# Patient Record
Sex: Male | Born: 1935 | Race: Black or African American | Hispanic: No | Marital: Married | Smoking: Never smoker
Health system: Southern US, Community
[De-identification: ages and names within clinical notes are randomized; demographics above are authoritative.]

## PROBLEM LIST (undated history)

## (undated) DIAGNOSIS — J189 Pneumonia, unspecified organism: Secondary | ICD-10-CM

## (undated) DIAGNOSIS — H9190 Unspecified hearing loss, unspecified ear: Secondary | ICD-10-CM

## (undated) DIAGNOSIS — I214 Non-ST elevation (NSTEMI) myocardial infarction: Secondary | ICD-10-CM

## (undated) DIAGNOSIS — I2510 Atherosclerotic heart disease of native coronary artery without angina pectoris: Secondary | ICD-10-CM

## (undated) DIAGNOSIS — K5792 Diverticulitis of intestine, part unspecified, without perforation or abscess without bleeding: Secondary | ICD-10-CM

## (undated) DIAGNOSIS — I10 Essential (primary) hypertension: Secondary | ICD-10-CM

## (undated) DIAGNOSIS — E876 Hypokalemia: Secondary | ICD-10-CM

## (undated) DIAGNOSIS — F0280 Dementia in other diseases classified elsewhere without behavioral disturbance: Secondary | ICD-10-CM

## (undated) DIAGNOSIS — G9340 Encephalopathy, unspecified: Secondary | ICD-10-CM

## (undated) HISTORY — PX: ANGIOPLASTY: SHX39

## (undated) HISTORY — DX: Encephalopathy, unspecified: G93.40

## (undated) HISTORY — PX: TONSILLECTOMY: SUR1361

## (undated) HISTORY — PX: OTHER SURGICAL HISTORY: SHX169

## (undated) HISTORY — DX: Pneumonia, unspecified organism: J18.9

## (undated) HISTORY — DX: Hypokalemia: E87.6

---

## 1998-02-11 ENCOUNTER — Ambulatory Visit (HOSPITAL_COMMUNITY): Admission: RE | Admit: 1998-02-11 | Discharge: 1998-02-11 | Payer: Self-pay | Admitting: Cardiology

## 1998-04-12 ENCOUNTER — Emergency Department (HOSPITAL_COMMUNITY): Admission: EM | Admit: 1998-04-12 | Discharge: 1998-04-12 | Payer: Self-pay

## 1998-05-06 ENCOUNTER — Ambulatory Visit (HOSPITAL_COMMUNITY): Admission: RE | Admit: 1998-05-06 | Discharge: 1998-05-06 | Payer: Self-pay | Admitting: Cardiology

## 1999-10-18 ENCOUNTER — Ambulatory Visit (HOSPITAL_COMMUNITY): Admission: RE | Admit: 1999-10-18 | Discharge: 1999-10-18 | Payer: Self-pay | Admitting: Cardiology

## 1999-10-18 ENCOUNTER — Encounter: Payer: Self-pay | Admitting: Cardiology

## 2002-01-17 ENCOUNTER — Encounter: Admission: RE | Admit: 2002-01-17 | Discharge: 2002-01-17 | Payer: Self-pay | Admitting: Cardiology

## 2002-01-17 ENCOUNTER — Encounter: Payer: Self-pay | Admitting: Cardiology

## 2002-02-25 ENCOUNTER — Encounter: Payer: Self-pay | Admitting: Cardiology

## 2002-02-25 ENCOUNTER — Ambulatory Visit (HOSPITAL_COMMUNITY): Admission: RE | Admit: 2002-02-25 | Discharge: 2002-02-25 | Payer: Self-pay | Admitting: Cardiology

## 2002-02-28 ENCOUNTER — Inpatient Hospital Stay (HOSPITAL_COMMUNITY): Admission: AD | Admit: 2002-02-28 | Discharge: 2002-03-02 | Payer: Self-pay | Admitting: Cardiology

## 2002-03-25 ENCOUNTER — Encounter (HOSPITAL_COMMUNITY): Admission: RE | Admit: 2002-03-25 | Discharge: 2002-05-25 | Payer: Self-pay | Admitting: Cardiology

## 2004-10-12 ENCOUNTER — Ambulatory Visit (HOSPITAL_COMMUNITY): Admission: RE | Admit: 2004-10-12 | Discharge: 2004-10-12 | Payer: Self-pay | Admitting: Cardiology

## 2005-05-10 ENCOUNTER — Emergency Department (HOSPITAL_COMMUNITY): Admission: EM | Admit: 2005-05-10 | Discharge: 2005-05-10 | Payer: Self-pay | Admitting: Emergency Medicine

## 2005-07-31 ENCOUNTER — Emergency Department (HOSPITAL_COMMUNITY): Admission: EM | Admit: 2005-07-31 | Discharge: 2005-07-31 | Payer: Self-pay | Admitting: Family Medicine

## 2008-01-28 ENCOUNTER — Ambulatory Visit (HOSPITAL_COMMUNITY): Admission: RE | Admit: 2008-01-28 | Discharge: 2008-01-28 | Payer: Self-pay | Admitting: Cardiology

## 2008-06-26 ENCOUNTER — Emergency Department (HOSPITAL_COMMUNITY): Admission: EM | Admit: 2008-06-26 | Discharge: 2008-06-26 | Payer: Self-pay | Admitting: Unknown Physician Specialty

## 2010-06-22 ENCOUNTER — Ambulatory Visit: Admit: 2010-06-22 | Payer: Self-pay | Admitting: Cardiology

## 2010-08-01 ENCOUNTER — Encounter: Payer: Self-pay | Admitting: Otolaryngology

## 2010-11-26 NOTE — Cardiovascular Report (Signed)
NAME:  Alexander Donaldson, Alexander Donaldson                         ACCOUNT NO.:  [id number]   MEDICAL RECORD NO.:  [id number]                   PATIENT TYPE:  OIB   LOCATION:  2034                                 FACILITY:  [facility]   PHYSICIAN:  Robynn Pane, M.D.               DATE OF BIRTH:  February 07, 1934   DATE OF PROCEDURE:  02/28/2002  DATE OF DISCHARGE:  03/02/2002                              CARDIAC CATHETERIZATION   PROCEDURE:  1. Left cardiac catheterization with selective left and right coronary     angiography, left ventriculography angiography via right groin using     Judkins technique.  2. Successful percutaneous transluminal coronary angioplasty to obtuse     marginal #3 using 2.5 x 10-mm long CrossSail balloon.  3. Successful deployment of 3.0 x 13-mm long CYPHER drug-eluting stent in     proximal obtuse marginal #3.  4. Successful percutaneous transluminal coronary angioplasty to mid and     distal junction and distal right coronary artery using 1.5 x 15-mm long     Maverick balloon and then 2.5 x 20-mm long CrossSail balloon for     predilatation.  5. Successful deployment of 2.5 x 23-mm long CYPHER drug-eluting stent in     distal right coronary artery.  6. Successful deployment of 3.0 x 23-mm long CYPHER drug-eluting stent in     mid and distal junction of right coronary artery.   INDICATIONS FOR PROCEDURE:  The patient is a 77 year old black male with a  past medical history significant for hypertension, diabetes mellitus,  hypercholesterolemia, complains of occasional palpitations and feeling weak  and tired.  The patient denies any chest pain but complains of exertional  dyspnea and states had chest pain approximately two years ago and  subsequently had stress test which was normal.  The patient underwent stress  Cardiolite on February 25, 2002.  He exercised for 6.45 minutes on Bruce  protocol, complained of fatigue and shortness of breath.  EKG showed up to 2  mm ST depression  in inferolateral leads which reverted to a baseline after  sublingual nitroglycerin and IV Lopressor.  Cardiolite scan showed focal  ischemia in the inferoapical wall with scar in the inferior wall with EF of  40%.  Due to exertional dyspnea, weakness which was probably anginal  equivalent, and strongly positive stress Cardiolite, the patient was advised  for left catheterization, possible PTCA stenting.   DESCRIPTION OF PROCEDURE:  After obtaining the informed consent, the patient  was brought to the catheterization lab and was placed on the fluoroscopy  table.  The right groin was prepped and draped in the usual fashion.  Xylocaine, 2%, was used for local anesthesia in the right groin.  With the  help of a thin-walled needle, a #6 French arterial sheath was placed.  The  sheath was aspirated and flushed.  Next a #6 French left Judkins catheter  was advanced over  the wire under fluoroscopic guidance up to the ascending  aorta.  The wire was pulled out.  The catheter was aspirated and connected  to the manifold.  The catheter was further advanced and engaged into the  left coronary ostium.  Multiple views of the left system were taken.  Next  the catheter was disengaged and was pulled out over the wire and was  replaced with a #6 [place] right Judkins catheter which was advanced over the  wire under fluoroscopic guidance up to the ascending aorta.  The wire was  pulled out.  The catheter was aspirated and connected to the manifold.  The  catheter was further advanced and engaged into the right coronary ostium.  Multiple views of the right system were taken.  Next the catheter was  disengaged and was pulled out over the wire and was replaced with a #6  French pigtail catheter which was advanced over the wire under fluoroscopic  guidance up to the ascending aorta.  The wire was pulled out.  The catheter  was aspirated and connected to the manifold.  The catheter was further  advanced across  the aortic valve into the LV.  LV pressures were recorded.  Next, left ventriculography was done in the 30-degree RAO position.  Post  angiographic pressures were recorded from LV and then pullback pressures  were recorded from the aorta.  There was no gradient across the aortic  valve.  Next the pigtail catheter was pulled out over the wire.  Sheaths  were aspirated and flushed.    FINDINGS:  1. Left ventricle showed inferobasal wall hypokinesia, EF of 40-45%.  2. The left main has 15-20% distal stenosis.  3. The LAD has 50-60% proximal stenosis.  Diagonal #1 has 50-60% proximal     stenosis and 50-60% mid stenosis in the inferior branch of diagonal #1.  4. The left circumflex is patent which makes more than a 90-degree turn     arising from the left main.  OM-1 and OM-2 are very small which are     patent.  OM-3 has 85-90% proximal focal stenosis.  The RCA was 100%     occluded which was filling by collaterals from the left system.   INTERVENTIONAL PROCEDURE:  Successful PTCA to OM-3 was done using 2.5 x 10-  mm long CrossSail balloon for predilatation and then 3.0 x 13-mm long CYPHER  drug-eluting stent was deployed at 11 atmospheres of pressure.  The lesion  was dilated from 85-90% to 0% residual with excellent TIMI grade 3 distal  flow without evidence of dissection or distal embolization.  Successful PTCA  to mid and distal junction and distal RCA was done using initially, 1.5 x 15-  mm long Maverick balloon, and then 2.0 x 20-mm long CrossSail balloon for  predilatation, and then 2.5 x 23-mm long CYPHER drug-eluting stent was  deployed at 11 atmospheres of pressure in distal RCA which was fully  expanded going up to 15 atmospheres of pressure and then 3.0 x 23-mm long  CYPHER drug-eluting stent was deployed at 11 atmospheres of pressure in mid  and distal junction of RCA successfully expanded, going up to 13 atmospheres of pressure.  The lesion was dilated from 100% to 0%  residual with excellent  TIMI grade 3 distal flow without evidence of dissection or distal  embolization.  The patient received weight-based heparin, ReoPro,  __________, 300 mg of Plavix during the procedure.  The patient tolerated  the procedure well.  There were no complications.  The patient was  transferred to recovery room in stable condition.                                               Robynn Pane, M.D.    MNH/MEDQ  D:  02/28/2002  T:  03/03/2002  Job:  [id number]   cc:   Cardiac Catheterization Lab   Osvaldo Shipper. Spruill, M.D.

## 2010-11-26 NOTE — Discharge Summary (Signed)
NAMEMELDON, HANZLIK                         ACCOUNT NO.:  [id number]   MEDICAL RECORD NO.:  [id number]                   PATIENT TYPE:  INP   LOCATION:  2034                                 FACILITY:  [facility]   PHYSICIAN:  Robynn Pane, M.D.               DATE OF BIRTH:  [birth year]   DATE OF ADMISSION:  02/28/2002  DATE OF DISCHARGE:  03/02/2002                                 DISCHARGE SUMMARY   ADMISSION DIAGNOSES:  1. Exertional dyspnea, weakness, probably angina equivalent, positive stress     Cardiolite.  2. Uncontrolled hypertension.  3. Non-insulin-dependent diabetes mellitus.   DISCHARGE DIAGNOSES:  1. New onset angina, positive stress Cardiolite.  2. Three vessel cardiovascular disease, status post percutaneous     transluminal coronary angioplasty and stenting to obtuse marginal III and     percutaneous transluminal coronary angioplasty and stenting to 100%     occluded right coronary artery.  3. Hypertension.  4. Non-insulin-dependent diabetes mellitus.  5. Remote history of tobacco abuse.  6. Status post asymptomatic pauses secondary to beta-blockers.   DISCHARGE MEDICATIONS:  1. Enteric coated aspirin 325 mg one tablet daily.  2. Plavix 75 mg one tablet daily with food.  3. Altace 5 mg one capsule daily.  4. Glucotrol XL 10 mg one tablet daily in the morning as before.  5. Actos 45 mg one tablet daily as before.  6. Imdur 30 mg one tablet daily in the morning.  7. Nitrostat 0.4 mg sublingual use as directed.  8. Protonix 40 mg one tablet daily.   ACTIVITY:  Avoid heavy lifting, pushing or pulling for 48 hours.   DIET:  Low salt, low cholesterol, 1800 calorie ADA diet.   DISCHARGE INSTRUCTIONS:  The patient has been advised to monitor blood sugar  as before.  Post angioplasty and stent instructions have been given.   FOLLOW UP:  The patient will follow up with me in one week and Dr. Shana Chute  in three weeks.   CONDITION ON DISCHARGE:  Stable.   HISTORY OF PRESENT ILLNESS:  Briefly, the patient is a 77 year old black  male with past medical history significant for hypertension , non-insulin-  dependent diabetes mellitus, hypercholesterolemia, complained of occasional  palpitation and feeling weak and tired.  He denies any chest pain but  complains of exertional dyspnea.  The patient had chest pain approximately  two years ago and subsequently had stress test which was normal.  The  patient underwent stress Cardiolite on February 25, 2002, exercised for 6.45  minutes on Bruce protocol, complained of fatigue and shortness of breath.  EKG showed up to 2 mm ST depression in inferolateral leads which reverted  towards baseline after sublingual nitroglycerin and IV Lopressor.  Cardiolite scan showed focal ischemia in the infra-apical wall with scar in  the inferior wall with EF of 40%.  Due to exertional dyspnea, weakness,  probable angina equivalent and  positive stress Cardiolite, the patient was  advised for left catheterization, possible PTCA and stenting.   PAST MEDICAL HISTORY:  As above.   PAST SURGICAL HISTORY:  None.   ALLERGIES:  No known drug allergies.   MEDICATIONS AT HOME:  1. Lotrel 5/20 p.o. q.d.  2. Enteric coated aspirin 325 mg p.o. q.d.  3. Toprol XL 50 mg p.o. q.d.  4. Actos 45 mg p.o. q.d.  5. Glucotrol XL 5 mg p.o. q.d.  6. Plavix 75 mg p.o. q.d. which was recently stopped.   SOCIAL HISTORY:  He is married.  He has six children.  He smoked one pack  per day for 16 years, quit 30+ years ago.  No history of alcohol abuse.  He  is retired Education administrator.   FAMILY HISTORY:  Grandfather died at the age of 69.  Father died of old age  at the age of 30.  He was diabetic.  Mother died of breast cancer.   PHYSICAL EXAMINATION:  GENERAL APPEARANCE:  He was awake, alert and oriented  x3 in no acute distress.  VITAL SIGNS:  Blood pressure 162/100, pulse 95 and regular.  HEENT:  Conjunctiva pink.  NECK:  Supple, no JVD, no  bruit.  LUNGS:  Clear to auscultation without rhonchi or rales.  CARDIOVASCULAR:  S1 and S2 normal. There was an S4 gallop.  ABDOMEN:  Soft, bowel sounds present, nontender.  EXTREMITIES:  No clubbing, cyanosis, or edema.   LABORATORY DATA:  EKG showed normal sinus rhythm, possible inferior infarct  age undetermined.  No change since the prior EKG.   Sodium 136, potassium 3.6, chloride 105, bicarb 26, glucose 117, BUN 12,  creatinine 0.9.  Hemoglobin 12.2, hematocrit 35.6, white count 5.5,  platelets 145.  CPK post procedure 206, MB 2.6 with relative index of 1.3.   HOSPITAL COURSE:  The patient was a.m. admit and underwent left cardiac  catheterization with selective left and right coronary angiography, left  ventriculography, and PTCA and stenting to OM III and PTCA and stenting to  RCA.  As per procedure report, the patient tolerated the procedure well.  There were no complications.  The patient was transferred to elective  angioplasty unit.  Post procedure his groin sheaths were pulled out without  any evidence of hematoma.  The patient had vasovagal episode during removal  of the sheaths and he dropped his blood pressure and heart rate which  responded to IV fluids and 0.5 amp of atropine.  The patient had two or  three episodes of pauses and nonconducted APCs and occasional Wenckebach  Mobitz type I phenomenon.  Beta-blockers were slowly weaned off. The patient  did not have further episodes of heart blocks during the hospital stay.  The  patient has been ambulating in the hallway without any problems There was no  evidence of hematoma or bruit.  Phase I cardiac rehab was called.  The  patient was discharged home in stable condition.  The patient will be  scheduled for stress Cardiolite in four to six weeks to evaluate the  significance of proximal LAD lesion.  The patient will be followed up in the office in one week and Dr. Shana Chute in three weeks.  If his stress Cardiolite   study is negative for ischemia, then the patient will be referred to phase  II cardiac rehab as an outpatient.  Robynn Pane, M.D.    MNH/MEDQ  D:  03/20/2002  T:  03/22/2002  Job:  [id number]   cc:   Osvaldo Shipper. Spruill, M.D.  [address] [address]  [place]  [place] [address]  Fax: [phone]

## 2011-06-16 ENCOUNTER — Emergency Department (HOSPITAL_COMMUNITY)
Admission: EM | Admit: 2011-06-16 | Discharge: 2011-06-16 | Disposition: A | Discharge status: Home / Self Care | Payer: Self-pay | Source: Home / Self Care | Attending: Family Medicine | Admitting: Family Medicine

## 2011-06-16 DIAGNOSIS — J40 Bronchitis, not specified as acute or chronic: Secondary | ICD-10-CM

## 2011-06-16 HISTORY — DX: Essential (primary) hypertension: I10

## 2011-06-16 MED ORDER — AZITHROMYCIN 250 MG PO TABS: 250.0000 mg | ORAL_TABLET | Freq: Every day | ORAL | Status: AC

## 2011-06-16 MED ORDER — GUAIFENESIN-CODEINE 100-10 MG/5ML PO SYRP: 5.0000 mL | ORAL_SOLUTION | Freq: Four times a day (QID) | ORAL | Status: AC | PRN

## 2011-06-16 NOTE — ED Provider Notes (Signed)
History     CSN: [id number] Arrival date & time: 06/16/2011  8:38 AM   First MD Initiated Contact with Patient 06/16/11 0902      Chief Complaint  Patient presents with  . Cough    (Consider location/radiation/quality/duration/timing/severity/associated sxs/prior treatment) Patient is a 77 y.o. male presenting with cough. The history is provided by the patient.  Cough This is a new problem. Episode onset: 2 wk agp. The cough is non-productive. There has been no fever. Associated symptoms comments: Started with runny nose and sore throat that resolved. .    Past Medical History  Diagnosis Date  . Diabetes mellitus   . Hypertension     Past Surgical History  Procedure Date  . Angioplasty     History reviewed. No pertinent family history.  History  Substance Use Topics  . Smoking status: Not on file  . Smokeless tobacco: Not on file  . Alcohol Use:       Review of Systems  Constitutional: Negative.   HENT: Negative.   Respiratory: Positive for cough.   Cardiovascular: Negative.   Gastrointestinal: Negative.   Genitourinary: Negative.   Musculoskeletal: Negative.     Allergies  Review of patient's allergies indicates no known allergies.  Home Medications   Current Outpatient Rx  Name Route Sig Dispense Refill  . AMLODIPINE BESYLATE 10 MG PO TABS Oral Take 10 mg by mouth daily.      . ASPIRIN 81 MG PO TABS Oral Take 81 mg by mouth daily.      Marland Kitchen GLIPIZIDE 10 MG PO TABS Oral Take 10 mg by mouth 2 (two) times daily before a meal.      . AZITHROMYCIN 250 MG PO TABS Oral Take 1 tablet (250 mg total) by mouth daily. Take first 2 tablets together, then 1 every day until finished. 6 tablet 0  . GUAIFENESIN-CODEINE 100-10 MG/5ML PO SYRP Oral Take 5 mLs by mouth every 6 (six) hours as needed for cough. 120 mL 0    BP 164/76  Pulse 72  Temp(Src) 97.1 F (36.2 C) (Oral)  Resp 18  SpO2 99%  Physical Exam  Nursing note and vitals reviewed. Constitutional: He  appears well-developed and well-nourished. He appears distressed.  HENT:  Head: Normocephalic.  Right Ear: External ear normal.  Left Ear: External ear normal.  Nose: Nose normal.  Neck: Normal range of motion. Neck supple.  Cardiovascular: Normal rate.   Pulmonary/Chest: Effort normal and breath sounds normal. No respiratory distress. He has no wheezes.       Congested cough  Lymphadenopathy:    He has no cervical adenopathy.  Skin: Skin is warm and dry.    ED Course  Procedures (including critical care time)  Labs Reviewed - No data to display No results found.   1. Bronchitis       MDM          Randa Spike, MD 06/16/11 [phone]

## 2011-06-16 NOTE — ED Notes (Signed)
C/o dry cough and states he thinks he hears himself wheezing at night.  States sx started with sorethroat 2 weeks ago but that has resolved, states he thinks his cough is better.  Denies fever or bodyaches

## 2011-06-16 NOTE — ED Notes (Signed)
Per patient, can't hear from right ear and left ear only hears 80%.

## 2012-03-16 ENCOUNTER — Encounter (HOSPITAL_COMMUNITY)
Admission: EM | Disposition: A | Discharge status: Home / Self Care | Payer: Self-pay | Source: Home / Self Care | Attending: Cardiology

## 2012-03-16 ENCOUNTER — Encounter (HOSPITAL_COMMUNITY): Payer: Self-pay | Admitting: *Deleted

## 2012-03-16 ENCOUNTER — Inpatient Hospital Stay (HOSPITAL_COMMUNITY)
Admission: EM | Admit: 2012-03-16 | Discharge: 2012-03-18 | DRG: 247 | Disposition: A | Discharge status: Home / Self Care | Payer: 59 | Attending: Cardiology | Admitting: Cardiology

## 2012-03-16 ENCOUNTER — Emergency Department (HOSPITAL_COMMUNITY): Payer: 59

## 2012-03-16 DIAGNOSIS — I4729 Other ventricular tachycardia: Secondary | ICD-10-CM | POA: Diagnosis not present

## 2012-03-16 DIAGNOSIS — I214 Non-ST elevation (NSTEMI) myocardial infarction: Principal | ICD-10-CM | POA: Diagnosis present

## 2012-03-16 DIAGNOSIS — Z7902 Long term (current) use of antithrombotics/antiplatelets: Secondary | ICD-10-CM

## 2012-03-16 DIAGNOSIS — Z7982 Long term (current) use of aspirin: Secondary | ICD-10-CM

## 2012-03-16 DIAGNOSIS — Z79899 Other long term (current) drug therapy: Secondary | ICD-10-CM

## 2012-03-16 DIAGNOSIS — N529 Male erectile dysfunction, unspecified: Secondary | ICD-10-CM | POA: Diagnosis present

## 2012-03-16 DIAGNOSIS — Z87891 Personal history of nicotine dependence: Secondary | ICD-10-CM

## 2012-03-16 DIAGNOSIS — E119 Type 2 diabetes mellitus without complications: Secondary | ICD-10-CM | POA: Diagnosis present

## 2012-03-16 DIAGNOSIS — Z9861 Coronary angioplasty status: Secondary | ICD-10-CM

## 2012-03-16 DIAGNOSIS — E7800 Pure hypercholesterolemia, unspecified: Secondary | ICD-10-CM | POA: Diagnosis present

## 2012-03-16 DIAGNOSIS — I252 Old myocardial infarction: Secondary | ICD-10-CM

## 2012-03-16 DIAGNOSIS — E876 Hypokalemia: Secondary | ICD-10-CM | POA: Diagnosis not present

## 2012-03-16 DIAGNOSIS — Z955 Presence of coronary angioplasty implant and graft: Secondary | ICD-10-CM

## 2012-03-16 DIAGNOSIS — I472 Ventricular tachycardia, unspecified: Secondary | ICD-10-CM | POA: Diagnosis not present

## 2012-03-16 DIAGNOSIS — I10 Essential (primary) hypertension: Secondary | ICD-10-CM | POA: Diagnosis present

## 2012-03-16 DIAGNOSIS — R079 Chest pain, unspecified: Secondary | ICD-10-CM

## 2012-03-16 DIAGNOSIS — I2510 Atherosclerotic heart disease of native coronary artery without angina pectoris: Secondary | ICD-10-CM | POA: Diagnosis present

## 2012-03-16 HISTORY — PX: LEFT HEART CATHETERIZATION WITH CORONARY ANGIOGRAM: SHX5451

## 2012-03-16 HISTORY — DX: Atherosclerotic heart disease of native coronary artery without angina pectoris: I25.10

## 2012-03-16 HISTORY — PX: PERCUTANEOUS CORONARY STENT INTERVENTION (PCI-S): SHX5485

## 2012-03-16 LAB — TROPONIN I: Troponin I: 4.01 ng/mL (ref <0.30)

## 2012-03-16 LAB — CBC WITH DIFFERENTIAL/PLATELET
Basophils Absolute: 0 10*3/uL (ref 0.0–0.1)
Basophils Relative: 0 % (ref 0–1)
Eosinophils Absolute: 0.1 10*3/uL (ref 0.0–0.7)
Eosinophils Relative: 3 % (ref 0–5)
Eosinophils Relative: 2 % (ref 0–5)
HCT: 41.3 % (ref 39.0–52.0)
HCT: 43.2 % (ref 39.0–52.0)
Hemoglobin: 15.2 g/dL (ref 13.0–17.0)
Hemoglobin: 15.7 g/dL (ref 13.0–17.0)
Lymphocytes Relative: 32 % (ref 12–46)
Lymphocytes Relative: 31 % (ref 12–46)
Lymphs Abs: 1.4 10*3/uL (ref 0.7–4.0)
Lymphs Abs: 2.2 10*3/uL (ref 0.7–4.0)
MCV: 82.8 fL (ref 78.0–100.0)
MCV: 82.8 fL (ref 78.0–100.0)
Monocytes Absolute: 0.5 10*3/uL (ref 0.1–1.0)
Monocytes Relative: 9 % (ref 3–12)
Neutro Abs: 2.6 10*3/uL (ref 1.7–7.7)
Neutro Abs: 4.4 10*3/uL (ref 1.7–7.7)
Neutrophils Relative %: 56 % (ref 43–77)
Platelets: 173 10*3/uL (ref 150–400)
RBC: 4.99 MIL/uL (ref 4.22–5.81)
RBC: 5.22 MIL/uL (ref 4.22–5.81)
WBC: 4.5 10*3/uL (ref 4.0–10.5)
WBC: 7.2 10*3/uL (ref 4.0–10.5)

## 2012-03-16 LAB — COMPREHENSIVE METABOLIC PANEL
ALT: 19 U/L (ref 0–53)
AST: 25 U/L (ref 0–37)
AST: 33 U/L (ref 0–37)
Albumin: 4 g/dL (ref 3.5–5.2)
Alkaline Phosphatase: 71 U/L (ref 39–117)
Alkaline Phosphatase: 72 U/L (ref 39–117)
BUN: 12 mg/dL (ref 6–23)
BUN: 8 mg/dL (ref 6–23)
CO2: 28 mEq/L (ref 19–32)
CO2: 26 mEq/L (ref 19–32)
Calcium: 8.9 mg/dL (ref 8.4–10.5)
Chloride: 97 mEq/L (ref 96–112)
Chloride: 100 mEq/L (ref 96–112)
Creatinine, Ser: 0.72 mg/dL (ref 0.50–1.35)
Creatinine, Ser: 0.66 mg/dL (ref 0.50–1.35)
GFR calc Af Amer: >90 mL/min (ref >90)
GFR calc non Af Amer: 88 mL/min — ABNORMAL LOW (ref >90)
GFR calc non Af Amer: >90 mL/min (ref >90)
Glucose, Bld: 260 mg/dL — ABNORMAL HIGH (ref 70–99)
Potassium: 3.7 mEq/L (ref 3.5–5.1)
Potassium: 3 mEq/L — ABNORMAL LOW (ref 3.5–5.1)
Sodium: 136 mEq/L (ref 135–145)
Total Bilirubin: 0.9 mg/dL (ref 0.3–1.2)
Total Bilirubin: 1.1 mg/dL (ref 0.3–1.2)

## 2012-03-16 LAB — GLUCOSE, CAPILLARY
Glucose-Capillary: 251 mg/dL — ABNORMAL HIGH (ref 70–99)
Glucose-Capillary: 190 mg/dL — ABNORMAL HIGH (ref 70–99)

## 2012-03-16 LAB — POCT I-STAT TROPONIN I: Troponin i, poc: 2.25 ng/mL (ref 0.00–0.08)

## 2012-03-16 LAB — APTT: aPTT: 38 seconds — ABNORMAL HIGH (ref 24–37)

## 2012-03-16 LAB — PROTIME-INR: INR: 0.96 (ref 0.00–1.49)

## 2012-03-16 SURGERY — LEFT HEART CATHETERIZATION WITH CORONARY ANGIOGRAM
Anesthesia: LOCAL

## 2012-03-16 MED ORDER — SODIUM CHLORIDE 0.9 % IV SOLN: INTRAVENOUS | Status: DC

## 2012-03-16 MED ORDER — EPTIFIBATIDE 75 MG/100ML IV SOLN
2.0000 ug/kg/min | INTRAVENOUS | Status: AC
  Administered 2012-03-16 – 2012-03-17 (×2): 2 ug/kg/min via INTRAVENOUS
  Filled 2012-03-16 (×3): qty 100

## 2012-03-16 MED ORDER — ACETAMINOPHEN 325 MG PO TABS: 650.0000 mg | ORAL_TABLET | ORAL | Status: DC | PRN

## 2012-03-16 MED ORDER — ONDANSETRON HCL 4 MG/2ML IJ SOLN
4.0000 mg | Freq: Once | INTRAMUSCULAR | Status: DC
  Filled 2012-03-16: qty 2

## 2012-03-16 MED ORDER — ASPIRIN 81 MG PO CHEW: 324.0000 mg | CHEWABLE_TABLET | ORAL | Status: DC

## 2012-03-16 MED ORDER — PANTOPRAZOLE SODIUM 40 MG PO TBEC
40.0000 mg | DELAYED_RELEASE_TABLET | Freq: Every day | ORAL | Status: DC
  Administered 2012-03-16 – 2012-03-18 (×3): 40 mg via ORAL
  Filled 2012-03-16 (×3): qty 1

## 2012-03-16 MED ORDER — NITROGLYCERIN 0.2 MG/ML ON CALL CATH LAB
INTRAVENOUS | Status: AC
  Filled 2012-03-16: qty 1

## 2012-03-16 MED ORDER — TICAGRELOR 90 MG PO TABS
180.0000 mg | ORAL_TABLET | Freq: Once | ORAL | Status: AC
  Administered 2012-03-16: 180 mg via ORAL
  Filled 2012-03-16: qty 2

## 2012-03-16 MED ORDER — ASPIRIN EC 81 MG PO TBEC: 81.0000 mg | DELAYED_RELEASE_TABLET | Freq: Every day | ORAL | Status: DC

## 2012-03-16 MED ORDER — BIVALIRUDIN 250 MG IV SOLR
INTRAVENOUS | Status: AC
  Filled 2012-03-16: qty 250

## 2012-03-16 MED ORDER — HEPARIN (PORCINE) IN NACL 100-0.45 UNIT/ML-% IJ SOLN
1000.0000 [IU]/h | INTRAMUSCULAR | Status: DC
  Administered 2012-03-16: 1000 [IU]/h via INTRAVENOUS
  Filled 2012-03-16: qty 250

## 2012-03-16 MED ORDER — SODIUM CHLORIDE 0.9 % IJ SOLN
3.0000 mL | Freq: Two times a day (BID) | INTRAMUSCULAR | Status: DC
  Administered 2012-03-16: 3 mL via INTRAVENOUS

## 2012-03-16 MED ORDER — HEPARIN BOLUS VIA INFUSION
4000.0000 [IU] | Freq: Once | INTRAVENOUS | Status: AC
  Administered 2012-03-16: 4000 [IU] via INTRAVENOUS

## 2012-03-16 MED ORDER — ATROPINE SULFATE 1 MG/ML IJ SOLN
INTRAMUSCULAR | Status: AC
  Filled 2012-03-16: qty 1

## 2012-03-16 MED ORDER — MIDAZOLAM HCL 2 MG/2ML IJ SOLN
INTRAMUSCULAR | Status: AC
  Filled 2012-03-16: qty 2

## 2012-03-16 MED ORDER — SODIUM CHLORIDE 0.9 % IV SOLN: 250.0000 mL | INTRAVENOUS | Status: DC | PRN

## 2012-03-16 MED ORDER — ASPIRIN 81 MG PO CHEW
324.0000 mg | CHEWABLE_TABLET | Freq: Once | ORAL | Status: AC
  Administered 2012-03-16: 243 mg via ORAL
  Filled 2012-03-16: qty 4

## 2012-03-16 MED ORDER — EPTIFIBATIDE 75 MG/100ML IV SOLN
INTRAVENOUS | Status: AC
  Filled 2012-03-16: qty 100

## 2012-03-16 MED ORDER — METOPROLOL TARTRATE 12.5 MG HALF TABLET
12.5000 mg | ORAL_TABLET | Freq: Two times a day (BID) | ORAL | Status: DC
  Administered 2012-03-16 – 2012-03-17 (×3): 12.5 mg via ORAL
  Filled 2012-03-16 (×4): qty 1

## 2012-03-16 MED ORDER — FENTANYL CITRATE 0.05 MG/ML IJ SOLN
INTRAMUSCULAR | Status: AC
  Filled 2012-03-16: qty 2

## 2012-03-16 MED ORDER — CEFAZOLIN SODIUM 1-5 GM-% IV SOLN
1.0000 g | Freq: Once | INTRAVENOUS | Status: AC
  Administered 2012-03-16: 1 g via INTRAVENOUS
  Filled 2012-03-16: qty 50

## 2012-03-16 MED ORDER — MORPHINE SULFATE 4 MG/ML IJ SOLN
4.0000 mg | Freq: Once | INTRAMUSCULAR | Status: DC
  Filled 2012-03-16: qty 1

## 2012-03-16 MED ORDER — NITROGLYCERIN 0.4 MG SL SUBL: 0.4000 mg | SUBLINGUAL_TABLET | SUBLINGUAL | Status: DC | PRN

## 2012-03-16 MED ORDER — LIDOCAINE HCL (PF) 1 % IJ SOLN
INTRAMUSCULAR | Status: AC
  Filled 2012-03-16: qty 30

## 2012-03-16 MED ORDER — SODIUM CHLORIDE 0.9 % IJ SOLN: 3.0000 mL | INTRAMUSCULAR | Status: DC | PRN

## 2012-03-16 MED ORDER — SODIUM CHLORIDE 0.9 % IV SOLN
INTRAVENOUS | Status: DC
  Administered 2012-03-16 (×2): via INTRAVENOUS
  Administered 2012-03-17 (×2): 50 mL/h via INTRAVENOUS

## 2012-03-16 MED ORDER — POTASSIUM CHLORIDE IN NACL 20-0.9 MEQ/L-% IV SOLN
INTRAVENOUS | Status: AC
  Administered 2012-03-16: 125 mL/h via INTRAVENOUS
  Filled 2012-03-16 (×2): qty 1000

## 2012-03-16 MED ORDER — TICAGRELOR 90 MG PO TABS
90.0000 mg | ORAL_TABLET | Freq: Two times a day (BID) | ORAL | Status: DC
  Administered 2012-03-16 – 2012-03-18 (×4): 90 mg via ORAL
  Filled 2012-03-16 (×5): qty 1

## 2012-03-16 MED ORDER — ONDANSETRON HCL 4 MG/2ML IJ SOLN: 4.0000 mg | Freq: Four times a day (QID) | INTRAMUSCULAR | Status: DC | PRN

## 2012-03-16 MED ORDER — ASPIRIN 81 MG PO CHEW
81.0000 mg | CHEWABLE_TABLET | Freq: Every day | ORAL | Status: DC
  Administered 2012-03-17 – 2012-03-18 (×2): 81 mg via ORAL
  Filled 2012-03-16 (×2): qty 1

## 2012-03-16 MED ORDER — ATORVASTATIN CALCIUM 80 MG PO TABS
80.0000 mg | ORAL_TABLET | Freq: Every day | ORAL | Status: DC
  Administered 2012-03-17: 80 mg via ORAL
  Filled 2012-03-16 (×3): qty 1

## 2012-03-16 MED ORDER — LIDOCAINE-EPINEPHRINE 1 %-1:100000 IJ SOLN
INTRAMUSCULAR | Status: AC
  Filled 2012-03-16: qty 1

## 2012-03-16 MED ORDER — POTASSIUM CHLORIDE CRYS ER 20 MEQ PO TBCR
40.0000 meq | EXTENDED_RELEASE_TABLET | Freq: Once | ORAL | Status: AC
  Administered 2012-03-16: 40 meq via ORAL
  Filled 2012-03-16: qty 2

## 2012-03-16 MED ORDER — SODIUM CHLORIDE 0.9 % IV SOLN
Freq: Once | INTRAVENOUS | Status: AC
  Administered 2012-03-16: 09:00:00 via INTRAVENOUS

## 2012-03-16 MED ORDER — INSULIN ASPART 100 UNIT/ML ~~LOC~~ SOLN
0.0000 [IU] | Freq: Three times a day (TID) | SUBCUTANEOUS | Status: DC
  Administered 2012-03-17: 2 [IU] via SUBCUTANEOUS
  Administered 2012-03-17: 3 [IU] via SUBCUTANEOUS
  Administered 2012-03-17 – 2012-03-18 (×2): 2 [IU] via SUBCUTANEOUS

## 2012-03-16 MED ORDER — HEPARIN (PORCINE) IN NACL 2-0.9 UNIT/ML-% IJ SOLN
INTRAMUSCULAR | Status: AC
  Filled 2012-03-16: qty 2000

## 2012-03-16 MED ORDER — ASPIRIN 300 MG RE SUPP: 300.0000 mg | RECTAL | Status: DC

## 2012-03-16 MED ORDER — SODIUM CHLORIDE 0.9 % IV SOLN
1.7500 mg/kg/h | INTRAVENOUS | Status: AC
  Administered 2012-03-16: 1.75 mg/kg/h via INTRAVENOUS
  Filled 2012-03-16: qty 250

## 2012-03-16 MED ORDER — HEPARIN (PORCINE) IN NACL 2-0.9 UNIT/ML-% IJ SOLN
INTRAMUSCULAR | Status: AC
  Filled 2012-03-16: qty 1000

## 2012-03-16 NOTE — ED Notes (Signed)
After hanging H

## 2012-03-16 NOTE — Care Management Note (Signed)
    Page 1 of 1   03/16/2012     2:57:48 PM   CARE MANAGEMENT NOTE 03/16/2012  Patient:  PAULO, KEIMIG   Account Number:  [id number]  Date Initiated:  03/16/2012  Documentation initiated by:  Junius Creamer  Subjective/Objective Assessment:   adm w mi     Action/Plan:   lives w wife, pcp dr j spruill   Anticipated DC Date:     Anticipated DC Plan:        DC Planning Services  CM consult      Choice offered to / List presented to:             Status of service:   Medicare Important Message given?   (If response is "NO", the following Medicare IM given date fields will be blank) Date Medicare IM given:   Date Additional Medicare IM given:    Discharge Disposition:    Per UR Regulation:  Reviewed for med. necessity/level of care/duration of stay  If discussed at Long Length of Stay Meetings, dates discussed:    Comments:  9/6 14:00 debbie Jentry Mcqueary rn,bsn [phone] pt has 25.00 pe rmonth copay for brlinta. gave pt 30days free brilinta card and copay card that may assist w copay.

## 2012-03-16 NOTE — ED Notes (Signed)
CBG 251 

## 2012-03-16 NOTE — ED Notes (Signed)
Heparin gtt and bolus verified by Rubye Oaks. RN

## 2012-03-16 NOTE — ED Notes (Signed)
Pt took viagra at 5am; developed chest pain/pressure/tightness at 630; felt "different"; felt like having skipped beats; felt hot

## 2012-03-16 NOTE — ED Notes (Signed)
After hanging heparin gtt as ordered, pt's wife was concerned d/t "too many thinners."  Pt made aware that the pharmacy based the dose with his ht and wt and that this is the standard order for cp.

## 2012-03-16 NOTE — CV Procedure (Signed)
Left cardiac cath/PTCA stenting report dictated on 03/16/2012 dictation number is [id number]

## 2012-03-16 NOTE — Progress Notes (Signed)
ANTICOAGULATION CONSULT NOTE - Initial Consult  Pharmacy Consult for Heparin Indication: Chest Pain / ACS / STEMI  No Known Allergies  Patient Measurements: Height: 5\' 5"  (165.1 cm) Weight: 178 lb (80.74 kg) IBW/kg (Calculated) : 61.5  Heparin Dosing Weight:78kg  Vital Signs: Temp: 98.6 F (37 C) (09/06 0719) Temp src: Oral (09/06 0719) BP: 148/65 mmHg (09/06 0719) Pulse Rate: 85  (09/06 0719)  Labs:  Basename 03/16/12 0740  HGB 15.2  HCT 41.3  PLT 173  APTT 38*  LABPROT 13.0  INR 0.96  HEPARINUNFRC --  CREATININE --  CKTOTAL --  CKMB --  TROPONINI --    CrCl is unknown because no creatinine reading has been taken.   Medical History: Past Medical History  Diagnosis Date  . Diabetes mellitus   . Hypertension   . Coronary artery disease     Medications:  Scheduled:    . aspirin  324 mg Oral Once  .  morphine injection  4 mg Intravenous Once  . ondansetron (ZOFRAN) IV  4 mg Intravenous Once   Infusions:   PRN:   Assessment: 78 yo M in ER with complaints of chest pain/pressure/tightness. To start IV heparin for R/O ACS/STEMI. Per patient report, he does not take any warfarin, Lovenox, Plavix, or any other blood thinner at home (except Aspirin 81mg  PO daily). Of note, patient took Viagra at 5am today. Patient reports he has not taken and nitroglycerin at home, nor has any been ordered for him here.  Goal of Therapy:  Heparin level 0.3-0.7 units/ml Monitor platelets by anticoagulation protocol: Yes   Plan:  1) STAT baseline labs: PTT, PT/INR, CBC 2) Once labs have been drawn, heparin bolus 4000 units IV x1 3) Heparin infusion to start at 1000 units/hr (~13 units/kg/hr) 4) Heparin level 8 hours after infusion started. 5) Daily heparin level and CBC  Darrol Angel, PharmD Pager: [phone] 03/16/2012,8:07 AM

## 2012-03-16 NOTE — ED Provider Notes (Cosign Needed)
History     CSN: [id number]  Arrival date & time 03/16/12  0708   First MD Initiated Contact with Patient 03/16/12 [id number]      Chief Complaint  Patient presents with  . Chest Pain    (Consider location/radiation/quality/duration/timing/severity/associated sxs/prior treatment) HPI  Patient reports he used Viagra about 5 this morning. About 6:30 this morning he started having a central chest discomfort that he states is "uncomfortable". He states it was not sharp and was a type of pressure pain. He denies nausea, vomiting, diaphoresis, shortness of breath, or radiation of the pain. He states the pain did not get worse when he walked out to the car to come to the ED. He states he took one baby aspirin at home. He states this is the first time he has had chest pain since he had 3 stents placed in 2003 by Dr. Sharyn Lull. He states his pain at its worst today was a 5 or 6 and currently is a 3-4.  PCP [facility] Cardiologist Dr Sharyn Lull  Past Medical History  Diagnosis Date  . Diabetes mellitus   . Hypertension   . Coronary artery disease     Past Surgical History  Procedure Date  . Angioplasty with stent x 3 in 2003     No family history on file.  History  Substance Use Topics  . Smoking status: Never Smoker   . Smokeless tobacco: Not on file  . Alcohol Use: Yes     social   Lives at home Lives with spouse  Review of Systems  All other systems reviewed and are negative.    Allergies  Review of patient's allergies indicates no known allergies.  Home Medications   Current Outpatient Rx  Name Route Sig Dispense Refill  . AMLODIPINE BESYLATE 10 MG PO TABS Oral Take 10 mg by mouth daily.      . ASPIRIN 81 MG PO TABS Oral Take 81 mg by mouth daily.      Marland Kitchen GLIPIZIDE ER 10 MG PO TB24 Oral Take 10 mg by mouth 2 (two) times daily.    . ADULT MULTIVITAMIN W/MINERALS CH Oral Take 1 tablet by mouth daily.    Marland Kitchen NAPHAZOLINE-GLYCERIN 0.012-0.2 % OP SOLN Both Eyes Place 1-2 drops into both  eyes daily.    Marland Kitchen SILDENAFIL CITRATE 100 MG PO TABS Oral Take 50 mg by mouth daily as needed. For ED      BP 148/65  Pulse 85  Temp 98.6 F (37 C) (Oral)  Resp 18  Ht 5\' 5"  (1.651 m)  Wt 178 lb (80.74 kg)  BMI 29.62 kg/m2  SpO2 99%  Vital signs normal    Physical Exam  Nursing note and vitals reviewed. Constitutional: He is oriented to person, place, and time. He appears well-developed and well-nourished.  Non-toxic appearance. He does not appear ill. No distress.  HENT:  Head: Normocephalic and atraumatic.  Right Ear: External ear normal.  Left Ear: External ear normal.  Nose: Nose normal. No mucosal edema or rhinorrhea.  Mouth/Throat: Oropharynx is clear and moist and mucous membranes are normal. No dental abscesses or uvula swelling.  Eyes: Conjunctivae and EOM are normal. Pupils are equal, round, and reactive to light.  Neck: Normal range of motion and full passive range of motion without pain. Neck supple.  Cardiovascular: Normal rate, regular rhythm and normal heart sounds.  Exam reveals no gallop and no friction rub.   No murmur heard. Pulmonary/Chest: Effort normal and breath sounds normal. No respiratory  distress. He has no wheezes. He has no rhonchi. He has no rales. He exhibits no tenderness and no crepitus.  Abdominal: Soft. Normal appearance and bowel sounds are normal. He exhibits no distension. There is no tenderness. There is no rebound and no guarding.  Musculoskeletal: Normal range of motion. He exhibits no edema and no tenderness.       Moves all extremities well.   Neurological: He is alert and oriented to person, place, and time. He has normal strength. No cranial nerve deficit.  Skin: Skin is warm, dry and intact. No rash noted. No erythema. No pallor.  Psychiatric: He has a normal mood and affect. His speech is normal and behavior is normal. His mood appears not anxious.    ED Course  Procedures (including critical care time)   Medications  morphine 4  MG/ML injection 4 mg (0 mg Intravenous Hold 03/16/12 0813)  ondansetron (ZOFRAN) injection 4 mg (0 mg Intravenous Hold 03/16/12 0814)  heparin ADULT infusion 100 units/mL (25000 units/250 mL) (1000 Units/hr Intravenous New Bag/Given 03/16/12 0849)  aspirin chewable tablet 324 mg (243 mg Oral Given 03/16/12 0803)  heparin bolus via infusion 4,000 Units (4000 Units Intravenous Given 03/16/12 0850)  0.9 %  sodium chloride infusion (  Intravenous New Bag/Given 03/16/12 0851)    Recheck 08:58  States his pain is gone  09:04 Dr Sharyn Lull transfer to [facility] to stepdown and he will see there  11:23 Second troponin is + at 2.25  11:29 Dr Sharyn Lull here in ED, given results of second troponin    Results for orders placed during the hospital encounter of 03/16/12  CBC WITH DIFFERENTIAL      Component Value Range   WBC 4.5  4.0 - 10.5 K/uL   RBC 4.99  4.22 - 5.81 MIL/uL   Hemoglobin 15.2  13.0 - 17.0 g/dL   HCT 16.1  09.6 - 04.5 %   MCV 82.8  78.0 - 100.0 fL   MCH 30.5  26.0 - 34.0 pg   MCHC 36.8 (*) 30.0 - 36.0 g/dL   RDW 40.9  81.1 - 91.4 %   Platelets 173  150 - 400 K/uL   Neutrophils Relative 56  43 - 77 %   Lymphocytes Relative 32  12 - 46 %   Monocytes Relative 9  3 - 12 %   Eosinophils Relative 3  0 - 5 %   Basophils Relative 0  0 - 1 %   Neutro Abs 2.6  1.7 - 7.7 K/uL   Lymphs Abs 1.4  0.7 - 4.0 K/uL   Monocytes Absolute 0.4  0.1 - 1.0 K/uL   Eosinophils Absolute 0.1  0.0 - 0.7 K/uL   Basophils Absolute 0.0  0.0 - 0.1 K/uL   RBC Morphology SPHEROCYTES    COMPREHENSIVE METABOLIC PANEL      Component Value Range   Sodium 136  135 - 145 mEq/L   Potassium 3.7  3.5 - 5.1 mEq/L   Chloride 97  96 - 112 mEq/L   CO2 28  19 - 32 mEq/L   Glucose, Bld 260 (*) 70 - 99 mg/dL   BUN 12  6 - 23 mg/dL   Creatinine, Ser 7.82  0.50 - 1.35 mg/dL   Calcium 8.9  8.4 - 95.6 mg/dL   Total Protein 7.1  6.0 - 8.3 g/dL   Albumin 4.0  3.5 - 5.2 g/dL   AST 25  0 - 37 U/L   ALT 19  0 -  53 U/L   Alkaline Phosphatase  71  39 - 117 U/L   Total Bilirubin 0.9  0.3 - 1.2 mg/dL   GFR calc non Af Amer 88 (*) >90 mL/min   GFR calc Af Amer >90  >90 mL/min  APTT      Component Value Range   aPTT 38 (*) 24 - 37 seconds  PROTIME-INR      Component Value Range   Prothrombin Time 13.0  11.6 - 15.2 seconds   INR 0.96  0.00 - 1.49  POCT I-STAT TROPONIN I      Component Value Range   Troponin i, poc 0.03  0.00 - 0.08 ng/mL   Comment 3            Laboratory interpretation all normal except hyperglycemia   Dg Chest Portable 1 View  03/16/2012  *RADIOLOGY REPORT*  Clinical Data: Chest pain  PORTABLE CHEST - 1 VIEW  Comparison: None.  Findings: 0740 hours. The lungs are clear without focal infiltrate, edema, pneumothorax or pleural effusion. Cardiopericardial silhouette is at upper limits of normal for size.  Degenerative changes are noted in the right shoulder. Telemetry leads overlie the chest.  IMPRESSION: No acute cardiopulmonary findings.   Original Report Authenticated By: ERIC A. MANSELL, M.D.     Date: 03/16/2012  Rate: 85  Rhythm: normal sinus rhythm  QRS Axis: left  Intervals: normal  ST/T Wave abnormalities: minimal elevation in lead III, ST flat and min depressed lateral leads  Conduction Disutrbances:none  Narrative Interpretation:   Old EKG Reviewed: changes noted from 8/22/ 2013 (see above)     1. Chest pain    Plan transfer to [facility] for admission  Devoria Albe, MD, FACEP   MDM patient has history of coronary artery disease, his last stent was 10 years ago. Patient reports chest pain today after using Viagra and having sexual intercourse with his wife. Patient was started on heparin. He was not given nitroglycerin because of his use of Viagra. He was given aspirin and he states his pain resolved. Patient being transferred to [facility] for further evaluation for suspected recurrent coronary artery disease.    CRITICAL CARE Performed by: Devoria Albe L   Total critical care time:31 min  Critical  care time was exclusive of separately billable procedures and treating other patients.  Critical care was necessary to treat or prevent imminent or life-threatening deterioration.  Critical care was time spent personally by me on the following activities: development of treatment plan with patient and/or surrogate as well as nursing, discussions with consultants, evaluation of patient's response to treatment, examination of patient, obtaining history from patient or surrogate, ordering and performing treatments and interventions, ordering and review of laboratory studies, ordering and review of radiographic studies, pulse oximetry and re-evaluation of patient's condition.         Ward Givens, MD 03/16/12 0930  Ward Givens, MD 03/16/12 [phone]

## 2012-03-16 NOTE — ED Notes (Signed)
Pt reports taking viagra at 0500 this am and started to have chest tightness at 0630.  Pt has cardiac hx.  Pt reports feeling much better at present.  Pt in  No distress.  Pt on the monitor, NSR noted.

## 2012-03-16 NOTE — Progress Notes (Signed)
ANTICOAGULATION CONSULT NOTE - Initial Consult  Pharmacy Consult for Integrilin Indication: ACS s/p PCI  No Known Allergies  Patient Measurements: Height: 5\' 5"  (165.1 cm) Weight: 176 lb 2.4 oz (79.9 kg) IBW/kg (Calculated) : 61.5   Vital Signs: Temp: 98.6 F (37 C) (09/06 0719) Temp src: Oral (09/06 0719) BP: 137/67 mmHg (09/06 1415) Pulse Rate: 83  (09/06 1606)  Labs:  Basename 03/16/12 1305 03/16/12 0740  HGB 15.7 15.2  HCT 43.2 41.3  PLT 211 173  APTT 156* 38*  LABPROT 13.2 13.0  INR 0.98 0.96  HEPARINUNFRC -- --  CREATININE -- 0.72  CKTOTAL -- --  CKMB -- --  TROPONINI 4.01* --    Estimated Creatinine Clearance: 76.6 ml/min (by C-G formula based on Cr of 0.72).   Medical History: Past Medical History  Diagnosis Date  . Diabetes mellitus   . Hypertension   . Coronary artery disease     Assessment: 89 YOM with hx of CAD presented with ACS, was initially started heparin infusion, s/p cath and stent placement. Heparin d/c'd will, Integrilin started in cath lab at 1800 and will continue for 18 hrs. Scr 0.72, est. crcl 75 ml/min, Weight = 80 kg. Hgb 15.7, plt 211    Plan:  - Continue Integrilin 52mcg/kg//min until 1200 tomorrow - f/u cbc in Am  Bayard Hugger, PharmD, BCPS  Clinical Pharmacist  Pager: [phone]  03/16/2012,7:10 PM

## 2012-03-16 NOTE — H&P (Signed)
Alexander Donaldson is an 78 y.o. male.   Chief Complaint: Chest pain HPI: Patient is 78 year old male with past medical history significant for coronary artery disease history of silent inferior wall myocardial infarction in the past status post PTCA stenting 200% occluded RCA and PTCA stenting to obtuse marginal 3 in 2003 approximately 10 years ago, hypertension, non-insulin-dependent diabetes mellitus, erectile dysfunction, remote history of tobacco abuse, came to the ER complaining of retrosternal chest pain described as pressure grade 5/10 after taking Viagra and sexual activity without any associated nausea vomiting diaphoresis. Patient denies any shortness of breath denies any palpitation denies any lightheadedness or syncope . Patient denies any prior episodes of chest pain in the past. Patient was noted to have elevated troponin I. in the ED. EKG done in the ER showed normal sinus rhythm with no significant acute ischemic changes.  Past Medical History  Diagnosis Date  . Diabetes mellitus   . Hypertension   . Coronary artery disease     Past Surgical History  Procedure Date  . Angioplasty     History reviewed. No pertinent family history. Social History:  reports that he has never smoked. He has never used smokeless tobacco. He reports that he drinks alcohol. His drug history not on file.  Allergies: No Known Allergies   (Not in a hospital admission)  Results for orders placed during the hospital encounter of 03/16/12 (from the past 48 hour(s))  CBC WITH DIFFERENTIAL     Status: Abnormal   Collection Time   03/16/12  7:40 AM      Component Value Range Comment   WBC 4.5  4.0 - 10.5 K/uL    RBC 4.99  4.22 - 5.81 MIL/uL    Hemoglobin 15.2  13.0 - 17.0 g/dL    HCT 16.1  09.6 - 04.5 %    MCV 82.8  78.0 - 100.0 fL    MCH 30.5  26.0 - 34.0 pg    MCHC 36.8 (*) 30.0 - 36.0 g/dL    RDW 40.9  81.1 - 91.4 %    Platelets 173  150 - 400 K/uL    Neutrophils Relative 56  43 - 77 %    Lymphocytes Relative 32  12 - 46 %    Monocytes Relative 9  3 - 12 %    Eosinophils Relative 3  0 - 5 %    Basophils Relative 0  0 - 1 %    Neutro Abs 2.6  1.7 - 7.7 K/uL    Lymphs Abs 1.4  0.7 - 4.0 K/uL    Monocytes Absolute 0.4  0.1 - 1.0 K/uL    Eosinophils Absolute 0.1  0.0 - 0.7 K/uL    Basophils Absolute 0.0  0.0 - 0.1 K/uL    RBC Morphology SPHEROCYTES     COMPREHENSIVE METABOLIC PANEL     Status: Abnormal   Collection Time   03/16/12  7:40 AM      Component Value Range Comment   Sodium 136  135 - 145 mEq/L    Potassium 3.7  3.5 - 5.1 mEq/L    Chloride 97  96 - 112 mEq/L    CO2 28  19 - 32 mEq/L    Glucose, Bld 260 (*) 70 - 99 mg/dL    BUN 12  6 - 23 mg/dL    Creatinine, Ser 7.82  0.50 - 1.35 mg/dL    Calcium 8.9  8.4 - 95.6 mg/dL    Total Protein 7.1  6.0 - 8.3 g/dL    Albumin 4.0  3.5 - 5.2 g/dL    AST 25  0 - 37 U/L    ALT 19  0 - 53 U/L    Alkaline Phosphatase 71  39 - 117 U/L    Total Bilirubin 0.9  0.3 - 1.2 mg/dL    GFR calc non Af Amer 88 (*) >90 mL/min    GFR calc Af Amer >90  >90 mL/min   APTT     Status: Abnormal   Collection Time   03/16/12  7:40 AM      Component Value Range Comment   aPTT 38 (*) 24 - 37 seconds   PROTIME-INR     Status: Normal   Collection Time   03/16/12  7:40 AM      Component Value Range Comment   Prothrombin Time 13.0  11.6 - 15.2 seconds    INR 0.96  0.00 - 1.49   POCT I-STAT TROPONIN I     Status: Normal   Collection Time   03/16/12  7:49 AM      Component Value Range Comment   Troponin i, poc 0.03  0.00 - 0.08 ng/mL    Comment 3            GLUCOSE, CAPILLARY     Status: Abnormal   Collection Time   03/16/12 10:03 AM      Component Value Range Comment   Glucose-Capillary 251 (*) 70 - 99 mg/dL    Comment 1 Notify RN     POCT I-STAT TROPONIN I     Status: Abnormal   Collection Time   03/16/12 11:07 AM      Component Value Range Comment   Troponin i, poc 2.25 (*) 0.00 - 0.08 ng/mL    Comment NOTIFIED PHYSICIAN      Comment 3              Dg Chest Portable 1 View  03/16/2012  *RADIOLOGY REPORT*  Clinical Data: Chest pain  PORTABLE CHEST - 1 VIEW  Comparison: None.  Findings: 0740 hours. The lungs are clear without focal infiltrate, edema, pneumothorax or pleural effusion. Cardiopericardial silhouette is at upper limits of normal for size.  Degenerative changes are noted in the right shoulder. Telemetry leads overlie the chest.  IMPRESSION: No acute cardiopulmonary findings.   Original Report Authenticated By: ERIC A. MANSELL, M.D.     Review of Systems  Constitutional: Negative for fever, chills and weight loss.  HENT: Negative for hearing loss.   Eyes: Negative for blurred vision and double vision.  Respiratory: Negative for cough, sputum production and shortness of breath.   Cardiovascular: Positive for chest pain. Negative for palpitations, orthopnea, claudication and leg swelling.  Gastrointestinal: Negative for heartburn, nausea and vomiting.  Genitourinary: Negative for dysuria.  Musculoskeletal: Negative for myalgias.  Skin: Negative for rash.  Neurological: Negative for dizziness and headaches.    Blood pressure 159/76, pulse 77, temperature 98.6 F (37 C), temperature source Oral, resp. rate 19, height 5\' 5"  (1.651 m), weight 80.74 kg (178 lb), SpO2 100.00%. Physical Exam  Constitutional: He is oriented to person, place, and time.  HENT:  Head: Normocephalic and atraumatic.  Nose: Nose normal.  Eyes: Conjunctivae and EOM are normal. Pupils are equal, round, and reactive to light. Left eye exhibits no discharge.  Neck: Normal range of motion. Neck supple. No tracheal deviation present.  Cardiovascular: Normal rate and regular rhythm.   Murmur (soft systolic murmur and  S4 gallop noted ) heard. Respiratory: Effort normal and breath sounds normal. No respiratory distress. He has no wheezes. He has no rales.  GI: Soft. Bowel sounds are normal. He exhibits no distension. There is no tenderness. There is no  rebound and no guarding.  Musculoskeletal: He exhibits no edema and no tenderness.  Neurological: He is alert and oriented to person, place, and time.     Assessment/Plan Acute non-Q-wave myocardial infarction Coronary artery disease history of silent intraoral myocardial infarction in the past status post PCI to RCA and obtuse marginal 3 in 2003 Hypertension Non-insulin-dependent diabetes mellitus Remote tobacco abuse Erectile dysfunction  Hypercholesteremia Plan Discussed with patient and his wife regarding left cardiac cath possible PTCA stenting its risk and benefits i.e. death MI stroke need for emergency CABG local vascular complications etc. and consents for PCI.  Messiah Rovira N 03/16/2012, 11:39 AM

## 2012-03-17 LAB — LIPID PANEL
Cholesterol: 212 mg/dL — ABNORMAL HIGH (ref 0–200)
HDL: 39 mg/dL — ABNORMAL LOW (ref >39)
Total CHOL/HDL Ratio: 5.4 RATIO
Triglycerides: 196 mg/dL — ABNORMAL HIGH (ref <150)

## 2012-03-17 LAB — CBC
Hemoglobin: 14.9 g/dL (ref 13.0–17.0)
MCH: 30.7 pg (ref 26.0–34.0)
MCHC: 36.6 g/dL — ABNORMAL HIGH (ref 30.0–36.0)
MCV: 83.9 fL (ref 78.0–100.0)
RBC: 4.85 MIL/uL (ref 4.22–5.81)

## 2012-03-17 LAB — MAGNESIUM: Magnesium: 1.8 mg/dL (ref 1.5–2.5)

## 2012-03-17 LAB — BASIC METABOLIC PANEL
CO2: 27 mEq/L (ref 19–32)
Calcium: 8.4 mg/dL (ref 8.4–10.5)
Creatinine, Ser: 0.67 mg/dL (ref 0.50–1.35)
GFR calc non Af Amer: >90 mL/min (ref >90)
Glucose, Bld: 176 mg/dL — ABNORMAL HIGH (ref 70–99)
Sodium: 138 mEq/L (ref 135–145)

## 2012-03-17 LAB — HEMOGLOBIN A1C
Hgb A1c MFr Bld: 8.6 % — ABNORMAL HIGH (ref <5.7)
Mean Plasma Glucose: 200 mg/dL — ABNORMAL HIGH (ref <117)

## 2012-03-17 LAB — GLUCOSE, CAPILLARY
Glucose-Capillary: 160 mg/dL — ABNORMAL HIGH (ref 70–99)
Glucose-Capillary: 148 mg/dL — ABNORMAL HIGH (ref 70–99)

## 2012-03-17 LAB — TSH: TSH: 0.954 u[IU]/mL (ref 0.350–4.500)

## 2012-03-17 MED ORDER — METOPROLOL TARTRATE 25 MG PO TABS
25.0000 mg | ORAL_TABLET | Freq: Two times a day (BID) | ORAL | Status: DC
  Administered 2012-03-17: 12.5 mg via ORAL
  Administered 2012-03-18: 25 mg via ORAL
  Filled 2012-03-17 (×2): qty 1

## 2012-03-17 MED ORDER — RAMIPRIL 2.5 MG PO CAPS
2.5000 mg | ORAL_CAPSULE | Freq: Every day | ORAL | Status: DC
  Administered 2012-03-17 – 2012-03-18 (×2): 2.5 mg via ORAL
  Filled 2012-03-17 (×2): qty 1

## 2012-03-17 NOTE — Plan of Care (Signed)
Problem: Phase III Progression Outcomes Goal: Discharge plan remains appropriate-arrangements made Outcome: Progressing Discussed limited activity; no tub baths once pt goes home; pt and pt's wife verbalize understanding

## 2012-03-17 NOTE — Progress Notes (Signed)
CARDIAC REHAB PHASE I   PRE:  Rate/Rhythm: 73 SR    BP: sitting 09811    SaO2: 98 RA  MODE:  Ambulation: 350 ft   POST:  Rate/Rhythm: 92SR    BP: sitting 91478     SaO2: 97 RA  Tolerated well, feels good. Ed completed with wife present. Requests his name be sent to [place] [facility].  [id number]  Harriet Masson CES, ACSM

## 2012-03-17 NOTE — Progress Notes (Signed)
Monitor alarms for VT(6 beat), pt conversing with son and asymptomatic. Dr. Sharyn Lull notified of this  And k=3. New orders received. Will cont to monitor closely.

## 2012-03-17 NOTE — Cardiovascular Report (Signed)
NAMEPATRIC, Alexander Donaldson NO.:  [id number]  MEDICAL RECORD NO.:  [id number]  LOCATION:  2925                         FACILITY:  [facility]  PHYSICIAN:  Eduardo Osier. Sharyn Lull, M.D. DATE OF BIRTH:  02-Dec-1933  DATE OF PROCEDURE:  03/16/2012 DATE OF DISCHARGE:                           CARDIAC CATHETERIZATION   PROCEDURE: 1. Left cardiac catheterization with selective left and right coronary     angiography, left ventriculography via right groin using Judkins     technique. 2. Successful PTCA to proximal and mid-junction of LAD using 2.5 x 12-     mm long Trek balloon. 3. Successful deployment of 3.0 x 33-mm long Xience Xpedition drug-     eluting stent in proximal and mid-junction of LAD. 4. Successful postdilatation of this stent using 3.25 x 15-mm long [place]     Trek balloon. 5. Successful PTCA to proximal left circumflex using 2.5 x 12-mm long     same Trek balloon. 6. Successful deployment of 3.5 x 23-mm long Xience Xpedition drug-     eluting stent in proximal and mid-junction of left circumflex. 7. Successful postdilatation of this stent using 3.5 x 15-mm long [place]     Trek balloon.  INDICATION FOR THE PROCEDURE:  Alexander Donaldson is a 78 year old male with past medical history significant for coronary artery disease, history of silent inferior wall myocardial infarction in the past, status post PTCA stenting to 100% occluded RCA and PTCA stenting to obtuse marginal 3 in August of 2003, approximately 10 years ago, hypertension, non-insulin- dependent diabetes mellitus, erectile dysfunction, hypercholesteremia, remote history of tobacco abuse.  He came to the ER complaining of retrosternal chest pain described as pressure, grade 5/10 after taking Viagra and after sexual activity without any associated nausea, vomiting, diaphoresis.  The patient denies any shortness of breath. Denies palpitation.  Denies any lightheadedness or syncope.  The patient denies any prior episodes of  chest pain in the past.  The patient was noted to have elevated troponin-I and EKG done in the ER showed normal sinus rhythm with nonspecific T-wave changes.  No significant acute ischemic changes were noted.  The patient ruled in for non-Q-wave myocardial infarction due to typical anginal chest pain, elevated cardiac enzymes.  Discussed with the patient and his wife at length regarding left cath, possible PTCA stenting, its risks and benefits, i.e., death, MI, stroke, need for emergency CABG, local vascular complications, etc., and consented for PCI.  PROCEDURE:  After obtaining the informed consent, the patient was brought to the Cath Lab and was placed on fluoroscopy table.  Right groin was prepped and draped in usual fashion.  Xylocaine 1% was used for local anesthesia in the right groin.  With the help of thin wall needle, 5-French arterial sheath was placed, the sheath was aspirated and flushed.  Next, 5-French left Judkins catheter was advanced over the wire under fluoroscopic guidance up to the ascending aorta.  Wire was pulled out, the catheter was aspirated and connected to the Manifold. Catheter was further advanced and engaged into left coronary ostium. Multiple views of the left system were taken.  Next, catheter was disengaged and was pulled out over the wire and was  replaced with 5- French right Judkins catheter, which was advanced over the wire under fluoroscopic guidance up to the ascending aorta.  Wire was pulled out, the catheter was aspirated and connected to the Manifold.  Catheter was further advanced and engaged into right coronary ostium.  Multiple views of the right system were taken.  Next, catheter was disengaged and was pulled out over the wire and was replaced with 5-French pigtail catheter, which was advanced over the wire under fluoroscopic guidance up to the ascending aorta.  Wire was pulled out, the catheter was aspirated and connected to the Manifold.   Catheter was further advanced across the aortic valve into the LV.  LV pressures were recorded.  Next, left ventriculography was done in 30-degree RAO position.  Post- angiographic pressures were recorded from LV and then pullback pressures were recorded from aorta.  There was no gradient across the aortic valve.  Next, the pigtail catheter was pulled out over the wire, sheaths were aspirated and flushed.  FINDINGS:  LV showed basal and mid-hypokinesia, EF of approximately 45- 50%.  Left main had 20-25% residual stenosis.  LAD has proximal and mid- junction 85-90% stenosis and 40-50% mid-stenosis.  Diagonal 1 has 75-80% stenosis, which is small vessel.  Left circumflex has 80-85% proximal stenosis.  OM 1 is small, which is patent.  OM 2 is very very small.  OM 3 is patent in proximal stented segment.  RCA has 20-25% proximal and mid-stenosis and 60-65% residual stenosis.  PDA and PLV branches were small, which were patent.  INTERVENTIONAL PROCEDURE:  Successful PTCA to proximal and mid-junction of LAD was done using 2.5 x 12-mm long Trek balloon for predilatation and then 3.0 x 33-mm long Xience drug-eluting stent was deployed in proximal and mid-junction of LAD at 11 atmospheric pressure.  The stent was postdilated using 3.25 x 15-mm long [place] Trek balloon going up to 18 atmospheric pressure.  Lesion dilated from 85-90% to 0% residual with excellent TIMI grade 3 distal flow without evidence of dissection or distal embolization.  PTCA to left circumflex was done using same 2.5 x 12-mm long Trek balloon for predilatation and then 3.5 x 23-mm long Xience Xpedition drug-eluting stent was deployed at 8 atmospheric pressure.  The stent was postdilated using 3.5 x 15-mm long [place] Trek balloon going up to 15 atmospheric pressure.  Lesion dilated from 80-85% to 0% residual with excellent TIMI grade 3 distal flow without evidence of dissection or distal embolization.  The patient received weight  based Angiomax and Integrilin during the procedure.  The patient received 180 mg of Brilinta prior to the procedure.  The patient tolerated the procedure well.  There were no complications.  The patient was transferred to recovery room in stable condition.     Eduardo Osier. Sharyn Lull, M.D.     [facility]/MEDQ  D:  03/16/2012  T:  03/17/2012  Job:  [id number]

## 2012-03-17 NOTE — Progress Notes (Signed)
Subjective:  Patient denies any chest pain or shortness of breath. States feels occasional skipping of the heartbeat. And nonsustained 6 beats of V. tach yesterday asymptomatic noted to have K. of 3. No further episodes of V. tach States overall feels better Objective:  Vital Signs in the last 24 hours: Temp:  [98.1 F (36.7 C)-98.5 F (36.9 C)] 98.1 F (36.7 C) (09/07 0800) Pulse Rate:  [64-83] 78  (09/07 0800) Resp:  [14-22] 18  (09/07 0800) BP: (128-161)/(61-92) 129/61 mmHg (09/07 0800) SpO2:  [95 %-100 %] 95 % (09/07 0800) Weight:  [79.9 kg (176 lb 2.4 oz)] 79.9 kg (176 lb 2.4 oz) (09/06 1215)  Intake/Output from previous day: 09/06 0701 - 09/07 0700 In: 2302.5 [I.V.:2302.5] Out: 1875 [Urine:1875] Intake/Output from this shift: Total I/O In: 188.4 [I.V.:188.4] Out: -   Physical Exam: Neck: no adenopathy, no carotid bruit, no JVD and supple, symmetrical, trachea midline Lungs: clear to auscultation bilaterally Heart: regular rate and rhythm, S1, S2 normal and Soft systolic murmur noted Abdomen: soft, non-tender; bowel sounds normal; no masses,  no organomegaly Extremities: extremities normal, atraumatic, no cyanosis or edema and Right groin stable with no evidence of hematoma dressing dry  Lab Results:  Basename 03/17/12 0526 03/16/12 1305  WBC 6.2 7.2  HGB 14.9 15.7  PLT 200 211    Basename 03/17/12 0526 03/16/12 1925  NA 138 138  K 3.6 3.0*  CL 103 100  CO2 27 26  GLUCOSE 176* 181*  BUN 7 8  CREATININE 0.67 0.66    Basename 03/16/12 1305  TROPONINI 4.01*   Hepatic Function Panel  Basename 03/16/12 1925  PROT 6.7  ALBUMIN 3.7  AST 33  ALT 18  ALKPHOS 72  BILITOT 1.1  BILIDIR --  IBILI --    Basename 03/17/12 0526  CHOL 212*   No results found for this basename: PROTIME in the last 72 hours  Imaging: Imaging results have been reviewed and Dg Chest Portable 1 View  03/16/2012  *RADIOLOGY REPORT*  Clinical Data: Chest pain  PORTABLE CHEST - 1  VIEW  Comparison: None.  Findings: 0740 hours. The lungs are clear without focal infiltrate, edema, pneumothorax or pleural effusion. Cardiopericardial silhouette is at upper limits of normal for size.  Degenerative changes are noted in the right shoulder. Telemetry leads overlie the chest.  IMPRESSION: No acute cardiopulmonary findings.   Original Report Authenticated By: ERIC A. MANSELL, M.D.     Cardiac Studies:  Assessment/Plan:  Acute non-Q-wave myocardial infarction status post PTCA stenting to LAD and left circumflex Status post nonsustained VT asymptomatic Status post hypokalemia Coronary artery disease history of silent intraoral myocardial infarction in the past status post PCI to RCA and obtuse marginal 3 in 2003  Hypertension  Non-insulin-dependent diabetes mellitus  Remote tobacco abuse  Erectile dysfunction  Hypercholesteremia Plan Increase beta blockers as per orders Add low-dose ACE inhibitors Phase I cardiac rehabilitation Check labs in a.m. Hopefully discharge in a.m. if stable  LOS: 1 day    Ashayla Subia N 03/17/2012, 11:03 AM

## 2012-03-18 LAB — BASIC METABOLIC PANEL
CO2: 25 mEq/L (ref 19–32)
Calcium: 8.7 mg/dL (ref 8.4–10.5)
Creatinine, Ser: 0.73 mg/dL (ref 0.50–1.35)
GFR calc non Af Amer: 88 mL/min — ABNORMAL LOW (ref >90)
Sodium: 135 mEq/L (ref 135–145)

## 2012-03-18 LAB — CBC
Hemoglobin: 15.2 g/dL (ref 13.0–17.0)
Platelets: 174 10*3/uL (ref 150–400)
RBC: 4.9 MIL/uL (ref 4.22–5.81)
WBC: 6.5 10*3/uL (ref 4.0–10.5)

## 2012-03-18 LAB — TROPONIN I: Troponin I: 2.46 ng/mL (ref <0.30)

## 2012-03-18 LAB — CK TOTAL AND CKMB (NOT AT ARMC)
CK, MB: 11.5 ng/mL (ref 0.3–4.0)
Relative Index: 2.9 — ABNORMAL HIGH (ref 0.0–2.5)
Total CK: 397 U/L — ABNORMAL HIGH (ref 7–232)

## 2012-03-18 MED ORDER — METOPROLOL TARTRATE 25 MG PO TABS: 25.0000 mg | ORAL_TABLET | Freq: Two times a day (BID) | ORAL | Status: DC

## 2012-03-18 MED ORDER — RAMIPRIL 2.5 MG PO CAPS: 2.5000 mg | ORAL_CAPSULE | Freq: Every day | ORAL | Status: DC

## 2012-03-18 MED ORDER — POTASSIUM CHLORIDE CRYS ER 20 MEQ PO TBCR
40.0000 meq | EXTENDED_RELEASE_TABLET | Freq: Once | ORAL | Status: AC
  Administered 2012-03-18: 40 meq via ORAL
  Filled 2012-03-18: qty 2

## 2012-03-18 MED ORDER — NITROGLYCERIN 0.4 MG SL SUBL: 0.4000 mg | SUBLINGUAL_TABLET | SUBLINGUAL | Status: AC | PRN

## 2012-03-18 MED ORDER — TICAGRELOR 90 MG PO TABS: 90.0000 mg | ORAL_TABLET | Freq: Two times a day (BID) | ORAL | Status: DC

## 2012-03-18 MED ORDER — PANTOPRAZOLE SODIUM 40 MG PO TBEC: 40.0000 mg | DELAYED_RELEASE_TABLET | Freq: Every day | ORAL | Status: DC

## 2012-03-18 MED ORDER — ATORVASTATIN CALCIUM 80 MG PO TABS: 80.0000 mg | ORAL_TABLET | Freq: Every day | ORAL | Status: DC

## 2012-03-18 NOTE — Discharge Summary (Signed)
  Discharge summary dictated on 03/18/2012 dictation number is [phone]

## 2012-03-19 LAB — GLUCOSE, CAPILLARY
Glucose-Capillary: 161 mg/dL — ABNORMAL HIGH (ref 70–99)
Glucose-Capillary: 186 mg/dL — ABNORMAL HIGH (ref 70–99)
Glucose-Capillary: 159 mg/dL — ABNORMAL HIGH (ref 70–99)

## 2012-03-19 NOTE — Discharge Summary (Signed)
Alexander Donaldson, BUNDA NO.:  [id number]  MEDICAL RECORD NO.:  [id number]  LOCATION:  2925                         FACILITY:  [facility]  PHYSICIAN:  Eduardo Osier. Sharyn Lull, M.D. DATE OF BIRTH:  02-23-1934  DATE OF ADMISSION:  03/16/2012 DATE OF DISCHARGE:  03/18/2012                              DISCHARGE SUMMARY   ADMITTING DIAGNOSES: 1. Acute non-Q-wave myocardial infarction. 2. Coronary artery disease, history of silent inferior wall myocardial     infarction in the past status post percutaneous transluminal     coronary angioplasty stenting to right coronary artery and obtuse     marginal 3 in 2003. 3. Hypertension. 4. Non-insulin-dependent diabetes mellitus. 5. Remote tobacco abuse. 6. Hypercholesteremia. 7. Erectile dysfunction.  DISCHARGE DIAGNOSES: 1. Status post acute non-Q-wave myocardial infarction status post     percutaneous transluminal coronary angioplasty to left anterior     descending and left circumflex as per procedure report. 2. Multivessel coronary artery disease. 3. History of inferior wall myocardial infarction in the past status     post percutaneous transluminal coronary angioplasty stenting to     right coronary artery and obtuse marginal 3. 4. Hypertension. 5. Non-insulin-dependent diabetes mellitus. 6. Hypercholesteremia. 7. Remote tobacco abuse. 8. Erectile dysfunction.  DISCHARGE HOME MEDICATIONS: 1. Enteric-coated aspirin 81 mg 1 tablet daily. 2. Atorvastatin 80 mg 1 tablet daily. 3. Metoprolol 25 mg 1 tablet twice daily. 4. Nitrostat 0.4 mg sublingual use as directed. 5. Protonix 40 mg 1 tablet daily. 6. Ramipril 2.5 mg 1 capsule daily. 7. Brilinta 90 mg 1 tablet twice daily. 8. Glipizide 10 mg twice daily as before. 9. Multivitamin with minerals 1 tablet daily. 10.Clear eye drops as before.  The patient has been advised to stop Viagra and amlodipine.  DIET:  Low salt, low cholesterol, 1800 calories ADA diet.  Post-PTCA  stent instructions have been given.  The patient will be scheduled for phase 2 cardiac rehab as an outpatient.  Follow up with me in 1 week.  CONDITION AT DISCHARGE:  Stable.  BRIEF HISTORY AND HOSPITAL COURSE:  Alexander Donaldson is a 78 year old male with past medical history significant for coronary artery disease, history of silent inferior wall myocardial infarction in the past status post PTCA stenting to 100% occluded RCA and PTCA stenting to obtuse marginal 3 in 2003 approximately 10 years ago, hypertension, non-insulin- dependent diabetes mellitus, erectile dysfunction, remote tobacco abuse, hypercholesteremia who came to the ER complaining of retrosternal chest pain described as pressure, grade 5/10 after taking Viagra and sexual activity without any associated nausea, vomiting, diaphoresis.  The patient denies any shortness of breath.  Denies any palpitation, lightheadedness, or syncope.  Denies prior episodes of chest pain in the past.  The patient was noted to have elevated troponin-I in the ED.  EKG done in the ER showed normal sinus rhythm with nonspecific ST-T wave changes.  PAST MEDICAL HISTORY:  As above.  PHYSICAL EXAMINATION:  VITAL SIGNS:  His blood pressure was 159/76, pulse was 76.  He was afebrile. HEENT:  Conjunctivae was pink. NECK:  Supple.  No JVD.  No bruit. LUNGS:  Clear to auscultation without rhonchi or rales. CARDIOVASCULAR:  S1 and S2  was normal.  There was soft systolic murmur and S4 gallop. ABDOMEN:  Soft.  Bowel sounds were present.  Nontender. EXTREMITIES:  No clubbing, cyanosis, or edema. NEUROLOGIC:  Grossly intact.  LABORATORY DATA:  Sodium was 136, potassium 3.7, BUN 12, creatinine 0.72.  His glucose was 260, hemoglobin was 15.2, hematocrit 41.3, white count of 4.5.  His cardiac enzymes, 1st set troponin-I was 0.03.  Second set of troponin-I in the ED was 2.25, repeat troponin-I was 4.01. Troponin-I is today is 2.46, which is trending down.  CK  is 397, MB 11.5.  Today, his potassium is 3.2, which has been replaced.  BUN is 10, creatinine is 0.73, glucose is 201.  His cholesterol was 212, triglycerides 196, HDL 39, LDL was 134.  His hemoglobin A1c was not drawn or still pending.  EKG showed normal sinus rhythm with old inferior wall MI with nonspecific ST-T wave changes.  Repeat EKG today shows normal sinus rhythm with old inferior wall MI, poor R-wave progression, probably due to lead placement, ST-T wave changes in lateral leads.  BRIEF HOSPITAL COURSE:  The patient was admitted to step-down unit.  The patient ruled in for non-Q-wave myocardial infarction.  The patient subsequently underwent left cardiac cath with selective left and right coronary angiography and PTCA stenting to LAD and left circumflex as per procedure report.  The patient tolerated procedure well.  There were no complications.  Postprocedure, the patient did not have any anginal chest pain. Phase 1 cardiac rehab was called.  The patient has been ambulating in the hallway without any problems.  His cardiac enzymes are trending down.  The patient will be discharged home on above medications and will be followed up in my office in 1 week.  The patient will be scheduled for phase 2 cardiac rehab as an outpatient.     Eduardo Osier. Sharyn Lull, M.D.     [facility]  D:  03/18/2012  T:  03/19/2012  Job:  [id number]

## 2012-03-20 MED FILL — Dextrose Inj 5%: INTRAVENOUS | Qty: 1000 | Status: AC

## 2012-04-19 ENCOUNTER — Encounter (HOSPITAL_COMMUNITY)
Admission: RE | Admit: 2012-04-19 | Discharge: 2012-04-19 | Disposition: A | Discharge status: Home / Self Care | Payer: 59 | Source: Ambulatory Visit | Attending: Cardiology | Admitting: Cardiology

## 2012-04-19 DIAGNOSIS — I214 Non-ST elevation (NSTEMI) myocardial infarction: Secondary | ICD-10-CM | POA: Insufficient documentation

## 2012-04-19 DIAGNOSIS — Z9861 Coronary angioplasty status: Secondary | ICD-10-CM | POA: Insufficient documentation

## 2012-04-19 DIAGNOSIS — I252 Old myocardial infarction: Secondary | ICD-10-CM | POA: Insufficient documentation

## 2012-04-19 DIAGNOSIS — Z7902 Long term (current) use of antithrombotics/antiplatelets: Secondary | ICD-10-CM | POA: Insufficient documentation

## 2012-04-19 DIAGNOSIS — Z87891 Personal history of nicotine dependence: Secondary | ICD-10-CM | POA: Insufficient documentation

## 2012-04-19 DIAGNOSIS — I10 Essential (primary) hypertension: Secondary | ICD-10-CM | POA: Insufficient documentation

## 2012-04-19 DIAGNOSIS — E7800 Pure hypercholesterolemia, unspecified: Secondary | ICD-10-CM | POA: Insufficient documentation

## 2012-04-19 DIAGNOSIS — Z7982 Long term (current) use of aspirin: Secondary | ICD-10-CM | POA: Insufficient documentation

## 2012-04-19 DIAGNOSIS — I2510 Atherosclerotic heart disease of native coronary artery without angina pectoris: Secondary | ICD-10-CM | POA: Insufficient documentation

## 2012-04-19 DIAGNOSIS — Z5189 Encounter for other specified aftercare: Secondary | ICD-10-CM | POA: Insufficient documentation

## 2012-04-19 DIAGNOSIS — Z79899 Other long term (current) drug therapy: Secondary | ICD-10-CM | POA: Insufficient documentation

## 2012-04-19 DIAGNOSIS — E119 Type 2 diabetes mellitus without complications: Secondary | ICD-10-CM | POA: Insufficient documentation

## 2012-04-19 NOTE — Progress Notes (Signed)
Cardiac Rehab Medication Review by a Pharmacist  Does the patient  feel that his/her medications are working for him/her?  yes  Has the patient been experiencing any side effects to the medications prescribed?  no  Does the patient measure his/her own blood pressure or blood glucose at home?  yes , 2 times daily, has 2 cuffs  Does the patient have any problems obtaining medications due to transportation or finances?   no  Understanding of regimen: good Understanding of indications: good Potential of compliance: excellent  Pharmacist comments: Patients medications list was reviewed for accuracy, indications, adverse effects, and compliance. New medications updated. Patient, along with wife's assistance is very compliant with medications. Any questions from the patient or his wife were addressed at this time.   Abran Duke, PharmD Clinical Pharmacist Phone: [phone] Pager: [phone] 04/19/2012 8:46 AM

## 2012-04-23 ENCOUNTER — Encounter (HOSPITAL_COMMUNITY)
Admission: RE | Admit: 2012-04-23 | Discharge: 2012-04-23 | Disposition: A | Discharge status: Home / Self Care | Payer: 59 | Source: Ambulatory Visit | Attending: Cardiology | Admitting: Cardiology

## 2012-04-23 NOTE — Progress Notes (Signed)
Pt in today for first day of exercise at 6:45 exercise class time.  Pt tolerated exercise with no physical complaints however pt and wife are concerned about his elevation in his BP since his cardiac event.  Pt reports that he ws on a different BP medication provided by the [place].  Pt's wife plans to contact Dr. Katrinka Blazing office today for an appointment to discuss his medications.  Dr. Sharyn Lull advised pt to call if the new medications did not work for him.   Monitor showed Sr with Slight ST depression no noted ectopy.  Rehab report sent to Dr.harwani's office for review and in anticipation of appointment.

## 2012-04-25 ENCOUNTER — Encounter (HOSPITAL_COMMUNITY)
Admission: RE | Admit: 2012-04-25 | Discharge: 2012-04-25 | Disposition: A | Discharge status: Home / Self Care | Payer: 59 | Source: Ambulatory Visit | Attending: Cardiology | Admitting: Cardiology

## 2012-04-27 ENCOUNTER — Encounter (HOSPITAL_COMMUNITY): Payer: 59

## 2012-04-30 ENCOUNTER — Encounter (HOSPITAL_COMMUNITY): Payer: 59

## 2012-04-30 ENCOUNTER — Telehealth (HOSPITAL_COMMUNITY): Payer: Self-pay | Admitting: *Deleted

## 2012-04-30 NOTE — Telephone Encounter (Signed)
Pt returned call from message left  Pt absent x 2 exercise sessions. Pt with continued elevation in bp.  Pt wanting to see if the medication change will work,

## 2012-05-02 ENCOUNTER — Encounter (HOSPITAL_COMMUNITY): Payer: 59

## 2012-05-04 ENCOUNTER — Encounter (HOSPITAL_COMMUNITY): Payer: 59

## 2012-05-07 ENCOUNTER — Encounter (HOSPITAL_COMMUNITY): Payer: 59

## 2012-05-09 ENCOUNTER — Encounter (HOSPITAL_COMMUNITY): Payer: 59

## 2012-05-11 ENCOUNTER — Encounter (HOSPITAL_COMMUNITY): Payer: Medicare Other

## 2012-05-14 ENCOUNTER — Encounter (HOSPITAL_COMMUNITY): Payer: Medicare Other

## 2012-05-16 ENCOUNTER — Encounter (HOSPITAL_COMMUNITY): Payer: Medicare Other

## 2012-05-18 ENCOUNTER — Telehealth (HOSPITAL_COMMUNITY): Payer: Self-pay | Admitting: *Deleted

## 2012-05-18 ENCOUNTER — Encounter (HOSPITAL_COMMUNITY): Payer: Medicare Other

## 2012-05-18 NOTE — Telephone Encounter (Signed)
Message left- Pt absent for several sessions.  Inquired regarding return date to exercise and follow up appt with Dr. Sharyn Lull regarding elevated bp.

## 2012-05-21 ENCOUNTER — Encounter (HOSPITAL_COMMUNITY): Payer: Medicare Other

## 2012-05-23 ENCOUNTER — Encounter (HOSPITAL_COMMUNITY): Payer: Medicare Other

## 2012-05-25 ENCOUNTER — Encounter (HOSPITAL_COMMUNITY): Payer: Medicare Other

## 2012-05-28 ENCOUNTER — Encounter (HOSPITAL_COMMUNITY): Admission: RE | Admit: 2012-05-28 | Payer: Medicare Other | Source: Ambulatory Visit

## 2012-05-30 ENCOUNTER — Encounter (HOSPITAL_COMMUNITY): Payer: Medicare Other

## 2012-06-01 ENCOUNTER — Encounter (HOSPITAL_COMMUNITY): Payer: Medicare Other

## 2012-06-04 ENCOUNTER — Encounter (HOSPITAL_COMMUNITY): Payer: Medicare Other

## 2012-06-06 ENCOUNTER — Encounter (HOSPITAL_COMMUNITY): Payer: Medicare Other

## 2012-06-08 ENCOUNTER — Encounter (HOSPITAL_COMMUNITY): Payer: Medicare Other

## 2012-06-11 ENCOUNTER — Encounter (HOSPITAL_COMMUNITY): Payer: Medicare Other

## 2012-06-13 ENCOUNTER — Encounter (HOSPITAL_COMMUNITY): Payer: Medicare Other

## 2012-06-15 ENCOUNTER — Encounter (HOSPITAL_COMMUNITY): Payer: Medicare Other

## 2012-06-18 ENCOUNTER — Encounter (HOSPITAL_COMMUNITY): Payer: Medicare Other

## 2012-06-20 ENCOUNTER — Encounter (HOSPITAL_COMMUNITY): Payer: Medicare Other

## 2012-06-22 ENCOUNTER — Encounter (HOSPITAL_COMMUNITY): Payer: Medicare Other

## 2012-06-25 ENCOUNTER — Encounter (HOSPITAL_COMMUNITY): Payer: Medicare Other

## 2012-06-27 ENCOUNTER — Encounter (HOSPITAL_COMMUNITY): Payer: Medicare Other

## 2012-06-29 ENCOUNTER — Encounter (HOSPITAL_COMMUNITY): Payer: Medicare Other

## 2012-07-02 ENCOUNTER — Encounter (HOSPITAL_COMMUNITY): Payer: Medicare Other

## 2012-07-06 ENCOUNTER — Encounter (HOSPITAL_COMMUNITY): Payer: Medicare Other

## 2012-07-09 ENCOUNTER — Encounter (HOSPITAL_COMMUNITY): Payer: Medicare Other

## 2012-07-11 ENCOUNTER — Encounter (HOSPITAL_COMMUNITY): Payer: Medicare Other

## 2012-07-13 ENCOUNTER — Encounter (HOSPITAL_COMMUNITY): Payer: Medicare Other

## 2012-07-16 ENCOUNTER — Encounter (HOSPITAL_COMMUNITY): Payer: Medicare Other

## 2012-07-18 ENCOUNTER — Encounter (HOSPITAL_COMMUNITY): Payer: Medicare Other

## 2012-07-20 ENCOUNTER — Encounter (HOSPITAL_COMMUNITY): Payer: Medicare Other

## 2012-07-23 ENCOUNTER — Encounter (HOSPITAL_COMMUNITY): Payer: Medicare Other

## 2012-07-25 ENCOUNTER — Encounter (HOSPITAL_COMMUNITY): Payer: Medicare Other

## 2012-07-27 ENCOUNTER — Encounter (HOSPITAL_COMMUNITY): Payer: Medicare Other

## 2012-10-11 ENCOUNTER — Emergency Department (INDEPENDENT_AMBULATORY_CARE_PROVIDER_SITE_OTHER): Payer: 59

## 2012-10-11 ENCOUNTER — Encounter (HOSPITAL_COMMUNITY): Payer: Self-pay | Admitting: Emergency Medicine

## 2012-10-11 ENCOUNTER — Emergency Department (HOSPITAL_COMMUNITY)
Admission: EM | Admit: 2012-10-11 | Discharge: 2012-10-11 | Disposition: A | Discharge status: Home / Self Care | Payer: Medicare Other | Source: Home / Self Care | Attending: Emergency Medicine | Admitting: Emergency Medicine

## 2012-10-11 DIAGNOSIS — R05 Cough: Secondary | ICD-10-CM

## 2012-10-11 MED ORDER — GUAIFENESIN-CODEINE 100-10 MG/5ML PO SYRP: 5.0000 mL | ORAL_SOLUTION | Freq: Three times a day (TID) | ORAL | Status: DC | PRN

## 2012-10-11 NOTE — ED Notes (Signed)
Reports: dry nonproductive cough/chest congestion, symptoms worse at night. Symptoms present x 4 days.  Sore throat started this a.m  Pt denies sob and chest pain n/v/d  Pt has taken robitussin DM with no relief.

## 2012-10-11 NOTE — ED Provider Notes (Signed)
History     CSN: [id number]  Arrival date & time 10/11/12  1448   First MD Initiated Contact with Patient 10/11/12 1531      Chief Complaint  Patient presents with  . URI    dry nonproductive cough. chest congestion. symptoms worse at night. sore throat that started today    (Consider location/radiation/quality/duration/timing/severity/associated sxs/prior treatment) HPI Comments: Patient presents urgent care this evening describing, but last night he couldn't sleep well as he was having this constant cough that kept waking him up. He's been sick for about 5 days felt his chest congestion and having a dry nonproductive cough denies any fevers was having a cold about 3-4 days ago. Is somewhat tired and short of his from his recent Dr. as they were out of town. Patient denies any other symptoms such as chest pains shortness of breath. Does have a  Patient is a 79 y.o. male presenting with URI. The history is provided by the patient.  URI Presenting symptoms: congestion, cough, rhinorrhea and sore throat   Presenting symptoms: no ear pain, no fatigue and no fever   Severity:  Moderate Onset quality:  Gradual Duration:  4 days Timing:  Constant Relieved by:  Nothing Worsened by:  Nothing tried Associated symptoms: no arthralgias, no headaches, no myalgias, no neck pain, no sinus pain, no sneezing, no swollen glands and no wheezing   Risk factors: not elderly, no chronic cardiac disease, no chronic kidney disease, no chronic respiratory disease, no diabetes mellitus, no immunosuppression, no recent illness and no recent travel     Past Medical History  Diagnosis Date  . Diabetes mellitus   . Hypertension   . Coronary artery disease     Past Surgical History  Procedure Laterality Date  . Angioplasty    . Tonsillectomy      History reviewed. No pertinent family history.  History  Substance Use Topics  . Smoking status: Never Smoker   . Smokeless tobacco: Never Used  .  Alcohol Use: No     Comment: social      Review of Systems  Constitutional: Negative for fever, chills, diaphoresis, activity change, appetite change, fatigue and unexpected weight change.  HENT: Positive for congestion, sore throat and rhinorrhea. Negative for ear pain, nosebleeds, sneezing, neck pain, neck stiffness, postnasal drip, tinnitus and ear discharge.   Eyes: Negative for discharge.  Respiratory: Positive for cough. Negative for apnea, choking, chest tightness, shortness of breath, wheezing and stridor.   Gastrointestinal: Negative for abdominal pain.  Musculoskeletal: Negative for myalgias, back pain and arthralgias.  Skin: Negative for color change, rash and wound.  Neurological: Negative for dizziness and headaches.    Allergies  Review of patient's allergies indicates no known allergies.  Home Medications   Current Outpatient Rx  Name  Route  Sig  Dispense  Refill  . aspirin 81 MG tablet   Oral   Take 81 mg by mouth daily.           Marland Kitchen atorvastatin (LIPITOR) 80 MG tablet   Oral   Take 1 tablet (80 mg total) by mouth daily at 6 PM.   30 tablet   3   . glipiZIDE (GLUCOTROL XL) 10 MG 24 hr tablet   Oral   Take 10 mg by mouth 2 (two) times daily.         . metFORMIN (GLUCOPHAGE) 500 MG tablet   Oral   Take 500 mg by mouth daily after breakfast.         .  metoprolol tartrate (LOPRESSOR) 25 MG tablet   Oral   Take 1 tablet (25 mg total) by mouth 2 (two) times daily.   60 tablet   3   . pantoprazole (PROTONIX) 40 MG tablet   Oral   Take 1 tablet (40 mg total) by mouth daily at 6 (six) AM.   30 tablet   3   . ramipril (ALTACE) 2.5 MG capsule   Oral   Take 1 capsule (2.5 mg total) by mouth daily.   30 capsule   3   . Ticagrelor (BRILINTA) 90 MG TABS tablet   Oral   Take 1 tablet (90 mg total) by mouth 2 (two) times daily.   60 tablet   11   . guaiFENesin-codeine (ROBITUSSIN AC) 100-10 MG/5ML syrup   Oral   Take 5 mLs by mouth 3 (three)  times daily as needed for cough.   120 mL   0   . methylcellulose (ARTIFICIAL TEARS) 1 % ophthalmic solution   Both Eyes   Place 1 drop into both eyes as needed.         . Multiple Vitamin (MULTIVITAMIN WITH MINERALS) TABS   Oral   Take 1 tablet by mouth daily.         . nitroGLYCERIN (NITROSTAT) 0.4 MG SL tablet   Sublingual   Place 1 tablet (0.4 mg total) under the tongue every 5 (five) minutes x 3 doses as needed for chest pain.   25 tablet   3     BP 166/65  Pulse 67  Temp(Src) 98 F (36.7 C) (Oral)  Resp 16  SpO2 98%  Physical Exam  Nursing note and vitals reviewed. Constitutional: Vital signs are normal. He appears well-developed and well-nourished.  Non-toxic appearance. He does not have a sickly appearance. He does not appear ill. No distress.  HENT:  Head: Normocephalic.  Mouth/Throat: No oropharyngeal exudate.  Eyes: Conjunctivae are normal. Pupils are equal, round, and reactive to light.  Neck: Neck supple. No JVD present.  Pulmonary/Chest: Effort normal. He has decreased breath sounds. He has no wheezes. He has no rhonchi. He has no rales. He exhibits no tenderness.  Neurological: He is alert.  Skin: No rash noted. No erythema.    ED Course  Procedures (including critical care time)  Labs Reviewed - No data to display Dg Chest 2 View  10/11/2012  *RADIOLOGY REPORT*  Clinical Data: Upper respiratory tract infection the  CHEST - 2 VIEW  Comparison: 03/16/2012  Findings: Normal heart size.  Clear lungs.  No pneumothorax or pleural effusion.  Intact thoracic spine.  IMPRESSION: No active cardiopulmonary disease.   Original Report Authenticated By: Jolaine Click, M.D.      1. Cough       MDM  Unremarkable x-ray. Patient looks comfortable afebrile normal pulse ox. Will treat with an antitussive most likely reactive cough or allergenic or related to environmental changes. Patient otherwise to followup with primary care Dr. if problems persist beyond 10  days.        Jimmie Molly, MD 10/11/12 [phone]

## 2013-01-24 ENCOUNTER — Emergency Department (HOSPITAL_COMMUNITY)
Admission: EM | Admit: 2013-01-24 | Discharge: 2013-01-24 | Disposition: A | Discharge status: Home / Self Care | Payer: 59 | Attending: Emergency Medicine | Admitting: Emergency Medicine

## 2013-01-24 ENCOUNTER — Encounter (HOSPITAL_COMMUNITY): Payer: Self-pay | Admitting: Emergency Medicine

## 2013-01-24 ENCOUNTER — Emergency Department (HOSPITAL_COMMUNITY): Payer: 59

## 2013-01-24 DIAGNOSIS — E119 Type 2 diabetes mellitus without complications: Secondary | ICD-10-CM | POA: Insufficient documentation

## 2013-01-24 DIAGNOSIS — Z79899 Other long term (current) drug therapy: Secondary | ICD-10-CM | POA: Insufficient documentation

## 2013-01-24 DIAGNOSIS — Z9861 Coronary angioplasty status: Secondary | ICD-10-CM | POA: Insufficient documentation

## 2013-01-24 DIAGNOSIS — H532 Diplopia: Secondary | ICD-10-CM | POA: Insufficient documentation

## 2013-01-24 DIAGNOSIS — I10 Essential (primary) hypertension: Secondary | ICD-10-CM | POA: Insufficient documentation

## 2013-01-24 DIAGNOSIS — H539 Unspecified visual disturbance: Secondary | ICD-10-CM | POA: Insufficient documentation

## 2013-01-24 DIAGNOSIS — I2510 Atherosclerotic heart disease of native coronary artery without angina pectoris: Secondary | ICD-10-CM | POA: Insufficient documentation

## 2013-01-24 DIAGNOSIS — Z7982 Long term (current) use of aspirin: Secondary | ICD-10-CM | POA: Insufficient documentation

## 2013-01-24 DIAGNOSIS — E785 Hyperlipidemia, unspecified: Secondary | ICD-10-CM | POA: Insufficient documentation

## 2013-01-24 MED ORDER — CLOPIDOGREL BISULFATE 75 MG PO TABS
75.0000 mg | ORAL_TABLET | Freq: Every day | ORAL | Status: DC
  Filled 2013-01-24: qty 1

## 2013-01-24 MED ORDER — AMLODIPINE BESYLATE 5 MG PO TABS: 5.0000 mg | ORAL_TABLET | Freq: Every day | ORAL | Status: DC

## 2013-01-24 MED ORDER — ASPIRIN 81 MG PO TABS: 81.0000 mg | ORAL_TABLET | Freq: Every day | ORAL | Status: DC

## 2013-01-24 MED ORDER — METOPROLOL TARTRATE 25 MG PO TABS
25.0000 mg | ORAL_TABLET | Freq: Two times a day (BID) | ORAL | Status: DC
  Administered 2013-01-24: 25 mg via ORAL
  Filled 2013-01-24: qty 1

## 2013-01-24 MED ORDER — METFORMIN HCL 500 MG PO TABS
500.0000 mg | ORAL_TABLET | Freq: Two times a day (BID) | ORAL | Status: DC
  Administered 2013-01-24: 500 mg via ORAL
  Filled 2013-01-24: qty 1

## 2013-01-24 MED ORDER — ASPIRIN 81 MG PO CHEW
81.0000 mg | CHEWABLE_TABLET | Freq: Every day | ORAL | Status: DC
  Administered 2013-01-24: 81 mg via ORAL
  Filled 2013-01-24: qty 1

## 2013-01-24 NOTE — Consult Note (Addendum)
NEURO HOSPITALIST CONSULT NOTE    Reason for Consult: acute onset diplopia  HPI:                                                                                                                                          Alexander Donaldson is an 79 y.o. male, right handed, with a past medical history significant for hypertension, DM, hyperlipidemia, CAD s/p stenting x 2, brought to [place] ED with acute onset diplopia. Never had this problem before. Stated that he woke up around 3 am today and while getting [place] go to the airport developed sudden onset of double vision mainly when looking farther away to the right, up and down. Said that the double vision will go away by closing or covering one eye. No associated headache, nausea, vomiting, vertigo, focal weakness or numbness, eye droopiness or pain, difficulty swallowing or speaking, slurred speech, or imbalance. Denies recent head or eye trauma. No shortness of breath, fatigue, or muscle weakness.     Past Medical History  Diagnosis Date  . Diabetes mellitus   . Hypertension   . Coronary artery disease     Past Surgical History  Procedure Laterality Date  . Angioplasty    . Tonsillectomy    . Coronary stents      No family history on file.  Family History:   Social History:  reports that he has never smoked. He has never used smokeless tobacco. He reports that he does not drink alcohol or use illicit drugs.  No Known Allergies  MEDICATIONS:                                                                                                                     I have reviewed the patient's current medications.   ROS:  History obtained from the patient and family.  General ROS: negative for - chills, fatigue, fever, night sweats, weight gain or weight loss Psychological ROS:  negative for - behavioral disorder, hallucinations, memory difficulties, mood swings or suicidal ideation Ophthalmic ROS: negative for - blurry vision, eye pain or loss of vision ENT ROS: negative for - epistaxis, nasal discharge, oral lesions, sore throat, tinnitus or vertigo Allergy and Immunology ROS: negative for - hives or itchy/watery eyes Hematological and Lymphatic ROS: negative for - bleeding problems, bruising or swollen lymph nodes Endocrine ROS: negative for - galactorrhea, hair pattern changes, polydipsia/polyuria or temperature intolerance Respiratory ROS: negative for - cough, hemoptysis, shortness of breath or wheezing Cardiovascular ROS: negative for - chest pain, dyspnea on exertion, edema or irregular heartbeat Gastrointestinal ROS: negative for - abdominal pain, diarrhea, hematemesis, nausea/vomiting or stool incontinence Genito-Urinary ROS: negative for - dysuria, hematuria, incontinence or urinary frequency/urgency Musculoskeletal ROS: negative for - joint swelling or muscular weakness Neurological ROS: as noted in HPI Dermatological ROS: negative for rash and skin lesion changes     Physical exam: pleasant male in no apparent distress. Blood pressure 169/75, pulse 69, temperature 98 F (36.7 C), temperature source Oral, resp. rate 16, SpO2 96.00%. Head: normocephalic. Neck: supple, no bruits, no JVD. Cardiac: no murmurs. Lungs: clear. Abdomen: soft, no tender, no mass. Extremities: no edema.     Neurologic Examination:                                                                                                      Mental Status: Alert, oriented, thought content appropriate.  Speech fluent without evidence of aphasia.  Able to follow 3 step commands without difficulty. Cranial Nerves: II: Discs flat bilaterally; Visual fields grossly normal, pupils equal, round, reactive to light and accommodation III,IV, VI: ptosis not present, extra-ocular motions intact  bilaterally V,VII: smile symmetric, facial light touch sensation normal bilaterally VIII: hearing normal bilaterally IX,X: gag reflex present XI: bilateral shoulder shrug XII: midline tongue extension Motor: Right : Upper extremity   5/5    Left:     Upper extremity   5/5  Lower extremity   5/5     Lower extremity   5/5 Tone and bulk:normal tone throughout; no atrophy noted Sensory: Pinprick and light touch intact throughout, bilaterally Deep Tendon Reflexes:  1+ all over  Plantars: Right: downgoing   Left: downgoing Cerebellar: normal finger-to-nose,  normal heel-to-shin test Gait:  No ataxia. CV: pulses palpable throughout    Lab Results  Component Value Date/Time   CHOL 212* 03/17/2012  5:26 AM    No results found for this or any previous visit (from the past 48 hour(s)).  No results found.   Assessment/Plan: 79 years old with multiple risk factors for stroke and acute onset isolated diplopia. Neuro-exam shows no ptosis or frank cranial nerve involvement but horizontal and vertical diplopia. Will get MRI-DWI to exclude the possibility of acute cerebrovascular insult or other lesion involving the posterior fossa. Other etiologies like M Gravis also in the differential, thus will suggest acetylcholine receptor antibodies as  MRI brain is normal    Follow up with either [facility] [address] [place], [place] [address]--[phone] or [facility] neurology [address] [place], [place] [address] [phone]  For follow up on myasthenia panel----in two weeks or sooner if having worsening symptoms.   Wyatt Portela, MD [facility] [phone]  01/24/2013, 8:19 AM

## 2013-01-24 NOTE — ED Notes (Signed)
Glucophage due time moved to 1000. Pt states he normally doesn't eat breakfast till around 1100.

## 2013-01-24 NOTE — ED Notes (Signed)
Dr. Nanavati back at the bedside.  

## 2013-01-24 NOTE — ED Notes (Signed)
Neurology at the bedside

## 2013-01-24 NOTE — ED Notes (Signed)
PT. WOKE UP THIS WITH BRIEF EPISODE OF DOUBLE VISION , ALERT AND ORIENTED , SPEECH CLEAR , NO FACIAL ASYMMETRY , EQUAL STRONG GRIPS / NO ARM DRIFT.

## 2013-01-24 NOTE — ED Notes (Signed)
Plavix moved to 2200. Pt states he takes this at night and just took it at 2200 last night.

## 2013-01-24 NOTE — ED Notes (Signed)
CBG of 197 checked before giving Meformin

## 2013-01-24 NOTE — ED Notes (Signed)
Patient transported to MRI 

## 2013-01-29 LAB — MYASTHENIA GRAVIS PANEL 2
Acetylchol Block Ab: <15 % of inhibition (ref <15)
Acetylcholine Modulat Ab: 17 %

## 2013-01-31 NOTE — ED Provider Notes (Signed)
History    CSN: [id number] Arrival date & time 01/24/13  0403  First MD Initiated Contact with Patient 01/24/13 [phone]     Chief Complaint  Patient presents with  . Eye Problem   (Consider location/radiation/quality/duration/timing/severity/associated sxs/prior Treatment) HPI Comments:  79 y.o. male, with a past medical history significant for hypertension, DM, hyperlipidemia, CAD s/p stenting x 2, brought to [facility] ED with acute onset diplopia. Stated that he woke up around 3 am today and while [place] the airport developed sudden onset of double vision mainly when looking farther away to the right, up and down. Said that the double vision will go away by closing or covering one eye. No associated headache, nausea, vomiting, vertigo, focal weakness or numbness, eye droopiness or pain, difficulty swallowing or speaking, slurred speech, or imbalance. Denies recent head or eye trauma.   Patient is a 79 y.o. male presenting with eye problem. The history is provided by the patient.  Eye Problem Associated symptoms: no numbness, no photophobia, no redness and no weakness    Past Medical History  Diagnosis Date  . Diabetes mellitus   . Hypertension   . Coronary artery disease    Past Surgical History  Procedure Laterality Date  . Angioplasty    . Tonsillectomy    . Coronary stents     No family history on file. History  Substance Use Topics  . Smoking status: Never Smoker   . Smokeless tobacco: Never Used  . Alcohol Use: No     Comment: social    Review of Systems  Constitutional: Negative for activity change and appetite change.  Eyes: Positive for visual disturbance. Negative for photophobia, pain and redness.  Respiratory: Negative for cough and shortness of breath.   Cardiovascular: Negative for chest pain.  Gastrointestinal: Negative for abdominal pain.  Genitourinary: Negative for dysuria.  Neurological: Negative for dizziness, tremors, seizures, syncope,  facial asymmetry, speech difficulty, weakness, light-headedness and numbness.    Allergies  Review of patient's allergies indicates no known allergies.  Home Medications   Current Outpatient Rx  Name  Route  Sig  Dispense  Refill  . amLODipine (NORVASC) 5 MG tablet   Oral   Take 5 mg by mouth at bedtime.         Marland Kitchen aspirin 81 MG tablet   Oral   Take 81 mg by mouth daily.           Marland Kitchen atorvastatin (LIPITOR) 80 MG tablet   Oral   Take 1 tablet (80 mg total) by mouth daily at 6 PM.   30 tablet   3   . clopidogrel (PLAVIX) 75 MG tablet   Oral   Take 75 mg by mouth daily.         . metFORMIN (GLUCOPHAGE) 500 MG tablet   Oral   Take 500 mg by mouth 2 (two) times daily with a meal.          . methylcellulose (ARTIFICIAL TEARS) 1 % ophthalmic solution   Both Eyes   Place 1 drop into both eyes daily.          . metoprolol tartrate (LOPRESSOR) 25 MG tablet   Oral   Take 1 tablet (25 mg total) by mouth 2 (two) times daily.   60 tablet   3   . Multiple Vitamin (MULTIVITAMIN WITH MINERALS) TABS   Oral   Take 1 tablet by mouth daily.         Marland Kitchen  nitroGLYCERIN (NITROSTAT) 0.4 MG SL tablet   Sublingual   Place 1 tablet (0.4 mg total) under the tongue every 5 (five) minutes x 3 doses as needed for chest pain.   25 tablet   3    BP 167/82  Pulse 67  Temp(Src) 97.8 F (36.6 C) (Oral)  Resp 16  SpO2 96% Physical Exam  Nursing note and vitals reviewed. Constitutional: He is oriented to person, place, and time. He appears well-developed.  HENT:  Head: Normocephalic and atraumatic.  Eyes: Conjunctivae and EOM are normal. Pupils are equal, round, and reactive to light.  Neck: Normal range of motion. Neck supple.  Cardiovascular: Normal rate and regular rhythm.   Pulmonary/Chest: Effort normal and breath sounds normal.  Abdominal: Soft. Bowel sounds are normal. He exhibits no distension. There is no tenderness. There is no rebound and no guarding.  Neurological: He  is alert and oriented to person, place, and time. No cranial nerve deficit. Coordination normal.  Skin: Skin is warm.    ED Course  Procedures (including critical care time) Labs Reviewed  GLUCOSE, CAPILLARY - Abnormal; Notable for the following:    Glucose-Capillary 197 (*)    All other components within normal limits  MYASTHENIA GRAVIS PANEL 2   No results found. 1. Diplopia     MDM  Pt comes in with cc of diplopia. Based on exam, and the hx - the vertical diplopia of sudden onset worse with right gaze and when people are distant indicative of possible nerve palsy. Will get neuro recommendations. No acute eye pathology.  Derwood Kaplan, MD 01/31/13 [phone]

## 2013-04-08 ENCOUNTER — Other Ambulatory Visit (HOSPITAL_COMMUNITY): Payer: Self-pay | Admitting: Cardiology

## 2013-04-09 ENCOUNTER — Other Ambulatory Visit (HOSPITAL_COMMUNITY): Payer: Self-pay | Admitting: Cardiology

## 2013-04-09 DIAGNOSIS — M25569 Pain in unspecified knee: Secondary | ICD-10-CM

## 2013-04-09 DIAGNOSIS — R42 Dizziness and giddiness: Secondary | ICD-10-CM

## 2013-04-09 DIAGNOSIS — R55 Syncope and collapse: Secondary | ICD-10-CM

## 2013-04-12 ENCOUNTER — Ambulatory Visit (HOSPITAL_COMMUNITY): Payer: 59

## 2013-05-13 ENCOUNTER — Ambulatory Visit (HOSPITAL_COMMUNITY): Admission: RE | Admit: 2013-05-13 | Payer: 59 | Source: Ambulatory Visit

## 2013-05-14 ENCOUNTER — Ambulatory Visit (HOSPITAL_COMMUNITY)
Admission: RE | Admit: 2013-05-14 | Discharge: 2013-05-14 | Disposition: A | Discharge status: Home / Self Care | Payer: 59 | Source: Ambulatory Visit | Attending: Cardiology | Admitting: Cardiology

## 2013-05-14 DIAGNOSIS — M79609 Pain in unspecified limb: Secondary | ICD-10-CM | POA: Insufficient documentation

## 2013-05-14 DIAGNOSIS — R42 Dizziness and giddiness: Secondary | ICD-10-CM

## 2013-05-14 DIAGNOSIS — R55 Syncope and collapse: Secondary | ICD-10-CM

## 2013-05-14 DIAGNOSIS — I6529 Occlusion and stenosis of unspecified carotid artery: Secondary | ICD-10-CM | POA: Insufficient documentation

## 2013-05-14 DIAGNOSIS — M25569 Pain in unspecified knee: Secondary | ICD-10-CM

## 2013-05-14 DIAGNOSIS — I658 Occlusion and stenosis of other precerebral arteries: Secondary | ICD-10-CM | POA: Insufficient documentation

## 2013-05-14 NOTE — Progress Notes (Signed)
VASCULAR LAB PRELIMINARY  ARTERIAL  ABI completed:    RIGHT    LEFT    PRESSURE WAVEFORM  PRESSURE WAVEFORM  BRACHIAL 159 Triphasic BRACHIAL 172 Triphasic  DP 191 Triphasic DP 184 Triphasic  PT 166 Triphasic PT 195 Triphasic    RIGHT LEFT  ABI 1.11 1.13   ABIs and Doppler waveforms are within normal limits bilaterally.  Alexander Donaldson, RVS 05/14/2013, 2:10 PM

## 2013-05-14 NOTE — Progress Notes (Signed)
VASCULAR LAB PRELIMINARY  PRELIMINARY  PRELIMINARY  PRELIMINARY  Carotid duplex completed.    Preliminary report:  Bilateral:  1-39% ICA stenosis. Moderate stenosis of the right external carotid artery and a mild stenosis of the left external carotid artery. Vertebral artery flow is antegrade.     Yeilin Zweber, RVS 05/14/2013, 1:39 PM

## 2013-06-01 ENCOUNTER — Emergency Department (HOSPITAL_COMMUNITY)
Admission: EM | Admit: 2013-06-01 | Discharge: 2013-06-01 | Disposition: A | Discharge status: Home / Self Care | Payer: 59 | Source: Home / Self Care | Attending: Family Medicine | Admitting: Family Medicine

## 2013-06-01 ENCOUNTER — Encounter (HOSPITAL_COMMUNITY): Payer: Self-pay | Admitting: Emergency Medicine

## 2013-06-01 DIAGNOSIS — R1032 Left lower quadrant pain: Secondary | ICD-10-CM

## 2013-06-01 HISTORY — DX: Diverticulitis of intestine, part unspecified, without perforation or abscess without bleeding: K57.92

## 2013-06-01 MED ORDER — POLYETHYLENE GLYCOL 3350 17 GM/SCOOP PO POWD: 1.0000 | Freq: Once | ORAL | Status: DC

## 2013-06-01 NOTE — ED Notes (Signed)
Pt reports hx of diverticulitis with recent left sided pain x 3 weeks. Pt has been using Metamucil but sometimes forgets to take it. Denies n/v/d. Reports he has been taking Tylenol and relieves pain but feels more pain when he moves around and stands. Pt is alert and oriented and in no acute distress.

## 2013-06-01 NOTE — ED Provider Notes (Signed)
Alexander Donaldson is a 79 y.o. male who presents to Urgent Care today for intermittent left lower corner and abdominal pain present off and on for the last 4 weeks. Patient denies any diarrhea nausea or vomiting. The pain is active and worse with activity. He denies any radiating pain. He denies any blood in the stool. He has not changed medications. His remote history of constipation with possible diverticulitis. He has never had a colonoscopy.   Past Medical History  Diagnosis Date  . Diabetes mellitus   . Hypertension   . Coronary artery disease   . Diverticulitis    History  Substance Use Topics  . Smoking status: Never Smoker   . Smokeless tobacco: Never Used  . Alcohol Use: No     Comment: social   ROS as above Medications reviewed. No current facility-administered medications for this encounter.   Current Outpatient Prescriptions  Medication Sig Dispense Refill  . amLODipine (NORVASC) 5 MG tablet Take 5 mg by mouth at bedtime.      Marland Kitchen aspirin 81 MG tablet Take 81 mg by mouth daily.        Marland Kitchen atorvastatin (LIPITOR) 80 MG tablet Take 1 tablet (80 mg total) by mouth daily at 6 PM.  30 tablet  3  . clopidogrel (PLAVIX) 75 MG tablet Take 75 mg by mouth daily.      . metFORMIN (GLUCOPHAGE) 500 MG tablet Take 500 mg by mouth 2 (two) times daily with a meal.       . methylcellulose (ARTIFICIAL TEARS) 1 % ophthalmic solution Place 1 drop into both eyes daily.       . metoprolol tartrate (LOPRESSOR) 25 MG tablet Take 1 tablet (25 mg total) by mouth 2 (two) times daily.  60 tablet  3  . Multiple Vitamin (MULTIVITAMIN WITH MINERALS) TABS Take 1 tablet by mouth daily.      . nitroGLYCERIN (NITROSTAT) 0.4 MG SL tablet Place 1 tablet (0.4 mg total) under the tongue every 5 (five) minutes x 3 doses as needed for chest pain.  25 tablet  3  . polyethylene glycol powder (GLYCOLAX/MIRALAX) powder Take 255 g (1 Container total) by mouth once.  850 g  1    Exam:  BP 175/67  Pulse 68  Temp(Src) 98  F (36.7 C) (Oral)  Resp 18  SpO2 100% Gen: Well NAD nontoxic appearing HEENT: EOMI,  MMM Lungs: Normal work of breathing. CTABL Heart: RRR no MRG Abd: NABS, Soft. NT, ND, no CV angle tenderness to percussion Exts: Non edematous BL  LE, warm and well perfused.   No results found for this or any previous visit (from the past 24 hour(s)). No results found.  Assessment and Plan: 79 y.o. male with unclear etiology of intermittent left lower quadrant abdominal pain. Patient has a history of constipation. Plan to use MiraLAX. Additionally I feel the patient will be best served by follow up with primary care provider and possible colonoscopy. Followup with pcp.  Discussed warning signs or symptoms. Please see discharge instructions. Patient expresses understanding.      Rodolph Bong, MD 06/01/13 1630

## 2013-06-21 ENCOUNTER — Other Ambulatory Visit (HOSPITAL_COMMUNITY): Payer: Self-pay | Admitting: Cardiology

## 2013-06-21 ENCOUNTER — Ambulatory Visit (HOSPITAL_COMMUNITY)
Admission: RE | Admit: 2013-06-21 | Discharge: 2013-06-21 | Disposition: A | Discharge status: Home / Self Care | Payer: 59 | Source: Ambulatory Visit | Attending: Cardiology | Admitting: Cardiology

## 2013-06-21 DIAGNOSIS — R109 Unspecified abdominal pain: Secondary | ICD-10-CM | POA: Insufficient documentation

## 2013-06-24 ENCOUNTER — Other Ambulatory Visit (HOSPITAL_COMMUNITY): Payer: Self-pay | Admitting: Cardiology

## 2013-06-24 DIAGNOSIS — R109 Unspecified abdominal pain: Secondary | ICD-10-CM

## 2013-06-27 ENCOUNTER — Ambulatory Visit (HOSPITAL_COMMUNITY)
Admission: RE | Admit: 2013-06-27 | Discharge: 2013-06-27 | Disposition: A | Discharge status: Home / Self Care | Payer: 59 | Source: Ambulatory Visit | Attending: Cardiology | Admitting: Cardiology

## 2013-06-27 DIAGNOSIS — R109 Unspecified abdominal pain: Secondary | ICD-10-CM | POA: Insufficient documentation

## 2013-06-27 DIAGNOSIS — N200 Calculus of kidney: Secondary | ICD-10-CM | POA: Insufficient documentation

## 2013-06-27 DIAGNOSIS — K8020 Calculus of gallbladder without cholecystitis without obstruction: Secondary | ICD-10-CM | POA: Insufficient documentation

## 2013-06-27 DIAGNOSIS — N281 Cyst of kidney, acquired: Secondary | ICD-10-CM | POA: Insufficient documentation

## 2014-03-28 ENCOUNTER — Encounter (HOSPITAL_COMMUNITY): Payer: Self-pay | Admitting: Emergency Medicine

## 2014-03-28 ENCOUNTER — Emergency Department (HOSPITAL_COMMUNITY)
Admission: EM | Admit: 2014-03-28 | Discharge: 2014-03-28 | Disposition: A | Discharge status: Home / Self Care | Payer: 59 | Attending: Emergency Medicine | Admitting: Emergency Medicine

## 2014-03-28 DIAGNOSIS — I10 Essential (primary) hypertension: Secondary | ICD-10-CM | POA: Diagnosis not present

## 2014-03-28 DIAGNOSIS — I499 Cardiac arrhythmia, unspecified: Secondary | ICD-10-CM | POA: Diagnosis present

## 2014-03-28 DIAGNOSIS — Z8719 Personal history of other diseases of the digestive system: Secondary | ICD-10-CM | POA: Diagnosis not present

## 2014-03-28 DIAGNOSIS — Z9889 Other specified postprocedural states: Secondary | ICD-10-CM | POA: Diagnosis not present

## 2014-03-28 DIAGNOSIS — E119 Type 2 diabetes mellitus without complications: Secondary | ICD-10-CM | POA: Diagnosis not present

## 2014-03-28 DIAGNOSIS — R002 Palpitations: Secondary | ICD-10-CM | POA: Insufficient documentation

## 2014-03-28 DIAGNOSIS — R739 Hyperglycemia, unspecified: Secondary | ICD-10-CM

## 2014-03-28 DIAGNOSIS — I2510 Atherosclerotic heart disease of native coronary artery without angina pectoris: Secondary | ICD-10-CM | POA: Diagnosis not present

## 2014-03-28 DIAGNOSIS — Z79899 Other long term (current) drug therapy: Secondary | ICD-10-CM | POA: Diagnosis not present

## 2014-03-28 LAB — CBC
HCT: 43.1 % (ref 39.0–52.0)
Hemoglobin: 15.8 g/dL (ref 13.0–17.0)
MCH: 30.3 pg (ref 26.0–34.0)
MCHC: 36.7 g/dL — ABNORMAL HIGH (ref 30.0–36.0)
MCV: 82.7 fL (ref 78.0–100.0)
PLATELETS: 205 10*3/uL (ref 150–400)
RBC: 5.21 MIL/uL (ref 4.22–5.81)
RDW: 11.9 % (ref 11.5–15.5)
WBC: 6.2 10*3/uL (ref 4.0–10.5)

## 2014-03-28 LAB — COMPREHENSIVE METABOLIC PANEL
ALBUMIN: 3.9 g/dL (ref 3.5–5.2)
ALK PHOS: 86 U/L (ref 39–117)
ALT: 18 U/L (ref 0–53)
AST: 23 U/L (ref 0–37)
Anion gap: 11 (ref 5–15)
BILIRUBIN TOTAL: 1 mg/dL (ref 0.3–1.2)
BUN: 11 mg/dL (ref 6–23)
CHLORIDE: 98 meq/L (ref 96–112)
CO2: 28 mEq/L (ref 19–32)
Calcium: 8.7 mg/dL (ref 8.4–10.5)
Creatinine, Ser: 0.82 mg/dL (ref 0.50–1.35)
GFR calc Af Amer: >90 mL/min (ref >90)
GFR calc non Af Amer: 83 mL/min — ABNORMAL LOW (ref >90)
Glucose, Bld: 226 mg/dL — ABNORMAL HIGH (ref 70–99)
POTASSIUM: 3.5 meq/L — AB (ref 3.7–5.3)
Sodium: 137 mEq/L (ref 137–147)
Total Protein: 7.5 g/dL (ref 6.0–8.3)

## 2014-03-28 LAB — I-STAT TROPONIN, ED: TROPONIN I, POC: 0.01 ng/mL (ref 0.00–0.08)

## 2014-03-28 NOTE — ED Provider Notes (Signed)
History/physical exam/procedure(s) were performed by non-physician practitioner and as supervising physician I was immediately available for consultation/collaboration. I have reviewed all notes and am in agreement with care and plan.   Hilario Quarry, MD 03/28/14 [phone]

## 2014-03-28 NOTE — ED Provider Notes (Signed)
CSN: [id number]     Arrival date & time 03/28/14  0940 History   First MD Initiated Contact with Patient 03/28/14 1010     Chief Complaint  Patient presents with  . Irregular Heart Beat     (Consider location/radiation/quality/duration/timing/severity/associated sxs/prior Treatment) HPI  Alexander Donaldson Is a 80 year old male with a past medical history of diabetes, hypertension, coronary artery disease, status post 5 stent placement, followed by Dr. Sharyn Lull. The patient presents the emergency department with complaint of irregular heartbeat. It has been ongoing since Thursday of last week. He states that it is worse at rest and better with exertion, he states that he has not noticed any skipping or beating when he is up and active.Marland Kitchen He denies any chest pain, shortness of breath, dyspnea on exertion, feelings of presyncope or dizziness. He denies any changes in medications, excessive caffeine intake, use of over-the-counter cold medications or other stimulants. Patient states that when he had his stents placed he was having palpitations but he also had associated severe chest pain. He denies any nausea, diaphoresis, chest pressure. Denies fevers, chills, myalgias, arthralgias.Denies dysuria, flank pain, suprapubic pain, frequency, urgency, or hematuria. Denies headaches, light headedness, weakness, visual disturbances. Denies abdominal pain, nausea, vomiting, diarrhea or constipation.    Past Medical History  Diagnosis Date  . Diabetes mellitus   . Hypertension   . Coronary artery disease   . Diverticulitis    Past Surgical History  Procedure Laterality Date  . Angioplasty    . Tonsillectomy    . Coronary stents     No family history on file. History  Substance Use Topics  . Smoking status: Never Smoker   . Smokeless tobacco: Never Used  . Alcohol Use: No     Comment: social    Review of Systems  Ten systems reviewed and are negative for acute change, except as noted in the  HPI.    Allergies  Review of patient's allergies indicates no known allergies.  Home Medications   Prior to Admission medications   Medication Sig Start Date End Date Taking? Authorizing Provider  amLODipine (NORVASC) 5 MG tablet Take 5 mg by mouth 2 (two) times daily.    Yes Historical Provider, MD  aspirin 81 MG tablet Take 81 mg by mouth daily.     Yes Historical Provider, MD  atorvastatin (LIPITOR) 80 MG tablet Take 80 mg by mouth at bedtime.   Yes Historical Provider, MD  glipiZIDE (GLUCOTROL XL) 10 MG 24 hr tablet Take 10 mg by mouth 2 (two) times daily.   Yes Historical Provider, MD  metFORMIN (GLUCOPHAGE-XR) 500 MG 24 hr tablet Take 500 mg by mouth 2 (two) times daily.   Yes Historical Provider, MD  metoprolol tartrate (LOPRESSOR) 25 MG tablet Take 25 mg by mouth 2 (two) times daily.   Yes Historical Provider, MD  Multiple Vitamin (MULTIVITAMIN WITH MINERALS) TABS Take 1 tablet by mouth daily.   Yes Historical Provider, MD  psyllium (HYDROCIL/METAMUCIL) 95 % PACK Take 1 packet by mouth daily.   Yes Historical Provider, MD  nitroGLYCERIN (NITROSTAT) 0.4 MG SL tablet Place 1 tablet (0.4 mg total) under the tongue every 5 (five) minutes x 3 doses as needed for chest pain. 03/18/12 03/18/13  Robynn Pane, MD   BP 144/70  Pulse 55  Temp(Src) 98.4 F (36.9 C) (Oral)  Resp 19  Ht  (1.651 m)  Wt 180 lb (81.647 kg)  BMI 29.95 kg/m2  SpO2 96% Physical Exam  Nursing note and vitals reviewed. Constitutional: He appears well-developed and well-nourished. No distress.  HENT:  Head: Normocephalic and atraumatic.  Eyes: Conjunctivae are normal. No scleral icterus.  Neck: Normal range of motion. Neck supple.  Cardiovascular: Normal rate, regular rhythm and normal heart sounds.   Pulmonary/Chest: Effort normal and breath sounds normal. No respiratory distress.  Abdominal: Soft. There is no tenderness.  Musculoskeletal: He exhibits no edema.  Neurological: He is alert.  Skin: Skin  is warm and dry. He is not diaphoretic.  Psychiatric: His behavior is normal.    ED Course  Procedures (including critical care time) Labs Review Labs Reviewed  CBC - Abnormal; Notable for the following:    MCHC 36.7 (*)    All other components within normal limits  COMPREHENSIVE METABOLIC PANEL - Abnormal; Notable for the following:    Potassium 3.5 (*)    Glucose, Bld 226 (*)    GFR calc non Af Amer 83 (*)    All other components within normal limits  I-STAT TROPOININ, ED    Imaging Review No results found.   EKG Interpretation None       Date: 03/28/2014  Rate: 62  Rhythm: normal sinus rhythm  QRS Axis: normal  Intervals: normal  ST/T Wave abnormalities: normal  Conduction Disutrbances:none  Narrative Interpretation:   Old EKG Reviewed: unchanged   MDM   Final diagnoses:  Intermittent palpitations  Hyperglycemia   Patient seen in shared visit with attending physician.  Patient with intermittent ventricular bigeminy. NSR currently. Negative  istat troponin (0.01) i have spoken with Dr. Sharyn Lull who states the patient may follow up with him next week. No chest pain, no sob. Ambulatory   Chest pain is not likely of cardiac or pulmonary etiology d/t presentation, perc negative, VSS, no tracheal deviation, no JVD or new murmur, RRR, breath sounds equal bilaterally, EKG without acute abnormalities, negative troponin, and negative CXR. Pt has been advised start a PPI and return to the ED is CP becomes exertional, associated with diaphoresis or nausea, radiates to left jaw/arm, worsens or becomes concerning in any way. Pt appears reliable for follow up and is agreeable to discharge.   Case has been discussed with and seen by Dr. Rosalia Hammers who agrees with the above plan to discharge.      Arthor Captain, PA-C 03/28/14 [phone]

## 2014-03-28 NOTE — ED Notes (Signed)
MD at bedside. 

## 2014-03-28 NOTE — Discharge Instructions (Signed)
Follow up next week with Dr. Sharyn Lull Discontinue using caffeine or any alcohol  Palpitations A palpitation is the feeling that your heartbeat is irregular or is faster than normal. It may feel like your heart is fluttering or skipping a beat. Palpitations are usually not a serious problem. However, in some cases, you may need further medical evaluation. CAUSES  Palpitations can be caused by:  Smoking.  Caffeine or other stimulants, such as diet pills or energy drinks.  Alcohol.  Stress and anxiety.  Strenuous physical activity.  Fatigue.  Certain medicines.  Heart disease, especially if you have a history of irregular heart rhythms (arrhythmias), such as atrial fibrillation, atrial flutter, or supraventricular tachycardia.  An improperly working pacemaker or defibrillator. DIAGNOSIS  To find the cause of your palpitations, your health care provider will take your medical history and perform a physical exam. Your health care provider may also have you take a test called an ambulatory electrocardiogram (ECG). An ECG records your heartbeat patterns over a 24-hour period. You may also have other tests, such as:  Transthoracic echocardiogram (TTE). During echocardiography, sound waves are used to evaluate how blood flows through your heart.  Transesophageal echocardiogram (TEE).  Cardiac monitoring. This allows your health care provider to monitor your heart rate and rhythm in real time.  Holter monitor. This is a portable device that records your heartbeat and can help diagnose heart arrhythmias. It allows your health care provider to track your heart activity for several days, if needed.  Stress tests by exercise or by giving medicine that makes the heart beat faster. TREATMENT  Treatment of palpitations depends on the cause of your symptoms and can vary greatly. Most cases of palpitations do not require any treatment other than time, relaxation, and monitoring your symptoms. Other  causes, such as atrial fibrillation, atrial flutter, or supraventricular tachycardia, usually require further treatment. HOME CARE INSTRUCTIONS   Avoid:  Caffeinated coffee, tea, soft drinks, diet pills, and energy drinks.  Chocolate.  Alcohol.  Stop smoking if you smoke.  Reduce your stress and anxiety. Things that can help you relax include:  A method of controlling things in your body, such as your heartbeats, with your mind (biofeedback).  Yoga.  Meditation.  Physical activity such as swimming, jogging, or walking.  Get plenty of rest and sleep. SEEK MEDICAL CARE IF:   You continue to have a fast or irregular heartbeat beyond 24 hours.  Your palpitations occur more often. SEEK IMMEDIATE MEDICAL CARE IF:  You have chest pain or shortness of breath.  You have a severe headache.  You feel dizzy or you faint. MAKE SURE YOU:  Understand these instructions.  Will watch your condition.  Will get help right away if you are not doing well or get worse. Document Released: 06/24/2000 Document Revised: 07/02/2013 Document Reviewed: 08/26/2011 [facility] Patient Information 2015 [place], [place]. This information is not intended to replace advice given to you by your health care provider. Make sure you discuss any questions you have with your health care provider.  Hyperglycemia Hyperglycemia occurs when the glucose (sugar) in your blood is too high. Hyperglycemia can happen for many reasons, but it most often happens to people who do not know they have diabetes or are not managing their diabetes properly.  CAUSES  Whether you have diabetes or not, there are other causes of hyperglycemia. Hyperglycemia can occur when you have diabetes, but it can also occur in other situations that you might not be as aware of, such  as: Diabetes  If you have diabetes and are having problems controlling your blood glucose, hyperglycemia could occur because of some of the following  reasons:  Not following your meal plan.  Not taking your diabetes medications or not taking it properly.  Exercising less or doing less activity than you normally do.  Being sick. Pre-diabetes  This cannot be ignored. Before people develop Type 2 diabetes, they almost always have "pre-diabetes." This is when your blood glucose levels are higher than normal, but not yet high enough to be diagnosed as diabetes. Research has shown that some long-term damage to the body, especially the heart and circulatory system, may already be occurring during pre-diabetes. If you take action to manage your blood glucose when you have pre-diabetes, you may delay or prevent Type 2 diabetes from developing. Stress  If you have diabetes, you may be "diet" controlled or on oral medications or insulin to control your diabetes. However, you may find that your blood glucose is higher than usual in the hospital whether you have diabetes or not. This is often referred to as "stress hyperglycemia." Stress can elevate your blood glucose. This happens because of hormones put out by the body during times of stress. If stress has been the cause of your high blood glucose, it can be followed regularly by your caregiver. That way he/she can make sure your hyperglycemia does not continue to get worse or progress to diabetes. Steroids  Steroids are medications that act on the infection fighting system (immune system) to block inflammation or infection. One side effect can be a rise in blood glucose. Most people can produce enough extra insulin to allow for this rise, but for those who cannot, steroids make blood glucose levels go even higher. It is not unusual for steroid treatments to "uncover" diabetes that is developing. It is not always possible to determine if the hyperglycemia will go away after the steroids are stopped. A special blood test called an A1c is sometimes done to determine if your blood glucose was elevated before  the steroids were started. SYMPTOMS  Thirsty.  Frequent urination.  Dry mouth.  Blurred vision.  Tired or fatigue.  Weakness.  Sleepy.  Tingling in feet or leg. DIAGNOSIS  Diagnosis is made by monitoring blood glucose in one or all of the following ways:  A1c test. This is a chemical found in your blood.  Fingerstick blood glucose monitoring.  Laboratory results. TREATMENT  First, knowing the cause of the hyperglycemia is important before the hyperglycemia can be treated. Treatment may include, but is not be limited to:  Education.  Change or adjustment in medications.  Change or adjustment in meal plan.  Treatment for an illness, infection, etc.  More frequent blood glucose monitoring.  Change in exercise plan.  Decreasing or stopping steroids.  Lifestyle changes. HOME CARE INSTRUCTIONS   Test your blood glucose as directed.  Exercise regularly. Your caregiver will give you instructions about exercise. Pre-diabetes or diabetes which comes on with stress is helped by exercising.  Eat wholesome, balanced meals. Eat often and at regular, fixed times. Your caregiver or nutritionist will give you a meal plan to guide your sugar intake.  Being at an ideal weight is important. If needed, losing as little as 10 to 15 pounds may help improve blood glucose levels. SEEK MEDICAL CARE IF:   You have questions about medicine, activity, or diet.  You continue to have symptoms (problems such as increased thirst, urination, or weight gain). SEEK  IMMEDIATE MEDICAL CARE IF:   You are vomiting or have diarrhea.  Your breath smells fruity.  You are breathing faster or slower.  You are very sleepy or incoherent.  You have numbness, tingling, or pain in your feet or hands.  You have chest pain.  Your symptoms get worse even though you have been following your caregiver's orders.  If you have any other questions or concerns. Document Released: 12/21/2000 Document  Revised: 09/19/2011 Document Reviewed: 10/24/2011 [facility] Patient Information 2015 [place], [place]. This information is not intended to replace advice given to you by your health care provider. Make sure you discuss any questions you have with your health care provider.

## 2014-03-28 NOTE — ED Notes (Signed)
Pt reports feeling like his heart is skipping beats, onset yesterday. Pt was doing yard work at the time. Pt has history of 5 stents and symptoms similar to when he needed the stents placed. Pt denies pain.

## 2014-04-02 ENCOUNTER — Other Ambulatory Visit (HOSPITAL_COMMUNITY): Payer: Self-pay | Admitting: Cardiology

## 2014-04-02 DIAGNOSIS — R079 Chest pain, unspecified: Secondary | ICD-10-CM

## 2014-04-07 ENCOUNTER — Encounter (HOSPITAL_COMMUNITY)
Admission: RE | Admit: 2014-04-07 | Discharge: 2014-04-07 | Disposition: A | Discharge status: Home / Self Care | Payer: 59 | Source: Ambulatory Visit | Attending: Diagnostic Radiology | Admitting: Diagnostic Radiology

## 2014-04-07 ENCOUNTER — Encounter (HOSPITAL_COMMUNITY): Admission: RE | Admit: 2014-04-07 | Payer: 59 | Source: Ambulatory Visit

## 2014-04-07 ENCOUNTER — Encounter (HOSPITAL_COMMUNITY)
Admission: RE | Admit: 2014-04-07 | Discharge: 2014-04-07 | Disposition: A | Discharge status: Home / Self Care | Payer: 59 | Source: Ambulatory Visit | Attending: Cardiology | Admitting: Cardiology

## 2014-04-07 DIAGNOSIS — R079 Chest pain, unspecified: Secondary | ICD-10-CM | POA: Diagnosis present

## 2014-04-07 DIAGNOSIS — I5189 Other ill-defined heart diseases: Secondary | ICD-10-CM | POA: Diagnosis not present

## 2014-04-07 MED ORDER — TECHNETIUM TC 99M SESTAMIBI GENERIC - CARDIOLITE
10.0000 | Freq: Once | INTRAVENOUS | Status: AC | PRN
  Administered 2014-04-07: 10 via INTRAVENOUS

## 2014-04-07 MED ORDER — REGADENOSON 0.4 MG/5ML IV SOLN
0.4000 mg | Freq: Once | INTRAVENOUS | Status: AC
  Administered 2014-04-07: 0.4 mg via INTRAVENOUS

## 2014-04-07 MED ORDER — REGADENOSON 0.4 MG/5ML IV SOLN
INTRAVENOUS | Status: AC
  Filled 2014-04-07: qty 5

## 2014-04-07 MED ORDER — TECHNETIUM TC 99M SESTAMIBI GENERIC - CARDIOLITE
30.0000 | Freq: Once | INTRAVENOUS | Status: AC | PRN
  Administered 2014-04-07: 30 via INTRAVENOUS

## 2014-06-19 ENCOUNTER — Encounter (HOSPITAL_COMMUNITY): Payer: Self-pay | Admitting: Cardiology

## 2015-07-13 MED FILL — glipiZIDE XL 10 MG TB24: 10 | 90 days supply | Qty: 180 | Fill #3

## 2015-07-13 MED FILL — TRUE METRIX GLUCOSE TEST ST: 30 days supply | Qty: 50 | Fill #3

## 2015-07-13 MED FILL — METOPROLOL SUCC ER 25 MG TA: 25 | 30 days supply | Qty: 30 | Fill #1

## 2015-07-13 MED FILL — LOSARTAN POTASSIUM 100 MG T: 100 | 30 days supply | Qty: 30 | Fill #2

## 2015-07-13 MED FILL — CLOPIDOGREL 75 MG TABLET: 75 | 30 days supply | Qty: 30 | Fill #2

## 2015-08-05 DIAGNOSIS — E785 Hyperlipidemia, unspecified: Secondary | ICD-10-CM | POA: Diagnosis not present

## 2015-08-05 DIAGNOSIS — E1169 Type 2 diabetes mellitus with other specified complication: Secondary | ICD-10-CM | POA: Diagnosis not present

## 2015-08-05 DIAGNOSIS — I10 Essential (primary) hypertension: Secondary | ICD-10-CM | POA: Diagnosis not present

## 2015-08-05 DIAGNOSIS — M1711 Unilateral primary osteoarthritis, right knee: Secondary | ICD-10-CM | POA: Diagnosis not present

## 2015-08-05 DIAGNOSIS — R809 Proteinuria, unspecified: Secondary | ICD-10-CM | POA: Diagnosis not present

## 2015-08-06 MED FILL — ATORVASTATIN 80 MG TABLET: 80 | 90 days supply | Qty: 90 | Fill #2

## 2015-08-11 MED FILL — METOPROLOL SUCC ER 25 MG TA: 25 | 30 days supply | Qty: 30 | Fill #2

## 2015-08-11 MED FILL — LOSARTAN POTASSIUM 100 MG T: 100 | 30 days supply | Qty: 30 | Fill #3

## 2015-08-11 MED FILL — CLOPIDOGREL 75 MG TABLET: 75 | 30 days supply | Qty: 30 | Fill #3

## 2015-08-13 DIAGNOSIS — I255 Ischemic cardiomyopathy: Secondary | ICD-10-CM | POA: Diagnosis not present

## 2015-08-13 DIAGNOSIS — I252 Old myocardial infarction: Secondary | ICD-10-CM | POA: Diagnosis not present

## 2015-08-13 DIAGNOSIS — Z8673 Personal history of transient ischemic attack (TIA), and cerebral infarction without residual deficits: Secondary | ICD-10-CM | POA: Diagnosis not present

## 2015-08-13 DIAGNOSIS — I2510 Atherosclerotic heart disease of native coronary artery without angina pectoris: Secondary | ICD-10-CM | POA: Diagnosis not present

## 2015-08-13 MED FILL — SPIRONOLACTONE 25 MG TABLET: 25 | 60 days supply | Qty: 30 | Fill #0

## 2015-08-26 MED FILL — TRUE METRIX GLUCOSE TEST ST: 25 days supply | Qty: 50 | Fill #0

## 2015-09-01 MED FILL — AMLODIPINE BESYLATE 5 MG TA: 5 | 90 days supply | Qty: 180 | Fill #1

## 2015-09-14 MED FILL — METOPROLOL SUCC ER 25 MG TA: 25 | 30 days supply | Qty: 30 | Fill #3

## 2015-09-17 MED FILL — CLOPIDOGREL 75 MG TABLET: 75 | 30 days supply | Qty: 30 | Fill #0

## 2015-09-17 MED FILL — LOSARTAN POTASSIUM 100 MG T: 100 | 30 days supply | Qty: 30 | Fill #0

## 2015-10-19 MED FILL — LOSARTAN POTASSIUM 100 MG T: 100 | 30 days supply | Qty: 30 | Fill #1

## 2015-10-19 MED FILL — glipiZIDE XL 10 MG TB24: 10 | 90 days supply | Qty: 180 | Fill #0 | Status: TO

## 2015-10-19 MED FILL — METOPROLOL SUCC ER 25 MG TA: 25 | 30 days supply | Qty: 30 | Fill #0 | Status: TO

## 2015-10-29 MED FILL — CLOPIDOGREL 75 MG TABLET: 75 | 30 days supply | Qty: 30 | Fill #1

## 2015-11-09 DIAGNOSIS — I252 Old myocardial infarction: Secondary | ICD-10-CM | POA: Diagnosis not present

## 2015-11-09 DIAGNOSIS — I255 Ischemic cardiomyopathy: Secondary | ICD-10-CM | POA: Diagnosis not present

## 2015-11-09 DIAGNOSIS — I10 Essential (primary) hypertension: Secondary | ICD-10-CM | POA: Diagnosis not present

## 2015-11-09 DIAGNOSIS — I2510 Atherosclerotic heart disease of native coronary artery without angina pectoris: Secondary | ICD-10-CM | POA: Diagnosis not present

## 2015-11-09 MED FILL — TRUE METRIX GLUCOSE TEST ST: 25 days supply | Qty: 50 | Fill #1

## 2015-11-20 MED FILL — METOPROLOL SUCC ER 25 MG TA: 25 | 30 days supply | Qty: 30 | Fill #1 | Status: TO

## 2015-11-20 MED FILL — LOSARTAN POTASSIUM 100 MG T: 100 | 30 days supply | Qty: 30 | Fill #2

## 2015-11-24 MED FILL — CLOPIDOGREL 75 MG TABLET: 75 | 30 days supply | Qty: 30 | Fill #2

## 2015-12-02 MED FILL — AMLODIPINE BESYLATE 5 MG TA: 5 | 90 days supply | Qty: 180 | Fill #2

## 2015-12-14 MED FILL — TRUE METRIX GLUCOSE TEST ST: 25 days supply | Qty: 50 | Fill #2

## 2015-12-21 MED FILL — CLOPIDOGREL 75 MG TABLET: 75 | 30 days supply | Qty: 30 | Fill #3

## 2015-12-21 MED FILL — LOSARTAN POTASSIUM 100 MG T: 100 | 30 days supply | Qty: 30 | Fill #3

## 2015-12-21 MED FILL — METOPROLOL SUCC ER 25 MG TA: 25 | 30 days supply | Qty: 30 | Fill #2 | Status: TO

## 2016-01-15 MED FILL — TRUE METRIX GLUCOSE TEST ST: 25 days supply | Qty: 50 | Fill #3

## 2016-01-22 MED FILL — METOPROLOL SUCC ER 25 MG TA: 25 | 27 days supply | Qty: 27 | Fill #0

## 2016-01-22 MED FILL — CLOPIDOGREL 75 MG TABLET: 75 | 30 days supply | Qty: 30 | Fill #0

## 2016-01-22 MED FILL — glipiZIDE XL 10 MG TB24: 10 | 90 days supply | Qty: 180 | Fill #0

## 2016-01-25 MED FILL — LOSARTAN POTASSIUM 100 MG T: 100 | 30 days supply | Qty: 30 | Fill #0

## 2016-02-25 DIAGNOSIS — I2510 Atherosclerotic heart disease of native coronary artery without angina pectoris: Secondary | ICD-10-CM | POA: Diagnosis not present

## 2016-02-25 DIAGNOSIS — I10 Essential (primary) hypertension: Secondary | ICD-10-CM | POA: Diagnosis not present

## 2016-02-25 DIAGNOSIS — I252 Old myocardial infarction: Secondary | ICD-10-CM | POA: Diagnosis not present

## 2016-02-25 DIAGNOSIS — I255 Ischemic cardiomyopathy: Secondary | ICD-10-CM | POA: Diagnosis not present

## 2016-02-25 MED FILL — LOSARTAN POTASSIUM 100 MG T: 100 | 90 days supply | Qty: 90 | Fill #1

## 2016-02-25 MED FILL — METOPROLOL SUCC ER 25 MG TA: 25 | 30 days supply | Qty: 30 | Fill #0

## 2016-02-25 MED FILL — CLOPIDOGREL 75 MG TABLET: 75 | 90 days supply | Qty: 90 | Fill #1

## 2016-02-25 MED FILL — TRUE METRIX GLUCOSE TEST ST: 25 days supply | Qty: 50 | Fill #0

## 2016-03-09 DIAGNOSIS — E113293 Type 2 diabetes mellitus with mild nonproliferative diabetic retinopathy without macular edema, bilateral: Secondary | ICD-10-CM | POA: Diagnosis not present

## 2016-03-09 DIAGNOSIS — H524 Presbyopia: Secondary | ICD-10-CM | POA: Diagnosis not present

## 2016-03-09 DIAGNOSIS — Z7984 Long term (current) use of oral hypoglycemic drugs: Secondary | ICD-10-CM | POA: Diagnosis not present

## 2016-03-11 MED FILL — AMLODIPINE BESYLATE 5 MG TA: 5 | 90 days supply | Qty: 180 | Fill #3

## 2016-03-22 MED FILL — METOPROLOL SUCC ER 25 MG TA: 25 | 30 days supply | Qty: 30 | Fill #1

## 2016-03-30 DIAGNOSIS — I2510 Atherosclerotic heart disease of native coronary artery without angina pectoris: Secondary | ICD-10-CM | POA: Diagnosis not present

## 2016-03-30 DIAGNOSIS — I10 Essential (primary) hypertension: Secondary | ICD-10-CM | POA: Diagnosis not present

## 2016-03-30 DIAGNOSIS — E785 Hyperlipidemia, unspecified: Secondary | ICD-10-CM | POA: Diagnosis not present

## 2016-03-30 DIAGNOSIS — Z719 Counseling, unspecified: Secondary | ICD-10-CM | POA: Diagnosis not present

## 2016-03-30 DIAGNOSIS — E11319 Type 2 diabetes mellitus with unspecified diabetic retinopathy without macular edema: Secondary | ICD-10-CM | POA: Diagnosis not present

## 2016-04-01 MED FILL — TRUE METRIX GLUCOSE TEST ST: 90 days supply | Qty: 100 | Fill #0

## 2016-04-01 MED FILL — ATORVASTATIN 80 MG TABLET: 80 | 90 days supply | Qty: 90 | Fill #0

## 2016-04-21 MED FILL — glipiZIDE XL 10 MG TB24: 10 | 90 days supply | Qty: 180 | Fill #1

## 2016-04-26 MED FILL — METOPROLOL SUCC ER 25 MG TA: 25 | 30 days supply | Qty: 30 | Fill #2

## 2016-05-27 MED FILL — CLOPIDOGREL 75 MG TABLET: 75 | 90 days supply | Qty: 90 | Fill #0

## 2016-05-27 MED FILL — LOSARTAN POTASSIUM 100 MG T: 100 | 90 days supply | Qty: 90 | Fill #0

## 2016-05-31 MED FILL — METOPROLOL SUCC ER 25 MG TA: 25 | 18 days supply | Qty: 18 | Fill #3

## 2016-06-06 DIAGNOSIS — I2510 Atherosclerotic heart disease of native coronary artery without angina pectoris: Secondary | ICD-10-CM | POA: Diagnosis not present

## 2016-06-06 DIAGNOSIS — I255 Ischemic cardiomyopathy: Secondary | ICD-10-CM | POA: Diagnosis not present

## 2016-06-06 DIAGNOSIS — I10 Essential (primary) hypertension: Secondary | ICD-10-CM | POA: Diagnosis not present

## 2016-06-06 DIAGNOSIS — I252 Old myocardial infarction: Secondary | ICD-10-CM | POA: Diagnosis not present

## 2016-06-13 MED FILL — AMLODIPINE BESYLATE 5 MG TA: 5 | 90 days supply | Qty: 180 | Fill #0

## 2016-06-15 MED FILL — METOPROLOL SUCC ER 25 MG TA: 25 | 30 days supply | Qty: 30 | Fill #0

## 2016-07-15 MED FILL — METOPROLOL SUCC ER 25 MG TA: 25 | 30 days supply | Qty: 30 | Fill #1

## 2016-07-20 MED FILL — TRUE METRIX GLUCOSE TEST ST: 90 days supply | Qty: 100 | Fill #1

## 2016-07-22 MED FILL — glipiZIDE XL 10 MG TB24: 10 | 87 days supply | Qty: 174 | Fill #2

## 2016-08-15 MED FILL — METOPROLOL SUCC ER 25 MG TA: 25 | 30 days supply | Qty: 30 | Fill #2

## 2016-08-25 DIAGNOSIS — E119 Type 2 diabetes mellitus without complications: Secondary | ICD-10-CM | POA: Diagnosis not present

## 2016-08-25 DIAGNOSIS — I2510 Atherosclerotic heart disease of native coronary artery without angina pectoris: Secondary | ICD-10-CM | POA: Diagnosis not present

## 2016-08-25 DIAGNOSIS — I10 Essential (primary) hypertension: Secondary | ICD-10-CM | POA: Diagnosis not present

## 2016-08-25 DIAGNOSIS — E785 Hyperlipidemia, unspecified: Secondary | ICD-10-CM | POA: Diagnosis not present

## 2016-08-29 DIAGNOSIS — I255 Ischemic cardiomyopathy: Secondary | ICD-10-CM | POA: Diagnosis not present

## 2016-08-29 DIAGNOSIS — I2510 Atherosclerotic heart disease of native coronary artery without angina pectoris: Secondary | ICD-10-CM | POA: Diagnosis not present

## 2016-08-29 DIAGNOSIS — I252 Old myocardial infarction: Secondary | ICD-10-CM | POA: Diagnosis not present

## 2016-08-29 DIAGNOSIS — I10 Essential (primary) hypertension: Secondary | ICD-10-CM | POA: Diagnosis not present

## 2016-09-02 MED FILL — CLOPIDOGREL 75 MG TABLET: 75 | 90 days supply | Qty: 90 | Fill #1

## 2016-09-02 MED FILL — ATORVASTATIN 80 MG TABLET: 80 | 90 days supply | Qty: 90 | Fill #1

## 2016-09-08 MED FILL — AMLODIPINE BESYLATE 5 MG TA: 5 | 90 days supply | Qty: 180 | Fill #1

## 2016-09-08 MED FILL — METOPROLOL SUCC ER 25 MG TA: 25 | 30 days supply | Qty: 30 | Fill #3

## 2016-09-21 MED FILL — LOSARTAN POTASSIUM 100 MG T: 100 | 90 days supply | Qty: 90 | Fill #1

## 2016-10-17 MED FILL — METOPROLOL SUCC ER 25 MG TA: 25 | 90 days supply | Qty: 90 | Fill #0

## 2016-10-31 MED FILL — glipiZIDE XL 5 MG TB24: 5 | 90 days supply | Qty: 360 | Fill #0

## 2016-11-07 DIAGNOSIS — E119 Type 2 diabetes mellitus without complications: Secondary | ICD-10-CM | POA: Diagnosis not present

## 2016-11-07 DIAGNOSIS — Z1389 Encounter for screening for other disorder: Secondary | ICD-10-CM | POA: Diagnosis not present

## 2016-11-07 DIAGNOSIS — I10 Essential (primary) hypertension: Secondary | ICD-10-CM | POA: Diagnosis not present

## 2016-11-07 DIAGNOSIS — E11311 Type 2 diabetes mellitus with unspecified diabetic retinopathy with macular edema: Secondary | ICD-10-CM | POA: Diagnosis not present

## 2016-11-07 DIAGNOSIS — E1129 Type 2 diabetes mellitus with other diabetic kidney complication: Secondary | ICD-10-CM | POA: Diagnosis not present

## 2016-11-07 DIAGNOSIS — E785 Hyperlipidemia, unspecified: Secondary | ICD-10-CM | POA: Diagnosis not present

## 2016-11-10 MED FILL — FREESTYLE LANCETS: 90 days supply | Qty: 100 | Fill #0

## 2016-11-10 MED FILL — FREESTYLE LITE METER: 30 days supply | Qty: 1 | Fill #0

## 2016-11-10 MED FILL — FREESTYLE LITE TEST STRIP: 90 days supply | Qty: 100 | Fill #0

## 2016-11-16 DIAGNOSIS — I255 Ischemic cardiomyopathy: Secondary | ICD-10-CM | POA: Diagnosis not present

## 2016-11-16 DIAGNOSIS — I10 Essential (primary) hypertension: Secondary | ICD-10-CM | POA: Diagnosis not present

## 2016-11-16 DIAGNOSIS — I2510 Atherosclerotic heart disease of native coronary artery without angina pectoris: Secondary | ICD-10-CM | POA: Diagnosis not present

## 2016-11-16 DIAGNOSIS — I252 Old myocardial infarction: Secondary | ICD-10-CM | POA: Diagnosis not present

## 2016-11-25 DIAGNOSIS — M25561 Pain in right knee: Secondary | ICD-10-CM | POA: Diagnosis not present

## 2016-12-06 MED FILL — CLOPIDOGREL 75 MG TABLET: 75 | 30 days supply | Qty: 30 | Fill #0

## 2016-12-07 MED FILL — NITROGLYCERIN 0.4 MG TAB SL: 0.4 | 20 days supply | Qty: 25 | Fill #0

## 2016-12-14 MED FILL — AMLODIPINE BESYLATE 5 MG TA: 5 | 90 days supply | Qty: 180 | Fill #2

## 2017-01-02 MED FILL — CLOPIDOGREL 75 MG TABLET: 75 | 30 days supply | Qty: 30 | Fill #1

## 2017-01-09 MED FILL — METOPROLOL SUCC ER 25 MG TA: 25 | 30 days supply | Qty: 30 | Fill #1

## 2017-01-26 MED FILL — FREESTYLE LITE TEST STRIP: 90 days supply | Qty: 100 | Fill #1

## 2017-01-26 MED FILL — FREESTYLE LANCETS: 90 days supply | Qty: 100 | Fill #1

## 2017-02-01 ENCOUNTER — Encounter (HOSPITAL_COMMUNITY): Payer: Self-pay | Admitting: Emergency Medicine

## 2017-02-01 ENCOUNTER — Inpatient Hospital Stay (HOSPITAL_COMMUNITY)
Admission: EM | Admit: 2017-02-01 | Discharge: 2017-02-03 | DRG: 247 | Disposition: A | Discharge status: Home / Self Care | Payer: 59 | Attending: Cardiology | Admitting: Cardiology

## 2017-02-01 ENCOUNTER — Emergency Department (HOSPITAL_COMMUNITY): Payer: 59

## 2017-02-01 DIAGNOSIS — T82858A Stenosis of vascular prosthetic devices, implants and grafts, initial encounter: Secondary | ICD-10-CM | POA: Diagnosis present

## 2017-02-01 DIAGNOSIS — Z7982 Long term (current) use of aspirin: Secondary | ICD-10-CM

## 2017-02-01 DIAGNOSIS — Y832 Surgical operation with anastomosis, bypass or graft as the cause of abnormal reaction of the patient, or of later complication, without mention of misadventure at the time of the procedure: Secondary | ICD-10-CM | POA: Diagnosis present

## 2017-02-01 DIAGNOSIS — Z7984 Long term (current) use of oral hypoglycemic drugs: Secondary | ICD-10-CM

## 2017-02-01 DIAGNOSIS — Z955 Presence of coronary angioplasty implant and graft: Secondary | ICD-10-CM | POA: Diagnosis not present

## 2017-02-01 DIAGNOSIS — E119 Type 2 diabetes mellitus without complications: Secondary | ICD-10-CM | POA: Diagnosis present

## 2017-02-01 DIAGNOSIS — Z87891 Personal history of nicotine dependence: Secondary | ICD-10-CM

## 2017-02-01 DIAGNOSIS — I959 Hypotension, unspecified: Secondary | ICD-10-CM | POA: Diagnosis not present

## 2017-02-01 DIAGNOSIS — I255 Ischemic cardiomyopathy: Secondary | ICD-10-CM | POA: Diagnosis present

## 2017-02-01 DIAGNOSIS — R55 Syncope and collapse: Secondary | ICD-10-CM | POA: Diagnosis not present

## 2017-02-01 DIAGNOSIS — N529 Male erectile dysfunction, unspecified: Secondary | ICD-10-CM | POA: Diagnosis present

## 2017-02-01 DIAGNOSIS — I252 Old myocardial infarction: Secondary | ICD-10-CM | POA: Diagnosis not present

## 2017-02-01 DIAGNOSIS — I10 Essential (primary) hypertension: Secondary | ICD-10-CM | POA: Diagnosis not present

## 2017-02-01 DIAGNOSIS — E785 Hyperlipidemia, unspecified: Secondary | ICD-10-CM | POA: Diagnosis present

## 2017-02-01 DIAGNOSIS — R000 Tachycardia, unspecified: Secondary | ICD-10-CM | POA: Diagnosis present

## 2017-02-01 DIAGNOSIS — I25119 Atherosclerotic heart disease of native coronary artery with unspecified angina pectoris: Principal | ICD-10-CM | POA: Diagnosis present

## 2017-02-01 LAB — URINALYSIS, ROUTINE W REFLEX MICROSCOPIC
BACTERIA UA: NONE SEEN
Bilirubin Urine: NEGATIVE
Glucose, UA: 50 mg/dL — AB
Hgb urine dipstick: NEGATIVE
KETONES UR: NEGATIVE mg/dL
LEUKOCYTES UA: NEGATIVE
NITRITE: NEGATIVE
PH: 7 (ref 5.0–8.0)
Protein, ur: 30 mg/dL — AB
Specific Gravity, Urine: 1.014 (ref 1.005–1.030)
Squamous Epithelial / LPF: NONE SEEN

## 2017-02-01 LAB — BASIC METABOLIC PANEL
Anion gap: 8 (ref 5–15)
BUN: 12 mg/dL (ref 6–20)
CALCIUM: 8.8 mg/dL — AB (ref 8.9–10.3)
CO2: 28 mmol/L (ref 22–32)
Chloride: 102 mmol/L (ref 101–111)
Creatinine, Ser: 0.95 mg/dL (ref 0.61–1.24)
GFR calc Af Amer: >60 mL/min (ref >60)
Glucose, Bld: 175 mg/dL — ABNORMAL HIGH (ref 65–99)
Potassium: 3.4 mmol/L — ABNORMAL LOW (ref 3.5–5.1)
SODIUM: 138 mmol/L (ref 135–145)

## 2017-02-01 LAB — GLUCOSE, CAPILLARY
GLUCOSE-CAPILLARY: 163 mg/dL — AB (ref 65–99)
Glucose-Capillary: 211 mg/dL — ABNORMAL HIGH (ref 65–99)

## 2017-02-01 LAB — MAGNESIUM: Magnesium: 2.1 mg/dL (ref 1.7–2.4)

## 2017-02-01 LAB — PROTIME-INR
INR: 1.03
Prothrombin Time: 13.5 seconds (ref 11.4–15.2)

## 2017-02-01 LAB — COMPREHENSIVE METABOLIC PANEL
ALBUMIN: 3.4 g/dL — AB (ref 3.5–5.0)
ALK PHOS: 86 U/L (ref 38–126)
ALT: 36 U/L (ref 17–63)
AST: 25 U/L (ref 15–41)
Anion gap: 8 (ref 5–15)
BILIRUBIN TOTAL: 1 mg/dL (ref 0.3–1.2)
BUN: 13 mg/dL (ref 6–20)
CALCIUM: 8.9 mg/dL (ref 8.9–10.3)
CO2: 30 mmol/L (ref 22–32)
CREATININE: 1.03 mg/dL (ref 0.61–1.24)
Chloride: 100 mmol/L — ABNORMAL LOW (ref 101–111)
GFR calc Af Amer: >60 mL/min (ref >60)
GFR calc non Af Amer: >60 mL/min (ref >60)
GLUCOSE: 227 mg/dL — AB (ref 65–99)
Potassium: 3.8 mmol/L (ref 3.5–5.1)
SODIUM: 138 mmol/L (ref 135–145)
Total Protein: 6.6 g/dL (ref 6.5–8.1)

## 2017-02-01 LAB — CBC
HEMATOCRIT: 40.2 % (ref 39.0–52.0)
Hemoglobin: 14.2 g/dL (ref 13.0–17.0)
MCH: 30.1 pg (ref 26.0–34.0)
MCHC: 35.3 g/dL (ref 30.0–36.0)
MCV: 85.2 fL (ref 78.0–100.0)
Platelets: 216 10*3/uL (ref 150–400)
RBC: 4.72 MIL/uL (ref 4.22–5.81)
RDW: 12.2 % (ref 11.5–15.5)
WBC: 6.6 10*3/uL (ref 4.0–10.5)

## 2017-02-01 LAB — TROPONIN I
TROPONIN I: 0.03 ng/mL — AB (ref <0.03)
Troponin I: <0.03 ng/mL (ref <0.03)

## 2017-02-01 LAB — CBG MONITORING, ED: Glucose-Capillary: 158 mg/dL — ABNORMAL HIGH (ref 65–99)

## 2017-02-01 LAB — I-STAT TROPONIN, ED: TROPONIN I, POC: 0.02 ng/mL (ref 0.00–0.08)

## 2017-02-01 LAB — TSH: TSH: 0.599 u[IU]/mL (ref 0.350–4.500)

## 2017-02-01 LAB — MRSA PCR SCREENING: MRSA BY PCR: NEGATIVE

## 2017-02-01 MED ORDER — SODIUM CHLORIDE 0.9% FLUSH
3.0000 mL | Freq: Two times a day (BID) | INTRAVENOUS | Status: DC
  Administered 2017-02-01: 3 mL via INTRAVENOUS

## 2017-02-01 MED ORDER — GUAIFENESIN 100 MG/5ML PO SOLN
5.0000 mL | ORAL | Status: DC | PRN
  Administered 2017-02-01 – 2017-02-02 (×2): 100 mg via ORAL
  Filled 2017-02-01: qty 5

## 2017-02-01 MED ORDER — CLOPIDOGREL BISULFATE 75 MG PO TABS
75.0000 mg | ORAL_TABLET | Freq: Every day | ORAL | Status: DC
  Administered 2017-02-02 – 2017-02-03 (×2): 75 mg via ORAL
  Filled 2017-02-01 (×2): qty 1

## 2017-02-01 MED ORDER — SODIUM CHLORIDE 0.9 % IV SOLN: 250.0000 mL | INTRAVENOUS | Status: DC | PRN

## 2017-02-01 MED ORDER — HEPARIN BOLUS VIA INFUSION
4000.0000 [IU] | Freq: Once | INTRAVENOUS | Status: AC
Start: 2017-02-01 — End: 2017-02-01
  Administered 2017-02-01: 4000 [IU] via INTRAVENOUS
  Filled 2017-02-01: qty 4000

## 2017-02-01 MED ORDER — NITROGLYCERIN 2 % TD OINT
0.5000 [in_us] | TOPICAL_OINTMENT | Freq: Four times a day (QID) | TRANSDERMAL | Status: DC
  Administered 2017-02-01 – 2017-02-02 (×2): 0.5 [in_us] via TOPICAL
  Filled 2017-02-01: qty 30

## 2017-02-01 MED ORDER — SODIUM CHLORIDE 0.9 % WEIGHT BASED INFUSION
1.0000 mL/kg/h | INTRAVENOUS | Status: DC
  Administered 2017-02-01: 1 mL/kg/h via INTRAVENOUS
  Administered 2017-02-02: 250 mL via INTRAVENOUS
  Administered 2017-02-02: 1 mL/kg/h via INTRAVENOUS

## 2017-02-01 MED ORDER — ATORVASTATIN CALCIUM 80 MG PO TABS: 80.0000 mg | ORAL_TABLET | Freq: Every day | ORAL | Status: DC

## 2017-02-01 MED ORDER — SODIUM CHLORIDE 0.9% FLUSH: 3.0000 mL | INTRAVENOUS | Status: DC | PRN

## 2017-02-01 MED ORDER — NITROGLYCERIN 0.4 MG SL SUBL: 0.4000 mg | SUBLINGUAL_TABLET | SUBLINGUAL | Status: DC | PRN

## 2017-02-01 MED ORDER — ONDANSETRON HCL 4 MG/2ML IJ SOLN: 4.0000 mg | Freq: Four times a day (QID) | INTRAMUSCULAR | Status: DC | PRN

## 2017-02-01 MED ORDER — SODIUM CHLORIDE 0.9 % IV SOLN: INTRAVENOUS | Status: DC

## 2017-02-01 MED ORDER — INSULIN ASPART 100 UNIT/ML ~~LOC~~ SOLN
0.0000 [IU] | Freq: Three times a day (TID) | SUBCUTANEOUS | Status: DC
  Administered 2017-02-03: 2 [IU] via SUBCUTANEOUS

## 2017-02-01 MED ORDER — LOSARTAN POTASSIUM 25 MG PO TABS
25.0000 mg | ORAL_TABLET | Freq: Every day | ORAL | Status: DC
  Administered 2017-02-02 – 2017-02-03 (×2): 25 mg via ORAL
  Filled 2017-02-01 (×2): qty 1

## 2017-02-01 MED ORDER — POTASSIUM CHLORIDE CRYS ER 20 MEQ PO TBCR
40.0000 meq | EXTENDED_RELEASE_TABLET | Freq: Once | ORAL | Status: AC
  Administered 2017-02-01: 40 meq via ORAL
  Filled 2017-02-01: qty 2

## 2017-02-01 MED ORDER — ASPIRIN 81 MG PO CHEW: 324.0000 mg | CHEWABLE_TABLET | ORAL | Status: AC

## 2017-02-01 MED ORDER — PANTOPRAZOLE SODIUM 40 MG PO TBEC
40.0000 mg | DELAYED_RELEASE_TABLET | Freq: Every day | ORAL | Status: DC
Start: 2017-02-02 — End: 2017-02-03
  Administered 2017-02-02 – 2017-02-03 (×2): 40 mg via ORAL
  Filled 2017-02-01 (×2): qty 1

## 2017-02-01 MED ORDER — METOPROLOL TARTRATE 25 MG PO TABS
25.0000 mg | ORAL_TABLET | Freq: Two times a day (BID) | ORAL | Status: DC
  Administered 2017-02-01 – 2017-02-03 (×4): 25 mg via ORAL
  Filled 2017-02-01 (×4): qty 1

## 2017-02-01 MED ORDER — ASPIRIN EC 81 MG PO TBEC
81.0000 mg | DELAYED_RELEASE_TABLET | Freq: Every day | ORAL | Status: DC
  Administered 2017-02-02 – 2017-02-03 (×2): 81 mg via ORAL
  Filled 2017-02-01: qty 1

## 2017-02-01 MED ORDER — ASPIRIN 300 MG RE SUPP: 300.0000 mg | RECTAL | Status: AC

## 2017-02-01 MED ORDER — ATORVASTATIN CALCIUM 80 MG PO TABS
80.0000 mg | ORAL_TABLET | Freq: Every day | ORAL | Status: DC
  Administered 2017-02-01 – 2017-02-02 (×2): 80 mg via ORAL
  Filled 2017-02-01 (×2): qty 1

## 2017-02-01 MED ORDER — HEPARIN (PORCINE) IN NACL 100-0.45 UNIT/ML-% IJ SOLN
900.0000 [IU]/h | INTRAMUSCULAR | Status: DC
  Administered 2017-02-01: 900 [IU]/h via INTRAVENOUS
  Filled 2017-02-01: qty 250

## 2017-02-01 MED ORDER — ACETAMINOPHEN 325 MG PO TABS: 650.0000 mg | ORAL_TABLET | ORAL | Status: DC | PRN

## 2017-02-01 NOTE — H&P (Signed)
Alexander Donaldson is an 83 y.o. male.   Chief Complaint: Dizziness lightheadedness/near syncope HPI: Patient is 83 year old male with past medical history significant for coronary artery disease history of silent inferior wall myocardial infarction in the past status post PTCA stenting to 100% occluded RCA and PTCA stenting to obtuse marginal 3 in 2003, followed by PTCA stenting to LAD and left circumflex in 2013, hypertension, non-insulin-dependent diabetes mellitus, hyperlipidemia, remote tobacco abuse, erectile dysfunction, came to the ER following near syncopal episode. Patient was noted to be ashen and hypotensive with blood pressure of 76/61 and heart rate of 155 by his wife on the monitor. Patient states he felt dizzy lightheaded and felt he was going to pass out the symptoms resolved after a few minutes after laying in the bed. Patient states he gets tired while working in the yard lawnmowing and occasional has episodes of diaphoresis. Patient denies any chest pain denies any palpitations denies any syncopal episode. Denies such episodes in the past. Patient had nuclear stress tests approximately 3 years ago her which showed no evidence of ischemia but progressive worsening her EF last EF was 38%.  Past Medical History:  Diagnosis Date  . Coronary artery disease   . Diabetes mellitus   . Diverticulitis   . Hypertension     Past Surgical History:  Procedure Laterality Date  . ANGIOPLASTY    . CORONARY STENTS    . LEFT HEART CATHETERIZATION WITH CORONARY ANGIOGRAM N/A 03/16/2012   Procedure: LEFT HEART CATHETERIZATION WITH CORONARY ANGIOGRAM;  Surgeon: Clent Demark, MD;  Location: [facility] CATH LAB;  Service: Cardiovascular;  Laterality: N/A;  . PERCUTANEOUS CORONARY STENT INTERVENTION (PCI-S)  03/16/2012   Procedure: PERCUTANEOUS CORONARY STENT INTERVENTION (PCI-S);  Surgeon: Clent Demark, MD;  Location: [facility] CATH LAB;  Service: Cardiovascular;;  . TONSILLECTOMY      No family history on  file. Social History:  reports that he has never smoked. He has never used smokeless tobacco. He reports that he does not drink alcohol or use drugs.  Allergies: No Known Allergies   (Not in a hospital admission)  Results for orders placed or performed during the hospital encounter of 02/01/17 (from the past 48 hour(s))  Basic metabolic panel     Status: Abnormal   Collection Time: 02/01/17  6:14 AM  Result Value Ref Range   Sodium 138 135 - 145 mmol/L   Potassium 3.4 (L) 3.5 - 5.1 mmol/L   Chloride 102 101 - 111 mmol/L   CO2 28 22 - 32 mmol/L   Glucose, Bld 175 (H) 65 - 99 mg/dL   BUN 12 6 - 20 mg/dL   Creatinine, Ser 0.95 0.61 - 1.24 mg/dL   Calcium 8.8 (L) 8.9 - 10.3 mg/dL   GFR calc non Af Amer >60 >60 mL/min   GFR calc Af Amer >60 >60 mL/min    Comment: (NOTE) The eGFR has been calculated using the CKD EPI equation. This calculation has not been validated in all clinical situations. eGFR's persistently <60 mL/min signify possible Chronic Kidney Disease.    Anion gap 8 5 - 15  CBC     Status: None   Collection Time: 02/01/17  6:14 AM  Result Value Ref Range   WBC 6.6 4.0 - 10.5 K/uL   RBC 4.72 4.22 - 5.81 MIL/uL   Hemoglobin 14.2 13.0 - 17.0 g/dL   HCT 40.2 39.0 - 52.0 %   MCV 85.2 78.0 - 100.0 fL   MCH 30.1 26.0 -  34.0 pg   MCHC 35.3 30.0 - 36.0 g/dL   RDW 12.2 11.5 - 15.5 %   Platelets 216 150 - 400 K/uL  Urinalysis, Routine w reflex microscopic     Status: Abnormal   Collection Time: 02/01/17  6:14 AM  Result Value Ref Range   Color, Urine YELLOW YELLOW   APPearance HAZY (A) CLEAR   Specific Gravity, Urine 1.014 1.005 - 1.030   pH 7.0 5.0 - 8.0   Glucose, UA 50 (A) NEGATIVE mg/dL   Hgb urine dipstick NEGATIVE NEGATIVE   Bilirubin Urine NEGATIVE NEGATIVE   Ketones, ur NEGATIVE NEGATIVE mg/dL   Protein, ur 30 (A) NEGATIVE mg/dL   Nitrite NEGATIVE NEGATIVE   Leukocytes, UA NEGATIVE NEGATIVE   RBC / HPF 0-5 0 - 5 RBC/hpf   WBC, UA 0-5 0 - 5 WBC/hpf    Bacteria, UA NONE SEEN NONE SEEN   Squamous Epithelial / LPF NONE SEEN NONE SEEN   Mucous PRESENT    Hyaline Casts, UA PRESENT    Amorphous Crystal PRESENT   CBG monitoring, ED     Status: Abnormal   Collection Time: 02/01/17  6:14 AM  Result Value Ref Range   Glucose-Capillary 158 (H) 65 - 99 mg/dL  I-Stat Troponin, ED (not at [facility])     Status: None   Collection Time: 02/01/17  6:18 AM  Result Value Ref Range   Troponin i, poc 0.02 0.00 - 0.08 ng/mL   Comment 3            Comment: Due to the release kinetics of cTnI, a negative result within the first hours of the onset of symptoms does not rule out myocardial infarction with certainty. If myocardial infarction is still suspected, repeat the test at appropriate intervals.    Dg Chest 2 View  Result Date: 02/01/2017 CLINICAL DATA:  Dizziness.  Near syncope. EXAM: CHEST  2 VIEW COMPARISON:  Chest x-ray 10/11/2012 . FINDINGS: Mediastinum hilar structures normal. Heart size normal. No focal infiltrate. Mild basilar subsegmental atelectasis. No pleural effusion or pneumothorax. Interposition of the colon under the hemidiaphragms again noted. Degenerative changes thoracic spine. IMPRESSION: Mild basilar subsegmental atelectasis, exam otherwise unremarkable. Electronically Signed   By: Marcello Moores  Register   On: 02/01/2017 07:53    Review of Systems  Constitutional: Positive for diaphoresis. Negative for chills and fever.  Eyes: Negative for blurred vision.  Cardiovascular: Negative for chest pain and palpitations.  Gastrointestinal: Negative for abdominal pain, nausea and vomiting.  Genitourinary: Negative for dysuria.  Neurological: Positive for dizziness.    Blood pressure (!) 144/77, pulse 89, temperature 98.9 F (37.2 C), temperature source Oral, resp. rate 18, height '5\' 7"'$  (1.702 m), weight 77.1 kg (170 lb), SpO2 97 %. Physical Exam  Constitutional: He is oriented to person, place, and time.  HENT:  Head: Normocephalic and  atraumatic.  Eyes: Pupils are equal, round, and reactive to light. Conjunctivae are normal. Left eye exhibits no discharge. No scleral icterus.  Neck: Normal range of motion. Neck supple. No JVD present. No tracheal deviation present. No thyromegaly present.  Cardiovascular: Normal rate and regular rhythm.   Murmur (Soft systolic murmur and S4 gallop noted) heard. Respiratory: Effort normal and breath sounds normal. No respiratory distress. He has no wheezes. He has no rales.  GI: Soft. Bowel sounds are normal. He exhibits no distension. There is no tenderness.  Musculoskeletal: He exhibits no edema, tenderness or deformity.  Neurological: He is alert and oriented to person, place, and  time.     Assessment/Plan Status post near-syncope with hypotension/tachycardia rule out VT rule out ischemia Multivessel coronary artery disease history of silent inferior wall MI in the past status post PCI to LAD left circumflex and RCA in the past Ischemic cardiomyopathy Hypertension Diabetes mellitus Hyperlipidemia Remote tobacco abuse Erectile dysfunction Plan As per orders Discussed with patient and his wife at length regarding left cardiac catheterization possible PTCA stenting its risk and benefits i.e. death MI stroke need for emergency CABG local vascular complications, risk of restenosis radial versus femoral approach its risk and benefits and consents for PCI. We'll get EP consult if ischemic workup is negative   Charolette Forward, MD 02/01/2017, 8:14 AM

## 2017-02-01 NOTE — ED Notes (Signed)
Family and pt updated on delay.

## 2017-02-01 NOTE — ED Notes (Signed)
Meal tray ordered 

## 2017-02-01 NOTE — ED Notes (Signed)
Seizure? -a11

## 2017-02-01 NOTE — ED Notes (Signed)
Pt eating lunch.  Family at the bedside.

## 2017-02-01 NOTE — ED Notes (Signed)
RN called bed placement. Room is being cleaned.

## 2017-02-01 NOTE — ED Provider Notes (Signed)
[facility]-EMERGENCY DEPT Provider Note   CSN: [id number] Arrival date & time: 02/01/17  0602     History   Chief Complaint Chief Complaint  Patient presents with  . Near Syncope    HPI Alexander Donaldson is a 83 y.o. male.  The history is provided by the patient.  Near Syncope  This is a new problem. The current episode started 1 to 2 hours ago. The problem occurs rarely. The problem has been resolved. Pertinent negatives include no chest pain, no abdominal pain and no shortness of breath. Nothing aggravates the symptoms. The symptoms are relieved by position. He has tried nothing for the symptoms.   83 year old male who presents with near syncope. He has a history of coronary artery disease status post multiple stent placements, hypertension, and diabetes. He reports that he has been in his usual state of health. This morning with arranging breakfast for him and his wife when he suddenly became lightheaded and dizzy. States that everything looked foggy, as if he could pass out. States that he sat down and leans his head against the table, and eventually the feeling passed. He told his wife, and she obtained his vital signs. He states that his blood pressure was low at 78/61 and his heart rate was 155.  Denies feeling any palpitations, chest pain, difficulty breathing. States that he regularly exercises on, without significant dyspnea on exertion or chest pain. Reports eating and drinking normally without dehydration. No nausea, vomiting, diarrhea, melena, hematochezia, back pain or abdominal pain.   Past Medical History:  Diagnosis Date  . Coronary artery disease   . Diabetes mellitus   . Diverticulitis   . Hypertension     There are no active problems to display for this patient.   Past Surgical History:  Procedure Laterality Date  . ANGIOPLASTY    . CORONARY STENTS    . LEFT HEART CATHETERIZATION WITH CORONARY ANGIOGRAM N/A 03/16/2012   Procedure: LEFT HEART CATHETERIZATION WITH  CORONARY ANGIOGRAM;  Surgeon: Robynn PaneMohan N Harwani, MD;  Location: [facility] CATH LAB;  Service: Cardiovascular;  Laterality: N/A;  . PERCUTANEOUS CORONARY STENT INTERVENTION (PCI-S)  03/16/2012   Procedure: PERCUTANEOUS CORONARY STENT INTERVENTION (PCI-S);  Surgeon: Robynn PaneMohan N Harwani, MD;  [facility];  Service: Cardiovascular;;  . TONSILLECTOMY         Home Medications    Prior to Admission medications   Medication Sig Start Date End Date Taking? Authorizing Provider  amLODipine (NORVASC) 5 MG tablet Take 5 mg by mouth 2 (two) times daily.     [provider]  aspirin 81 MG tablet Take 81 mg by mouth daily.      [provider]  atorvastatin (LIPITOR) 80 MG tablet Take 80 mg by mouth at bedtime.    [provider]  glipiZIDE (GLUCOTROL XL) 10 MG 24 hr tablet Take 10 mg by mouth 2 (two) times daily with a meal.     [provider]  metFORMIN (GLUCOPHAGE-XR) 500 MG 24 hr tablet Take 500 mg by mouth 2 (two) times daily with a meal.     [provider]  metoprolol tartrate (LOPRESSOR) 25 MG tablet Take 25 mg by mouth 2 (two) times daily.    [provider]  Multiple Vitamin (MULTIVITAMIN WITH MINERALS) TABS Take 1 tablet by mouth daily.    [provider]  nitroGLYCERIN (NITROSTAT) 0.4 MG SL tablet Place 1 tablet (0.4 mg total) under the tongue every 5 (five) minutes x 3 doses as  needed for chest pain. 03/18/12 03/18/13  Rinaldo Cloud, MD  psyllium (HYDROCIL/METAMUCIL) 95 % PACK Take 1 packet by mouth daily.    [provider]    Family History No family history on file.  Social History Social History  Substance Use Topics  . Smoking status: Never Smoker  . Smokeless tobacco: Never Used  . Alcohol use No     Comment: social     Allergies   Patient has no known allergies.   Review of Systems Review of Systems  Constitutional: Negative for fever.  Respiratory: Positive for cough. Negative for shortness of breath.     Cardiovascular: Positive for near-syncope. Negative for chest pain.  Gastrointestinal: Negative for abdominal pain and blood in stool.  Musculoskeletal: Negative for back pain.  Neurological: Positive for light-headedness. Negative for syncope.  Hematological: Does not bruise/bleed easily.  All other systems reviewed and are negative.    Physical Exam Updated Vital Signs BP (!) 144/77 (BP Location: Right Arm)   Pulse 89   Temp 98.9 F (37.2 C) (Oral)   Resp 18   Ht 5\' 7"  (1.702 m)   Wt 77.1 kg (170 lb)   SpO2 97%   BMI 26.63 kg/m   Physical Exam Physical Exam  Nursing note and vitals reviewed. Constitutional: Well developed, well nourished, non-toxic, and in no acute distress Head: Normocephalic and atraumatic.  Mouth/Throat: Oropharynx is clear and moist.  Neck: Normal range of motion. Neck supple.  Cardiovascular: Normal rate and regular rhythm.   Pulmonary/Chest: Effort normal and breath sounds normal.  Abdominal: Soft. There is no tenderness. There is no rebound and no guarding.  Musculoskeletal: Normal range of motion.  no lower extremity edema.  Neurological: Alert, no facial droop, fluent speech, moves all extremities symmetrically Skin: Skin is warm and dry.  Psychiatric: Cooperative   ED Treatments / Results  Labs (all labs ordered are listed, but only abnormal results are displayed) Labs Reviewed  BASIC METABOLIC PANEL - Abnormal; Notable for the following:       Result Value   Potassium 3.4 (*)    Glucose, Bld 175 (*)    Calcium 8.8 (*)    All other components within normal limits  URINALYSIS, ROUTINE W REFLEX MICROSCOPIC - Abnormal; Notable for the following:    APPearance HAZY (*)    Glucose, UA 50 (*)    Protein, ur 30 (*)    All other components within normal limits  CBG MONITORING, ED - Abnormal; Notable for the following:    Glucose-Capillary 158 (*)    All other components within normal limits  CBC  I-STAT TROPONIN, ED    EKG  EKG  Interpretation  Date/Time:  Wednesday February 01 2017 06:04:47 EDT Ventricular Rate:  89 PR Interval:  138 QRS Duration: 92 QT Interval:  358 QTC Calculation: 435 R Axis:   11 Text Interpretation:  Sinus rhythm with occasional Premature ventricular complexes Otherwise normal ECG When compared with ECG of 03/28/2014, No significant change was found Confirmed by Dione Booze ([id number]) on 02/01/2017 6:17:41 AM       Radiology No results found.  Procedures Procedures (including critical care time)  Medications Ordered in ED Medications - No data to display   Initial Impression / Assessment and Plan / ED Course  I have reviewed the triage vital signs and the nursing notes.  Pertinent labs & imaging results that were available during my care of the patient were reviewed by me and considered in my medical decision  making (see chart for details).     Records are reviewed. Patient did have nuclear medicine stress test that showed EF of 38% in 2015, in the setting of this significant cardiac disease. Dr. Sharyn LullHarwani is his cardiologist.  His presentation today is concerning for underlying arrhythmia as during episode of near-syncope heart rate in the 150s with hypotension. He is well-appearing and asymptomatic here in the emergency department. His EKG shows occasional PVCs, but no evidence of acute ischemia. Initial troponin is negative.   Discussed with Dr. Sharyn LullHarwani regarding patient presentation and concern for arrhythmia. He will admit patient for monitoring.   Final Clinical Impressions(s) / ED Diagnoses   Final diagnoses:  Near syncope    New Prescriptions New Prescriptions   No medications on file     Lavera GuiseLiu, Yarixa Lightcap Duo, MD 02/01/17 [phone]

## 2017-02-01 NOTE — Progress Notes (Signed)
ANTICOAGULATION CONSULT NOTE - Initial Consult  Pharmacy Consult for heparin  Indication: chest pain/ACS  No Known Allergies  Patient Measurements: Height: 5\' 7"  (170.2 cm) Weight: 170 lb (77.1 kg) IBW/kg (Calculated) : 66.1   Vital Signs: Temp: 98.2 F (36.8 C) (07/25 1315) Temp Source: Oral (07/25 1315) BP: 155/77 (07/25 1315) Pulse Rate: 97 (07/25 1315)  Labs:  Recent Labs  02/01/17 0614  HGB 14.2  HCT 40.2  PLT 216  CREATININE 0.95    Estimated Creatinine Clearance: 57 mL/min (by C-G formula based on SCr of 0.95 mg/dL).   Medical History: Past Medical History:  Diagnosis Date  . Coronary artery disease   . Diabetes mellitus   . Diverticulitis   . Hypertension     Medications:  Prescriptions Prior to Admission  Medication Sig Dispense Refill Last Dose  . amLODipine (NORVASC) 5 MG tablet Take 5 mg by mouth 2 (two) times daily.    01/31/2017 at Unknown time  . aspirin 81 MG tablet Take 81 mg by mouth daily.     01/31/2017 at Unknown time  . atorvastatin (LIPITOR) 80 MG tablet Take 80 mg by mouth at bedtime.   01/31/2017 at Unknown time  . clopidogrel (PLAVIX) 75 MG tablet Take 75 mg by mouth daily.  3 01/31/2017 at 0930  . dextromethorphan (DELSYM) 30 MG/5ML liquid Take 60 mg by mouth 2 (two) times daily as needed for cough.   01/31/2017 at Unknown time  . glipiZIDE (GLUCOTROL) 10 MG tablet Take 10 mg by mouth 2 (two) times daily before a meal. Take 30 minutes before meals   01/31/2017 at Unknown time  . losartan (COZAAR) 100 MG tablet Take 100 mg by mouth daily.   01/31/2017 at Unknown time  . metoprolol succinate (TOPROL-XL) 25 MG 24 hr tablet Take 25 mg by mouth daily.  3 01/31/2017 at 0930  . Omega-3 Fatty Acids (FISH OIL) 1000 MG CAPS Take 1,000 mg by mouth 2 (two) times daily.   01/31/2017 at Unknown time  . psyllium (HYDROCIL/METAMUCIL) 95 % PACK Take 1 packet by mouth 2 (two) times daily.    01/31/2017 at Unknown time  . nitroGLYCERIN (NITROSTAT) 0.4 MG SL  tablet Place 1 tablet (0.4 mg total) under the tongue every 5 (five) minutes x 3 doses as needed for chest pain. 25 tablet 3 prn   Scheduled:  . aspirin  324 mg Oral NOW   Or  . aspirin  300 mg Rectal NOW  . [START ON 02/02/2017] aspirin EC  81 mg Oral Daily  . atorvastatin  80 mg Oral QHS  . [START ON 02/02/2017] clopidogrel  75 mg Oral Q breakfast  . insulin aspart  0-9 Units Subcutaneous TID WC  . losartan  25 mg Oral Daily  . metoprolol tartrate  25 mg Oral BID  . nitroGLYCERIN  0.5 inch Topical Q6H  . [START ON 02/02/2017] pantoprazole  40 mg Oral Q0600  . potassium chloride  40 mEq Oral Once  . sodium chloride flush  3 mL Intravenous Q12H    Assessment: 83 yo male s/p near syncope and hx of CAD with MI. Pharmacy consulted to dose heparin for r/o ACS. No anticoagulants noted PTA.    Goal of Therapy:  Heparin level 0.3-0.7 units/ml Monitor platelets by anticoagulation protocol: Yes   Plan:  -Heparin bolus 4000 units IV followed by 900 units/hr (~ 12 units/kg/hr) -Heparin level in 8 hours and daily wth CBC daily  Harland Germanndrew Carlson Belland, Pharm D 02/01/2017 3:06 PM

## 2017-02-01 NOTE — ED Triage Notes (Signed)
Pt reports waking up for work this AM and having near syncopal episode. Pt became dizzy, states he checked BP at home and it was noted to be low. Pt BP WNL in triage. Pt A/OX4, no neuro deficits.

## 2017-02-02 ENCOUNTER — Encounter (HOSPITAL_COMMUNITY)
Admission: EM | Disposition: A | Discharge status: Home / Self Care | Payer: Self-pay | Source: Home / Self Care | Attending: Cardiology

## 2017-02-02 ENCOUNTER — Encounter (HOSPITAL_COMMUNITY): Payer: Self-pay | Admitting: Cardiology

## 2017-02-02 HISTORY — PX: CORONARY STENT INTERVENTION: CATH118234

## 2017-02-02 HISTORY — PX: LEFT HEART CATH AND CORONARY ANGIOGRAPHY: CATH118249

## 2017-02-02 LAB — GLUCOSE, CAPILLARY
GLUCOSE-CAPILLARY: 164 mg/dL — AB (ref 65–99)
GLUCOSE-CAPILLARY: 184 mg/dL — AB (ref 65–99)
Glucose-Capillary: 149 mg/dL — ABNORMAL HIGH (ref 65–99)
Glucose-Capillary: 137 mg/dL — ABNORMAL HIGH (ref 65–99)

## 2017-02-02 LAB — CBC
HEMATOCRIT: 39.3 % (ref 39.0–52.0)
HEMOGLOBIN: 13.5 g/dL (ref 13.0–17.0)
MCH: 29.3 pg (ref 26.0–34.0)
MCHC: 34.4 g/dL (ref 30.0–36.0)
MCV: 85.4 fL (ref 78.0–100.0)
Platelets: 197 10*3/uL (ref 150–400)
RBC: 4.6 MIL/uL (ref 4.22–5.81)
RDW: 12.2 % (ref 11.5–15.5)
WBC: 7.4 10*3/uL (ref 4.0–10.5)

## 2017-02-02 LAB — BASIC METABOLIC PANEL
ANION GAP: 7 (ref 5–15)
BUN: 14 mg/dL (ref 6–20)
CALCIUM: 8.5 mg/dL — AB (ref 8.9–10.3)
CO2: 26 mmol/L (ref 22–32)
Chloride: 103 mmol/L (ref 101–111)
Creatinine, Ser: 0.87 mg/dL (ref 0.61–1.24)
GFR calc non Af Amer: >60 mL/min (ref >60)
GLUCOSE: 152 mg/dL — AB (ref 65–99)
POTASSIUM: 3.7 mmol/L (ref 3.5–5.1)
Sodium: 136 mmol/L (ref 135–145)

## 2017-02-02 LAB — LIPID PANEL
CHOL/HDL RATIO: 3.3 ratio
CHOLESTEROL: 135 mg/dL (ref 0–200)
HDL: 41 mg/dL (ref >40)
LDL CALC: 81 mg/dL (ref 0–99)
TRIGLYCERIDES: 66 mg/dL (ref <150)
VLDL: 13 mg/dL (ref 0–40)

## 2017-02-02 LAB — HEMOGLOBIN A1C
Hgb A1c MFr Bld: 8.4 % — ABNORMAL HIGH (ref 4.8–5.6)
MEAN PLASMA GLUCOSE: 194 mg/dL

## 2017-02-02 LAB — HEPARIN LEVEL (UNFRACTIONATED)
HEPARIN UNFRACTIONATED: 0.32 [IU]/mL (ref 0.30–0.70)
Heparin Unfractionated: 0.31 IU/mL (ref 0.30–0.70)

## 2017-02-02 LAB — POCT ACTIVATED CLOTTING TIME: Activated Clotting Time: 389 seconds

## 2017-02-02 LAB — TROPONIN I: Troponin I: <0.03 ng/mL (ref <0.03)

## 2017-02-02 SURGERY — LEFT HEART CATH AND CORONARY ANGIOGRAPHY
Anesthesia: LOCAL

## 2017-02-02 MED ORDER — HEPARIN (PORCINE) IN NACL 2-0.9 UNIT/ML-% IJ SOLN
INTRAMUSCULAR | Status: AC | PRN
  Administered 2017-02-02: 1000 mL

## 2017-02-02 MED ORDER — SODIUM CHLORIDE 0.9 % IV SOLN: 250.0000 mL | INTRAVENOUS | Status: DC | PRN

## 2017-02-02 MED ORDER — FENTANYL CITRATE (PF) 100 MCG/2ML IJ SOLN
INTRAMUSCULAR | Status: AC
  Filled 2017-02-02: qty 2

## 2017-02-02 MED ORDER — BIVALIRUDIN BOLUS VIA INFUSION - CUPID
INTRAVENOUS | Status: DC | PRN
  Administered 2017-02-02: 57.825 mg via INTRAVENOUS

## 2017-02-02 MED ORDER — MIDAZOLAM HCL 2 MG/2ML IJ SOLN
INTRAMUSCULAR | Status: DC | PRN
  Administered 2017-02-02: 1 mg via INTRAVENOUS

## 2017-02-02 MED ORDER — NITROGLYCERIN IN D5W 200-5 MCG/ML-% IV SOLN
INTRAVENOUS | Status: AC
  Filled 2017-02-02: qty 250

## 2017-02-02 MED ORDER — NITROGLYCERIN IN D5W 200-5 MCG/ML-% IV SOLN
INTRAVENOUS | Status: AC | PRN
  Administered 2017-02-02: 5 ug/min via INTRAVENOUS

## 2017-02-02 MED ORDER — MAGNESIUM HYDROXIDE 400 MG/5ML PO SUSP: 30.0000 mL | Freq: Every day | ORAL | Status: DC | PRN

## 2017-02-02 MED ORDER — IOPAMIDOL (ISOVUE-370) INJECTION 76%
INTRAVENOUS | Status: AC
  Filled 2017-02-02: qty 100

## 2017-02-02 MED ORDER — NITROGLYCERIN 1 MG/10 ML FOR IR/CATH LAB
INTRA_ARTERIAL | Status: AC
  Filled 2017-02-02: qty 10

## 2017-02-02 MED ORDER — HEPARIN (PORCINE) IN NACL 2-0.9 UNIT/ML-% IJ SOLN
INTRAMUSCULAR | Status: AC
  Filled 2017-02-02: qty 1000

## 2017-02-02 MED ORDER — IOPAMIDOL (ISOVUE-370) INJECTION 76%
INTRAVENOUS | Status: AC
  Filled 2017-02-02: qty 50

## 2017-02-02 MED ORDER — SODIUM CHLORIDE 0.9 % IV SOLN
INTRAVENOUS | Status: AC
  Administered 2017-02-02: 12:00:00 via INTRAVENOUS

## 2017-02-02 MED ORDER — BIVALIRUDIN TRIFLUOROACETATE 250 MG IV SOLR
INTRAVENOUS | Status: AC
  Filled 2017-02-02: qty 250

## 2017-02-02 MED ORDER — FAMOTIDINE IN NACL 20-0.9 MG/50ML-% IV SOLN
INTRAVENOUS | Status: AC
  Filled 2017-02-02: qty 50

## 2017-02-02 MED ORDER — CLOPIDOGREL BISULFATE 300 MG PO TABS
ORAL_TABLET | ORAL | Status: AC
  Filled 2017-02-02: qty 1

## 2017-02-02 MED ORDER — SODIUM CHLORIDE 0.9% FLUSH: 3.0000 mL | INTRAVENOUS | Status: DC | PRN

## 2017-02-02 MED ORDER — SODIUM CHLORIDE 0.9% FLUSH
3.0000 mL | Freq: Two times a day (BID) | INTRAVENOUS | Status: DC
  Administered 2017-02-02 – 2017-02-03 (×2): 3 mL via INTRAVENOUS

## 2017-02-02 MED ORDER — IOPAMIDOL (ISOVUE-370) INJECTION 76%
INTRAVENOUS | Status: DC | PRN
  Administered 2017-02-02: 190 mL via INTRAVENOUS

## 2017-02-02 MED ORDER — MIDAZOLAM HCL 2 MG/2ML IJ SOLN
INTRAMUSCULAR | Status: AC
Start: 2017-02-02 — End: 2017-02-02
  Filled 2017-02-02: qty 2

## 2017-02-02 MED ORDER — LIDOCAINE HCL (PF) 1 % IJ SOLN
INTRAMUSCULAR | Status: DC | PRN
  Administered 2017-02-02: 15 mL

## 2017-02-02 MED ORDER — LIDOCAINE HCL (PF) 1 % IJ SOLN
INTRAMUSCULAR | Status: AC
  Filled 2017-02-02: qty 30

## 2017-02-02 MED ORDER — FENTANYL CITRATE (PF) 100 MCG/2ML IJ SOLN
INTRAMUSCULAR | Status: DC | PRN
  Administered 2017-02-02: 25 ug via INTRAVENOUS

## 2017-02-02 MED ORDER — NITROGLYCERIN 1 MG/10 ML FOR IR/CATH LAB
INTRA_ARTERIAL | Status: DC | PRN
  Administered 2017-02-02 (×2): 150 ug via INTRACORONARY

## 2017-02-02 MED ORDER — CLOPIDOGREL BISULFATE 300 MG PO TABS
ORAL_TABLET | ORAL | Status: DC | PRN
  Administered 2017-02-02: 600 mg via ORAL

## 2017-02-02 MED ORDER — SODIUM CHLORIDE 0.9 % IV SOLN
INTRAVENOUS | Status: DC | PRN
  Administered 2017-02-02: 1.75 mg/kg/h via INTRAVENOUS

## 2017-02-02 MED ORDER — FAMOTIDINE IN NACL 20-0.9 MG/50ML-% IV SOLN
INTRAVENOUS | Status: DC | PRN
  Administered 2017-02-02: 20 mg via INTRAVENOUS

## 2017-02-02 MED ORDER — AMIODARONE HCL 200 MG PO TABS
200.0000 mg | ORAL_TABLET | Freq: Every day | ORAL | Status: DC
  Administered 2017-02-03: 200 mg via ORAL
  Filled 2017-02-02: qty 1

## 2017-02-02 SURGICAL SUPPLY — 22 items
BALLN EMERGE MR 2.0X15 (BALLOONS) ×2
BALLN ~~LOC~~ EMERGE MR 3.25X15 (BALLOONS) ×2
BALLN ~~LOC~~ EMERGE MR 3.25X20 (BALLOONS) ×2
BALLN ~~LOC~~ EMERGE MR 3.75X20 (BALLOONS) ×2
BALLOON EMERGE MR 2.0X15 (BALLOONS) ×1 IMPLANT
BALLOON ~~LOC~~ EMERGE MR 3.25X15 (BALLOONS) ×1 IMPLANT
BALLOON ~~LOC~~ EMERGE MR 3.25X20 (BALLOONS) ×1 IMPLANT
BALLOON ~~LOC~~ EMERGE MR 3.75X20 (BALLOONS) ×1 IMPLANT
CATH INFINITI 5FR MULTPACK ANG (CATHETERS) ×2 IMPLANT
CATH LAUNCHER 6FR IMA (CATHETERS) ×2 IMPLANT
CATH VISTA GUIDE 6FR XBRCA (CATHETERS) ×2 IMPLANT
KIT ENCORE 26 ADVANTAGE (KITS) ×2 IMPLANT
KIT HEART LEFT (KITS) ×2 IMPLANT
PACK CARDIAC CATHETERIZATION (CUSTOM PROCEDURE TRAY) ×2 IMPLANT
SHEATH PINNACLE 5F 10CM (SHEATH) ×2 IMPLANT
SHEATH PINNACLE 6F 10CM (SHEATH) ×2 IMPLANT
STENT XIENCE ALPINE RX 3.5X28 (Permanent Stent) ×2 IMPLANT
SYR MEDRAD MARK V 150ML (SYRINGE) ×2 IMPLANT
TRANSDUCER W/STOPCOCK (MISCELLANEOUS) ×2 IMPLANT
TUBING CIL FLEX 10 FLL-RA (TUBING) ×2 IMPLANT
WIRE EMERALD 3MM-J .035X150CM (WIRE) ×2 IMPLANT
WIRE HI TORQ BMW 190CM (WIRE) ×2 IMPLANT

## 2017-02-02 NOTE — Progress Notes (Signed)
Subjective:  Denies any chest pain or shortness of breath.  Denies palpitations.  States overall feels better after PCI.  Objective:  Vital Signs in the last 24 hours: Temp:  [97.9 F (36.6 C)-98.6 F (37 C)] 98.1 F (36.7 C) (07/26 1538) Pulse Rate:  [0-86] 74 (07/26 1550) Resp:  [0-21] 19 (07/26 1550) BP: (116-175)/(52-89) 149/72 (07/26 1540) SpO2:  [0 %-100 %] 95 % (07/26 1550) Arterial Line BP: (142-188)/(59-75) 188/75 (07/26 1550)  Intake/Output from previous day: 07/25 0701 - 07/26 0700 In: 766.7 [P.O.:120; I.V.:646.7] Out: 1300 [Urine:1300] Intake/Output from this shift: Total I/O In: 562.4 [I.V.:562.4] Out: 300 [Urine:300]  Physical Exam: Neck: no adenopathy, no carotid bruit, no JVD and supple, symmetrical, trachea midline Lungs: clear to auscultation bilaterally Heart: regular rate and rhythm, S1, S2 normal and soft systolic murmur noted Abdomen: regular rate and rhythm, S1, S2 normal and soft, nontender Extremities: extremities normal, atraumatic, no cyanosis or edema Right groin stable Lab Results:  Recent Labs  02/01/17 0614 02/02/17 0115  WBC 6.6 7.4  HGB 14.2 13.5  PLT 216 197    Recent Labs  02/01/17 1415 02/02/17 0115  NA 138 136  K 3.8 3.7  CL 100* 103  CO2 30 26  GLUCOSE 227* 152*  BUN 13 14  CREATININE 1.03 0.87    Recent Labs  02/01/17 1906 02/02/17 0115  TROPONINI <0.03 <0.03   Hepatic Function Panel  Recent Labs  02/01/17 1415  PROT 6.6  ALBUMIN 3.4*  AST 25  ALT 36  ALKPHOS 86  BILITOT 1.0    Recent Labs  02/02/17 0115  CHOL 135   No results for input(s): PROTIME in the last 72 hours.  Imaging: Imaging results have been reviewed and Dg Chest 2 View  Result Date: 02/01/2017 CLINICAL DATA:  Dizziness.  Near syncope. EXAM: CHEST  2 VIEW COMPARISON:  Chest x-ray 10/11/2012 . FINDINGS: Mediastinum hilar structures normal. Heart size normal. No focal infiltrate. Mild basilar subsegmental atelectasis. No pleural  effusion or pneumothorax. Interposition of the colon under the hemidiaphragms again noted. Degenerative changes thoracic spine. IMPRESSION: Mild basilar subsegmental atelectasis, exam otherwise unremarkable. Electronically Signed   By: Maisie Fushomas  Register   On: 02/01/2017 07:53    Cardiac Studies:  Assessment/Plan:  New-onset angina with atypical presentation.status post left cardiac catheterization/PTCA stenting to the RCA Status post near syncope, rule out cardiac arrhythmias Multivessel coronary artery disease history of silent inferior wall MI in the past status post PCI to LAD left circumflex and RCA in the past Ischemic cardiomyopathy Hypertension Diabetes mellitus Hyperlipidemia Remote tobacco abuse Erectile dysfunction Plan Continue present management. Check labs in a.m. Start low-dose amiodarone as per orders  LOS: 1 day    Rinaldo CloudHarwani, Brettney Ficken 02/02/2017, 5:32 PM

## 2017-02-02 NOTE — Progress Notes (Signed)
ANTICOAGULATION CONSULT NOTE - Follow Up Consult  Pharmacy Consult for heparin Indication: chest pain/ACS  Labs:  Recent Labs  02/01/17 0614 02/01/17 1415 02/01/17 1906 02/02/17 0115  HGB 14.2  --   --  13.5  HCT 40.2  --   --  39.3  PLT 216  --   --  197  LABPROT  --  13.5  --   --   INR  --  1.03  --   --   HEPARINUNFRC  --   --   --  0.31  CREATININE 0.95 1.03  --   --   TROPONINI  --  0.03* <0.03  --     Assessment/Plan: 83yo male therapeutic on heparin with initial dosing for CP. Will continue gtt at current rate and confirm stable with additional level.   Vernard GamblesVeronda Morganna Styles, PharmD, BCPS  02/02/2017,1:52 AM

## 2017-02-02 NOTE — Interval H&P Note (Signed)
Cath Lab Visit (complete for each Cath Lab visit)  Clinical Evaluation Leading to the Procedure:   ACS: No.  Non-ACS:    Anginal Classification: CCS III  Anti-ischemic medical therapy: Maximal Therapy (2 or more classes of medications)  Non-Invasive Test Results: Intermediate-risk stress test findings: cardiac mortality 1-3%/year  Prior CABG: No previous CABG      History and Physical Interval Note:  02/02/2017 9:25 AM  Alexander Donaldson  has presented today for surgery, with the diagnosis of cm - syncope  The various methods of treatment have been discussed with the patient and family. After consideration of risks, benefits and other options for treatment, the patient has consented to  Procedure(s): Left Heart Cath and Coronary Angiography (N/A) as a surgical intervention .  The patient's history has been reviewed, patient examined, no change in status, stable for surgery.  I have reviewed the patient's chart and labs.  Questions were answered to the patient's satisfaction.     Rinaldo CloudHarwani, Diamond Martucci

## 2017-02-03 LAB — BASIC METABOLIC PANEL
ANION GAP: 9 (ref 5–15)
BUN: 10 mg/dL (ref 6–20)
CALCIUM: 8.6 mg/dL — AB (ref 8.9–10.3)
CO2: 25 mmol/L (ref 22–32)
Chloride: 102 mmol/L (ref 101–111)
Creatinine, Ser: 0.91 mg/dL (ref 0.61–1.24)
GLUCOSE: 168 mg/dL — AB (ref 65–99)
POTASSIUM: 3.9 mmol/L (ref 3.5–5.1)
Sodium: 136 mmol/L (ref 135–145)

## 2017-02-03 LAB — CBC
HCT: 39.4 % (ref 39.0–52.0)
Hemoglobin: 14.2 g/dL (ref 13.0–17.0)
MCH: 30.5 pg (ref 26.0–34.0)
MCHC: 36 g/dL (ref 30.0–36.0)
MCV: 84.7 fL (ref 78.0–100.0)
PLATELETS: 215 10*3/uL (ref 150–400)
RBC: 4.65 MIL/uL (ref 4.22–5.81)
RDW: 12.1 % (ref 11.5–15.5)
WBC: 6.9 10*3/uL (ref 4.0–10.5)

## 2017-02-03 LAB — POCT ACTIVATED CLOTTING TIME: Activated Clotting Time: 186 seconds

## 2017-02-03 LAB — GLUCOSE, CAPILLARY: GLUCOSE-CAPILLARY: 151 mg/dL — AB (ref 65–99)

## 2017-02-03 MED ORDER — LOSARTAN POTASSIUM 50 MG PO TABS: 50.0000 mg | ORAL_TABLET | Freq: Every day | ORAL | 3 refills | Status: DC

## 2017-02-03 MED ORDER — AMLODIPINE BESYLATE 5 MG PO TABS: 5.0000 mg | ORAL_TABLET | Freq: Every day | ORAL | 3 refills | Status: DC

## 2017-02-03 MED ORDER — AMIODARONE HCL 200 MG PO TABS: 200.0000 mg | ORAL_TABLET | Freq: Every day | ORAL | 3 refills | Status: DC

## 2017-02-03 MED FILL — NITROGLYCERIN 0.4 MG TAB SL: 0.4 | 20 days supply | Qty: 25 | Fill #1

## 2017-02-03 MED FILL — LOSARTAN POTASSIUM 50 MG TA: 50 | 30 days supply | Qty: 30 | Fill #0

## 2017-02-03 MED FILL — glipiZIDE XL 5 MG TB24: 5 | 90 days supply | Qty: 360 | Fill #1

## 2017-02-03 MED FILL — AMIODARONE HCL 200 MG TAB: 200 | 30 days supply | Qty: 30 | Fill #0

## 2017-02-03 MED FILL — CLOPIDOGREL 75 MG TABLET: 75 | 30 days supply | Qty: 30 | Fill #2

## 2017-02-03 NOTE — Progress Notes (Signed)
CARDIAC REHAB PHASE I   PRE:  Rate/Rhythm: 87 SR  BP:  Supine: 158/74  Sitting:   Standing:    SaO2: 96%RA  MODE:  Ambulation: 340 ft   POST:  Rate/Rhythm: 96SR.  BP:  Supine:   Sitting: 152/72  Standing:    SaO2: 98%RA 1010-1105 Pt walked 340 ft with hand held asst with steady gait. No CP. Tolerated well. No dizziness. Education completed with pt and wife who voiced understanding. Reviewed importance of plavix, how to take NTG if needed, counting carbs and watching sodium and heart healthy food choices, and ex ed. Pt has attended CRP 2 before. Will refer again to [place].   Alexander Nuttingharlene Leonel Mccollum, RN BSN  02/03/2017 11:02 AM

## 2017-02-03 NOTE — Discharge Instructions (Signed)
Coronary Angiogram With Stent, Care After °This sheet gives you information about how to care for yourself after your procedure. Your health care provider may also give you more specific instructions. If you have problems or questions, contact your health care provider. °What can I expect after the procedure? °After your procedure, it is common to have: °· Bruising in the area where a small, thin tube (catheter) was inserted. This usually fades within 1-2 weeks. °· Blood collecting in the tissue (hematoma) that may be painful to the touch. It should usually decrease in size and tenderness within 1-2 weeks. ° °Follow these instructions at home: °Insertion area care °· Do not take baths, swim, or use a hot tub until your health care provider approves. °· You may shower 24-48 hours after the procedure or as directed by your health care provider. °· Follow instructions from your health care provider about how to take care of your incision. Make sure you: °? Wash your hands with soap and water before you change your bandage (dressing). If soap and water are not available, use hand sanitizer. °? Change your dressing as told by your health care provider. °? Leave stitches (sutures), skin glue, or adhesive strips in place. These skin closures may need to stay in place for 2 weeks or longer. If adhesive strip edges start to loosen and curl up, you may trim the loose edges. Do not remove adhesive strips completely unless your health care provider tells you to do that. °· Remove the bandage (dressing) and gently wash the catheter insertion site with plain soap and water. °· Pat the area dry with a clean towel. Do not rub the area, because that may cause bleeding. °· Do not apply powder or lotion to the incision area. °· Check your incision area every day for signs of infection. Check for: °? More redness, swelling, or pain. °? More fluid or blood. °? Warmth. °? Pus or a bad smell. °Activity °· Do not drive for 24 hours if you  were given a medicine to help you relax (sedative). °· Do not lift anything that is heavier than 10 lb (4.5 kg) for 5 days after your procedure or as directed by your health care provider. °· Ask your health care provider when it is okay for you: °? To return to work or school. °? To resume usual physical activities or sports. °? To resume sexual activity. °Eating and drinking °· Eat a heart-healthy diet. This should include plenty of fresh fruits and vegetables. °· Avoid the following types of food: °? Food that is high in salt. °? Canned or highly processed food. °? Food that is high in saturated fat or sugar. °? Fried food. °· Limit alcohol intake to no more than 1 drink a day for non-pregnant women and 2 drinks a day for men. One drink equals 12 oz of beer, 5 oz of wine, or 1½ oz of hard liquor. °Lifestyle °· Do not use any products that contain nicotine or tobacco, such as cigarettes and e-cigarettes. If you need help quitting, ask your health care provider. °· Take steps to manage and control your weight. °· Get regular exercise. °· Manage your blood pressure. °· Manage other health problems, such as diabetes. °General instructions °· Take over-the-counter and prescription medicines only as told by your health care provider. Blood thinners may be prescribed after your procedure to improve blood flow through the stent. °· If you need an MRI after your heart stent has been placed, be   sure to tell the health care provider who orders the MRI that you have a heart stent.  Keep all follow-up visits as directed by your health care provider. This is important. Contact a health care provider if:  You have a fever.  You have chills.  You have increased bleeding from the catheter insertion area. Hold pressure on the area. Get help right away if:  You develop chest pain or shortness of breath.  You feel faint or you pass out.  You have unusual pain at the catheter insertion area.  You have redness,  warmth, or swelling at the catheter insertion area.  You have drainage (other than a small amount of blood on the dressing) from the catheter insertion area.  The catheter insertion area is bleeding, and the bleeding does not stop after 30 minutes of holding steady pressure on the area.  You develop bleeding from any other place, such as from your rectum. There may be bright red blood in your urine or stool, or it may appear as black, tarry stool. This information is not intended to replace advice given to you by your health care provider. Make sure you discuss any questions you have with your health care provider. Document Released: 01/14/2005 Document Revised: 03/24/2016 Document Reviewed: 03/24/2016 Elsevier Interactive Patient Education  2018 Elsevier Inc.  Angina Pectoris Angina pectoris, often called angina, is extreme discomfort in the chest, neck, or arm. This is caused by a lack of blood in the middle and thickest layer of the heart wall (myocardium). There are four types of angina:  Stable angina. Stable angina usually occurs in episodes of predictable frequency and duration. It is usually brought on by physical activity, stress, or excitement. Stable angina usually lasts a few minutes and can often be relieved by a medicine that you place under your tongue. This medicine is called sublingual nitroglycerin.  Unstable angina. Unstable angina can occur even when you are doing little or no physical activity. It can even occur while you are sleeping or when you are at rest. It can suddenly increase in severity or frequency. It may not be relieved by sublingual nitroglycerin, and it can last up to 30 minutes.  Microvascular angina. This type of angina is caused by a disorder of tiny blood vessels called arterioles. Microvascular angina is more common in women. The pain may be more severe and last longer than other types of angina pectoris.  Prinzmetal or variant angina. This type of angina  pectoris is rare and usually occurs when you are doing little or no physical activity. It especially occurs in the early morning hours.  What are the causes? Atherosclerosis is the cause of angina. This is the buildup of fat and cholesterol (plaque) on the inside of the arteries. Over time, the plaque may narrow or block the artery, and this will lessen blood flow to the heart. Plaque can also become weak and break off within a coronary artery to form a clot and cause a sudden blockage. What increases the risk? Risk factors common to both men and women include:  High cholesterol levels.  High blood pressure (hypertension).  Tobacco use.  Diabetes.  Family history of angina.  Obesity.  Lack of exercise.  A diet high in saturated fats.  Women are at greater risk for angina if they are:  Over age 81.  Postmenopausal.  What are the signs or symptoms? Many people do not experience any symptoms during the early stages of angina. As the condition  progresses, symptoms common to both men and women may include:  Chest pain. ? The pain can be described as a crushing or squeezing in the chest, or a tightness, pressure, fullness, or heaviness in the chest. ? The pain can last more than a few minutes, or it can stop and recur.  Pain in the arms, neck, jaw, or back.  Unexplained heartburn or indigestion.  Shortness of breath.  Nausea.  Sudden cold sweats.  Sudden light-headedness.  Many women have chest discomfort and some of the other symptoms. However, women often have different (atypical) symptoms, such as:  Fatigue.  Unexplained feelings of nervousness or anxiety.  Unexplained weakness.  Dizziness or fainting.  Sometimes, women may have angina without any symptoms. How is this diagnosed? Tests to diagnose angina may include:  ECG (electrocardiogram).  Exercise stress test. This looks for signs of blockage when the heart is being exercised.  Pharmacologic stress  test. This test looks for signs of blockage when the heart is being stressed with a medicine.  Blood tests.  Coronary angiogram. This is a procedure to look at the coronary arteries to see if there is any blockage.  How is this treated? The treatment of angina may include the following:  Healthy behavioral changes to reduce or control risk factors.  Medicine.  Coronary stenting.A stent helps to keep an artery open.  Coronary angioplasty. This procedure widens a narrowed or blocked artery.  Coronary arterybypass surgery. This will allow your blood to pass the blockage (bypass) to reach your heart.  Follow these instructions at home:  Take medicines only as directed by your health care provider.  Do not take the following medicines unless your health care provider approves: ? Nonsteroidal anti-inflammatory drugs (NSAIDs), such as ibuprofen, naproxen, or celecoxib. ? Vitamin supplements that contain vitamin A, vitamin E, or both. ? Hormone replacement therapy that contains estrogen with or without progestin.  Manage other health conditions such as hypertension and diabetes as directed by your health care provider.  Follow a heart-healthy diet. A dietitian can help to educate you about healthy food options and changes.  Use healthy cooking methods such as roasting, grilling, broiling, baking, poaching, steaming, or stir-frying. Talk to a dietitian to learn more about healthy cooking methods.  Follow an exercise program approved by your health care provider.  Maintain a healthy weight. Lose weight as approved by your health care provider.  Plan rest periods when fatigued.  Learn to manage stress.  Do not use any tobacco products, including cigarettes, chewing tobacco, or electronic cigarettes. If you need help quitting, ask your health care provider.  If you drink alcohol, and your health care provider approves, limit your alcohol intake to no more than 1 drink per day. One  drink equals 12 ounces of beer, 5 ounces of wine, or 1 ounces of hard liquor.  Stop illegal drug use.  Keep all follow-up visits as directed by your health care provider. This is important. Get help right away if:  You have pain in your chest, neck, arm, jaw, stomach, or back that lasts more than a few minutes, is recurring, or is unrelieved by taking sublingualnitroglycerin.  You have profuse sweating without cause.  You have unexplained: ? Heartburn or indigestion. ? Shortness of breath or difficulty breathing. ? Nausea or vomiting. ? Fatigue. ? Feelings of nervousness or anxiety. ? Weakness. ? Diarrhea.  You have sudden light-headedness or dizziness.  You faint. These symptoms may represent a serious problem that is an emergency.  Do not wait to see if the symptoms will go away. Get medical help right away. Call your local emergency services (911 in the [place] Do not drive yourself to the hospital. This information is not intended to replace advice given to you by your health care provider. Make sure you discuss any questions you have with your health care provider. Document Released: 06/27/2005 Document Revised: 12/09/2015 Document Reviewed: 10/29/2013 Elsevier Interactive Patient Education  2017 [employer]

## 2017-02-03 NOTE — Consult Note (Signed)
   [facility]   02/03/2017  Adela GlimpseHoward L Royle 06/07/1934 [id number]     Attempted bedside visit on behalf of [facility]/Link to Wellness program for [place] employees/dependents with [facility].  [name] was discharged prior to being able to speak with him.  Raiford NobleAtika Hall, MSN-Ed, RN,BSN [facility] Care Management Hospital Liaison [phone]

## 2017-02-03 NOTE — Discharge Summary (Signed)
Discharge summary dictated on 02/03/2017, dictation number is [id number]

## 2017-02-03 NOTE — Progress Notes (Signed)
Patient jumped out of bed per wife and got very confused and started removing all the telemetry leads. Patient stated wants to leave and not sure where he was, but was easily redirected by staff. Wife at  Bedside. Bed in low position. Will continue to monitor.

## 2017-02-03 NOTE — Discharge Summary (Signed)
NAMCarlton Adam:  Egnew, Riel               ACCOUNT NO.:  [id number]  MEDICAL RECORD NO.:  [id number]  LOCATION:                                 FACILITY:  PHYSICIAN:  Kai Railsback N. Sharyn LullHarwani, M.D.      DATE OF BIRTH:  DATE OF ADMISSION:  02/01/2017 DATE OF DISCHARGE:  02/03/2017                              DISCHARGE SUMMARY   ADMITTING DIAGNOSES: 1. Status post near syncope with hypotension/tachycardia, rule out     ventricular tachycardia, and rule out ischemia. 2. Multivessel coronary artery disease, history of silent inferior     wall myocardial infarction in the past, status post PCI to the LAD,     left circumflex, and RCA in the past. 3. Ischemic cardiomyopathy. 4. Hypertension. 5. Diabetes mellitus. 6. Hyperlipidemia. 7. Remote tobacco abuse. 8. Erectile dysfunction.  FINAL DIAGNOSES: 1. New onset angina with atypical presentation, status post left     cardiac catheterization/PTCA stenting to the mid and distal     junction of RCA. 2. Status post near syncope rule out cardiac arrhythmias. 3. Multivessel coronary artery disease, history of silent inferior     wall myocardial infarction in the past, status post PCI to the LAD,     left circumflex, and RCA in the past. 4. Ischemic cardiomyopathy. 5. Hypertension. 6. Diabetes mellitus. 7. Hyperlipidemia. 8. Remote tobacco abuse. 9. Erectile dysfunction.  DISCHARGE HOME MEDICATIONS: 1. Amiodarone 200 mg 1 tablet daily. 2. Aspirin 81 mg 1 tablet daily. 3. Atorvastatin 80 mg daily. 4. Clopidogrel 75 mg daily. 5. Dextromethorphan liquid 60 mg twice daily as needed for cough. 6. Fish oil 1000 mg twice daily. 7. Glipizide 10 mg twice daily. 8. Metoprolol succinate 25 mg daily. 9. Nitrostat sublingual p.r.n. 10.Metamucil p.r.n. 11.Amlodipine 5 mg daily. 12.Losartan 50 mg daily.  DIET:  Low-salt, low-cholesterol, 1800 calories ADA diet.  The patient has been advised to monitor blood pressure and blood sugar regularly and chart  post PTCA stent instructions have been given.  The patient will be scheduled for phase 2 cardiac rehab as outpatient.  CONDITION AT DISCHARGE:  Stable.  BRIEF HISTORY AND HOSPITAL COURSE:  [name] is an 83 year old male, with past medical history significant for coronary artery disease, history of silent inferior wall myocardial infarction in the past, status post PTCA stenting to 100% occluded RCA and PTCA stenting to obtuse marginal 3 in 2003, followed by PTCA stenting to LAD and left circumflex in 2013, hypertension, non-insulin-dependent diabetes mellitus, hyperlipidemia, remote tobacco abuse, and erectile dysfunction.  He came to the ER following a near syncopal episode.  The patient was noted to be ashen and hypotensive with blood pressure of 76/61 and heart rate of 155 by his wife on the monitor.  The patient states he felt dizzy, lightheaded, and felt he was going to pass out. His symptoms resolved after a few minutes after lying in the bed spontaneously.  The patient states he gets tired while working in the yard, lawn mowing and occasionally had episode of diaphoresis.  The patient denies any chest pain.  Denies palpitation.  Denies any syncopal episodes.  Denies such episodes in the past.  The patient had nuclear stress test  approximately a few months ago, which showed no evidence of ischemia, but progressive worsening of EF.  Last EF was 38%.  PHYSICAL EXAMINATION:  GENERAL:  He was alert, awake, and oriented x3, in no acute distress. VITAL SIGNS:  Blood pressure is 144/77 and pulse is 89. HEENT:  Conjunctivae are pink. NECK:  Supple.  No JVD.  No bruit. LUNGS:  Clear to auscultation without rhonchi or rales. CARDIOVASCULAR:  S1 and S2 were normal.  There were soft systolic murmur and S4 gallop. ABDOMEN:  Soft.  Bowel sounds are present.  Nontender. EXTREMITIES:  There was no clubbing, cyanosis, or edema.  LABORATORY DATA:  Two sets of troponin-I were negative.  His  sodium was 138, potassium was 3.4, glucose was 175, BUN was 12, and creatinine was 0.95.  Hemoglobin was 14.2, hematocrit was 40.2, and white count of 6.6. EKG showed normal sinus rhythm with no acute ischemic changes.  BRIEF HOSPITAL COURSE:  The patient was admitted to step-down unit.  The patient did not had any further episodes of tachycardia or hypotension during the hospital stay.  Due to multiple risk factors and multiple stents in the past and atypical presentation with history of exertional dyspnea, feeling weak and diaphoresis and also noninvasive nuclear stress test suggestive of intermediate risk stratification with worsening EF and possible VT.  The patient subsequently underwent left cardiac catheterization and was noted to have critical distal mid and distal junction RCA in-stent restenoses requiring PTCA stenting with excellent angiographic results.  The patient did not had any chest pain during the hospital stay.  His groin is stable with no evidence of hematoma or bruit.  The patient is ambulating in room without any problems.  The patient will be discharged home on above medications and will be followed up in my office in 1 week.  The patient will be scheduled for phase 2 cardiac rehab as outpatient.     Eduardo OsierMohan N. Sharyn LullHarwani, M.D.     MNH/MEDQ  D:  02/03/2017  T:  02/03/2017  Job:  [id number]  cc:   Eduardo OsierMohan N. Sharyn LullHarwani, M.D.

## 2017-02-06 ENCOUNTER — Other Ambulatory Visit: Payer: Self-pay | Admitting: *Deleted

## 2017-02-06 NOTE — Patient Outreach (Signed)
[facility]) Care Management  02/06/2017  Alexander GlimpseHoward L Donaldson 10/25/1933 [id number]  Subjective: Telephone call to patient's home number, no answer, left HIPAA compliant voicemail message, and requested call back.   Objective: Per chart review, patient hospitalized  02/01/17 - 02/03/17 for angina.  Status post 02/02/17 left heart cath and coronary angiography.   Patient also has a history of: diabetes, hypertension, and hyperlipidemia.   Assessment: Received [facility]MR Transition of care referral on 02/02/17.  Transition of care follow up pending patient contact.    Plan: RNCM will call patient for 2nd telephone outreach attempt, transition of care follow up, within 10 business days if no return call.   Dalary Hollar H. Gardiner Barefootooper RN, BSN, CCM [facility] Care Management [facility] Telephonic CM Phone: [phone] Fax: [phone]

## 2017-02-07 ENCOUNTER — Other Ambulatory Visit: Payer: Non-veteran care | Admitting: *Deleted

## 2017-02-07 NOTE — Patient Outreach (Signed)
[facility]) Care Management  02/07/2017  Alexander GlimpseHoward L Donaldson 08/25/1933 [id number]   Subjective: Telephone call to patient's home number, no answer, left HIPAA compliant voicemail message, and requested call back.   Objective: Per chart review, patient hospitalized  02/01/17 - 02/03/17 for angina.  Status post 02/02/17 left heart cath and coronary angiography.   Patient also has a history of: diabetes, hypertension, and hyperlipidemia.   Assessment: Received UMR Transition of care referral on 02/02/17.  Transition of care follow up pending patient contact.    Plan: RNCM will call patient for 3rd telephone outreach attempt, transition of care follow up, within 10 business days if no return call.   Mindie Rawdon H. Gardiner Barefootooper RN, BSN, CCM [facility] Care Management [facility] Telephonic CM Phone: [phone] Fax: [phone]

## 2017-02-09 ENCOUNTER — Encounter: Payer: Self-pay | Admitting: *Deleted

## 2017-02-09 ENCOUNTER — Other Ambulatory Visit: Payer: Self-pay | Admitting: *Deleted

## 2017-02-09 DIAGNOSIS — I255 Ischemic cardiomyopathy: Secondary | ICD-10-CM | POA: Diagnosis not present

## 2017-02-09 DIAGNOSIS — M1990 Unspecified osteoarthritis, unspecified site: Secondary | ICD-10-CM | POA: Diagnosis not present

## 2017-02-09 DIAGNOSIS — I252 Old myocardial infarction: Secondary | ICD-10-CM | POA: Diagnosis not present

## 2017-02-09 DIAGNOSIS — I2510 Atherosclerotic heart disease of native coronary artery without angina pectoris: Secondary | ICD-10-CM | POA: Diagnosis not present

## 2017-02-09 DIAGNOSIS — E119 Type 2 diabetes mellitus without complications: Secondary | ICD-10-CM | POA: Diagnosis not present

## 2017-02-09 DIAGNOSIS — I10 Essential (primary) hypertension: Secondary | ICD-10-CM | POA: Diagnosis not present

## 2017-02-09 DIAGNOSIS — E785 Hyperlipidemia, unspecified: Secondary | ICD-10-CM | POA: Diagnosis not present

## 2017-02-09 NOTE — Patient Outreach (Addendum)
[facility]) Care Management  02/09/2017  Alexander Donaldson 04/29/1934 [id number]   Subjective: Received voicemail from patient's wife states she is returning call for her husband who is very hard of hearing, request call back at mobile number, and she will facilitate call with patient.  Telephone call to patient's mobile number, spoke with patient, and HIPAA verified.  [facility] verbal authorization to speak with wife Alexander Stagers(Karen Cudworth) regarding his healthcare as needed.  Spoke with Mrs. Alexander Donaldson, discussed [facility] Care Management [facility] Transition of care follow up, wive voiced understanding, and is in agreement to follow up on patient's behalf.   Wife states patient is doing well, has follow up appointment with cardiologist earlier today, primary MD is [facility] and is continuing to increase walking distance each day.   States patient will be starting cardiac rehab at a [facility] facility in the near future.   Wife states patient was very attentive prior to hospitalization, still drives, does yard work, and works on cars.  Wife voices understanding of patient's medical diagnosis and treatment plan.  States patient has a blood pressure cuff and able to monitor blood pressure at home as needed.  States patient  is accessing the following [facility] benefits: outpatient pharmacy, hospital indemnity (did not choose this benefit), and will call [employer] to start family medical leave act (FMLA) process in the future if needed. Wife states patient  does not have any education material, transition of care, care coordination, disease management, disease monitoring, transportation, community resource, or pharmacy needs at this time. States she is very appreciative of the follow up and is in agreement to receive [facility] information on patient's behalf.  Objective: Per chart review, patient hospitalized  02/01/17 - 02/03/17 for angina. Status post  02/02/17 left heart cath and coronary angiography. Patient also has a history of: diabetes, hypertension, and hyperlipidemia.   Assessment: Received UMR Transition of care referral on 02/02/17. Transition of care follow up completed, no care management needs, and will proceed with case closure.   Plan: RNCM will send patient successful outreach letter, [facility] pamphlet, and magnet. RNCM will send case closure due to follow up completed / no care management needs request to Iverson AlaminLaura Greeson at [facility]. RNCM will send primary MD assignment update to Iverson AlaminLaura Greeson at [facility].     Jourdan Durbin H. Gardiner Barefootooper RN, BSN, CCM [facility] Care Management [facility] Telephonic CM Phone: [phone] Fax: [phone]

## 2017-02-10 ENCOUNTER — Telehealth (HOSPITAL_COMMUNITY): Payer: Self-pay

## 2017-02-10 NOTE — Telephone Encounter (Signed)
I called and left message on patient wife voicemail per CRP phase 1 card, to return my call. [place] [employer] [place] with [place].

## 2017-02-13 ENCOUNTER — Telehealth (HOSPITAL_COMMUNITY): Payer: Self-pay

## 2017-02-13 NOTE — Telephone Encounter (Signed)
Patient wife returned my call. I informed patient wife that [place] here due to us being a none [facility] facility. Patient wife said that she would contact VA to get the authorization for patient to be seen. Patient wife informed that we would not be able to see patient until [place]. Patient wife verbalized understanding.

## 2017-02-21 ENCOUNTER — Other Ambulatory Visit: Payer: Self-pay | Admitting: *Deleted

## 2017-02-21 NOTE — Patient Outreach (Signed)
[facility]) Care Management  02/21/2017  Alexander Donaldson 03/22/1934 [id number]   Subjective: Received voicemail message from patient's wife Alexander Stagers(Karen Majchrzak), states she is calling back in reference to cardiac rehab referral and requested call back at mobile number.  Per previous conversation, patient has given RNCM verbal authorization to speak with wife regarding healthcare needs as needed.  Telephone call to patient's  wife's mobile number, no answer, left HIPAA compliant voicemail message, and requested call back.   Objective: Per chart review, patient hospitalized  02/01/17 - 02/03/17 for angina. Status post 02/02/17 left heart cath and coronary angiography. Patient also has a history of: diabetes, hypertension, and hyperlipidemia.   Assessment: Received [facility]MR Transition of care referral on 02/02/17. [facility] on 02/21/17 from patient's wife.  Transition of care follow up completed, no care management needs, and [facility] follow [facility] pending patient contact.  Plan: RNCM will call patient's wife for 2nd telephone outreach attempt,[facility] Consult follow up, within 10 business days if no return call.     Phyllis Whitefield H. Gardiner Barefootooper RN, BSN, CCM [facility] Telephonic CM Phone: [phone] Fax: [phone]

## 2017-02-22 ENCOUNTER — Other Ambulatory Visit: Payer: Self-pay | Admitting: *Deleted

## 2017-02-22 MED FILL — METOPROLOL SUCC ER 25 MG TA: 25 | 90 days supply | Qty: 90 | Fill #0

## 2017-02-22 NOTE — Patient Outreach (Addendum)
[facility]) Care Management  02/22/2017  Alexander Donaldson 09/02/1933 [id number]   Subjective: Telephone call to patient's wife mobile number, have received verbal authorization previously to from patient to speak with wifeCloyd Donaldson( Alexander Donaldson) regarding his healthcare needs as needed.  Spoke with patient's wife, she stated patient's name, date of birth, and address.  She remembers speaking with this RNCM in the past.  [facility] follow [facility], wife voiced understanding, and is in agreement to follow up.   Wife states she was returning call regarding cardiac rehab authorization.  RNCM advised wife per patient's chart Alexander Donaldson had called her regarding cardiac rehab authorization and not this RNCM.   Wife apologized for the confusion, states she will contact Alexander Donaldson [phone]( [phone]) to follow up regarding cardiac rehab authorization, and contact this RNCM if any further assistance needed. Telephone to call Cardiac Rehab, spoke with Alexander Donaldson, left HIPAA compliant message for [facility], and requested call back.  Objective: Per chart review, patient hospitalized  02/01/17 - 02/03/17 for angina. Status post 02/02/17 left heart cath and coronary angiography. Patient also has a history of: diabetes, hypertension, and hyperlipidemia.   Assessment: Received [facility] Transition of care referral on 02/02/17. Transition of care follow up completed, [facility] follow [facility] completed, no care management needs at this time from this RNCM, and case will remain closed.  Plan: Case will remain closed and wife will contact RNCM if any further needs. RNCM will send case status update to [facility].         Alexander Caudill H. Gardiner Barefootooper RN, BSN, CCM [facility] Care [facility] Telephonic CM Phone: [phone] Fax: [phone]

## 2017-02-24 ENCOUNTER — Telehealth (HOSPITAL_COMMUNITY): Payer: Self-pay

## 2017-02-24 MED FILL — ATORVASTATIN 80 MG TABLET: 80 | 90 days supply | Qty: 90 | Fill #2

## 2017-02-24 NOTE — Telephone Encounter (Signed)
Late entry - 02/23/17 - Patient wife Alexander Donaldson) contacted me inquiring did we receive [place] authorization. Patient wife stated that she had spoken to someone at the [place] and they were going to get [place] authorization for patient to be seen here at [facility] cardiac Rehab. I informed patient wife that we have not received authorization and that it usually takes sometime to receive from [place]. Patient wife inquired about filing [facility] UMR and Medicare. I informed wife that patient only has Medicare Part A and this does not cover cardiac rehab. Patient wife wanted to know if we can file [facility] UMR only, patient wife informed they would be responsible for what ever insurance does not cover, since choosing not to file VA insurance at this time. Patient wife asked that I proceed with the referral using [facility] without the VA authorization at this time.

## 2017-03-01 DIAGNOSIS — I2510 Atherosclerotic heart disease of native coronary artery without angina pectoris: Secondary | ICD-10-CM | POA: Diagnosis not present

## 2017-03-01 DIAGNOSIS — I252 Old myocardial infarction: Secondary | ICD-10-CM | POA: Diagnosis not present

## 2017-03-01 DIAGNOSIS — E785 Hyperlipidemia, unspecified: Secondary | ICD-10-CM | POA: Diagnosis not present

## 2017-03-01 DIAGNOSIS — E119 Type 2 diabetes mellitus without complications: Secondary | ICD-10-CM | POA: Diagnosis not present

## 2017-03-01 DIAGNOSIS — R55 Syncope and collapse: Secondary | ICD-10-CM | POA: Diagnosis not present

## 2017-03-01 DIAGNOSIS — I255 Ischemic cardiomyopathy: Secondary | ICD-10-CM | POA: Diagnosis not present

## 2017-03-01 DIAGNOSIS — M1990 Unspecified osteoarthritis, unspecified site: Secondary | ICD-10-CM | POA: Diagnosis not present

## 2017-03-01 DIAGNOSIS — I10 Essential (primary) hypertension: Secondary | ICD-10-CM | POA: Diagnosis not present

## 2017-03-02 ENCOUNTER — Telehealth (HOSPITAL_COMMUNITY): Payer: Self-pay

## 2017-03-03 ENCOUNTER — Telehealth (HOSPITAL_COMMUNITY): Payer: Self-pay

## 2017-03-06 ENCOUNTER — Telehealth (HOSPITAL_COMMUNITY): Payer: Self-pay

## 2017-03-06 MED FILL — LOSARTAN POTASSIUM 50 MG TA: 50 | 90 days supply | Qty: 90 | Fill #1

## 2017-03-06 MED FILL — CLOPIDOGREL 75 MG TABLET: 75 | 90 days supply | Qty: 90 | Fill #0

## 2017-03-06 NOTE — Telephone Encounter (Signed)
*  Updated insurance benefits* UMR - no co-payment, deductible $250/$250 has been met, out of pocket $7350/$7350 has been met, 20% co-insurance and no pre-certification. Due to deductible and out of pocket being met 20% co-insurance does not apply.  Benefits verified by [facility] - fax on 03/06/17 @ 11:31am.

## 2017-03-07 ENCOUNTER — Encounter (HOSPITAL_COMMUNITY)
Admission: RE | Admit: 2017-03-07 | Discharge: 2017-03-07 | Disposition: A | Discharge status: Home / Self Care | Payer: 59 | Source: Ambulatory Visit | Attending: Cardiology | Admitting: Cardiology

## 2017-03-07 ENCOUNTER — Encounter (HOSPITAL_COMMUNITY): Payer: Self-pay

## 2017-03-07 VITALS — BP 130/60 | HR 66 | Ht 66.5 in | Wt 174.8 lb

## 2017-03-07 DIAGNOSIS — R55 Syncope and collapse: Secondary | ICD-10-CM | POA: Diagnosis not present

## 2017-03-07 DIAGNOSIS — Z955 Presence of coronary angioplasty implant and graft: Secondary | ICD-10-CM | POA: Diagnosis not present

## 2017-03-07 NOTE — Progress Notes (Signed)
MONDARIUS LANSON 83 y.o. male DOB: 01-25-1934 MRN: [id number]      Nutrition Note  1. 02/02/17 Stented coronary artery mid RCA    Past Medical History:  Diagnosis Date  . Coronary artery disease   . Diabetes mellitus   . Diverticulitis   . Hypertension    Meds reviewed. Glipizide noted  HT: Ht Readings from Last 1 Encounters:  03/07/17 5' 6.5" (1.689 m)    WT: Wt Readings from Last 3 Encounters:  03/07/17 174 lb 13.2 oz (79.3 kg)  02/01/17 170 lb (77.1 kg)  03/28/14 180 lb (81.6 kg)     BMI 27.8   Current tobacco use? No  Labs:  Lipid Panel     Component Value Date/Time   CHOL 135 02/02/2017 0115   TRIG 66 02/02/2017 0115   HDL 41 02/02/2017 0115   CHOLHDL 3.3 02/02/2017 0115   VLDL 13 02/02/2017 0115   LDLCALC 81 02/02/2017 0115    Lab Results  Component Value Date   HGBA1C 8.4 (H) 02/01/2017   CBG (last 3)  No results for input(s): GLUCAP in the last 72 hours.  Nutrition Note Spoke with pt. Nutrition plan and goals reviewed with pt. Pt is following Step 2 of the Therapeutic Lifestyle Changes diet. Pt is diabetic. Last A1c indicates blood glucose not optimally controlled. Pt checks fasting CBG's 1 time a day. Fasting CBG's reportedly 110 mg/dL this morning. Age-appropriate nutrition recommendations re: diet and DM discussed. Pt expressed understanding of the information reviewed. Pt aware of nutrition education classes offered and plans on attending nutrition classes.  Nutrition Diagnosis ? Food-and nutrition-related knowledge deficit related to lack of exposure to information as related to diagnosis of: ? CVD ? DM  Nutrition Intervention ? Pt's individual nutrition plan and goals reviewed with pt.  Nutrition Goal(s):  ? CBG concentrations in the normal range or as close to normal as is safely possible.  Plan:  Pt to attend nutrition classes ? Nutrition I ? Nutrition II ? Portion Distortion ? Diabetes Blitz ? Diabetes Q & A Will provide client-centered  nutrition education as part of interdisciplinary care.   Monitor and evaluate progress toward nutrition goal with team.  Mickle Plumb, M.Ed, RD, LDN, CDE 03/07/2017 3:14 PM

## 2017-03-07 NOTE — Progress Notes (Addendum)
Cardiac Rehab Medication Review by a Pharmacist  Does the patient  feel that his/her medications are working for him/her?  yes  Has the patient been experiencing any side effects to the medications prescribed?  yes  Does the patient measure his/her own blood pressure or blood glucose at home?  yes   Does the patient have any problems obtaining medications due to transportation or finances?   no  Understanding of regimen: good Understanding of indications: good Potential of compliance: good    Pharmacist comments: pt wife manages pt medication regimen.  She is retired Education officer, community. Pt HOH, deaf in right ear.  Pt and wife deny problems with medication access.  Pt PCP is Dr[facility] Rodolph Bong at [facility] (Team [facility])  Pt does get some medications filled at [facility] and some medications at [facility] .  Pt does c/o occasional cough.  Unclear if medication side effect. Instructed to discuss with Dr. Sharyn Lull or Dr. Hardie Lora if persists or worsens.  Understanding verbalized.   Deveron Furlong, RN, BSN   Cardiac Pulmonary Rehab     Wynonia Musty Rion 03/07/2017 8:34 AM

## 2017-03-07 NOTE — Progress Notes (Addendum)
Cardiac Individual Treatment Plan  Patient Details  Name: Alexander Donaldson MRN: [id number] Date of Birth: 02/01/1934 Referring Provider:     CARDIAC REHAB PHASE II ORIENTATION from 03/07/2017 in [facility] CARDIAC REHAB  Referring Provider  Rinaldo Cloud MD      Initial Encounter Date:    CARDIAC REHAB PHASE II ORIENTATION from 03/07/2017 in [facility] CARDIAC REHAB  Date  03/07/17  Referring Provider  Rinaldo Cloud MD      Visit Diagnosis: 02/02/17 Stented coronary artery mid RCA  Patient's Home Medications on Admission:  Current Outpatient Prescriptions:  .  amiodarone (PACERONE) 200 MG tablet, Take 1 tablet (200 mg total) by mouth daily., Disp: 30 tablet, Rfl: 3 .  amLODipine (NORVASC) 5 MG tablet, Take 1 tablet (5 mg total) by mouth daily. (Patient taking differently: Take 5 mg by mouth 2 (two) times daily. ), Disp: 30 tablet, Rfl: 3 .  aspirin 81 MG tablet, Take 81 mg by mouth daily.  , Disp: , Rfl:  .  atorvastatin (LIPITOR) 80 MG tablet, Take 80 mg by mouth at bedtime., Disp: , Rfl:  .  clopidogrel (PLAVIX) 75 MG tablet, Take 75 mg by mouth daily., Disp: , Rfl: 3 .  dextromethorphan (DELSYM) 30 MG/5ML liquid, Take 60 mg by mouth 2 (two) times daily as needed for cough., Disp: , Rfl:  .  glipiZIDE (GLUCOTROL) 10 MG tablet, Take 10 mg by mouth 2 (two) times daily before a meal. Take 30 minutes before meals, Disp: , Rfl:  .  losartan (COZAAR) 50 MG tablet, Take 1 tablet (50 mg total) by mouth daily., Disp: 30 tablet, Rfl: 3 .  metoprolol succinate (TOPROL-XL) 25 MG 24 hr tablet, Take 25 mg by mouth daily., Disp: , Rfl: 3 .  Omega-3 Fatty Acids (FISH OIL) 1000 MG CAPS, Take 1,000 mg by mouth 2 (two) times daily., Disp: , Rfl:  .  psyllium (HYDROCIL/METAMUCIL) 95 % PACK, Take 1 packet by mouth 2 (two) times daily. , Disp: , Rfl:  .  nitroGLYCERIN (NITROSTAT) 0.4 MG SL tablet, Place 1 tablet (0.4 mg total) under the tongue every 5 (five) minutes x 3  doses as needed for chest pain., Disp: 25 tablet, Rfl: 3  Past Medical History: Past Medical History:  Diagnosis Date  . Coronary artery disease   . Diabetes mellitus   . Diverticulitis   . Hypertension     Tobacco Use: History  Smoking Status  . Never Smoker  Smokeless Tobacco  . Never Used    Labs: Recent Review Flowsheet Data    Labs for ITP Cardiac and Pulmonary Rehab Latest Ref Rng & Units 03/16/2012 03/16/2012 03/17/2012 02/01/2017 02/02/2017   Cholestrol 0 - 200 mg/dL - - 409(W) - 119   LDLCALC 0 - 99 mg/dL - - 147(W) - 81   HDL >29 mg/dL - - 56(O) - 41   Trlycerides <150 mg/dL - - 130(Q) - 66   Hemoglobin A1c 4.8 - 5.6 % 8.5(H) 8.6(H) - 8.4(H) -      Capillary Blood Glucose: Lab Results  Component Value Date   GLUCAP 151 (H) 02/03/2017   GLUCAP 184 (H) 02/02/2017   GLUCAP 164 (H) 02/02/2017   GLUCAP 137 (H) 02/02/2017   GLUCAP 149 (H) 02/02/2017     Exercise Target Goals: Date: 03/07/17  Exercise Program Goal: Individual exercise prescription set with THRR, safety & activity barriers. Participant demonstrates ability to understand and report RPE using BORG scale, to self-measure pulse  accurately, and to acknowledge the importance of the exercise prescription.  Exercise Prescription Goal: Starting with aerobic activity 30 plus minutes a day, 3 days per week for initial exercise prescription. Provide home exercise prescription and guidelines that participant acknowledges understanding prior to discharge.  Activity Barriers & Risk Stratification:     Activity Barriers & Cardiac Risk Stratification - 03/07/17 0851      Activity Barriers & Cardiac Risk Stratification   Activity Barriers History of Falls;Deconditioning;Muscular Weakness;Balance Concerns;Other (comment)   Comments dislodged or disclocated R shoulder, knee issues (B)   Cardiac Risk Stratification High      6 Minute Walk:     6 Minute Walk    Row Name 03/07/17 1131         6 Minute Walk    Phase Initial     Distance 1635 feet     Walk Time 6 minutes     # of Rest Breaks 0     MPH 3.1     METS 3.2     RPE 9     VO2 Peak 11.3     Symptoms No     Resting HR 66 bpm     Resting BP 130/60     Resting Oxygen Saturation  98 %     Exercise Oxygen Saturation  during 6 min walk 98 %     Max Ex. HR 111 bpm     Max Ex. BP 164/60     2 Minute Post BP 120/60        Oxygen Initial Assessment:   Oxygen Re-Evaluation:   Oxygen Discharge (Final Oxygen Re-Evaluation):   Initial Exercise Prescription:     Initial Exercise Prescription - 03/07/17 1100      Date of Initial Exercise RX and Referring Provider   Date 03/07/17   Referring Provider Rinaldo Cloud MD     Bike   Level 0.07   Minutes 10   METs 2.65     NuStep   Level 3   SPM 80   Minutes 10   METs 2.4     Track   Laps 10   Minutes 10   METs 2.74     Prescription Details   Frequency (times per week) 3   Duration Progress to 30 minutes of continuous aerobic without signs/symptoms of physical distress     Intensity   THRR 40-80% of Max Heartrate 56-111   Ratings of Perceived Exertion 11-13   Perceived Dyspnea 0-4     Progression   Progression Continue to progress workloads to maintain intensity without signs/symptoms of physical distress.     Resistance Training   Training Prescription Yes   Weight 3lbs   Reps 10-15      Perform Capillary Blood Glucose checks as needed.  Exercise Prescription Changes:   Exercise Comments:   Exercise Goals and Review:     Exercise Goals    Row Name 03/07/17 [phone]             Exercise Goals   Increase Physical Activity Yes       Intervention Provide advice, education, support and counseling about physical activity/exercise needs.;Develop an individualized exercise prescription for aerobic and resistive training based on initial evaluation findings, risk stratification, comorbidities and participant's personal goals.       Expected Outcomes  Achievement of increased cardiorespiratory fitness and enhanced flexibility, muscular endurance and strength shown through measurements of functional capacity and personal statement of participant.  Increase Strength and Stamina Yes  get back to working on cars        Intervention Provide advice, education, support and counseling about physical activity/exercise needs.;Develop an individualized exercise prescription for aerobic and resistive training based on initial evaluation findings, risk stratification, comorbidities and participant's personal goals.       Expected Outcomes Achievement of increased cardiorespiratory fitness and enhanced flexibility, muscular endurance and strength shown through measurements of functional capacity and personal statement of participant.       Able to understand and use rate of perceived exertion (RPE) scale Yes       Intervention Provide education and explanation on how to use RPE scale       Expected Outcomes Short Term: Able to use RPE daily in rehab to express subjective intensity level;Long Term:  Able to use RPE to guide intensity level when exercising independently       Understanding of Exercise Prescription Yes       Intervention Provide education, explanation, and written materials on patient's individual exercise prescription       Expected Outcomes Short Term: Able to explain program exercise prescription;Long Term: Able to explain home exercise prescription to exercise independently          Exercise Goals Re-Evaluation :    Discharge Exercise Prescription (Final Exercise Prescription Changes):   Nutrition:  Target Goals: Understanding of nutrition guidelines, daily intake of sodium 1500mg , cholesterol 200mg , calories 30% from fat and 7% or less from saturated fats, daily to have 5 or more servings of fruits and vegetables.  Biometrics:     Pre Biometrics - 03/07/17 1135      Pre Biometrics   % Body Fat 29.2 %       Nutrition  Therapy Plan and Nutrition Goals:   Nutrition Discharge: Nutrition Scores:   Nutrition Goals Re-Evaluation:   Nutrition Goals Re-Evaluation:   Nutrition Goals Discharge (Final Nutrition Goals Re-Evaluation):   Psychosocial: Target Goals: Acknowledge presence or absence of significant depression and/or stress, maximize coping skills, provide positive support system. Participant is able to verbalize types and ability to use techniques and skills needed for reducing stress and depression.  Initial Review & Psychosocial Screening:     Initial Psych Review & Screening - 03/07/17 0953      Initial Review   Current issues with None Identified     Family Dynamics   Good Support System? Yes  spouse, family and friends   Comments upon brief assessment, no psychosocial needs identified, no interventions necessary      Barriers   Psychosocial barriers to participate in program There are no identifiable barriers or psychosocial needs.     Screening Interventions   Interventions Encouraged to exercise      Quality of Life Scores:     Quality of Life - 03/07/17 0821      Quality of Life Scores   Health/Function Pre 28.4 %   Socioeconomic Pre 30 %   Psych/Spiritual Pre 30 %   Family Pre 30 %   GLOBAL Pre 29.3 %      PHQ-9: Recent Review Flowsheet Data    Depression screen [facility] 2/9 04/23/2012   Decreased Interest 0   Down, Depressed, Hopeless 0   PHQ - 2 Score 0     Interpretation of Total Score  Total Score Depression Severity:  1-4 = Minimal depression, 5-9 = Mild depression, 10-14 = Moderate depression, 15-19 = Moderately severe depression, 20-27 = Severe depression   Psychosocial  Evaluation and Intervention:   Psychosocial Re-Evaluation:   Psychosocial Discharge (Final Psychosocial Re-Evaluation):   Vocational Rehabilitation: Provide vocational rehab assistance to qualifying candidates.   Vocational Rehab Evaluation & Intervention:     Vocational  Rehab - 03/07/17 0352      Initial Vocational Rehab Evaluation & Intervention   Assessment shows need for Vocational Rehabilitation No      Education: Education Goals: Education classes will be provided on a weekly basis, covering required topics. Participant will state understanding/return demonstration of topics presented.  Learning Barriers/Preferences:     Learning Barriers/Preferences - 03/07/17 0851      Learning Barriers/Preferences   Learning Barriers Hearing;Sight;Reading   Learning Preferences Video;Verbal Instruction;Written Material;Skilled Demonstration;Pictoral      Education Topics: Count Your Pulse:  -Group instruction provided by verbal instruction, demonstration, patient participation and written materials to support subject.  Instructors address importance of being able to find your pulse and how to count your pulse when at home without a heart monitor.  Patients get hands on experience counting their pulse with staff help and individually.   Heart Attack, Angina, and Risk Factor Modification:  -Group instruction provided by verbal instruction, video, and written materials to support subject.  Instructors address signs and symptoms of angina and heart attacks.    Also discuss risk factors for heart disease and how to make changes to improve heart health risk factors.   Functional Fitness:  -Group instruction provided by verbal instruction, demonstration, patient participation, and written materials to support subject.  Instructors address safety measures for doing things around the house.  Discuss how to get up and down off the floor, how to pick things up properly, how to safely get out of a chair without assistance, and balance training.   Meditation and Mindfulness:  -Group instruction provided by verbal instruction, patient participation, and written materials to support subject.  Instructor addresses importance of mindfulness and meditation practice to help  reduce stress and improve awareness.  Instructor also leads participants through a meditation exercise.    Stretching for Flexibility and Mobility:  -Group instruction provided by verbal instruction, patient participation, and written materials to support subject.  Instructors lead participants through series of stretches that are designed to increase flexibility thus improving mobility.  These stretches are additional exercise for major muscle groups that are typically performed during regular warm up and cool down.   Hands Only CPR:  -Group verbal, video, and participation provides a basic overview of AHA guidelines for community CPR. Role-play of emergencies allow participants the opportunity to practice calling for help and chest compression technique with discussion of AED use.   Hypertension: -Group verbal and written instruction that provides a basic overview of hypertension including the most recent diagnostic guidelines, risk factor reduction with self-care instructions and medication management.    Nutrition I class: Heart Healthy Eating:  -Group instruction provided by PowerPoint slides, verbal discussion, and written materials to support subject matter. The instructor gives an explanation and review of the Therapeutic Lifestyle Changes diet recommendations, which includes a discussion on lipid goals, dietary fat, sodium, fiber, plant stanol/sterol esters, sugar, and the components of a well-balanced, healthy diet.   Nutrition II class: Lifestyle Skills:  -Group instruction provided by PowerPoint slides, verbal discussion, and written materials to support subject matter. The instructor gives an explanation and review of label reading, grocery shopping for heart health, heart healthy recipe modifications, and ways to make healthier choices when eating out.   Diabetes Question & Answer:  -  Group instruction provided by PowerPoint slides, verbal discussion, and written materials to  support subject matter. The instructor gives an explanation and review of diabetes co-morbidities, pre- and post-prandial blood glucose goals, pre-exercise blood glucose goals, signs, symptoms, and treatment of hypoglycemia and hyperglycemia, and foot care basics.   Diabetes Blitz:  -Group instruction provided by PowerPoint slides, verbal discussion, and written materials to support subject matter. The instructor gives an explanation and review of the physiology behind type 1 and type 2 diabetes, diabetes medications and rational behind using different medications, pre- and post-prandial blood glucose recommendations and Hemoglobin A1c goals, diabetes diet, and exercise including blood glucose guidelines for exercising safely.    Portion Distortion:  -Group instruction provided by PowerPoint slides, verbal discussion, written materials, and food models to support subject matter. The instructor gives an explanation of serving size versus portion size, changes in portions sizes over the last 20 years, and what consists of a serving from each food group.   Stress Management:  -Group instruction provided by verbal instruction, video, and written materials to support subject matter.  Instructors review role of stress in heart disease and how to cope with stress positively.     Exercising on Your Own:  -Group instruction provided by verbal instruction, power point, and written materials to support subject.  Instructors discuss benefits of exercise, components of exercise, frequency and intensity of exercise, and end points for exercise.  Also discuss use of nitroglycerin and activating EMS.  Review options of places to exercise outside of rehab.  Review guidelines for sex with heart disease.   Cardiac Drugs I:  -Group instruction provided by verbal instruction and written materials to support subject.  Instructor reviews cardiac drug classes: antiplatelets, anticoagulants, beta blockers, and statins.   Instructor discusses reasons, side effects, and lifestyle considerations for each drug class.   Cardiac Drugs II:  -Group instruction provided by verbal instruction and written materials to support subject.  Instructor reviews cardiac drug classes: angiotensin converting enzyme inhibitors (ACE-I), angiotensin II receptor blockers (ARBs), nitrates, and calcium channel blockers.  Instructor discusses reasons, side effects, and lifestyle considerations for each drug class.   Anatomy and Physiology of the Circulatory System:  Group verbal and written instruction and models provide basic cardiac anatomy and physiology, with the coronary electrical and arterial systems. Review of: AMI, Angina, Valve disease, Heart Failure, Peripheral Artery Disease, Cardiac Arrhythmia, Pacemakers, and the ICD.   Other Education:  -Group or individual verbal, written, or video instructions that support the educational goals of the cardiac rehab program.   Knowledge Questionnaire Score:     Knowledge Questionnaire Score - 03/07/17 0955      Knowledge Questionnaire Score   Pre Score 21/24      Core Components/Risk Factors/Patient Goals at Admission:     Personal Goals and Risk Factors at Admission - 03/07/17 1135      Core Components/Risk Factors/Patient Goals on Admission   Diabetes Yes   Intervention Provide education about proper nutrition, including hydration, and aerobic/resistive exercise prescription along with prescribed medications to achieve blood glucose in normal ranges: Fasting glucose 65-99 mg/dL;Provide education about signs/symptoms and action to take for hypo/hyperglycemia.   Expected Outcomes Short Term: Participant verbalizes understanding of the signs/symptoms and immediate care of hyper/hypoglycemia, proper foot care and importance of medication, aerobic/resistive exercise and nutrition plan for blood glucose control.;Long Term: Attainment of HbA1C < 7%.   Hypertension Yes    Intervention Provide education on lifestyle modifcations including regular physical activity/exercise, weight  management, moderate sodium restriction and increased consumption of fresh fruit, vegetables, and low fat dairy, alcohol moderation, and smoking cessation.;Monitor prescription use compliance.   Expected Outcomes Short Term: Continued assessment and intervention until BP is < 140/66mm HG in hypertensive participants. < 130/65mm HG in hypertensive participants with diabetes, heart failure or chronic kidney disease.;Long Term: Maintenance of blood pressure at goal levels.   Lipids Yes   Intervention Provide education and support for participant on nutrition & aerobic/resistive exercise along with prescribed medications to achieve LDL 70mg , HDL >40mg .   Expected Outcomes Short Term: Participant states understanding of desired cholesterol values and is compliant with medications prescribed. Participant is following exercise prescription and nutrition guidelines.;Long Term: Cholesterol controlled with medications as prescribed, with individualized exercise RX and with personalized nutrition plan. Value goals: LDL < 70mg , HDL > 40 mg.      Core Components/Risk Factors/Patient Goals Review:    Core Components/Risk Factors/Patient Goals at Discharge (Final Review):    ITP Comments:     ITP Comments    Row Name 03/07/17 0814           ITP Comments Dr. Armanda Magic, Medical Director           Comments: .Patient attended orientation from 0801 to [phone]  to review rules and guidelines for program. Completed 6 minute walk test, Intitial ITP, and exercise prescription.  VSS. Telemetry-sinus rhythm,  Asymptomatic.

## 2017-03-15 ENCOUNTER — Encounter (HOSPITAL_COMMUNITY)
Admission: RE | Admit: 2017-03-15 | Discharge: 2017-03-15 | Disposition: A | Discharge status: Home / Self Care | Payer: Non-veteran care | Source: Ambulatory Visit | Attending: Cardiology | Admitting: Cardiology

## 2017-03-15 DIAGNOSIS — Z955 Presence of coronary angioplasty implant and graft: Secondary | ICD-10-CM | POA: Insufficient documentation

## 2017-03-15 DIAGNOSIS — R55 Syncope and collapse: Secondary | ICD-10-CM | POA: Insufficient documentation

## 2017-03-15 LAB — GLUCOSE, CAPILLARY
GLUCOSE-CAPILLARY: 111 mg/dL — AB (ref 65–99)
Glucose-Capillary: 167 mg/dL — ABNORMAL HIGH (ref 65–99)

## 2017-03-15 NOTE — Progress Notes (Signed)
Daily Session Note  Patient Details  Name: Alexander Donaldson MRN: [id number] Date of Birth: Feb 26, 1934 Referring Provider:     CARDIAC REHAB PHASE II ORIENTATION from 03/07/2017 in [facility]  Referring Provider  Charolette Forward MD      Encounter Date: 03/15/2017  Check In:     Session Check In - 03/15/17 0946      Check-In   Location [facility]Cardiac [facility] Pul[facility]   Staff Present Barnet Pall, RN, Deland Pretty, MS, ACSM CEP, Exercise Physiologist;Amber Fair, [place], ACSM RCEP, Exercise Physiologist;Carlette Wilber Oliphant, RN, BSN   Supervising physician immediately available to respond to emergencies [facility] immediately available   Physician(s) Dr. Bonner Puna   Medication changes reported     No   Fall or balance concerns reported    No   Tobacco Cessation No Change   Warm-up and Cool-down Performed as group-led instruction   Resistance Training Performed No   VAD Patient? No     Pain Assessment   Currently in Pain? No/denies   Multiple Pain Sites No      Capillary Blood Glucose: Results for orders placed or performed during the hospital encounter of 03/15/17 (from the past 24 hour(s))  Glucose, capillary     Status: Abnormal   Collection Time: 03/15/17  8:49 AM  Result Value Ref Range   Glucose-Capillary 167 (H) 65 - 99 mg/dL  Glucose, capillary     Status: Abnormal   Collection Time: 03/15/17  9:40 AM  Result Value Ref Range   Glucose-Capillary 111 (H) 65 - 99 mg/dL      History  Smoking Status  . Never Smoker  Smokeless Tobacco  . Never Used    Goals Met:  Exercise tolerated well Personal goals reviewed No report of cardiac concerns or symptoms  Goals Unmet:  RPE  Comments:  Pt started cardiac rehab today. Pt has some dementia but has a very delightful disposition. Pt tolerated light exercise with verbal assistance from volunteers to help pt.  Pt tolerated exercise without difficulty. VSS, telemetry- Sr with no ectopy,  asymptomatic.  Medication list reconciled. Pt denies barriers to medication compliance. Pt depends on his wife to help him with his medications. PSYCHOSOCIAL ASSESSMENT:  PHQ-0. Pt exhibits positive coping skills, hopeful outlook with supportive family. Pt wife is very involved in his care.  Pt states, "he would be dead by now if it wasn't for her".  Pt completed pre assessment quality of life survey.  Pt scored the following :     Quality of Life - 03/07/17 0821      Quality of Life Scores   Health/Function Pre 28.4 %   Socioeconomic Pre 30 %   Psych/Spiritual Pre 30 %   Family Pre 30 %   GLOBAL Pre 29.3 %     No psychosocial needs identified at this time, no psychosocial interventions necessary.    Pt enjoys work on cars, work around [employer].  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 Will monitor pt progress toward achieving this goals with periodic check in.  Pt oriented to exercise equipment and routine.  Understanding verbalized. [name] RN, BSN Cardiac and Pulmonary Rehab Nurse Navigator      Dr. Fransico Him is Medical Director for Cardiac Rehab at [facility].

## 2017-03-17 ENCOUNTER — Encounter (HOSPITAL_COMMUNITY)
Admission: RE | Admit: 2017-03-17 | Discharge: 2017-03-17 | Disposition: A | Discharge status: Home / Self Care | Payer: Non-veteran care | Source: Ambulatory Visit | Attending: Cardiology | Admitting: Cardiology

## 2017-03-17 DIAGNOSIS — Z955 Presence of coronary angioplasty implant and graft: Secondary | ICD-10-CM

## 2017-03-17 DIAGNOSIS — R55 Syncope and collapse: Secondary | ICD-10-CM | POA: Diagnosis not present

## 2017-03-17 LAB — GLUCOSE, CAPILLARY
GLUCOSE-CAPILLARY: 221 mg/dL — AB (ref 65–99)
GLUCOSE-CAPILLARY: 141 mg/dL — AB (ref 65–99)

## 2017-03-20 ENCOUNTER — Encounter (HOSPITAL_COMMUNITY)
Admission: RE | Admit: 2017-03-20 | Discharge: 2017-03-20 | Disposition: A | Discharge status: Home / Self Care | Payer: Non-veteran care | Source: Ambulatory Visit | Attending: Cardiology | Admitting: Cardiology

## 2017-03-20 DIAGNOSIS — Z955 Presence of coronary angioplasty implant and graft: Secondary | ICD-10-CM

## 2017-03-20 DIAGNOSIS — R55 Syncope and collapse: Secondary | ICD-10-CM | POA: Diagnosis not present

## 2017-03-22 ENCOUNTER — Encounter (HOSPITAL_COMMUNITY)
Admission: RE | Admit: 2017-03-22 | Discharge: 2017-03-22 | Disposition: A | Discharge status: Home / Self Care | Payer: Non-veteran care | Source: Ambulatory Visit | Attending: Cardiology | Admitting: Cardiology

## 2017-03-22 DIAGNOSIS — Z955 Presence of coronary angioplasty implant and graft: Secondary | ICD-10-CM

## 2017-03-22 DIAGNOSIS — R55 Syncope and collapse: Secondary | ICD-10-CM | POA: Diagnosis not present

## 2017-03-22 NOTE — Progress Notes (Signed)
Cardiac Individual Treatment Plan  Patient Details  Name: SHANNA STRENGTH MRN: [id number] Date of Birth: [birth year] Referring Provider:     CARDIAC REHAB PHASE II ORIENTATION from 03/07/2017 in [facility] CARDIAC REHAB  Referring Provider  Rinaldo Cloud MD      Initial Encounter Date:    CARDIAC REHAB PHASE II ORIENTATION from 03/07/2017 in [facility] CARDIAC REHAB  Date  03/07/17  Referring Provider  Rinaldo Cloud MD      Visit Diagnosis: 02/02/17 Stented coronary artery mid RCA  Patient's Home Medications on Admission:  Current Outpatient Prescriptions:  .  amiodarone (PACERONE) 200 MG tablet, Take 1 tablet (200 mg total) by mouth daily., Disp: 30 tablet, Rfl: 3 .  amLODipine (NORVASC) 5 MG tablet, Take 1 tablet (5 mg total) by mouth daily. (Patient taking differently: Take 5 mg by mouth 2 (two) times daily. ), Disp: 30 tablet, Rfl: 3 .  aspirin 81 MG tablet, Take 81 mg by mouth daily.  , Disp: , Rfl:  .  atorvastatin (LIPITOR) 80 MG tablet, Take 80 mg by mouth at bedtime., Disp: , Rfl:  .  clopidogrel (PLAVIX) 75 MG tablet, Take 75 mg by mouth daily., Disp: , Rfl: 3 .  dextromethorphan (DELSYM) 30 MG/5ML liquid, Take 60 mg by mouth 2 (two) times daily as needed for cough., Disp: , Rfl:  .  glipiZIDE (GLUCOTROL) 10 MG tablet, Take 10 mg by mouth 2 (two) times daily before a meal. Take 30 minutes before meals, Disp: , Rfl:  .  losartan (COZAAR) 50 MG tablet, Take 1 tablet (50 mg total) by mouth daily., Disp: 30 tablet, Rfl: 3 .  metoprolol succinate (TOPROL-XL) 25 MG 24 hr tablet, Take 25 mg by mouth daily., Disp: , Rfl: 3 .  nitroGLYCERIN (NITROSTAT) 0.4 MG SL tablet, Place 1 tablet (0.4 mg total) under the tongue every 5 (five) minutes x 3 doses as needed for chest pain., Disp: 25 tablet, Rfl: 3 .  Omega-3 Fatty Acids (FISH OIL) 1000 MG CAPS, Take 1,000 mg by mouth 2 (two) times daily., Disp: , Rfl:  .  psyllium (HYDROCIL/METAMUCIL) 95 % PACK,  Take 1 packet by mouth 2 (two) times daily. , Disp: , Rfl:   Past Medical History: Past Medical History:  Diagnosis Date  . Coronary artery disease   . Diabetes mellitus   . Diverticulitis   . Hypertension     Tobacco Use: History  Smoking Status  . Never Smoker  Smokeless Tobacco  . Never Used    Labs: Recent Review Flowsheet Data    Labs for ITP Cardiac and Pulmonary Rehab Latest Ref Rng & Units 03/16/2012 03/16/2012 03/17/2012 02/01/2017 02/02/2017   Cholestrol 0 - 200 mg/dL - - 409(W) - 119   LDLCALC 0 - 99 mg/dL - - 147(W) - 81   HDL >29 mg/dL - - 56(O) - 41   Trlycerides <150 mg/dL - - 130(Q) - 66   Hemoglobin A1c 4.8 - 5.6 % 8.5(H) 8.6(H) - 8.4(H) -      Capillary Blood Glucose: Lab Results  Component Value Date   GLUCAP 141 (H) 03/17/2017   GLUCAP 221 (H) 03/17/2017   GLUCAP 111 (H) 03/15/2017   GLUCAP 167 (H) 03/15/2017   GLUCAP 151 (H) 02/03/2017     Exercise Target Goals:    Exercise Program Goal: Individual exercise prescription set with THRR, safety & activity barriers. Participant demonstrates ability to understand and report RPE using BORG scale, to self-measure pulse  accurately, and to acknowledge the importance of the exercise prescription.  Exercise Prescription Goal: Starting with aerobic activity 30 plus minutes a day, 3 days per week for initial exercise prescription. Provide home exercise prescription and guidelines that participant acknowledges understanding prior to discharge.  Activity Barriers & Risk Stratification:     Activity Barriers & Cardiac Risk Stratification - 03/07/17 0851      Activity Barriers & Cardiac Risk Stratification   Activity Barriers History of Falls;Deconditioning;Muscular Weakness;Balance Concerns;Other (comment)   Comments dislodged or disclocated R shoulder, knee issues (B)   Cardiac Risk Stratification High      6 Minute Walk:     6 Minute Walk    Row Name 03/07/17 1131         6 Minute Walk   Phase  Initial     Distance 1635 feet     Walk Time 6 minutes     # of Rest Breaks 0     MPH 3.1     METS 3.2     RPE 9     VO2 Peak 11.3     Symptoms No     Resting HR 66 bpm     Resting BP 130/60     Resting Oxygen Saturation  98 %     Exercise Oxygen Saturation  during 6 min walk 98 %     Max Ex. HR 111 bpm     Max Ex. BP 164/60     2 Minute Post BP 120/60        Oxygen Initial Assessment:   Oxygen Re-Evaluation:   Oxygen Discharge (Final Oxygen Re-Evaluation):   Initial Exercise Prescription:     Initial Exercise Prescription - 03/07/17 1100      Date of Initial Exercise RX and Referring Provider   Date 03/07/17   Referring Provider Rinaldo Cloud MD     Bike   Level 0.07   Minutes 10   METs 2.65     NuStep   Level 3   SPM 80   Minutes 10   METs 2.4     Track   Laps 10   Minutes 10   METs 2.74     Prescription Details   Frequency (times per week) 3   Duration Progress to 30 minutes of continuous aerobic without signs/symptoms of physical distress     Intensity   THRR 40-80% of Max Heartrate 56-111   Ratings of Perceived Exertion 11-13   Perceived Dyspnea 0-4     Progression   Progression Continue to progress workloads to maintain intensity without signs/symptoms of physical distress.     Resistance Training   Training Prescription Yes   Weight 3lbs   Reps 10-15      Perform Capillary Blood Glucose checks as needed.  Exercise Prescription Changes:     Exercise Prescription Changes    Row Name 03/15/17 1635 03/20/17 1600           Response to Exercise   Blood Pressure (Admit) 154/62 156/74      Blood Pressure (Exercise) 144/60 128/70      Blood Pressure (Exit) 114/64 120/70      Heart Rate (Admit) 63 bpm 60 bpm      Heart Rate (Exercise) 92 bpm 91 bpm      Heart Rate (Exit) 64 bpm 68 bpm      Rating of Perceived Exertion (Exercise) 13 11      Symptoms none nonr      Comments  pt was oriented to exercise equipment. Pt did well with  first exercise session  -      Duration Continue with 45 min of aerobic exercise without signs/symptoms of physical distress. Continue with 30 min of aerobic exercise without signs/symptoms of physical distress.      Intensity THRR unchanged THRR unchanged        Progression   Progression Continue to progress workloads to maintain intensity without signs/symptoms of physical distress. Continue to progress workloads to maintain intensity without signs/symptoms of physical distress.      Average METs 2.6 3        Resistance Training   Training Prescription Yes Yes      Weight 3lbs 4lbs      Reps 10-15 10-15      Time 10 Minutes 10 Minutes        Bike   Level 0.7 0.7      Minutes 10 10      METs 2.67 2.66        NuStep   Level 3 3      SPM 85 85      Minutes 10 10      METs 2.7 2.6        Track   Laps 9 14      Minutes 10 10      METs 2.57 3.43         Exercise Comments:     Exercise Comments    Row Name 03/15/17 1637 03/17/17 1638 03/21/17 0829       Exercise Comments Pt oriented to exercise equipment today. Pt responded well to first exercise session; will continue to monitor pt's progress pt c/o low back from lifting small toddler yesterday. pt was able to exercise without difficulty. Pt is doing well and making great progess in cardiac rehab. Pt is able to exercise for 30 minutes without difficulty and denied LBP! at this time.        Exercise Goals and Review:     Exercise Goals    Row Name 03/07/17 [phone]             Exercise Goals   Increase Physical Activity Yes       Intervention Provide advice, education, support and counseling about physical activity/exercise needs.;Develop an individualized exercise prescription for aerobic and resistive training based on initial evaluation findings, risk stratification, comorbidities and participant's personal goals.       Expected Outcomes Achievement of increased cardiorespiratory fitness and enhanced flexibility,  muscular endurance and strength shown through measurements of functional capacity and personal statement of participant.       Increase Strength and Stamina Yes  get back to working on cars        Intervention Provide advice, education, support and counseling about physical activity/exercise needs.;Develop an individualized exercise prescription for aerobic and resistive training based on initial evaluation findings, risk stratification, comorbidities and participant's personal goals.       Expected Outcomes Achievement of increased cardiorespiratory fitness and enhanced flexibility, muscular endurance and strength shown through measurements of functional capacity and personal statement of participant.       Able to understand and use rate of perceived exertion (RPE) scale Yes       Intervention Provide education and explanation on how to use RPE scale       Expected Outcomes Short Term: Able to use RPE daily in rehab to express subjective intensity level;Long Term:  Able to use RPE to guide  intensity level when exercising independently       Understanding of Exercise Prescription Yes       Intervention Provide education, explanation, and written materials on patient's individual exercise prescription       Expected Outcomes Short Term: Able to explain program exercise prescription;Long Term: Able to explain home exercise prescription to exercise independently          Exercise Goals Re-Evaluation :     Exercise Goals Re-Evaluation    Row Name 03/21/17 0830             Exercise Goal Re-Evaluation   Exercise Goals Review Understanding of Exercise Prescription;Increase Physical Activity;Able to understand and use rate of perceived exertion (RPE) scale;Increase Strength and Stamina       Comments Pt is exercising for 30 minutes without difficulty and has a great understanding of exercise flow and prescription       Expected Outcomes Pt will continue to improve in aerobic fitness and  functional capacity.           Discharge Exercise Prescription (Final Exercise Prescription Changes):     Exercise Prescription Changes - 03/20/17 1600      Response to Exercise   Blood Pressure (Admit) 156/74   Blood Pressure (Exercise) 128/70   Blood Pressure (Exit) 120/70   Heart Rate (Admit) 60 bpm   Heart Rate (Exercise) 91 bpm   Heart Rate (Exit) 68 bpm   Rating of Perceived Exertion (Exercise) 11   Symptoms nonr   Duration Continue with 30 min of aerobic exercise without signs/symptoms of physical distress.   Intensity THRR unchanged     Progression   Progression Continue to progress workloads to maintain intensity without signs/symptoms of physical distress.   Average METs 3     Resistance Training   Training Prescription Yes   Weight 4lbs   Reps 10-15   Time 10 Minutes     Bike   Level 0.7   Minutes 10   METs 2.66     NuStep   Level 3   SPM 85   Minutes 10   METs 2.6     Track   Laps 14   Minutes 10   METs 3.43      Nutrition:  Target Goals: Understanding of nutrition guidelines, daily intake of sodium 1500mg , cholesterol 200mg , calories 30% from fat and 7% or less from saturated fats, daily to have 5 or more servings of fruits and vegetables.  Biometrics:     Pre Biometrics - 03/07/17 1135      Pre Biometrics   % Body Fat 29.2 %       Nutrition Therapy Plan and Nutrition Goals:     Nutrition Therapy & Goals - 03/07/17 1520      Nutrition Therapy   Diet Carb Modified, TLC     Personal Nutrition Goals   Nutrition Goal CBG concentrations in the normal range or as close to normal as is safely possible.     Intervention Plan   Intervention Prescribe, educate and counsel regarding individualized specific dietary modifications aiming towards targeted core components such as weight, hypertension, lipid management, diabetes, heart failure and other comorbidities.   Expected Outcomes Short Term Goal: Understand basic principles of dietary  content, such as calories, fat, sodium, cholesterol and nutrients.;Long Term Goal: Adherence to prescribed nutrition plan.      Nutrition Discharge: Nutrition Scores:     Nutrition Assessments - 03/07/17 1520      MEDFICTS Scores  Pre Score 12      Nutrition Goals Re-Evaluation:   Nutrition Goals Re-Evaluation:   Nutrition Goals Discharge (Final Nutrition Goals Re-Evaluation):   Psychosocial: Target Goals: Acknowledge presence or absence of significant depression and/or stress, maximize coping skills, provide positive support system. Participant is able to verbalize types and ability to use techniques and skills needed for reducing stress and depression.  Initial Review & Psychosocial Screening:     Initial Psych Review & Screening - 03/07/17 0953      Initial Review   Current issues with None Identified     Family Dynamics   Good Support System? Yes  spouse, family and friends   Comments upon brief assessment, no psychosocial needs identified, no interventions necessary      Barriers   Psychosocial barriers to participate in program There are no identifiable barriers or psychosocial needs.     Screening Interventions   Interventions Encouraged to exercise      Quality of Life Scores:     Quality of Life - 03/07/17 0821      Quality of Life Scores   Health/Function Pre 28.4 %   Socioeconomic Pre 30 %   Psych/Spiritual Pre 30 %   Family Pre 30 %   GLOBAL Pre 29.3 %      PHQ-9: Recent Review Flowsheet Data    Depression screen [facility] 2/9 03/15/2017 04/23/2012   Decreased Interest 0 0   Down, Depressed, Hopeless 0 0   PHQ - 2 Score 0 0     Interpretation of Total Score  Total Score Depression Severity:  1-4 = Minimal depression, 5-9 = Mild depression, 10-14 = Moderate depression, 15-19 = Moderately severe depression, 20-27 = Severe depression   Psychosocial Evaluation and Intervention:     Psychosocial Evaluation - 03/15/17 1301      Psychosocial  Evaluation & Interventions   Interventions Encouraged to exercise with the program and follow exercise prescription   Comments Pt is excited to be in cardiac rehab. Pt denies any stress or depression.  Pt feels supported by his wife.   Continue Psychosocial Services  Follow up required by staff      Psychosocial Re-Evaluation:     Psychosocial Re-Evaluation    Row Name 03/22/17 1118             Psychosocial Re-Evaluation   Current issues with None Identified       Comments no psychosocial needs identified,no interventions necessary        Expected Outcomes pt will exhibit positive outlook with good coping skills.        Interventions Encouraged to attend Cardiac Rehabilitation for the exercise       Continue Psychosocial Services  No Follow up required          Psychosocial Discharge (Final Psychosocial Re-Evaluation):     Psychosocial Re-Evaluation - 03/22/17 1118      Psychosocial Re-Evaluation   Current issues with None Identified   Comments no psychosocial needs identified,no interventions necessary    Expected Outcomes pt will exhibit positive outlook with good coping skills.    Interventions Encouraged to attend Cardiac Rehabilitation for the exercise   Continue Psychosocial Services  No Follow up required      Vocational Rehabilitation: Provide vocational rehab assistance to qualifying candidates.   Vocational Rehab Evaluation & Intervention:     Vocational Rehab - 03/07/17 [id number]      Initial Vocational Rehab Evaluation & Intervention   Assessment shows need for Vocational Rehabilitation  No      Education: Education Goals: Education classes will be provided on a weekly basis, covering required topics. Participant will state understanding/return demonstration of topics presented.  Learning Barriers/Preferences:     Learning Barriers/Preferences - 03/07/17 0851      Learning Barriers/Preferences   Learning Barriers Hearing;Sight;Reading   Learning  Preferences Video;Verbal Instruction;Written Material;Skilled Demonstration;Pictoral      Education Topics: Count Your Pulse:  -Group instruction provided by verbal instruction, demonstration, patient participation and written materials to support subject.  Instructors address importance of being able to find your pulse and how to count your pulse when at home without a heart monitor.  Patients get hands on experience counting their pulse with staff help and individually.   CARDIAC REHAB PHASE II EXERCISE from 03/22/2017 in [facility] CARDIAC REHAB  Date  03/17/17  Instruction Review Code  2- meets goals/outcomes      Heart Attack, Angina, and Risk Factor Modification:  -Group instruction provided by verbal instruction, video, and written materials to support subject.  Instructors address signs and symptoms of angina and heart attacks.    Also discuss risk factors for heart disease and how to make changes to improve heart health risk factors.   Functional Fitness:  -Group instruction provided by verbal instruction, demonstration, patient participation, and written materials to support subject.  Instructors address safety measures for doing things around the house.  Discuss how to get up and down off the floor, how to pick things up properly, how to safely get out of a chair without assistance, and balance training.   Meditation and Mindfulness:  -Group instruction provided by verbal instruction, patient participation, and written materials to support subject.  Instructor addresses importance of mindfulness and meditation practice to help reduce stress and improve awareness.  Instructor also leads participants through a meditation exercise.    Stretching for Flexibility and Mobility:  -Group instruction provided by verbal instruction, patient participation, and written materials to support subject.  Instructors lead participants through series of stretches that are designed  to increase flexibility thus improving mobility.  These stretches are additional exercise for major muscle groups that are typically performed during regular warm up and cool down.   Hands Only CPR:  -Group verbal, video, and participation provides a basic overview of AHA guidelines for community CPR. Role-play of emergencies allow participants the opportunity to practice calling for help and chest compression technique with discussion of AED use.   Hypertension: -Group verbal and written instruction that provides a basic overview of hypertension including the most recent diagnostic guidelines, risk factor reduction with self-care instructions and medication management.    Nutrition I class: Heart Healthy Eating:  -Group instruction provided by PowerPoint slides, verbal discussion, and written materials to support subject matter. The instructor gives an explanation and review of the Therapeutic Lifestyle Changes diet recommendations, which includes a discussion on lipid goals, dietary fat, sodium, fiber, plant stanol/sterol esters, sugar, and the components of a well-balanced, healthy diet.   Nutrition II class: Lifestyle Skills:  -Group instruction provided by PowerPoint slides, verbal discussion, and written materials to support subject matter. The instructor gives an explanation and review of label reading, grocery shopping for heart health, heart healthy recipe modifications, and ways to make healthier choices when eating out.   Diabetes Question & Answer:  -Group instruction provided by PowerPoint slides, verbal discussion, and written materials to support subject matter. The instructor gives an explanation and review of diabetes co-morbidities, pre- and post-prandial  blood glucose goals, pre-exercise blood glucose goals, signs, symptoms, and treatment of hypoglycemia and hyperglycemia, and foot care basics.   Diabetes Blitz:  -Group instruction provided by PowerPoint slides, verbal  discussion, and written materials to support subject matter. The instructor gives an explanation and review of the physiology behind type 1 and type 2 diabetes, diabetes medications and rational behind using different medications, pre- and post-prandial blood glucose recommendations and Hemoglobin A1c goals, diabetes diet, and exercise including blood glucose guidelines for exercising safely.    Portion Distortion:  -Group instruction provided by PowerPoint slides, verbal discussion, written materials, and food models to support subject matter. The instructor gives an explanation of serving size versus portion size, changes in portions sizes over the last 20 years, and what consists of a serving from each food group.   Stress Management:  -Group instruction provided by verbal instruction, video, and written materials to support subject matter.  Instructors review role of stress in heart disease and how to cope with stress positively.     Exercising on Your Own:  -Group instruction provided by verbal instruction, power point, and written materials to support subject.  Instructors discuss benefits of exercise, components of exercise, frequency and intensity of exercise, and end points for exercise.  Also discuss use of nitroglycerin and activating EMS.  Review options of places to exercise outside of rehab.  Review guidelines for sex with heart disease.   CARDIAC REHAB PHASE II EXERCISE from 03/22/2017 in [facility] CARDIAC REHAB  Date  03/15/17  Instruction Review Code  2- meets goals/outcomes      Cardiac Drugs I:  -Group instruction provided by verbal instruction and written materials to support subject.  Instructor reviews cardiac drug classes: antiplatelets, anticoagulants, beta blockers, and statins.  Instructor discusses reasons, side effects, and lifestyle considerations for each drug class.   CARDIAC REHAB PHASE II EXERCISE from 03/22/2017 in [facility]  CARDIAC REHAB  Date  03/22/17  Instruction Review Code  2- meets goals/outcomes      Cardiac Drugs II:  -Group instruction provided by verbal instruction and written materials to support subject.  Instructor reviews cardiac drug classes: angiotensin converting enzyme inhibitors (ACE-I), angiotensin II receptor blockers (ARBs), nitrates, and calcium channel blockers.  Instructor discusses reasons, side effects, and lifestyle considerations for each drug class.   Anatomy and Physiology of the Circulatory System:  Group verbal and written instruction and models provide basic cardiac anatomy and physiology, with the coronary electrical and arterial systems. Review of: AMI, Angina, Valve disease, Heart Failure, Peripheral Artery Disease, Cardiac Arrhythmia, Pacemakers, and the ICD.   Other Education:  -Group or individual verbal, written, or video instructions that support the educational goals of the cardiac rehab program.   Knowledge Questionnaire Score:     Knowledge Questionnaire Score - 03/07/17 0955      Knowledge Questionnaire Score   Pre Score 21/24      Core Components/Risk Factors/Patient Goals at Admission:     Personal Goals and Risk Factors at Admission - 03/07/17 1135      Core Components/Risk Factors/Patient Goals on Admission   Diabetes Yes   Intervention Provide education about proper nutrition, including hydration, and aerobic/resistive exercise prescription along with prescribed medications to achieve blood glucose in normal ranges: Fasting glucose 65-99 mg/dL;Provide education about signs/symptoms and action to take for hypo/hyperglycemia.   Expected Outcomes Short Term: Participant verbalizes understanding of the signs/symptoms and immediate care of hyper/hypoglycemia, proper foot care and importance of  medication, aerobic/resistive exercise and nutrition plan for blood glucose control.;Long Term: Attainment of HbA1C < 7%.   Hypertension Yes   Intervention  Provide education on lifestyle modifcations including regular physical activity/exercise, weight management, moderate sodium restriction and increased consumption of fresh fruit, vegetables, and low fat dairy, alcohol moderation, and smoking cessation.;Monitor prescription use compliance.   Expected Outcomes Short Term: Continued assessment and intervention until BP is < 140/64mm HG in hypertensive participants. < 130/79mm HG in hypertensive participants with diabetes, heart failure or chronic kidney disease.;Long Term: Maintenance of blood pressure at goal levels.   Lipids Yes   Intervention Provide education and support for participant on nutrition & aerobic/resistive exercise along with prescribed medications to achieve LDL 70mg , HDL >40mg .   Expected Outcomes Short Term: Participant states understanding of desired cholesterol values and is compliant with medications prescribed. Participant is following exercise prescription and nutrition guidelines.;Long Term: Cholesterol controlled with medications as prescribed, with individualized exercise RX and with personalized nutrition plan. Value goals: LDL < , HDL > 40 mg.      Core Components/Risk Factors/Patient Goals Review:      Goals and Risk Factor Review    Row Name 03/22/17 1116             Core Components/Risk Factors/Patient Goals Review   Personal Goals Review Diabetes;Hypertension;Lipids       Review pt with multiple CAD RF demonstrates eagerness to participate in CR exercise, nutrition and lifestyle modification education opportunities.        Expected Outcomes pt will participate in CR exercise, nutrition and education opportunities to decrease overall RF.           Core Components/Risk Factors/Patient Goals at Discharge (Final Review):      Goals and Risk Factor Review - 03/22/17 1116      Core Components/Risk Factors/Patient Goals Review   Personal Goals Review Diabetes;Hypertension;Lipids   Review pt with multiple  CAD RF demonstrates eagerness to participate in CR exercise, nutrition and lifestyle modification education opportunities.    Expected Outcomes pt will participate in CR exercise, nutrition and education opportunities to decrease overall RF.       ITP Comments:     ITP Comments    Row Name 03/07/17 [phone] 03/22/17 1115         ITP Comments Dr. Armanda Magic, Medical Director  Dr. Armanda Magic, Medical Director          Comments: Pt is making expected progress toward personal goals after completing 4 sessions. Recommend continued exercise and life style modification education including  stress management and relaxation techniques to decrease cardiac risk profile.

## 2017-03-23 MED FILL — NITROGLYCERIN 0.4 MG TAB SL: 0.4 | 20 days supply | Qty: 25 | Fill #2

## 2017-03-24 ENCOUNTER — Encounter (HOSPITAL_COMMUNITY): Payer: Non-veteran care

## 2017-03-27 ENCOUNTER — Encounter (HOSPITAL_COMMUNITY): Payer: Non-veteran care

## 2017-03-29 ENCOUNTER — Encounter (HOSPITAL_COMMUNITY)
Admission: RE | Admit: 2017-03-29 | Discharge: 2017-03-29 | Disposition: A | Discharge status: Home / Self Care | Payer: Non-veteran care | Source: Ambulatory Visit | Attending: Cardiology | Admitting: Cardiology

## 2017-03-29 DIAGNOSIS — R55 Syncope and collapse: Secondary | ICD-10-CM | POA: Diagnosis not present

## 2017-03-29 DIAGNOSIS — Z955 Presence of coronary angioplasty implant and graft: Secondary | ICD-10-CM

## 2017-03-31 ENCOUNTER — Encounter (HOSPITAL_COMMUNITY)
Admission: RE | Admit: 2017-03-31 | Discharge: 2017-03-31 | Disposition: A | Discharge status: Home / Self Care | Payer: Non-veteran care | Source: Ambulatory Visit | Attending: Cardiology | Admitting: Cardiology

## 2017-03-31 DIAGNOSIS — Z955 Presence of coronary angioplasty implant and graft: Secondary | ICD-10-CM

## 2017-03-31 DIAGNOSIS — R55 Syncope and collapse: Secondary | ICD-10-CM | POA: Diagnosis not present

## 2017-03-31 LAB — GLUCOSE, CAPILLARY: GLUCOSE-CAPILLARY: 151 mg/dL — AB (ref 65–99)

## 2017-03-31 NOTE — Progress Notes (Signed)
Daily Session Note  Patient Details  Name: Alexander Donaldson MRN: [id number] Date of Birth: 01/16/1934 Referring Provider:     [place] from 03/07/2017 in [place]  Referring Provider  Charolette Forward MD      Encounter Date: 03/31/2017  Check In:     Session Check In - 03/31/17 0803      Check-In   Location [facility]   Staff Present Dorna Bloom, MS, ACSM RCEP, Exercise Physiologist;Shaaron Golliday, RN, Deland Pretty, MS, ACSM CEP, Exercise Physiologist   Supervising physician immediately available to respond to emergencies [facility] immediately available   Physician(s) Dr. Wynelle Cleveland   Medication changes reported     No   Fall or balance concerns reported    No   Tobacco Cessation No Change   Warm-up and Cool-down Performed as group-led instruction   Resistance Training Performed No   VAD Patient? No     Pain Assessment   Currently in Pain? No/denies      Capillary Blood Glucose: No results found for this or any previous visit (from the past 24 hour(s)).    History  Smoking Status  . Never Smoker  Smokeless Tobacco  . Never Used    Goals Met:  Independence with exercise equipment  Goals Unmet:  Dizziness   Comments: pt c/o dizziness while walking track. BP:  120/70 sitting, 140/70 standing. CBG:  161  Pt reports he ate small breakfast.  Pt given banana, peanut butter crackers, and lemonade with relief of symptoms.   Pt instructed to add protein to his preexercise meal.     Andi Hence, RN, BSN, Cardiac Pulmonary Rehab    Dr. Fransico Him is Medical Director for Cardiac Rehab at [facility].

## 2017-04-03 ENCOUNTER — Encounter (HOSPITAL_COMMUNITY)
Admission: RE | Admit: 2017-04-03 | Discharge: 2017-04-03 | Disposition: A | Discharge status: Home / Self Care | Payer: Non-veteran care | Source: Ambulatory Visit | Attending: Cardiology | Admitting: Cardiology

## 2017-04-03 DIAGNOSIS — R55 Syncope and collapse: Secondary | ICD-10-CM | POA: Diagnosis not present

## 2017-04-03 DIAGNOSIS — Z955 Presence of coronary angioplasty implant and graft: Secondary | ICD-10-CM

## 2017-04-03 LAB — GLUCOSE, CAPILLARY: GLUCOSE-CAPILLARY: 237 mg/dL — AB (ref 65–99)

## 2017-04-03 NOTE — Progress Notes (Signed)
Alexander Donaldson 83 y.o. male DOB: [birth year] MRN: [id number]      Nutrition Note  Note Spoke with pt. Nutrition plan and survey reviewed with pt. Pt is following Step 2 of the Therapeutic Lifestyle Changes diet. Pt is diabetic. Pt checks fasting CBG's 1 time a day. Fasting CBG's reportedly 120 mg/dL this morning. Pt states "sometimes they are lower." Pre-exercise CBG's in EMR reviewed. Age-appropriate nutrition recommendations re: diet and DM discussed. Pt expressed understanding of the information reviewed. Pt aware of nutrition education classes offered and plans on attending nutrition classes.  Nutrition Diagnosis ? Food-and nutrition-related knowledge deficit related to lack of exposure to information as related to diagnosis of: ? CVD ? DM  Nutrition Intervention ?  Pt's individual nutrition plan reviewed with pt. ? Benefits of adopting Therapeutic Lifestyle Changes discussed when [employer] reviewed.    Nutrition Goal(s):  ? CBG concentrations in the normal range or as close to normal as is safely possible.  Plan:  Pt to attend nutrition classes ? Nutrition I ? Nutrition II ? Portion Distortion ? Diabetes Blitz ? Diabetes Q & A Will provide client-centered nutrition education as part of interdisciplinary care.   Monitor and evaluate progress toward nutrition goal with team.  Mickle Plumb, M.Ed, RD, LDN, CDE 04/03/2017 8:52 AM

## 2017-04-05 ENCOUNTER — Encounter (HOSPITAL_COMMUNITY)
Admission: RE | Admit: 2017-04-05 | Discharge: 2017-04-05 | Disposition: A | Discharge status: Home / Self Care | Payer: Non-veteran care | Source: Ambulatory Visit | Attending: Cardiology | Admitting: Cardiology

## 2017-04-05 DIAGNOSIS — R55 Syncope and collapse: Secondary | ICD-10-CM | POA: Diagnosis not present

## 2017-04-05 DIAGNOSIS — Z955 Presence of coronary angioplasty implant and graft: Secondary | ICD-10-CM

## 2017-04-07 ENCOUNTER — Encounter (HOSPITAL_COMMUNITY)
Admission: RE | Admit: 2017-04-07 | Discharge: 2017-04-07 | Disposition: A | Discharge status: Home / Self Care | Payer: Non-veteran care | Source: Ambulatory Visit | Attending: Cardiology | Admitting: Cardiology

## 2017-04-07 DIAGNOSIS — R55 Syncope and collapse: Secondary | ICD-10-CM | POA: Diagnosis not present

## 2017-04-07 DIAGNOSIS — Z955 Presence of coronary angioplasty implant and graft: Secondary | ICD-10-CM

## 2017-04-10 ENCOUNTER — Encounter (HOSPITAL_COMMUNITY)
Admission: RE | Admit: 2017-04-10 | Discharge: 2017-04-10 | Disposition: A | Discharge status: Home / Self Care | Payer: No Typology Code available for payment source | Source: Ambulatory Visit | Attending: Cardiology | Admitting: Cardiology

## 2017-04-10 DIAGNOSIS — R55 Syncope and collapse: Secondary | ICD-10-CM | POA: Insufficient documentation

## 2017-04-10 DIAGNOSIS — Z955 Presence of coronary angioplasty implant and graft: Secondary | ICD-10-CM | POA: Insufficient documentation

## 2017-04-10 LAB — GLUCOSE, CAPILLARY: Glucose-Capillary: 113 mg/dL — ABNORMAL HIGH (ref 65–99)

## 2017-04-10 NOTE — Progress Notes (Signed)
Reviewed home exercise with pt today.  [place] shopping [place] for exercise.  Discussed taking short breaks, or doing 2(15' bouts) and having an exercise partner (wife) for safety and balance concern. Reviewed THR, pulse, RPE, sign and symptoms, NTG use, and when to call 911 or MD.  Also discussed weather considerations and indoor options.  Pt voiced understanding.    Kaitlan [employer]

## 2017-04-12 ENCOUNTER — Encounter (HOSPITAL_COMMUNITY): Payer: No Typology Code available for payment source

## 2017-04-14 ENCOUNTER — Encounter (HOSPITAL_COMMUNITY)
Admission: RE | Admit: 2017-04-14 | Discharge: 2017-04-14 | Disposition: A | Discharge status: Home / Self Care | Payer: No Typology Code available for payment source | Source: Ambulatory Visit | Attending: Cardiology | Admitting: Cardiology

## 2017-04-14 DIAGNOSIS — R55 Syncope and collapse: Secondary | ICD-10-CM | POA: Diagnosis not present

## 2017-04-14 DIAGNOSIS — Z955 Presence of coronary angioplasty implant and graft: Secondary | ICD-10-CM

## 2017-04-17 ENCOUNTER — Encounter (HOSPITAL_COMMUNITY)
Admission: RE | Admit: 2017-04-17 | Discharge: 2017-04-17 | Disposition: A | Discharge status: Home / Self Care | Payer: No Typology Code available for payment source | Source: Ambulatory Visit | Attending: Cardiology | Admitting: Cardiology

## 2017-04-17 DIAGNOSIS — R55 Syncope and collapse: Secondary | ICD-10-CM | POA: Diagnosis not present

## 2017-04-17 DIAGNOSIS — Z955 Presence of coronary angioplasty implant and graft: Secondary | ICD-10-CM

## 2017-04-18 NOTE — Progress Notes (Signed)
Cardiac Individual Treatment Plan  Patient Details  Name: Alexander Donaldson MRN: [id number] Date of Birth: [birth year] Referring Provider:     CARDIAC REHAB PHASE II ORIENTATION from 03/07/2017 in [facility] CARDIAC REHAB  Referring Provider  Rinaldo Cloud MD      Initial Encounter Date:    CARDIAC REHAB PHASE II ORIENTATION from 03/07/2017 in [facility] CARDIAC REHAB  Date  03/07/17  Referring Provider  Rinaldo Cloud MD      Visit Diagnosis: 02/02/17 Stented coronary artery mid RCA  Patient's Home Medications on Admission:  Current Outpatient Prescriptions:  .  amiodarone (PACERONE) 200 MG tablet, Take 1 tablet (200 mg total) by mouth daily., Disp: 30 tablet, Rfl: 3 .  amLODipine (NORVASC) 5 MG tablet, Take 1 tablet (5 mg total) by mouth daily. (Patient taking differently: Take 5 mg by mouth 2 (two) times daily. ), Disp: 30 tablet, Rfl: 3 .  aspirin 81 MG tablet, Take 81 mg by mouth daily.  , Disp: , Rfl:  .  atorvastatin (LIPITOR) 80 MG tablet, Take 80 mg by mouth at bedtime., Disp: , Rfl:  .  clopidogrel (PLAVIX) 75 MG tablet, Take 75 mg by mouth daily., Disp: , Rfl: 3 .  dextromethorphan (DELSYM) 30 MG/5ML liquid, Take 60 mg by mouth 2 (two) times daily as needed for cough., Disp: , Rfl:  .  glipiZIDE (GLUCOTROL) 10 MG tablet, Take 10 mg by mouth 2 (two) times daily before a meal. Take 30 minutes before meals, Disp: , Rfl:  .  losartan (COZAAR) 50 MG tablet, Take 1 tablet (50 mg total) by mouth daily., Disp: 30 tablet, Rfl: 3 .  metoprolol succinate (TOPROL-XL) 25 MG 24 hr tablet, Take 25 mg by mouth daily., Disp: , Rfl: 3 .  nitroGLYCERIN (NITROSTAT) 0.4 MG SL tablet, Place 1 tablet (0.4 mg total) under the tongue every 5 (five) minutes x 3 doses as needed for chest pain., Disp: 25 tablet, Rfl: 3 .  Omega-3 Fatty Acids (FISH OIL) 1000 MG CAPS, Take 1,000 mg by mouth 2 (two) times daily., Disp: , Rfl:  .  psyllium (HYDROCIL/METAMUCIL) 95 % PACK,  Take 1 packet by mouth 2 (two) times daily. , Disp: , Rfl:   Past Medical History: Past Medical History:  Diagnosis Date  . Coronary artery disease   . Diabetes mellitus   . Diverticulitis   . Hypertension     Tobacco Use: History  Smoking Status  . Never Smoker  Smokeless Tobacco  . Never Used    Labs: Recent Review Flowsheet Data    Labs for ITP Cardiac and Pulmonary Rehab Latest Ref Rng & Units 03/16/2012 03/16/2012 03/17/2012 02/01/2017 02/02/2017   Cholestrol 0 - 200 mg/dL - - 409(W) - 119   LDLCALC 0 - 99 mg/dL - - 147(W) - 81   HDL >29 mg/dL - - 56(O) - 41   Trlycerides <150 mg/dL - - 130(Q) - 66   Hemoglobin A1c 4.8 - 5.6 % 8.5(H) 8.6(H) - 8.4(H) -      Capillary Blood Glucose: Lab Results  Component Value Date   GLUCAP 113 (H) 04/10/2017   GLUCAP 237 (H) 04/03/2017   GLUCAP 151 (H) 03/31/2017   GLUCAP 141 (H) 03/17/2017   GLUCAP 221 (H) 03/17/2017     Exercise Target Goals:    Exercise Program Goal: Individual exercise prescription set with THRR, safety & activity barriers. Participant demonstrates ability to understand and report RPE using BORG scale, to self-measure pulse  accurately, and to acknowledge the importance of the exercise prescription.  Exercise Prescription Goal: Starting with aerobic activity 30 plus minutes a day, 3 days per week for initial exercise prescription. Provide home exercise prescription and guidelines that participant acknowledges understanding prior to discharge.  Activity Barriers & Risk Stratification:     Activity Barriers & Cardiac Risk Stratification - 03/07/17 0851      Activity Barriers & Cardiac Risk Stratification   Activity Barriers History of Falls;Deconditioning;Muscular Weakness;Balance Concerns;Other (comment)   Comments dislodged or disclocated R shoulder, knee issues (B)   Cardiac Risk Stratification High      6 Minute Walk:     6 Minute Walk    Row Name 03/07/17 1131         6 Minute Walk   Phase  Initial     Distance 1635 feet     Walk Time 6 minutes     # of Rest Breaks 0     MPH 3.1     METS 3.2     RPE 9     VO2 Peak 11.3     Symptoms No     Resting HR 66 bpm     Resting BP 130/60     Resting Oxygen Saturation  98 %     Exercise Oxygen Saturation  during 6 min walk 98 %     Max Ex. HR 111 bpm     Max Ex. BP 164/60     2 Minute Post BP 120/60        Oxygen Initial Assessment:   Oxygen Re-Evaluation:   Oxygen Discharge (Final Oxygen Re-Evaluation):   Initial Exercise Prescription:     Initial Exercise Prescription - 03/07/17 1100      Date of Initial Exercise RX and Referring Provider   Date 03/07/17   Referring Provider Rinaldo Cloud MD     Bike   Level 0.07   Minutes 10   METs 2.65     NuStep   Level 3   SPM 80   Minutes 10   METs 2.4     Track   Laps 10   Minutes 10   METs 2.74     Prescription Details   Frequency (times per week) 3   Duration Progress to 30 minutes of continuous aerobic without signs/symptoms of physical distress     Intensity   THRR 40-80% of Max Heartrate 56-111   Ratings of Perceived Exertion 11-13   Perceived Dyspnea 0-4     Progression   Progression Continue to progress workloads to maintain intensity without signs/symptoms of physical distress.     Resistance Training   Training Prescription Yes   Weight 3lbs   Reps 10-15      Perform Capillary Blood Glucose checks as needed.  Exercise Prescription Changes:     Exercise Prescription Changes    Row Name 03/15/17 1635 03/20/17 1600 04/18/17 1300         Response to Exercise   Blood Pressure (Admit) 154/62 156/74 124/70     Blood Pressure (Exercise) 144/60 128/70 142/78     Blood Pressure (Exit) 114/64 120/70 118/60     Heart Rate (Admit) 63 bpm 60 bpm 57 bpm     Heart Rate (Exercise) 92 bpm 91 bpm 116 bpm     Heart Rate (Exit) 64 bpm 68 bpm 76 bpm     Rating of Perceived Exertion (Exercise) 13 11 11      Symptoms none nonr nonr  Comments pt  was oriented to exercise equipment. Pt did well with first exercise session  -  -     Duration Continue with 45 min of aerobic exercise without signs/symptoms of physical distress. Continue with 30 min of aerobic exercise without signs/symptoms of physical distress. Continue with 30 min of aerobic exercise without signs/symptoms of physical distress.     Intensity THRR unchanged THRR unchanged THRR unchanged       Progression   Progression Continue to progress workloads to maintain intensity without signs/symptoms of physical distress. Continue to progress workloads to maintain intensity without signs/symptoms of physical distress. Continue to progress workloads to maintain intensity without signs/symptoms of physical distress.     Average METs 2.6 3 4.3       Resistance Training   Training Prescription Yes Yes Yes     Weight 3lbs 4lbs 4lbs     Reps 10-15 10-15 10-15     Time 10 Minutes 10 Minutes 10 Minutes       Bike   Level 0.7 0.7 1.5     Minutes 10 10 10      METs 2.67 2.66 4.57       NuStep   Level 3 3 3      SPM 85 85 100     Minutes 10 10 10      METs 2.7 2.6 4.6       Track   Laps 9 14 16      Minutes 10 10 10      METs 2.57 3.43 3.79       Home Exercise Plan   Plans to continue exercise at  -  - Home (comment)  walking in neighborhood or shopping centers with wife     Frequency  -  - Add 2 additional days to program exercise sessions.     Initial Home Exercises Provided  -  - 04/10/17        Exercise Comments:     Exercise Comments    Row Name 03/15/17 1637 03/17/17 1638 03/21/17 0829 04/18/17 1348     Exercise Comments Pt oriented to exercise equipment today. Pt responded well to first exercise session; will continue to monitor pt's progress pt c/o low back from lifting small toddler yesterday. pt was able to exercise without difficulty. Pt is doing well and making great progess in cardiac rehab. Pt is able to exercise for 30 minutes without difficulty and denied  LBP! at this time. Reviewed METs and goals. Pt is tolerating exercise very well; pt has some balance concerns but is able to navigate through exercise stations very well.       Exercise Goals and Review:     Exercise Goals    Row Name 03/07/17 [phone]             Exercise Goals   Increase Physical Activity Yes       Intervention Provide advice, education, support and counseling about physical activity/exercise needs.;Develop an individualized exercise prescription for aerobic and resistive training based on initial evaluation findings, risk stratification, comorbidities and participant's personal goals.       Expected Outcomes Achievement of increased cardiorespiratory fitness and enhanced flexibility, muscular endurance and strength shown through measurements of functional capacity and personal statement of participant.       Increase Strength and Stamina Yes  get back to working on cars        Intervention Provide advice, education, support and counseling about physical activity/exercise needs.;Develop an individualized exercise prescription for aerobic  and resistive training based on initial evaluation findings, risk stratification, comorbidities and participant's personal goals.       Expected Outcomes Achievement of increased cardiorespiratory fitness and enhanced flexibility, muscular endurance and strength shown through measurements of functional capacity and personal statement of participant.       Able to understand and use rate of perceived exertion (RPE) scale Yes       Intervention Provide education and explanation on how to use RPE scale       Expected Outcomes Short Term: Able to use RPE daily in rehab to express subjective intensity level;Long Term:  Able to use RPE to guide intensity level when exercising independently       Understanding of Exercise Prescription Yes       Intervention Provide education, explanation, and written materials on patient's individual exercise  prescription       Expected Outcomes Short Term: Able to explain program exercise prescription;Long Term: Able to explain home exercise prescription to exercise independently          Exercise Goals Re-Evaluation :     Exercise Goals Re-Evaluation    Row Name 03/21/17 0830 04/10/17 1048 04/18/17 1348         Exercise Goal Re-Evaluation   Exercise Goals Review Understanding of Exercise Prescription;Increase Physical Activity;Able to understand and use rate of perceived exertion (RPE) scale;Increase Strength and Stamina Increase Physical Activity;Able to understand and use rate of perceived exertion (RPE) scale;Knowledge and understanding of Target Heart Rate Range (THRR);Understanding of Exercise Prescription;Increase Strength and Stamina;Able to check pulse independently Increase Physical Activity;Able to understand and use rate of perceived exertion (RPE) scale;Knowledge and understanding of Target Heart Rate Range (THRR);Understanding of Exercise Prescription;Increase Strength and Stamina;Able to check pulse independently     Comments Pt is exercising for 30 minutes without difficulty and has a great understanding of exercise flow and prescription Reviewed home exercise with pt today.  Pt plans to walk at shopping center/mall for exercise.  Discussed taking short breaks, or doing 2(15' bouts) and having an exercise partner (wife) for safety and balance concern. Reviewed THR, pulse, RPE, sign and symptoms, NTG use, and when to call 911 or MD.  Also discussed weather considerations and indoor options.  Pt voiced understanding. Reviewed home exercise with pt today.  Pt plans to walk at shopping center/mall for exercise.  Discussed taking short breaks, or doing 2(15' bouts) and having an exercise partner (wife) for safety and balance concern. Reviewed THR, pulse, RPE, sign and symptoms, NTG use, and when to call 911 or MD.  Also discussed weather considerations and indoor options.  Pt voiced  understanding.     Expected Outcomes Pt will continue to improve in aerobic fitness and functional capacity. Pt will continue to improve in aerobic fitness and functional capacity, and be compliant with HEP Pt will continue to improve in aerobic fitness and functional capacity, and be compliant with HEP         Discharge Exercise Prescription (Final Exercise Prescription Changes):     Exercise Prescription Changes - 04/18/17 1300      Response to Exercise   Blood Pressure (Admit) 124/70   Blood Pressure (Exercise) 142/78   Blood Pressure (Exit) 118/60   Heart Rate (Admit) 57 bpm   Heart Rate (Exercise) 116 bpm   Heart Rate (Exit) 76 bpm   Rating of Perceived Exertion (Exercise) 11   Symptoms nonr   Duration Continue with 30 min of aerobic exercise without signs/symptoms of physical distress.  Intensity THRR unchanged     Progression   Progression Continue to progress workloads to maintain intensity without signs/symptoms of physical distress.   Average METs 4.3     Resistance Training   Training Prescription Yes   Weight 4lbs   Reps 10-15   Time 10 Minutes     Bike   Level 1.5   Minutes 10   METs 4.57     NuStep   Level 3   SPM 100   Minutes 10   METs 4.6     Track   Laps 16   Minutes 10   METs 3.79     Home Exercise Plan   Plans to continue exercise at Home (comment)  walking in neighborhood or shopping centers with wife   Frequency Add 2 additional days to program exercise sessions.   Initial Home Exercises Provided 04/10/17      Nutrition:  Target Goals: Understanding of nutrition guidelines, daily intake of sodium 1500mg , cholesterol 200mg , calories 30% from fat and 7% or less from saturated fats, daily to have 5 or more servings of fruits and vegetables.  Biometrics:     Pre Biometrics - 03/07/17 1135      Pre Biometrics   % Body Fat 29.2 %       Nutrition Therapy Plan and Nutrition Goals:     Nutrition Therapy & Goals - 03/07/17 1520       Nutrition Therapy   Diet Carb Modified, TLC     Personal Nutrition Goals   Nutrition Goal CBG concentrations in the normal range or as close to normal as is safely possible.     Intervention Plan   Intervention Prescribe, educate and counsel regarding individualized specific dietary modifications aiming towards targeted core components such as weight, hypertension, lipid management, diabetes, heart failure and other comorbidities.   Expected Outcomes Short Term Goal: Understand basic principles of dietary content, such as calories, fat, sodium, cholesterol and nutrients.;Long Term Goal: Adherence to prescribed nutrition plan.      Nutrition Discharge: Nutrition Scores:     Nutrition Assessments - 03/07/17 1520      MEDFICTS Scores   Pre Score 12      Nutrition Goals Re-Evaluation:   Nutrition Goals Re-Evaluation:   Nutrition Goals Discharge (Final Nutrition Goals Re-Evaluation):   Psychosocial: Target Goals: Acknowledge presence or absence of significant depression and/or stress, maximize coping skills, provide positive support system. Participant is able to verbalize types and ability to use techniques and skills needed for reducing stress and depression.  Initial Review & Psychosocial Screening:     Initial Psych Review & Screening - 03/07/17 0953      Initial Review   Current issues with None Identified     Family Dynamics   Good Support System? Yes  spouse, family and friends   Comments upon brief assessment, no psychosocial needs identified, no interventions necessary      Barriers   Psychosocial barriers to participate in program There are no identifiable barriers or psychosocial needs.     Screening Interventions   Interventions Encouraged to exercise      Quality of Life Scores:     Quality of Life - 03/07/17 0821      Quality of Life Scores   Health/Function Pre 28.4 %   Socioeconomic Pre 30 %   Psych/Spiritual Pre 30 %   Family Pre 30 %    GLOBAL Pre 29.3 %      PHQ-9: Recent Review Flowsheet Data  Depression screen [facility] 2/9 03/15/2017 04/23/2012   Decreased Interest 0 0   Down, Depressed, Hopeless 0 0   PHQ - 2 Score 0 0     Interpretation of Total Score  Total Score Depression Severity:  1-4 = Minimal depression, 5-9 = Mild depression, 10-14 = Moderate depression, 15-19 = Moderately severe depression, 20-27 = Severe depression   Psychosocial Evaluation and Intervention:     Psychosocial Evaluation - 03/15/17 1301      Psychosocial Evaluation & Interventions   Interventions Encouraged to exercise with the program and follow exercise prescription   Comments Pt is excited to be in cardiac rehab. Pt denies any stress or depression.  Pt feels supported by his wife.   Continue Psychosocial Services  Follow up required by staff      Psychosocial Re-Evaluation:     Psychosocial Re-Evaluation    Row Name 03/22/17 1118 04/12/17 1119           Psychosocial Re-Evaluation   Current issues with None Identified None Identified      Comments no psychosocial needs identified,no interventions necessary  no psychosocial needs identified,no interventions necessary       Expected Outcomes pt will exhibit positive outlook with good coping skills.  pt will exhibit positive outlook with good coping skills.       Interventions Encouraged to attend Cardiac Rehabilitation for the exercise Encouraged to attend Cardiac Rehabilitation for the exercise      Continue Psychosocial Services  No Follow up required No Follow up required         Psychosocial Discharge (Final Psychosocial Re-Evaluation):     Psychosocial Re-Evaluation - 04/12/17 1119      Psychosocial Re-Evaluation   Current issues with None Identified   Comments no psychosocial needs identified,no interventions necessary    Expected Outcomes pt will exhibit positive outlook with good coping skills.    Interventions Encouraged to attend Cardiac Rehabilitation for  the exercise   Continue Psychosocial Services  No Follow up required      Vocational Rehabilitation: Provide vocational rehab assistance to qualifying candidates.   Vocational Rehab Evaluation & Intervention:     Vocational Rehab - 03/07/17 1610      Initial Vocational Rehab Evaluation & Intervention   Assessment shows need for Vocational Rehabilitation No      Education: Education Goals: Education classes will be provided on a weekly basis, covering required topics. Participant will state understanding/return demonstration of topics presented.  Learning Barriers/Preferences:     Learning Barriers/Preferences - 03/07/17 0851      Learning Barriers/Preferences   Learning Barriers Hearing;Sight;Reading   Learning Preferences Video;Verbal Instruction;Written Material;Skilled Demonstration;Pictoral      Education Topics: Count Your Pulse:  -Group instruction provided by verbal instruction, demonstration, patient participation and written materials to support subject.  Instructors address importance of being able to find your pulse and how to count your pulse when at home without a heart monitor.  Patients get hands on experience counting their pulse with staff help and individually.   CARDIAC REHAB PHASE II EXERCISE from 04/14/2017 in [facility] CARDIAC REHAB  Date  03/17/17  Instruction Review Code  2- meets goals/outcomes      Heart Attack, Angina, and Risk Factor Modification:  -Group instruction provided by verbal instruction, video, and written materials to support subject.  Instructors address signs and symptoms of angina and heart attacks.    Also discuss risk factors for heart disease and how to make changes to  improve heart health risk factors.   CARDIAC REHAB PHASE II EXERCISE from 04/14/2017 in [facility] CARDIAC REHAB  Date  03/29/17  Instruction Review Code  2- meets goals/outcomes      Functional Fitness:  -Group  instruction provided by verbal instruction, demonstration, patient participation, and written materials to support subject.  Instructors address safety measures for doing things around the house.  Discuss how to get up and down off the floor, how to pick things up properly, how to safely get out of a chair without assistance, and balance training.   CARDIAC REHAB PHASE II EXERCISE from 04/14/2017 in [facility] CARDIAC REHAB  Date  03/31/17  Instruction Review Code  2- meets goals/outcomes      Meditation and Mindfulness:  -Group instruction provided by verbal instruction, patient participation, and written materials to support subject.  Instructor addresses importance of mindfulness and meditation practice to help reduce stress and improve awareness.  Instructor also leads participants through a meditation exercise.    Stretching for Flexibility and Mobility:  -Group instruction provided by verbal instruction, patient participation, and written materials to support subject.  Instructors lead participants through series of stretches that are designed to increase flexibility thus improving mobility.  These stretches are additional exercise for major muscle groups that are typically performed during regular warm up and cool down.   CARDIAC REHAB PHASE II EXERCISE from 04/14/2017 in [facility] CARDIAC REHAB  Date  04/14/17  Instruction Review Code  2- meets goals/outcomes      Hands Only CPR:  -Group verbal, video, and participation provides a basic overview of AHA guidelines for community CPR. Role-play of emergencies allow participants the opportunity to practice calling for help and chest compression technique with discussion of AED use.   Hypertension: -Group verbal and written instruction that provides a basic overview of hypertension including the most recent diagnostic guidelines, risk factor reduction with self-care instructions and medication  management.   CARDIAC REHAB PHASE II EXERCISE from 04/14/2017 in [facility] CARDIAC REHAB  Date  04/07/17  Instruction Review Code  2- meets goals/outcomes       Nutrition I class: Heart Healthy Eating:  -Group instruction provided by PowerPoint slides, verbal discussion, and written materials to support subject matter. The instructor gives an explanation and review of the Therapeutic Lifestyle Changes diet recommendations, which includes a discussion on lipid goals, dietary fat, sodium, fiber, plant stanol/sterol esters, sugar, and the components of a well-balanced, healthy diet.   Nutrition II class: Lifestyle Skills:  -Group instruction provided by PowerPoint slides, verbal discussion, and written materials to support subject matter. The instructor gives an explanation and review of label reading, grocery shopping for heart health, heart healthy recipe modifications, and ways to make healthier choices when eating out.   Diabetes Question & Answer:  -Group instruction provided by PowerPoint slides, verbal discussion, and written materials to support subject matter. The instructor gives an explanation and review of diabetes co-morbidities, pre- and post-prandial blood glucose goals, pre-exercise blood glucose goals, signs, symptoms, and treatment of hypoglycemia and hyperglycemia, and foot care basics.   Diabetes Blitz:  -Group instruction provided by PowerPoint slides, verbal discussion, and written materials to support subject matter. The instructor gives an explanation and review of the physiology behind type 1 and type 2 diabetes, diabetes medications and rational behind using different medications, pre- and post-prandial blood glucose recommendations and Hemoglobin A1c goals, diabetes diet, and exercise including blood glucose guidelines  for exercising safely.    Portion Distortion:  -Group instruction provided by PowerPoint slides, verbal discussion, written  materials, and food models to support subject matter. The instructor gives an explanation of serving size versus portion size, changes in portions sizes over the last 20 years, and what consists of a serving from each food group.   Stress Management:  -Group instruction provided by verbal instruction, video, and written materials to support subject matter.  Instructors review role of stress in heart disease and how to cope with stress positively.     Exercising on Your Own:  -Group instruction provided by verbal instruction, power point, and written materials to support subject.  Instructors discuss benefits of exercise, components of exercise, frequency and intensity of exercise, and end points for exercise.  Also discuss use of nitroglycerin and activating EMS.  Review options of places to exercise outside of rehab.  Review guidelines for sex with heart disease.   CARDIAC REHAB PHASE II EXERCISE from 04/14/2017 in [facility] CARDIAC REHAB  Date  03/15/17  Instruction Review Code  2- meets goals/outcomes      Cardiac Drugs I:  -Group instruction provided by verbal instruction and written materials to support subject.  Instructor reviews cardiac drug classes: antiplatelets, anticoagulants, beta blockers, and statins.  Instructor discusses reasons, side effects, and lifestyle considerations for each drug class.   CARDIAC REHAB PHASE II EXERCISE from 04/14/2017 in [facility] CARDIAC REHAB  Date  03/22/17  Instruction Review Code  2- meets goals/outcomes      Cardiac Drugs II:  -Group instruction provided by verbal instruction and written materials to support subject.  Instructor reviews cardiac drug classes: angiotensin converting enzyme inhibitors (ACE-I), angiotensin II receptor blockers (ARBs), nitrates, and calcium channel blockers.  Instructor discusses reasons, side effects, and lifestyle considerations for each drug class.   Anatomy and Physiology of  the Circulatory System:  Group verbal and written instruction and models provide basic cardiac anatomy and physiology, with the coronary electrical and arterial systems. Review of: AMI, Angina, Valve disease, Heart Failure, Peripheral Artery Disease, Cardiac Arrhythmia, Pacemakers, and the ICD.   CARDIAC REHAB PHASE II EXERCISE from 04/14/2017 in [facility] CARDIAC REHAB  Date  04/05/17  Educator  RN  Instruction Review Code  2- meets goals/outcomes      Other Education:  -Group or individual verbal, written, or video instructions that support the educational goals of the cardiac rehab program.   Knowledge Questionnaire Score:     Knowledge Questionnaire Score - 03/07/17 0955      Knowledge Questionnaire Score   Pre Score 21/24      Core Components/Risk Factors/Patient Goals at Admission:     Personal Goals and Risk Factors at Admission - 03/07/17 1135      Core Components/Risk Factors/Patient Goals on Admission   Diabetes Yes   Intervention Provide education about proper nutrition, including hydration, and aerobic/resistive exercise prescription along with prescribed medications to achieve blood glucose in normal ranges: Fasting glucose 65-99 mg/dL;Provide education about signs/symptoms and action to take for hypo/hyperglycemia.   Expected Outcomes Short Term: Participant verbalizes understanding of the signs/symptoms and immediate care of hyper/hypoglycemia, proper foot care and importance of medication, aerobic/resistive exercise and nutrition plan for blood glucose control.;Long Term: Attainment of HbA1C < 7%.   Hypertension Yes   Intervention Provide education on lifestyle modifcations including regular physical activity/exercise, weight management, moderate sodium restriction and increased consumption of fresh fruit, vegetables, and low fat  dairy, alcohol moderation, and smoking cessation.;Monitor prescription use compliance.   Expected Outcomes Short Term:  Continued assessment and intervention until BP is < 140/29mm HG in hypertensive participants. < 130/85mm HG in hypertensive participants with diabetes, heart failure or chronic kidney disease.;Long Term: Maintenance of blood pressure at goal levels.   Lipids Yes   Intervention Provide education and support for participant on nutrition & aerobic/resistive exercise along with prescribed medications to achieve LDL 70mg , HDL >40mg .   Expected Outcomes Short Term: Participant states understanding of desired cholesterol values and is compliant with medications prescribed. Participant is following exercise prescription and nutrition guidelines.;Long Term: Cholesterol controlled with medications as prescribed, with individualized exercise RX and with personalized nutrition plan. Value goals: LDL < , HDL > 40 mg.      Core Components/Risk Factors/Patient Goals Review:      Goals and Risk Factor Review    Row Name 03/22/17 1116 04/12/17 1118           Core Components/Risk Factors/Patient Goals Review   Personal Goals Review Diabetes;Hypertension;Lipids Diabetes;Hypertension;Lipids      Review pt with multiple CAD RF demonstrates eagerness to participate in CR exercise, nutrition and lifestyle modification education opportunities.  pt with multiple CAD RF demonstrates eagerness to participate in CR exercise, nutrition and lifestyle modification education opportunities. BP and BS well managed at CR.        Expected Outcomes pt will participate in CR exercise, nutrition and education opportunities to decrease overall RF.  pt will participate in CR exercise, nutrition and education opportunities to decrease overall RF.          Core Components/Risk Factors/Patient Goals at Discharge (Final Review):      Goals and Risk Factor Review - 04/12/17 1118      Core Components/Risk Factors/Patient Goals Review   Personal Goals Review Diabetes;Hypertension;Lipids   Review pt with multiple CAD RF  demonstrates eagerness to participate in CR exercise, nutrition and lifestyle modification education opportunities. BP and BS well managed at CR.     Expected Outcomes pt will participate in CR exercise, nutrition and education opportunities to decrease overall RF.       ITP Comments:     ITP Comments    Row Name 03/07/17 [phone] 03/22/17 1115 04/18/17 1628       ITP Comments Dr. Armanda Magic, Medical Director  Dr. Armanda Magic, Medical Director  30day ITP review.  pt with good attendance and participation.         Comments:

## 2017-04-19 ENCOUNTER — Encounter (HOSPITAL_COMMUNITY): Payer: No Typology Code available for payment source

## 2017-04-19 ENCOUNTER — Telehealth (HOSPITAL_COMMUNITY): Payer: Self-pay | Admitting: Internal Medicine

## 2017-04-21 ENCOUNTER — Telehealth (HOSPITAL_COMMUNITY): Payer: Self-pay | Admitting: Internal Medicine

## 2017-04-21 ENCOUNTER — Encounter (HOSPITAL_COMMUNITY): Payer: No Typology Code available for payment source

## 2017-04-24 ENCOUNTER — Encounter (HOSPITAL_COMMUNITY)
Admission: RE | Admit: 2017-04-24 | Discharge: 2017-04-24 | Disposition: A | Discharge status: Home / Self Care | Payer: No Typology Code available for payment source | Source: Ambulatory Visit | Attending: Cardiology | Admitting: Cardiology

## 2017-04-24 DIAGNOSIS — Z955 Presence of coronary angioplasty implant and graft: Secondary | ICD-10-CM

## 2017-04-24 DIAGNOSIS — R55 Syncope and collapse: Secondary | ICD-10-CM | POA: Diagnosis not present
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
## 2017-04-26 ENCOUNTER — Encounter (HOSPITAL_COMMUNITY)
Admission: RE | Admit: 2017-04-26 | Discharge: 2017-04-26 | Disposition: A | Discharge status: Home / Self Care | Payer: No Typology Code available for payment source | Source: Ambulatory Visit | Attending: Cardiology | Admitting: Cardiology

## 2017-04-26 DIAGNOSIS — Z955 Presence of coronary angioplasty implant and graft: Secondary | ICD-10-CM

## 2017-04-26 DIAGNOSIS — R55 Syncope and collapse: Secondary | ICD-10-CM | POA: Diagnosis not present

## 2017-04-28 ENCOUNTER — Encounter (HOSPITAL_COMMUNITY)
Admission: RE | Admit: 2017-04-28 | Discharge: 2017-04-28 | Disposition: A | Discharge status: Home / Self Care | Payer: No Typology Code available for payment source | Source: Ambulatory Visit | Attending: Cardiology | Admitting: Cardiology

## 2017-04-28 DIAGNOSIS — R55 Syncope and collapse: Secondary | ICD-10-CM | POA: Diagnosis not present

## 2017-04-28 DIAGNOSIS — Z955 Presence of coronary angioplasty implant and graft: Secondary | ICD-10-CM

## 2017-04-28 LAB — GLUCOSE, CAPILLARY: Glucose-Capillary: 135 mg/dL — ABNORMAL HIGH (ref 65–99)

## 2017-05-01 ENCOUNTER — Encounter (HOSPITAL_COMMUNITY)
Admission: RE | Admit: 2017-05-01 | Discharge: 2017-05-01 | Disposition: A | Discharge status: Home / Self Care | Payer: No Typology Code available for payment source | Source: Ambulatory Visit | Attending: Cardiology | Admitting: Cardiology

## 2017-05-01 DIAGNOSIS — Z955 Presence of coronary angioplasty implant and graft: Secondary | ICD-10-CM

## 2017-05-01 DIAGNOSIS — R55 Syncope and collapse: Secondary | ICD-10-CM | POA: Diagnosis not present

## 2017-05-03 ENCOUNTER — Encounter (HOSPITAL_COMMUNITY)
Admission: RE | Admit: 2017-05-03 | Discharge: 2017-05-03 | Disposition: A | Discharge status: Home / Self Care | Payer: No Typology Code available for payment source | Source: Ambulatory Visit | Attending: Cardiology | Admitting: Cardiology

## 2017-05-03 DIAGNOSIS — Z955 Presence of coronary angioplasty implant and graft: Secondary | ICD-10-CM

## 2017-05-03 DIAGNOSIS — R55 Syncope and collapse: Secondary | ICD-10-CM | POA: Diagnosis not present

## 2017-05-05 ENCOUNTER — Encounter (HOSPITAL_COMMUNITY): Payer: No Typology Code available for payment source

## 2017-05-08 ENCOUNTER — Encounter (HOSPITAL_COMMUNITY): Payer: No Typology Code available for payment source

## 2017-05-10 ENCOUNTER — Encounter (HOSPITAL_COMMUNITY)
Admission: RE | Admit: 2017-05-10 | Discharge: 2017-05-10 | Disposition: A | Discharge status: Home / Self Care | Payer: No Typology Code available for payment source | Source: Ambulatory Visit | Attending: Cardiology | Admitting: Cardiology

## 2017-05-10 DIAGNOSIS — Z955 Presence of coronary angioplasty implant and graft: Secondary | ICD-10-CM

## 2017-05-10 DIAGNOSIS — R55 Syncope and collapse: Secondary | ICD-10-CM | POA: Diagnosis not present

## 2017-05-11 DIAGNOSIS — E119 Type 2 diabetes mellitus without complications: Secondary | ICD-10-CM | POA: Diagnosis not present

## 2017-05-11 DIAGNOSIS — E785 Hyperlipidemia, unspecified: Secondary | ICD-10-CM | POA: Diagnosis not present

## 2017-05-11 DIAGNOSIS — I10 Essential (primary) hypertension: Secondary | ICD-10-CM | POA: Diagnosis not present

## 2017-05-11 DIAGNOSIS — I252 Old myocardial infarction: Secondary | ICD-10-CM | POA: Diagnosis not present

## 2017-05-11 DIAGNOSIS — M1990 Unspecified osteoarthritis, unspecified site: Secondary | ICD-10-CM | POA: Diagnosis not present

## 2017-05-11 DIAGNOSIS — I255 Ischemic cardiomyopathy: Secondary | ICD-10-CM | POA: Diagnosis not present

## 2017-05-11 DIAGNOSIS — I2510 Atherosclerotic heart disease of native coronary artery without angina pectoris: Secondary | ICD-10-CM | POA: Diagnosis not present

## 2017-05-12 ENCOUNTER — Encounter (HOSPITAL_COMMUNITY): Payer: Non-veteran care

## 2017-05-12 NOTE — Progress Notes (Signed)
Cardiac Individual Treatment Plan  Patient Details  Name: Alexander Donaldson MRN: [id number] Date of Birth: 1934-02-14 Referring Provider:     CARDIAC REHAB PHASE II ORIENTATION from 03/07/2017 in [facility] CARDIAC REHAB  Referring Provider  Rinaldo Cloud MD      Initial Encounter Date:    CARDIAC REHAB PHASE II ORIENTATION from 03/07/2017 in [facility] CARDIAC REHAB  Date  03/07/17  Referring Provider  Rinaldo Cloud MD      Visit Diagnosis: 02/02/17 Stented coronary artery mid RCA  Patient's Home Medications on Admission:  Current Outpatient Prescriptions:  .  amiodarone (PACERONE) 200 MG tablet, Take 1 tablet (200 mg total) by mouth daily., Disp: 30 tablet, Rfl: 3 .  amLODipine (NORVASC) 5 MG tablet, Take 1 tablet (5 mg total) by mouth daily. (Patient taking differently: Take 5 mg by mouth 2 (two) times daily. ), Disp: 30 tablet, Rfl: 3 .  aspirin 81 MG tablet, Take 81 mg by mouth daily.  , Disp: , Rfl:  .  atorvastatin (LIPITOR) 80 MG tablet, Take 80 mg by mouth at bedtime., Disp: , Rfl:  .  clopidogrel (PLAVIX) 75 MG tablet, Take 75 mg by mouth daily., Disp: , Rfl: 3 .  dextromethorphan (DELSYM) 30 MG/5ML liquid, Take 60 mg by mouth 2 (two) times daily as needed for cough., Disp: , Rfl:  .  glipiZIDE (GLUCOTROL) 10 MG tablet, Take 10 mg by mouth 2 (two) times daily before a meal. Take 30 minutes before meals, Disp: , Rfl:  .  losartan (COZAAR) 50 MG tablet, Take 1 tablet (50 mg total) by mouth daily., Disp: 30 tablet, Rfl: 3 .  metoprolol succinate (TOPROL-XL) 25 MG 24 hr tablet, Take 25 mg by mouth daily., Disp: , Rfl: 3 .  nitroGLYCERIN (NITROSTAT) 0.4 MG SL tablet, Place 1 tablet (0.4 mg total) under the tongue every 5 (five) minutes x 3 doses as needed for chest pain., Disp: 25 tablet, Rfl: 3 .  Omega-3 Fatty Acids (FISH OIL) 1000 MG CAPS, Take 1,000 mg by mouth 2 (two) times daily., Disp: , Rfl:  .  psyllium (HYDROCIL/METAMUCIL) 95 % PACK,  Take 1 packet by mouth 2 (two) times daily. , Disp: , Rfl:   Past Medical History: Past Medical History:  Diagnosis Date  . Coronary artery disease   . Diabetes mellitus   . Diverticulitis   . Hypertension     Tobacco Use: History  Smoking Status  . Never Smoker  Smokeless Tobacco  . Never Used    Labs: Recent Review Flowsheet Data    Labs for ITP Cardiac and Pulmonary Rehab Latest Ref Rng & Units 03/16/2012 03/16/2012 03/17/2012 02/01/2017 02/02/2017   Cholestrol 0 - 200 mg/dL - - 409(W) - 119   LDLCALC 0 - 99 mg/dL - - 147(W) - 81   HDL >29 mg/dL - - 56(O) - 41   Trlycerides <150 mg/dL - - 130(Q) - 66   Hemoglobin A1c 4.8 - 5.6 % 8.5(H) 8.6(H) - 8.4(H) -      Capillary Blood Glucose: Lab Results  Component Value Date   GLUCAP 135 (H) 04/28/2017   GLUCAP 113 (H) 04/10/2017   GLUCAP 237 (H) 04/03/2017   GLUCAP 151 (H) 03/31/2017   GLUCAP 141 (H) 03/17/2017     Exercise Target Goals:    Exercise Program Goal: Individual exercise prescription set with THRR, safety & activity barriers. Participant demonstrates ability to understand and report RPE using BORG scale, to self-measure pulse  accurately, and to acknowledge the importance of the exercise prescription.  Exercise Prescription Goal: Starting with aerobic activity 30 plus minutes a day, 3 days per week for initial exercise prescription. Provide home exercise prescription and guidelines that participant acknowledges understanding prior to discharge.  Activity Barriers & Risk Stratification:     Activity Barriers & Cardiac Risk Stratification - 03/07/17 0851      Activity Barriers & Cardiac Risk Stratification   Activity Barriers History of Falls;Deconditioning;Muscular Weakness;Balance Concerns;Other (comment)   Comments dislodged or disclocated R shoulder, knee issues (B)   Cardiac Risk Stratification High      6 Minute Walk:     6 Minute Walk    Row Name 03/07/17 1131         6 Minute Walk   Phase  Initial     Distance 1635 feet     Walk Time 6 minutes     # of Rest Breaks 0     MPH 3.1     METS 3.2     RPE 9     VO2 Peak 11.3     Symptoms No     Resting HR 66 bpm     Resting BP 130/60     Resting Oxygen Saturation  98 %     Exercise Oxygen Saturation  during 6 min walk 98 %     Max Ex. HR 111 bpm     Max Ex. BP 164/60     2 Minute Post BP 120/60        Oxygen Initial Assessment:   Oxygen Re-Evaluation:   Oxygen Discharge (Final Oxygen Re-Evaluation):   Initial Exercise Prescription:     Initial Exercise Prescription - 03/07/17 1100      Date of Initial Exercise RX and Referring Provider   Date 03/07/17   Referring Provider Rinaldo Cloud MD     Bike   Level 0.07   Minutes 10   METs 2.65     NuStep   Level 3   SPM 80   Minutes 10   METs 2.4     Track   Laps 10   Minutes 10   METs 2.74     Prescription Details   Frequency (times per week) 3   Duration Progress to 30 minutes of continuous aerobic without signs/symptoms of physical distress     Intensity   THRR 40-80% of Max Heartrate 56-111   Ratings of Perceived Exertion 11-13   Perceived Dyspnea 0-4     Progression   Progression Continue to progress workloads to maintain intensity without signs/symptoms of physical distress.     Resistance Training   Training Prescription Yes   Weight 3lbs   Reps 10-15      Perform Capillary Blood Glucose checks as needed.  Exercise Prescription Changes:     Exercise Prescription Changes    Row Name 03/15/17 1635 03/20/17 1600 04/18/17 1300 05/01/17 1500       Response to Exercise   Blood Pressure (Admit) 154/62 156/74 124/70 144/70    Blood Pressure (Exercise) 144/60 128/70 142/78 152/82    Blood Pressure (Exit) 114/64 120/70 118/60 118/70    Heart Rate (Admit) 63 bpm 60 bpm 57 bpm 72 bpm    Heart Rate (Exercise) 92 bpm 91 bpm 116 bpm 105 bpm    Heart Rate (Exit) 64 bpm 68 bpm 76 bpm 82 bpm    Rating of Perceived Exertion (Exercise) 13 11  11 11     Symptoms none  nonr nonr none    Comments pt was oriented to exercise equipment. Pt did well with first exercise session  -  -  -    Duration Continue with 45 min of aerobic exercise without signs/symptoms of physical distress. Continue with 30 min of aerobic exercise without signs/symptoms of physical distress. Continue with 30 min of aerobic exercise without signs/symptoms of physical distress. Continue with 30 min of aerobic exercise without signs/symptoms of physical distress.    Intensity THRR unchanged THRR unchanged THRR unchanged THRR unchanged      Progression   Progression Continue to progress workloads to maintain intensity without signs/symptoms of physical distress. Continue to progress workloads to maintain intensity without signs/symptoms of physical distress. Continue to progress workloads to maintain intensity without signs/symptoms of physical distress. Continue to progress workloads to maintain intensity without signs/symptoms of physical distress.    Average METs 2.6 3 4.3 4      Resistance Training   Training Prescription Yes Yes Yes Yes    Weight 3lbs 4lbs 4lbs 4lbs    Reps 10-15 10-15 10-15 10-15    Time 10 Minutes 10 Minutes 10 Minutes 10 Minutes      Bike   Level 0.7 0.7 1.5 1.5    Minutes 10 10 10 10     METs 2.67 2.66 4.57 4.57      NuStep   Level 3 3 3 3     SPM 85 85 100 100    Minutes 10 10 10 10     METs 2.7 2.6 4.6 3.7      Track   Laps 9 14 16 16     Minutes 10 10 10 10     METs 2.57 3.43 3.79 3.79      Home Exercise Plan   Plans to continue exercise at  -  - Home (comment)  walking in neighborhood or shopping centers with wife Home (comment)  walking in neighborhood or shopping centers with wife    Frequency  -  - Add 2 additional days to program exercise sessions. Add 2 additional days to program exercise sessions.    Initial Home Exercises Provided  -  - 04/10/17 04/10/17       Exercise Comments:     Exercise Comments    Row Name  03/15/17 1637 03/17/17 1638 03/21/17 0829 04/18/17 1348 05/09/17 1554   Exercise Comments Pt oriented to exercise equipment today. Pt responded well to first exercise session; will continue to monitor pt's progress pt c/o low back from lifting small toddler yesterday. pt was able to exercise without difficulty. Pt is doing well and making great progess in cardiac rehab. Pt is able to exercise for 30 minutes without difficulty and denied LBP! at this time. Reviewed METs and goals. Pt is tolerating exercise very well; pt has some balance concerns but is able to navigate through exercise stations very well. Reviewed METs and goals. Pt is tolerating exercise very well; pt has some balance concerns but is able to navigate through exercise stations very well.      Exercise Goals and Review:     Exercise Goals    Row Name 03/07/17 [phone]             Exercise Goals   Increase Physical Activity Yes       Intervention Provide advice, education, support and counseling about physical activity/exercise needs.;Develop an individualized exercise prescription for aerobic and resistive training based on initial evaluation findings, risk stratification, comorbidities and participant's personal goals.  Expected Outcomes Achievement of increased cardiorespiratory fitness and enhanced flexibility, muscular endurance and strength shown through measurements of functional capacity and personal statement of participant.       Increase Strength and Stamina Yes  get back to working on cars        Intervention Provide advice, education, support and counseling about physical activity/exercise needs.;Develop an individualized exercise prescription for aerobic and resistive training based on initial evaluation findings, risk stratification, comorbidities and participant's personal goals.       Expected Outcomes Achievement of increased cardiorespiratory fitness and enhanced flexibility, muscular endurance and strength  shown through measurements of functional capacity and personal statement of participant.       Able to understand and use rate of perceived exertion (RPE) scale Yes       Intervention Provide education and explanation on how to use RPE scale       Expected Outcomes Short Term: Able to use RPE daily in rehab to express subjective intensity level;Long Term:  Able to use RPE to guide intensity level when exercising independently       Understanding of Exercise Prescription Yes       Intervention Provide education, explanation, and written materials on patient's individual exercise prescription       Expected Outcomes Short Term: Able to explain program exercise prescription;Long Term: Able to explain home exercise prescription to exercise independently          Exercise Goals Re-Evaluation :     Exercise Goals Re-Evaluation    Row Name 03/21/17 0830 04/10/17 1048 04/18/17 1348 05/09/17 1555       Exercise Goal Re-Evaluation   Exercise Goals Review Understanding of Exercise Prescription;Increase Physical Activity;Able to understand and use rate of perceived exertion (RPE) scale;Increase Strength and Stamina Increase Physical Activity;Able to understand and use rate of perceived exertion (RPE) scale;Knowledge and understanding of Target Heart Rate Range (THRR);Understanding of Exercise Prescription;Increase Strength and Stamina;Able to check pulse independently Increase Physical Activity;Able to understand and use rate of perceived exertion (RPE) scale;Knowledge and understanding of Target Heart Rate Range (THRR);Understanding of Exercise Prescription;Increase Strength and Stamina;Able to check pulse independently Increase Physical Activity;Able to understand and use rate of perceived exertion (RPE) scale;Knowledge and understanding of Target Heart Rate Range (THRR);Understanding of Exercise Prescription;Increase Strength and Stamina;Able to check pulse independently    Comments Pt is exercising for 30  minutes without difficulty and has a great understanding of exercise flow and prescription Reviewed home exercise with pt today.  Pt plans to walk at shopping center/mall for exercise.  Discussed taking short breaks, or doing 2(15' bouts) and having an exercise partner (wife) for safety and balance concern. Reviewed THR, pulse, RPE, sign and symptoms, NTG use, and when to call 911 or MD.  Also discussed weather considerations and indoor options.  Pt voiced understanding. Reviewed home exercise with pt today.  Pt plans to walk at shopping center/mall for exercise.  Discussed taking short breaks, or doing 2(15' bouts) and having an exercise partner (wife) for safety and balance concern. Reviewed THR, pulse, RPE, sign and symptoms, NTG use, and when to call 911 or MD.  Also discussed weather considerations and indoor options.  Pt voiced understanding. Pt is doing well with exercise program. Pt BP has been elevated at check in, with exercise and post exercise. Pt is compliant with HEP and is diligent about exercise program.     Expected Outcomes Pt will continue to improve in aerobic fitness and functional capacity. Pt will continue  to improve in aerobic fitness and functional capacity, and be compliant with HEP Pt will continue to improve in aerobic fitness and functional capacity, and be compliant with HEP Pt will continue to improve in aerobic fitness and functional capacity, and be compliant with HEP        Discharge Exercise Prescription (Final Exercise Prescription Changes):     Exercise Prescription Changes - 05/01/17 1500      Response to Exercise   Blood Pressure (Admit) 144/70   Blood Pressure (Exercise) 152/82   Blood Pressure (Exit) 118/70   Heart Rate (Admit) 72 bpm   Heart Rate (Exercise) 105 bpm   Heart Rate (Exit) 82 bpm   Rating of Perceived Exertion (Exercise) 11   Symptoms none   Duration Continue with 30 min of aerobic exercise without signs/symptoms of physical distress.    Intensity THRR unchanged     Progression   Progression Continue to progress workloads to maintain intensity without signs/symptoms of physical distress.   Average METs 4     Resistance Training   Training Prescription Yes   Weight 4lbs   Reps 10-15   Time 10 Minutes     Bike   Level 1.5   Minutes 10   METs 4.57     NuStep   Level 3   SPM 100   Minutes 10   METs 3.7     Track   Laps 16   Minutes 10   METs 3.79     Home Exercise Plan   Plans to continue exercise at Home (comment)  walking in neighborhood or shopping centers with wife   Frequency Add 2 additional days to program exercise sessions.   Initial Home Exercises Provided 04/10/17      Nutrition:  Target Goals: Understanding of nutrition guidelines, daily intake of sodium 1500mg , cholesterol 200mg , calories 30% from fat and 7% or less from saturated fats, daily to have 5 or more servings of fruits and vegetables.  Biometrics:     Pre Biometrics - 03/07/17 1135      Pre Biometrics   % Body Fat 29.2 %       Nutrition Therapy Plan and Nutrition Goals:     Nutrition Therapy & Goals - 03/07/17 1520      Nutrition Therapy   Diet Carb Modified, TLC     Personal Nutrition Goals   Nutrition Goal CBG concentrations in the normal range or as close to normal as is safely possible.     Intervention Plan   Intervention Prescribe, educate and counsel regarding individualized specific dietary modifications aiming towards targeted core components such as weight, hypertension, lipid management, diabetes, heart failure and other comorbidities.   Expected Outcomes Short Term Goal: Understand basic principles of dietary content, such as calories, fat, sodium, cholesterol and nutrients.;Long Term Goal: Adherence to prescribed nutrition plan.      Nutrition Discharge: Nutrition Scores:     Nutrition Assessments - 03/07/17 1520      MEDFICTS Scores   Pre Score 12      Nutrition Goals  Re-Evaluation:   Nutrition Goals Re-Evaluation:   Nutrition Goals Discharge (Final Nutrition Goals Re-Evaluation):   Psychosocial: Target Goals: Acknowledge presence or absence of significant depression and/or stress, maximize coping skills, provide positive support system. Participant is able to verbalize types and ability to use techniques and skills needed for reducing stress and depression.  Initial Review & Psychosocial Screening:     Initial Psych Review & Screening - 03/07/17 1017  Initial Review   Current issues with None Identified     Family Dynamics   Good Support System? Yes  spouse, family and friends   Comments upon brief assessment, no psychosocial needs identified, no interventions necessary      Barriers   Psychosocial barriers to participate in program There are no identifiable barriers or psychosocial needs.     Screening Interventions   Interventions Encouraged to exercise      Quality of Life Scores:     Quality of Life - 03/07/17 0821      Quality of Life Scores   Health/Function Pre 28.4 %   Socioeconomic Pre 30 %   Psych/Spiritual Pre 30 %   Family Pre 30 %   GLOBAL Pre 29.3 %      PHQ-9: Recent Review Flowsheet Data    Depression screen [facility] 2/9 03/15/2017 04/23/2012   Decreased Interest 0 0   Down, Depressed, Hopeless 0 0   PHQ - 2 Score 0 0     Interpretation of Total Score  Total Score Depression Severity:  1-4 = Minimal depression, 5-9 = Mild depression, 10-14 = Moderate depression, 15-19 = Moderately severe depression, 20-27 = Severe depression   Psychosocial Evaluation and Intervention:     Psychosocial Evaluation - 03/15/17 1301      Psychosocial Evaluation & Interventions   Interventions Encouraged to exercise with the program and follow exercise prescription   Comments Pt is excited to be in cardiac rehab. Pt denies any stress or depression.  Pt feels supported by his wife.   Continue Psychosocial Services  Follow  up required by staff      Psychosocial Re-Evaluation:     Psychosocial Re-Evaluation    Row Name 03/22/17 1118 04/12/17 1119 05/09/17 1434         Psychosocial Re-Evaluation   Current issues with None Identified None Identified None Identified     Comments no psychosocial needs identified,no interventions necessary  no psychosocial needs identified,no interventions necessary  no psychosocial needs identified,no interventions necessary      Expected Outcomes pt will exhibit positive outlook with good coping skills.  pt will exhibit positive outlook with good coping skills.  pt will exhibit positive outlook with good coping skills.      Interventions Encouraged to attend Cardiac Rehabilitation for the exercise Encouraged to attend Cardiac Rehabilitation for the exercise Encouraged to attend Cardiac Rehabilitation for the exercise     Continue Psychosocial Services  No Follow up required No Follow up required No Follow up required        Psychosocial Discharge (Final Psychosocial Re-Evaluation):     Psychosocial Re-Evaluation - 05/09/17 1434      Psychosocial Re-Evaluation   Current issues with None Identified   Comments no psychosocial needs identified,no interventions necessary    Expected Outcomes pt will exhibit positive outlook with good coping skills.    Interventions Encouraged to attend Cardiac Rehabilitation for the exercise   Continue Psychosocial Services  No Follow up required      Vocational Rehabilitation: Provide vocational rehab assistance to qualifying candidates.   Vocational Rehab Evaluation & Intervention:     Vocational Rehab - 03/07/17 1610      Initial Vocational Rehab Evaluation & Intervention   Assessment shows need for Vocational Rehabilitation No      Education: Education Goals: Education classes will be provided on a weekly basis, covering required topics. Participant will state understanding/return demonstration of topics  presented.  Learning Barriers/Preferences:  Learning Barriers/Preferences - 03/07/17 1610      Learning Barriers/Preferences   Learning Barriers Hearing;Sight;Reading   Learning Preferences Video;Verbal Instruction;Written Material;Skilled Demonstration;Pictoral      Education Topics: Count Your Pulse:  -Group instruction provided by verbal instruction, demonstration, patient participation and written materials to support subject.  Instructors address importance of being able to find your pulse and how to count your pulse when at home without a heart monitor.  Patients get hands on experience counting their pulse with staff help and individually.   CARDIAC REHAB PHASE II EXERCISE from 05/03/2017 in [facility] CARDIAC REHAB  Date  03/17/17  Instruction Review Code  2- meets goals/outcomes      Heart Attack, Angina, and Risk Factor Modification:  -Group instruction provided by verbal instruction, video, and written materials to support subject.  Instructors address signs and symptoms of angina and heart attacks.    Also discuss risk factors for heart disease and how to make changes to improve heart health risk factors.   CARDIAC REHAB PHASE II EXERCISE from 05/03/2017 in [facility] CARDIAC REHAB  Date  03/29/17  Instruction Review Code  2- meets goals/outcomes      Functional Fitness:  -Group instruction provided by verbal instruction, demonstration, patient participation, and written materials to support subject.  Instructors address safety measures for doing things around the house.  Discuss how to get up and down off the floor, how to pick things up properly, how to safely get out of a chair without assistance, and balance training.   CARDIAC REHAB PHASE II EXERCISE from 05/03/2017 in [facility] CARDIAC REHAB  Date  04/28/17  Instruction Review Code  2- meets goals/outcomes      Meditation and Mindfulness:  -Group  instruction provided by verbal instruction, patient participation, and written materials to support subject.  Instructor addresses importance of mindfulness and meditation practice to help reduce stress and improve awareness.  Instructor also leads participants through a meditation exercise.    Stretching for Flexibility and Mobility:  -Group instruction provided by verbal instruction, patient participation, and written materials to support subject.  Instructors lead participants through series of stretches that are designed to increase flexibility thus improving mobility.  These stretches are additional exercise for major muscle groups that are typically performed during regular warm up and cool down.   CARDIAC REHAB PHASE II EXERCISE from 05/03/2017 in [facility] CARDIAC REHAB  Date  05/03/17  Instruction Review Code  2- meets goals/outcomes      Hands Only CPR:  -Group verbal, video, and participation provides a basic overview of AHA guidelines for community CPR. Role-play of emergencies allow participants the opportunity to practice calling for help and chest compression technique with discussion of AED use.   Hypertension: -Group verbal and written instruction that provides a basic overview of hypertension including the most recent diagnostic guidelines, risk factor reduction with self-care instructions and medication management.   CARDIAC REHAB PHASE II EXERCISE from 05/03/2017 in [facility] CARDIAC REHAB  Date  04/07/17  Instruction Review Code  2- meets goals/outcomes       Nutrition I class: Heart Healthy Eating:  -Group instruction provided by PowerPoint slides, verbal discussion, and written materials to support subject matter. The instructor gives an explanation and review of the Therapeutic Lifestyle Changes diet recommendations, which includes a discussion on lipid goals, dietary fat, sodium, fiber, plant stanol/sterol esters, sugar, and the  components of a well-balanced,  healthy diet.   Nutrition II class: Lifestyle Skills:  -Group instruction provided by PowerPoint slides, verbal discussion, and written materials to support subject matter. The instructor gives an explanation and review of label reading, grocery shopping for heart health, heart healthy recipe modifications, and ways to make healthier choices when eating out.   Diabetes Question & Answer:  -Group instruction provided by PowerPoint slides, verbal discussion, and written materials to support subject matter. The instructor gives an explanation and review of diabetes co-morbidities, pre- and post-prandial blood glucose goals, pre-exercise blood glucose goals, signs, symptoms, and treatment of hypoglycemia and hyperglycemia, and foot care basics.   Diabetes Blitz:  -Group instruction provided by PowerPoint slides, verbal discussion, and written materials to support subject matter. The instructor gives an explanation and review of the physiology behind type 1 and type 2 diabetes, diabetes medications and rational behind using different medications, pre- and post-prandial blood glucose recommendations and Hemoglobin A1c goals, diabetes diet, and exercise including blood glucose guidelines for exercising safely.    Portion Distortion:  -Group instruction provided by PowerPoint slides, verbal discussion, written materials, and food models to support subject matter. The instructor gives an explanation of serving size versus portion size, changes in portions sizes over the last 20 years, and what consists of a serving from each food group.   CARDIAC REHAB PHASE II EXERCISE from 05/03/2017 in [facility] CARDIAC REHAB  Date  04/26/17  Educator  RD  Instruction Review Code  2- meets goals/outcomes      Stress Management:  -Group instruction provided by verbal instruction, video, and written materials to support subject matter.  Instructors review role of  stress in heart disease and how to cope with stress positively.     Exercising on Your Own:  -Group instruction provided by verbal instruction, power point, and written materials to support subject.  Instructors discuss benefits of exercise, components of exercise, frequency and intensity of exercise, and end points for exercise.  Also discuss use of nitroglycerin and activating EMS.  Review options of places to exercise outside of rehab.  Review guidelines for sex with heart disease.   CARDIAC REHAB PHASE II EXERCISE from 05/03/2017 in [facility] CARDIAC REHAB  Date  03/15/17  Instruction Review Code  2- meets goals/outcomes      Cardiac Drugs I:  -Group instruction provided by verbal instruction and written materials to support subject.  Instructor reviews cardiac drug classes: antiplatelets, anticoagulants, beta blockers, and statins.  Instructor discusses reasons, side effects, and lifestyle considerations for each drug class.   CARDIAC REHAB PHASE II EXERCISE from 05/03/2017 in [facility] CARDIAC REHAB  Date  03/22/17  Instruction Review Code  2- meets goals/outcomes      Cardiac Drugs II:  -Group instruction provided by verbal instruction and written materials to support subject.  Instructor reviews cardiac drug classes: angiotensin converting enzyme inhibitors (ACE-I), angiotensin II receptor blockers (ARBs), nitrates, and calcium channel blockers.  Instructor discusses reasons, side effects, and lifestyle considerations for each drug class.   Anatomy and Physiology of the Circulatory System:  Group verbal and written instruction and models provide basic cardiac anatomy and physiology, with the coronary electrical and arterial systems. Review of: AMI, Angina, Valve disease, Heart Failure, Peripheral Artery Disease, Cardiac Arrhythmia, Pacemakers, and the ICD.   CARDIAC REHAB PHASE II EXERCISE from 05/03/2017 in [facility] CARDIAC  REHAB  Date  04/05/17  Educator  RN  Instruction Review Code  2- meets goals/outcomes      Other Education:  -Group or individual verbal, written, or video instructions that support the educational goals of the cardiac rehab program.   Knowledge Questionnaire Score:     Knowledge Questionnaire Score - 03/07/17 0955      Knowledge Questionnaire Score   Pre Score 21/24      Core Components/Risk Factors/Patient Goals at Admission:     Personal Goals and Risk Factors at Admission - 03/07/17 1135      Core Components/Risk Factors/Patient Goals on Admission   Diabetes Yes   Intervention Provide education about proper nutrition, including hydration, and aerobic/resistive exercise prescription along with prescribed medications to achieve blood glucose in normal ranges: Fasting glucose 65-99 mg/dL;Provide education about signs/symptoms and action to take for hypo/hyperglycemia.   Expected Outcomes Short Term: Participant verbalizes understanding of the signs/symptoms and immediate care of hyper/hypoglycemia, proper foot care and importance of medication, aerobic/resistive exercise and nutrition plan for blood glucose control.;Long Term: Attainment of HbA1C < 7%.   Hypertension Yes   Intervention Provide education on lifestyle modifcations including regular physical activity/exercise, weight management, moderate sodium restriction and increased consumption of fresh fruit, vegetables, and low fat dairy, alcohol moderation, and smoking cessation.;Monitor prescription use compliance.   Expected Outcomes Short Term: Continued assessment and intervention until BP is < 140/66mm HG in hypertensive participants. < 130/60mm HG in hypertensive participants with diabetes, heart failure or chronic kidney disease.;Long Term: Maintenance of blood pressure at goal levels.   Lipids Yes   Intervention Provide education and support for participant on nutrition & aerobic/resistive exercise along with prescribed  medications to achieve LDL 70mg , HDL >40mg .   Expected Outcomes Short Term: Participant states understanding of desired cholesterol values and is compliant with medications prescribed. Participant is following exercise prescription and nutrition guidelines.;Long Term: Cholesterol controlled with medications as prescribed, with individualized exercise RX and with personalized nutrition plan. Value goals: LDL < 70mg , HDL > 40 mg.      Core Components/Risk Factors/Patient Goals Review:      Goals and Risk Factor Review    Row Name 03/22/17 1116 04/12/17 1118 05/09/17 1433         Core Components/Risk Factors/Patient Goals Review   Personal Goals Review Diabetes;Hypertension;Lipids Diabetes;Hypertension;Lipids Diabetes;Hypertension;Lipids     Review pt with multiple CAD RF demonstrates eagerness to participate in CR exercise, nutrition and lifestyle modification education opportunities.  pt with multiple CAD RF demonstrates eagerness to participate in CR exercise, nutrition and lifestyle modification education opportunities. BP and BS well managed at CR.   pt with multiple CAD RF demonstrates eagerness to participate in CR exercise, nutrition and lifestyle modification education opportunities. Blood pressure increasing.  Dr. Sharyn Lull made aware of VS trends.  blood sugar  well managed at CR.       Expected Outcomes pt will participate in CR exercise, nutrition and education opportunities to decrease overall RF.  pt will participate in CR exercise, nutrition and education opportunities to decrease overall RF.  pt will participate in CR exercise, nutrition and education opportunities to decrease overall RF.         Core Components/Risk Factors/Patient Goals at Discharge (Final Review):      Goals and Risk Factor Review - 05/09/17 1433      Core Components/Risk Factors/Patient Goals Review   Personal Goals Review Diabetes;Hypertension;Lipids   Review pt with multiple CAD RF demonstrates eagerness  to participate in CR exercise, nutrition and lifestyle modification education opportunities. Blood pressure increasing.  Dr.  Harwani made aware of VS trends.  blood sugar  well managed at CR.     Expected Outcomes pt will participate in CR exercise, nutrition and education opportunities to decrease overall RF.       ITP Comments:     ITP Comments    Row Name 03/07/17 9604 03/22/17 1115 04/18/17 1628 05/09/17 1433     ITP Comments Dr. Armanda Magic, Medical Director  Dr. Armanda Magic, Medical Director  30day ITP review.  pt with good attendance and participation.  30day ITP review.  pt with good attendance and participation. will continue current regimen unless otherwise directed by Medical Director       Comments:

## 2017-05-15 ENCOUNTER — Encounter (HOSPITAL_COMMUNITY)
Admission: RE | Admit: 2017-05-15 | Discharge: 2017-05-15 | Disposition: A | Discharge status: Home / Self Care | Payer: Non-veteran care | Source: Ambulatory Visit | Attending: Cardiology | Admitting: Cardiology

## 2017-05-15 DIAGNOSIS — Z955 Presence of coronary angioplasty implant and graft: Secondary | ICD-10-CM | POA: Insufficient documentation

## 2017-05-15 DIAGNOSIS — R55 Syncope and collapse: Secondary | ICD-10-CM | POA: Diagnosis not present

## 2017-05-17 ENCOUNTER — Encounter (HOSPITAL_COMMUNITY)
Admission: RE | Admit: 2017-05-17 | Discharge: 2017-05-17 | Disposition: A | Discharge status: Home / Self Care | Payer: Non-veteran care | Source: Ambulatory Visit | Attending: Cardiology | Admitting: Cardiology

## 2017-05-17 DIAGNOSIS — Z955 Presence of coronary angioplasty implant and graft: Secondary | ICD-10-CM

## 2017-05-17 DIAGNOSIS — R55 Syncope and collapse: Secondary | ICD-10-CM | POA: Diagnosis not present

## 2017-05-19 ENCOUNTER — Encounter (HOSPITAL_COMMUNITY)
Admission: RE | Admit: 2017-05-19 | Discharge: 2017-05-19 | Disposition: A | Discharge status: Home / Self Care | Payer: Non-veteran care | Source: Ambulatory Visit | Attending: Cardiology | Admitting: Cardiology

## 2017-05-19 DIAGNOSIS — Z955 Presence of coronary angioplasty implant and graft: Secondary | ICD-10-CM

## 2017-05-19 DIAGNOSIS — R55 Syncope and collapse: Secondary | ICD-10-CM | POA: Diagnosis not present

## 2017-05-22 ENCOUNTER — Encounter (HOSPITAL_COMMUNITY)
Admission: RE | Admit: 2017-05-22 | Discharge: 2017-05-22 | Disposition: A | Discharge status: Home / Self Care | Payer: Non-veteran care | Source: Ambulatory Visit | Attending: Cardiology | Admitting: Cardiology

## 2017-05-22 DIAGNOSIS — Z955 Presence of coronary angioplasty implant and graft: Secondary | ICD-10-CM

## 2017-05-22 DIAGNOSIS — R55 Syncope and collapse: Secondary | ICD-10-CM | POA: Diagnosis not present

## 2017-05-24 ENCOUNTER — Encounter (HOSPITAL_COMMUNITY)
Admission: RE | Admit: 2017-05-24 | Discharge: 2017-05-24 | Disposition: A | Discharge status: Home / Self Care | Payer: Non-veteran care | Source: Ambulatory Visit | Attending: Cardiology | Admitting: Cardiology

## 2017-05-24 DIAGNOSIS — Z955 Presence of coronary angioplasty implant and graft: Secondary | ICD-10-CM

## 2017-05-24 DIAGNOSIS — R55 Syncope and collapse: Secondary | ICD-10-CM | POA: Diagnosis not present

## 2017-05-26 ENCOUNTER — Encounter (HOSPITAL_COMMUNITY): Payer: Non-veteran care

## 2017-05-29 ENCOUNTER — Encounter (HOSPITAL_COMMUNITY)
Admission: RE | Admit: 2017-05-29 | Discharge: 2017-05-29 | Disposition: A | Discharge status: Home / Self Care | Payer: Non-veteran care | Source: Ambulatory Visit | Attending: Cardiology | Admitting: Cardiology

## 2017-05-29 DIAGNOSIS — Z955 Presence of coronary angioplasty implant and graft: Secondary | ICD-10-CM

## 2017-05-29 DIAGNOSIS — R55 Syncope and collapse: Secondary | ICD-10-CM | POA: Diagnosis not present

## 2017-05-31 ENCOUNTER — Encounter (HOSPITAL_COMMUNITY)
Admission: RE | Admit: 2017-05-31 | Discharge: 2017-05-31 | Disposition: A | Discharge status: Home / Self Care | Payer: Non-veteran care | Source: Ambulatory Visit | Attending: Cardiology | Admitting: Cardiology

## 2017-05-31 DIAGNOSIS — Z955 Presence of coronary angioplasty implant and graft: Secondary | ICD-10-CM

## 2017-05-31 DIAGNOSIS — R55 Syncope and collapse: Secondary | ICD-10-CM | POA: Diagnosis not present

## 2017-06-02 ENCOUNTER — Encounter (HOSPITAL_COMMUNITY): Payer: Non-veteran care

## 2017-06-05 ENCOUNTER — Encounter (HOSPITAL_COMMUNITY): Payer: Non-veteran care

## 2017-06-05 MED FILL — AMIODARONE HCL 200 MG TAB: 200 | 90 days supply | Qty: 90 | Fill #0

## 2017-06-05 MED FILL — CLOPIDOGREL 75 MG TABLET: 75 | 90 days supply | Qty: 90 | Fill #1

## 2017-06-07 ENCOUNTER — Encounter (HOSPITAL_COMMUNITY)
Admission: RE | Admit: 2017-06-07 | Discharge: 2017-06-07 | Disposition: A | Discharge status: Home / Self Care | Payer: Non-veteran care | Source: Ambulatory Visit | Attending: Cardiology | Admitting: Cardiology

## 2017-06-07 DIAGNOSIS — Z955 Presence of coronary angioplasty implant and graft: Secondary | ICD-10-CM

## 2017-06-07 DIAGNOSIS — R55 Syncope and collapse: Secondary | ICD-10-CM | POA: Diagnosis not present

## 2017-06-09 ENCOUNTER — Encounter (HOSPITAL_COMMUNITY)
Admission: RE | Admit: 2017-06-09 | Discharge: 2017-06-09 | Disposition: A | Discharge status: Home / Self Care | Payer: Non-veteran care | Source: Ambulatory Visit | Attending: Cardiology | Admitting: Cardiology

## 2017-06-09 DIAGNOSIS — R55 Syncope and collapse: Secondary | ICD-10-CM | POA: Diagnosis not present

## 2017-06-09 DIAGNOSIS — Z955 Presence of coronary angioplasty implant and graft: Secondary | ICD-10-CM

## 2017-06-12 ENCOUNTER — Encounter (HOSPITAL_COMMUNITY): Payer: Non-veteran care

## 2017-06-14 ENCOUNTER — Encounter (HOSPITAL_COMMUNITY): Payer: Non-veteran care

## 2017-06-16 ENCOUNTER — Encounter (HOSPITAL_COMMUNITY)
Admission: RE | Admit: 2017-06-16 | Discharge: 2017-06-16 | Disposition: A | Discharge status: Home / Self Care | Payer: Non-veteran care | Source: Ambulatory Visit | Attending: Cardiology | Admitting: Cardiology

## 2017-06-16 DIAGNOSIS — Z955 Presence of coronary angioplasty implant and graft: Secondary | ICD-10-CM | POA: Diagnosis present

## 2017-06-16 DIAGNOSIS — R55 Syncope and collapse: Secondary | ICD-10-CM | POA: Insufficient documentation

## 2017-06-26 ENCOUNTER — Encounter (HOSPITAL_COMMUNITY)
Admission: RE | Admit: 2017-06-26 | Discharge: 2017-06-26 | Disposition: A | Discharge status: Home / Self Care | Payer: Non-veteran care | Source: Ambulatory Visit | Attending: Cardiology | Admitting: Cardiology

## 2017-06-26 VITALS — Ht 66.5 in | Wt 174.8 lb

## 2017-06-26 DIAGNOSIS — Z955 Presence of coronary angioplasty implant and graft: Secondary | ICD-10-CM

## 2017-06-26 DIAGNOSIS — R55 Syncope and collapse: Secondary | ICD-10-CM | POA: Diagnosis not present

## 2017-06-27 ENCOUNTER — Encounter (HOSPITAL_COMMUNITY): Payer: Self-pay

## 2017-06-28 ENCOUNTER — Encounter (HOSPITAL_COMMUNITY)
Admission: RE | Admit: 2017-06-28 | Discharge: 2017-06-28 | Disposition: A | Discharge status: Home / Self Care | Payer: Non-veteran care | Source: Ambulatory Visit | Attending: Cardiology | Admitting: Cardiology

## 2017-06-28 DIAGNOSIS — Z955 Presence of coronary angioplasty implant and graft: Secondary | ICD-10-CM

## 2017-06-28 DIAGNOSIS — R55 Syncope and collapse: Secondary | ICD-10-CM | POA: Diagnosis not present

## 2017-06-30 ENCOUNTER — Encounter (HOSPITAL_COMMUNITY)
Admission: RE | Admit: 2017-06-30 | Discharge: 2017-06-30 | Disposition: A | Discharge status: Home / Self Care | Payer: Non-veteran care | Source: Ambulatory Visit | Attending: Cardiology | Admitting: Cardiology

## 2017-06-30 DIAGNOSIS — R55 Syncope and collapse: Secondary | ICD-10-CM | POA: Diagnosis not present

## 2017-06-30 DIAGNOSIS — Z955 Presence of coronary angioplasty implant and graft: Secondary | ICD-10-CM

## 2017-07-21 NOTE — Addendum Note (Signed)
Encounter addended by: Jacques EarthlyFranko, Blanton Kardell Brewbaker, RD on: 07/21/2017 2:12 PM  Actions taken: Flowsheet data copied forward, Visit Navigator Flowsheet section accepted

## 2017-08-01 NOTE — Addendum Note (Signed)
Encounter addended by: Robyne Peersion, Tarron Krolak H, RN on: 08/01/2017 2:49 PM  Actions taken: Episode resolved

## 2017-08-01 NOTE — Addendum Note (Signed)
Encounter addended by: Robyne Peersion, Makenzie Vittorio H, RN on: 08/01/2017 2:46 PM  Actions taken: Sign clinical note

## 2017-08-01 NOTE — Progress Notes (Signed)
Discharge Progress Report  Patient Details  Name: Alexander Donaldson MRN: [id number] Date of Birth: 02-01-1934 Referring Provider:     CARDIAC REHAB PHASE II ORIENTATION from 03/07/2017 in [facility] CARDIAC [facility]  Referring Provider  Rinaldo Cloud MD       Number of Visits: 32   Reason for Discharge:  Pt reached stable level of exercise.   Smoking History:  Social History   Tobacco Use  Smoking Status Never Smoker  Smokeless Tobacco Never Used    Diagnosis:  02/02/17 Stented coronary artery mid RCA  ADL UCSD:   Initial Exercise Prescription: Initial Exercise Prescription - 03/07/17 1100      Date of Initial Exercise RX and Referring Provider   Date  03/07/17    Referring Provider  Rinaldo Cloud MD      Bike   Level  0.07    Minutes  10    METs  2.65      NuStep   Level  3    SPM  80    Minutes  10    METs  2.4      Track   Laps  10    Minutes  10    METs  2.74      Prescription Details   Frequency (times per week)  3    Duration  Progress to 30 minutes of continuous aerobic without signs/symptoms of physical distress      Intensity   THRR 40-80% of Max Heartrate  56-111    Ratings of Perceived Exertion  11-13    Perceived Dyspnea  0-4      Progression   Progression  Continue to progress workloads to maintain intensity without signs/symptoms of physical distress.      Resistance Training   Training Prescription  Yes    Weight  3lbs    Reps  10-15       Discharge Exercise Prescription (Final Exercise Prescription Changes): Exercise Prescription Changes - 07/19/17 1500      Response to Exercise   Blood Pressure (Admit)  130/78    Blood Pressure (Exercise)  142/80    Blood Pressure (Exit)  120/70    Heart Rate (Admit)  73 bpm    Heart Rate (Exercise)  105 bpm    Heart Rate (Exit)  70 bpm    Rating of Perceived Exertion (Exercise)  12    Symptoms  none    Duration  Continue with 30 min of aerobic exercise without  signs/symptoms of physical distress.    Intensity  THRR unchanged      Progression   Progression  Continue to progress workloads to maintain intensity without signs/symptoms of physical distress.    Average METs  4.2      Resistance Training   Training Prescription  Yes    Weight  3lbs    Reps  10-15    Time  10 Minutes      Bike   Level  1.5    Minutes  10    METs  4.57      NuStep   Level  5    SPM  100    Minutes  10    METs  4.1      Track   Laps  16    Minutes  10    METs  3.79      Home Exercise Plan   Plans to continue exercise at  Home (comment) walking in neighborhood or  shopping centers with wife    Frequency  Add 2 additional days to program exercise sessions.    Initial Home Exercises Provided  04/10/17       Functional Capacity: 6 Minute Walk    Row Name 03/07/17 1131 06/26/17 1029       6 Minute Walk   Phase  Initial  Discharge    Distance  1635 feet  1828 feet    Distance % Change  -  11.8 %    Distance Feet Change  -  193 ft    Walk Time  6 minutes  6 minutes    # of Rest Breaks  0  0    MPH  3.1  3.5    METS  3.2  3.61    RPE  9  11    VO2 Peak  11.3  12.65    Symptoms  No  No    Resting HR  66 bpm  72 bpm    Resting BP  130/60  154/60    Resting Oxygen Saturation   98 %  -    Exercise Oxygen Saturation  during 6 min walk  98 %  -    Max Ex. HR  111 bpm  113 bpm    Max Ex. BP  164/60  166/60    2 Minute Post BP  120/60  124/58       Psychological, QOL, Others - Outcomes: PHQ 2/9: Depression screen [facility] 2/9 03/15/2017 04/23/2012  Decreased Interest 0 0  Down, Depressed, Hopeless 0 0  PHQ - 2 Score 0 0    Quality of Life: Quality of Life - 06/28/17 1125      Quality of Life Scores   Health/Function Pre  28.4 %    Health/Function Post  28.07 %    Health/Function % Change  -1.16 %    Socioeconomic Pre  30 %    Socioeconomic Post  28.21 %    Socioeconomic % Change   -5.97 %    Psych/Spiritual Pre  30 %    Psych/Spiritual Post   30 %    Psych/Spiritual % Change  0 %    Family Pre  30 %    Family Post  30 %    Family % Change  0 %    GLOBAL Pre  29.3 %    GLOBAL Post  28.78 %    GLOBAL % Change  -1.77 %       Personal Goals: Goals established at orientation with interventions provided to work toward goal. Personal Goals and Risk Factors at Admission - 03/07/17 1135      Core Components/Risk Factors/Patient Goals on Admission   Diabetes  Yes    Intervention  Provide education about proper nutrition, including hydration, and aerobic/resistive exercise prescription along with prescribed medications to achieve blood glucose in normal ranges: Fasting glucose 65-99 mg/dL;Provide education about signs/symptoms and action to take for hypo/hyperglycemia.    Expected Outcomes  Short Term: Participant verbalizes understanding of the signs/symptoms and immediate care of hyper/hypoglycemia, proper foot care and importance of medication, aerobic/resistive exercise and nutrition plan for blood glucose control.;Long Term: Attainment of HbA1C < 7%.    Hypertension  Yes    Intervention  Provide education on lifestyle modifcations including regular physical activity/exercise, weight management, moderate sodium restriction and increased consumption of fresh fruit, vegetables, and low fat dairy, alcohol moderation, and smoking cessation.;Monitor prescription use compliance.    Expected Outcomes  Short Term:  Continued assessment and intervention until BP is < 140/65mm HG in hypertensive participants. < 130/67mm HG in hypertensive participants with diabetes, heart failure or chronic kidney disease.;Long Term: Maintenance of blood pressure at goal levels.    Lipids  Yes    Intervention  Provide education and support for participant on nutrition & aerobic/resistive exercise along with prescribed medications to achieve LDL 70mg , HDL >40mg .    Expected Outcomes  Short Term: Participant states understanding of desired cholesterol values and is  compliant with medications prescribed. Participant is following exercise prescription and nutrition guidelines.;Long Term: Cholesterol controlled with medications as prescribed, with individualized exercise RX and with personalized nutrition plan. Value goals: LDL < 70mg , HDL > 40 mg.        Personal Goals Discharge: Goals and Risk Factor Review    Row Name 03/22/17 1116 04/12/17 1118 05/09/17 1433 06/09/17 1628 06/27/17 1523     Core Components/Risk Factors/Patient Goals Review   Personal Goals Review  Diabetes;Hypertension;Lipids  Diabetes;Hypertension;Lipids  Diabetes;Hypertension;Lipids  Diabetes;Hypertension;Lipids  Diabetes;Hypertension;Lipids   Review  pt with multiple CAD RF demonstrates eagerness to participate in CR exercise, nutrition and lifestyle modification education opportunities.   pt with multiple CAD RF demonstrates eagerness to participate in CR exercise, nutrition and lifestyle modification education opportunities. BP and BS well managed at CR.    pt with multiple CAD RF demonstrates eagerness to participate in CR exercise, nutrition and lifestyle modification education opportunities. Blood pressure increasing.  Dr. Sharyn Lull made aware of VS trends.  blood sugar  well managed at CR.    pt with stable BP and blood sugar at CR. pt maintains active participation in CR opportunities. pt recent absences due to holiday travel.   pt with stable BP and blood sugar at CR. pt maintains active participation in CR opportunities. pt recent absences due to holiday travel.    Expected Outcomes  pt will participate in CR exercise, nutrition and education opportunities to decrease overall RF.   pt will participate in CR exercise, nutrition and education opportunities to decrease overall RF.   pt will participate in CR exercise, nutrition and education opportunities to decrease overall RF.   pt will participate in CR exercise, nutrition and education opportunities to decrease overall RF.   pt will  participate in CR exercise, nutrition and education opportunities to decrease overall RF.    Row Name 07/19/17 0726             Core Components/Risk Factors/Patient Goals Review   Review  pt completed program with 32 sessions. pt BP and blood sugar stablized in rehab setting. pt plans to continue exercising on his own at [facility].       Expected Outcomes  pt will continue exercise, nutrition and lifestyle modifications to decrease overall RF.          Exercise Goals and Review: Exercise Goals    Row Name 03/07/17 [phone]             Exercise Goals   Increase Physical Activity  Yes       Intervention  Provide advice, education, support and counseling about physical activity/exercise needs.;Develop an individualized exercise prescription for aerobic and resistive training based on initial evaluation findings, risk stratification, comorbidities and participant's personal goals.       Expected Outcomes  Achievement of increased cardiorespiratory fitness and enhanced flexibility, muscular endurance and strength shown through measurements of functional capacity and personal statement of participant.       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       Expected Outcomes  Achievement of increased cardiorespiratory fitness and enhanced flexibility, muscular endurance and strength shown through measurements of functional capacity and personal statement of participant.       Able to understand and use rate of perceived exertion (RPE) scale  Yes       Intervention  Provide education and explanation on how to use RPE scale       Expected Outcomes  Short Term: Able to use RPE daily in rehab  to express subjective intensity level;Long Term:  Able to use RPE to guide intensity level when exercising independently       Understanding of Exercise Prescription  Yes       Intervention  Provide education, explanation, and written materials on patient's individual exercise prescription       Expected Outcomes  Short Term: Able to explain program exercise prescription;Long Term: Able to explain home exercise prescription to exercise independently          Nutrition & Weight - Outcomes: Pre Biometrics - 03/07/17 1135      Pre Biometrics   % Body Fat  29.2 %      Post Biometrics - 06/26/17 1037       Post  Biometrics   Height  5' 6.5" (1.689 m)    Weight  174 lb 13.2 oz (79.3 kg)    Waist Circumference  38 inches    Hip Circumference  39 inches    Waist to Hip Ratio  0.97 %    BMI (Calculated)  27.8    Triceps Skinfold  22 mm    % Body Fat  28.8 %    Grip Strength  32 kg    Flexibility  8 in    Single Leg Stand  2.21 seconds       Nutrition: Nutrition Therapy & Goals - 03/07/17 1520      Nutrition Therapy   Diet  Carb Modified, TLC      Personal Nutrition Goals   Nutrition Goal  CBG concentrations in the normal range or as close to normal as is safely possible.      Intervention Plan   Intervention  Prescribe, educate and counsel regarding individualized specific dietary modifications aiming towards targeted core components such as weight, hypertension, lipid management, diabetes, heart failure and other comorbidities.    Expected Outcomes  Short Term Goal: Understand basic principles of dietary content, such as calories, fat, sodium, cholesterol and nutrients.;Long Term Goal: Adherence to prescribed nutrition plan.       Nutrition Discharge: Nutrition Assessments - 07/21/17 1405      MEDFICTS Scores   Pre Score  12    Post Score  12    Score Difference  0       Education Questionnaire Score: Knowledge Questionnaire Score - 06/28/17 1125      Knowledge  Questionnaire Score   Post Score  23/24       Goals reviewed with patient; copy given to patient.

## 2017-08-23 ENCOUNTER — Ambulatory Visit: Payer: Non-veteran care | Admitting: Primary Care

## 2017-08-29 MED FILL — AMIODARONE HCL 200 MG TAB: 200 | 90 days supply | Qty: 90 | Fill #1

## 2017-08-29 MED FILL — CLOPIDOGREL 75 MG TABLET: 75 | 90 days supply | Qty: 90 | Fill #2

## 2017-09-07 ENCOUNTER — Other Ambulatory Visit: Payer: Self-pay | Admitting: Orthopedic Surgery

## 2017-09-07 DIAGNOSIS — M48061 Spinal stenosis, lumbar region without neurogenic claudication: Secondary | ICD-10-CM

## 2017-09-14 ENCOUNTER — Ambulatory Visit: Payer: Non-veteran care | Admitting: Primary Care

## 2017-12-12 MED FILL — AMIODARONE HCL 200 MG TAB: 200 | 90 days supply | Qty: 90 | Fill #2

## 2018-01-11 ENCOUNTER — Emergency Department (HOSPITAL_COMMUNITY): Payer: Non-veteran care

## 2018-01-11 ENCOUNTER — Encounter (HOSPITAL_COMMUNITY): Payer: Self-pay | Admitting: Emergency Medicine

## 2018-01-11 ENCOUNTER — Emergency Department (HOSPITAL_COMMUNITY)
Admission: EM | Admit: 2018-01-11 | Discharge: 2018-01-11 | Disposition: A | Discharge status: Home / Self Care | Payer: Non-veteran care | Attending: Emergency Medicine | Admitting: Emergency Medicine

## 2018-01-11 ENCOUNTER — Other Ambulatory Visit: Payer: Self-pay

## 2018-01-11 DIAGNOSIS — Z7982 Long term (current) use of aspirin: Secondary | ICD-10-CM | POA: Diagnosis not present

## 2018-01-11 DIAGNOSIS — S20212A Contusion of left front wall of thorax, initial encounter: Secondary | ICD-10-CM | POA: Insufficient documentation

## 2018-01-11 DIAGNOSIS — E119 Type 2 diabetes mellitus without complications: Secondary | ICD-10-CM | POA: Diagnosis not present

## 2018-01-11 DIAGNOSIS — S0990XA Unspecified injury of head, initial encounter: Secondary | ICD-10-CM | POA: Insufficient documentation

## 2018-01-11 DIAGNOSIS — Z79899 Other long term (current) drug therapy: Secondary | ICD-10-CM | POA: Insufficient documentation

## 2018-01-11 DIAGNOSIS — I2510 Atherosclerotic heart disease of native coronary artery without angina pectoris: Secondary | ICD-10-CM | POA: Diagnosis not present

## 2018-01-11 DIAGNOSIS — Z955 Presence of coronary angioplasty implant and graft: Secondary | ICD-10-CM | POA: Insufficient documentation

## 2018-01-11 DIAGNOSIS — I10 Essential (primary) hypertension: Secondary | ICD-10-CM | POA: Insufficient documentation

## 2018-01-11 DIAGNOSIS — W108XXA Fall (on) (from) other stairs and steps, initial encounter: Secondary | ICD-10-CM | POA: Diagnosis not present

## 2018-01-11 DIAGNOSIS — Y9301 Activity, walking, marching and hiking: Secondary | ICD-10-CM | POA: Insufficient documentation

## 2018-01-11 DIAGNOSIS — Z7984 Long term (current) use of oral hypoglycemic drugs: Secondary | ICD-10-CM | POA: Diagnosis not present

## 2018-01-11 DIAGNOSIS — W19XXXA Unspecified fall, initial encounter: Secondary | ICD-10-CM

## 2018-01-11 DIAGNOSIS — Y999 Unspecified external cause status: Secondary | ICD-10-CM | POA: Diagnosis not present

## 2018-01-11 DIAGNOSIS — Y929 Unspecified place or not applicable: Secondary | ICD-10-CM | POA: Diagnosis not present

## 2018-01-11 LAB — URINALYSIS, ROUTINE W REFLEX MICROSCOPIC
BILIRUBIN URINE: NEGATIVE
Glucose, UA: NEGATIVE mg/dL
HGB URINE DIPSTICK: NEGATIVE
KETONES UR: NEGATIVE mg/dL
Leukocytes, UA: NEGATIVE
Nitrite: NEGATIVE
PROTEIN: NEGATIVE mg/dL
SPECIFIC GRAVITY, URINE: 1.014 (ref 1.005–1.030)
pH: 6 (ref 5.0–8.0)

## 2018-01-11 NOTE — ED Provider Notes (Signed)
[facility] EMERGENCY DEPARTMENT Provider Note   CSN: [id number] Arrival date & time: 01/11/18  1247     History   Chief Complaint Chief Complaint  Patient presents with  . Fall    HPI Alexander Donaldson is a 84 y.o. male.  84 year old male complaining of an accidental trip and fall going down the cement steps taking out the trash.  States he lost his footing and came down on his left lateral lower chest wall into the steps and then struck his head on the left temple area.  There is no LOC.  This occurred just this morning.  He denies any headache blurry vision chest pain shortness of breath abdominal pain nausea vomiting numbness or weakness.  He is tried nothing for it.  The history is provided by the patient and a relative.  Fall  This is a new problem. The current episode started 1 to 2 hours ago. The problem occurs constantly. The problem has not changed since onset.Associated symptoms include chest pain (lateral chest wall pain). Pertinent negatives include no abdominal pain, no headaches and no shortness of breath. The symptoms are aggravated by bending and twisting. Nothing relieves the symptoms. He has tried nothing for the symptoms. The treatment provided no relief.    Past Medical History:  Diagnosis Date  . Coronary artery disease   . Diabetes mellitus   . Diverticulitis   . Hypertension     Patient Active Problem List   Diagnosis Date Noted  . Near syncope 02/01/2017    Past Surgical History:  Procedure Laterality Date  . ANGIOPLASTY    . CORONARY STENT INTERVENTION N/A 02/02/2017   Procedure: Coronary Stent Intervention;  Surgeon: Rinaldo Cloud, MD;  Location: [facility] INVASIVE CV LAB;  Service: Cardiovascular;  Laterality: N/A;  . CORONARY STENTS    . LEFT HEART CATH AND CORONARY ANGIOGRAPHY N/A 02/02/2017   Procedure: Left Heart Cath and Coronary Angiography;  Surgeon: Rinaldo Cloud, MD;  Location: [facility] INVASIVE CV LAB;  Service: Cardiovascular;   Laterality: N/A;  . LEFT HEART CATHETERIZATION WITH CORONARY ANGIOGRAM N/A 03/16/2012   Procedure: LEFT HEART CATHETERIZATION WITH CORONARY ANGIOGRAM;  Surgeon: Robynn Pane, MD;  Location: [facility] CATH LAB;  Service: Cardiovascular;  Laterality: N/A;  . PERCUTANEOUS CORONARY STENT INTERVENTION (PCI-S)  03/16/2012   Procedure: PERCUTANEOUS CORONARY STENT INTERVENTION (PCI-S);  Surgeon: Robynn Pane, MD;  Location: [facility] CATH LAB;  Service: Cardiovascular;;  . TONSILLECTOMY          Home Medications    Prior to Admission medications   Medication Sig Start Date End Date Taking? Authorizing Provider  amiodarone (PACERONE) 200 MG tablet Take 1 tablet (200 mg total) by mouth daily. 02/04/17   Rinaldo Cloud, MD  amLODipine (NORVASC) 5 MG tablet Take 1 tablet (5 mg total) by mouth daily. Patient taking differently: Take 5 mg by mouth 2 (two) times daily.  02/03/17   Rinaldo Cloud, MD  aspirin 81 MG tablet Take 81 mg by mouth daily.      [provider]  atorvastatin (LIPITOR) 80 MG tablet Take 80 mg by mouth at bedtime.    [provider]  clopidogrel (PLAVIX) 75 MG tablet Take 75 mg by mouth daily. 01/02/17   [provider]  dextromethorphan (DELSYM) 30 MG/5ML liquid Take 60 mg by mouth 2 (two) times daily as needed for cough.    [provider]  glipiZIDE (GLUCOTROL) 10 MG tablet Take 10 mg by mouth 2 (two) times  daily before a meal. Take 30 minutes before meals    [provider]  losartan (COZAAR) 50 MG tablet Take 1 tablet (50 mg total) by mouth daily. 02/03/17   Rinaldo CloudHarwani, Mohan, MD  metoprolol succinate (TOPROL-XL) 25 MG 24 hr tablet Take 25 mg by mouth daily. 01/09/17   [provider]  nitroGLYCERIN (NITROSTAT) 0.4 MG SL tablet Place 1 tablet (0.4 mg total) under the tongue every 5 (five) minutes x 3 doses as needed for chest pain. 03/18/12 03/18/13  Rinaldo CloudHarwani, Mohan, MD  Omega-3 Fatty Acids (FISH OIL) 1000 MG CAPS Take 1,000 mg by mouth 2 (two) times  daily.    [provider]  psyllium (HYDROCIL/METAMUCIL) 95 % PACK Take 1 packet by mouth 2 (two) times daily.     [provider]    Family History History reviewed. No pertinent family history.  Social History Social History   Tobacco Use  . Smoking status: Never Smoker  . Smokeless tobacco: Never Used  Substance Use Topics  . Alcohol use: No    Comment: social  . Drug use: No     Allergies   Patient has no known allergies.   Review of Systems Review of Systems  Constitutional: Negative for chills and fever.  HENT: Negative for nosebleeds and sore throat.   Eyes: Negative for pain and visual disturbance.  Respiratory: Negative for shortness of breath.   Cardiovascular: Positive for chest pain (lateral chest wall pain). Negative for palpitations.  Gastrointestinal: Negative for abdominal pain, nausea and vomiting.  Genitourinary: Negative for dysuria and hematuria.  Musculoskeletal: Negative for back pain and neck pain.  Skin: Negative for rash.  Neurological: Negative for speech difficulty, weakness, numbness and headaches.     Physical Exam Updated Vital Signs BP (!) 156/71   Pulse 61   Temp 98.2 F (36.8 C) (Oral)   Resp 18   SpO2 100%   Physical Exam  Constitutional: He appears well-developed and well-nourished.  HENT:  Head: Normocephalic and atraumatic.  Right Ear: External ear normal.  Left Ear: External ear normal.  Nose: Nose normal.  Mouth/Throat: Oropharynx is clear and moist.  Eyes: Pupils are equal, round, and reactive to light. Conjunctivae and EOM are normal.  Neck: Normal range of motion. Neck supple.  Cardiovascular: Normal rate, regular rhythm, normal heart sounds and intact distal pulses.  Pulmonary/Chest: Effort normal and breath sounds normal. No stridor. No respiratory distress. He has no wheezes. He has no rales.  He has some reproducible tenderness on his left lower lateral ribs, no crepitus in that area no  ecchymosis.  Abdominal: Soft. He exhibits no mass. There is no tenderness. There is no rebound and no guarding.  Musculoskeletal: Normal range of motion. He exhibits no tenderness or deformity.  Neurological: He is alert. GCS eye subscore is 4. GCS verbal subscore is 5. GCS motor subscore is 6.  Skin: Skin is warm and dry.  Psychiatric: He has a normal mood and affect.  Nursing note and vitals reviewed.    ED Treatments / Results  Labs (all labs ordered are listed, but only abnormal results are displayed) Labs Reviewed  URINALYSIS, ROUTINE W REFLEX MICROSCOPIC    EKG None  Radiology Dg Ribs Unilateral W/chest Left  Result Date: 01/11/2018 CLINICAL DATA:  Tripped and fell today with left-sided chest pain. EXAM: LEFT RIBS AND CHEST - 3+ VIEW COMPARISON:  7.52 1,018 FINDINGS: Borderline cardiomegaly. Coronary artery stents and aortic atherosclerosis noted. The lungs are clear. No pneumothorax or  hemothorax. Left rib films do not show a definite visible rib fracture. One could question a minimal fracture at the tip of the eleventh rib. Left renal calculus incidentally demonstrated. IMPRESSION: No active cardiopulmonary disease. No definite rib fracture. Question minimal fracture at the tip of the eleventh rib. Aortic atherosclerosis. Coronary artery stent. Left renal calculus. Electronically Signed   By: Paulina Fusi M.D.   On: 01/11/2018 13:33   Ct Head Wo Contrast  Result Date: 01/11/2018 CLINICAL DATA:  Post fall, suffering head trauma EXAM: CT HEAD WITHOUT CONTRAST TECHNIQUE: Contiguous axial images were obtained from the base of the skull through the vertex without intravenous contrast. COMPARISON:  05/10/2005; brain MRI-01/24/2013 FINDINGS: Brain: Similar findings of atrophy with sulcal prominence and centralized volume loss with commensurate ex vacuo dilatation of the ventricular system. Scattered periventricular hypodensities compatible with microvascular ischemic disease. No CT evidence  of acute large territory infarct. No intraparenchymal or extra-axial mass or hemorrhage. Unchanged size and configuration the ventricles and basilar cisterns. No midline shift. Vascular: Intracranial atherosclerosis. Skull: No displaced calvarial fracture. Sinuses/Orbits: Polypoid mucosal thickening involving the medial wall of the right maxillary sinus. There is minimal mucosal thickening involving the right posterior ethmoidal air cells. The remaining paranasal sinuses and mastoid air cells are normally aerated. No air-fluid levels. Other: Regional soft tissues appear normal. No radiopaque foreign body. IMPRESSION: Similar findings of atrophy and microvascular disease without superimposed acute intracranial process. Electronically Signed   By: Simonne Come M.D.   On: 01/11/2018 13:46    Procedures Procedures (including critical care time)  Medications Ordered in ED Medications - No data to display   Initial Impression / Assessment and Plan / ED Course  I have reviewed the triage vital signs and the nursing notes.  Pertinent labs & imaging results that were available during my care of the patient were reviewed by me and considered in my medical decision making (see chart for details).  Clinical Course as of Jan 12 845  Thu Jan 11, 2018  1431 mechanical fall striking the left side of his head in the left chest wall lateral.  His chest x-ray stated possible rib fracture on #11 head CT negative.  Urinalysis also negative.  Reviewed all this with the patient and his family and they are comfortable going home Tylenol for pain and will return if any worsening symptoms.   [MB]    Clinical Course User Index [MB] Terrilee Files, MD     Final Clinical Impressions(s) / ED Diagnoses   Final diagnoses:  Fall, initial encounter  Injury of head, initial encounter  Chest wall contusion, left, initial encounter    ED Discharge Orders    None       Terrilee Files, MD 01/12/18 [phone]

## 2018-01-11 NOTE — ED Notes (Signed)
Pt given crackers and apple juice.  

## 2018-01-11 NOTE — ED Notes (Signed)
Patient transported to X-ray 

## 2018-01-11 NOTE — Discharge Instructions (Addendum)
You were evaluated in the emergency room for a fall in which she struck your head and the left side of your chest.  You had a CAT scan of your head and a chest x-ray.  There is a possible left-sided rib fracture.  You should use Tylenol for pain and apply ice to the affected areas.  Please return if any worsening pain, shortness of breath, fever or any other concerns.

## 2018-01-11 NOTE — ED Triage Notes (Signed)
Pt to ER for evaluation of left lateral rib pain after mechanical fall while taking the trash out this morning. Pt states also hit his head, is not on anticoagulation. Pt in NAD. Denies dizziness or syncope prior to falling. A/o x4.

## 2018-01-20 ENCOUNTER — Emergency Department (HOSPITAL_BASED_OUTPATIENT_CLINIC_OR_DEPARTMENT_OTHER): Payer: Medicare Other

## 2018-01-20 ENCOUNTER — Encounter (HOSPITAL_COMMUNITY): Payer: Self-pay

## 2018-01-20 ENCOUNTER — Emergency Department (HOSPITAL_COMMUNITY)
Admission: EM | Admit: 2018-01-20 | Discharge: 2018-01-20 | Disposition: A | Discharge status: Home / Self Care | Payer: Medicare Other | Attending: Emergency Medicine | Admitting: Emergency Medicine

## 2018-01-20 ENCOUNTER — Other Ambulatory Visit: Payer: Self-pay

## 2018-01-20 DIAGNOSIS — Z7902 Long term (current) use of antithrombotics/antiplatelets: Secondary | ICD-10-CM | POA: Insufficient documentation

## 2018-01-20 DIAGNOSIS — E119 Type 2 diabetes mellitus without complications: Secondary | ICD-10-CM | POA: Diagnosis not present

## 2018-01-20 DIAGNOSIS — Z7982 Long term (current) use of aspirin: Secondary | ICD-10-CM | POA: Insufficient documentation

## 2018-01-20 DIAGNOSIS — M7989 Other specified soft tissue disorders: Secondary | ICD-10-CM

## 2018-01-20 DIAGNOSIS — Z79899 Other long term (current) drug therapy: Secondary | ICD-10-CM | POA: Insufficient documentation

## 2018-01-20 DIAGNOSIS — Z955 Presence of coronary angioplasty implant and graft: Secondary | ICD-10-CM | POA: Diagnosis not present

## 2018-01-20 DIAGNOSIS — Z7984 Long term (current) use of oral hypoglycemic drugs: Secondary | ICD-10-CM | POA: Diagnosis not present

## 2018-01-20 DIAGNOSIS — R262 Difficulty in walking, not elsewhere classified: Secondary | ICD-10-CM | POA: Diagnosis not present

## 2018-01-20 DIAGNOSIS — R2242 Localized swelling, mass and lump, left lower limb: Secondary | ICD-10-CM | POA: Diagnosis not present

## 2018-01-20 DIAGNOSIS — M21372 Foot drop, left foot: Secondary | ICD-10-CM | POA: Diagnosis not present

## 2018-01-20 DIAGNOSIS — R531 Weakness: Secondary | ICD-10-CM

## 2018-01-20 DIAGNOSIS — I2510 Atherosclerotic heart disease of native coronary artery without angina pectoris: Secondary | ICD-10-CM | POA: Insufficient documentation

## 2018-01-20 DIAGNOSIS — I10 Essential (primary) hypertension: Secondary | ICD-10-CM | POA: Insufficient documentation

## 2018-01-20 LAB — COMPREHENSIVE METABOLIC PANEL
ALBUMIN: 4.1 g/dL (ref 3.5–5.0)
ALK PHOS: 86 U/L (ref 38–126)
ALT: 23 U/L (ref 0–44)
AST: 34 U/L (ref 15–41)
Anion gap: 11 (ref 5–15)
BUN: 10 mg/dL (ref 8–23)
CO2: 28 mmol/L (ref 22–32)
CREATININE: 0.99 mg/dL (ref 0.61–1.24)
Calcium: 9.1 mg/dL (ref 8.9–10.3)
Chloride: 100 mmol/L (ref 98–111)
GFR calc Af Amer: >60 mL/min (ref >60)
GFR calc non Af Amer: >60 mL/min (ref >60)
GLUCOSE: 144 mg/dL — AB (ref 70–99)
POTASSIUM: 3.6 mmol/L (ref 3.5–5.1)
Sodium: 139 mmol/L (ref 135–145)
Total Bilirubin: 1.4 mg/dL — ABNORMAL HIGH (ref 0.3–1.2)
Total Protein: 7.2 g/dL (ref 6.5–8.1)

## 2018-01-20 LAB — BRAIN NATRIURETIC PEPTIDE: B Natriuretic Peptide: 63.6 pg/mL (ref 0.0–100.0)

## 2018-01-20 LAB — CBC WITH DIFFERENTIAL/PLATELET
Abs Immature Granulocytes: 0 10*3/uL (ref 0.0–0.1)
Basophils Absolute: 0 10*3/uL (ref 0.0–0.1)
Basophils Relative: 1 %
EOS PCT: 5 %
Eosinophils Absolute: 0.3 10*3/uL (ref 0.0–0.7)
HCT: 44.4 % (ref 39.0–52.0)
HEMOGLOBIN: 15.2 g/dL (ref 13.0–17.0)
Immature Granulocytes: 0 %
LYMPHS ABS: 2 10*3/uL (ref 0.7–4.0)
Lymphocytes Relative: 40 %
MCH: 30.6 pg (ref 26.0–34.0)
MCHC: 34.2 g/dL (ref 30.0–36.0)
MCV: 89.5 fL (ref 78.0–100.0)
Monocytes Absolute: 0.4 10*3/uL (ref 0.1–1.0)
Monocytes Relative: 8 %
Neutro Abs: 2.4 10*3/uL (ref 1.7–7.7)
Neutrophils Relative %: 46 %
Platelets: 204 10*3/uL (ref 150–400)
RBC: 4.96 MIL/uL (ref 4.22–5.81)
RDW: 13.1 % (ref 11.5–15.5)
WBC: 5.1 10*3/uL (ref 4.0–10.5)

## 2018-01-20 LAB — PROTIME-INR
INR: 0.88
Prothrombin Time: 11.9 seconds (ref 11.4–15.2)

## 2018-01-20 MED ORDER — POTASSIUM CHLORIDE ER 10 MEQ PO TBCR: 20.0000 meq | EXTENDED_RELEASE_TABLET | Freq: Every day | ORAL | 0 refills | Status: DC

## 2018-01-20 MED ORDER — PREDNISONE 10 MG PO TABS: 40.0000 mg | ORAL_TABLET | Freq: Every day | ORAL | 0 refills | Status: AC

## 2018-01-20 MED ORDER — FUROSEMIDE 20 MG PO TABS: 20.0000 mg | ORAL_TABLET | Freq: Two times a day (BID) | ORAL | 0 refills | Status: DC

## 2018-01-20 NOTE — ED Provider Notes (Signed)
[facility] EMERGENCY DEPARTMENT Provider Note   CSN: [id number] Arrival date & time: 01/20/18  1215     History   Chief Complaint Chief Complaint  Patient presents with  . Leg Pain    HPI THURSTON BRENDLINGER is a 84 y.o. male presenting for left lower leg weakness and swelling.  Patient states that her left leg swelling began 2 days ago along with a left-sided foot drop.  Patient states that he began noticing that when he walks his steps with his left foot are louder and he has weakness with dorsiflexion of the left foot. Patient was seen on 01/11/2018 for fall onto his left side, during that visit they found that he had a possible left 11th rib fracture.   Patient denies any new pain since his last visit, denies shortness of breath, chest pain, cough, hemoptysis, history of cancer.  Patient takes Plavix.  With multiple cardiac stent history.  HPI  Past Medical History:  Diagnosis Date  . Coronary artery disease   . Diabetes mellitus   . Diverticulitis   . Hypertension     Patient Active Problem List   Diagnosis Date Noted  . Near syncope 02/01/2017    Past Surgical History:  Procedure Laterality Date  . ANGIOPLASTY    . CORONARY STENT INTERVENTION N/A 02/02/2017   Procedure: Coronary Stent Intervention;  Surgeon: Rinaldo Cloud, MD;  Location: [facility] INVASIVE CV LAB;  Service: Cardiovascular;  Laterality: N/A;  . CORONARY STENTS    . LEFT HEART CATH AND CORONARY ANGIOGRAPHY N/A 02/02/2017   Procedure: Left Heart Cath and Coronary Angiography;  Surgeon: Rinaldo Cloud, MD;  Location: [facility] INVASIVE CV LAB;  Service: Cardiovascular;  Laterality: N/A;  . LEFT HEART CATHETERIZATION WITH CORONARY ANGIOGRAM N/A 03/16/2012   Procedure: LEFT HEART CATHETERIZATION WITH CORONARY ANGIOGRAM;  Surgeon: Robynn Pane, MD;  Location: [facility] CATH LAB;  Service: Cardiovascular;  Laterality: N/A;  . PERCUTANEOUS CORONARY STENT INTERVENTION (PCI-S)  03/16/2012   Procedure: PERCUTANEOUS  CORONARY STENT INTERVENTION (PCI-S);  Surgeon: Robynn Pane, MD;  Location: [facility] CATH LAB;  Service: Cardiovascular;;  . TONSILLECTOMY          Home Medications    Prior to Admission medications   Medication Sig Start Date End Date Taking? Authorizing Provider  amiodarone (PACERONE) 200 MG tablet Take 1 tablet (200 mg total) by mouth daily. 02/04/17   Rinaldo Cloud, MD  amLODipine (NORVASC) 5 MG tablet Take 1 tablet (5 mg total) by mouth daily. Patient taking differently: Take 5 mg by mouth 2 (two) times daily.  02/03/17   Rinaldo Cloud, MD  aspirin 81 MG tablet Take 81 mg by mouth daily.      [provider]  atorvastatin (LIPITOR) 80 MG tablet Take 80 mg by mouth at bedtime.    [provider]  clopidogrel (PLAVIX) 75 MG tablet Take 75 mg by mouth daily. 01/02/17   [provider]  dextromethorphan (DELSYM) 30 MG/5ML liquid Take 60 mg by mouth 2 (two) times daily as needed for cough.    [provider]  furosemide (LASIX) 20 MG tablet Take 1 tablet (20 mg total) by mouth 2 (two) times daily for 5 days. 01/20/18 01/25/18  Harlene Salts A, PA-C  glipiZIDE (GLUCOTROL) 10 MG tablet Take 10 mg by mouth 2 (two) times daily before a meal. Take 30 minutes before meals    [provider]  losartan (COZAAR) 50 MG tablet Take 1 tablet (50 mg total) by  mouth daily. 02/03/17   Rinaldo Cloud, MD  metoprolol succinate (TOPROL-XL) 25 MG 24 hr tablet Take 25 mg by mouth daily. 01/09/17   [provider]  nitroGLYCERIN (NITROSTAT) 0.4 MG SL tablet Place 1 tablet (0.4 mg total) under the tongue every 5 (five) minutes x 3 doses as needed for chest pain. 03/18/12 03/18/13  Rinaldo Cloud, MD  Omega-3 Fatty Acids (FISH OIL) 1000 MG CAPS Take 1,000 mg by mouth 2 (two) times daily.    [provider]  potassium chloride (K-DUR) 10 MEQ tablet Take 2 tablets (20 mEq total) by mouth daily for 5 days. 01/20/18 01/25/18  Harlene Salts A, PA-C  predniSONE  (DELTASONE) 10 MG tablet Take 4 tablets (40 mg total) by mouth daily for 5 days. 01/20/18 01/25/18  Harlene Salts A, PA-C  psyllium (HYDROCIL/METAMUCIL) 95 % PACK Take 1 packet by mouth 2 (two) times daily.     [provider]    Family History No family history on file.  Social History Social History   Tobacco Use  . Smoking status: Never Smoker  . Smokeless tobacco: Never Used  Substance Use Topics  . Alcohol use: No    Comment: social  . Drug use: No     Allergies   Patient has no known allergies.   Review of Systems Review of Systems  Constitutional: Negative.  Negative for chills, fatigue and fever.  HENT: Negative.  Negative for congestion, sore throat and trouble swallowing.   Eyes: Negative.  Negative for visual disturbance.  Respiratory: Negative.  Negative for cough, chest tightness and shortness of breath.   Cardiovascular: Positive for leg swelling. Negative for chest pain.  Gastrointestinal: Negative.  Negative for abdominal pain, blood in stool, diarrhea, nausea and vomiting.  Genitourinary: Negative.  Negative for dysuria, flank pain and hematuria.  Musculoskeletal: Positive for gait problem. Negative for arthralgias, myalgias and neck pain.  Skin: Negative.  Negative for rash.  Neurological: Positive for weakness. Negative for dizziness, syncope, light-headedness and headaches.     Physical Exam Updated Vital Signs BP (!) 167/75   Pulse 71   Temp 97.9 F (36.6 C) (Oral)   Resp 16   SpO2 94%   Physical Exam  Constitutional: He is oriented to person, place, and time. He appears well-developed and well-nourished. No distress.  HENT:  Head: Normocephalic and atraumatic.  Eyes: Pupils are equal, round, and reactive to light. EOM are normal.  Neck: Normal range of motion. Neck supple. No tracheal deviation present.  Cardiovascular: Normal rate and regular rhythm.  Pulses:      Dorsalis pedis pulses are 3+ on the right side, and 3+ on the  left side.       Posterior tibial pulses are 3+ on the right side, and 3+ on the left side.  Pulmonary/Chest: Effort normal and breath sounds normal. No respiratory distress.  Abdominal: Soft. Bowel sounds are normal. There is no tenderness. There is no rigidity, no rebound and no guarding.  Musculoskeletal: Normal range of motion.  Bilateral pitting edema present to the lower extremities.  Left leg swelling and pitting edema moderately greater than right.  Feet:  Right Foot:  Protective Sensation: 5 sites tested. 5 sites sensed.  Left Foot:  Protective Sensation: 5 sites tested. 5 sites sensed.  Neurological: He is alert and oriented to person, place, and time. No cranial nerve deficit or sensory deficit.  Mental Status:  Alert, thought content appropriate, able to give a coherent history.  Speech slightly slower  however patient's family member states that this is his normal.. Able to follow 2 step commands without difficulty.  Cranial Nerves:  II:  Peripheral visual fields grossly normal, pupils equal, round, reactive to light III,IV, VI: ptosis not present, extra-ocular motions intact bilaterally  V,VII: smile symmetric, facial light touch sensation equal VIII: hearing grossly normal to voice  X: uvula elevates symmetrically  XI: bilateral shoulder shrug symmetric and strong XII: midline tongue extension without fassiculations Motor:  Normal tone. 5/5 strength of BUE major muscle groups including strong and equal grip strength.  Left foot dorsiflexion significantly weaker compared to right foot. Sensory: light touch normal in all extremities. Cerebellar: normal finger-to-nose with bilateral upper extremities Gait: Left foot drop present during ambulation.  Otherwise patient ambulating well in room. CV: 2+ radial and DP/PT pulses   Skin: Skin is warm and dry. Capillary refill takes less than 2 seconds.  Psychiatric: He has a normal mood and affect. His behavior is normal.     ED  Treatments / Results  Labs (all labs ordered are listed, but only abnormal results are displayed) Labs Reviewed  COMPREHENSIVE METABOLIC PANEL - Abnormal; Notable for the following components:      Result Value   Glucose, Bld 144 (*)    Total Bilirubin 1.4 (*)    All other components within normal limits  CBC WITH DIFFERENTIAL/PLATELET  BRAIN NATRIURETIC PEPTIDE  PROTIME-INR    EKG None  Radiology No results found.  Procedures Procedures (including critical care time)  Medications Ordered in ED Medications - No data to display   Initial Impression / Assessment and Plan / ED Course  I have reviewed the triage vital signs and the nursing notes.  Pertinent labs & imaging results that were available during my care of the patient were reviewed by me and considered in my medical decision making (see chart for details).  Clinical Course as of Jan 20 2221  Sat Jan 20, 2018  1827 VASCULAR LAB PRELIMINARY  PRELIMINARY  PRELIMINARY  PRELIMINARY  Left lower extremity venous duplex completed.    Preliminary report:  There is no DVT or SVT noted in the left lower extremity.  Called results to [facility] Kahlan Engebretson, PA-C  BaysideKANADY, [facility] CampusCANDACE, RVT 01/20/2018, 5:36 PM      [BM]    Clinical Course User Index [BM] Bill SalinasMorelli, Lichelle Viets A, PA-C   I have discussed this patient with Dr. Juleen ChinaKohut, Dr. Juleen ChinaKohut at has seen the patient. Patient with left foot drop, weakness in dorsiflexion of the left foot.  This likely represents common peroneal nerve entrapment.  Patient with full strength and range of motion of his hips and knees bilaterally.  Neuro exam normal except for left foot drop.  I have given referral [facility] peripheral nerve weakness.   Preliminary DVT study was negative. Patient with bilateral pitting edema left slightly worse than right, we will treat with Lasix to hope to decrease swelling.   Patient being sent home with  prednisone to help decrease peripheral nerve inflammation, Lasix to help reduce swelling and possible peroneal nerve entrapment, potassium to prevent hypokalemia.  I have advised patient and his family to buy a foot drop brace from their local store to prevent falling.  Patient's family member states understanding and says that she will get one today.  Patient and family state that [facility].  I have also advised that they follow-up with  her primary care provider regarding her visit today.  At this time there does not appear to be any evidence of an acute emergency medical condition and the patient appears stable for discharge with appropriate outpatient follow up. Diagnosis was discussed with patient who verbalizes understanding and is agreeable to discharge. I have discussed return precautions with patient and family who verbalize understanding of return precautions. Patient strongly encouraged to follow-up with their PCP.  Patient's case discussed with and seen by Dr. Juleen China who agrees with medications and plan to discharge with follow-up.     This note was dictated using DragonOne dictation software; please contact for any inconsistencies within the note.   Final Clinical Impressions(s) / ED Diagnoses   Final diagnoses:  Left foot drop  Swelling of lower leg    ED Discharge Orders        Ordered    potassium chloride (K-DUR) 10 MEQ tablet  Daily     01/20/18 1928    furosemide (LASIX) 20 MG tablet  2 times daily     01/20/18 1928    predniSONE (DELTASONE) 10 MG tablet  Daily     01/20/18 1928       Elizabeth Palau 01/20/18 2223    Raeford Razor, MD 01/22/18 1534

## 2018-01-20 NOTE — ED Provider Notes (Signed)
Medical screening examination/treatment/procedure(s) were conducted as a shared visit with non-physician practitioner(s) and myself.  I personally evaluated the patient during the encounter.  None  84yM with L foot drop. Likely peripheral nerve issue. Isolated finding. No back pain. No deficits at hip or knee. Possibly compressive with the swelling he is having in his leg. US negative for DVT.   Plan a few days of lasix. Keep elevated. Prednisone. Orthotic. Neurology FU if persists.    Raeford RazorKohut, Toron Bowring, MD 01/22/18 [phone]

## 2018-01-20 NOTE — Discharge Instructions (Signed)
Please pick up an AFO/foot drop brace from your drugstore or supermarket.  Using these types of braces will help you walk and make it less likely that you will trip. Follow-up with [facility] in regards to your visit today. Please take your Lasix as prescribed, this will help with your leg swelling. Please take your potassium as prescribed. Please take your prednisone as prescribed.  Prednisone can raise your blood sugar levels, be sure to check your blood sugar levels often while taking this medication. Please follow-up with your primary care provider tomorrow morning regarding your visit today. Please return to the emergency department for any new or worsening symptoms. Contact a doctor if: Treatment is not working. You have heart, liver, or kidney disease and have symptoms of edema. You have sudden and unexplained weight gain. Get help right away if: You have shortness of breath or chest pain. You cannot breathe when you lie down. You have pain, redness, or warmth in the swollen areas. You have heart, liver, or kidney disease and get edema all of a sudden. You have a fever and your symptoms get worse all of a sudden. Contact a health care provider if: Your symptoms do not get better in 2-3 months. The weakness or numbness in your leg or foot gets worse.

## 2018-01-20 NOTE — ED Notes (Signed)
Vascular tech is aware of patient 

## 2018-01-20 NOTE — ED Triage Notes (Addendum)
Pt presents for evaluation of lower leg weakness since wednesday. States was here on 7/4 for fall while taking the trash out. Pt is on plavix, had fractured L rib. Pt wife reports he has been having issues with lower leg swelling to left and is dragging foot sometimes.

## 2018-01-20 NOTE — ED Notes (Signed)
Patient verbalizes understanding of discharge instructions. Opportunity for questioning and answers were provided. Pt wheeled out to lobby in wheelchair in NAD with family.

## 2018-01-20 NOTE — ED Notes (Signed)
Pt transported to US

## 2018-01-20 NOTE — Progress Notes (Signed)
VASCULAR LAB PRELIMINARY  PRELIMINARY  PRELIMINARY  PRELIMINARY  Left lower extremity venous duplex completed.    Preliminary report:  There is no DVT or SVT noted in the left lower extremity.  Called results to [facility] Morelli, PA-C  [facility], [facility], RVT 01/20/2018, 5:36 PM

## 2018-02-11 ENCOUNTER — Encounter (HOSPITAL_COMMUNITY): Payer: Self-pay | Admitting: Emergency Medicine

## 2018-02-11 ENCOUNTER — Inpatient Hospital Stay (HOSPITAL_COMMUNITY)
Admission: EM | Admit: 2018-02-11 | Discharge: 2018-02-15 | DRG: 193 | Disposition: A | Discharge status: Home Health | Payer: Medicare Other | Attending: Internal Medicine | Admitting: Internal Medicine

## 2018-02-11 ENCOUNTER — Emergency Department (HOSPITAL_COMMUNITY): Payer: Medicare Other

## 2018-02-11 DIAGNOSIS — E119 Type 2 diabetes mellitus without complications: Secondary | ICD-10-CM | POA: Diagnosis present

## 2018-02-11 DIAGNOSIS — J189 Pneumonia, unspecified organism: Secondary | ICD-10-CM

## 2018-02-11 DIAGNOSIS — J9601 Acute respiratory failure with hypoxia: Secondary | ICD-10-CM | POA: Diagnosis not present

## 2018-02-11 DIAGNOSIS — Z7902 Long term (current) use of antithrombotics/antiplatelets: Secondary | ICD-10-CM | POA: Diagnosis not present

## 2018-02-11 DIAGNOSIS — I2510 Atherosclerotic heart disease of native coronary artery without angina pectoris: Secondary | ICD-10-CM | POA: Diagnosis present

## 2018-02-11 DIAGNOSIS — N281 Cyst of kidney, acquired: Secondary | ICD-10-CM | POA: Diagnosis present

## 2018-02-11 DIAGNOSIS — E785 Hyperlipidemia, unspecified: Secondary | ICD-10-CM | POA: Diagnosis present

## 2018-02-11 DIAGNOSIS — E876 Hypokalemia: Secondary | ICD-10-CM | POA: Diagnosis present

## 2018-02-11 DIAGNOSIS — M21372 Foot drop, left foot: Secondary | ICD-10-CM | POA: Diagnosis present

## 2018-02-11 DIAGNOSIS — G9341 Metabolic encephalopathy: Secondary | ICD-10-CM | POA: Diagnosis present

## 2018-02-11 DIAGNOSIS — D649 Anemia, unspecified: Secondary | ICD-10-CM | POA: Diagnosis present

## 2018-02-11 DIAGNOSIS — Z79899 Other long term (current) drug therapy: Secondary | ICD-10-CM

## 2018-02-11 DIAGNOSIS — E871 Hypo-osmolality and hyponatremia: Secondary | ICD-10-CM | POA: Diagnosis present

## 2018-02-11 DIAGNOSIS — I10 Essential (primary) hypertension: Secondary | ICD-10-CM | POA: Diagnosis present

## 2018-02-11 DIAGNOSIS — R627 Adult failure to thrive: Secondary | ICD-10-CM | POA: Diagnosis present

## 2018-02-11 DIAGNOSIS — R7989 Other specified abnormal findings of blood chemistry: Secondary | ICD-10-CM | POA: Diagnosis present

## 2018-02-11 DIAGNOSIS — G9340 Encephalopathy, unspecified: Secondary | ICD-10-CM | POA: Diagnosis not present

## 2018-02-11 DIAGNOSIS — R945 Abnormal results of liver function studies: Secondary | ICD-10-CM

## 2018-02-11 DIAGNOSIS — I110 Hypertensive heart disease with heart failure: Secondary | ICD-10-CM | POA: Diagnosis present

## 2018-02-11 DIAGNOSIS — Z7984 Long term (current) use of oral hypoglycemic drugs: Secondary | ICD-10-CM

## 2018-02-11 DIAGNOSIS — Z955 Presence of coronary angioplasty implant and graft: Secondary | ICD-10-CM

## 2018-02-11 DIAGNOSIS — R112 Nausea with vomiting, unspecified: Secondary | ICD-10-CM | POA: Diagnosis not present

## 2018-02-11 DIAGNOSIS — J181 Lobar pneumonia, unspecified organism: Principal | ICD-10-CM | POA: Diagnosis present

## 2018-02-11 DIAGNOSIS — I5022 Chronic systolic (congestive) heart failure: Secondary | ICD-10-CM | POA: Diagnosis present

## 2018-02-11 DIAGNOSIS — F0390 Unspecified dementia without behavioral disturbance: Secondary | ICD-10-CM | POA: Diagnosis present

## 2018-02-11 DIAGNOSIS — E1159 Type 2 diabetes mellitus with other circulatory complications: Secondary | ICD-10-CM | POA: Diagnosis present

## 2018-02-11 DIAGNOSIS — Z7982 Long term (current) use of aspirin: Secondary | ICD-10-CM

## 2018-02-11 DIAGNOSIS — I248 Other forms of acute ischemic heart disease: Secondary | ICD-10-CM | POA: Diagnosis present

## 2018-02-11 HISTORY — DX: Pneumonia, unspecified organism: J18.9

## 2018-02-11 LAB — URINALYSIS, ROUTINE W REFLEX MICROSCOPIC
Bacteria, UA: NONE SEEN
Bilirubin Urine: NEGATIVE
Glucose, UA: 150 mg/dL — AB
Ketones, ur: 5 mg/dL — AB
LEUKOCYTES UA: NEGATIVE
Nitrite: NEGATIVE
PROTEIN: 100 mg/dL — AB
SPECIFIC GRAVITY, URINE: 1.015 (ref 1.005–1.030)
pH: 6 (ref 5.0–8.0)

## 2018-02-11 LAB — CBC WITH DIFFERENTIAL/PLATELET
ABS IMMATURE GRANULOCYTES: 0 10*3/uL (ref 0.0–0.1)
BASOS PCT: 0 %
Basophils Absolute: 0 10*3/uL (ref 0.0–0.1)
Eosinophils Absolute: 0.1 10*3/uL (ref 0.0–0.7)
Eosinophils Relative: 1 %
HCT: 38 % — ABNORMAL LOW (ref 39.0–52.0)
HEMOGLOBIN: 12.8 g/dL — AB (ref 13.0–17.0)
Immature Granulocytes: 0 %
LYMPHS ABS: 1.4 10*3/uL (ref 0.7–4.0)
Lymphocytes Relative: 16 %
MCH: 30.3 pg (ref 26.0–34.0)
MCHC: 33.7 g/dL (ref 30.0–36.0)
MCV: 90 fL (ref 78.0–100.0)
MONO ABS: 1.2 10*3/uL — AB (ref 0.1–1.0)
MONOS PCT: 13 %
NEUTROS ABS: 6.3 10*3/uL (ref 1.7–7.7)
Neutrophils Relative %: 70 %
Platelets: 199 10*3/uL (ref 150–400)
RBC: 4.22 MIL/uL (ref 4.22–5.81)
RDW: 12.5 % (ref 11.5–15.5)
WBC: 9 10*3/uL (ref 4.0–10.5)

## 2018-02-11 LAB — COMPREHENSIVE METABOLIC PANEL
ALK PHOS: 137 U/L — AB (ref 38–126)
ALT: 108 U/L — AB (ref 0–44)
ANION GAP: 11 (ref 5–15)
AST: 83 U/L — ABNORMAL HIGH (ref 15–41)
Albumin: 3.4 g/dL — ABNORMAL LOW (ref 3.5–5.0)
BUN: 11 mg/dL (ref 8–23)
CALCIUM: 8.2 mg/dL — AB (ref 8.9–10.3)
CHLORIDE: 91 mmol/L — AB (ref 98–111)
CO2: 26 mmol/L (ref 22–32)
Creatinine, Ser: 1.07 mg/dL (ref 0.61–1.24)
GFR calc Af Amer: >60 mL/min (ref >60)
GFR calc non Af Amer: >60 mL/min (ref >60)
GLUCOSE: 196 mg/dL — AB (ref 70–99)
Potassium: 2.9 mmol/L — ABNORMAL LOW (ref 3.5–5.1)
SODIUM: 128 mmol/L — AB (ref 135–145)
Total Bilirubin: 1.7 mg/dL — ABNORMAL HIGH (ref 0.3–1.2)
Total Protein: 7 g/dL (ref 6.5–8.1)

## 2018-02-11 LAB — I-STAT CG4 LACTIC ACID, ED: Lactic Acid, Venous: 1.44 mmol/L (ref 0.5–1.9)

## 2018-02-11 LAB — CBG MONITORING, ED: GLUCOSE-CAPILLARY: 188 mg/dL — AB (ref 70–99)

## 2018-02-11 MED ORDER — SODIUM CHLORIDE 0.9 % IV SOLN
500.0000 mg | Freq: Once | INTRAVENOUS | Status: AC
Start: 2018-02-11 — End: 2018-02-12
  Administered 2018-02-11: 500 mg via INTRAVENOUS
  Filled 2018-02-11: qty 500

## 2018-02-11 MED ORDER — SODIUM CHLORIDE 0.9 % IV BOLUS
1000.0000 mL | Freq: Once | INTRAVENOUS | Status: AC
  Administered 2018-02-11: 1000 mL via INTRAVENOUS

## 2018-02-11 MED ORDER — SODIUM CHLORIDE 0.9 % IV SOLN
1.0000 g | Freq: Once | INTRAVENOUS | Status: AC
  Administered 2018-02-11: 1 g via INTRAVENOUS
  Filled 2018-02-11: qty 10

## 2018-02-11 MED ORDER — ACETAMINOPHEN 500 MG PO TABS
1000.0000 mg | ORAL_TABLET | Freq: Once | ORAL | Status: AC
  Administered 2018-02-11: 1000 mg via ORAL
  Filled 2018-02-11: qty 2

## 2018-02-11 NOTE — ED Provider Notes (Signed)
[facility] EMERGENCY DEPARTMENT Provider Note   CSN: [id number] Arrival date & time: 02/11/18  1959     History   Chief Complaint Chief Complaint  Patient presents with  . Dizziness  . Altered Mental Status  . Fever    HPI Alexander Donaldson is a 84 y.o. male.  84 yo M with a chief complaint of cough and fever and confusion.  Going on for the past day or so.  Has been coughing for the past couple months but had resolution after a course of Augmentin.  Was fine for the past month or so.  Denies sick contacts.  He did fall about 3 weeks ago and hurt his left chest wall.  Was told he had a broken rib.  Since then has been his normal state of health.  Complaining of a mild headache on the top of his head.  Worse with coughing.  Is also a bit lightheaded.  Not eating and drinking as much at home.  The history is provided by the patient, a relative and the spouse.  Dizziness  Associated symptoms: headaches   Associated symptoms: no chest pain, no diarrhea, no palpitations, no shortness of breath and no vomiting   Altered Mental Status   Pertinent negatives include no confusion.  Fever   Associated symptoms include headaches and cough. Pertinent negatives include no chest pain, no diarrhea, no vomiting and no congestion.  Illness  This is a new problem. The current episode started 2 days ago. The problem occurs constantly. The problem has not changed since onset.Associated symptoms include headaches. Pertinent negatives include no chest pain, no abdominal pain and no shortness of breath. Nothing aggravates the symptoms. Nothing relieves the symptoms. He has tried nothing for the symptoms. The treatment provided no relief.    Past Medical History:  Diagnosis Date  . Coronary artery disease   . Diabetes mellitus   . Diverticulitis   . Hypertension     Patient Active Problem List   Diagnosis Date Noted  . CAP (community acquired pneumonia) 02/11/2018  . Near syncope  02/01/2017    Past Surgical History:  Procedure Laterality Date  . ANGIOPLASTY    . CORONARY STENT INTERVENTION N/A 02/02/2017   Procedure: Coronary Stent Intervention;  Surgeon: Rinaldo Cloud, MD;  Location: [facility] INVASIVE CV LAB;  Service: Cardiovascular;  Laterality: N/A;  . CORONARY STENTS    . LEFT HEART CATH AND CORONARY ANGIOGRAPHY N/A 02/02/2017   Procedure: Left Heart Cath and Coronary Angiography;  Surgeon: Rinaldo Cloud, MD;  Location: [facility] INVASIVE CV LAB;  Service: Cardiovascular;  Laterality: N/A;  . LEFT HEART CATHETERIZATION WITH CORONARY ANGIOGRAM N/A 03/16/2012   Procedure: LEFT HEART CATHETERIZATION WITH CORONARY ANGIOGRAM;  Surgeon: Robynn Pane, MD;  Location: [facility] CATH LAB;  Service: Cardiovascular;  Laterality: N/A;  . PERCUTANEOUS CORONARY STENT INTERVENTION (PCI-S)  03/16/2012   Procedure: PERCUTANEOUS CORONARY STENT INTERVENTION (PCI-S);  Surgeon: Robynn Pane, MD;  Location: [facility] CATH LAB;  Service: Cardiovascular;;  . TONSILLECTOMY          Home Medications    Prior to Admission medications   Medication Sig Start Date End Date Taking? Authorizing Provider  amiodarone (PACERONE) 200 MG tablet Take 1 tablet (200 mg total) by mouth daily. 02/04/17  Yes Rinaldo Cloud, MD  amLODipine (NORVASC) 5 MG tablet Take 1 tablet (5 mg total) by mouth daily. Patient taking differently: Take 5 mg by mouth 2 (two) times daily.  02/03/17  Yes Harwani,  Marlane MingleMohan, MD  aspirin 81 MG tablet Take 81 mg by mouth daily.     Yes [provider]  atorvastatin (LIPITOR) 80 MG tablet Take 80 mg by mouth at bedtime.   Yes [provider]  clopidogrel (PLAVIX) 75 MG tablet Take 75 mg by mouth daily. 01/02/17  Yes [provider]  dextromethorphan (DELSYM) 30 MG/5ML liquid Take 60 mg by mouth 2 (two) times daily as needed for cough.   Yes [provider]  glipiZIDE (GLUCOTROL) 10 MG tablet Take 10 mg by mouth 2 (two) times daily before a meal. Take 30 minutes before  meals   Yes [provider]  losartan (COZAAR) 50 MG tablet Take 1 tablet (50 mg total) by mouth daily. Patient taking differently: Take 100 mg by mouth every evening.  02/03/17  Yes Rinaldo CloudHarwani, Mohan, MD  metoprolol succinate (TOPROL-XL) 25 MG 24 hr tablet Take 50 mg by mouth every evening.  01/09/17  Yes [provider]  nitroGLYCERIN (NITROSTAT) 0.4 MG SL tablet Place 1 tablet (0.4 mg total) under the tongue every 5 (five) minutes x 3 doses as needed for chest pain. 03/18/12 02/11/18 Yes Rinaldo CloudHarwani, Mohan, MD  Omega-3 Fatty Acids (FISH OIL) 1000 MG CAPS Take 1,000 mg by mouth 2 (two) times daily.   Yes [provider]  psyllium (HYDROCIL/METAMUCIL) 95 % PACK Take 1 packet by mouth 2 (two) times daily.    Yes [provider]  furosemide (LASIX) 20 MG tablet Take 1 tablet (20 mg total) by mouth 2 (two) times daily for 5 days. Patient not taking: Reported on 02/11/2018 01/20/18 01/25/18  Harlene SaltsMorelli, Brandon A, PA-C  potassium chloride (K-DUR) 10 MEQ tablet Take 2 tablets (20 mEq total) by mouth daily for 5 days. Patient not taking: Reported on 02/11/2018 01/20/18 01/25/18  Elizabeth PalauMorelli, Brandon A, PA-C    Family History History reviewed. No pertinent family history.  Social History Social History   Tobacco Use  . Smoking status: Never Smoker  . Smokeless tobacco: Never Used  Substance Use Topics  . Alcohol use: No    Comment: social  . Drug use: No     Allergies   Patient has no known allergies.   Review of Systems Review of Systems  Constitutional: Positive for fever. Negative for chills.  HENT: Negative for congestion and facial swelling.   Eyes: Negative for discharge and visual disturbance.  Respiratory: Positive for cough. Negative for shortness of breath.   Cardiovascular: Negative for chest pain and palpitations.  Gastrointestinal: Negative for abdominal pain, diarrhea and vomiting.  Musculoskeletal: Negative for arthralgias and myalgias.  Skin: Negative for  color change and rash.  Neurological: Positive for dizziness and headaches. Negative for tremors and syncope.  Psychiatric/Behavioral: Negative for confusion and dysphoric mood.     Physical Exam Updated Vital Signs BP (!) 158/84 (BP Location: Right Arm)   Pulse (!) 104   Temp (!) 101.4 F (38.6 C)   Resp (!) 22   SpO2 94%   Physical Exam  Constitutional: He is oriented to person, place, and time. He appears well-developed and well-nourished.  HENT:  Head: Normocephalic and atraumatic.  Eyes: Pupils are equal, round, and reactive to light. EOM are normal.  Neck: Normal range of motion. Neck supple. No JVD present.  Cardiovascular: Normal rate and regular rhythm. Exam reveals no gallop and no friction rub.  No murmur heard. Pulmonary/Chest: No respiratory distress. He has no wheezes.  Diminished breath sounds to the right middle and upper lobe.  Abdominal: He exhibits no distension. There is no rebound and no guarding.  Musculoskeletal: Normal range of motion.  Neurological: He is alert and oriented to person, place, and time.  Skin: No rash noted. No pallor.  Psychiatric: He has a normal mood and affect. His behavior is normal.  Nursing note and vitals reviewed.    ED Treatments / Results  Labs (all labs ordered are listed, but only abnormal results are displayed) Labs Reviewed  COMPREHENSIVE METABOLIC PANEL - Abnormal; Notable for the following components:      Result Value   Sodium 128 (*)    Potassium 2.9 (*)    Chloride 91 (*)    Glucose, Bld 196 (*)    Calcium 8.2 (*)    Albumin 3.4 (*)    AST 83 (*)    ALT 108 (*)    Alkaline Phosphatase 137 (*)    Total Bilirubin 1.7 (*)    All other components within normal limits  CBC WITH DIFFERENTIAL/PLATELET - Abnormal; Notable for the following components:   Hemoglobin 12.8 (*)    HCT 38.0 (*)    Monocytes Absolute 1.2 (*)    All other components within normal limits  URINALYSIS, ROUTINE W REFLEX MICROSCOPIC -  Abnormal; Notable for the following components:   Glucose, UA 150 (*)    Hgb urine dipstick MODERATE (*)    Ketones, ur 5 (*)    Protein, ur 100 (*)    All other components within normal limits  CBG MONITORING, ED - Abnormal; Notable for the following components:   Glucose-Capillary 188 (*)    All other components within normal limits  CULTURE, BLOOD (ROUTINE X 2)  CULTURE, BLOOD (ROUTINE X 2)  I-STAT CG4 LACTIC ACID, ED  I-STAT CG4 LACTIC ACID, ED    EKG None  Radiology Dg Chest 2 View  Result Date: 02/11/2018 CLINICAL DATA:  Acute onset of confusion and fever. Urinary incontinence. EXAM: CHEST - 2 VIEW COMPARISON:  Chest radiograph performed 01/11/2018 FINDINGS: New right midlung airspace opacification is compatible with pneumonia. No pleural effusion or pneumothorax is seen. The heart is normal in size. No acute osseous abnormalities are identified. There is chronic superior subluxation of both humeral heads, reflecting chronic rotator cuff tears. Calcification is noted along the abdominal aorta. IMPRESSION: New right midlung airspace opacification is compatible with pneumonia. Electronically Signed   By: Roanna Raider M.D.   On: 02/11/2018 21:04    Procedures Procedures (including critical care time)  Medications Ordered in ED Medications  sodium chloride 0.9 % bolus 1,000 mL (1,000 mLs Intravenous New Bag/Given 02/11/18 2307)  cefTRIAXone (ROCEPHIN) 1 g in sodium chloride 0.9 % 100 mL IVPB (1 g Intravenous New Bag/Given 02/11/18 2310)  azithromycin (ZITHROMAX) 500 mg in sodium chloride 0.9 % 250 mL IVPB (has no administration in time range)  acetaminophen (TYLENOL) tablet 1,000 mg (1,000 mg Oral Given 02/11/18 2305)     Initial Impression / Assessment and Plan / ED Course  I have reviewed the triage vital signs and the nursing notes.  Pertinent labs & imaging results that were available during my care of the patient were reviewed by me and considered in my medical decision  making (see chart for details).     84 yo M with a chief complaint of weakness and cough and fever.  Found to have right middle lobe pneumonia.  Appears to be clinically dry as well.  We will give a bolus of fluids.  Start on Antibiotics.  Discussed  with hospitalist for admission.  The patients results and plan were reviewed and discussed.   Any x-rays performed were independently reviewed by myself.   Differential diagnosis were considered with the presenting HPI.  Medications  sodium chloride 0.9 % bolus 1,000 mL (1,000 mLs Intravenous New Bag/Given 02/11/18 2307)  cefTRIAXone (ROCEPHIN) 1 g in sodium chloride 0.9 % 100 mL IVPB (1 g Intravenous New Bag/Given 02/11/18 2310)  azithromycin (ZITHROMAX) 500 mg in sodium chloride 0.9 % 250 mL IVPB (has no administration in time range)  acetaminophen (TYLENOL) tablet 1,000 mg (1,000 mg Oral Given 02/11/18 2305)    Vitals:   02/11/18 2007 02/11/18 2134  BP: (!) 158/107 (!) 158/84  Pulse: 95 (!) 104  Resp: 18 (!) 22  Temp: 100.1 F (37.8 C) (!) 101.4 F (38.6 C)  TempSrc: Oral   SpO2: 100% 94%    Final diagnoses:  Community acquired pneumonia of right middle lobe of lung (HCC)    Admission/ observation were discussed with the admitting physician, patient and/or family and they are comfortable with the plan.    Final Clinical Impressions(s) / ED Diagnoses   Final diagnoses:  Community acquired pneumonia of right middle lobe of lung [facility])    ED Discharge Orders    None       Melene Plan, DO 02/11/18 2326

## 2018-02-11 NOTE — ED Triage Notes (Signed)
Pt presents with fever today with confusion and new urinary incontinence; pts wife states she gave a tylenol today for fever of 101 and he went to take a nap and woke up disoriented and didn't not know his own house

## 2018-02-12 ENCOUNTER — Inpatient Hospital Stay (HOSPITAL_COMMUNITY): Payer: Medicare Other

## 2018-02-12 ENCOUNTER — Other Ambulatory Visit: Payer: Self-pay

## 2018-02-12 ENCOUNTER — Encounter (HOSPITAL_COMMUNITY): Payer: Self-pay | Admitting: Internal Medicine

## 2018-02-12 DIAGNOSIS — G9340 Encephalopathy, unspecified: Secondary | ICD-10-CM

## 2018-02-12 DIAGNOSIS — E876 Hypokalemia: Secondary | ICD-10-CM

## 2018-02-12 DIAGNOSIS — R112 Nausea with vomiting, unspecified: Secondary | ICD-10-CM | POA: Diagnosis present

## 2018-02-12 DIAGNOSIS — I10 Essential (primary) hypertension: Secondary | ICD-10-CM | POA: Diagnosis present

## 2018-02-12 DIAGNOSIS — I2510 Atherosclerotic heart disease of native coronary artery without angina pectoris: Secondary | ICD-10-CM | POA: Diagnosis present

## 2018-02-12 DIAGNOSIS — R945 Abnormal results of liver function studies: Secondary | ICD-10-CM

## 2018-02-12 DIAGNOSIS — E119 Type 2 diabetes mellitus without complications: Secondary | ICD-10-CM | POA: Diagnosis present

## 2018-02-12 DIAGNOSIS — M21372 Foot drop, left foot: Secondary | ICD-10-CM | POA: Diagnosis present

## 2018-02-12 DIAGNOSIS — R7989 Other specified abnormal findings of blood chemistry: Secondary | ICD-10-CM | POA: Diagnosis present

## 2018-02-12 DIAGNOSIS — E1159 Type 2 diabetes mellitus with other circulatory complications: Secondary | ICD-10-CM | POA: Diagnosis present

## 2018-02-12 HISTORY — DX: Hypokalemia: E87.6

## 2018-02-12 HISTORY — DX: Encephalopathy, unspecified: G93.40

## 2018-02-12 LAB — CBC WITH DIFFERENTIAL/PLATELET
ABS IMMATURE GRANULOCYTES: 0.1 10*3/uL (ref 0.0–0.1)
BASOS ABS: 0 10*3/uL (ref 0.0–0.1)
Basophils Relative: 0 %
Eosinophils Absolute: 0.1 10*3/uL (ref 0.0–0.7)
Eosinophils Relative: 1 %
HCT: 34.8 % — ABNORMAL LOW (ref 39.0–52.0)
Hemoglobin: 11.9 g/dL — ABNORMAL LOW (ref 13.0–17.0)
IMMATURE GRANULOCYTES: 1 %
LYMPHS PCT: 18 %
Lymphs Abs: 1.3 10*3/uL (ref 0.7–4.0)
MCH: 30.5 pg (ref 26.0–34.0)
MCHC: 34.2 g/dL (ref 30.0–36.0)
MCV: 89.2 fL (ref 78.0–100.0)
MONO ABS: 1 10*3/uL (ref 0.1–1.0)
Monocytes Relative: 14 %
NEUTROS ABS: 4.9 10*3/uL (ref 1.7–7.7)
NEUTROS PCT: 66 %
PLATELETS: 163 10*3/uL (ref 150–400)
RBC: 3.9 MIL/uL — AB (ref 4.22–5.81)
RDW: 12.7 % (ref 11.5–15.5)
WBC: 7.4 10*3/uL (ref 4.0–10.5)

## 2018-02-12 LAB — HEPATIC FUNCTION PANEL
ALT: 99 U/L — AB (ref 0–44)
AST: 80 U/L — ABNORMAL HIGH (ref 15–41)
Albumin: 2.9 g/dL — ABNORMAL LOW (ref 3.5–5.0)
Alkaline Phosphatase: 128 U/L — ABNORMAL HIGH (ref 38–126)
BILIRUBIN INDIRECT: 1 mg/dL — AB (ref 0.3–0.9)
Bilirubin, Direct: 0.3 mg/dL — ABNORMAL HIGH (ref 0.0–0.2)
TOTAL PROTEIN: 6.2 g/dL — AB (ref 6.5–8.1)
Total Bilirubin: 1.3 mg/dL — ABNORMAL HIGH (ref 0.3–1.2)

## 2018-02-12 LAB — BASIC METABOLIC PANEL
ANION GAP: 11 (ref 5–15)
BUN: 8 mg/dL (ref 8–23)
CALCIUM: 7.9 mg/dL — AB (ref 8.9–10.3)
CO2: 27 mmol/L (ref 22–32)
Chloride: 98 mmol/L (ref 98–111)
Creatinine, Ser: 0.98 mg/dL (ref 0.61–1.24)
GFR calc Af Amer: >60 mL/min (ref >60)
Glucose, Bld: 162 mg/dL — ABNORMAL HIGH (ref 70–99)
POTASSIUM: 2.8 mmol/L — AB (ref 3.5–5.1)
SODIUM: 136 mmol/L (ref 135–145)

## 2018-02-12 LAB — TROPONIN I
TROPONIN I: 0.07 ng/mL — AB (ref <0.03)
TROPONIN I: 0.07 ng/mL — AB (ref <0.03)
TROPONIN I: 0.12 ng/mL — AB (ref <0.03)

## 2018-02-12 LAB — GLUCOSE, CAPILLARY
GLUCOSE-CAPILLARY: 158 mg/dL — AB (ref 70–99)
GLUCOSE-CAPILLARY: 217 mg/dL — AB (ref 70–99)
Glucose-Capillary: 157 mg/dL — ABNORMAL HIGH (ref 70–99)
Glucose-Capillary: 146 mg/dL — ABNORMAL HIGH (ref 70–99)

## 2018-02-12 LAB — EXPECTORATED SPUTUM ASSESSMENT W GRAM STAIN, RFLX TO RESP C

## 2018-02-12 LAB — EXPECTORATED SPUTUM ASSESSMENT W REFEX TO RESP CULTURE

## 2018-02-12 LAB — AMMONIA: Ammonia: 35 umol/L (ref 9–35)

## 2018-02-12 LAB — MAGNESIUM: Magnesium: 1.8 mg/dL (ref 1.7–2.4)

## 2018-02-12 LAB — STREP PNEUMONIAE URINARY ANTIGEN: Strep Pneumo Urinary Antigen: NEGATIVE

## 2018-02-12 MED ORDER — POTASSIUM CHLORIDE CRYS ER 20 MEQ PO TBCR
60.0000 meq | EXTENDED_RELEASE_TABLET | Freq: Once | ORAL | Status: AC
  Administered 2018-02-12: 60 meq via ORAL
  Filled 2018-02-12: qty 3

## 2018-02-12 MED ORDER — ASPIRIN EC 81 MG PO TBEC
81.0000 mg | DELAYED_RELEASE_TABLET | Freq: Every day | ORAL | Status: DC
  Administered 2018-02-12 – 2018-02-15 (×4): 81 mg via ORAL
  Filled 2018-02-12 (×4): qty 1

## 2018-02-12 MED ORDER — ACETAMINOPHEN 325 MG PO TABS: 650.0000 mg | ORAL_TABLET | Freq: Four times a day (QID) | ORAL | Status: DC | PRN

## 2018-02-12 MED ORDER — TECHNETIUM TC 99M MEBROFENIN IV KIT
5.3000 | PACK | Freq: Once | INTRAVENOUS | Status: AC | PRN
  Administered 2018-02-12: 5.3 via INTRAVENOUS

## 2018-02-12 MED ORDER — CLOPIDOGREL BISULFATE 75 MG PO TABS
75.0000 mg | ORAL_TABLET | Freq: Every day | ORAL | Status: DC
  Administered 2018-02-12 – 2018-02-15 (×4): 75 mg via ORAL
  Filled 2018-02-12 (×4): qty 1

## 2018-02-12 MED ORDER — ATORVASTATIN CALCIUM 80 MG PO TABS
80.0000 mg | ORAL_TABLET | Freq: Every day | ORAL | Status: DC
  Administered 2018-02-12: 80 mg via ORAL
  Filled 2018-02-12: qty 1

## 2018-02-12 MED ORDER — LOSARTAN POTASSIUM 50 MG PO TABS
100.0000 mg | ORAL_TABLET | Freq: Every evening | ORAL | Status: DC
  Administered 2018-02-12 – 2018-02-14 (×3): 100 mg via ORAL
  Filled 2018-02-12 (×3): qty 2

## 2018-02-12 MED ORDER — AMLODIPINE BESYLATE 10 MG PO TABS
10.0000 mg | ORAL_TABLET | Freq: Every day | ORAL | Status: DC
  Administered 2018-02-12 – 2018-02-15 (×4): 10 mg via ORAL
  Filled 2018-02-12 (×4): qty 1

## 2018-02-12 MED ORDER — METOPROLOL SUCCINATE ER 50 MG PO TB24
50.0000 mg | ORAL_TABLET | Freq: Every evening | ORAL | Status: DC
  Administered 2018-02-12 – 2018-02-14 (×4): 50 mg via ORAL
  Filled 2018-02-12 (×4): qty 1

## 2018-02-12 MED ORDER — SODIUM CHLORIDE 0.9 % IV SOLN
500.0000 mg | INTRAVENOUS | Status: DC
  Administered 2018-02-12: 500 mg via INTRAVENOUS
  Filled 2018-02-12: qty 500

## 2018-02-12 MED ORDER — ACETAMINOPHEN 650 MG RE SUPP: 650.0000 mg | Freq: Four times a day (QID) | RECTAL | Status: DC | PRN

## 2018-02-12 MED ORDER — SENNOSIDES-DOCUSATE SODIUM 8.6-50 MG PO TABS
1.0000 | ORAL_TABLET | Freq: Two times a day (BID) | ORAL | Status: DC
  Administered 2018-02-12 – 2018-02-13 (×2): 1 via ORAL
  Filled 2018-02-12 (×3): qty 1

## 2018-02-12 MED ORDER — NITROGLYCERIN 0.4 MG SL SUBL: 0.4000 mg | SUBLINGUAL_TABLET | SUBLINGUAL | Status: DC | PRN

## 2018-02-12 MED ORDER — SODIUM CHLORIDE 0.9 % IV SOLN
INTRAVENOUS | Status: AC
  Administered 2018-02-12: 01:00:00 via INTRAVENOUS

## 2018-02-12 MED ORDER — AMIODARONE HCL 200 MG PO TABS
200.0000 mg | ORAL_TABLET | Freq: Every day | ORAL | Status: DC
  Administered 2018-02-12 – 2018-02-13 (×2): 200 mg via ORAL
  Filled 2018-02-12 (×2): qty 1

## 2018-02-12 MED ORDER — ENOXAPARIN SODIUM 40 MG/0.4ML ~~LOC~~ SOLN
40.0000 mg | Freq: Every day | SUBCUTANEOUS | Status: DC
  Administered 2018-02-12 – 2018-02-15 (×4): 40 mg via SUBCUTANEOUS
  Filled 2018-02-12 (×4): qty 0.4

## 2018-02-12 MED ORDER — BISACODYL 10 MG RE SUPP: 10.0000 mg | Freq: Every day | RECTAL | Status: DC | PRN

## 2018-02-12 MED ORDER — ONDANSETRON HCL 4 MG/2ML IJ SOLN: 4.0000 mg | Freq: Four times a day (QID) | INTRAMUSCULAR | Status: DC | PRN

## 2018-02-12 MED ORDER — OMEGA-3-ACID ETHYL ESTERS 1 G PO CAPS
1000.0000 mg | ORAL_CAPSULE | Freq: Two times a day (BID) | ORAL | Status: DC
  Administered 2018-02-12 – 2018-02-15 (×7): 1000 mg via ORAL
  Filled 2018-02-12 (×7): qty 1

## 2018-02-12 MED ORDER — ONDANSETRON HCL 4 MG PO TABS: 4.0000 mg | ORAL_TABLET | Freq: Four times a day (QID) | ORAL | Status: DC | PRN

## 2018-02-12 MED ORDER — POLYETHYLENE GLYCOL 3350 17 G PO PACK
17.0000 g | PACK | Freq: Two times a day (BID) | ORAL | Status: DC
  Administered 2018-02-12 – 2018-02-14 (×4): 17 g via ORAL
  Filled 2018-02-12 (×5): qty 1

## 2018-02-12 MED ORDER — GUAIFENESIN-DM 100-10 MG/5ML PO SYRP
10.0000 mL | ORAL_SOLUTION | Freq: Four times a day (QID) | ORAL | Status: DC | PRN
  Administered 2018-02-12 (×2): 10 mL via ORAL
  Filled 2018-02-12 (×2): qty 10

## 2018-02-12 MED ORDER — SODIUM CHLORIDE 0.9 % IV SOLN
1.0000 g | INTRAVENOUS | Status: DC
  Administered 2018-02-12 – 2018-02-13 (×2): 1 g via INTRAVENOUS
  Filled 2018-02-12 (×3): qty 10

## 2018-02-12 MED ORDER — INSULIN ASPART 100 UNIT/ML ~~LOC~~ SOLN
0.0000 [IU] | Freq: Three times a day (TID) | SUBCUTANEOUS | Status: DC
  Administered 2018-02-12: 2 [IU] via SUBCUTANEOUS
  Administered 2018-02-12: 1 [IU] via SUBCUTANEOUS
  Administered 2018-02-12: 3 [IU] via SUBCUTANEOUS

## 2018-02-12 NOTE — Progress Notes (Signed)
CRITICAL VALUE ALERT  Critical Value: Trop 0.07  Date & Time Notied:  02/12/2018 @ 0532  Provider Notified: on call paged at 0533  Orders Received/Actions taken: cont to repeat labs q6hrs

## 2018-02-12 NOTE — Progress Notes (Signed)
Patient was admitted early this morning after midnight and H&P has been reviewed and I am in current agreement with assessment and plan done by Dr. Georgetta HaberArshad.  Additional changes to the plan of care have been made accordingly.  The patient is a 84 year old African-American male with a past medical history significant for CAD status post stenting in July 2018, diabetes mellitus type 2, hypertension, hyperlipidemia and other comorbidities who recently had a fall and found to have left foot drop about a month ago.  At that time patient came to the emergency room and had a head CT which was unremarkable a few days ago patient had episode of nausea vomiting with no abdominal pain while having lunch and he was able to eat supper without difficulty however yesterday woke up from a nap confused and was febrile and had productive cough.  Patient was found to have a fever and brought to the emergency room.  In the ER is found to be febrile and had a lactate level was elevated and was found to have a right midlung pneumonia on chest x-ray.  Labs also showed hyponatremia and elevated LFTs is admitted for further work-up and evaluation.  Placed on Coverage with IV ceftriaxone and azithromycin and cultures were checked.  He was gently hydrated underwent a HIDA scan which was normal.  Was also obtained and urine strep pneumo antigen was negative.  We will continue to monitor patient's clinical response to intervention and repeat blood work and imaging of the chest x-ray in a.m.

## 2018-02-12 NOTE — Progress Notes (Signed)
Pt transported down for US of abdomen.

## 2018-02-12 NOTE — Progress Notes (Signed)
Patient not in room at the time.  Met his wife Alexander Donaldson and gave her AD forms and she knows to call for chaplain when the form is ready and we will provide notary and witnesses.  Empathic listening, prayers for healing from pneumonia and other illness to respond well to treatment and for the medical staff as they care for him and for strength for patient and wife dealing with this illness and related stresses. Conard Novak, Chaplain   02/12/18 0900  Clinical Encounter Type  Visited With Family;Patient not available  Visit Type Initial;Other (Comment) (Advance Directives forms given to wife Alexander Donaldson )  Referral From Physician  Consult/Referral To Chaplain  Spiritual Encounters  Spiritual Needs Brochure;Prayer  Stress Factors  Patient Stress Factors Not reviewed  Family Stress Factors Not reviewed

## 2018-02-12 NOTE — Care Management Note (Addendum)
Case Management Note  Patient Details  Name: Alexander Donaldson MRN: [id number] Date of Birth: 09/20/1933  Subjective/Objective:   From home , presents with CAP, DM2, n/v, hypokalemia, htn, left foot drop, cad, acute encephalopathy.  Will need pt/ot eval.  If recs HH services , daughter would like for patient to work with [facility], and he will need rolling walker, bsc, and a w/chair.                   Action/Plan: NCM will follow for transition of care needs.   Expected Discharge Date:  02/14/18               Expected Discharge Plan:  Home w Home Health Services  In-House Referral:     Discharge planning Services  CM Consult  Post Acute Care Choice:    Choice offered to:     DME Arranged:    DME Agency:     HH Arranged:    HH Agency:     Status of Service:  In process, will continue to follow  If discussed at Long Length of Stay Meetings, dates discussed:    Additional Comments:  [name], Slyvia Lartigue Clinton, RN 02/12/2018, 10:34 AM

## 2018-02-12 NOTE — H&P (Signed)
History and Physical    Alexander Donaldson:[id number] DOB: [birth year] DOA: 02/11/2018  PCP: Dellia Nims, MD  Patient coming from: Home.  Chief Complaint: Confusion.  HPI: Alexander Donaldson is a 84 y.o. male with history of CAD status post stenting in July 2018, diabetes mellitus type 2, hypertension, hyperlipidemia who had recent fall and also was found to have left foot drop almost a month ago and at the time patient had come to the ER and CT head was unremarkable.  The patient's wife noticed foot drop only from July 17.  About 3 weeks ago.  2 days ago patient had an episode of nausea vomiting with no abdominal pain while having lunch.  Patient was able to eat supper without any difficulty.  Yesterday after a nap in the afternoon when patient woke up he looked confused and was febrile and has had a productive cough.  Confusion lasted for a few minutes then resolved.  Patient had fever and was brought to the ER.  ED Course: In the ER patient was febrile and had lactate elevated and chest x-ray was showing pneumonic process.  Labs showed hyponatremia with elevated LFTs but abdomen appears benign.  By the time I examined patient is back to baseline without any confusion and is moving all extremities.  Patient was started on empiric antibiotics for pneumonia and admitted for further management.  Patient also has severe hypokalemia.  Review of Systems: As per HPI, rest all negative.   Past Medical History:  Diagnosis Date  . Coronary artery disease   . Diabetes mellitus   . Diverticulitis   . Hypertension     Past Surgical History:  Procedure Laterality Date  . ANGIOPLASTY    . CORONARY STENT INTERVENTION N/A 02/02/2017   Procedure: Coronary Stent Intervention;  Surgeon: Rinaldo Cloud, MD;  Location: [facility] INVASIVE CV LAB;  Service: Cardiovascular;  Laterality: N/A;  . CORONARY STENTS    . LEFT HEART CATH AND CORONARY ANGIOGRAPHY N/A 02/02/2017   Procedure: Left Heart Cath and Coronary  Angiography;  Surgeon: Rinaldo Cloud, MD;  Location: [facility] INVASIVE CV LAB;  Service: Cardiovascular;  Laterality: N/A;  . LEFT HEART CATHETERIZATION WITH CORONARY ANGIOGRAM N/A 03/16/2012   Procedure: LEFT HEART CATHETERIZATION WITH CORONARY ANGIOGRAM;  Surgeon: Robynn Pane, MD;  Location: [facility] CATH LAB;  Service: Cardiovascular;  Laterality: N/A;  . PERCUTANEOUS CORONARY STENT INTERVENTION (PCI-S)  03/16/2012   Procedure: PERCUTANEOUS CORONARY STENT INTERVENTION (PCI-S);  Surgeon: Robynn Pane, MD;  Location: [facility] CATH LAB;  Service: Cardiovascular;;  . TONSILLECTOMY       reports that he has never smoked. He has never used smokeless tobacco. He reports that he does not drink alcohol or use drugs.  No Known Allergies  History reviewed. No pertinent family history.  Prior to Admission medications   Medication Sig Start Date End Date Taking? Authorizing Provider  amiodarone (PACERONE) 200 MG tablet Take 1 tablet (200 mg total) by mouth daily. 02/04/17  Yes Rinaldo Cloud, MD  amLODipine (NORVASC) 5 MG tablet Take 1 tablet (5 mg total) by mouth daily. Patient taking differently: Take 5 mg by mouth 2 (two) times daily.  02/03/17  Yes Rinaldo Cloud, MD  aspirin 81 MG tablet Take 81 mg by mouth daily.     Yes [provider]  atorvastatin (LIPITOR) 80 MG tablet Take 80 mg by mouth at bedtime.   Yes [provider]  clopidogrel (PLAVIX) 75 MG tablet Take 75 mg by mouth  daily. 01/02/17  Yes [provider]  dextromethorphan (DELSYM) 30 MG/5ML liquid Take 60 mg by mouth 2 (two) times daily as needed for cough.   Yes [provider]  diclofenac sodium (VOLTAREN) 1 % GEL Apply 2 g topically 4 (four) times daily as needed (pain).   Yes [provider]  glipiZIDE (GLUCOTROL) 10 MG tablet Take 10 mg by mouth 2 (two) times daily before a meal. Take 30 minutes before meals   Yes [provider]  hydroxypropyl methylcellulose / hypromellose (ISOPTO TEARS /  GONIOVISC) 2.5 % ophthalmic solution Place 1 drop into both eyes 4 (four) times daily as needed for dry eyes.   Yes [provider]  losartan (COZAAR) 50 MG tablet Take 1 tablet (50 mg total) by mouth daily. Patient taking differently: Take 100 mg by mouth every evening.  02/03/17  Yes Rinaldo Cloud, MD  metoprolol succinate (TOPROL-XL) 25 MG 24 hr tablet Take 50 mg by mouth every evening.  01/09/17  Yes [provider]  nitroGLYCERIN (NITROSTAT) 0.4 MG SL tablet Place 1 tablet (0.4 mg total) under the tongue every 5 (five) minutes x 3 doses as needed for chest pain. 03/18/12 02/11/18 Yes Rinaldo Cloud, MD  Omega-3 Fatty Acids (FISH OIL) 1000 MG CAPS Take 1,000 mg by mouth 2 (two) times daily.   Yes [provider]  psyllium (HYDROCIL/METAMUCIL) 95 % PACK Take 1 packet by mouth 2 (two) times daily.    Yes [provider]  furosemide (LASIX) 20 MG tablet Take 1 tablet (20 mg total) by mouth 2 (two) times daily for 5 days. Patient not taking: Reported on 02/11/2018 01/20/18 01/25/18  Harlene Salts A, PA-C  potassium chloride (K-DUR) 10 MEQ tablet Take 2 tablets (20 mEq total) by mouth daily for 5 days. Patient not taking: Reported on 02/11/2018 01/20/18 01/25/18  Bill Salinas, PA-C    Physical Exam: Vitals:   02/12/18 0000 02/12/18 0025 02/12/18 0033 02/12/18 0300  BP:   (!) 138/59   Pulse: 81  78   Resp: (!) 22     Temp:   98.9 F (37.2 C)   TempSrc:      SpO2: 94%  97%   Weight:  80.9 kg (178 lb 5.6 oz)  80.9 kg (178 lb 5.6 oz)  Height:  5\' 3"  (1.6 m)        Constitutional: Moderately built and nourished. Vitals:   02/12/18 0000 02/12/18 0025 02/12/18 0033 02/12/18 0300  BP:   (!) 138/59   Pulse: 81  78   Resp: (!) 22     Temp:   98.9 F (37.2 C)   TempSrc:      SpO2: 94%  97%   Weight:  80.9 kg (178 lb 5.6 oz)  80.9 kg (178 lb 5.6 oz)  Height:  5\' 3"  (1.6 m)     Eyes: Anicteric no pallor. ENMT: No discharge from the ears eyes nose or  mouth. Neck: No mass felt.  No neck rigidity.  No JVD appreciated. Respiratory: No rhonchi or crepitations. Cardiovascular: S1-S2 heard no murmurs appreciated. Abdomen: Soft nontender bowel sounds present. Musculoskeletal: No edema.  No joint effusion. Skin: No rash.  Skin appears warm. Neurologic: Alert awake oriented to time place and person.  Has left foot drop.  Moves her lower extremity otherwise. Psychiatric: Appears normal.   Labs on Admission: I have personally reviewed following labs and imaging studies  CBC: Recent Labs  Lab 02/11/18 2028 02/12/18 0416  WBC 9.0  7.4  NEUTROABS 6.3 4.9  HGB 12.8* 11.9*  HCT 38.0* 34.8*  MCV 90.0 89.2  PLT 199 163   Basic Metabolic Panel: Recent Labs  Lab 02/11/18 2028 02/12/18 0416  NA 128* 136  K 2.9* 2.8*  CL 91* 98  CO2 26 27  GLUCOSE 196* 162*  BUN 11 8  CREATININE 1.07 0.98  CALCIUM 8.2* 7.9*   GFR: Estimated Creatinine Clearance: 54.7 mL/min (by C-G formula based on SCr of 0.98 mg/dL). Liver Function Tests: Recent Labs  Lab 02/11/18 2028 02/12/18 0416  AST 83* 80*  ALT 108* 99*  ALKPHOS 137* 128*  BILITOT 1.7* 1.3*  PROT 7.0 6.2*  ALBUMIN 3.4* 2.9*   No results for input(s): LIPASE, AMYLASE in the last 168 hours. No results for input(s): AMMONIA in the last 168 hours. Coagulation Profile: No results for input(s): INR, PROTIME in the last 168 hours. Cardiac Enzymes: Recent Labs  Lab 02/12/18 0416  TROPONINI 0.07*   BNP (last 3 results) No results for input(s): PROBNP in the last 8760 hours. HbA1C: No results for input(s): HGBA1C in the last 72 hours. CBG: Recent Labs  Lab 02/11/18 2027  GLUCAP 188*   Lipid Profile: No results for input(s): CHOL, HDL, LDLCALC, TRIG, CHOLHDL, LDLDIRECT in the last 72 hours. Thyroid Function Tests: No results for input(s): TSH, T4TOTAL, FREET4, T3FREE, THYROIDAB in the last 72 hours. Anemia Panel: No results for input(s): VITAMINB12, FOLATE, FERRITIN, TIBC, IRON,  RETICCTPCT in the last 72 hours. Urine analysis:    Component Value Date/Time   COLORURINE YELLOW 02/11/2018 1820   APPEARANCEUR CLEAR 02/11/2018 1820   LABSPEC 1.015 02/11/2018 1820   PHURINE 6.0 02/11/2018 1820   GLUCOSEU 150 (A) 02/11/2018 1820   HGBUR MODERATE (A) 02/11/2018 1820   BILIRUBINUR NEGATIVE 02/11/2018 1820   KETONESUR 5 (A) 02/11/2018 1820   PROTEINUR 100 (A) 02/11/2018 1820   NITRITE NEGATIVE 02/11/2018 1820   LEUKOCYTESUR NEGATIVE 02/11/2018 1820   Sepsis Labs: @LABRCNTIP (procalcitonin:4,lacticidven:4) )No results found for this or any previous visit (from the past 240 hour(s)).   Radiological Exams on Admission: Dg Chest 2 View  Result Date: 02/11/2018 CLINICAL DATA:  Acute onset of confusion and fever. Urinary incontinence. EXAM: CHEST - 2 VIEW COMPARISON:  Chest radiograph performed 01/11/2018 FINDINGS: New right midlung airspace opacification is compatible with pneumonia. No pleural effusion or pneumothorax is seen. The heart is normal in size. No acute osseous abnormalities are identified. There is chronic superior subluxation of both humeral heads, reflecting chronic rotator cuff tears. Calcification is noted along the abdominal aorta. IMPRESSION: New right midlung airspace opacification is compatible with pneumonia. Electronically Signed   By: Roanna Raider M.D.   On: 02/11/2018 21:04   US Abdomen Complete  Result Date: 02/12/2018 CLINICAL DATA:  Acute onset of nausea and vomiting. EXAM: ABDOMEN ULTRASOUND COMPLETE COMPARISON:  None. FINDINGS: Gallbladder: Stones are noted within the gallbladder, measuring up to 8 mm in size. The gallbladder wall is borderline prominent. No ultrasonographic Murphy's sign is elicited. No pericholecystic fluid is seen. Common bile duct: Diameter: 0.3 cm, within normal limits in caliber. Liver: No focal lesion identified. Within normal limits in parenchymal echogenicity. Portal vein is patent on color Doppler imaging with normal  direction of blood flow towards the liver. IVC: No abnormality visualized. Pancreas: Not well characterized due to overlying bowel gas. Spleen: Size and appearance within normal limits. Right Kidney: Length: 11.0 cm. Echogenicity within normal limits. No mass or hydronephrosis visualized. Left Kidney: Length: 10.2 cm. Echogenicity  within normal limits. A 4.0 cm cyst is partially imaged at the left kidney. No hydronephrosis visualized. Abdominal aorta: No aneurysm visualized. Other findings: None. IMPRESSION: 1. No acute abnormality seen to explain the patient's symptoms. 2. Cholelithiasis. Borderline prominence of the gallbladder wall may reflect chronic inflammation. No definite evidence of cholecystitis. 3. 4.0 cm left renal cyst noted. Electronically Signed   By: Roanna RaiderJeffery  Chang M.D.   On: 02/12/2018 04:04    EKG: Independently reviewed.  Normal sinus rhythm with PVCs.  Assessment/Plan Principal Problem:   CAP (community acquired pneumonia) Active Problems:   Acute encephalopathy   CAD (coronary artery disease)   Left foot drop   Type 2 diabetes mellitus with vascular disease (HCC)   Essential hypertension   Elevated LFTs   Nausea & vomiting   Hypokalemia    1. Community-acquired pneumonia -patient is placed on ceftriaxone and Zithromax follow cultures check urine for Legionella and strep antigen.  Continue hydration. 2. Acute encephalopathy likely from fever and pneumonia.  Improved and resolved.  Since patient's LFTs are elevated we will check ammonia levels.  Will check CT head. 3. Left foot drop has been ongoing for last 1 month.  Discussed with neurologist Dr. Jerrell BelfastAurora advised to get EMG as outpatient at around 6 weeks from the onset. 4. Elevated LFTs -abdomen appears benign.  But since patient had nausea vomiting we will check sonogram of the abdomen.  If LFTs continues to increase may have to hold amiodarone and statins.  Check acute hepatitis panel since [place]. 5. Diabetes mellitus type 2 we will keep patient on sliding scale coverage. 6. Hypokalemia likely from nausea vomiting and use of diuretics.  Would hold diuretics gently hydrate replace potassium check magnesium. 7. History of CAD status post stenting we will check cardiac markers denies any chest pain.  On antiplatelet agents.  Follows with Dr. Sharyn LullHarwani. 8. Hypertension on ARB amlodipine and metoprolol. 9. Normocytic normochromic anemia -follow CBC.   DVT prophylaxis: Lovenox. Code Status: Full code. Family Communication: Patient's wife. Disposition Plan: Home. Consults called: Physical therapy.  Discussed with neurologist. Admission status: Inpatient.   Eduard ClosArshad N Nettie Cromwell MD [facility] Pager [phone].  If 7PM-7AM, please contact night-coverage [email or web address] Password [email or web address]  02/12/2018, 6:26 AM

## 2018-02-13 ENCOUNTER — Inpatient Hospital Stay (HOSPITAL_COMMUNITY): Payer: Medicare Other

## 2018-02-13 LAB — CBC WITH DIFFERENTIAL/PLATELET
ABS IMMATURE GRANULOCYTES: 0 10*3/uL (ref 0.0–0.1)
Basophils Absolute: 0 10*3/uL (ref 0.0–0.1)
Basophils Relative: 0 %
Eosinophils Absolute: 0.1 10*3/uL (ref 0.0–0.7)
Eosinophils Relative: 1 %
HCT: 32.9 % — ABNORMAL LOW (ref 39.0–52.0)
Hemoglobin: 11.3 g/dL — ABNORMAL LOW (ref 13.0–17.0)
IMMATURE GRANULOCYTES: 1 %
Lymphocytes Relative: 16 %
Lymphs Abs: 1.3 10*3/uL (ref 0.7–4.0)
MCH: 30.6 pg (ref 26.0–34.0)
MCHC: 34.3 g/dL (ref 30.0–36.0)
MCV: 89.2 fL (ref 78.0–100.0)
MONOS PCT: 14 %
Monocytes Absolute: 1.2 10*3/uL — ABNORMAL HIGH (ref 0.1–1.0)
NEUTROS ABS: 5.7 10*3/uL (ref 1.7–7.7)
NEUTROS PCT: 68 %
Platelets: 188 10*3/uL (ref 150–400)
RBC: 3.69 MIL/uL — AB (ref 4.22–5.81)
RDW: 12.7 % (ref 11.5–15.5)
WBC: 8.3 10*3/uL (ref 4.0–10.5)

## 2018-02-13 LAB — HEPATITIS PANEL, ACUTE
HCV AB: 0.3 {s_co_ratio} (ref 0.0–0.9)
HEP A IGM: NEGATIVE
HEP B S AG: NEGATIVE
Hep B C IgM: NEGATIVE

## 2018-02-13 LAB — GLUCOSE, CAPILLARY
GLUCOSE-CAPILLARY: 132 mg/dL — AB (ref 70–99)
GLUCOSE-CAPILLARY: 156 mg/dL — AB (ref 70–99)
GLUCOSE-CAPILLARY: 155 mg/dL — AB (ref 70–99)
Glucose-Capillary: 170 mg/dL — ABNORMAL HIGH (ref 70–99)

## 2018-02-13 LAB — LIPASE, BLOOD: Lipase: 46 U/L (ref 11–51)

## 2018-02-13 LAB — TROPONIN I
TROPONIN I: 0.07 ng/mL — AB (ref <0.03)
Troponin I: 0.05 ng/mL (ref <0.03)
Troponin I: 0.05 ng/mL (ref <0.03)

## 2018-02-13 LAB — COMPREHENSIVE METABOLIC PANEL
ALBUMIN: 2.6 g/dL — AB (ref 3.5–5.0)
ALT: 106 U/L — AB (ref 0–44)
AST: 88 U/L — AB (ref 15–41)
Alkaline Phosphatase: 153 U/L — ABNORMAL HIGH (ref 38–126)
Anion gap: 11 (ref 5–15)
BUN: 9 mg/dL (ref 8–23)
CHLORIDE: 101 mmol/L (ref 98–111)
CO2: 25 mmol/L (ref 22–32)
CREATININE: 1 mg/dL (ref 0.61–1.24)
Calcium: 7.7 mg/dL — ABNORMAL LOW (ref 8.9–10.3)
GFR calc Af Amer: >60 mL/min (ref >60)
GFR calc non Af Amer: >60 mL/min (ref >60)
GLUCOSE: 149 mg/dL — AB (ref 70–99)
POTASSIUM: 3.8 mmol/L (ref 3.5–5.1)
SODIUM: 137 mmol/L (ref 135–145)
Total Bilirubin: 1.5 mg/dL — ABNORMAL HIGH (ref 0.3–1.2)
Total Protein: 5.8 g/dL — ABNORMAL LOW (ref 6.5–8.1)

## 2018-02-13 LAB — LEGIONELLA PNEUMOPHILA SEROGP 1 UR AG: L. PNEUMOPHILA SEROGP 1 UR AG: POSITIVE — AB

## 2018-02-13 LAB — PHOSPHORUS: PHOSPHORUS: 1.9 mg/dL — AB (ref 2.5–4.6)

## 2018-02-13 LAB — MAGNESIUM: Magnesium: 1.9 mg/dL (ref 1.7–2.4)

## 2018-02-13 MED ORDER — IPRATROPIUM-ALBUTEROL 0.5-2.5 (3) MG/3ML IN SOLN: 3.0000 mL | Freq: Four times a day (QID) | RESPIRATORY_TRACT | Status: DC

## 2018-02-13 MED ORDER — IPRATROPIUM-ALBUTEROL 0.5-2.5 (3) MG/3ML IN SOLN
RESPIRATORY_TRACT | Status: AC
  Filled 2018-02-13: qty 3

## 2018-02-13 MED ORDER — K PHOS MONO-SOD PHOS DI & MONO 155-852-130 MG PO TABS
500.0000 mg | ORAL_TABLET | Freq: Two times a day (BID) | ORAL | Status: AC
  Administered 2018-02-13 (×2): 500 mg via ORAL
  Filled 2018-02-13 (×2): qty 2

## 2018-02-13 MED ORDER — SODIUM CHLORIDE 0.9 % IV SOLN
500.0000 mg | INTRAVENOUS | Status: DC
  Administered 2018-02-13 – 2018-02-14 (×2): 500 mg via INTRAVENOUS
  Filled 2018-02-13 (×2): qty 500

## 2018-02-13 MED ORDER — INSULIN ASPART 100 UNIT/ML ~~LOC~~ SOLN
0.0000 [IU] | Freq: Three times a day (TID) | SUBCUTANEOUS | Status: DC
  Administered 2018-02-13: 2 [IU] via SUBCUTANEOUS
  Administered 2018-02-13: 1 [IU] via SUBCUTANEOUS
  Administered 2018-02-13 – 2018-02-14 (×2): 2 [IU] via SUBCUTANEOUS
  Administered 2018-02-14: 1 [IU] via SUBCUTANEOUS
  Administered 2018-02-14: 3 [IU] via SUBCUTANEOUS
  Administered 2018-02-15: 2 [IU] via SUBCUTANEOUS
  Administered 2018-02-15: 3 [IU] via SUBCUTANEOUS

## 2018-02-13 MED ORDER — IPRATROPIUM-ALBUTEROL 0.5-2.5 (3) MG/3ML IN SOLN
3.0000 mL | Freq: Three times a day (TID) | RESPIRATORY_TRACT | Status: DC
  Administered 2018-02-13 – 2018-02-15 (×8): 3 mL via RESPIRATORY_TRACT
  Filled 2018-02-13 (×8): qty 3

## 2018-02-13 MED ORDER — ATORVASTATIN CALCIUM 40 MG PO TABS
40.0000 mg | ORAL_TABLET | Freq: Every day | ORAL | Status: DC
  Administered 2018-02-13 – 2018-02-14 (×2): 40 mg via ORAL
  Filled 2018-02-13 (×2): qty 1

## 2018-02-13 MED ORDER — AZITHROMYCIN 250 MG PO TABS: 500.0000 mg | ORAL_TABLET | Freq: Every day | ORAL | Status: DC

## 2018-02-13 MED ORDER — GUAIFENESIN ER 600 MG PO TB12
1200.0000 mg | ORAL_TABLET | Freq: Two times a day (BID) | ORAL | Status: DC
  Administered 2018-02-13 – 2018-02-15 (×5): 1200 mg via ORAL
  Filled 2018-02-13 (×5): qty 2

## 2018-02-13 NOTE — Evaluation (Signed)
Occupational Therapy Evaluation Patient Details Name: Alexander Donaldson MRN: [id number] DOB: 28-Oct-1933 Today's Date: 02/13/2018    History of Present Illness Alexander Donaldson is a 84 y.o. male with history of CAD status post stenting in July 2018, diabetes mellitus type 2, hypertension, hyperlipidemia who had recent fall and also was found to have left foot drop almost a month ago and at the time patient had come to the ER and CT head was unremarkable.  The patient's wife noticed foot drop only from July 17. In the ER patient was febrile and had lactate elevated and chest x-ray was showing pneumonic process.   Clinical Impression   Pt currently min assist for selfcare tasks sit to stand and for functional transfers to and from the toilet.  Still with some slight confusion as well, not able to state that he was at "[facility]" and forgetting to pull his pants up after donning them over his feet before attempting to ambulate.  Wife reports she has been noticing some confusion lately, not just with this admission.  Feel he will benefit from acute care OT to progress back to modified independent level for home.  Will benefit from 3:1 before discharge and she will look into getting a shower seat on her own.      Follow Up Recommendations  No OT follow up;Supervision - Intermittent    Equipment Recommendations  3 in 1 bedside commode       Precautions / Restrictions Precautions Precautions: Fall Restrictions Weight Bearing Restrictions: No      Mobility Bed Mobility Overal bed mobility: Modified Independent                Transfers Overall transfer level: Needs assistance Equipment used: 1 person hand held assist Transfers: Sit to/from Stand Sit to Stand: Min assist         General transfer comment: Min assist for balance with initial sit to stand and with functional mobility as he fatigued.    Balance Overall balance assessment: Needs assistance Sitting-balance support: Feet  unsupported Sitting balance-Leahy Scale: Good       Standing balance-Leahy Scale: Fair Standing balance comment: LOB with dynamic standing activities but could maintain balance with static standing.                           ADL either performed or assessed with clinical judgement   ADL Overall ADL's : Needs assistance/impaired Eating/Feeding: Independent   Grooming: Standing;Supervision/safety   Upper Body Bathing: Set up;Sitting   Lower Body Bathing: Minimal assistance;Sit to/from stand   Upper Body Dressing : Set up;Sitting   Lower Body Dressing: Minimal assistance;Sit to/from stand   Toilet Transfer: Minimal assistance;Ambulation   Toileting- Clothing Manipulation and Hygiene: Minimal assistance;Sit to/from stand       Functional mobility during ADLs: Minimal assistance General ADL Comments: Pt with increasd dyspnea with functional mobility and O2 sats decreasing from 93 % on 3Ls at rest to 87% on room air.  He demonstrated LOB posteriorly with initial sit to stand as well as LOB to the right with mobility.  Both required min assist to correct.   Pt with recent history of left foot drop as well, but improving.     Vision Baseline Vision/History: Wears glasses Wears Glasses: At all times Patient Visual Report: No change from baseline Vision Assessment?: No apparent visual deficits            Pertinent Vitals/Pain Pain Assessment:  Faces Faces Pain Scale: Hurts a little bit Pain Location: left hip Pain Descriptors / Indicators: Discomfort Pain Intervention(s): Limited activity within patient's tolerance;Monitored during session        Extremity/Trunk Assessment Upper Extremity Assessment Upper Extremity Assessment: LUE deficits/detail;RUE deficits/detail RUE Deficits / Details: History of rotator cuff injury.  AROM shoulder flexion 0-100 degrees with all other joints AROM WFLs and strength 4/5 for biceps and grip. RUE Sensation: WNL LUE Deficits /  Details: History of rotator cuff injury.  AROM shoulder flexion 0-100 degrees with all other joints AROM WFLs and strength 4/5 for biceps and grip. LUE Sensation: WNL   Lower Extremity Assessment Lower Extremity Assessment: Defer to PT evaluation   Cervical / Trunk Assessment Cervical / Trunk Assessment: Normal   Communication Communication Communication: No difficulties   Cognition Arousal/Alertness: Awake/alert Behavior During Therapy: WFL for tasks assessed/performed Overall Cognitive Status: Impaired/Different from baseline Area of Impairment: Orientation;Memory;Problem solving                 Orientation Level: Place;Time(Pt unable to state [place] until he was asked "where did you wife work?".)             General Comments: Pt donned pull up shorts over his feet, but once standing he did not remember he needed to pull them up, while dividing attention and talking with the therapist at the same time.    General Comments               Home Living Family/patient expects to be discharged to:: Private residence Living Arrangements: Spouse/significant other Available Help at Discharge: Available 24 hours/day;Family Type of Home: House Home Access: Stairs to enter [employer] of Steps: 2 Entrance Stairs-Rails: None(Plan to add rails) Home Layout: One level     Bathroom Shower/Tub: Chief Strategy Officer: Standard Bathroom Accessibility: Yes How Accessible: Accessible via walker Home Equipment: Cane - single point          Prior Functioning/Environment Level of Independence: Independent                 OT Problem List: Decreased strength;Decreased cognition;Impaired balance (sitting and/or standing);Decreased activity tolerance;Cardiopulmonary status limiting activity      OT Treatment/Interventions: Self-care/ADL training;Patient/family education;Balance training;Therapeutic activities;DME and/or AE instruction;Cognitive  remediation/compensation;Energy conservation    OT Goals(Current goals can be found in the care plan section) Acute Rehab OT Goals Patient Stated Goal: To get his balance better OT Goal Formulation: With patient/family Time For Goal Achievement: 02/27/18 Potential to Achieve Goals: Good  OT Frequency: Min 2X/week   Barriers to D/C:               AM-PAC PT "6 Clicks" Daily Activity     Outcome Measure Help from another person eating meals?: None Help from another person taking care of personal grooming?: A Little Help from another person toileting, which includes using toliet, bedpan, or urinal?: A Little Help from another person bathing (including washing, rinsing, drying)?: A Little Help from another person to put on and taking off regular upper body clothing?: None Help from another person to put on and taking off regular lower body clothing?: A Little 6 Click Score: 20   End of Session Equipment Utilized During Treatment: Gait belt Nurse Communication: Mobility status  Activity Tolerance: Patient tolerated treatment well Patient left: in bed;with call bell/phone within reach;with family/visitor present  OT Visit Diagnosis: Unsteadiness on feet (R26.81);Repeated falls (R29.6);Muscle weakness (generalized) (M62.81)  Time: [id number] OT Time Calculation (min): 33 min Charges:  OT General Charges $OT Visit: 1 Visit OT Evaluation $OT Eval Moderate Complexity: 1 Mod OT Treatments $Self Care/Home Management : 8-22 mins  Liseth Wann OTR/L 02/13/2018, 8:57 AM

## 2018-02-13 NOTE — Evaluation (Addendum)
Physical Therapy Evaluation Patient Details Name: Alexander Donaldson MRN: [id number] DOB: 12/26/1933 Today's Date: 02/13/2018   History of Present Illness  Pt is an 84 y.o. male admitted 02/11/18 with cough, fever, confusion; noted to have R middle lobe pneumonia. CT negative for acute abnromality. Of note, pt with recent fall, found to have L foot drop, but CT unremarkable at that time (01/11/18). PMH includes CAD, DM2, HTN, HLD.    Clinical Impression  Pt presents with an overall decrease in functional mobility secondary to above. PTA, pt indep and lives with wife; wife and son plan to be available for 24/7 support upon pt's return home. Today, pt ambulated 150' with SPC and min guard for balance; stability improved with UE support and use of L-foot AFO. SpO2 down to 88% on RA. Pt would benefit from continued acute PT services to maximize functional mobility and independence prior to d/c with HHPT services.     Follow Up Recommendations Home health PT;Supervision for mobility/OOB    Equipment Recommendations  3in1 (PT)    Recommendations for Other Services       Precautions / Restrictions Precautions Precautions: Fall Precaution Comments: L foot drop (has AFO) Required Braces or Orthoses: Other Brace/Splint Other Brace/Splint: AFO Restrictions Weight Bearing Restrictions: No      Mobility  Bed Mobility Overal bed mobility: Modified Independent             General bed mobility comments: Received sitting in recliner  Transfers Overall transfer level: Needs assistance Equipment used: None Transfers: Sit to/from Stand Sit to Stand: Min guard         General transfer comment: Min assist for balance with initial sit to stand and with functional mobility as he fatigued.  Ambulation/Gait Ambulation/Gait assistance: Min guard Gait Distance (Feet): 150 Feet Assistive device: Straight cane Gait Pattern/deviations: Step-through pattern;Decreased stride length Gait velocity:  Decreased Gait velocity interpretation: <1.8 ft/sec, indicate of risk for recurrent falls General Gait Details: Slow, slightly unsteady amb with SPC and min guard for balance; pt reports amb more comfortable with AFO donned; intermittent instability, but pt able to self-correct. Less stable amb with no UE support  Stairs            Wheelchair Mobility    Modified Rankin (Stroke Patients Only)       Balance Overall balance assessment: Needs assistance Sitting-balance support: Feet unsupported Sitting balance-Leahy Scale: Good       Standing balance-Leahy Scale: Fair Standing balance comment: Can static stand and amb with no UE support and close min guard; stability improved with UE support                             Pertinent Vitals/Pain Pain Assessment: Faces Faces Pain Scale: Hurts a little bit Pain Location: left hip Pain Descriptors / Indicators: Discomfort Pain Intervention(s): Limited activity within patient's tolerance    Home Living Family/patient expects to be discharged to:: Private residence Living Arrangements: Spouse/significant other Available Help at Discharge: Available 24 hours/day;Family Type of Home: House Home Access: Stairs to enter Entrance Stairs-Rails: None(plan to add rails) Entrance Stairs-Number of Steps: 2 Home Layout: One level Home Equipment: Cane - single point      Prior Function Level of Independence: Independent               Hand Dominance        Extremity/Trunk Assessment   Upper Extremity Assessment Upper Extremity Assessment: RUE deficits/detail;LUE  deficits/detail RUE Deficits / Details: History of rotator cuff injury.  AROM shoulder flexion 0-100 degrees with all other joints AROM WFLs and strength 4/5 for biceps and grip. RUE Sensation: WNL LUE Deficits / Details: History of rotator cuff injury.  AROM shoulder flexion 0-100 degrees with all other joints AROM WFLs and strength 4/5 for biceps and  grip. LUE Sensation: WNL    Lower Extremity Assessment Lower Extremity Assessment: LLE deficits/detail LLE Deficits / Details: L foot drop; hip flex 4/5, knee ext 5/5, knee flex 4/5    Cervical / Trunk Assessment Cervical / Trunk Assessment: Normal  Communication   Communication: No difficulties  Cognition Arousal/Alertness: Awake/alert Behavior During Therapy: WFL for tasks assessed/performed Overall Cognitive Status: Impaired/Different from baseline Area of Impairment: Orientation;Memory;Problem solving;Attention                 Orientation Level: Place;Time;Disoriented to Current Attention Level: Selective Memory: Decreased short-term memory         General Comments: Poor attention and difficulty multi-tasking      General Comments General comments (skin integrity, edema, etc.): Son present during session    Exercises     Assessment/Plan    PT Assessment Patient needs continued PT services  PT Problem List Decreased strength;Decreased activity tolerance;Decreased balance;Decreased mobility;Decreased cognition;Decreased knowledge of use of DME       PT Treatment Interventions DME instruction;Gait training;Stair training;Functional mobility training;Therapeutic activities;Therapeutic exercise;Balance training;Patient/family education    PT Goals (Current goals can be found in the Care Plan section)  Acute Rehab PT Goals Patient Stated Goal: Return home and get better balance PT Goal Formulation: With patient Time For Goal Achievement: 02/27/18 Potential to Achieve Goals: Good    Frequency Min 3X/week   Barriers to discharge        Co-evaluation               AM-PAC PT "6 Clicks" Daily Activity  Outcome Measure Difficulty turning over in bed (including adjusting bedclothes, sheets and blankets)?: None Difficulty moving from lying on back to sitting on the side of the bed? : None Difficulty sitting down on and standing up from a chair with  arms (e.g., wheelchair, bedside commode, etc,.)?: A Little Help needed moving to and from a bed to chair (including a wheelchair)?: A Little Help needed walking in hospital room?: A Little Help needed climbing 3-5 steps with a railing? : A Little 6 Click Score: 20    End of Session Equipment Utilized During Treatment: Gait belt;Other (comment)(L AFO) Activity Tolerance: Patient tolerated treatment well Patient left: in chair;with call bell/phone within reach;with family/visitor present Nurse Communication: Mobility status PT Visit Diagnosis: Other abnormalities of gait and mobility (R26.89)    Time: 1610-9604 PT Time Calculation (min) (ACUTE ONLY): 20 min   Charges:   PT Evaluation $PT Eval Moderate Complexity: 1 Mod         Ina Homes, PT, DPT Acute Rehab Services  Pager: [phone]  Malachy Chamber 02/13/2018, 12:34 PM

## 2018-02-13 NOTE — Progress Notes (Signed)
Orthopedic Tech Progress Note Patient Details:  Adela GlimpseHoward L Kalata 03/16/1934 [id number]  Patient ID: Adela GlimpseHoward L Govoni, male   DOB: 12/23/1933, 84 y.o.   MRN: [id number]   Saul FordyceJennifer C Yitzel Shasteen 02/13/2018, 9:12 AMCalled [facility] for left ankle foot orthosis.

## 2018-02-13 NOTE — Progress Notes (Signed)
Alexander Donaldson  ZOX:[id number] DOB: March 31, 1934 DOA: 02/11/2018 PCP: Dellia Nims, MD   Brief Narrative:  The patient is a 84 year old African-American male with a past medical history significant for CAD status post stenting in July 2018, diabetes mellitus type 2, hypertension, hyperlipidemia and other comorbidities who recently had a fall and found to have left foot drop about a month ago.  At that time patient came to the emergency room and had a head CT which was unremarkable a few days ago patient had episode of nausea vomiting with no abdominal pain while having lunch and he was able to eat supper without difficulty however yesterday woke up from a nap confused and was febrile and had productive cough.  Patient was found to have a fever and brought to the emergency room.  In the ER he was found to be febrile and had a lactate level was elevated and was found to have a right midlung pneumonia on chest x-ray.  Labs also showed hyponatremia and elevated LFTs is admitted for further work-up and evaluation.  Placed on Antibiotic Coverage with IV ceftriaxone and azithromycin and cultures were checked.  He was gently hydrated underwent a HIDA scan which was normal.  Currently being treated for PNA and found to have Elevated LFT's which is being worked up.   Assessment & Plan:   Principal Problem:   CAP (community acquired pneumonia) Active Problems:   Acute encephalopathy   CAD (coronary artery disease)   Left foot drop   Type 2 diabetes mellitus with vascular disease (HCC)   Essential hypertension   Elevated LFTs   Nausea & vomiting   Hypokalemia  RUL CAP poA -Admitted to telemetry -Patient was placed on IV ceftriaxone and IV azithromycin and azithromycin has been changed to p.o. -Follow cultures and obtain blood cultures and sputum culture -Urine was checked for Legionella's and strep antigens were both were negative -Continued IV fluid hydration now  discontinued -Continue with duo nebs 3 mL's 3 times daily -Repeat chest x-ray this morning showed Persistent right upper lobe pneumonia with slightly increased parenchymal consolidation noted. No significant changes observed elsewhere.  Acute Encephalopathy likely from fever pneumonia with confusion -Improved -Patient's wife states that she has been more confused lately and question the patient has beginning stages of dementia -Head CT was checked no intracranial hemorrhage or CT evidence of large acute infarct, chronic microvascular changes, and mild to moderate global atrophy -Ammonia level on admission was 35 -Continue to Treat Infection and if not Improving will need outpatient evaluation for Dementia with Neurology   Left Foot Drop -Has been persisting for at least a month -Ordered AFO brace -PT/OT to evaluate and treat; PT recommending Home Health PT -Discussed case with neurology who recommended outpatient follow-up and EMG nerve conduction study  Elevated Troponin -Troponin went from 0.07 -> 0.07 -> 0.12 -> 0.07 -Denies any CP and no EKG Changes -Cardiology consulted and feel as if it is demand ischemia in the setting of PNA  CAD s/p Stenting -Followed by Cardiology Dr. Sharyn Lull -C/w Aspirin 81 mg p.o. daily, Atorvastatin 40 mg p.o. daily, Clopidogrel 75 mg p.o. daily, Losartan 100 mg p.o. nightly, Metoprolol succinate 50 mg p.o. nightly, Nitroglycerin 0.4 mg sublingual every 5 minutes as needed chest pain  Hypokalemia -Patient's potassium on admission was 2.8 and now was been replete and is 3.8 -Hypokalemia was likley from N/V and use of Diuretics  -Continue monitor replete as necessary -Repeat CMP in a.m.  HTN -Continue with Amlodipine 10 mg p.o. daily along with Losartan 100 mg p.o. nightly and Metoprolol Succinate 50 mg p.o. nightly  HLD -FLP being checked in AM  -Patient's Atorvastatin 80 mg p.o. daily is being reduced down to 40 mg p.o. daily per  Cardiology -Continue with Omega-3 acid ethyl esters 2000 g p.o. twice daily  Abnormal LFTs/elevated LFTs -AST and ALT both elevated; AST is 88, and ALT is 106 -Acute Hepatitis panel was negative -Ultrasound of the abdomen showed no acute abnormality seen to explain patient's symptoms.  There is also cholelithiasis seen and borderline prominence of the gallbladder which may reflect chronic inflammation but no definite evidence of cholecystitis.  There is also 4 cm renal cyst noted -NM Hepato w Eject Fraction Study was wnL -Likely drug-induced in the setting of Amiodarone and Statin  -Cardiology Discontinuing Amiodarone and reducing Statin dose -Continue to Monitor and Trend LFT's -Repeat CMP in AM   Diabetes Mellitus Type 2 -CBG's ranging from 132-170 -Check HbA1c -C/w Novolog SSI AC  Normocytic Normochromic Anemia -Hb/Hct went from 12.8/38.0 on Admission to 11.3/32.9 -Likley Dilutional Drop from IVF Hydration -Check Anemia Panel in AM  -Continue to Monitor for S/Sx of Bleeding -Repeat CBC in AM   DVT prophylaxis: Enoxaparin 40 mg sq q12h Code Status: FULL CODE Family Communication:  Discussed with wife at bedside  Disposition Plan: Remain Inpatient for continued workup and Treatment; Obtain PT Evaluation  Consultants:   Cardiology Dr. Rinaldo Cloud    Procedures:    Antimicrobials:  Anti-infectives (From admission, onward)   Start     Dose/Rate Route Frequency Ordered Stop   02/13/18 2200  azithromycin (ZITHROMAX) tablet 500 mg     500 mg Oral Daily at bedtime 02/13/18 0937 02/18/18 2159   02/12/18 2000  cefTRIAXone (ROCEPHIN) 1 g in sodium chloride 0.9 % 100 mL IVPB     1 g 200 mL/hr over 30 Minutes Intravenous Every 24 hours 02/12/18 0047 02/19/18 1959   02/12/18 2000  azithromycin (ZITHROMAX) 500 mg in sodium chloride 0.9 % 250 mL IVPB  Status:  Discontinued     500 mg 250 mL/hr over 60 Minutes Intravenous Every 24 hours 02/12/18 0047 02/13/18 0936   02/11/18 2230   cefTRIAXone (ROCEPHIN) 1 g in sodium chloride 0.9 % 100 mL IVPB     1 g 200 mL/hr over 30 Minutes Intravenous  Once 02/11/18 2225 02/11/18 2347   02/11/18 2230  azithromycin (ZITHROMAX) 500 mg in sodium chloride 0.9 % 250 mL IVPB     500 mg 250 mL/hr over 60 Minutes Intravenous  Once 02/11/18 2225 02/12/18 0050     Subjective: And examined at bedside and states that his breathing was a little bit better and was coughing up some productive sputum.  No chest pain, lightheadedness or dizziness.  States that he has some mild lower extremity edema.  Feels little bit better however he states his back was sore slightly from laying in bed.  No other concerns or complaints at this time  Objective: Vitals:   02/13/18 0248 02/13/18 0708 02/13/18 0804 02/13/18 0815  BP:  (!) 141/76    Pulse:  82 87 89  Resp:  20 20   Temp:  98.6 F (37 C)    TempSrc:  Oral    SpO2: 94% 97% 97% 93%  Weight:      Height:        Intake/Output Summary (Last 24 hours) at 02/13/2018 1153 Last data filed at 02/13/2018 0800  Gross per 24 hour  Intake 3247.08 ml  Output 950 ml  Net 2297.08 ml   Filed Weights   02/12/18 0025 02/12/18 0300 02/13/18 0246  Weight: 80.9 kg (178 lb 5.6 oz) 80.9 kg (178 lb 5.6 oz) 88.2 kg (194 lb 7.1 oz)   Examination: Physical Exam:  Constitutional: WN/WD obese AAM in NAD and appears calm and comfortable Eyes: Lids and conjunctivae normal, sclerae anicteric  ENMT: External Ears, Nose appear normal. Grossly normal hearing. Mucous membranes are moist. Neck: Appears normal, supple, no cervical masses, normal ROM, no appreciable thyromegaly, no JVD Respiratory: Diminished to auscultation bilaterall with coarse breath sounds and some rhonchi on the Right; No appreiciable no wheezing, rales or crackles. Normal respiratory effort and patient is not tachypenic. No accessory muscle use but is wearing supplemental O2 via [place] Cardiovascular: RRR, Has a 2/6 Systolic Murmur. S1 and S2 auscultated.  Trace extremity edema.  Abdomen: Soft, non-tender, non-distended. No masses palpated. No appreciable hepatosplenomegaly. Bowel sounds positive x4.  GU: Deferred. Musculoskeletal: No clubbing / cyanosis of digits/nails. Left foot in Brace Skin: No rashes, lesions, ulcers on a limited skin evaluation. No induration; Warm and dry.  Neurologic: CN 2-12 grossly intact with no focal deficits. Romberg sign and cerebellar reflexes not assessed.  Psychiatric: Normal judgment and insight. Alert and oriented x 3. Normal mood and appropriate affect.   Data Reviewed: I have personally reviewed following labs and imaging studies  CBC: Recent Labs  Lab 02/11/18 2028 02/12/18 0416 02/13/18 0335  WBC 9.0 7.4 8.3  NEUTROABS 6.3 4.9 5.7  HGB 12.8* 11.9* 11.3*  HCT 38.0* 34.8* 32.9*  MCV 90.0 89.2 89.2  PLT 199 163 188   Basic Metabolic Panel: Recent Labs  Lab 02/11/18 2028 02/12/18 0416 02/12/18 1224 02/13/18 0335  NA 128* 136  --  137  K 2.9* 2.8*  --  3.8  CL 91* 98  --  101  CO2 26 27  --  25  GLUCOSE 196* 162*  --  149*  BUN 11 8  --  9  CREATININE 1.07 0.98  --  1.00  CALCIUM 8.2* 7.9*  --  7.7*  MG  --   --  1.8 1.9  PHOS  --   --   --  1.9*   GFR: Estimated Creatinine Clearance: 55.9 mL/min (by C-G formula based on SCr of 1 mg/dL). Liver Function Tests: Recent Labs  Lab 02/11/18 2028 02/12/18 0416 02/13/18 0335  AST 83* 80* 88*  ALT 108* 99* 106*  ALKPHOS 137* 128* 153*  BILITOT 1.7* 1.3* 1.5*  PROT 7.0 6.2* 5.8*  ALBUMIN 3.4* 2.9* 2.6*   Recent Labs  Lab 02/13/18 0918  LIPASE 46   Recent Labs  Lab 02/12/18 0701  AMMONIA 35   Coagulation Profile: No results for input(s): INR, PROTIME in the last 168 hours. Cardiac Enzymes: Recent Labs  Lab 02/12/18 0416 02/12/18 1224 02/12/18 1831 02/13/18 0918  TROPONINI 0.07* 0.07* 0.12* 0.07*   BNP (last 3 results) No results for input(s): PROBNP in the last 8760 hours. HbA1C: No results for input(s): HGBA1C in  the last 72 hours. CBG: Recent Labs  Lab 02/12/18 1218 02/12/18 1643 02/12/18 2217 02/13/18 0706 02/13/18 1149  GLUCAP 217* 157* 146* 132* 170*   Lipid Profile: No results for input(s): CHOL, HDL, LDLCALC, TRIG, CHOLHDL, LDLDIRECT in the last 72 hours. Thyroid Function Tests: No results for input(s): TSH, T4TOTAL, FREET4, T3FREE, THYROIDAB in the last 72 hours. Anemia Panel: No results for input(s):  VITAMINB12, FOLATE, FERRITIN, TIBC, IRON, RETICCTPCT in the last 72 hours. Sepsis Labs: Recent Labs  Lab 02/11/18 2038  LATICACIDVEN 1.44    Recent Results (from the past 240 hour(s))  Blood culture (routine x 2)     Status: None (Preliminary result)   Collection Time: 02/11/18  8:30 PM  Result Value Ref Range Status   Specimen Description BLOOD LEFT ARM  Final   Special Requests   Final    BOTTLES DRAWN AEROBIC AND ANAEROBIC Blood Culture results may not be optimal due to an inadequate volume of blood received in culture bottles   Culture   Final    NO GROWTH < 24 HOURS Performed at [facility], [address], [place], [place] [address]    Report Status PENDING  Incomplete  Blood culture (routine x 2)     Status: None (Preliminary result)   Collection Time: 02/11/18  9:30 PM  Result Value Ref Range Status   Specimen Description BLOOD LEFT HAND  Final   Special Requests   Final    BOTTLES DRAWN AEROBIC AND ANAEROBIC Blood Culture adequate volume   Culture   Final    NO GROWTH < 24 HOURS Performed at [facility], [address], [place], [place] [address]    Report Status PENDING  Incomplete  Culture, sputum-assessment     Status: None   Collection Time: 02/12/18  1:48 PM  Result Value Ref Range Status   Specimen Description SPUTUM  Final   Special Requests NONE  Final   Sputum evaluation   Final    THIS SPECIMEN IS ACCEPTABLE FOR SPUTUM CULTURE Performed at [facility], [address] [place], [place] [address]    Report Status 02/12/2018  FINAL  Final  Culture, respiratory     Status: None (Preliminary result)   Collection Time: 02/12/18  1:48 PM  Result Value Ref Range Status   Specimen Description SPUTUM  Final   Special Requests NONE Reflexed from [id number]  Final   Gram Stain   Final    RARE WBC PRESENT,BOTH PMN AND MONONUCLEAR FEW GRAM POSITIVE COCCI IN PAIRS IN CLUSTERS RARE GRAM POSITIVE RODS    Culture   Final    CULTURE REINCUBATED FOR BETTER GROWTH Performed at [facility], [address], [place], [place] [address]    Report Status PENDING  Incomplete    Radiology Studies: Dg Chest 2 View  Result Date: 02/11/2018 CLINICAL DATA:  Acute onset of confusion and fever. Urinary incontinence. EXAM: CHEST - 2 VIEW COMPARISON:  Chest radiograph performed 01/11/2018 FINDINGS: New right midlung airspace opacification is compatible with pneumonia. No pleural effusion or pneumothorax is seen. The heart is normal in size. No acute osseous abnormalities are identified. There is chronic superior subluxation of both humeral heads, reflecting chronic rotator cuff tears. Calcification is noted along the abdominal aorta. IMPRESSION: New right midlung airspace opacification is compatible with pneumonia. Electronically Signed   By: Roanna Raider M.D.   On: 02/11/2018 21:04   Ct Head Wo Contrast  Result Date: 02/12/2018 CLINICAL DATA:  84 year old male with altered mental status. Initial encounter. EXAM: CT HEAD WITHOUT CONTRAST TECHNIQUE: Contiguous axial images were obtained from the base of the skull through the vertex without intravenous contrast. COMPARISON:  01/11/2018 head CT.  01/24/2013 brain MR. FINDINGS: Brain: No intracranial hemorrhage or CT evidence of large acute infarct. Chronic microvascular changes. Mild to moderate global atrophy. No intracranial mass lesion noted on this  unenhanced exam. Vascular: Vascular calcifications Skull: No acute abnormality. Sinuses/Orbits: Mild exophthalmos. Visualized paranasal sinuses are  clear. Other: Mastoid air cells and middle ear cavities are clear. IMPRESSION: 1. No intracranial hemorrhage or CT evidence of large acute infarct. 2. Chronic microvascular changes. 3. Mild to moderate global atrophy. Electronically Signed   By: Lacy Duverney M.D.   On: 02/12/2018 09:16   US Abdomen Complete  Result Date: 02/12/2018 CLINICAL DATA:  Acute onset of nausea and vomiting. EXAM: ABDOMEN ULTRASOUND COMPLETE COMPARISON:  None. FINDINGS: Gallbladder: Stones are noted within the gallbladder, measuring up to 8 mm in size. The gallbladder wall is borderline prominent. No ultrasonographic Murphy's sign is elicited. No pericholecystic fluid is seen. Common bile duct: Diameter: 0.3 cm, within normal limits in caliber. Liver: No focal lesion identified. Within normal limits in parenchymal echogenicity. Portal vein is patent on color Doppler imaging with normal direction of blood flow towards the liver. IVC: No abnormality visualized. Pancreas: Not well characterized due to overlying bowel gas. Spleen: Size and appearance within normal limits. Right Kidney: Length: 11.0 cm. Echogenicity within normal limits. No mass or hydronephrosis visualized. Left Kidney: Length: 10.2 cm. Echogenicity within normal limits. A 4.0 cm cyst is partially imaged at the left kidney. No hydronephrosis visualized. Abdominal aorta: No aneurysm visualized. Other findings: None. IMPRESSION: 1. No acute abnormality seen to explain the patient's symptoms. 2. Cholelithiasis. Borderline prominence of the gallbladder wall may reflect chronic inflammation. No definite evidence of cholecystitis. 3. 4.0 cm left renal cyst noted. Electronically Signed   By: Roanna Raider M.D.   On: 02/12/2018 04:04   Nm Hepato W/eject Fract  Result Date: 02/12/2018 CLINICAL DATA:  Abdominal pain with nausea and vomiting EXAM: NUCLEAR MEDICINE HEPATOBILIARY IMAGING WITH GALLBLADDER EF VIEWS: Anterior, right lateral right upper quadrant. RADIOPHARMACEUTICALS:   5.3 mCi Tc-62m  Choletec IV COMPARISON:  Ultrasound right upper quadrant February 12, 2018 FINDINGS: Liver uptake of radiotracer is normal. There is prompt visualization of gallbladder and small bowel, indicating patency of the cystic and common bile ducts. The patient consumed 8 ounces of Ensure orally with calculation of the computer generated ejection fraction of radiotracer from the gallbladder. Patient did not experience clinical symptoms with the oral Ensure consumption. The computer generated ejection fraction of radiotracer from the gallbladder is normal at 75%, normal greater than 33% using the oral agent. IMPRESSION: Study within normal limits. Electronically Signed   By: Bretta Bang III M.D.   On: 02/12/2018 13:38   Dg Chest Port 1 View  Result Date: 02/13/2018 CLINICAL DATA:  Follow-up pneumonia EXAM: PORTABLE CHEST 1 VIEW COMPARISON:  PA and lateral chest x-ray of February 11, 2018 FINDINGS: There is confluent airspace opacity in the right upper lobe which is more conspicuous today. The right middle and lower lobes appear clear as does the left lung. The heart and pulmonary vascularity are normal. The trachea is midline. There is no pleural effusion. The bony thorax exhibits no acute abnormality. IMPRESSION: Persistent right upper lobe pneumonia with slightly increased parenchymal consolidation noted. No significant changes observed elsewhere. Electronically Signed   By: David  [place] M.D.   On: 02/13/2018 07:32   Scheduled Meds: . amLODipine  10 mg Oral Daily  . aspirin EC  81 mg Oral Daily  . atorvastatin  40 mg Oral q1800  . azithromycin  500 mg Oral QHS  . clopidogrel  75 mg Oral Daily  . enoxaparin (LOVENOX) injection  40 mg Subcutaneous Daily  . guaiFENesin  1,200 mg  Oral BID  . insulin aspart  0-9 Units Subcutaneous TID WC  . ipratropium-albuterol  3 mL Nebulization TID  . losartan  100 mg Oral QPM  . metoprolol succinate  50 mg Oral QPM  . omega-3 acid ethyl esters  1,000 mg  Oral BID  . phosphorus  500 mg Oral BID  . polyethylene glycol  17 g Oral BID  . senna-docusate  1 tablet Oral BID   Continuous Infusions: . cefTRIAXone (ROCEPHIN)  IV Stopped (02/12/18 2235)    LOS: 2 days   Merlene Laughtermair Latif Cameran Ahmed, DO [facility] Pager [phone]  If 7PM-7AM, please contact night-coverage [email or web address] Password [email or web address] 02/13/2018, 11:53 AM

## 2018-02-13 NOTE — Progress Notes (Signed)
PHARMACIST - PHYSICIAN COMMUNICATION  DR:  Marland McalpineSheikh  CONCERNING: IV to Oral Route Change Policy  RECOMMENDATION: This patient is receiving Azithromycin by the intravenous route.  Based on criteria approved by the Pharmacy and Therapeutics Committee, the intravenous medication(s) is/are being converted to the equivalent oral dose form(s).   DESCRIPTION: These criteria include:  The patient is eating (either orally or via tube) and/or has been taking other orally administered medications for a least 24 hours  The patient has no evidence of active gastrointestinal bleeding or impaired GI absorption (gastrectomy, short bowel, patient on TNA or NPO).  If you have questions about this conversion, please contact the Pharmacy Department  []   [phone]( [phone] )  [facility] []   [phone]( [phone] )  [facility] [x]   [phone]( [phone] )  [facility] []   [phone]( [phone]  [facility] []   [phone]( [phone] )  [facility]   Enslie Sahota, PharmD, [place], [place], CPP Infectious Disease Pharmacist Pager: [phone] 02/13/2018 9:38 AM

## 2018-02-13 NOTE — Consult Note (Signed)
Reason for Consult:minimally elevated troponin I Referring Physician:[facility] hospital[facility]  Alexander Donaldson is an 84 y.o. male.  Alexander Donaldson is a 84 year old male with past medical history significant for multivessel coronary artery disease status post PCI to LAD, left circumflex and RCA, and remote past and PTCA stenting to the RCA in July 2018, hypertension, hyperlipidemia, type 2 diabetes mellitus, remote tobacco abuse, history of left foot drop,because of productive cough associated with high-grade fever and confusion and was noted to have right middle lobe pneumonia.  Cardiology consultation is called.  The aspiration was noted to have minimally elevated troponin.  Patient denies any chest pain, nausea, vomiting, diaphoresis.  Denies any palpitation, lightheadedness or syncope.  Does give history of exertional dyspnea has not had any cardiac workup since his PCI to RCA in July 2018.  Patient's EKG showed no acute ischemic changes  Troponin I his minimally elevated.  Patient states his coughing and fever has improved.  Past Medical History:  Diagnosis Date  . Coronary artery disease   . Diabetes mellitus   . Diverticulitis   . Hypertension     Past Surgical History:  Procedure Laterality Date  . ANGIOPLASTY    . CORONARY STENT INTERVENTION N/A 02/02/2017   Procedure: Coronary Stent Intervention;  Surgeon: Charolette Forward, MD;  Location: [facility];  Service: Cardiovascular;  Laterality: N/A;  . CORONARY STENTS    . LEFT HEART CATH AND CORONARY ANGIOGRAPHY N/A 02/02/2017   Procedure: Left Heart Cath and Coronary Angiography;  Surgeon: Charolette Forward, MD;  Location: [facility];  Service: Cardiovascular;  Laterality: N/A;  . LEFT HEART CATHETERIZATION WITH CORONARY ANGIOGRAM N/A 03/16/2012   Procedure: LEFT HEART CATHETERIZATION WITH CORONARY ANGIOGRAM;  Surgeon: Clent Demark, MD;  Location: [facility];  Service: Cardiovascular;  Laterality: N/A;  . PERCUTANEOUS CORONARY STENT  INTERVENTION (PCI-S)  03/16/2012   Procedure: PERCUTANEOUS CORONARY STENT INTERVENTION (PCI-S);  Surgeon: Clent Demark, MD;  Location: [facility];  Service: Cardiovascular;;  . TONSILLECTOMY      History reviewed. No pertinent family history.  Social History:  reports that he has never smoked. He has never used smokeless tobacco. He reports that he does not drink alcohol or use drugs.  Allergies: No Known Allergies  Medications: I have reviewed the patient's current medications.  Results for orders placed or performed during the hospital encounter of 02/11/18 (from the past 48 hour(s))  Urinalysis, Routine w reflex microscopic     Status: Abnormal   Collection Time: 02/11/18  6:20 PM  Result Value Ref Range   Color, Urine YELLOW YELLOW   APPearance CLEAR CLEAR   Specific Gravity, Urine 1.015 1.005 - 1.030   pH 6.0 5.0 - 8.0   Glucose, UA 150 (A) NEGATIVE mg/dL   Hgb urine dipstick MODERATE (A) NEGATIVE   Bilirubin Urine NEGATIVE NEGATIVE   Ketones, ur 5 (A) NEGATIVE mg/dL   Protein, ur 100 (A) NEGATIVE mg/dL   Nitrite NEGATIVE NEGATIVE   Leukocytes, UA NEGATIVE NEGATIVE   RBC / HPF 6-10 0 - 5 RBC/hpf   WBC, UA 0-5 0 - 5 WBC/hpf   Bacteria, UA NONE SEEN NONE SEEN    Comment: Performed at [facility], [address] [place], [place] [address]  CBG monitoring, ED     Status: Abnormal   Collection Time: 02/11/18  8:27 PM  Result Value Ref Range   Glucose-Capillary 188 (H) 70 - 99 mg/dL   Comment 1 Notify RN  Comment 2 Document in Chart   Comprehensive metabolic panel     Status: Abnormal   Collection Time: 02/11/18  8:28 PM  Result Value Ref Range   Sodium 128 (L) 135 - 145 mmol/L   Potassium 2.9 (L) 3.5 - 5.1 mmol/L   Chloride 91 (L) 98 - 111 mmol/L   CO2 26 22 - 32 mmol/L   Glucose, Bld 196 (H) 70 - 99 mg/dL   BUN 11 8 - 23 mg/dL   Creatinine, Ser 1.07 0.61 - 1.24 mg/dL   Calcium 8.2 (L) 8.9 - 10.3 mg/dL   Total Protein 7.0 6.5 - 8.1 g/dL   Albumin 3.4 (L)  3.5 - 5.0 g/dL   AST 83 (H) 15 - 41 U/L   ALT 108 (H) 0 - 44 U/L   Alkaline Phosphatase 137 (H) 38 - 126 U/L   Total Bilirubin 1.7 (H) 0.3 - 1.2 mg/dL   GFR calc non Af Amer >60 >60 mL/min   GFR calc Af Amer >60 >60 mL/min    Comment: (NOTE) The eGFR has been calculated using the CKD EPI equation. This calculation has not been validated in all clinical situations. eGFR's persistently <60 mL/min signify possible Chronic Kidney Disease.    Anion gap 11 5 - 15    Comment: Performed at [facility] [address] [place], [place] [address]  CBC with Differential     Status: Abnormal   Collection Time: 02/11/18  8:28 PM  Result Value Ref Range   WBC 9.0 4.0 - 10.5 K/uL   RBC 4.22 4.22 - 5.81 MIL/uL   Hemoglobin 12.8 (L) 13.0 - 17.0 g/dL   HCT 38.0 (L) 39.0 - 52.0 %   MCV 90.0 78.0 - 100.0 fL   MCH 30.3 26.0 - 34.0 pg   MCHC 33.7 30.0 - 36.0 g/dL   RDW 12.5 11.5 - 15.5 %   Platelets 199 150 - 400 K/uL   Neutrophils Relative % 70 %   Neutro Abs 6.3 1.7 - 7.7 K/uL   Lymphocytes Relative 16 %   Lymphs Abs 1.4 0.7 - 4.0 K/uL   Monocytes Relative 13 %   Monocytes Absolute 1.2 (H) 0.1 - 1.0 K/uL   Eosinophils Relative 1 %   Eosinophils Absolute 0.1 0.0 - 0.7 K/uL   Basophils Relative 0 %   Basophils Absolute 0.0 0.0 - 0.1 K/uL   Immature Granulocytes 0 %   Abs Immature Granulocytes 0.0 0.0 - 0.1 K/uL    Comment: Performed at [facility], [place] [address] [place], [place] [address]  Blood culture (routine x 2)     Status: None (Preliminary result)   Collection Time: 02/11/18  8:30 PM  Result Value Ref Range   Specimen Description BLOOD LEFT ARM    Special Requests      BOTTLES DRAWN AEROBIC AND ANAEROBIC Blood Culture results may not be optimal due to an inadequate volume of blood received in culture bottles   Culture      NO GROWTH < 24 HOURS Performed at [facility] [address] [place], [place] [address]    Report Status PENDING   I-Stat CG4 Lactic  Acid, ED     Status: None   Collection Time: 02/11/18  8:38 PM  Result Value Ref Range   Lactic Acid, Venous 1.44 0.5 - 1.9 mmol/L  Blood culture (routine x 2)     Status: None (Preliminary result)   Collection Time: 02/11/18  9:30  PM  Result Value Ref Range   Specimen Description BLOOD LEFT HAND    Special Requests      BOTTLES DRAWN AEROBIC AND ANAEROBIC Blood Culture adequate volume   Culture      NO GROWTH < 24 HOURS Performed at [facility] [address] [place], [place] [address]    Report Status PENDING   Basic metabolic panel     Status: Abnormal   Collection Time: 02/12/18  4:16 AM  Result Value Ref Range   Sodium 136 135 - 145 mmol/L   Potassium 2.8 (L) 3.5 - 5.1 mmol/L   Chloride 98 98 - 111 mmol/L   CO2 27 22 - 32 mmol/L   Glucose, Bld 162 (H) 70 - 99 mg/dL   BUN 8 8 - 23 mg/dL   Creatinine, Ser 0.98 0.61 - 1.24 mg/dL   Calcium 7.9 (L) 8.9 - 10.3 mg/dL   GFR calc non Af Amer >60 >60 mL/min   GFR calc Af Amer >60 >60 mL/min    Comment: (NOTE) The eGFR has been calculated using the CKD EPI equation. This calculation has not been validated in all clinical situations. eGFR's persistently <60 mL/min signify possible Chronic Kidney Disease.    Anion gap 11 5 - 15    Comment: Performed at [facility] [address] [place], [place] [address]  Hepatic function panel     Status: Abnormal   Collection Time: 02/12/18  4:16 AM  Result Value Ref Range   Total Protein 6.2 (L) 6.5 - 8.1 g/dL   Albumin 2.9 (L) 3.5 - 5.0 g/dL   AST 80 (H) 15 - 41 U/L   ALT 99 (H) 0 - 44 U/L   Alkaline Phosphatase 128 (H) 38 - 126 U/L   Total Bilirubin 1.3 (H) 0.3 - 1.2 mg/dL   Bilirubin, Direct 0.3 (H) 0.0 - 0.2 mg/dL   Indirect Bilirubin 1.0 (H) 0.3 - 0.9 mg/dL    Comment: Performed at [facility] [address], [place],  [address]  CBC WITH DIFFERENTIAL     Status: Abnormal   Collection Time: 02/12/18  4:16 AM  Result Value Ref Range   WBC 7.4 4.0 -  10.5 K/uL   RBC 3.90 (L) 4.22 - 5.81 MIL/uL   Hemoglobin 11.9 (L) 13.0 - 17.0 g/dL   HCT 34.8 (L) 39.0 - 52.0 %   MCV 89.2 78.0 - 100.0 fL   MCH 30.5 26.0 - 34.0 pg   MCHC 34.2 30.0 - 36.0 g/dL   RDW 12.7 11.5 - 15.5 %   Platelets 163 150 - 400 K/uL   Neutrophils Relative % 66 %   Neutro Abs 4.9 1.7 - 7.7 K/uL   Lymphocytes Relative 18 %   Lymphs Abs 1.3 0.7 - 4.0 K/uL   Monocytes Relative 14 %   Monocytes Absolute 1.0 0.1 - 1.0 K/uL   Eosinophils Relative 1 %   Eosinophils Absolute 0.1 0.0 - 0.7 K/uL   Basophils Relative 0 %   Basophils Absolute 0.0 0.0 - 0.1 K/uL   Immature Granulocytes 1 %   Abs Immature Granulocytes 0.1 0.0 - 0.1 K/uL    Comment: Performed at [facility] [address], [place], [place] [address]  Troponin I (q 6hr x 3)     Status: Abnormal   Collection Time: 02/12/18  4:16 AM  Result Value Ref Range   Troponin I 0.07 (HH) <0.03 ng/mL    Comment: CRITICAL  RESULT CALLED TO, READ BACK BY AND VERIFIED WITH: MERRITT C,RN 02/12/18 0532 WAYK Performed at [facility], [address], [place], [place] [address]   Ammonia     Status: None   Collection Time: 02/12/18  7:01 AM  Result Value Ref Range   Ammonia 35 9 - 35 umol/L    Comment: Performed at [facility], [place] [address] [place], [place] [address]  Glucose, capillary     Status: Abnormal   Collection Time: 02/12/18  7:23 AM  Result Value Ref Range   Glucose-Capillary 158 (H) 70 - 99 mg/dL   Comment 1 Notify RN   Glucose, capillary     Status: Abnormal   Collection Time: 02/12/18 12:18 PM  Result Value Ref Range   Glucose-Capillary 217 (H) 70 - 99 mg/dL  Troponin I (q 6hr x 3)     Status: Abnormal   Collection Time: 02/12/18 12:24 PM  Result Value Ref Range   Troponin I 0.07 (HH) <0.03 ng/mL    Comment: CRITICAL VALUE NOTED.  VALUE IS CONSISTENT WITH PREVIOUSLY REPORTED AND CALLED VALUE. Performed at [facility], [place] [address], [place], [place] [address]   Magnesium      Status: None   Collection Time: 02/12/18 12:24 PM  Result Value Ref Range   Magnesium 1.8 1.7 - 2.4 mg/dL    Comment: Performed at [facility], [place] [address], [place], [place] [address]  Hepatitis panel, acute     Status: None   Collection Time: 02/12/18 12:24 PM  Result Value Ref Range   Hepatitis B Surface Ag Negative Negative   HCV Ab 0.3 0.0 - 0.9 s/co ratio    Comment: (NOTE)                                  Negative:     < 0.8                             Indeterminate: 0.8 - 0.9                                  Positive:     > 0.9 The CDC recommends that a positive HCV antibody result be followed up with a HCV Nucleic Acid Amplification test (338329). Performed At: [facility] [place], [place] [id number] Rush Farmer MD [id number]    Hep A IgM Negative Negative   Hep B C IgM Negative Negative  Culture, sputum-assessment     Status: None   Collection Time: 02/12/18  1:48 PM  Result Value Ref Range   Specimen Description SPUTUM    Special Requests NONE    Sputum evaluation      THIS SPECIMEN IS ACCEPTABLE FOR SPUTUM CULTURE Performed at [facility], [place] [address], [place], [place] [address]    Report Status 02/12/2018 FINAL   Strep pneumoniae urinary antigen     Status: None   Collection Time: 02/12/18  1:48 PM  Result Value Ref Range   Strep Pneumo Urinary Antigen NEGATIVE NEGATIVE    Comment:        Infection due to S. pneumoniae cannot be absolutely ruled out since the antigen present may be below the detection limit of the test. Performed at [facility], [place] [address] [place], [place] [address]   Culture, respiratory     Status: None (Preliminary result)   Collection Time: 02/12/18  1:48 PM  Result Value Ref Range   Specimen Description SPUTUM    Special Requests NONE Reflexed from [id number]    Gram Stain      RARE WBC PRESENT,BOTH PMN AND MONONUCLEAR FEW GRAM POSITIVE COCCI IN PAIRS IN CLUSTERS RARE GRAM  POSITIVE RODS Performed at [facility], [place] [address], [place], [place] [address]    Culture PENDING    Report Status PENDING   Glucose, capillary     Status: Abnormal   Collection Time: 02/12/18  4:43 PM  Result Value Ref Range   Glucose-Capillary 157 (H) 70 - 99 mg/dL  Troponin I (q 6hr x 3)     Status: Abnormal   Collection Time: 02/12/18  6:31 PM  Result Value Ref Range   Troponin I 0.12 (HH) <0.03 ng/mL    Comment: CRITICAL VALUE NOTED.  VALUE IS CONSISTENT WITH PREVIOUSLY REPORTED AND CALLED VALUE. Performed at [facility], [place] [address] [place], [place] [address]   Glucose, capillary     Status: Abnormal   Collection Time: 02/12/18 10:17 PM  Result Value Ref Range   Glucose-Capillary 146 (H) 70 - 99 mg/dL  CBC with Differential/Platelet     Status: Abnormal   Collection Time: 02/13/18  3:35 AM  Result Value Ref Range   WBC 8.3 4.0 - 10.5 K/uL   RBC 3.69 (L) 4.22 - 5.81 MIL/uL   Hemoglobin 11.3 (L) 13.0 - 17.0 g/dL   HCT 32.9 (L) 39.0 - 52.0 %   MCV 89.2 78.0 - 100.0 fL   MCH 30.6 26.0 - 34.0 pg   MCHC 34.3 30.0 - 36.0 g/dL   RDW 12.7 11.5 - 15.5 %   Platelets 188 150 - 400 K/uL   Neutrophils Relative % 68 %   Neutro Abs 5.7 1.7 - 7.7 K/uL   Lymphocytes Relative 16 %   Lymphs Abs 1.3 0.7 - 4.0 K/uL   Monocytes Relative 14 %   Monocytes Absolute 1.2 (H) 0.1 - 1.0 K/uL   Eosinophils Relative 1 %   Eosinophils Absolute 0.1 0.0 - 0.7 K/uL   Basophils Relative 0 %   Basophils Absolute 0.0 0.0 - 0.1 K/uL   Immature Granulocytes 1 %   Abs Immature Granulocytes 0.0 0.0 - 0.1 K/uL    Comment: Performed at [facility], [place] [address], [place], [place] [address]  Comprehensive metabolic panel     Status: Abnormal   Collection Time: 02/13/18  3:35 AM  Result Value Ref Range   Sodium 137 135 - 145 mmol/L   Potassium 3.8 3.5 - 5.1 mmol/L    Comment: DELTA CHECK NOTED   Chloride 101 98 - 111 mmol/L   CO2 25 22 - 32 mmol/L   Glucose, Bld 149 (H) 70 -  99 mg/dL   BUN 9 8 - 23 mg/dL   Creatinine, Ser 1.00 0.61 - 1.24 mg/dL   Calcium 7.7 (L) 8.9 - 10.3 mg/dL   Total Protein 5.8 (L) 6.5 - 8.1 g/dL   Albumin 2.6 (L) 3.5 - 5.0 g/dL   AST 88 (H) 15 - 41 U/L   ALT 106 (H) 0 - 44 U/L   Alkaline Phosphatase 153 (H) 38 - 126 U/L   Total Bilirubin 1.5 (H) 0.3 - 1.2 mg/dL   GFR calc non Af Amer >  60 >60 mL/min   GFR calc Af Amer >60 >60 mL/min    Comment: (NOTE) The eGFR has been calculated using the CKD EPI equation. This calculation has not been validated in all clinical situations. eGFR's persistently <60 mL/min signify possible Chronic Kidney Disease.    Anion gap 11 5 - 15    Comment: Performed at [facility] [address], [place], [place] [address]  Magnesium     Status: None   Collection Time: 02/13/18  3:35 AM  Result Value Ref Range   Magnesium 1.9 1.7 - 2.4 mg/dL    Comment: Performed at [facility] [address], [place], [place] [address]  Phosphorus     Status: Abnormal   Collection Time: 02/13/18  3:35 AM  Result Value Ref Range   Phosphorus 1.9 (L) 2.5 - 4.6 mg/dL    Comment: Performed at [facility] [address] [place], [place] [address]  Glucose, capillary     Status: Abnormal   Collection Time: 02/13/18  7:06 AM  Result Value Ref Range   Glucose-Capillary 132 (H) 70 - 99 mg/dL  Troponin I (q 6hr x 3)     Status: Abnormal   Collection Time: 02/13/18  9:18 AM  Result Value Ref Range   Troponin I 0.07 (HH) <0.03 ng/mL    Comment: CRITICAL VALUE NOTED.  VALUE IS CONSISTENT WITH PREVIOUSLY REPORTED AND CALLED VALUE. Performed at [facility], [place] [address] [place], [place] [address]   Lipase, blood     Status: None   Collection Time: 02/13/18  9:18 AM  Result Value Ref Range   Lipase 46 11 - 51 U/L    Comment: Performed at [facility] [address], [place], [place] [address]    Dg Chest 2 View  Result Date: 02/11/2018 CLINICAL DATA:  Acute onset of confusion and  fever. Urinary incontinence. EXAM: CHEST - 2 VIEW COMPARISON:  Chest radiograph performed 01/11/2018 FINDINGS: New right midlung airspace opacification is compatible with pneumonia. No pleural effusion or pneumothorax is seen. The heart is normal in size. No acute osseous abnormalities are identified. There is chronic superior subluxation of both humeral heads, reflecting chronic rotator cuff tears. Calcification is noted along the abdominal aorta. IMPRESSION: New right midlung airspace opacification is compatible with pneumonia. Electronically Signed   By: Garald Balding M.D.   On: 02/11/2018 21:04   Ct Head Wo Contrast  Result Date: 02/12/2018 CLINICAL DATA:  84 year old male with altered mental status. Initial encounter. EXAM: CT HEAD WITHOUT CONTRAST TECHNIQUE: Contiguous axial images were obtained from the base of the skull through the vertex without intravenous contrast. COMPARISON:  01/11/2018 head CT.  01/24/2013 brain MR. FINDINGS: Brain: No intracranial hemorrhage or CT evidence of large acute infarct. Chronic microvascular changes. Mild to moderate global atrophy. No intracranial mass lesion noted on this unenhanced exam. Vascular: Vascular calcifications Skull: No acute abnormality. Sinuses/Orbits: Mild exophthalmos. Visualized paranasal sinuses are clear. Other: Mastoid air cells and middle ear cavities are clear. IMPRESSION: 1. No intracranial hemorrhage or CT evidence of large acute infarct. 2. Chronic microvascular changes. 3. Mild to moderate global atrophy. Electronically Signed   By: Genia Del M.D.   On: 02/12/2018 09:16   US Abdomen Complete  Result Date: 02/12/2018 CLINICAL DATA:  Acute onset of nausea and vomiting. EXAM: ABDOMEN ULTRASOUND COMPLETE COMPARISON:  None. FINDINGS: Gallbladder: Stones are noted within the gallbladder, measuring up to 8 mm in size. The  gallbladder wall is borderline prominent. No ultrasonographic Murphy's sign is elicited. No pericholecystic fluid is seen.  Common bile duct: Diameter: 0.3 cm, within normal limits in caliber. Liver: No focal lesion identified. Within normal limits in parenchymal echogenicity. Portal vein is patent on color Doppler imaging with normal direction of blood flow towards the liver. IVC: No abnormality visualized. Pancreas: Not well characterized due to overlying bowel gas. Spleen: Size and appearance within normal limits. Right Kidney: Length: 11.0 cm. Echogenicity within normal limits. No mass or hydronephrosis visualized. Left Kidney: Length: 10.2 cm. Echogenicity within normal limits. A 4.0 cm cyst is partially imaged at the left kidney. No hydronephrosis visualized. Abdominal aorta: No aneurysm visualized. Other findings: None. IMPRESSION: 1. No acute abnormality seen to explain the patient's symptoms. 2. Cholelithiasis. Borderline prominence of the gallbladder wall may reflect chronic inflammation. No definite evidence of cholecystitis. 3. 4.0 cm left renal cyst noted. Electronically Signed   By: Garald Balding M.D.   On: 02/12/2018 04:04   Nm Hepato W/eject Fract  Result Date: 02/12/2018 CLINICAL DATA:  Abdominal pain with nausea and vomiting EXAM: NUCLEAR MEDICINE HEPATOBILIARY IMAGING WITH GALLBLADDER EF VIEWS: Anterior, right lateral right upper quadrant. RADIOPHARMACEUTICALS:  5.3 mCi Tc-8m Choletec IV COMPARISON:  Ultrasound right upper quadrant February 12, 2018 FINDINGS: Liver uptake of radiotracer is normal. There is prompt visualization of gallbladder and small bowel, indicating patency of the cystic and common bile ducts. The patient consumed 8 ounces of Ensure orally with calculation of the computer generated ejection fraction of radiotracer from the gallbladder. Patient did not experience clinical symptoms with the oral Ensure consumption. The computer generated ejection fraction of radiotracer from the gallbladder is normal at 75%, normal greater than 33% using the oral agent. IMPRESSION: Study within normal limits.  Electronically Signed   By: WLowella GripIII M.D.   On: 02/12/2018 13:38   Dg Chest Port 1 View  Result Date: 02/13/2018 CLINICAL DATA:  Follow-up pneumonia EXAM: PORTABLE CHEST 1 VIEW COMPARISON:  PA and lateral chest x-ray of February 11, 2018 FINDINGS: There is confluent airspace opacity in the right upper lobe which is more conspicuous today. The right middle and lower lobes appear clear as does the left lung. The heart and pulmonary vascularity are normal. The trachea is midline. There is no pleural effusion. The bony thorax exhibits no acute abnormality. IMPRESSION: Persistent right upper lobe pneumonia with slightly increased parenchymal consolidation noted. No significant changes observed elsewhere. Electronically Signed   By: David  JMartiniqueM.D.   On: 02/13/2018 07:32    Review of Systems  Constitutional: Positive for fever and malaise/fatigue.  HENT: Negative for hearing loss.   Eyes: Negative for blurred vision.  Respiratory: Positive for cough and shortness of breath.   Cardiovascular: Negative for chest pain.  Gastrointestinal: Negative for nausea and vomiting.  Genitourinary: Negative for dysuria.  Neurological: Negative for dizziness.   Blood pressure (!) 141/76, pulse 89, temperature 98.6 F (37 C), temperature source Oral, resp. rate 20, height '5\' 3"'$  (1.6 m), weight 88.2 kg (194 lb 7.1 oz), SpO2 93 %. Physical Exam  Constitutional: He is oriented to person, place, and time.  HENT:  Head: Normocephalic and atraumatic.  Eyes: Pupils are equal, round, and reactive to light. Conjunctivae are normal. Left eye exhibits no discharge. No scleral icterus.  Neck: Normal range of motion. Neck supple. No JVD present. No tracheal deviation present. No thyromegaly present.  Cardiovascular: Normal rate and regular rhythm.  Murmur (2/6 systolic murmur noted)  heard. Respiratory:  Decreased breath sounds at bases with right lung field rhonchi noted  GI: Soft. Bowel sounds are normal. He  exhibits no distension. There is no tenderness.  Musculoskeletal: He exhibits no tenderness or deformity.  Neurological: He is alert and oriented to person, place, and time.    Assessment/Plan: Minimally elevated troponin.  Probably secondary to demand ischemia.  Doubt significant MI. Right lung pneumonia. Multivessel CAD, status post PCI to LAD, left circumflex and RCA in the past. Hypertension. Diabetes mellitus. Hyperlipidemia. Remote tobacco abuse. Elevated LFTs secondary to statins/amiodarone Left foot drop. Status post hyponatremia Plan DC amiodarone for now. Reduce atorvastatin 40 mg daily. Check lipid panel in a.m. Will schedule for nuclear stress test once stable from pulmonary point of view either inpatient or as outpatient. Consider CT of the chest if no improvement in his infiltrate Charolette Forward 02/13/2018, 11:05 AM

## 2018-02-13 NOTE — Plan of Care (Signed)
Discussed with patient and wife plan of care for the evening, pain medication and something for cough as needed with some teach back displayed

## 2018-02-14 ENCOUNTER — Inpatient Hospital Stay (HOSPITAL_COMMUNITY): Payer: Medicare Other

## 2018-02-14 DIAGNOSIS — J9601 Acute respiratory failure with hypoxia: Secondary | ICD-10-CM

## 2018-02-14 DIAGNOSIS — J181 Lobar pneumonia, unspecified organism: Principal | ICD-10-CM

## 2018-02-14 DIAGNOSIS — E876 Hypokalemia: Secondary | ICD-10-CM

## 2018-02-14 DIAGNOSIS — R627 Adult failure to thrive: Secondary | ICD-10-CM

## 2018-02-14 DIAGNOSIS — G9341 Metabolic encephalopathy: Secondary | ICD-10-CM

## 2018-02-14 DIAGNOSIS — I248 Other forms of acute ischemic heart disease: Secondary | ICD-10-CM

## 2018-02-14 DIAGNOSIS — R945 Abnormal results of liver function studies: Secondary | ICD-10-CM

## 2018-02-14 DIAGNOSIS — E119 Type 2 diabetes mellitus without complications: Secondary | ICD-10-CM

## 2018-02-14 LAB — CULTURE, RESPIRATORY

## 2018-02-14 LAB — CBC
HEMATOCRIT: 36.4 % — AB (ref 39.0–52.0)
HEMOGLOBIN: 12.3 g/dL — AB (ref 13.0–17.0)
MCH: 30.2 pg (ref 26.0–34.0)
MCHC: 33.8 g/dL (ref 30.0–36.0)
MCV: 89.4 fL (ref 78.0–100.0)
Platelets: 261 10*3/uL (ref 150–400)
RBC: 4.07 MIL/uL — ABNORMAL LOW (ref 4.22–5.81)
RDW: 13.2 % (ref 11.5–15.5)
WBC: 9.5 10*3/uL (ref 4.0–10.5)

## 2018-02-14 LAB — GLUCOSE, CAPILLARY
Glucose-Capillary: 133 mg/dL — ABNORMAL HIGH (ref 70–99)
Glucose-Capillary: 205 mg/dL — ABNORMAL HIGH (ref 70–99)
Glucose-Capillary: 190 mg/dL — ABNORMAL HIGH (ref 70–99)
Glucose-Capillary: 159 mg/dL — ABNORMAL HIGH (ref 70–99)

## 2018-02-14 LAB — CULTURE, RESPIRATORY W GRAM STAIN: Culture: NORMAL

## 2018-02-14 NOTE — Progress Notes (Signed)
PROGRESS NOTE  HAVOC SANLUIS ZOX:[id number] DOB: June 03, 1934 DOA: 02/11/2018 PCP: Dellia Nims, MD  HPI/Recap of past 24 hours:  2months of cough Was on augmentin He reports cough still weak, not able to cough up, he denies chest pain, he remain oxygen dependent  Assessment/Plan: Principal Problem:   CAP (community acquired pneumonia) Active Problems:   Acute encephalopathy   CAD (coronary artery disease)   Left foot drop   Type 2 diabetes mellitus with vascular disease (HCC)   Essential hypertension   Elevated LFTs   Nausea & vomiting   Hypokalemia   PNA/acute hypoxic respiratory failure: -+legionella -continue zithromax -he walked with PT today, o2 dropped to 86% on 3liter o2, he was previously not o2 dependent, he may need to go hone on o2 at discharge  Right upper lobe persistent consolidation on repeat cxr, patient reports has been coughing for two months Will get ct chest for further eval  Acute metabolic encephalopathy: Likely from infection Ct head no acute findings.ammonia unrekaralbe Calm and pleasant today  Hypokalemia/hypophosphatemia/hypomagnesemia -improving -repeat in am   lft elevation Hepatitis panel negative Liver US no acute findings (. No acute abnormality seen to explain the patient's symptoms. 2. Cholelithiasis. Borderline prominence of the gallbladder wall may reflect chronic inflammation. No definite evidence of cholecystitis. 3. 4.0 cm left renal cyst noted. From infection in addition to being on statin and amiodarone. 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 daily, Atorvastatin 40 mg p.o. daily, Clopidogrel 75 mg p.o. daily, Losartan 100 mg p.o. nightly, Metoprolol succinate 50 mg p.o. nightly, Nitroglycerin 0.4 mg sublingual every 5 minutes as needed chest pain -cardiology Dr Sharyn Lull input appreciated   noninsulin dependent dm2,  a1c pending On ssi here Home oral meds held  H/o spinal stenosis, siatica, intermittent back pain, recently started to have left foot drop for which he has a left brace on He declined referral to neurospine, he declined possible steroids injection, he prefer conservative management This is confirmed with wife at bedside  FTT: PT recommended home health  I have reviewed tele, has been unremarkable Will d/c tele  Code Status: full  Family Communication: patient and wife at bedside  Disposition Plan: home with home health in 1-2 days   Consultants:  cardiology  Procedures:  none  Antibiotics:  As above   Objective: BP (!) 142/74   Pulse 90   Temp 98.2 F (36.8 C) (Oral)   Resp 17   Ht 5\' 3"  (1.6 m)   Wt 82.5 kg (181 lb 14.1 oz)   SpO2 97%   BMI 32.22 kg/m   Intake/Output Summary (Last 24 hours) at 02/14/2018 0811 Last data filed at 02/14/2018 0700 Gross per 24 hour  Intake 1490 ml  Output 1000 ml  Net 490 ml   Filed Weights   02/12/18 0300 02/13/18 0246 02/14/18 0500  Weight: 80.9 kg (178 lb 5.6 oz) 88.2 kg (194 lb 7.1 oz) 82.5  kg (181 lb 14.1 oz)    Exam: Patient is examined daily including today on 02/14/2018, exams remain the same as of yesterday except that has changed    General:  Frail elderly, NAD , hard of hearing, but very pleasant  Cardiovascular: RRR  Respiratory: diminished at basis, no wheezing, no rales, no rhonchi  Abdomen: Soft/ND/NT, positive BS  Musculoskeletal: No Edema  Neuro:  alert, oriented   Data Reviewed: Basic Metabolic Panel: Recent Labs  Lab 02/11/18 2028 02/12/18 0416 02/12/18 1224 02/13/18 0335  NA 128* 136  --  137  K 2.9* 2.8*  --  3.8  CL 91* 98  --  101  CO2 26 27  --  25  GLUCOSE 196* 162*  --  149*  BUN 11 8  --  9  CREATININE 1.07 0.98  --  1.00  CALCIUM 8.2* 7.9*  --  7.7*  MG  --   --  1.8 1.9  PHOS  --   --   --  1.9*   Liver Function Tests: Recent Labs  Lab 02/11/18 2028 02/12/18 0416 02/13/18 0335  AST 83* 80* 88*  ALT 108* 99* 106*  ALKPHOS 137* 128* 153*  BILITOT 1.7* 1.3* 1.5*  PROT 7.0 6.2* 5.8*  ALBUMIN 3.4* 2.9* 2.6*   Recent Labs  Lab 02/13/18 0918  LIPASE 46   Recent Labs  Lab 02/12/18 0701  AMMONIA 35   CBC: Recent Labs  Lab 02/11/18 2028 02/12/18 0416 02/13/18 0335  WBC 9.0 7.4 8.3  NEUTROABS 6.3 4.9 5.7  HGB 12.8* 11.9* 11.3*  HCT 38.0* 34.8* 32.9*  MCV 90.0 89.2 89.2  PLT 199 163 188   Cardiac Enzymes:   Recent Labs  Lab 02/12/18 1224 02/12/18 1831 02/13/18 0918 02/13/18 1535 02/13/18 1942  TROPONINI 0.07* 0.12* 0.07* 0.05* 0.05*   BNP (last 3 results) Recent Labs    01/20/18 1639  BNP 63.6    ProBNP (last 3 results) No results for input(s): PROBNP in the last 8760 hours.  CBG: Recent Labs  Lab 02/13/18 0706 02/13/18 1149 02/13/18 1700 02/13/18 2127 02/14/18 0726  GLUCAP 132* 170* 156* 155* 133*    Recent Results (from the past 240 hour(s))  Blood culture (routine x 2)     Status: None (Preliminary result)   Collection Time: 02/11/18  8:30 PM  Result Value Ref Range Status   Specimen Description BLOOD LEFT ARM  Final   Special Requests   Final    BOTTLES DRAWN AEROBIC AND ANAEROBIC Blood Culture results may not be optimal due to an inadequate volume of blood received in culture bottles   Culture   Final    NO GROWTH 2 DAYS Performed at [facility], [address], [place], [place] [address]    Report Status PENDING  Incomplete  Blood culture (routine  x 2)     Status: None (Preliminary result)   Collection Time: 02/11/18  9:30 PM  Result Value Ref Range Status   Specimen Description BLOOD LEFT HAND  Final   Special Requests   Final    BOTTLES DRAWN AEROBIC AND ANAEROBIC Blood Culture adequate volume   Culture   Final    NO GROWTH 2 DAYS Performed at [facility], [address] [place], [place] [address]    Report Status PENDING  Incomplete  Culture, sputum-assessment     Status: None   Collection Time: 02/12/18  1:48 PM  Result Value Ref Range Status   Specimen  Description SPUTUM  Final   Special Requests NONE  Final   Sputum evaluation   Final    THIS SPECIMEN IS ACCEPTABLE FOR SPUTUM CULTURE Performed at [facility], [address] [place], [place] [address]    Report Status 02/12/2018 FINAL  Final  Culture, respiratory     Status: None (Preliminary result)   Collection Time: 02/12/18  1:48 PM  Result Value Ref Range Status   Specimen Description SPUTUM  Final   Special Requests NONE Reflexed from [id number]  Final   Gram Stain   Final    RARE WBC PRESENT,BOTH PMN AND MONONUCLEAR FEW GRAM POSITIVE COCCI IN PAIRS IN CLUSTERS RARE GRAM POSITIVE RODS    Culture   Final    CULTURE REINCUBATED FOR BETTER GROWTH Performed at [facility], [address] [place], [place] [address]    Report Status PENDING  Incomplete     Studies: No results found.  Scheduled Meds: . amLODipine  10 mg Oral Daily  . aspirin EC  81 mg Oral Daily  . atorvastatin  40 mg Oral q1800  . clopidogrel  75 mg Oral Daily  . enoxaparin (LOVENOX) injection  40 mg Subcutaneous Daily  . guaiFENesin  1,200 mg Oral BID  . insulin aspart  0-9 Units Subcutaneous TID WC  . ipratropium-albuterol  3 mL Nebulization TID  . losartan  100 mg Oral QPM  . metoprolol succinate  50 mg Oral QPM  . omega-3 acid ethyl esters  1,000 mg Oral BID  . polyethylene glycol  17 g Oral BID  . senna-docusate  1 tablet Oral BID    Continuous Infusions: .  azithromycin Stopped (02/13/18 2300)  . cefTRIAXone (ROCEPHIN)  IV Stopped (02/13/18 2110)     Time spent: 35mins I have personally reviewed and interpreted on  02/14/2018 daily labs, tele strips, imagings as discussed above under date review session and assessment and plans.  I reviewed all nursing notes, pharmacy notes, consultant notes,  vitals, pertinent old records  I have discussed plan of care as described above with RN , patient and family on 02/14/2018   Albertine GratesFang Omarii Scalzo MD, PhD  [facility] Pager [phone]. If 7PM-7AM, please contact night-coverage at [email or web address], password [email or web address] 02/14/2018, 8:11 AM  LOS: 3 days

## 2018-02-14 NOTE — Progress Notes (Signed)
Subjective:  Patient denies any chest pain.  States breathing has improved  Objective:  Vital Signs in the last 24 hours: Temp:  [98.2 F (36.8 C)-98.9 F (37.2 C)] 98.2 F (36.8 C) (08/07 0726) Pulse Rate:  [86-90] 90 (08/07 0726) Resp:  [17-22] 17 (08/07 0726) BP: (142-157)/(72-125) 142/74 (08/07 0726) SpO2:  [93 %-97 %] 93 % (08/07 0814) Weight:  [82.5 kg (181 lb 14.1 oz)] 82.5 kg (181 lb 14.1 oz) (08/07 0500)  Intake/Output from previous day: 08/06 0701 - 08/07 0700 In: 1730 [P.O.:1380; IV Piggyback:350] Out: 1000 [Urine:1000] Intake/Output from this shift: No intake/output data recorded.  Physical Exam: Neck: no adenopathy, no carotid bruit, no JVD and supple, symmetrical, trachea midline Lungs: deDecreased breath sounds at bases with right lung rhonchi noted Heart: regular rate and rhythm, S1, S2 normal and 2/6 systolic murmur noted Abdomen: soft, non-tender; bowel sounds normal; no masses,  no organomegaly Extremities: extremities normal, atraumatic, no cyanosis or edema  Lab Results: Recent Labs    02/13/18 0335 02/14/18 1220  WBC 8.3 9.5  HGB 11.3* 12.3*  PLT 188 261   Recent Labs    02/12/18 0416 02/13/18 0335  NA 136 137  K 2.8* 3.8  CL 98 101  CO2 27 25  GLUCOSE 162* 149*  BUN 8 9  CREATININE 0.98 1.00   Recent Labs    02/13/18 1535 02/13/18 1942  TROPONINI 0.05* 0.05*   Hepatic Function Panel Recent Labs    02/12/18 0416 02/13/18 0335  PROT 6.2* 5.8*  ALBUMIN 2.9* 2.6*  AST 80* 88*  ALT 99* 106*  ALKPHOS 128* 153*  BILITOT 1.3* 1.5*  BILIDIR 0.3*  --   IBILI 1.0*  --    No results for input(s): CHOL in the last 72 hours. No results for input(s): PROTIME in the last 72 hours.  Imaging: Imaging results have been reviewed and Dg Chest 2 View  Result Date: 02/14/2018 CLINICAL DATA:  Pneumonia.  Follow-up. EXAM: CHEST - 2 VIEW COMPARISON:  02/13/2018 FINDINGS: Heart size is normal. Mediastinal shadows are normal. On the left, there is  a small effusion layering dependently. There is some atelectasis and/or patchy pneumonia in the left lower lobe. On the right, there is no effusion layering dependently. There is persistent consolidation in the right upper lobe consistent with bronchopneumonia, not significantly changed. IMPRESSION: Redemonstration of right upper lobe consolidation consistent with bronchopneumonia. Lateral view shows bilateral effusions and some atelectasis at the lung bases. Cannot rule out mild patchy infiltrate in the left lower lobe additionally. Electronically Signed   By: Paulina FusiMark  Shogry M.D.   On: 02/14/2018 10:01   Nm Hepato W/eject Fract  Result Date: 02/12/2018 CLINICAL DATA:  Abdominal pain with nausea and vomiting EXAM: NUCLEAR MEDICINE HEPATOBILIARY IMAGING WITH GALLBLADDER EF VIEWS: Anterior, right lateral right upper quadrant. RADIOPHARMACEUTICALS:  5.3 mCi Tc-10670m  Choletec IV COMPARISON:  Ultrasound right upper quadrant February 12, 2018 FINDINGS: Liver uptake of radiotracer is normal. There is prompt visualization of gallbladder and small bowel, indicating patency of the cystic and common bile ducts. The patient consumed 8 ounces of Ensure orally with calculation of the computer generated ejection fraction of radiotracer from the gallbladder. Patient did not experience clinical symptoms with the oral Ensure consumption. The computer generated ejection fraction of radiotracer from the gallbladder is normal at 75%, normal greater than 33% using the oral agent. IMPRESSION: Study within normal limits. Electronically Signed   By: Bretta BangWilliam  Woodruff III M.D.   On: 02/12/2018 13:38  Dg Chest Port 1 View  Result Date: 02/13/2018 CLINICAL DATA:  Follow-up pneumonia EXAM: PORTABLE CHEST 1 VIEW COMPARISON:  PA and lateral chest x-ray of February 11, 2018 FINDINGS: There is confluent airspace opacity in the right upper lobe which is more conspicuous today. The right middle and lower lobes appear clear as does the left lung. The  heart and pulmonary vascularity are normal. The trachea is midline. There is no pleural effusion. The bony thorax exhibits no acute abnormality. IMPRESSION: Persistent right upper lobe pneumonia with slightly increased parenchymal consolidation noted. No significant changes observed elsewhere. Electronically Signed   By: David  [place] M.D.   On: 02/13/2018 07:32    Cardiac Studies:  Assessment/Plan:  Minimally elevated troponin.  Probably secondary to demand ischemia.  Doubt significant MI. Right lung pneumonia. Multivessel CAD, status post PCI to LAD, left circumflex and RCA in the past. Hypertension. Diabetes mellitus. Hyperlipidemia. Remote tobacco abuse. Elevated LFTs secondary to statins/amiodarone Left foot drop. Status post hyponatremia Plan Continue present management. Antibiotics per primary team. No active cardiac issues at this point   LOS: 3 days    Rinaldo Cloud 02/14/2018, 1:18 PM

## 2018-02-14 NOTE — Progress Notes (Signed)
Physical Therapy Treatment Patient Details Name: Alexander Donaldson MRN: [id number] DOB: [birth year] Today's Date: 02/14/2018    History of Present Illness Pt is an 84 y.o. male admitted 02/11/18 with cough, fever, confusion; noted to have R middle lobe pneumonia. CT negative for acute abnromality. Of note, pt with recent fall, found to have L foot drop, but CT unremarkable at that time (01/11/18). PMH includes CAD, DM2, HTN, HLD.    PT Comments    Pt pleasant and agreeable to therapy today, excited to progress ambulation with AFO. Focus of therapy session donning and usage of AFO. Pt with min guard for transfers and ambulation. Pt able to self correct gait  instability initially and increased stability with increased distance. Pt reports feeling slippage in his shoe and his wife state she will contact the orthotist to improve fitting and possibly bring different shoe from home to try. D/c plans continue to remain appropriate at this time.    Follow Up Recommendations  Home health PT;Supervision for mobility/OOB     Equipment Recommendations  3in1 (PT)    Recommendations for Other Services       Precautions / Restrictions Precautions Precautions: Fall Precaution Comments: L foot drop (has AFO) Required Braces or Orthoses: Other Brace/Splint Other Brace/Splint: AFO Restrictions Weight Bearing Restrictions: No    Mobility  Bed Mobility               General bed mobility comments: Received sitting in recliner  Transfers Overall transfer level: Needs assistance Equipment used: None Transfers: Sit to/from Stand Sit to Stand: Min guard         General transfer comment: min guard for safety good power up and steadying, also good weightshift to seat AFO before ambulation   Ambulation/Gait Ambulation/Gait assistance: Min guard Gait Distance (Feet): 150 Feet Assistive device: None Gait Pattern/deviations: Step-through pattern;Decreased stride length Gait velocity:  Decreased Gait velocity interpretation: <1.8 ft/sec, indicate of risk for recurrent falls General Gait Details: Slow, slightly unsteady amb secondary to novel AFO usage improved with increased distance         Balance Overall balance assessment: Needs assistance Sitting-balance support: Feet unsupported Sitting balance-Leahy Scale: Good       Standing balance-Leahy Scale: Fair                              Cognition Arousal/Alertness: Awake/alert Behavior During Therapy: WFL for tasks assessed/performed Overall Cognitive Status: Impaired/Different from baseline Area of Impairment: Memory;Problem solving                     Memory: Decreased short-term memory         General Comments: pt attention improved, decreased memory for use of AFO       Exercises      General Comments General comments (skin integrity, edema, etc.): Wife present throughout session, pt on 3L O2 via nasal cannula with SaO2 91%O2, with ambulation SaO2 dropped to 86%O2, pt unable to recover with pursed lipped breathing and supplemental O2 increased to 4L/min, SaO2 rose to 92%O2, able to maintain oxygen saturation with ambulation and once back seated in recliner and recovered supplemental O2 reduced to 3 L /min and pt maintained 91%O2      Pertinent Vitals/Pain Pain Assessment: No/denies pain           PT Goals (current goals can now be found in the care plan section) Acute Rehab PT Goals Patient Stated  Goal: Return home and get better balance PT Goal Formulation: With patient Time For Goal Achievement: 02/27/18 Potential to Achieve Goals: Good Progress towards PT goals: Progressing toward goals    Frequency    Min 3X/week      PT Plan Current plan remains appropriate       AM-PAC PT "6 Clicks" Daily Activity  Outcome Measure  Difficulty turning over in bed (including adjusting bedclothes, sheets and blankets)?: None Difficulty moving from lying on back to  sitting on the side of the bed? : None Difficulty sitting down on and standing up from a chair with arms (e.g., wheelchair, bedside commode, etc,.)?: A Little Help needed moving to and from a bed to chair (including a wheelchair)?: A Little Help needed walking in hospital room?: A Little Help needed climbing 3-5 steps with a railing? : A Little 6 Click Score: 20    End of Session Equipment Utilized During Treatment: Gait belt;Other (comment)(L AFO) Activity Tolerance: Patient tolerated treatment well Patient left: in chair;with call bell/phone within reach;with family/visitor present Nurse Communication: Mobility status PT Visit Diagnosis: Other abnormalities of gait and mobility (R26.89)     Time: [id number] PT Time Calculation (min) (ACUTE ONLY): 31 min  Charges:  $Gait Training: 23-37 mins                     Shacara Cozine B. Beverely RisenVan Fleet PT, DPT Acute Rehabilitation  [phone] Pager [phone]     Elon Alaslizabeth B Van Fleet 02/14/2018, 1:45 PM

## 2018-02-14 NOTE — Progress Notes (Signed)
OT Cancellation Note  Patient Details Name: Alexander Donaldson MRN: [id number] DOB: 05/24/1934   Cancelled Treatment:    Reason Eval/Treat Not Completed: Fatigue/lethargy limiting ability to participate(Pt just went to sleep, wife asking that he not be disturbed.) Will continue to follow.  Alexander Donaldson, Alexander Donaldson 02/14/2018, 2:26 PM  02/14/2018 Martie RoundJulie Kelty Szafran, OTR/L Pager: [phone]

## 2018-02-14 NOTE — Progress Notes (Signed)
Dr. Roda ShuttersXu is ok stopping the ceftriaxone because urinary legionella turn positive. It has be very high specificity.    Ulyses SouthwardMinh Kierstyn Baranowski, PharmD, RivesBCIDP, AAHIVP, CPP Infectious Disease Pharmacist Pager: [phone] 02/14/2018 1:00 PM

## 2018-02-14 NOTE — Care Management Note (Addendum)
Case Management Note  Patient Details  Name: Alexander Donaldson MRN: [id number] Date of Birth: 03/14/1934  Subjective/Objective:    From home , presents with CAP, DM2, n/v, hypokalemia, htn, left foot drop, cad, acute encephalopathy.  Will need pt/ot eval.  If recs HH services , wife would like for patient to [facility] with [facility], and he will need rollator and 3 n 1.  [facility]  Soc will begin 24-48 hrs post dc.                                    Action/Plan: DC home when ready.  Expected Discharge Date:  02/14/18               Expected Discharge Plan:  Home w Home Health Services  In-House Referral:     Discharge planning Services  CM Consult  Post Acute Care Choice:  Durable Medical Equipment, Home Health Choice offered to:  Adult Children  DME Arranged:  3-N-1, Walker rolling with seat DME Agency:  [facility]  HH Arranged:  PT HH Agency[facility]  [facility]  Status of Service:  Completed, signed off  If discussed at Long Length of Stay Meetings, dates discussed:    Additional Comments:  [name], Jeydan Barner [place], RN 02/14/2018, 12:45 PM

## 2018-02-14 NOTE — Care Management Important Message (Signed)
Important Message  Patient Details  Name: Alexander Donaldson MRN: [id number] Date of Birth: 02/10/1934   Medicare Important Message Given:  Yes    Azzam Mehra 02/14/2018, 3:28 PM

## 2018-02-15 DIAGNOSIS — G9340 Encephalopathy, unspecified: Secondary | ICD-10-CM

## 2018-02-15 DIAGNOSIS — M21372 Foot drop, left foot: Secondary | ICD-10-CM

## 2018-02-15 DIAGNOSIS — I5022 Chronic systolic (congestive) heart failure: Secondary | ICD-10-CM

## 2018-02-15 LAB — COMPREHENSIVE METABOLIC PANEL
ALBUMIN: 2.5 g/dL — AB (ref 3.5–5.0)
ALT: 106 U/L — ABNORMAL HIGH (ref 0–44)
ANION GAP: 11 (ref 5–15)
AST: 82 U/L — AB (ref 15–41)
Alkaline Phosphatase: 191 U/L — ABNORMAL HIGH (ref 38–126)
BILIRUBIN TOTAL: 1.2 mg/dL (ref 0.3–1.2)
BUN: 9 mg/dL (ref 8–23)
CHLORIDE: 99 mmol/L (ref 98–111)
CO2: 26 mmol/L (ref 22–32)
Calcium: 8.1 mg/dL — ABNORMAL LOW (ref 8.9–10.3)
Creatinine, Ser: 0.83 mg/dL (ref 0.61–1.24)
GFR calc Af Amer: >60 mL/min (ref >60)
Glucose, Bld: 193 mg/dL — ABNORMAL HIGH (ref 70–99)
Potassium: 4.1 mmol/L (ref 3.5–5.1)
Sodium: 136 mmol/L (ref 135–145)
TOTAL PROTEIN: 5.9 g/dL — AB (ref 6.5–8.1)

## 2018-02-15 LAB — PHOSPHORUS: Phosphorus: 3.4 mg/dL (ref 2.5–4.6)

## 2018-02-15 LAB — MAGNESIUM: Magnesium: 2 mg/dL (ref 1.7–2.4)

## 2018-02-15 LAB — HEMOGLOBIN A1C
HEMOGLOBIN A1C: 6.9 % — AB (ref 4.8–5.6)
Mean Plasma Glucose: 151.33 mg/dL

## 2018-02-15 LAB — GLUCOSE, CAPILLARY
GLUCOSE-CAPILLARY: 203 mg/dL — AB (ref 70–99)
Glucose-Capillary: 162 mg/dL — ABNORMAL HIGH (ref 70–99)

## 2018-02-15 MED ORDER — FUROSEMIDE 20 MG PO TABS: 40.0000 mg | ORAL_TABLET | ORAL | 0 refills | Status: DC

## 2018-02-15 MED ORDER — AZITHROMYCIN 250 MG PO TABS: 250.0000 mg | ORAL_TABLET | Freq: Every day | ORAL | 0 refills | Status: AC

## 2018-02-15 MED ORDER — AZITHROMYCIN 250 MG PO TABS: 500.0000 mg | ORAL_TABLET | Freq: Every day | ORAL | Status: DC

## 2018-02-15 MED ORDER — ATORVASTATIN CALCIUM 40 MG PO TABS: 40.0000 mg | ORAL_TABLET | Freq: Every day | ORAL | 0 refills | Status: DC

## 2018-02-15 MED ORDER — SENNOSIDES-DOCUSATE SODIUM 8.6-50 MG PO TABS: 1.0000 | ORAL_TABLET | Freq: Every day | ORAL | 0 refills | Status: DC

## 2018-02-15 MED ORDER — GUAIFENESIN ER 600 MG PO TB12: 1200.0000 mg | ORAL_TABLET | Freq: Two times a day (BID) | ORAL | 0 refills | Status: DC

## 2018-02-15 MED ORDER — POTASSIUM CHLORIDE ER 10 MEQ PO TBCR: 20.0000 meq | EXTENDED_RELEASE_TABLET | ORAL | 0 refills | Status: DC

## 2018-02-15 MED FILL — AZITHROMYCIN 250 MG TABLET: 250 | 3 days supply | Qty: 3 | Fill #0

## 2018-02-15 MED FILL — POTASSIUM CL 10 MEQ TAB SA: 10 | 5 days supply | Qty: 6 | Fill #0

## 2018-02-15 MED FILL — FUROSEMIDE 40 MG TAB: 40 | 6 days supply | Qty: 3 | Fill #0

## 2018-02-15 NOTE — Progress Notes (Signed)
Discharge instructions given and reviewed with pt.  Prescription given.  Wife verbalized all instructions and pt most but states will review with wife anything he didn't get as is hard of hearing when arrives home.  Numbers given to make follow-up appointments as [facility] [place]  No s/s of distress at this time.  Left via WC for POV to home with wife.  Respirations even and unlabored on O2@2  L/min for home use.

## 2018-02-15 NOTE — Discharge Instructions (Signed)

## 2018-02-15 NOTE — Progress Notes (Signed)
SATURATION QUALIFICATIONS: (This note is used to comply with regulatory documentation for home oxygen)  Patient Saturations on Room Air at Rest = 89%  Patient Saturations on Room Air while Ambulating = 86%  Patient Saturations on 1 Liters of oxygen while Ambulating = 89% Patient Saturations on  2 Liters of oxygen while Ambulating = 95%  Please briefly explain why patient needs home oxygen: Sats dropped to 86% without O2.

## 2018-02-15 NOTE — Progress Notes (Signed)
Chaplain presented to patient's room to complete the Advance Directive. The patient had not completed the Advance Directive, he states he needs time to look over the document before he makes any decisions about. He is being discharged to today, Chaplain instructed him to call Spiritual Care if he needed any assistance with completing it, regarding  Notary and Volunteer services Chaplain Janell Quietudrey Jerrie Gullo [phone]

## 2018-02-15 NOTE — Progress Notes (Signed)
PHARMACIST - PHYSICIAN COMMUNICATION  DR:  Roda ShuttersXu  CONCERNING: IV to Oral Route Change Policy  RECOMMENDATION: This patient is receiving azithromycin by the intravenous route.  Based on criteria approved by the Pharmacy and Therapeutics Committee, the intravenous medication(s) is/are being converted to the equivalent oral dose form(s).   DESCRIPTION: These criteria include:  The patient is eating (either orally or via tube) and/or has been taking other orally administered medications for a least 24 hours  The patient has no evidence of active gastrointestinal bleeding or impaired GI absorption (gastrectomy, short bowel, patient on TNA or NPO).  If you have questions about this conversion, please contact the Pharmacy Department  []   [phone]( [phone] )  [facility] []   [phone]( [phone] )  [facility] [x]   [phone]( [phone] )  [facility] []   [phone]( [phone] )  [facility] []   [phone]( [phone] )  [facility]      Ilona Colley, PharmD, KindredBCIDP, [place]P, CPP Infectious Disease Pharmacist Pager: [phone] 02/15/2018 9:38 AM

## 2018-02-15 NOTE — Progress Notes (Signed)
Physical Therapy Treatment Patient Details Name: Alexander Donaldson MRN: [id number] DOB: 07/05/1934 Today's Date: 02/15/2018    History of Present Illness Pt is an 84 y.o. male admitted 02/11/18 with cough, fever, confusion; noted to have R middle lobe pneumonia. CT negative for acute abnromality. Of note, pt with recent fall, found to have L foot drop, but CT unremarkable at that time (01/11/18). PMH includes CAD, DM2, HTN, HLD.    PT Comments    Pt doing well today, incr gait tolerance; RN checked ambulatory O2 sats earlier and pt decr to 86% therefore SpO2 not rechecked on RA; session focused on incr gait/activity tolerance and equal wt shift with use of L AFO    Follow Up Recommendations  Home health PT;Supervision for mobility/OOB     Equipment Recommendations  3in1 (PT)    Recommendations for Other Services       Precautions / Restrictions Precautions Precautions: Fall Precaution Comments: L foot drop (has AFO) Required Braces or Orthoses: Other Brace/Splint Other Brace/Splint: AFO Restrictions Weight Bearing Restrictions: No    Mobility  Bed Mobility               General bed mobility comments: Received sitting in recliner  Transfers Overall transfer level: Needs assistance Equipment used: None Transfers: Sit to/from Stand Sit to Stand: Min guard         General transfer comment: min guard for safety good power up and steadying on rising  Ambulation/Gait Ambulation/Gait assistance: Min guard Gait Distance (Feet): 300 Feet Assistive device: None Gait Pattern/deviations: Step-through pattern;Decreased stride length;Decreased weight shift to left Gait velocity: Decreased   General Gait Details: guarded initially, slightly unsteady amb secondary to novel AFO usage,  improved with increased distance; one standing break d/t dyspnea; SpO2= 94-96% on 2L throughout   Stairs             Wheelchair Mobility    Modified Rankin (Stroke Patients Only)        Balance     Sitting balance-Leahy Scale: Good       Standing balance-Leahy Scale: Fair Standing balance comment: amb without AD, intermittent need for single UE support, unable to tolerate challenges                            Cognition Arousal/Alertness: Awake/alert Behavior During Therapy: WFL for tasks assessed/performed                                          Exercises      General Comments General comments (skin integrity, edema, etc.): wife present, supportive; L shoe adjusted and tightened during gait and pt reported decr slipping      Pertinent Vitals/Pain Pain Assessment: No/denies pain    Home Living                      Prior Function            PT Goals (current goals can now be found in the care plan section) Acute Rehab PT Goals Patient Stated Goal: Return home and get better balance PT Goal Formulation: With patient Time For Goal Achievement: 02/27/18 Potential to Achieve Goals: Good Progress towards PT goals: Progressing toward goals    Frequency    Min 3X/week      PT Plan Current plan remains appropriate  Co-evaluation              AM-PAC PT "6 Clicks" Daily Activity  Outcome Measure  Difficulty turning over in bed (including adjusting bedclothes, sheets and blankets)?: None Difficulty moving from lying on back to sitting on the side of the bed? : None Difficulty sitting down on and standing up from a chair with arms (e.g., wheelchair, bedside commode, etc,.)?: A Little Help needed moving to and from a bed to chair (including a wheelchair)?: A Little Help needed walking in hospital room?: A Little Help needed climbing 3-5 steps with a railing? : A Little 6 Click Score: 20    End of Session Equipment Utilized During Treatment: Gait belt;Other (comment)(L AFO) Activity Tolerance: Patient tolerated treatment well Patient left: in chair;with call bell/phone within reach;with  family/visitor present Nurse Communication: Mobility status PT Visit Diagnosis: Other abnormalities of gait and mobility (R26.89)     Time: 1610-9604 PT Time Calculation (min) (ACUTE ONLY): 18 min  Charges:  $Gait Training: 8-22 mins                     Drucilla Chalet, PT Pager: [phone] 02/15/2018    [facility] 02/15/2018, 1:41 PM

## 2018-02-15 NOTE — Progress Notes (Signed)
Subjective:  Patient denies any chest pain states breathing and coughing has improved. Clinically improved  Objective:  Vital Signs in the last 24 hours: Temp:  [97.9 F (36.6 C)-98.2 F (36.8 C)] 97.9 F (36.6 C) (08/08 0758) Pulse Rate:  [81-87] 81 (08/08 0758) Resp:  [16-19] 16 (08/08 0758) BP: (141-154)/(69-73) 141/69 (08/08 0758) SpO2:  [91 %-96 %] 96 % (08/08 0758)  Intake/Output from previous day: 08/07 0701 - 08/08 0700 In: 608.5 [IV Piggyback:608.5] Out: 1150 [Urine:1150] Intake/Output from this shift: Total I/O In: 120 [P.O.:120] Out: -   Physical Exam: Neck: no adenopathy, no carotid bruit, no JVD and supple, symmetrical, trachea midline Lungs: Decreased breath sound at bases with right lung occasional rhonchi air entry improved Heart: regular rate and rhythm, S1, S2 normal and 2/6 systolic murmur noted no S3 gallop Abdomen: soft, non-tender; bowel sounds normal; no masses,  no organomegaly Extremities: extremities normal, atraumatic, no cyanosis or edema  Lab Results: Recent Labs    02/13/18 0335 02/14/18 1220  WBC 8.3 9.5  HGB 11.3* 12.3*  PLT 188 261   Recent Labs    02/13/18 0335 02/15/18 0353  NA 137 136  K 3.8 4.1  CL 101 99  CO2 25 26  GLUCOSE 149* 193*  BUN 9 9  CREATININE 1.00 0.83   Recent Labs    02/13/18 1535 02/13/18 1942  TROPONINI 0.05* 0.05*   Hepatic Function Panel Recent Labs    02/15/18 0353  PROT 5.9*  ALBUMIN 2.5*  AST 82*  ALT 106*  ALKPHOS 191*  BILITOT 1.2   No results for input(s): CHOL in the last 72 hours. No results for input(s): PROTIME in the last 72 hours.  Imaging: Imaging results have been reviewed and Dg Chest 2 View  Result Date: 02/14/2018 CLINICAL DATA:  Pneumonia.  Follow-up. EXAM: CHEST - 2 VIEW COMPARISON:  02/13/2018 FINDINGS: Heart size is normal. Mediastinal shadows are normal. On the left, there is a small effusion layering dependently. There is some atelectasis and/or patchy pneumonia in  the left lower lobe. On the right, there is no effusion layering dependently. There is persistent consolidation in the right upper lobe consistent with bronchopneumonia, not significantly changed. IMPRESSION: Redemonstration of right upper lobe consolidation consistent with bronchopneumonia. Lateral view shows bilateral effusions and some atelectasis at the lung bases. Cannot rule out mild patchy infiltrate in the left lower lobe additionally. Electronically Signed   By: Paulina Fusi M.D.   On: 02/14/2018 10:01   Ct Chest Wo Contrast  Result Date: 02/14/2018 CLINICAL DATA:  84 year old male with a history of decreased breath sounds EXAM: CT CHEST WITHOUT CONTRAST TECHNIQUE: Multidetector CT imaging of the chest was performed following the standard protocol without IV contrast. COMPARISON:  Plain film 02/13/2018, 02/14/2018 FINDINGS: Cardiovascular: Heart size within normal limits. Mild pericardial fluid/thickening, most pronounced anteriorly. Calcifications of the aortic valve. Dense calcifications of left main, left anterior descending, circumflex, right coronary arteries. Calcifications of the aortic arch and descending thoracic aorta. No aneurysm. Mediastinum/Nodes: Multiple borderline enlarged lymph nodes of the mediastinum, as well as the right hilum. Index nodes in the lowest paratracheal nodal station measures 10 mm. Unremarkable course of the thoracic esophagus. Lungs/Pleura: No endotracheal or endobronchial debris. Confluent airspace disease with interlobular septal thickening of the right upper lobe extending towards the periphery. Classic CT air bronchograms. Focal ground-glass and nodular opacity of the left upper lobe, right middle lobe, and bilateral lower lobes. Small bilateral pleural effusions. Upper Abdomen: No acute finding of  the upper abdomen. Incidental partial imaging of low-density lesion of the left kidney, most likely a cyst. Musculoskeletal: No acute displaced fracture. Advanced disc  disease of T12-L1 with subchondral cyst formation/small nodes and vacuum disc phenomenon. No bony canal narrowing of the thoracic spine. IMPRESSION: Multifocal pneumonia, predominantly of the right upper lobe, with parapneumonic effusions and reactive mediastinal adenopathy. Left main and 3 vessel coronary artery disease with associated aortic atherosclerosis. Aortic Atherosclerosis (ICD10-I70.0). Electronically Signed   By: Gilmer MorJaime  Wagner D.O.   On: 02/14/2018 20:41    Cardiac Studies:  Assessment/Plan:  Minimally elevated troponin. Probably secondary to demand ischemia. Doubt significant MI. Resolving multi lobar pneumonia Multivessel CAD, status post PCI to LAD, left circumflex and RCA in the past. Hypertension. Diabetes mellitus. Hyperlipidemia. Remote tobacco abuse. Elevated LFTs secondary to statins/amiodarone Left foot drop. Status post hyponatremia Plan Continue present management I will sign off please call if needed Follow-up with me in 2 weeks  LOS: 4 days    Rinaldo CloudHarwani, Alfreddie Consalvo 02/15/2018, 10:26 AM

## 2018-02-15 NOTE — Discharge Summary (Signed)
Discharge Summary  Alexander Donaldson NWG:[id number] DOB: [birth year]  PCP: Dellia Nims, MD  Admit date: 02/11/2018 Discharge date: 02/15/2018  Time spent: , more than 50% time spent on coordination of care  Recommendations for Outpatient Follow-up:  1. F/u with PMD within a week  for hospital discharge follow up, repeat cbc/cmp at follow up 2. F/u with Dr Sharyn Lull 3. Home health arranged, home o2 arranged (chronic systolic chf, multifocal pnemonia) 4. New meds : zithrmax for another three days, lasix with potassium supplement MWF x3 doses.  5. Amiodarone discontinued and lipitor dose decreased due to elevated lft  Discharge Diagnoses:  Active Hospital Problems   Diagnosis Date Noted  . CAP (community acquired pneumonia) 02/11/2018  . Acute encephalopathy 02/12/2018  . CAD (coronary artery disease) 02/12/2018  . Left foot drop 02/12/2018  . Type 2 diabetes mellitus with vascular disease (HCC) 02/12/2018  . Essential hypertension 02/12/2018  . Elevated LFTs 02/12/2018  . Nausea & vomiting 02/12/2018  . Hypokalemia 02/12/2018    Resolved Hospital Problems  No resolved problems to display.    Discharge Condition: stable  Diet recommendation: heart healthy/carb modified  Filed Weights   02/12/18 0300 02/13/18 0246 02/14/18 0500  Weight: 80.9 kg 88.2 kg 82.5 kg    History of present illness: (per admitting MD Dr Toniann Fail) PCP: Dellia Nims, MD  Patient coming from: Home.  Chief Complaint: Confusion.  HPI: Alexander Donaldson is a 84 y.o. male with history of CAD status post stenting in July 2018, diabetes mellitus type 2, hypertension, hyperlipidemia who had recent fall and also was found to have left foot drop almost a month ago and at the time patient had come to the ER and CT head was unremarkable.  The patient's wife noticed foot drop only from July 17.  About 3 weeks ago.  2 days ago patient had an episode of nausea vomiting with no abdominal pain while having  lunch.  Patient was able to eat supper without any difficulty.  Yesterday after a nap in the afternoon when patient woke up he looked confused and was febrile and has had a productive cough.  Confusion lasted for a few minutes then resolved.  Patient had fever and was brought to the ER.  ED Course: In the ER patient was febrile and had lactate elevated and chest x-ray was showing pneumonic process.  Labs showed hyponatremia with elevated LFTs but abdomen appears benign.  By the time I examined patient is back to baseline without any confusion and is moving all extremities.  Patient was started on empiric antibiotics for pneumonia and admitted for further management.  Patient also has severe hypokalemia.  Hospital Course:  Principal Problem:   CAP (community acquired pneumonia) Active Problems:   Acute encephalopathy   CAD (coronary artery disease)   Left foot drop   Type 2 diabetes mellitus with vascular disease (HCC)   Essential hypertension   Elevated LFTs   Nausea & vomiting   Hypokalemia   Lobar PNA/acute hypoxic respiratory failure: -+legionella -he is treated with zithromax, clincally improving -he walked with PT today, o2 dropped to 86% on 3liter o2, he was previously not o2 dependent, he need to go hone on o2 at discharge ( also has chronic systolic chf)  Right upper lobe persistent consolidation on repeat cxr, patient reports has been coughing for two months ct chest + multifocal pneumonia, need to repeat cxr in 3-4 weeks  Acute metabolic encephalopathy with likely baseline dementia: Acute encephalopathy Likely  from infection Ct head no acute findings.ammonia unrekaralbe Mental status back to baseline, very pleasant, not oriented to time but to place and person.  Hypokalemia/hypophosphatemia/hypomagnesemia -replaced and normalized   lft elevation -Hepatitis panel negative -Liver US no acute findings (. No acute abnormality seen to explain the patient's  symptoms. 2. Cholelithiasis. Borderline prominence of the gallbladder wall may reflect chronic inflammation. No definite evidence of cholecystitis. 3. 4.0 cm left renal cyst noted. -From infection in addition to being on statin and amiodarone. Currently off amidoarone, on  Reduced dose statin per cardiology Repeat lab at hospital discharge follow up. He denies ab pain, no n/v.  Liver congestion from ISCHEMIA cardiomyopathy /chronic systolic chf could also be on differential -Per last cath in 2018 'The left ventricular ejection fraction is 35-45% by visual estimate -HE DOES has mild bilateral pleural effusion, he does not have significant lower extremity on exam but he has been mostly in bed since in the hospital, He is discharged on lasix mwf for three doses, he is to follow up with pcpc and cardiology Dr Sharyn Lull closely.   H/o CAD s/p multiple stents in the past,  -currently denies chest pain,  Mild troponin elevation thought due to demand ischemia  -C/w Aspirin 81 mg p.o. daily,Atorvastatin 40 mg p.o. daily,Clopidogrel 75 mg p.o. daily,Losartan 100 mg p.o. nightly,Metoprolol succinate 50 mg p.o. nightly,Nitroglycerin 0.4 mg sublingual every 5 minutes as needed chest pain -cardiology Dr Sharyn Lull input appreciated   noninsulin dependent dm2,  a1c 6.9 On ssi here Home oral meds held in the hospital, resumed at discharge  H/o spinal stenosis, siatica, intermittent back pain, recently started to have left foot drop for which he has a left brace on He declined referral to neurospine, he declined possible steroids injection, he prefer conservative management This is confirmed with wife at bedside  FTT: PT recommended home health    Code Status: full  Family Communication: patient and wife at bedside  Disposition Plan: home with home health , home o2   Consultants:  cardiology  Procedures:  none  Antibiotics:  As above    Discharge Exam: BP (!)  141/69 (BP Location: Left Arm)   Pulse 81   Temp 97.9 F (36.6 C) (Oral)   Resp 16   Ht 5\' 3"  (1.6 m)   Wt 82.5 kg   SpO2 96%   BMI 32.22 kg/m   General: NAD, not oriented to time at baseline, he is very pleasant Cardiovascular: RRR Respiratory: improved breath sounds,  No wheezing, no rales, no rhonchi  Discharge Instructions You were cared for by a hospitalist during your hospital stay. If you have any questions about your discharge medications or the care you received while you were in the hospital after you are discharged, you can call the unit and asked to speak with the hospitalist on call if the hospitalist that took care of you is not available. Once you are discharged, your primary care physician will handle any further medical issues. Please note that NO REFILLS for any discharge medications will be authorized once you are discharged, as it is imperative that you return to your primary care physician (or establish a relationship with a primary care physician if you do not have one) for your aftercare needs so that they can reassess your need for medications and monitor your lab values.  Discharge Instructions    Diet - low sodium heart healthy   Complete by:  As directed    Carb modified diet  Increase activity slowly   Complete by:  As directed      Allergies as of 02/15/2018   No Known Allergies     Medication List    STOP taking these medications   amiodarone 200 MG tablet Commonly known as:  PACERONE   DELSYM 30 MG/5ML liquid Generic drug:  dextromethorphan     TAKE these medications   amLODipine 5 MG tablet Commonly known as:  NORVASC Take 1 tablet (5 mg total) by mouth daily. What changed:  when to take this   aspirin 81 MG tablet Take 81 mg by mouth daily.   atorvastatin 40 MG tablet Commonly known as:  LIPITOR Take 1 tablet (40 mg total) by mouth daily at 6 PM. What changed:    medication strength  how much to take  when to take this    azithromycin 250 MG tablet Commonly known as:  ZITHROMAX Take 1 tablet (250 mg total) by mouth daily for 3 days.   clopidogrel 75 MG tablet Commonly known as:  PLAVIX Take 75 mg by mouth daily.   diclofenac sodium 1 % Gel Commonly known as:  VOLTAREN Apply 2 g topically 4 (four) times daily as needed (pain).   Fish Oil 1000 MG Caps Take 1,000 mg by mouth 2 (two) times daily.   furosemide 20 MG tablet Commonly known as:  LASIX Take 2 tablets (40 mg total) by mouth every Monday, Wednesday, and Friday for 6 days. Start taking on:  02/16/2018 What changed:    how much to take  when to take this   glipiZIDE 10 MG tablet Commonly known as:  GLUCOTROL Take 10 mg by mouth 2 (two) times daily before a meal. Take 30 minutes before meals   guaiFENesin 600 MG 12 hr tablet Commonly known as:  MUCINEX Take 2 tablets (1,200 mg total) by mouth 2 (two) times daily.   hydroxypropyl methylcellulose / hypromellose 2.5 % ophthalmic solution Commonly known as:  ISOPTO TEARS / GONIOVISC Place 1 drop into both eyes 4 (four) times daily as needed for dry eyes.   losartan 50 MG tablet Commonly known as:  COZAAR Take 1 tablet (50 mg total) by mouth daily. What changed:    how much to take  when to take this   metoprolol succinate 25 MG 24 hr tablet Commonly known as:  TOPROL-XL Take 50 mg by mouth every evening.   nitroGLYCERIN 0.4 MG SL tablet Commonly known as:  NITROSTAT Place 1 tablet (0.4 mg total) under the tongue every 5 (five) minutes x 3 doses as needed for chest pain.   potassium chloride 10 MEQ tablet Commonly known as:  K-DUR Take 2 tablets (20 mEq total) by mouth every Monday, Wednesday, and Friday for 5 days. Start taking on:  02/16/2018 What changed:  when to take this   psyllium 95 % Pack Commonly known as:  HYDROCIL/METAMUCIL Take 1 packet by mouth 2 (two) times daily.   senna-docusate 8.6-50 MG tablet Commonly known as:  Senokot-S Take 1 tablet by mouth at  bedtime.            Durable Medical Equipment  (From admission, onward)         Start     Ordered   02/15/18 1027  For home use only DME Walker platform  [facility])  Once    Question:  Patient needs a walker to treat with the following condition  Answer:  FTT (failure to thrive) in adult   02/15/18 1026  02/15/18 1027  DME Oxygen  Once    Question Answer Comment  Mode or (Route) Nasal cannula   Liters per Minute 2   Frequency Continuous (stationary and portable oxygen unit needed)   Oxygen conserving device Yes   Oxygen delivery system Gas      02/15/18 1026   02/14/18 1245  For home use only DME 4 wheeled rolling walker with seat  Once    Question:  Patient needs a walker to treat with the following condition  Answer:  Weakness   02/14/18 1244   02/14/18 1129  For home use only DME 3 n 1  Once     02/14/18 1128         No Known Allergies Follow-up Information    [facility], [facility] Follow up.   Specialty:  Home Health Services Why:  HHPT Contact information: [address] [place] [address] [phone]        Keplinger, Tawny Asal, MD Follow up.   Specialty:  Internal Medicine Why:  hospital discharge follow up, repeat cbc/bmp /liver function at follow up. repeat cxr in 3-4 weeks Contact information: [address] [place] [address] [phone]        Rinaldo Cloud, MD Follow up.   Specialty:  Cardiology Contact information: [address] [place] [address] [phone]            The results of significant diagnostics from this hospitalization (including imaging, microbiology, ancillary and laboratory) are listed below for reference.    Significant Diagnostic Studies: Dg Chest 2 View  Result Date: 02/14/2018 CLINICAL DATA:  Pneumonia.  Follow-up. EXAM: CHEST - 2 VIEW COMPARISON:  02/13/2018 FINDINGS: Heart size is normal. Mediastinal shadows are normal. On the left, there is a small effusion layering  dependently. There is some atelectasis and/or patchy pneumonia in the left lower lobe. On the right, there is no effusion layering dependently. There is persistent consolidation in the right upper lobe consistent with bronchopneumonia, not significantly changed. IMPRESSION: Redemonstration of right upper lobe consolidation consistent with bronchopneumonia. Lateral view shows bilateral effusions and some atelectasis at the lung bases. Cannot rule out mild patchy infiltrate in the left lower lobe additionally. Electronically Signed   By: Paulina Fusi M.D.   On: 02/14/2018 10:01   Dg Chest 2 View  Result Date: 02/11/2018 CLINICAL DATA:  Acute onset of confusion and fever. Urinary incontinence. EXAM: CHEST - 2 VIEW COMPARISON:  Chest radiograph performed 01/11/2018 FINDINGS: New right midlung airspace opacification is compatible with pneumonia. No pleural effusion or pneumothorax is seen. The heart is normal in size. No acute osseous abnormalities are identified. There is chronic superior subluxation of both humeral heads, reflecting chronic rotator cuff tears. Calcification is noted along the abdominal aorta. IMPRESSION: New right midlung airspace opacification is compatible with pneumonia. Electronically Signed   By: Roanna Raider M.D.   On: 02/11/2018 21:04   Ct Head Wo Contrast  Result Date: 02/12/2018 CLINICAL DATA:  84 year old male with altered mental status. Initial encounter. EXAM: CT HEAD WITHOUT CONTRAST TECHNIQUE: Contiguous axial images were obtained from the base of the skull through the vertex without intravenous contrast. COMPARISON:  01/11/2018 head CT.  01/24/2013 brain MR. FINDINGS: Brain: No intracranial hemorrhage or CT evidence of large acute infarct. Chronic microvascular changes. Mild to moderate global atrophy. No intracranial mass lesion noted on this unenhanced exam. Vascular: Vascular calcifications Skull: No acute abnormality. Sinuses/Orbits: Mild exophthalmos. Visualized paranasal  sinuses are clear.  Other: Mastoid air cells and middle ear cavities are clear. IMPRESSION: 1. No intracranial hemorrhage or CT evidence of large acute infarct. 2. Chronic microvascular changes. 3. Mild to moderate global atrophy. Electronically Signed   By: Lacy DuverneySteven  Olson M.D.   On: 02/12/2018 09:16   Ct Chest Wo Contrast  Result Date: 02/14/2018 CLINICAL DATA:  84 year old male with a history of decreased breath sounds EXAM: CT CHEST WITHOUT CONTRAST TECHNIQUE: Multidetector CT imaging of the chest was performed following the standard protocol without IV contrast. COMPARISON:  Plain film 02/13/2018, 02/14/2018 FINDINGS: Cardiovascular: Heart size within normal limits. Mild pericardial fluid/thickening, most pronounced anteriorly. Calcifications of the aortic valve. Dense calcifications of left main, left anterior descending, circumflex, right coronary arteries. Calcifications of the aortic arch and descending thoracic aorta. No aneurysm. Mediastinum/Nodes: Multiple borderline enlarged lymph nodes of the mediastinum, as well as the right hilum. Index nodes in the lowest paratracheal nodal station measures 10 mm. Unremarkable course of the thoracic esophagus. Lungs/Pleura: No endotracheal or endobronchial debris. Confluent airspace disease with interlobular septal thickening of the right upper lobe extending towards the periphery. Classic CT air bronchograms. Focal ground-glass and nodular opacity of the left upper lobe, right middle lobe, and bilateral lower lobes. Small bilateral pleural effusions. Upper Abdomen: No acute finding of the upper abdomen. Incidental partial imaging of low-density lesion of the left kidney, most likely a cyst. Musculoskeletal: No acute displaced fracture. Advanced disc disease of T12-L1 with subchondral cyst formation/small nodes and vacuum disc phenomenon. No bony canal narrowing of the thoracic spine. IMPRESSION: Multifocal pneumonia, predominantly of the right upper lobe, with  parapneumonic effusions and reactive mediastinal adenopathy. Left main and 3 vessel coronary artery disease with associated aortic atherosclerosis. Aortic Atherosclerosis (ICD10-I70.0). Electronically Signed   By: Gilmer MorJaime  Wagner D.O.   On: 02/14/2018 20:41   [place] Abdomen Complete  Result Date: 02/12/2018 CLINICAL DATA:  Acute onset of nausea and vomiting. EXAM: ABDOMEN ULTRASOUND COMPLETE COMPARISON:  None. FINDINGS: Gallbladder: Stones are noted within the gallbladder, measuring up to 8 mm in size. The gallbladder wall is borderline prominent. No ultrasonographic Murphy's sign is elicited. No pericholecystic fluid is seen. Common bile duct: Diameter: 0.3 cm, within normal limits in caliber. Liver: No focal lesion identified. Within normal limits in parenchymal echogenicity. Portal vein is patent on color Doppler imaging with normal direction of blood flow towards the liver. IVC: No abnormality visualized. Pancreas: Not well characterized due to overlying bowel gas. Spleen: Size and appearance within normal limits. Right Kidney: Length: 11.0 cm. Echogenicity within normal limits. No mass or hydronephrosis visualized. Left Kidney: Length: 10.2 cm. Echogenicity within normal limits. A 4.0 cm cyst is partially imaged at the left kidney. No hydronephrosis visualized. Abdominal aorta: No aneurysm visualized. Other findings: None. IMPRESSION: 1. No acute abnormality seen to explain the patient's symptoms. 2. Cholelithiasis. Borderline prominence of the gallbladder wall may reflect chronic inflammation. No definite evidence of cholecystitis. 3. 4.0 cm left renal cyst noted. Electronically Signed   By: Roanna RaiderJeffery  Chang M.D.   On: 02/12/2018 04:04   Nm Hepato W/eject Fract  Result Date: 02/12/2018 CLINICAL DATA:  Abdominal pain with nausea and vomiting EXAM: NUCLEAR MEDICINE HEPATOBILIARY IMAGING WITH GALLBLADDER EF VIEWS: Anterior, right lateral right upper quadrant. RADIOPHARMACEUTICALS:  5.3 mCi Tc-2641m  Choletec IV  COMPARISON:  Ultrasound right upper quadrant February 12, 2018 FINDINGS: Liver uptake of radiotracer is normal. There is prompt visualization of gallbladder and small bowel, indicating patency of the cystic and common bile ducts. The patient  consumed 8 ounces of Ensure orally with calculation of the computer generated ejection fraction of radiotracer from the gallbladder. Patient did not experience clinical symptoms with the oral Ensure consumption. The computer generated ejection fraction of radiotracer from the gallbladder is normal at 75%, normal greater than 33% using the oral agent. IMPRESSION: Study within normal limits. Electronically Signed   By: Bretta Bang III M.D.   On: 02/12/2018 13:38   Dg Chest Port 1 View  Result Date: 02/13/2018 CLINICAL DATA:  Follow-up pneumonia EXAM: PORTABLE CHEST 1 VIEW COMPARISON:  PA and lateral chest x-ray of February 11, 2018 FINDINGS: There is confluent airspace opacity in the right upper lobe which is more conspicuous today. The right middle and lower lobes appear clear as does the left lung. The heart and pulmonary vascularity are normal. The trachea is midline. There is no pleural effusion. The bony thorax exhibits no acute abnormality. IMPRESSION: Persistent right upper lobe pneumonia with slightly increased parenchymal consolidation noted. No significant changes observed elsewhere. Electronically Signed   By: David  [place] M.D.   On: 02/13/2018 07:32    Microbiology: Recent Results (from the past 240 hour(s))  Blood culture (routine x 2)     Status: None (Preliminary result)   Collection Time: 02/11/18  8:30 PM  Result Value Ref Range Status   Specimen Description BLOOD LEFT ARM  Final   Special Requests   Final    BOTTLES DRAWN AEROBIC AND ANAEROBIC Blood Culture results may not be optimal due to an inadequate volume of blood received in culture bottles   Culture   Final    NO GROWTH 3 DAYS Performed at [facility], [address],  [place], [place] [address]    Report Status PENDING  Incomplete  Blood culture (routine x 2)     Status: None (Preliminary result)   Collection Time: 02/11/18  9:30 PM  Result Value Ref Range Status   Specimen Description BLOOD LEFT HAND  Final   Special Requests   Final    BOTTLES DRAWN AEROBIC AND ANAEROBIC Blood Culture adequate volume   Culture   Final    NO GROWTH 3 DAYS Performed at [facility], [address] [place], [place] [address]    Report Status PENDING  Incomplete  Culture, sputum-assessment     Status: None   Collection Time: 02/12/18  1:48 PM  Result Value Ref Range Status   Specimen Description SPUTUM  Final   Special Requests NONE  Final   Sputum evaluation   Final    THIS SPECIMEN IS ACCEPTABLE FOR SPUTUM CULTURE Performed at [facility], [address], [place], [place] [address]    Report Status 02/12/2018 FINAL  Final  Culture, respiratory     Status: None   Collection Time: 02/12/18  1:48 PM  Result Value Ref Range Status   Specimen Description SPUTUM  Final   Special Requests NONE Reflexed from [id number]  Final   Gram Stain   Final    RARE WBC PRESENT,BOTH PMN AND MONONUCLEAR FEW GRAM POSITIVE COCCI IN PAIRS IN CLUSTERS RARE GRAM POSITIVE RODS    Culture   Final    Consistent with normal respiratory flora. Performed at [facility], [address] [place], [place] [address]    Report Status 02/14/2018 FINAL  Final     Labs: Basic Metabolic Panel: Recent Labs  Lab 02/11/18 2028 02/12/18 0416 02/12/18 1224 02/13/18 0335 02/15/18 0353  NA 128* 136  --  137 136  K 2.9* 2.8*  --  3.8 4.1  CL 91* 98  --  101 99  CO2 26 27  --  25 26  GLUCOSE 196* 162*  --  149* 193*  BUN 11 8  --  9 9  CREATININE 1.07 0.98  --  1.00 0.83  CALCIUM 8.2* 7.9*  --  7.7* 8.1*  MG  --   --  1.8 1.9 2.0  PHOS  --   --   --  1.9* 3.4   Liver Function Tests: Recent Labs  Lab 02/11/18 2028 02/12/18 0416 02/13/18 0335 02/15/18 0353  AST 83* 80*  88* 82*  ALT 108* 99* 106* 106*  ALKPHOS 137* 128* 153* 191*  BILITOT 1.7* 1.3* 1.5* 1.2  PROT 7.0 6.2* 5.8* 5.9*  ALBUMIN 3.4* 2.9* 2.6* 2.5*   Recent Labs  Lab 02/13/18 0918  LIPASE 46   Recent Labs  Lab 02/12/18 0701  AMMONIA 35   CBC: Recent Labs  Lab 02/11/18 2028 02/12/18 0416 02/13/18 0335 02/14/18 1220  WBC 9.0 7.4 8.3 9.5  NEUTROABS 6.3 4.9 5.7  --   HGB 12.8* 11.9* 11.3* 12.3*  HCT 38.0* 34.8* 32.9* 36.4*  MCV 90.0 89.2 89.2 89.4  PLT 199 163 188 261   Cardiac Enzymes: Recent Labs  Lab 02/12/18 1224 02/12/18 1831 02/13/18 0918 02/13/18 1535 02/13/18 1942  TROPONINI 0.07* 0.12* 0.07* 0.05* 0.05*   BNP: BNP (last 3 results) Recent Labs    01/20/18 1639  BNP 63.6    ProBNP (last 3 results) No results for input(s): PROBNP in the last 8760 hours.  CBG: Recent Labs  Lab 02/14/18 1155 02/14/18 1648 02/14/18 2133 02/15/18 0759 02/15/18 1156  GLUCAP 205* 190* 159* 162* 203*       Signed:  Albertine Grates MD, PhD  [facility] 02/15/2018, 11:57 AM

## 2018-02-15 NOTE — Care Management Note (Signed)
Case Management Note  Patient Details  Name: Alexander Donaldson MRN: [id number] Date of Birth: 11/09/1933  Subjective/Objective:    From home , presents with CAP, DM2, n/v, hypokalemia, htn, left foot drop, cad, acute encephalopathy.  Will need pt/ot eval.  If recs HH services , wife would like for [facility] to [facility] with [facility], and he will need rollator and 3 n 1.  [facility].  Soc will begin 24-48 hrs post dc  8/8 [name] RN, BSN - patient for dc today, will need home oxygen and [facility] .  [facility], 3 n 1, and rollator.                 Action/Plan: DC home when oxygen and DME delivered to patient's room.  Expected Discharge Date:  02/15/18               Expected Discharge Plan:  Home w Home Health Services  In-House Referral:     Discharge planning Services  CM Consult  Post Acute Care Choice:  Durable Medical Equipment, Home Health Choice offered to:  Spouse  DME Arranged:  3-N-1, Walker rolling with seat, Oxygen DME Agency:  [facility]  HH Arranged:  PT [facility]  Status of Service:  Completed, signed off  If discussed at Long Length of Stay Meetings, dates discussed:    Additional Comments:  [name], [name], RN 02/15/2018, 10:30 AM

## 2018-02-16 LAB — CULTURE, BLOOD (ROUTINE X 2)
CULTURE: NO GROWTH
Culture: NO GROWTH
SPECIAL REQUESTS: ADEQUATE

## 2018-03-02 MED FILL — NITROGLYCERIN 0.4 MG TAB SL: 0.4 | 25 days supply | Qty: 25 | Fill #0

## 2018-04-02 ENCOUNTER — Ambulatory Visit (HOSPITAL_COMMUNITY)
Admission: RE | Admit: 2018-04-02 | Discharge: 2018-04-02 | Disposition: A | Discharge status: Home / Self Care | Payer: Medicare Other | Source: Ambulatory Visit | Attending: Cardiology | Admitting: Cardiology

## 2018-04-02 ENCOUNTER — Other Ambulatory Visit: Payer: Self-pay | Admitting: Cardiology

## 2018-04-02 DIAGNOSIS — J189 Pneumonia, unspecified organism: Secondary | ICD-10-CM

## 2018-09-10 ENCOUNTER — Ambulatory Visit: Payer: Non-veteran care | Admitting: Primary Care

## 2018-09-13 ENCOUNTER — Ambulatory Visit (INDEPENDENT_AMBULATORY_CARE_PROVIDER_SITE_OTHER): Payer: Medicare Other | Admitting: Primary Care

## 2018-09-13 ENCOUNTER — Encounter: Payer: Self-pay | Admitting: Primary Care

## 2018-09-13 ENCOUNTER — Ambulatory Visit (INDEPENDENT_AMBULATORY_CARE_PROVIDER_SITE_OTHER)
Admission: RE | Admit: 2018-09-13 | Discharge: 2018-09-13 | Disposition: A | Discharge status: Home / Self Care | Payer: Medicare Other | Source: Ambulatory Visit | Attending: Primary Care | Admitting: Primary Care

## 2018-09-13 VITALS — BP 146/74 | HR 75 | Temp 97.8°F | Ht 64.75 in | Wt 179.0 lb

## 2018-09-13 DIAGNOSIS — E1159 Type 2 diabetes mellitus with other circulatory complications: Secondary | ICD-10-CM

## 2018-09-13 DIAGNOSIS — R109 Unspecified abdominal pain: Secondary | ICD-10-CM | POA: Diagnosis not present

## 2018-09-13 DIAGNOSIS — I2510 Atherosclerotic heart disease of native coronary artery without angina pectoris: Secondary | ICD-10-CM

## 2018-09-13 DIAGNOSIS — R7989 Other specified abnormal findings of blood chemistry: Secondary | ICD-10-CM

## 2018-09-13 DIAGNOSIS — I10 Essential (primary) hypertension: Secondary | ICD-10-CM

## 2018-09-13 DIAGNOSIS — R945 Abnormal results of liver function studies: Secondary | ICD-10-CM

## 2018-09-13 DIAGNOSIS — M21372 Foot drop, left foot: Secondary | ICD-10-CM

## 2018-09-13 NOTE — Patient Instructions (Signed)
Complete xray(s) prior to leaving today. I will notify you of your results once received.  Consider following up with the orthopedist if your symptoms persist.   It was a pleasure to meet you today! Please don't hesitate to call or message me with any questions. Welcome to [facility]!

## 2018-09-13 NOTE — Assessment & Plan Note (Signed)
Managed to the [facility]. A1c from January 2020 stable.  Continue current regimen.

## 2018-09-13 NOTE — Assessment & Plan Note (Signed)
Improved after physical therapy. No foot drop noted today.

## 2018-09-13 NOTE — Assessment & Plan Note (Addendum)
History of stent placement in 2018. Following with cardiology. Continue beta-blocker, statin, clopidogrel, blood pressure control.

## 2018-09-13 NOTE — Assessment & Plan Note (Signed)
Followed by the [facility]. Suspected to be secondary to amiodarone for which she no longer takes.

## 2018-09-13 NOTE — Assessment & Plan Note (Signed)
Stable in the office today given age. Continue current regimen.

## 2018-09-13 NOTE — Progress Notes (Signed)
Subjective:    Patient ID: Alexander Donaldson, male    DOB: 10-Jul-1934, 85 y.o.   MRN: [id number]  HPI  Alexander Donaldson is an 85 year old male who presents today to establish care and discuss the problems mentioned below. Will obtain old records. He is mostly managed through the [facility].   1) CAD/Hypertension: Currently managed on amlodipine 10 mg, Losartan 100 mg, metoprolol succinate 50 mg. History of cardiac stent in July 2018, also managed on clopidogrel and atorvastatin. No recent lipid panel on file. He is currently following with cardiology (Dr. Sharyn Lull), follows 2-3 times annually.   BP Readings from Last 3 Encounters:  09/13/18 (!) 146/74  02/15/18 (!) 141/69  01/20/18 (!) 167/75    2) Type 2 Diabetes: Currently managed on glipizide 10 mg BID. Last A1C was checked in January 2020 per the [place] and was 7.4.  3) CHF: Admitted in August 2019 and treated for acute encephalopathy secondary to CAP. Once on amiodarone and furosemide according to hospital notes from August 2019. Patient denies this diagnosis. No longer on furosemide or amiodarone.   4) Elevated LFT's: Noted during hospital admission in August 2019. AST to low 80's, ALT of low 100's. Underwent further evaluation of liver which was without acute findings. Cholelithiasis was noted, also 4.0 cm renal cyst. Statin dose was reduced during hospital stay in August 2019. No recent LFT's on file. Was also on amiodarone at the time which was removed at that time. Labs are done now through the [facility].  5) Side Pain: Symptoms include left lower/side abdominal pain with "swelling", also gas buildup. His wife has a picture of this bulge today which is prominent to the left lower abdomen. He was evaluated at the ED through the [place], underwent CT abdomen/pelvis which showed with non obstructing left nephrolithiasis, measuring up to 13 mm. CT otherwise without explanation for bulging to abdomen or side pain.   He has a history of fall onto the left  side in July 2019, underwent x-ray at that time. He can never touch and reproduce the pain, the pain is internal. Xray from July 2019 with questionable minimal fracture at the tip of the 11th rib. History of lumbar stenosis and was seeing orthopedics in late 2019. The plan was to do steroid injection to the lumbar spine for which he declined.  His pain is noticeable mostly to the lower left lateral side, lower ribs. Takes him a while to "straigthen out" when standing from a seated position.  He denies numbness/tinling, radiculopathy, hip pain to left side. He did have some problems with foot drop to left foot, this has significantly improved with PT through the [place].   Review of Systems  Respiratory: Negative for shortness of breath.   Cardiovascular: Negative for chest pain.  Musculoskeletal: Positive for arthralgias. Negative for back pain.       Left lateral side pain  Neurological: Negative for weakness and numbness.       Past Medical History:  Diagnosis Date  . Coronary artery disease   . Diabetes mellitus   . Diverticulitis   . Hypertension      Social History   Socioeconomic History  . Marital status: Married    Spouse name: Not on file  . Number of children: Not on file  . Years of education: Not on file  . Highest education level: Not on file  Occupational History  . Not on file  Social Needs  . Financial resource strain:  Not on file  . Food insecurity:    Worry: Not on file    Inability: Not on file  . Transportation needs:    Medical: Not on file    Non-medical: Not on file  Tobacco Use  . Smoking status: Never Smoker  . Smokeless tobacco: Never Used  Substance and Sexual Activity  . Alcohol use: No    Comment: social  . Drug use: No  . Sexual activity: Yes    Birth control/protection: None  Lifestyle  . Physical activity:    Days per week: Not on file    Minutes per session: Not on file  . Stress: Not on file  Relationships  . Social connections:     Talks on phone: Not on file    Gets together: Not on file    Attends religious service: Not on file    Active member of club or organization: Not on file    Attends meetings of clubs or organizations: Not on file    Relationship status: Not on file  . Intimate partner violence:    Fear of current or ex partner: Not on file    Emotionally abused: Not on file    Physically abused: Not on file    Forced sexual activity: Not on file  Other Topics Concern  . Not on file  Social History Narrative  . Not on file    Past Surgical History:  Procedure Laterality Date  . ANGIOPLASTY    . CORONARY STENT INTERVENTION N/A 02/02/2017   Procedure: Coronary Stent Intervention;  Surgeon: Rinaldo Cloud, MD;  Location: [facility] INVASIVE CV LAB;  Service: Cardiovascular;  Laterality: N/A;  . CORONARY STENTS    . LEFT HEART CATH AND CORONARY ANGIOGRAPHY N/A 02/02/2017   Procedure: Left Heart Cath and Coronary Angiography;  Surgeon: Rinaldo Cloud, MD;  Location: [facility] INVASIVE CV LAB;  Service: Cardiovascular;  Laterality: N/A;  . LEFT HEART CATHETERIZATION WITH CORONARY ANGIOGRAM N/A 03/16/2012   Procedure: LEFT HEART CATHETERIZATION WITH CORONARY ANGIOGRAM;  Surgeon: Robynn Pane, MD;  Location: [facility] CATH LAB;  Service: Cardiovascular;  Laterality: N/A;  . PERCUTANEOUS CORONARY STENT INTERVENTION (PCI-S)  03/16/2012   Procedure: PERCUTANEOUS CORONARY STENT INTERVENTION (PCI-S);  Surgeon: Robynn Pane, MD;  Location: [facility] CATH LAB;  Service: Cardiovascular;;  . TONSILLECTOMY      No family history on file.  No Known Allergies  Current Outpatient Medications on File Prior to Visit  Medication Sig Dispense Refill  . amLODipine (NORVASC) 10 MG tablet Take 5 mg by mouth 2 (two) times daily.    Marland Kitchen aspirin 81 MG tablet Take 81 mg by mouth daily.      Marland Kitchen atorvastatin (LIPITOR) 80 MG tablet Take 40 mg by mouth daily.    . clopidogrel (PLAVIX) 75 MG tablet Take 75 mg by mouth daily.  3  . diclofenac sodium (VOLTAREN)  1 % GEL Apply 2 g topically 4 (four) times daily as needed (pain).    Marland Kitchen glipiZIDE (GLUCOTROL) 10 MG tablet Take 10 mg by mouth 2 (two) times daily before a meal. Take 30 minutes before meals    . hydroxypropyl methylcellulose / hypromellose (ISOPTO TEARS / GONIOVISC) 2.5 % ophthalmic solution Place 1 drop into both eyes 4 (four) times daily as needed for dry eyes.    Marland Kitchen losartan (COZAAR) 100 MG tablet Take 50 mg by mouth daily.    . metoprolol succinate (TOPROL-XL) 50 MG 24 hr tablet Take 25 mg by mouth daily. Take  with or immediately following a meal.    . Omega-3 Fatty Acids (FISH OIL) 1000 MG CAPS Take 1,000 mg by mouth 2 (two) times daily.    . psyllium (HYDROCIL/METAMUCIL) 95 % PACK Take 1 packet by mouth 2 (two) times daily.     Marland Kitchen senna-docusate (SENOKOT-S) 8.6-50 MG tablet Take 1 tablet by mouth at bedtime. 30 tablet 0  . nitroGLYCERIN (NITROSTAT) 0.4 MG SL tablet Place 1 tablet (0.4 mg total) under the tongue every 5 (five) minutes x 3 doses as needed for chest pain. 25 tablet 3   No current facility-administered medications on file prior to visit.     BP (!) 146/74   Pulse 75   Temp 97.8 F (36.6 C) (Oral)   Ht 5' 4.75" (1.645 m)   Wt 179 lb (81.2 kg)   SpO2 97%   BMI 30.02 kg/m    Objective:   Physical Exam  Constitutional: He appears well-nourished.  Neck: Neck supple.  Cardiovascular: Normal rate and regular rhythm.  Respiratory: Effort normal and breath sounds normal.  GI: Soft. Bowel sounds are normal. He exhibits no distension. There is no abdominal tenderness.  Musculoskeletal:       Arms:     Comments: Left lateral side nontender upon palpation.  No obvious deformity noted.  Skin: Skin is warm and dry.  Psychiatric: He has a normal mood and affect.           Assessment & Plan:

## 2018-09-13 NOTE — Assessment & Plan Note (Signed)
Chronic since July 2019 after fall. Plain films reviewed via chart with potential 11th rib fracture.  X-ray of left lateral ribs pending. Consider referral based off of x-ray result. Continue extra strength Tylenol as needed.

## 2018-09-14 ENCOUNTER — Telehealth: Payer: Self-pay

## 2018-09-14 NOTE — Telephone Encounter (Signed)
Will review x-rays when able. See result note.

## 2018-09-14 NOTE — Telephone Encounter (Signed)
Alexander Donaldson (DPR signed) left /vm requesting cb about xray results from 09/13/18.Please advise.

## 2018-09-14 NOTE — Telephone Encounter (Signed)
Pt returning your call. Please call pt. °

## 2018-09-17 ENCOUNTER — Other Ambulatory Visit: Payer: Self-pay | Admitting: Primary Care

## 2018-09-17 DIAGNOSIS — R1032 Left lower quadrant pain: Secondary | ICD-10-CM

## 2018-09-17 DIAGNOSIS — R109 Unspecified abdominal pain: Secondary | ICD-10-CM

## 2018-09-17 NOTE — Telephone Encounter (Signed)
Spoken to patient's wife in result note

## 2018-09-17 NOTE — Telephone Encounter (Signed)
Message left for patient to return my call.  

## 2018-09-20 ENCOUNTER — Other Ambulatory Visit: Payer: Self-pay

## 2018-09-20 ENCOUNTER — Ambulatory Visit
Admission: RE | Admit: 2018-09-20 | Discharge: 2018-09-20 | Disposition: A | Discharge status: Home / Self Care | Payer: Medicare Other | Source: Ambulatory Visit | Attending: Primary Care | Admitting: Primary Care

## 2018-09-20 ENCOUNTER — Other Ambulatory Visit: Payer: Self-pay | Admitting: Primary Care

## 2018-09-20 DIAGNOSIS — R1032 Left lower quadrant pain: Secondary | ICD-10-CM | POA: Insufficient documentation

## 2018-09-20 DIAGNOSIS — R109 Unspecified abdominal pain: Secondary | ICD-10-CM | POA: Insufficient documentation

## 2018-09-21 ENCOUNTER — Other Ambulatory Visit: Payer: Self-pay

## 2018-09-21 ENCOUNTER — Other Ambulatory Visit (INDEPENDENT_AMBULATORY_CARE_PROVIDER_SITE_OTHER): Payer: Medicare Other

## 2018-09-21 DIAGNOSIS — R109 Unspecified abdominal pain: Secondary | ICD-10-CM

## 2018-09-21 LAB — POC URINALSYSI DIPSTICK (AUTOMATED)
Bilirubin, UA: NEGATIVE
Blood, UA: NEGATIVE
Glucose, UA: NEGATIVE
KETONES UA: NEGATIVE
Leukocytes, UA: NEGATIVE
Nitrite, UA: NEGATIVE
Protein, UA: NEGATIVE
SPEC GRAV UA: 1.015 (ref 1.010–1.025)
Urobilinogen, UA: 0.2 E.U./dL
pH, UA: 6 (ref 5.0–8.0)

## 2018-09-24 ENCOUNTER — Other Ambulatory Visit: Payer: Self-pay | Admitting: Primary Care

## 2018-09-24 DIAGNOSIS — N200 Calculus of kidney: Secondary | ICD-10-CM

## 2018-09-25 ENCOUNTER — Ambulatory Visit: Payer: Self-pay | Admitting: Urology

## 2018-10-04 ENCOUNTER — Ambulatory Visit: Payer: Medicare Other | Admitting: Urology

## 2018-11-01 ENCOUNTER — Emergency Department (HOSPITAL_COMMUNITY): Payer: Medicare Other

## 2018-11-01 ENCOUNTER — Ambulatory Visit (INDEPENDENT_AMBULATORY_CARE_PROVIDER_SITE_OTHER): Payer: Medicare Other | Admitting: Primary Care

## 2018-11-01 ENCOUNTER — Emergency Department (HOSPITAL_COMMUNITY)
Admission: EM | Admit: 2018-11-01 | Discharge: 2018-11-01 | Disposition: A | Discharge status: Home / Self Care | Payer: Medicare Other | Attending: Emergency Medicine | Admitting: Emergency Medicine

## 2018-11-01 ENCOUNTER — Other Ambulatory Visit: Payer: Self-pay

## 2018-11-01 DIAGNOSIS — M25552 Pain in left hip: Secondary | ICD-10-CM | POA: Insufficient documentation

## 2018-11-01 DIAGNOSIS — Z7901 Long term (current) use of anticoagulants: Secondary | ICD-10-CM | POA: Diagnosis not present

## 2018-11-01 DIAGNOSIS — I10 Essential (primary) hypertension: Secondary | ICD-10-CM | POA: Insufficient documentation

## 2018-11-01 DIAGNOSIS — R109 Unspecified abdominal pain: Secondary | ICD-10-CM

## 2018-11-01 DIAGNOSIS — Y9389 Activity, other specified: Secondary | ICD-10-CM | POA: Diagnosis not present

## 2018-11-01 DIAGNOSIS — Z7984 Long term (current) use of oral hypoglycemic drugs: Secondary | ICD-10-CM | POA: Insufficient documentation

## 2018-11-01 DIAGNOSIS — I2510 Atherosclerotic heart disease of native coronary artery without angina pectoris: Secondary | ICD-10-CM | POA: Insufficient documentation

## 2018-11-01 DIAGNOSIS — Y999 Unspecified external cause status: Secondary | ICD-10-CM | POA: Diagnosis not present

## 2018-11-01 DIAGNOSIS — Y929 Unspecified place or not applicable: Secondary | ICD-10-CM | POA: Diagnosis not present

## 2018-11-01 DIAGNOSIS — Z7982 Long term (current) use of aspirin: Secondary | ICD-10-CM | POA: Diagnosis not present

## 2018-11-01 DIAGNOSIS — Z79899 Other long term (current) drug therapy: Secondary | ICD-10-CM | POA: Diagnosis not present

## 2018-11-01 DIAGNOSIS — W109XXA Fall (on) (from) unspecified stairs and steps, initial encounter: Secondary | ICD-10-CM | POA: Insufficient documentation

## 2018-11-01 DIAGNOSIS — E119 Type 2 diabetes mellitus without complications: Secondary | ICD-10-CM | POA: Diagnosis not present

## 2018-11-01 MED ORDER — GABAPENTIN 100 MG PO CAPS: 100.0000 mg | ORAL_CAPSULE | Freq: Three times a day (TID) | ORAL | 0 refills | Status: DC

## 2018-11-01 MED ORDER — ACETAMINOPHEN 500 MG PO TABS
1000.0000 mg | ORAL_TABLET | Freq: Once | ORAL | Status: AC
  Administered 2018-11-01: 16:00:00 1000 mg via ORAL
  Filled 2018-11-01: qty 2

## 2018-11-01 MED ORDER — DICLOFENAC SODIUM 1 % TD GEL: 2.0000 g | Freq: Four times a day (QID) | TRANSDERMAL | 0 refills | Status: DC | PRN

## 2018-11-01 NOTE — ED Triage Notes (Addendum)
Per EMS , EMS called to home d/t pt fell at home . No LOC, Pt ambulatory since.  Pt wife Clydie Braun [phone] contact info given by EMS. On assessment Pt c/o pain 8/10 left upper leg/groin pain.

## 2018-11-01 NOTE — Discharge Instructions (Signed)
Take tylenol 1000mg (2 extra strength) four times a day. Use the gel as prescribed.  Call you family doctor and let them know that you fell again.  See when they want to see you in the office or if they want to send you to physical therapy or to see a specialist.

## 2018-11-01 NOTE — ED Provider Notes (Signed)
[facility]-EMERGENCY DEPT Provider Note   CSN: [id number] Arrival date & time: 11/01/18  1452    History   Chief Complaint Chief Complaint  Patient presents with  . Fall    HPI Alexander Donaldson is a 85 y.o. male.     85 yo M with a chief complaint of left side pain.  This been going on for the past couple months after he had a fall.  Patient states that he has been seen multiple times for this including a CT scan without etiology.  He just saw his family doctor today in the office and on his way home he was walking up the stairs and lost his balance and fell.  Had some worsening pain to his left groin.  Was able to stand up and walk a short distance.  Denies head injury denies chest pain or shortness of breath denies abdominal pain.  The history is provided by the patient.  Fall  This is a new problem. The current episode started yesterday. The problem occurs constantly. The problem has not changed since onset.Pertinent negatives include no chest pain, no abdominal pain, no headaches and no shortness of breath. The symptoms are aggravated by bending and twisting. Nothing relieves the symptoms. He has tried nothing for the symptoms. The treatment provided no relief.    Past Medical History:  Diagnosis Date  . Acute encephalopathy 02/12/2018  . CAP (community acquired pneumonia) 02/11/2018  . Coronary artery disease   . Diabetes mellitus   . Diverticulitis   . Hypertension   . Hypokalemia 02/12/2018    Patient Active Problem List   Diagnosis Date Noted  . Side pain 09/13/2018  . CAD (coronary artery disease) 02/12/2018  . Left foot drop 02/12/2018  . Type 2 diabetes mellitus with vascular disease (HCC) 02/12/2018  . Essential hypertension 02/12/2018  . Elevated LFTs 02/12/2018  . Nausea & vomiting 02/12/2018    Past Surgical History:  Procedure Laterality Date  . ANGIOPLASTY    . CORONARY STENT INTERVENTION N/A 02/02/2017   Procedure: Coronary Stent  Intervention;  Surgeon: Rinaldo Cloud, MD;  Location: [facility] INVASIVE CV LAB;  Service: Cardiovascular;  Laterality: N/A;  . CORONARY STENTS    . LEFT HEART CATH AND CORONARY ANGIOGRAPHY N/A 02/02/2017   Procedure: Left Heart Cath and Coronary Angiography;  Surgeon: Rinaldo Cloud, MD;  Location: [facility] INVASIVE CV LAB;  Service: Cardiovascular;  Laterality: N/A;  . LEFT HEART CATHETERIZATION WITH CORONARY ANGIOGRAM N/A 03/16/2012   Procedure: LEFT HEART CATHETERIZATION WITH CORONARY ANGIOGRAM;  Surgeon: Robynn Pane, MD;  Location: [facility] CATH LAB;  Service: Cardiovascular;  Laterality: N/A;  . PERCUTANEOUS CORONARY STENT INTERVENTION (PCI-S)  03/16/2012   Procedure: PERCUTANEOUS CORONARY STENT INTERVENTION (PCI-S);  Surgeon: Robynn Pane, MD;  Location: [facility] CATH LAB;  Service: Cardiovascular;;  . TONSILLECTOMY          Home Medications    Prior to Admission medications   Medication Sig Start Date End Date Taking? Authorizing Provider  amLODipine (NORVASC) 10 MG tablet Take 5 mg by mouth 2 (two) times daily.    [provider]  aspirin 81 MG tablet Take 81 mg by mouth daily.      [provider]  atorvastatin (LIPITOR) 80 MG tablet Take 40 mg by mouth daily.    [provider]  clopidogrel (PLAVIX) 75 MG tablet Take 75 mg by mouth daily. 01/02/17   [provider]  diclofenac sodium (VOLTAREN) 1 % GEL Apply  2 g topically 4 (four) times daily as needed (pain). 11/01/18   Melene Plan, DO  gabapentin (NEURONTIN) 100 MG capsule Take 1 capsule (100 mg total) by mouth 3 (three) times daily. 11/01/18   Doreene Nest, NP  glipiZIDE (GLUCOTROL) 10 MG tablet Take 10 mg by mouth 2 (two) times daily before a meal. Take 30 minutes before meals    [provider]  hydroxypropyl methylcellulose / hypromellose (ISOPTO TEARS / GONIOVISC) 2.5 % ophthalmic solution Place 1 drop into both eyes 4 (four) times daily as needed for dry eyes.    [provider]  losartan  (COZAAR) 100 MG tablet Take 50 mg by mouth daily.    [provider]  metoprolol succinate (TOPROL-XL) 50 MG 24 hr tablet Take 25 mg by mouth daily. Take with or immediately following a meal.    [provider]  nitroGLYCERIN (NITROSTAT) 0.4 MG SL tablet Place 1 tablet (0.4 mg total) under the tongue every 5 (five) minutes x 3 doses as needed for chest pain. 03/18/12 02/11/18  Rinaldo Cloud, MD  Omega-3 Fatty Acids (FISH OIL) 1000 MG CAPS Take 1,000 mg by mouth 2 (two) times daily.    [provider]  psyllium (HYDROCIL/METAMUCIL) 95 % PACK Take 1 packet by mouth 2 (two) times daily.     [provider]  senna-docusate (SENOKOT-S) 8.6-50 MG tablet Take 1 tablet by mouth at bedtime. 02/15/18   Albertine Grates, MD    Family History No family history on file.  Social History Social History   Tobacco Use  . Smoking status: Never Smoker  . Smokeless tobacco: Never Used  Substance Use Topics  . Alcohol use: No    Comment: social  . Drug use: No     Allergies   Patient has no known allergies.   Review of Systems Review of Systems  Constitutional: Negative for chills and fever.  HENT: Negative for congestion and facial swelling.   Eyes: Negative for discharge and visual disturbance.  Respiratory: Negative for shortness of breath.   Cardiovascular: Negative for chest pain and palpitations.  Gastrointestinal: Negative for abdominal pain, diarrhea and vomiting.  Musculoskeletal: Positive for arthralgias and myalgias.  Skin: Negative for color change and rash.  Neurological: Negative for tremors, syncope and headaches.  Psychiatric/Behavioral: Negative for confusion and dysphoric mood.     Physical Exam Updated Vital Signs BP (!) 164/83 (BP Location: Left Arm)   Pulse 88   Temp 97.9 F (36.6 C) (Oral)   Resp 16   Wt 81.6 kg   SpO2 98%   BMI 30.19 kg/m   Physical Exam Vitals signs and nursing note reviewed.  Constitutional:      Appearance: He is  well-developed.  HENT:     Head: Normocephalic and atraumatic.  Eyes:     Pupils: Pupils are equal, round, and reactive to light.  Neck:     Musculoskeletal: Normal range of motion and neck supple.     Vascular: No JVD.  Cardiovascular:     Rate and Rhythm: Normal rate and regular rhythm.     Heart sounds: No murmur. No friction rub. No gallop.   Pulmonary:     Effort: No respiratory distress.     Breath sounds: No wheezing.  Abdominal:     General: There is no distension.     Tenderness: There is no abdominal tenderness. There is no guarding or rebound.  Musculoskeletal: Normal range of motion.  General: No tenderness.     Comments: Points to his left groin area.  No pain with internal or external rotation of the left lower extremity pulse motor and sensation are intact.  He has no midline spinal tenderness.  No appreciable abdominal pain.  Skin:    Coloration: Skin is not pale.     Findings: No rash.  Neurological:     Mental Status: He is alert and oriented to person, place, and time.  Psychiatric:        Behavior: Behavior normal.      ED Treatments / Results  Labs (all labs ordered are listed, but only abnormal results are displayed) Labs Reviewed - No data to display  EKG None  Radiology Dg Hip Unilat W Or Wo Pelvis 2-3 Views Left  Result Date: 11/01/2018 CLINICAL DATA:  Left hip pain due to a fall today. Initial encounter. EXAM: DG HIP (WITH OR WITHOUT PELVIS) 2-3V LEFT COMPARISON:  None. FINDINGS: They are is no acute bony or joint abnormality. Mild to moderate bilateral osteoarthritis is seen. No worrisome bony lesion. Soft tissues unremarkable. IMPRESSION: No acute finding. Mild to moderate bilateral hip osteoarthritis. Electronically Signed   By: Drusilla Kanner M.D.   On: 11/01/2018 15:45    Procedures Procedures (including critical care time)  Medications Ordered in ED Medications  acetaminophen (TYLENOL) tablet 1,000 mg (1,000 mg Oral Given  11/01/18 1600)     Initial Impression / Assessment and Plan / ED Course  I have reviewed the triage vital signs and the nursing notes.  Pertinent labs & imaging results that were available during my care of the patient were reviewed by me and considered in my medical decision making (see chart for details).        85 yo M with a chief complaint of left groin pain after fall.  Patient has been having issues with his left side since he fell about 2 months ago.  Has been able to ambulate on this.  Will obtain a plain film to evaluate for fracture.  He had a recent CT scan done about a month ago that was negative.  Plain film viewed by me without fracture.  Patient does have significant arthritis.  He is able to bear weight and take a few steps in the ED.  We will have him follow-up with his family doctor.  4:01 PM:  I have discussed the diagnosis/risks/treatment options with the patient and believe the pt to be eligible for discharge home to follow-up with PCP. We also discussed returning to the ED immediately if new or worsening sx occur. We discussed the sx which are most concerning (e.g., sudden worsening pain, fever, inability to tolerate by mouth) that necessitate immediate return. Medications administered to the patient during their visit and any new prescriptions provided to the patient are listed below.  Medications given during this visit Medications  acetaminophen (TYLENOL) tablet 1,000 mg (1,000 mg Oral Given 11/01/18 1600)     The patient appears reasonably screen and/or stabilized for discharge and I doubt any other medical condition or other [facility] requiring further screening, evaluation, or treatment in the ED at this time prior to discharge.    Final Clinical Impressions(s) / ED Diagnoses   Final diagnoses:  Left hip pain    ED Discharge Orders         Ordered    diclofenac sodium (VOLTAREN) 1 % GEL  4 times daily PRN     11/01/18 1558  Melene PlanFloyd, Cordelro Gautreau,  DO 11/01/18 1601

## 2018-11-01 NOTE — ED Notes (Signed)
Pt d/c home per MD order. Discharge summary reviewed, pt verbalizes understanding. Pt wife to ED for d/c ride home. Off unit via WC.

## 2018-11-01 NOTE — ED Notes (Signed)
Bed: CB44 Expected date:  Expected time:  Means of arrival:  Comments: EMS fall, leg pain

## 2018-11-01 NOTE — Progress Notes (Signed)
Subjective:    Patient ID: Alexander Donaldson, male    DOB: 29-Nov-1933, 85 y.o.   MRN: [id number]  HPI  Virtual Visit via Video Note  I connected with Alexander Donaldson on 11/01/18 at 10:40 AM EDT by a video enabled telemedicine application and verified that I am speaking with the correct person using two identifiers.   I discussed the limitations of evaluation and management by telemedicine and the availability of in person appointments. The patient expressed understanding and agreed to proceed. He is at home, I am in the office.  We were able to connect via video temporarily but the video ultimately failed. We had to continue our visit via phone.  History of Present Illness:  Alexander Donaldson is an 85 year old male with a history of CAD, Type 2 Diabetes, chronic left side pain who presents today with a chief complaint of side pain.  He was last evaluated for this in early March 2020, symptoms since July 2019 after sustaining a fall. He underwent chest xray in March 2020 was negative for rib fracture but suspicious for left renal stone. He underwent CT renal stone one week later with evidence of calculi in each kidney and prostatic calculi. Also with thickening of the bladder. Urinalysis was negative for infection so he was referred to Urology for further evaluation.  Today he continues to endorse left side pain. He has no pain when resting but will notice discomfort/pain when changing positions from sitting to standing and standing to sitting initially, getting in and out of the car. His pain improves when walking or resting. He describes his pain as sharp/shooting down bilateral lower extremities. He has no pain with palpitation. He endorses some constipation and has been taking Metamucil daily and Milk of Magnesium intermittently.  He has an appointment with the Urologist on May 7th.   He presented to urgent care one week ago, underwent xray at the time and xray was negative for fracture. He was told  that his symptoms were likely muscle spasms/MSK related so he was provided with a prescription for cyclobenzaprine without much improvement except for helping him to sleep. He's been taking Tylenol with some improvement. He denies loss of bowel/bladder control, weakness, recent injury/fall.   Observations/Objective:   Appears well, is in no distress. Alert and oriented. Speaking in complete sentences.   Assessment and Plan:  Continued left lateral side pain with recent radiation to bilateral lower extremities. No alarm signs. Could very well have nerve involvement, still could be arthritis vs MSK. Unlikely from renal stones as there is no evidence of ureteral involvement. Follow up with Urology as scheduled.  Stop Flexeril.  Continue Tylenol.  Rx for low dose gabapentin sent to pharmacy to take 1-3 times daily. Drowsiness precautions provided.     Follow Up Instructions:  You may take the gabapentin 100 mg capsules 1-3 times daily for the side pain, caution as this may cause drowsiness.   Stop the Flexeril. You can still take Tylenol if needed.  Follow up with the Urologist as scheduled.  Please update me in 1-2 weeks as discussed.  It was a pleasure to see you today! Mayra Reel, NP-C    I discussed the assessment and treatment plan with the patient. The patient was provided an opportunity to ask questions and all were answered. The patient agreed with the plan and demonstrated an understanding of the instructions.   The patient was advised to call back or seek an in-person evaluation  if the symptoms worsen or if the condition fails to improve as anticipated.     Doreene NestKatherine K Deatra Mcmahen, NP    Review of Systems  Respiratory: Negative for shortness of breath.   Cardiovascular: Negative for chest pain.  Genitourinary:       Denies loss of bowel/bladder control  Musculoskeletal: Positive for arthralgias and myalgias.  Neurological: Negative for weakness and numbness.        Past Medical History:  Diagnosis Date  . Acute encephalopathy 02/12/2018  . CAP (community acquired pneumonia) 02/11/2018  . Coronary artery disease   . Diabetes mellitus   . Diverticulitis   . Hypertension   . Hypokalemia 02/12/2018     Social History   Socioeconomic History  . Marital status: Married    Spouse name: Not on file  . Number of children: Not on file  . Years of education: Not on file  . Highest education level: Not on file  Occupational History  . Not on file  Social Needs  . Financial resource strain: Not on file  . Food insecurity:    Worry: Not on file    Inability: Not on file  . Transportation needs:    Medical: Not on file    Non-medical: Not on file  Tobacco Use  . Smoking status: Never Smoker  . Smokeless tobacco: Never Used  Substance and Sexual Activity  . Alcohol use: No    Comment: social  . Drug use: No  . Sexual activity: Yes    Birth control/protection: None  Lifestyle  . Physical activity:    Days per week: Not on file    Minutes per session: Not on file  . Stress: Not on file  Relationships  . Social connections:    Talks on phone: Not on file    Gets together: Not on file    Attends religious service: Not on file    Active member of club or organization: Not on file    Attends meetings of clubs or organizations: Not on file    Relationship status: Not on file  . Intimate partner violence:    Fear of current or ex partner: Not on file    Emotionally abused: Not on file    Physically abused: Not on file    Forced sexual activity: Not on file  Other Topics Concern  . Not on file  Social History Narrative  . Not on file    Past Surgical History:  Procedure Laterality Date  . ANGIOPLASTY    . CORONARY STENT INTERVENTION N/A 02/02/2017   Procedure: Coronary Stent Intervention;  Surgeon: Rinaldo CloudHarwani, Mohan, MD;  Location: [facility] INVASIVE CV [facility];  Service: Cardiovascular;  Laterality: N/A;  . CORONARY STENTS    . LEFT HEART CATH AND  CORONARY ANGIOGRAPHY N/A 02/02/2017   Procedure: Left Heart Cath and Coronary Angiography;  Surgeon: Rinaldo CloudHarwani, Mohan, MD;  [facility];  Service: Cardiovascular;  Laterality: N/A;  . LEFT HEART CATHETERIZATION WITH CORONARY ANGIOGRAM N/A 03/16/2012   Procedure: LEFT HEART CATHETERIZATION WITH CORONARY ANGIOGRAM;  Surgeon: Robynn PaneMohan N Harwani, MD;  Location: [facility] CATH LAB;  Service: Cardiovascular;  Laterality: N/A;  . PERCUTANEOUS CORONARY STENT INTERVENTION (PCI-S)  03/16/2012   Procedure: PERCUTANEOUS CORONARY STENT INTERVENTION (PCI-S);  Surgeon: Robynn PaneMohan N Harwani, MD;  [facility];  Service: Cardiovascular;;  . TONSILLECTOMY      No family history on file.  No Known Allergies  Current Outpatient Medications on File Prior to Visit  Medication Sig Dispense Refill  . amLODipine (NORVASC) 10 MG tablet Take 5 mg by mouth 2 (two) times daily.    Marland Kitchen aspirin 81 MG tablet Take 81 mg by mouth daily.      Marland Kitchen atorvastatin (LIPITOR) 80 MG tablet Take 40 mg by mouth daily.    . clopidogrel (PLAVIX) 75 MG tablet Take 75 mg by mouth daily.  3  . diclofenac sodium (VOLTAREN) 1 % GEL Apply 2 g topically 4 (four) times daily as needed (pain).    Marland Kitchen glipiZIDE (GLUCOTROL) 10 MG tablet Take 10 mg by mouth 2 (two) times daily before a meal. Take 30 minutes before meals    . hydroxypropyl methylcellulose / hypromellose (ISOPTO TEARS / GONIOVISC) 2.5 % ophthalmic solution Place 1 drop into both eyes 4 (four) times daily as needed for dry eyes.    Marland Kitchen losartan (COZAAR) 100 MG tablet Take 50 mg by mouth daily.    . metoprolol succinate (TOPROL-XL) 50 MG 24 hr tablet Take 25 mg by mouth daily. Take with or immediately following a meal.    . nitroGLYCERIN (NITROSTAT) 0.4 MG SL tablet Place 1 tablet (0.4 mg total) under the tongue every 5 (five) minutes x 3 doses as needed for chest pain. 25 tablet 3  . Omega-3 Fatty Acids (FISH OIL) 1000 MG CAPS Take 1,000 mg by mouth 2 (two) times daily.    . psyllium  (HYDROCIL/METAMUCIL) 95 % PACK Take 1 packet by mouth 2 (two) times daily.     Marland Kitchen senna-docusate (SENOKOT-S) 8.6-50 MG tablet Take 1 tablet by mouth at bedtime. 30 tablet 0   No current facility-administered medications on file prior to visit.     There were no vitals taken for this visit.   Objective:   Physical Exam  Constitutional: He is oriented to person, place, and time. He appears well-nourished.  Respiratory: Effort normal.  Neurological: He is alert and oriented to person, place, and time.  Psychiatric: He has a normal mood and affect.           Assessment & Plan:

## 2018-11-01 NOTE — Assessment & Plan Note (Signed)
Continued left lateral side pain with recent radiation to bilateral lower extremities. No alarm signs. Could very well have nerve involvement, still could be arthritis vs MSK. Unlikely from renal stones as there is no evidence of ureteral involvement. Follow up with Urology as scheduled.  Stop Flexeril.  Continue Tylenol.  Rx for low dose gabapentin sent to pharmacy to take 1-3 times daily. Drowsiness precautions provided.

## 2018-11-01 NOTE — ED Notes (Signed)
ED provider at bedside.

## 2018-11-01 NOTE — Patient Instructions (Signed)
You may take the gabapentin 100 mg capsules 1-3 times daily for the side pain, caution as this may cause drowsiness.   Stop the Flexeril. You can still take Tylenol if needed.  Follow up with the Urologist as scheduled.  Please update me in 1-2 weeks as discussed.  It was a pleasure to see you today! Mayra Reel, NP-C

## 2018-11-15 ENCOUNTER — Ambulatory Visit: Payer: Medicare Other | Admitting: Urology

## 2018-11-24 ENCOUNTER — Other Ambulatory Visit: Payer: Self-pay | Admitting: Primary Care

## 2018-11-24 DIAGNOSIS — R109 Unspecified abdominal pain: Secondary | ICD-10-CM

## 2018-11-26 NOTE — Telephone Encounter (Signed)
Last prescribed on 11/01/2018 . Last office visit on 11/01/2018. No future appointment

## 2018-11-26 NOTE — Telephone Encounter (Signed)
Is he still taking the gabapentin? If so, is it helping and how often is he taking? Does he want a refill?

## 2018-11-27 NOTE — Telephone Encounter (Signed)
Spoken to patient's wife and patient is not taking.

## 2018-12-24 ENCOUNTER — Ambulatory Visit: Payer: Medicare Other | Admitting: Urology

## 2018-12-27 ENCOUNTER — Other Ambulatory Visit: Payer: Self-pay

## 2018-12-27 ENCOUNTER — Encounter: Payer: Self-pay | Admitting: Urology

## 2018-12-27 ENCOUNTER — Ambulatory Visit: Payer: Medicare Other | Admitting: Urology

## 2018-12-27 VITALS — BP 164/74 | HR 96 | Ht 64.0 in | Wt 172.0 lb

## 2018-12-27 DIAGNOSIS — N200 Calculus of kidney: Secondary | ICD-10-CM | POA: Diagnosis not present

## 2018-12-27 NOTE — Progress Notes (Signed)
12/27/2018 4:06 PM   [name] 1933-09-05 [id number]  Referring provider: Pleas Koch, NP [address] [place],  [place] [address]  CC: Left flank pain  HPI: I saw Mr. Ottaviano in urology clinic in consultation for left flank pain from Loma Boston, NP.  He is a co-morbid 85 year old male with extensive cardiac history and diabetes who presents with persistent left-sided flank pain after multiple falls in the last year.  He underwent a CT scan in March 2020 that showed a 1 cm left lower pole nonobstructive renal calculus with no hydronephrosis.  Upon further review of his imaging, this stone is stable since at least 2014 and unchanged in size.  He reports his pain is worse with getting up and physical activity.  He is recently used a lidocaine patch which has improved his pain as well.  He denies any urinary symptoms or gross hematuria.  Severity is mild.   PMH: Past Medical History:  Diagnosis Date  . Acute encephalopathy 02/12/2018  . CAP (community acquired pneumonia) 02/11/2018  . Coronary artery disease   . Diabetes mellitus   . Diverticulitis   . Hypertension   . Hypokalemia 02/12/2018    Surgical History: Past Surgical History:  Procedure Laterality Date  . ANGIOPLASTY    . CORONARY STENT INTERVENTION N/A 02/02/2017   Procedure: Coronary Stent Intervention;  Surgeon: Charolette Forward, MD;  Location: [facility] CV [facility];  Service: Cardiovascular;  Laterality: N/A;  . CORONARY STENTS    . LEFT HEART CATH AND CORONARY ANGIOGRAPHY N/A 02/02/2017   Procedure: Left Heart Cath and Coronary Angiography;  Surgeon: Charolette Forward, MD;  Location: [facility];  Service: Cardiovascular;  Laterality: N/A;  . LEFT HEART CATHETERIZATION WITH CORONARY ANGIOGRAM N/A 03/16/2012   Procedure: LEFT HEART CATHETERIZATION WITH CORONARY ANGIOGRAM;  Surgeon: Clent Demark, MD;  Location: [facility] CATH LAB;  Service: Cardiovascular;  Laterality: N/A;  . PERCUTANEOUS CORONARY STENT  INTERVENTION (PCI-S)  03/16/2012   Procedure: PERCUTANEOUS CORONARY STENT INTERVENTION (PCI-S);  Surgeon: Clent Demark, MD;  Location: [facility] C[facility] LAB;  Service: Cardiovascular;;  . TONSILLECTOMY      Allergies: No Known Allergies  Family History: No family history on file.  Social History:  reports that he has never smoked. He has never used smokeless tobacco. He reports that he does not drink alcohol or use drugs.  ROS: Please see flowsheet from today's date for complete review of systems.  Physical Exam: BP (!) 164/74 (BP Location: Left Arm, Patient Position: Sitting)   Pulse 96   Ht 5\' 4"  (1.626 m)   Wt 172 lb (78 kg)   BMI 29.52 kg/m    Constitutional:  Alert and oriented, No acute distress. Cardiovascular: No clubbing, cyanosis, or edema. Respiratory: Normal respiratory effort, no increased work of breathing. GI: Abdomen is soft, nontender, nondistended, no abdominal masses GU: Lidocaine patch in place on left flank Lymph: No cervical or inguinal lymphadenopathy. Skin: No rashes, bruises or suspicious lesions. Neurologic: Grossly intact, no focal deficits, moving all 4 extremities. Psychiatric: Normal mood and affect.  Laboratory Data: Reviewed  Pertinent Imaging: I have personally reviewed the imaging.  CT from 09/20/2018 shows a stable, nonobstructing, 1 cm left lower pole stone with no hydronephrosis.  This is stable from KUB in 2014.  Stone measures 1500HU.   Assessment & Plan:   In summary, the patient is a comorbid 85 year old male with over 1 year of left-sided flank pain with physical activity after multiple  falls-he did not have any left-sided pain prior to his falls.  He has a non-obstructive 1 cm left lower pole renal stone that is unchanged since 2014.  We discussed at length that this is extremely unlikely to be the etiology of his pain.  The stone is too hard for shockwave lithotripsy, and would require ureteroscopy, laser lithotripsy, and stent if he  ultimately desired treatment of this.  Both the patient and his wife are in agreement for ongoing surveillance with repeat KUB in 1 year.  If he develops symptoms of renal colic with worsening pain, I recommended obtaining a KUB.  RTC 1 year for KUB  Legrand RamsBrian Sninsky, MD 12/27/2018  [facility] [address], [address] [place], [place] [address] [phone]

## 2019-01-03 ENCOUNTER — Ambulatory Visit (INDEPENDENT_AMBULATORY_CARE_PROVIDER_SITE_OTHER): Payer: Medicare Other | Admitting: Family Medicine

## 2019-01-03 ENCOUNTER — Other Ambulatory Visit: Payer: Self-pay

## 2019-01-03 ENCOUNTER — Encounter: Payer: Self-pay | Admitting: Family Medicine

## 2019-01-03 VITALS — BP 148/70 | HR 76 | Temp 98.6°F | Ht 64.75 in | Wt 172.5 lb

## 2019-01-03 DIAGNOSIS — R935 Abnormal findings on diagnostic imaging of other abdominal regions, including retroperitoneum: Secondary | ICD-10-CM

## 2019-01-03 DIAGNOSIS — R1902 Left upper quadrant abdominal swelling, mass and lump: Secondary | ICD-10-CM | POA: Diagnosis not present

## 2019-01-03 DIAGNOSIS — R109 Unspecified abdominal pain: Secondary | ICD-10-CM

## 2019-01-03 DIAGNOSIS — R0600 Dyspnea, unspecified: Secondary | ICD-10-CM

## 2019-01-03 DIAGNOSIS — R1900 Intra-abdominal and pelvic swelling, mass and lump, unspecified site: Secondary | ICD-10-CM

## 2019-01-03 DIAGNOSIS — R9389 Abnormal findings on diagnostic imaging of other specified body structures: Secondary | ICD-10-CM

## 2019-01-03 LAB — HEPATIC FUNCTION PANEL
ALT: 16 U/L (ref 0–53)
AST: 18 U/L (ref 0–37)
Albumin: 4.5 g/dL (ref 3.5–5.2)
Alkaline Phosphatase: 77 U/L (ref 39–117)
Bilirubin, Direct: 0.2 mg/dL (ref 0.0–0.3)
Total Bilirubin: 1.2 mg/dL (ref 0.2–1.2)
Total Protein: 7 g/dL (ref 6.0–8.3)

## 2019-01-03 LAB — CBC WITH DIFFERENTIAL/PLATELET
Basophils Absolute: 0 10*3/uL (ref 0.0–0.1)
Basophils Relative: 0.6 % (ref 0.0–3.0)
Eosinophils Absolute: 0.2 10*3/uL (ref 0.0–0.7)
Eosinophils Relative: 4.8 % (ref 0.0–5.0)
HCT: 42 % (ref 39.0–52.0)
Hemoglobin: 14.7 g/dL (ref 13.0–17.0)
Lymphocytes Relative: 45.9 % (ref 12.0–46.0)
Lymphs Abs: 2.3 10*3/uL (ref 0.7–4.0)
MCHC: 34.9 g/dL (ref 30.0–36.0)
MCV: 91.1 fl (ref 78.0–100.0)
Monocytes Absolute: 0.4 10*3/uL (ref 0.1–1.0)
Monocytes Relative: 8.8 % (ref 3.0–12.0)
Neutro Abs: 2 10*3/uL (ref 1.4–7.7)
Neutrophils Relative %: 39.9 % — ABNORMAL LOW (ref 43.0–77.0)
Platelets: 199 10*3/uL (ref 150.0–400.0)
RBC: 4.61 Mil/uL (ref 4.22–5.81)
RDW: 13 % (ref 11.5–15.5)
WBC: 5 10*3/uL (ref 4.0–10.5)

## 2019-01-03 LAB — POC URINALSYSI DIPSTICK (AUTOMATED)
Bilirubin, UA: NEGATIVE
Glucose, UA: NEGATIVE
Ketones, UA: NEGATIVE
Leukocytes, UA: NEGATIVE
Nitrite, UA: NEGATIVE
Protein, UA: NEGATIVE
Spec Grav, UA: 1.01 (ref 1.010–1.025)
Urobilinogen, UA: 0.2 E.U./dL
pH, UA: 6 (ref 5.0–8.0)

## 2019-01-03 LAB — BASIC METABOLIC PANEL
BUN: 14 mg/dL (ref 6–23)
CO2: 31 mEq/L (ref 19–32)
Calcium: 9.1 mg/dL (ref 8.4–10.5)
Chloride: 99 mEq/L (ref 96–112)
Creatinine, Ser: 0.82 mg/dL (ref 0.40–1.50)
GFR: 108.56 mL/min (ref >60.00)
Glucose, Bld: 147 mg/dL — ABNORMAL HIGH (ref 70–99)
Potassium: 3.8 mEq/L (ref 3.5–5.1)
Sodium: 137 mEq/L (ref 135–145)

## 2019-01-03 NOTE — Progress Notes (Addendum)
Alexander Mangel [name], MD Primary Care and Sports Medicine [facility] at [facility] [address] [place], [address] Phone: [phone]  FAX: [phone]  [name] - 85 y.o. male  MRN [id number]  Date of Birth: 03-30-1934  Visit Date: 01/03/2019  PCP: Doreene Nest, NP  Referred by: Doreene Nest, NP  Chief Complaint  Patient presents with  . Flank Pain    Left  . Fall   Subjective:   Alexander Donaldson is a 85 y.o. very pleasant male patient who presents with the following:  Last year started again in 01/2018, then fell again in September.    Does not feel and has maybe reinjured it. Larey Seat again.  Larey Seat on his son's deck 2 weeks ago. Fell 3 times.  He is fallen several times in the last year.  He has had abdominal pain on the left side and flank pain since July 2019.  Generally he has not felt well in this region for a year.  He is not able to sleep in bed, and he sleeps in a recliner.  He has noticed some change in his abdomen.  On several lung studies, CT as well as x-ray, there was some concern for possible interstitial lung disease as well as there was some notation of potential mesothelioma.  L sided abdominal mass. F/u ILD lung disease vs mesothelioma  Past Medical History, Surgical History, Social History, Family History, Problem List, Medications, and Allergies have been reviewed and updated if relevant.  Patient Active Problem List   Diagnosis Date Noted  . Side pain 09/13/2018  . CAD (coronary artery disease) 02/12/2018  . Left foot drop 02/12/2018  . Type 2 diabetes mellitus with vascular disease (HCC) 02/12/2018  . Essential hypertension 02/12/2018  . Elevated LFTs 02/12/2018  . Nausea & vomiting 02/12/2018    Past Medical History:  Diagnosis Date  . Acute encephalopathy 02/12/2018  . CAP (community acquired pneumonia) 02/11/2018  . Coronary artery disease   . Diabetes mellitus   . Diverticulitis   . Hypertension    . Hypokalemia 02/12/2018    Past Surgical History:  Procedure Laterality Date  . ANGIOPLASTY    . CORONARY STENT INTERVENTION N/A 02/02/2017   Procedure: Coronary Stent Intervention;  Surgeon: Rinaldo Cloud, MD;  Location: [facility] INVASIVE CV LAB;  Service: Cardiovascular;  Laterality: N/A;  . CORONARY STENTS    . LEFT HEART CATH AND CORONARY ANGIOGRAPHY N/A 02/02/2017   Procedure: Left Heart Cath and Coronary Angiography;  Surgeon: Rinaldo Cloud, MD;  Location: [facility] INVASIVE CV LAB;  Service: Cardiovascular;  Laterality: N/A;  . LEFT HEART CATHETERIZATION WITH CORONARY ANGIOGRAM N/A 03/16/2012   Procedure: LEFT HEART CATHETERIZATION WITH CORONARY ANGIOGRAM;  Surgeon: Robynn Pane, MD;  Location: [facility] CATH LAB;  Service: Cardiovascular;  Laterality: N/A;  . PERCUTANEOUS CORONARY STENT INTERVENTION (PCI-S)  03/16/2012   Procedure: PERCUTANEOUS CORONARY STENT INTERVENTION (PCI-S);  Surgeon: Robynn Pane, MD;  Location: [facility] CATH LAB;  Service: Cardiovascular;;  . TONSILLECTOMY      Social History   Socioeconomic History  . Marital status: Married    Spouse name: Not on file  . Number of children: Not on file  . Years of education: Not on file  . Highest education level: Not on file  Occupational History  . Not on file  Social Needs  . Financial resource strain: Not on file  . Food insecurity    Worry: Not on file  Inability: Not on file  . Transportation needs    Medical: Not on file    Non-medical: Not on file  Tobacco Use  . Smoking status: Never Smoker  . Smokeless tobacco: Never Used  Substance and Sexual Activity  . Alcohol use: No    Comment: social  . Drug use: No  . Sexual activity: Yes    Birth control/protection: None  Lifestyle  . Physical activity    Days per week: Not on file    Minutes per session: Not on file  . Stress: Not on file  Relationships  . Social Musician on phone: Not on file    Gets together: Not on file    Attends religious service:  Not on file    Active member of club or organization: Not on file    Attends meetings of clubs or organizations: Not on file    Relationship status: Not on file  . Intimate partner violence    Fear of current or ex partner: Not on file    Emotionally abused: Not on file    Physically abused: Not on file    Forced sexual activity: Not on file  Other Topics Concern  . Not on file  Social History Narrative  . Not on file    History reviewed. No pertinent family history.  No Known Allergies  Medication list reviewed and updated in full in [place] [facility].   GEN: No acute illnesses, no fevers, chills. GI: No n/v/d, eating normally Pulm: No SOB Interactive and getting along well at home. Otherwise, the pertinent positives and negatives are listed above and in the HPI, otherwise a full review of systems has been reviewed and is negative unless noted positive.   Objective:   BP (!) 148/70   Pulse 76   Temp 98.6 F (37 C) (Oral)   Ht 5' 4.75" (1.645 m)   Wt 172 lb 8 oz (78.2 kg)   SpO2 98%   BMI 28.93 kg/m   GEN: WDWN, NAD, Non-toxic, A & O x 3 HEENT: Atraumatic, Normocephalic. Neck supple. No masses, No LAD. Ears and Nose: No external deformity. CV: RRR, No M/G/R. No JVD. No thrill. No extra heart sounds. PULM: CTA B, no wheezes, crackles, rhonchi. No retractions. No resp. distress. No accessory muscle use. ABD: S, NT, ND, + BS, No rebound, On L upper abdomen there is a palpable fullness. EXTR: No c/c/e NEURO Normal gait.  PSYCH: Normally interactive. Conversant. Not depressed or anxious appearing.  Calm demeanor.   Laboratory and Imaging Data: CLINICAL DATA:  85 year old male with a history of decreased breath sounds  EXAM: CT CHEST WITHOUT CONTRAST  TECHNIQUE: Multidetector CT imaging of the chest was performed following the standard protocol without IV contrast.  COMPARISON:  Plain film 02/13/2018, 02/14/2018  FINDINGS: Cardiovascular: Heart size within  normal limits. Mild pericardial fluid/thickening, most pronounced anteriorly. Calcifications of the aortic valve. Dense calcifications of left main, left anterior descending, circumflex, right coronary arteries.  Calcifications of the aortic arch and descending thoracic aorta. No aneurysm.  Mediastinum/Nodes: Multiple borderline enlarged lymph nodes of the mediastinum, as well as the right hilum. Index nodes in the lowest paratracheal nodal station measures 10 mm.  Unremarkable course of the thoracic esophagus.  Lungs/Pleura: No endotracheal or endobronchial debris. Confluent airspace disease with interlobular septal thickening of the right upper lobe extending towards the periphery. Classic CT air bronchograms. Focal ground-glass and nodular opacity of the left upper lobe, right middle  lobe, and bilateral lower lobes.  Small bilateral pleural effusions.  Upper Abdomen: No acute finding of the upper abdomen.  Incidental partial imaging of low-density lesion of the left kidney, most likely a cyst.  Musculoskeletal: No acute displaced fracture. Advanced disc disease of T12-L1 with subchondral cyst formation/small nodes and vacuum disc phenomenon. No bony canal narrowing of the thoracic spine.  IMPRESSION: Multifocal pneumonia, predominantly of the right upper lobe, with parapneumonic effusions and reactive mediastinal adenopathy.  Left main and 3 vessel coronary artery disease with associated aortic atherosclerosis. Aortic Atherosclerosis (ICD10-I70.0).   Electronically Signed   By: Gilmer MorJaime  Wagner D.O.   On: 02/14/2018 20:41  CLINICAL DATA:  Left flank pain.  Recent fall  EXAM: CT ABDOMEN AND PELVIS WITHOUT CONTRAST  TECHNIQUE: Multidetector CT imaging of the abdomen and pelvis was performed following the standard protocol without oral or IV contrast.  COMPARISON:  None.  FINDINGS: Lower chest: There is no lung base edema or consolidation. There is  calcification along the posterior left pleura, likely due to previous asbestos exposure. There are multiple foci of coronary artery calcification.  Hepatobiliary: Liver appears intact on this noncontrast enhanced study. No focal liver lesions are appreciable. There are multiple gallstones within the gallbladder. Gallbladder wall is not appreciably thickened. There is no appreciable biliary duct dilatation.  Pancreas: No pancreatic mass or inflammatory focus. No peripancreatic fluid evident.  Spleen: The spleen appears intact on this noncontrast enhanced study. No perisplenic fluid. No focal splenic lesions evident.  Adrenals/Urinary Tract: Adrenals bilaterally appear normal. There is a cyst arising from the lateral left kidney measuring 4.6 x 4.2 cm. A cyst in the lower pole of the left kidney measures 1.2 x 1.0 cm. There is no perinephric fluid. No hydronephrosis is appreciable on either side. There is a calculus in the lower pole of the left kidney measuring 1.0 x 1.0 cm. There is a 3 x 2 mm calculus in the posterior mid left kidney. There is a 1 mm calculus in the lower pole the right kidney. There are no ureteral calculi on either side. Urinary bladder is midline with wall thickness diffusely increased.  Stomach/Bowel: There are multiple descending colonic and sigmoid diverticula without diverticulitis. There is no bowel wall or mesenteric thickening. There is no appreciable bowel obstruction. There is no free air or portal venous air.  Vascular/Lymphatic: There is aortoiliac atherosclerosis. Aorta is tortuous, but there is no aneurysm. There is no periaortic fluid. No adenopathy is appreciable in the abdomen or pelvis.  Reproductive: There are prostatic calculi. The prostate and seminal vesicles appear within normal limits for age. No pelvic masses evident. There is a large left-sided scrotal hydrocele, incompletely visualized.  Other: No abnormal fluid  collections in the abdomen or pelvis. There is fat in each inguinal ring. There is no periappendiceal region inflammation. No abscess or ascites is evident in the abdomen or pelvis.  Musculoskeletal: There is extensive arthropathy throughout the lower thoracic and lumbar spine regions. There is no appreciable acute fracture or dislocation. There is no intramuscular or abdominal wall lesion.  IMPRESSION: 1. No traumatic appearing lesion evident on this noncontrast enhanced study.  2. Calculi in each kidney with the largest calculus in the lower pole of the left kidney measuring 1.2 x 1.0 cm. No hydronephrosis on either side. No ureteral calculi. There are several prostatic calculi.  3. Diffuse thickening of the urinary bladder. Suspect a degree of cystitis.  4.  Sizable left scrotal hydrocele, incompletely visualized.  5. Extensive  left-sided colonic diverticulosis without diverticulitis. No bowel obstruction. No abscess in the abdomen or pelvis. No periappendiceal region inflammatory change.  6. Cholelithiasis. No gallbladder wall thickening or biliary duct dilatation evident.  7. There is aortoiliac atherosclerosis. There are foci of coronary artery calcification.   Electronically Signed   By: Bretta BangWilliam  Woodruff III M.D.   On: 09/20/2018 12:37  Ct Abdomen Pelvis W Contrast  Result Date: 01/10/2019 CLINICAL DATA:  Left flank pain. Intra-abdominal and pelvic swelling. Palpable mass in the left upper quadrant. Abnormal chest CT. Abnormal abdominal CT scan. EXAM: CT ABDOMEN AND PELVIS WITH CONTRAST TECHNIQUE: Multidetector CT imaging of the abdomen and pelvis was performed using the standard protocol following bolus administration of intravenous contrast. CONTRAST:  100mL OMNIPAQUE IOHEXOL 300 MG/ML  SOLN COMPARISON:  Comparison CT scan of the abdomen and pelvis dated 09/20/2018 FINDINGS: Lower chest: Stable pleural calcifications at the left lung base posteriorly. No  evidence of a lung mass or pleural effusion. Heart size is normal. Coronary artery calcifications. Hepatobiliary: Liver parenchyma appears normal. Multiple gallstones. 5 mm noncalcified mass in gallbladder which could represent a polyp. No gallbladder wall thickening. No dilated bile ducts. Pancreas: Unremarkable. No pancreatic ductal dilatation or surrounding inflammatory changes. Spleen: Normal in size without focal abnormality. Adrenals/Urinary Tract: Normal adrenal glands. 5.2 cm simple appearing cyst on the upper pole of the left kidney. 10 mm stone in the lower pole of the left kidney. 3 mm stone in the mid left kidney. 9 mm cyst in the lower pole of the left kidney. 11 mm cyst in the mid right kidney. No hydronephrosis.Bladder appears normal. Stomach/Bowel: Diverticulosis of proximal sigmoid portion of the colon and of the descending colon. No diverticulitis. Terminal ileum and appendix are normal. Cecum lies in the right mid abdomen. Stomach and small bowel appear normal. Vascular/Lymphatic: Aortic atherosclerosis. No enlarged abdominal or pelvic lymph nodes. Reproductive: The prostate gland is slightly enlarged, 6.2 cm in transverse dimension. Other: Left hydrocele. No ascites. No abdominal wall hernias. Musculoskeletal: Patient has severe degenerative disc disease throughout the lower thoracic and lumbar spine. There has been marked progression of degenerative disc disease at T11-12 and T12-L1 since 09/20/2018. The patient has developed bony ankylosis at the T12-L1 level since the prior study. There is already bony ankylosis at T10-11. There are increased erosions of endplates at T11-12 asymmetric to the left with some bone fragmentation. There appears to be a disc extrusion into the left side of the spinal canal at T11-12 which compresses the thecal sac. There is slight paraspinal soft tissue stranding at T11-12 although this appears diminished since the prior study. There is a grade 1 spondylolisthesis  at L5-S1 with disc degeneration and severe bilateral facet arthritis at that level. IMPRESSION: 1. Marked progression of degenerative disc disease in the lower thoracic and lumbar spine with a prominent soft disc extrusion into the left side of the spinal canal at T11-12. I think these findings represent aggressive degenerative disc disease rather than infection. Does the patient have any clinical symptoms suggestive of infection? 2. The only mass in the left upper quadrant is a 5 cm cyst on the left ovary. The anterolateral abdominal wall musculature appears more prominent in the left upper quadrant than on the prior study. This could be due to muscle spasm due to the progressive disc degeneration and disc protrusion at T11-12. 3. Left renal calculi. 4. 5 mm polyp in the gallbladder. 5. Diverticulosis of the distal colon. 6. Left hydrocele. 7. Cholelithiasis. Electronically Signed  By: Lorriane Shire M.D.   On: 01/10/2019 16:09    Assessment and Plan:     ICD-10-CM   1. Abdominal mass, left upper quadrant  G99.24 Basic metabolic panel    CBC with Differential/Platelet    Hepatic function panel    CT Abdomen Pelvis W Contrast  2. Left flank pain  R10.9 POCT Urinalysis Dipstick (Automated)    Basic metabolic panel    CBC with Differential/Platelet    Hepatic function panel    CT Abdomen Pelvis W Contrast  3. Abnormal chest CT  Q68.34 Basic metabolic panel    CBC with Differential/Platelet    Hepatic function panel    CT Abdomen Pelvis W Contrast  4. Abnormal abdominal CT scan  H96.2 Basic metabolic panel    CBC with Differential/Platelet    Hepatic function panel    CT Abdomen Pelvis W Contrast  5. Intra-abdominal and pelvic swelling, mass and lump, unspecified site  I29.79 Basic metabolic panel    CBC with Differential/Platelet    Hepatic function panel    CT Abdomen Pelvis W Contrast   Level of Medical Decision-Making in this case is high  Multiple ongoing problems.  Chronic flank  pain.  Chronic abdominal pain.  Mass or alteration of the left-sided upper quadrant.  Obtain a CT of the abdomen with contrast to evaluate for potential neoplasm or other intra-abdominal process.  Obtain additional baseline labs as above.  Given abnormalities noted in prior studies, including groundglass appearance which is typical for interstitial lung disease.  There is also a note for potential changes consistent with asbestos exposure.  Depending on CT abd/pelvis results, a next appropriate diagnostic study to evaluate the lungs would be a high resolution chest CT to evaluate for intersititial lung disease and mesothelioma.   Addendum, 01/17/2019: Patient's CT abdomen and pelvis has returned with above findings.  Pain continues, but there remains concerns raised regarding ground glass appearance on prior Chest x-ray, which is typical in interstitial lung disease.  Obtain a high resolution chest CT to evaluate for interstitial lung disease and mesothelioma.  Electronically Signed  By: Owens Loffler, MD On: 01/17/2019 7:38 AM  Follow-up: depending on studies  No orders of the defined types were placed in this encounter.  Orders Placed This Encounter  Procedures  . CT Abdomen Pelvis W Contrast  . Basic metabolic panel  . CBC with Differential/Platelet  . Hepatic function panel  . POCT Urinalysis Dipstick (Automated)    Signed,  Nadean Montanaro T. Ikea Demicco, MD   Outpatient Encounter Medications as of 01/03/2019  Medication Sig  . amLODipine (NORVASC) 10 MG tablet Take 5 mg by mouth 2 (two) times daily.  Marland Kitchen aspirin 81 MG tablet Take 81 mg by mouth daily.    Marland Kitchen atorvastatin (LIPITOR) 80 MG tablet Take 40 mg by mouth daily.  . clopidogrel (PLAVIX) 75 MG tablet Take 75 mg by mouth daily.  . diclofenac sodium (VOLTAREN) 1 % GEL Apply 2 g topically 4 (four) times daily as needed (pain).  Marland Kitchen glipiZIDE (GLUCOTROL) 10 MG tablet Take 10 mg by mouth 2 (two) times daily before a meal. Take 30 minutes before  meals  . losartan (COZAAR) 100 MG tablet Take 50 mg by mouth daily.  . metoprolol succinate (TOPROL-XL) 50 MG 24 hr tablet Take 25 mg by mouth daily. Take with or immediately following a meal.  . Omega-3 Fatty Acids (FISH OIL) 1000 MG CAPS Take 1,000 mg by mouth 2 (two) times daily.  . psyllium (  HYDROCIL/METAMUCIL) 95 % PACK Take 1 packet by mouth 2 (two) times daily.   Marland Kitchen. senna-docusate (SENOKOT-S) 8.6-50 MG tablet Take 1 tablet by mouth at bedtime.  . nitroGLYCERIN (NITROSTAT) 0.4 MG SL tablet Place 1 tablet (0.4 mg total) under the tongue every 5 (five) minutes x 3 doses as needed for chest pain.  . [DISCONTINUED] cyclobenzaprine (FLEXERIL) 5 MG tablet TAKE 1 TABLET BY MOUTH EVERY 8 HOURS AS NEEDED FOR MUSCLE SPASM OR PAIN  . [DISCONTINUED] gabapentin (NEURONTIN) 100 MG capsule Take 1 capsule (100 mg total) by mouth 3 (three) times daily. (Patient not taking: Reported on 12/27/2018)  . [DISCONTINUED] hydroxypropyl methylcellulose / hypromellose (ISOPTO TEARS / GONIOVISC) 2.5 % ophthalmic solution Place 1 drop into both eyes 4 (four) times daily as needed for dry eyes.   No facility-administered encounter medications on file as of 01/03/2019.

## 2019-01-10 ENCOUNTER — Other Ambulatory Visit: Payer: Self-pay

## 2019-01-10 ENCOUNTER — Ambulatory Visit
Admission: RE | Admit: 2019-01-10 | Discharge: 2019-01-10 | Disposition: A | Discharge status: Home / Self Care | Payer: Medicare Other | Source: Ambulatory Visit | Attending: Family Medicine | Admitting: Family Medicine

## 2019-01-10 DIAGNOSIS — R1900 Intra-abdominal and pelvic swelling, mass and lump, unspecified site: Secondary | ICD-10-CM | POA: Insufficient documentation

## 2019-01-10 DIAGNOSIS — R9389 Abnormal findings on diagnostic imaging of other specified body structures: Secondary | ICD-10-CM | POA: Diagnosis present

## 2019-01-10 DIAGNOSIS — R935 Abnormal findings on diagnostic imaging of other abdominal regions, including retroperitoneum: Secondary | ICD-10-CM | POA: Diagnosis present

## 2019-01-10 DIAGNOSIS — R1902 Left upper quadrant abdominal swelling, mass and lump: Secondary | ICD-10-CM

## 2019-01-10 DIAGNOSIS — R109 Unspecified abdominal pain: Secondary | ICD-10-CM | POA: Diagnosis present

## 2019-01-10 MED ORDER — IOHEXOL 300 MG/ML  SOLN
100.0000 mL | Freq: Once | INTRAMUSCULAR | Status: AC | PRN
  Administered 2019-01-10: 100 mL via INTRAVENOUS

## 2019-01-16 ENCOUNTER — Telehealth: Payer: Self-pay | Admitting: *Deleted

## 2019-01-16 NOTE — Telephone Encounter (Signed)
Patient's wife left a voicemail stating that he had a CT scan done Thursday and they would like a call back with the results.

## 2019-01-16 NOTE — Telephone Encounter (Signed)
noted 

## 2019-01-17 ENCOUNTER — Telehealth: Payer: Self-pay

## 2019-01-17 NOTE — Addendum Note (Signed)
Addended by: Owens Loffler on: 01/17/2019 07:40 AM   Modules accepted: Orders

## 2019-01-17 NOTE — Telephone Encounter (Signed)
Alexander Donaldson (DPR signed) wanted to go over CT of Abdomen results again. Alexander Donaldson notified as instructed and she voiced understanding. Alexander Donaldson was also to call Alexander Donaldson back and asked to speak with her. Alexander Donaldson [employer] will speak with Alexander Donaldson. Nothing further needed.

## 2019-01-18 ENCOUNTER — Telehealth: Payer: Self-pay | Admitting: Primary Care

## 2019-01-18 NOTE — Telephone Encounter (Signed)
Patient's wife requested a call back. She stated she would like to speak to K.Clark if at all possible.  She has several questions about the patient's back .   C/B # [phone]

## 2019-01-21 NOTE — Telephone Encounter (Signed)
Spoken to patient's wife. She stated that patient had a phone appointment with Dr Tyrone Sage at the [facility].  She wanted to ask if [name] is willing to order the bone density and thyroid function for patient, also any other lab orders patient amy be due. Patients are not allowed in the [place] building at this time.  Please advise.

## 2019-01-21 NOTE — Telephone Encounter (Signed)
Does he follow with the [place] for his diabetes, high blood pressure, high cholesterol? If they are not testing and treating then we can get him scheduled in our office for follow-up visit and labs.

## 2019-01-21 NOTE — Telephone Encounter (Signed)
Spoken and notified patient's wife of Alexander Donaldson comments. Patient and wife wanted to schedule appointment so they discuss a few things with [name] as well.

## 2019-01-21 NOTE — Telephone Encounter (Signed)
Noted  

## 2019-01-22 ENCOUNTER — Encounter: Payer: Self-pay | Admitting: Primary Care

## 2019-01-22 ENCOUNTER — Ambulatory Visit (INDEPENDENT_AMBULATORY_CARE_PROVIDER_SITE_OTHER): Payer: Medicare Other | Admitting: Primary Care

## 2019-01-22 ENCOUNTER — Other Ambulatory Visit: Payer: Self-pay

## 2019-01-22 VITALS — BP 140/86 | HR 62 | Temp 98.6°F | Ht 64.75 in | Wt 172.5 lb

## 2019-01-22 DIAGNOSIS — I2510 Atherosclerotic heart disease of native coronary artery without angina pectoris: Secondary | ICD-10-CM | POA: Diagnosis not present

## 2019-01-22 DIAGNOSIS — I10 Essential (primary) hypertension: Secondary | ICD-10-CM | POA: Diagnosis not present

## 2019-01-22 DIAGNOSIS — R945 Abnormal results of liver function studies: Secondary | ICD-10-CM

## 2019-01-22 DIAGNOSIS — E1159 Type 2 diabetes mellitus with other circulatory complications: Secondary | ICD-10-CM

## 2019-01-22 DIAGNOSIS — R7989 Other specified abnormal findings of blood chemistry: Secondary | ICD-10-CM

## 2019-01-22 LAB — LIPID PANEL
Cholesterol: 142 mg/dL (ref 0–200)
HDL: 40.8 mg/dL (ref >39.00)
LDL Cholesterol: 79 mg/dL (ref 0–99)
NonHDL: 101.27
Total CHOL/HDL Ratio: 3
Triglycerides: 111 mg/dL (ref 0.0–149.0)
VLDL: 22.2 mg/dL (ref 0.0–40.0)

## 2019-01-22 LAB — TSH: TSH: 1.91 u[IU]/mL (ref 0.35–4.50)

## 2019-01-22 LAB — HEMOGLOBIN A1C: Hgb A1c MFr Bld: 6.8 % — ABNORMAL HIGH (ref 4.6–6.5)

## 2019-01-22 LAB — MICROALBUMIN / CREATININE URINE RATIO
Creatinine,U: 78.9 mg/dL
Microalb Creat Ratio: 3.6 mg/g (ref 0.0–30.0)
Microalb, Ur: 2.8 mg/dL — ABNORMAL HIGH (ref 0.0–1.9)

## 2019-01-22 NOTE — Patient Instructions (Signed)
Stop by the lab prior to leaving today. I will notify you of your results once received.   Completed the CT scan of your chest as scheduled.  It was a pleasure to see you today!

## 2019-01-22 NOTE — Progress Notes (Signed)
Subjective:    Patient ID: Alexander Donaldson, male    DOB: 02/15/1934, 85 y.o. Alexander Donaldson  MRN: [id number]  HPI  Alexander Donaldson is an 85 year old male with a history of CAD, type 2 diabetes, hypertension, chronic side pain who presents today for follow up. He is mostly managed per the [facility] in [place] but they are not seeing patients in the office at this time.   1) Type 2 Diabetes:  Current medications include: Glipizide 10 mg BID.   He is checking his blood glucose 1 times daily and is getting readings of:  AM fasting 120-130  Last A1C: 7.4 per patient in January 2020. Last Eye Exam: Completed in 2019 Pneumonia Vaccination: UTD ACE/ARB: Losartan, [facility] wants urine microalbumin Statin: atorvastatin   2) Essential Hypertension/CAD: Currently managed on amlodipine 10, losartan 100 mg, metoprolol succinate 50 mg. He is checking his BP at home which is running 130-140's/60-70's. Following with cardiology with last visit in May 2020. He denies chest pain, SOB.    BP Readings from Last 3 Encounters:  01/22/19 140/86  01/03/19 (!) 148/70  12/27/18 (!) 164/74    3) Hyperlipidemia/CAD: Currently managed on atorvastatin 80 mg and clopidogrel 75 mg, aspirin 81 mg. He is needing lipid panel done per [facility].   Review of Systems  Eyes: Negative for visual disturbance.  Respiratory: Negative for shortness of breath.   Cardiovascular: Negative for chest pain.  Neurological: Negative for dizziness and headaches.       Past Medical History:  Diagnosis Date  . Acute encephalopathy 02/12/2018  . CAP (community acquired pneumonia) 02/11/2018  . Coronary artery disease   . Diabetes mellitus   . Diverticulitis   . Hypertension   . Hypokalemia 02/12/2018     Social History   Socioeconomic History  . Marital status: Married    Spouse name: Not on file  . Number of children: Not on file  . Years of education: Not on file  . Highest education level: Not on file  Occupational History  . Not on file  Social Needs   . Financial resource strain: Not on file  . Food insecurity    Worry: Not on file    Inability: Not on file  . Transportation needs    Medical: Not on file    Non-medical: Not on file  Tobacco Use  . Smoking status: Never Smoker  . Smokeless tobacco: Never Used  Substance and Sexual Activity  . Alcohol use: No    Comment: social  . Drug use: No  . Sexual activity: Yes    Birth control/protection: None  Lifestyle  . Physical activity    Days per week: Not on file    Minutes per session: Not on file  . Stress: Not on file  Relationships  . Social Musicianconnections    Talks on phone: Not on file    Gets together: Not on file    Attends religious service: Not on file    Active member of club or organization: Not on file    Attends meetings of clubs or organizations: Not on file    Relationship status: Not on file  . Intimate partner violence    Fear of current or ex partner: Not on file    Emotionally abused: Not on file    Physically abused: Not on file    Forced sexual activity: Not on file  Other Topics Concern  . Not on file  Social History Narrative  . Not  on file    Past Surgical History:  Procedure Laterality Date  . ANGIOPLASTY    . CORONARY STENT INTERVENTION N/A 02/02/2017   Procedure: Coronary Stent Intervention;  Surgeon: Charolette Forward, MD;  Location: [facility];  Service: Cardiovascular;  Laterality: N/A;  . CORONARY STENTS    . LEFT HEART CATH AND CORONARY ANGIOGRAPHY N/A 02/02/2017   Procedure: Left Heart Cath and Coronary Angiography;  Surgeon: Charolette Forward, MD;  Location: [facility];  Service: Cardiovascular;  Laterality: N/A;  . LEFT HEART CATHETERIZATION WITH CORONARY ANGIOGRAM N/A 03/16/2012   Procedure: LEFT HEART CATHETERIZATION WITH CORONARY ANGIOGRAM;  Surgeon: Clent Demark, MD;  Location: [facility];  Service: Cardiovascular;  Laterality: N/A;  . PERCUTANEOUS CORONARY STENT INTERVENTION (PCI-S)  03/16/2012   Procedure: PERCUTANEOUS  CORONARY STENT INTERVENTION (PCI-S);  Surgeon: Clent Demark, MD;  Location: [facility];  Service: Cardiovascular;;  . TONSILLECTOMY      No family history on file.  No Known Allergies  Current Outpatient Medications on File Prior to Visit  Medication Sig Dispense Refill  . amLODipine (NORVASC) 10 MG tablet Take 5 mg by mouth 2 (two) times daily.    Marland Kitchen aspirin 81 MG tablet Take 81 mg by mouth daily.      Marland Kitchen atorvastatin (LIPITOR) 80 MG tablet Take 40 mg by mouth daily.    . clopidogrel (PLAVIX) 75 MG tablet Take 75 mg by mouth daily.  3  . diclofenac sodium (VOLTAREN) 1 % GEL Apply 2 g topically 4 (four) times daily as needed (pain). 100 g 0  . glipiZIDE (GLUCOTROL) 10 MG tablet Take 10 mg by mouth 2 (two) times daily before a meal. Take 30 minutes before meals    . losartan (COZAAR) 100 MG tablet Take 50 mg by mouth daily.    . metoprolol succinate (TOPROL-XL) 50 MG 24 hr tablet Take 25 mg by mouth daily. Take with or immediately following a meal.    . Omega-3 Fatty Acids (FISH OIL) 1000 MG CAPS Take 1,000 mg by mouth 2 (two) times daily.    . psyllium (HYDROCIL/METAMUCIL) 95 % PACK Take 1 packet by mouth 2 (two) times daily.     Marland Kitchen senna-docusate (SENOKOT-S) 8.6-50 MG tablet Take 1 tablet by mouth at bedtime. 30 tablet 0  . nitroGLYCERIN (NITROSTAT) 0.4 MG SL tablet Place 1 tablet (0.4 mg total) under the tongue every 5 (five) minutes x 3 doses as needed for chest pain. (Patient not taking: Reported on 01/22/2019) 25 tablet 3   No current facility-administered medications on file prior to visit.     BP 140/86   Pulse 62   Temp 98.6 F (37 C) (Temporal)   Ht 5' 4.75" (1.645 m)   Wt 172 lb 8 oz (78.2 kg)   SpO2 98%   BMI 28.93 kg/m    Objective:   Physical Exam  Constitutional: He appears well-nourished.  Neck: Neck supple.  Cardiovascular: Normal rate and regular rhythm.  Respiratory: Effort normal and breath sounds normal.  Skin: Skin is warm and dry.  Psychiatric: He has  a normal mood and affect.           Assessment & Plan:

## 2019-01-22 NOTE — Assessment & Plan Note (Signed)
Repeat A1C pending. Managed on ARB and statin. Urine microalbumin pending per [facility] request. Eye exam UTD.  Continue glipizide 10 mg BID for now.  Continue glucose checks. Will send labs to [facility] per request.

## 2019-01-22 NOTE — Assessment & Plan Note (Signed)
Asymptomatic.  Continue clopidogrel, aspirin, statin. Repeat Lipid panel pending.

## 2019-01-22 NOTE — Assessment & Plan Note (Signed)
Stable in the office today, also on home readings. Continue current regimen.

## 2019-01-22 NOTE — Assessment & Plan Note (Signed)
Normal on recent labs. Continue to monitor.  

## 2019-01-23 ENCOUNTER — Encounter: Payer: Self-pay | Admitting: *Deleted

## 2019-01-23 NOTE — Telephone Encounter (Signed)
Rosaria Ferries, see notes. He would like to forego CT scan of chest.

## 2019-01-24 ENCOUNTER — Ambulatory Visit: Payer: Medicare Other

## 2019-01-25 ENCOUNTER — Other Ambulatory Visit: Payer: Self-pay

## 2019-01-25 ENCOUNTER — Other Ambulatory Visit: Payer: Self-pay | Admitting: Primary Care

## 2019-01-25 ENCOUNTER — Ambulatory Visit (INDEPENDENT_AMBULATORY_CARE_PROVIDER_SITE_OTHER)
Admission: RE | Admit: 2019-01-25 | Discharge: 2019-01-25 | Disposition: A | Discharge status: Home / Self Care | Payer: Medicare Other | Source: Ambulatory Visit | Attending: Primary Care | Admitting: Primary Care

## 2019-01-25 ENCOUNTER — Ambulatory Visit: Payer: Medicare Other

## 2019-01-25 DIAGNOSIS — R109 Unspecified abdominal pain: Secondary | ICD-10-CM

## 2019-03-06 MED FILL — NITROGLYCERIN 0.4 MG TAB SL: 0.4 | 25 days supply | Qty: 25 | Fill #0

## 2019-04-02 ENCOUNTER — Other Ambulatory Visit: Payer: Self-pay

## 2019-04-02 DIAGNOSIS — Z20822 Contact with and (suspected) exposure to covid-19: Secondary | ICD-10-CM

## 2019-04-03 LAB — NOVEL CORONAVIRUS, NAA: SARS-CoV-2, NAA: NOT DETECTED

## 2019-04-18 ENCOUNTER — Ambulatory Visit (INDEPENDENT_AMBULATORY_CARE_PROVIDER_SITE_OTHER): Payer: Medicare Other

## 2019-04-18 DIAGNOSIS — Z23 Encounter for immunization: Secondary | ICD-10-CM

## 2019-07-09 ENCOUNTER — Ambulatory Visit: Payer: Medicare Other | Attending: Internal Medicine

## 2019-07-09 DIAGNOSIS — Z20822 Contact with and (suspected) exposure to covid-19: Secondary | ICD-10-CM

## 2019-07-10 LAB — NOVEL CORONAVIRUS, NAA: SARS-CoV-2, NAA: NOT DETECTED

## 2019-08-08 ENCOUNTER — Ambulatory Visit: Payer: Medicare Other

## 2019-08-17 ENCOUNTER — Ambulatory Visit: Payer: Medicare Other | Attending: Internal Medicine

## 2019-08-17 DIAGNOSIS — Z23 Encounter for immunization: Secondary | ICD-10-CM | POA: Insufficient documentation

## 2019-08-17 NOTE — Progress Notes (Signed)
   Covid-19 Vaccination Clinic  Name:  Alexander Donaldson    MRN: [id number] DOB: Mar 08, 1934  08/17/2019  Alexander Donaldson was observed post Covid-19 immunization for 15 minutes without incidence. He was provided with Vaccine Information Sheet and instruction to access the V-Safe system.   Alexander Donaldson was instructed to call 911 with any severe reactions post vaccine: Marland Kitchen Difficulty breathing  . Swelling of your face and throat  . A fast heartbeat  . A bad rash all over your body  . Dizziness and weakness    Immunizations Administered    Name Date Dose VIS Date Route   Pfizer COVID-19 Vaccine 08/17/2019  8:44 AM 0.3 mL 06/21/2019 Intramuscular   Manufacturer: [employer], [employer]   Lot: [id number]   NDC: [address]-1

## 2019-08-23 DIAGNOSIS — I5089 Other heart failure: Secondary | ICD-10-CM | POA: Diagnosis not present

## 2019-08-23 DIAGNOSIS — R069 Unspecified abnormalities of breathing: Secondary | ICD-10-CM | POA: Diagnosis not present

## 2019-09-04 DIAGNOSIS — E119 Type 2 diabetes mellitus without complications: Secondary | ICD-10-CM | POA: Diagnosis not present

## 2019-09-04 DIAGNOSIS — I255 Ischemic cardiomyopathy: Secondary | ICD-10-CM | POA: Diagnosis not present

## 2019-09-04 DIAGNOSIS — I480 Paroxysmal atrial fibrillation: Secondary | ICD-10-CM | POA: Diagnosis not present

## 2019-09-04 DIAGNOSIS — I2510 Atherosclerotic heart disease of native coronary artery without angina pectoris: Secondary | ICD-10-CM | POA: Diagnosis not present

## 2019-09-04 DIAGNOSIS — I10 Essential (primary) hypertension: Secondary | ICD-10-CM | POA: Diagnosis not present

## 2019-09-11 ENCOUNTER — Ambulatory Visit: Payer: Medicare Other | Attending: Internal Medicine

## 2019-09-11 DIAGNOSIS — Z23 Encounter for immunization: Secondary | ICD-10-CM | POA: Insufficient documentation

## 2019-09-11 NOTE — Progress Notes (Signed)
   Covid-19 Vaccination Clinic  Name:  Alexander Donaldson    MRN: [id number] DOB: July 27, 1933  09/11/2019  Alexander Donaldson was observed post Covid-19 immunization for 15 minutes without incident. He was provided with Vaccine Information Sheet and instruction to access the V-Safe system.   Alexander Donaldson was instructed to call 911 with any severe reactions post vaccine: Marland Kitchen Difficulty breathing  . Swelling of face and throat  . A fast heartbeat  . A bad rash all over body  . Dizziness and weakness   Immunizations Administered    Name Date Dose VIS Date Route   Pfizer COVID-19 Vaccine 09/11/2019  9:12 AM 0.3 mL 06/21/2019 Intramuscular   Manufacturer: [employer], [employer]   Lot: [id number]   NDC: [address]-9

## 2019-09-20 DIAGNOSIS — R069 Unspecified abnormalities of breathing: Secondary | ICD-10-CM | POA: Diagnosis not present

## 2019-09-20 DIAGNOSIS — I5089 Other heart failure: Secondary | ICD-10-CM | POA: Diagnosis not present

## 2019-10-21 DIAGNOSIS — I5089 Other heart failure: Secondary | ICD-10-CM | POA: Diagnosis not present

## 2019-10-21 DIAGNOSIS — R069 Unspecified abnormalities of breathing: Secondary | ICD-10-CM | POA: Diagnosis not present

## 2019-10-30 ENCOUNTER — Other Ambulatory Visit: Payer: Self-pay

## 2019-10-30 ENCOUNTER — Ambulatory Visit (INDEPENDENT_AMBULATORY_CARE_PROVIDER_SITE_OTHER): Payer: Medicare Other | Admitting: Primary Care

## 2019-10-30 DIAGNOSIS — I7300 Raynaud's syndrome without gangrene: Secondary | ICD-10-CM | POA: Insufficient documentation

## 2019-10-30 NOTE — Progress Notes (Signed)
Subjective:    Patient ID: Alexander Donaldson, male    DOB: [birth year], 86 y.o.   MRN: [id number]  HPI  This visit occurred during the SARS-CoV-2 public health emergency.  Safety protocols were in place, including screening questions prior to the visit, additional usage of staff PPE, and extensive cleaning of exam room while observing appropriate contact time as indicated for disinfecting solutions.   Mr. Ganaway is a 86 year old male with a history of type 2 diabetes, CAD, left foot drop who presents today with a chief complaint of hand discoloration and cold sensation.  Also with chronic aches to bilateral hands from osteoarthritis. His symptoms are located to the right dorsal 2nd and 3rd digits. He will notice a cold sensation, color changes (white) and some numbness at times. This occurs several times weekly and is intermittent lasting a few minutes at a time, symptoms began in January 2021.   He denies numbness to his other fingers or wrist, injury/trauma, weakness, confusion, changes in vision, changes in speech. His wife has placed Voltaren Gel without much improvement. He mostly will try to warm up the fingers with improvement. He called the [place] regarding this and was told that it was "arthritis". He has a brace to pick up later today.  Review of Systems  Eyes: Negative for visual disturbance.  Skin: Positive for color change.  Neurological: Positive for numbness. Negative for dizziness and weakness.       Past Medical History:  Diagnosis Date  . Acute encephalopathy 02/12/2018  . CAP (community acquired pneumonia) 02/11/2018  . Coronary artery disease   . Diabetes mellitus   . Diverticulitis   . Hypertension   . Hypokalemia 02/12/2018     Social History   Socioeconomic History  . Marital status: Married    Spouse name: Not on file  . Number of children: Not on file  . Years of education: Not on file  . Highest education level: Not on file  Occupational History  . Not on file   Tobacco Use  . Smoking status: Never Smoker  . Smokeless tobacco: Never Used  Substance and Sexual Activity  . Alcohol use: No    Comment: social  . Drug use: No  . Sexual activity: Yes    Birth control/protection: None  Other Topics Concern  . Not on file  Social History Narrative  . Not on file   Social Determinants of Health   Financial Resource Strain:   . Difficulty of Paying Living Expenses:   Food Insecurity:   . Worried About Programme researcher, broadcasting/film/video in the Last Year:   . Barista in the Last Year:   Transportation Needs:   . Freight forwarder (Medical):   Marland Kitchen Lack of Transportation (Non-Medical):   Physical Activity:   . Days of Exercise per Week:   . Minutes of Exercise per Session:   Stress:   . Feeling of Stress :   Social Connections:   . Frequency of Communication with Friends and Family:   . Frequency of Social Gatherings with Friends and Family:   . Attends Religious Services:   . Active Member of Clubs or Organizations:   . Attends Banker Meetings:   Marland Kitchen Marital Status:   Intimate Partner Violence:   . Fear of Current or Ex-Partner:   . Emotionally Abused:   Marland Kitchen Physically Abused:   . Sexually Abused:     Past Surgical History:  Procedure Laterality Date  .  ANGIOPLASTY    . CORONARY STENT INTERVENTION N/A 02/02/2017   Procedure: Coronary Stent Intervention;  Surgeon: Charolette Forward, MD;  Location: [facility];  Service: Cardiovascular;  Laterality: N/A;  . CORONARY STENTS    . LEFT HEART CATH AND CORONARY ANGIOGRAPHY N/A 02/02/2017   Procedure: Left Heart Cath and Coronary Angiography;  Surgeon: Charolette Forward, MD;  Location: [facility] [facility];  Service: Cardiovascular;  Laterality: N/A;  . LEFT HEART CATHETERIZATION WITH CORONARY ANGIOGRAM N/A 03/16/2012   Procedure: LEFT HEART CATHETERIZATION WITH CORONARY ANGIOGRAM;  Surgeon: Clent Demark, MD;  Location: [place] [facility];  Service: Cardiovascular;  Laterality: N/A;  .  PERCUTANEOUS CORONARY STENT INTERVENTION (PCI-S)  03/16/2012   Procedure: PERCUTANEOUS CORONARY STENT INTERVENTION (PCI-S);  Surgeon: Clent Demark, MD;  Location[facility];  Service: Cardiovascular;;  . TONSILLECTOMY      No family history on file.  No Known Allergies  Current Outpatient Medications on File Prior to Visit  Medication Sig Dispense Refill  . amLODipine (NORVASC) 10 MG tablet Take 5 mg by mouth 2 (two) times daily.    Marland Kitchen aspirin 81 MG tablet Take 81 mg by mouth daily.      Marland Kitchen atorvastatin (LIPITOR) 80 MG tablet Take 40 mg by mouth daily.    . clopidogrel (PLAVIX) 75 MG tablet Take 75 mg by mouth daily.  3  . diclofenac sodium (VOLTAREN) 1 % GEL Apply 2 g topically 4 (four) times daily as needed (pain). 100 g 0  . glipiZIDE (GLUCOTROL) 10 MG tablet Take 10 mg by mouth 2 (two) times daily before a meal. Take 30 minutes before meals    . losartan (COZAAR) 100 MG tablet Take 50 mg by mouth daily.    . metoprolol succinate (TOPROL-XL) 50 MG 24 hr tablet Take 25 mg by mouth daily. Take with or immediately following a meal.    . nitroGLYCERIN (NITROSTAT) 0.4 MG SL tablet Place 1 tablet (0.4 mg total) under the tongue every 5 (five) minutes x 3 doses as needed for chest pain. 25 tablet 3  . Omega-3 Fatty Acids (FISH OIL) 1000 MG CAPS Take 1,000 mg by mouth 2 (two) times daily.    . psyllium (HYDROCIL/METAMUCIL) 95 % PACK Take 1 packet by mouth 2 (two) times daily.     Marland Kitchen senna-docusate (SENOKOT-S) 8.6-50 MG tablet Take 1 tablet by mouth at bedtime. 30 tablet 0   No current facility-administered medications on file prior to visit.    BP 140/70   Pulse 80   Temp (!) 96.6 F (35.9 C) (Temporal)   Ht 5' 4.75" (1.645 m)   Wt 175 lb (79.4 kg)   SpO2 98%   BMI 29.35 kg/m    Objective:   Physical Exam  Constitutional: He appears well-nourished.  Respiratory: Effort normal.  Musculoskeletal:     Right hand: Normal strength. Normal sensation. Normal capillary refill.        Hands:     Comments: Heberden's nodes noted to digits of bilateral hands.  Skin: Skin is warm and dry. No erythema. No pallor.           Assessment & Plan:

## 2019-10-30 NOTE — Patient Instructions (Signed)
Warm up your fingers when they change color and feel cold.  Wear the [facility] that was [facility] the [facility] for your [facility].   It was a pleasure to see you today!   Raynaud Phenomenon  Raynaud phenomenon is a condition that affects the blood vessels (arteries) that carry blood to your fingers and toes. The arteries that supply blood to your ears, lips, nipples, or the tip of your nose might also be affected. Raynaud phenomenon causes the arteries to become narrow temporarily (spasm). As a result, the flow of blood to the affected areas is temporarily decreased. This usually occurs in response to cold temperatures or stress. During an attack, the skin in the affected areas turns white, then blue, and finally red. You may also feel tingling or numbness in those areas. Attacks usually last for only a brief period, and then the blood flow to the area returns to normal. In most cases, Raynaud phenomenon does not cause serious health problems. What are the causes? In many cases, the cause of this condition is not known. The condition may occur on its own (primary Raynaud phenomenon) or may be associated with other diseases or factors (secondary Raynaud phenomenon). Possible causes may include:  Diseases or medical conditions that damage the arteries.  Injuries and repetitive actions that hurt the hands or feet.  Being exposed to certain chemicals.  Taking medicines that narrow the arteries.  Other medical conditions, such as lupus, scleroderma, rheumatoid arthritis, thyroid problems, blood disorders, Sjogren syndrome, or atherosclerosis. What increases the risk? The following factors may make you more likely to develop this condition:  Being 24-12 years old.  Being male.  Having a family history of Raynaud phenomenon.  Living in a cold climate.  Smoking. What are the signs or symptoms? Symptoms of this condition usually occur when you are exposed to cold temperatures or when you  have emotional stress. The symptoms may last for a few minutes or up to several hours. They usually affect your fingers but may also affect your toes, nipples, lips, ears, or the tip of your nose. Symptoms may include:  Changes in skin color. The skin in the affected areas will turn pale or white. The skin may then change from white to bluish to red as normal blood flow returns to the area.  Numbness, tingling, or pain in the affected areas. In severe cases, symptoms may include:  Skin sores.  Tissues decaying and dying (gangrene). How is this diagnosed? This condition may be diagnosed based on:  Your symptoms and medical history.  A physical exam. During the exam, you may be asked to put your hands in cold water to check for a reaction to cold temperature.  Tests, such as: ? Blood tests to check for other diseases or conditions. ? A test to check the movement of blood through your arteries and veins (vascular ultrasound). ? A test in which the skin at the base of your fingernail is examined under a microscope (nailfold capillaroscopy). How is this treated? Treatment for this condition often involves making lifestyle changes and taking steps to control your exposure to cold temperatures. For more severe cases, medicine (calcium channel blockers) may be used to improve blood flow. Surgery is sometimes done to block the nerves that control the affected arteries, but this is rare. Follow these instructions at home: Avoiding cold temperatures Take these steps to avoid exposure to cold:  If possible, stay indoors during cold weather.  When you go outside during cold  weather, dress in layers and wear mittens, a hat, a scarf, and warm footwear.  Wear mittens or gloves when handling ice or frozen food.  Use holders for glasses or cans containing cold drinks.  Let warm water run for a while before taking a shower or bath.  Warm up the car before driving in cold weather. Lifestyle   If  possible, avoid stressful and emotional situations. Try to find ways to manage your stress, such as: ? Exercise. ? Yoga. ? Meditation. ? Biofeedback.  Do not use any products that contain nicotine or tobacco, such as cigarettes and e-cigarettes. If you need help quitting, ask your health care provider.  Avoid secondhand smoke.  Limit your use of caffeine. ? Switch to decaffeinated coffee, tea, and soda. ? Avoid chocolate.  Avoid vibrating tools and machinery. General instructions  Protect your hands and feet from injuries, cuts, or bruises.  Avoid wearing tight rings or wristbands.  Wear loose fitting socks and comfortable, roomy shoes.  Take over-the-counter and prescription medicines only as told by your health care provider. Contact a health care provider if:  Your discomfort becomes worse despite lifestyle changes.  You develop sores on your fingers or toes that do not heal.  Your fingers or toes turn black.  You have breaks in the skin on your fingers or toes.  You have a fever.  You have pain or swelling in your joints.  You have a rash.  Your symptoms occur on only one side of your body. Summary  Raynaud phenomenon is a condition that affects the arteries that carry blood to your fingers, toes, ears, lips, nipples, or the tip of your nose.  In many cases, the cause of this condition is not known.  Symptoms of this condition include changes in skin color, and numbness and tingling of the affected area.  Treatment for this condition includes lifestyle changes, reducing exposure to cold temperatures, and using medicines for severe cases of the condition.  Contact your health care provider if your condition worsens despite treatment. This information is not intended to replace advice given to you by your health care provider. Make sure you discuss any questions you have with your health care provider. Document Revised: 06/30/2017 Document Reviewed:  08/08/2016 Elsevier Patient Education  2020 [employer].

## 2019-10-30 NOTE — Assessment & Plan Note (Signed)
Suspect symptoms are secondary to raynaud's phenomenon. Discussed conservative management and that symptoms should improve as weather gets warmer.   Handout provided. He and family will update.

## 2019-11-19 ENCOUNTER — Encounter: Payer: Self-pay | Admitting: General Practice

## 2019-11-19 DIAGNOSIS — H5203 Hypermetropia, bilateral: Secondary | ICD-10-CM | POA: Diagnosis not present

## 2019-11-19 DIAGNOSIS — E113293 Type 2 diabetes mellitus with mild nonproliferative diabetic retinopathy without macular edema, bilateral: Secondary | ICD-10-CM | POA: Diagnosis not present

## 2019-11-19 DIAGNOSIS — H524 Presbyopia: Secondary | ICD-10-CM | POA: Diagnosis not present

## 2019-11-19 DIAGNOSIS — H2513 Age-related nuclear cataract, bilateral: Secondary | ICD-10-CM | POA: Diagnosis not present

## 2019-11-19 LAB — HM DIABETES EYE EXAM

## 2019-11-20 ENCOUNTER — Encounter: Payer: Self-pay | Admitting: Primary Care

## 2019-11-20 DIAGNOSIS — R069 Unspecified abnormalities of breathing: Secondary | ICD-10-CM | POA: Diagnosis not present

## 2019-11-20 DIAGNOSIS — I5089 Other heart failure: Secondary | ICD-10-CM | POA: Diagnosis not present

## 2019-11-28 ENCOUNTER — Ambulatory Visit (INDEPENDENT_AMBULATORY_CARE_PROVIDER_SITE_OTHER): Payer: Medicare Other | Admitting: Primary Care

## 2019-11-28 ENCOUNTER — Other Ambulatory Visit: Payer: Self-pay

## 2019-11-28 ENCOUNTER — Encounter: Payer: Self-pay | Admitting: Primary Care

## 2019-11-28 VITALS — BP 134/80 | HR 80 | Temp 97.0°F | Ht 64.75 in | Wt 173.8 lb

## 2019-11-28 DIAGNOSIS — E1159 Type 2 diabetes mellitus with other circulatory complications: Secondary | ICD-10-CM | POA: Diagnosis not present

## 2019-11-28 DIAGNOSIS — I10 Essential (primary) hypertension: Secondary | ICD-10-CM | POA: Diagnosis not present

## 2019-11-28 DIAGNOSIS — I2510 Atherosclerotic heart disease of native coronary artery without angina pectoris: Secondary | ICD-10-CM

## 2019-11-28 DIAGNOSIS — R200 Anesthesia of skin: Secondary | ICD-10-CM

## 2019-11-28 LAB — LIPID PANEL
Cholesterol: 150 mg/dL (ref 0–200)
HDL: 43.2 mg/dL (ref >39.00)
LDL Cholesterol: 86 mg/dL (ref 0–99)
NonHDL: 106.33
Total CHOL/HDL Ratio: 3
Triglycerides: 100 mg/dL (ref 0.0–149.0)
VLDL: 20 mg/dL (ref 0.0–40.0)

## 2019-11-28 LAB — COMPREHENSIVE METABOLIC PANEL
ALT: 17 U/L (ref 0–53)
AST: 21 U/L (ref 0–37)
Albumin: 4.4 g/dL (ref 3.5–5.2)
Alkaline Phosphatase: 82 U/L (ref 39–117)
BUN: 16 mg/dL (ref 6–23)
CO2: 30 mEq/L (ref 19–32)
Calcium: 9.2 mg/dL (ref 8.4–10.5)
Chloride: 100 mEq/L (ref 96–112)
Creatinine, Ser: 1.01 mg/dL (ref 0.40–1.50)
GFR: 85.17 mL/min (ref >60.00)
Glucose, Bld: 134 mg/dL — ABNORMAL HIGH (ref 70–99)
Potassium: 3.8 mEq/L (ref 3.5–5.1)
Sodium: 136 mEq/L (ref 135–145)
Total Bilirubin: 1.1 mg/dL (ref 0.2–1.2)
Total Protein: 6.8 g/dL (ref 6.0–8.3)

## 2019-11-28 LAB — HEMOGLOBIN A1C: Hgb A1c MFr Bld: 8.5 % — ABNORMAL HIGH (ref 4.6–6.5)

## 2019-11-28 LAB — VITAMIN B12: Vitamin B-12: 284 pg/mL (ref 211–911)

## 2019-11-28 NOTE — Assessment & Plan Note (Signed)
Stable in the office today, continue losartan and metoprolol.

## 2019-11-28 NOTE — Patient Instructions (Addendum)
You will be contacted regarding your referral to neurology.  Please let us know if you have not been contacted within two weeks.   Stop by the lab prior to leaving today. I will notify you of your results once received.  It was a pleasure to see you today!

## 2019-11-28 NOTE — Assessment & Plan Note (Signed)
Asymptomatic. Compliant to clopidogrel, statin, metoprolol.  No use of nitroglycerin.   Repeat lipids and CMP pending.

## 2019-11-28 NOTE — Assessment & Plan Note (Signed)
Repeat A1C pending today. Compliant to Glipizide.   Eye exam UTD per patient.  Managed on ARB and statin.  Encouraged a healthy diet and regular activity.

## 2019-11-28 NOTE — Progress Notes (Signed)
Subjective:    Patient ID: Alexander Donaldson, male    DOB: [birth year], 86 y.o.   MRN: [id number]  HPI  This visit occurred during the SARS-CoV-2 public health emergency.  Safety protocols were in place, including screening questions prior to the visit, additional usage of staff PPE, and extensive cleaning of exam room while observing appropriate contact time as indicated for disinfecting solutions.   Alexander Donaldson is a 86 year old male with a history of type 2 diabetes, hypertension, CAD who presents today with a chief complaint of numbness. He is also due for general follow up including diabetes. He does follow with the [place], has not been seen in about one year.   1) Numbness: He was last evaluated on 10/30/19 with reports of bilateral arthralgias to the hands, discoloration and cold sensation to 2nd and 3rd digits of right hand that began in January 2021. Symptoms improve with application of mild heat. Symptoms were suspected to be secondary to Raynaud's Phenomenon.  Since his last visit he continues to notice numbness to the 2nd and 3rd digits on the right hand. He denies pain, injury, erythema. His remaining digits are without numbness.   He denies neck pain, but does have chronic shoulder pain from "rotator cuff" problems. Overall his shoulders really haven't bothered him.  He works on a computer four hours daily nearly everyday, plays games with his right fingers. He continues to notice discoloration at times to the fingertips which improve with warming.   He denies unilateral weakness, slurred speech, dizziness, headaches. His wife endorses gradual memory loss over the years but denies acute confusion.   2) Type 2 Diabetes:  Current medications include: Glipizide 10 mg BID.  Last A1C: 6.8 in July 2020 Last Eye Exam: UTD per patient's wife Last Foot Exam: Due Pneumonia Vaccination: UTD per patient's wife ACE/ARB: Losartan  Statin: Atorvastatin    BP Readings from Last 3 Encounters:   11/28/19 134/80  10/30/19 140/70  01/22/19 140/86       Review of Systems  Respiratory: Negative for shortness of breath.   Cardiovascular: Negative for chest pain.  Musculoskeletal: Positive for arthralgias and joint swelling. Negative for neck pain.  Skin: Negative for color change.  Neurological: Positive for numbness. Negative for dizziness, speech difficulty, weakness and headaches.       Past Medical History:  Diagnosis Date  . Acute encephalopathy 02/12/2018  . CAP (community acquired pneumonia) 02/11/2018  . Coronary artery disease   . Diabetes mellitus   . Diverticulitis   . Hypertension   . Hypokalemia 02/12/2018     Social History   Socioeconomic History  . Marital status: Married    Spouse name: Not on file  . Number of children: Not on file  . Years of education: Not on file  . Highest education level: Not on file  Occupational History  . Not on file  Tobacco Use  . Smoking status: Never Smoker  . Smokeless tobacco: Never Used  Substance and Sexual Activity  . Alcohol use: No    Comment: social  . Drug use: No  . Sexual activity: Yes    Birth control/protection: None  Other Topics Concern  . Not on file  Social History Narrative  . Not on file   Social Determinants of Health   Financial Resource Strain:   . Difficulty of Paying Living Expenses:   Food Insecurity:   . Worried About Charity fundraiser in the Last Year:   .  Ran Out of Food in the Last Year:   Transportation Needs:   . Freight forwarder (Medical):   Marland Kitchen Lack of Transportation (Non-Medical):   Physical Activity:   . Days of Exercise per Week:   . Minutes of Exercise per Session:   Stress:   . Feeling of Stress :   Social Connections:   . Frequency of Communication with Friends and Family:   . Frequency of Social Gatherings with Friends and Family:   . Attends Religious Services:   . Active Member of Clubs or Organizations:   . Attends Banker Meetings:   Marland Kitchen  Marital Status:   Intimate Partner Violence:   . Fear of Current or Ex-Partner:   . Emotionally Abused:   Marland Kitchen Physically Abused:   . Sexually Abused:     Past Surgical History:  Procedure Laterality Date  . ANGIOPLASTY    . CORONARY STENT INTERVENTION N/A 02/02/2017   Procedure: Coronary Stent Intervention;  Surgeon: Rinaldo Cloud, MD;  Location: [facility] INVASIVE CV LAB;  Service: Cardiovascular;  Laterality: N/A;  . CORONARY STENTS    . LEFT HEART CATH AND CORONARY ANGIOGRAPHY N/A 02/02/2017   Procedure: Left Heart Cath and Coronary Angiography;  Surgeon: Rinaldo Cloud, MD;  Location: [facility] INVASIVE CV LAB;  Service: Cardiovascular;  Laterality: N/A;  . LEFT HEART CATHETERIZATION WITH CORONARY ANGIOGRAM N/A 03/16/2012   Procedure: LEFT HEART CATHETERIZATION WITH CORONARY ANGIOGRAM;  Surgeon: Robynn Pane, MD;  Location: [facility] CATH LAB;  Service: Cardiovascular;  Laterality: N/A;  . PERCUTANEOUS CORONARY STENT INTERVENTION (PCI-S)  03/16/2012   Procedure: PERCUTANEOUS CORONARY STENT INTERVENTION (PCI-S);  Surgeon: Robynn Pane, MD;  Location: [facility] CATH LAB;  Service: Cardiovascular;;  . TONSILLECTOMY      No family history on file.  No Known Allergies  Current Outpatient Medications on File Prior to Visit  Medication Sig Dispense Refill  . amLODipine (NORVASC) 10 MG tablet Take 5 mg by mouth 2 (two) times daily.    Marland Kitchen aspirin 81 MG tablet Take 81 mg by mouth daily.      Marland Kitchen atorvastatin (LIPITOR) 80 MG tablet Take 40 mg by mouth daily.    . clopidogrel (PLAVIX) 75 MG tablet Take 75 mg by mouth daily.  3  . diclofenac sodium (VOLTAREN) 1 % GEL Apply 2 g topically 4 (four) times daily as needed (pain). 100 g 0  . glipiZIDE (GLUCOTROL) 10 MG tablet Take 10 mg by mouth 2 (two) times daily before a meal. Take 30 minutes before meals    . losartan (COZAAR) 100 MG tablet Take 50 mg by mouth daily.    . metoprolol succinate (TOPROL-XL) 50 MG 24 hr tablet Take 25 mg by mouth daily. Take with or  immediately following a meal.    . Omega-3 Fatty Acids (FISH OIL) 1000 MG CAPS Take 1,000 mg by mouth 2 (two) times daily.    . psyllium (HYDROCIL/METAMUCIL) 95 % PACK Take 1 packet by mouth 2 (two) times daily.     Marland Kitchen senna-docusate (SENOKOT-S) 8.6-50 MG tablet Take 1 tablet by mouth at bedtime. 30 tablet 0  . nitroGLYCERIN (NITROSTAT) 0.4 MG SL tablet Place 1 tablet (0.4 mg total) under the tongue every 5 (five) minutes x 3 doses as needed for chest pain. 25 tablet 3   No current facility-administered medications on file prior to visit.    BP 134/80   Pulse 80   Temp (!) 97 F (36.1 C) (Temporal)   Ht 5' 4.75" (  1.645 m)   Wt 173 lb 12 oz (78.8 kg)   SpO2 98%   BMI 29.14 kg/m    Objective:   Physical Exam  Constitutional: He is oriented to person, place, and time. He appears well-nourished.  Eyes: EOM are normal.  Cardiovascular: Normal rate.  Respiratory: Effort normal.  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 No erythema.   Neurological: He is alert and oriented to person, place, and time.  Decreased sensation to 2nd and 3rd palmer fingers on right hand. Grips equal bilaterally.  Skin: Skin is warm and dry. No erythema.           Assessment & Plan:

## 2019-11-28 NOTE — Assessment & Plan Note (Signed)
Chronic since January 2021. Appears to be nerve involvement, perhaps from arthritis?  Checking B12 today. [facility]   Referral placed to neurology for evaluation.

## 2019-12-03 ENCOUNTER — Encounter: Payer: Self-pay | Admitting: *Deleted

## 2019-12-04 DIAGNOSIS — E119 Type 2 diabetes mellitus without complications: Secondary | ICD-10-CM | POA: Diagnosis not present

## 2019-12-04 DIAGNOSIS — I255 Ischemic cardiomyopathy: Secondary | ICD-10-CM | POA: Diagnosis not present

## 2019-12-04 DIAGNOSIS — E1140 Type 2 diabetes mellitus with diabetic neuropathy, unspecified: Secondary | ICD-10-CM | POA: Diagnosis not present

## 2019-12-04 DIAGNOSIS — I480 Paroxysmal atrial fibrillation: Secondary | ICD-10-CM | POA: Diagnosis not present

## 2019-12-04 DIAGNOSIS — I2510 Atherosclerotic heart disease of native coronary artery without angina pectoris: Secondary | ICD-10-CM | POA: Diagnosis not present

## 2019-12-21 DIAGNOSIS — R069 Unspecified abnormalities of breathing: Secondary | ICD-10-CM | POA: Diagnosis not present

## 2019-12-21 DIAGNOSIS — I5089 Other heart failure: Secondary | ICD-10-CM | POA: Diagnosis not present

## 2019-12-25 ENCOUNTER — Ambulatory Visit: Payer: Medicare Other | Admitting: Urology

## 2020-01-20 ENCOUNTER — Other Ambulatory Visit: Payer: Self-pay

## 2020-01-20 ENCOUNTER — Encounter: Payer: Self-pay | Admitting: Neurology

## 2020-01-20 ENCOUNTER — Ambulatory Visit: Payer: Medicare Other | Admitting: Neurology

## 2020-01-20 VITALS — BP 149/61 | HR 59 | Ht 64.0 in | Wt 172.4 lb

## 2020-01-20 DIAGNOSIS — R200 Anesthesia of skin: Secondary | ICD-10-CM | POA: Diagnosis not present

## 2020-01-20 DIAGNOSIS — I5089 Other heart failure: Secondary | ICD-10-CM | POA: Diagnosis not present

## 2020-01-20 DIAGNOSIS — R202 Paresthesia of skin: Secondary | ICD-10-CM | POA: Diagnosis not present

## 2020-01-20 DIAGNOSIS — R069 Unspecified abnormalities of breathing: Secondary | ICD-10-CM | POA: Diagnosis not present

## 2020-01-20 DIAGNOSIS — G5603 Carpal tunnel syndrome, bilateral upper limbs: Secondary | ICD-10-CM

## 2020-01-20 MED ORDER — GABAPENTIN 100 MG PO CAPS: 100.0000 mg | ORAL_CAPSULE | Freq: Three times a day (TID) | ORAL | 1 refills | Status: DC

## 2020-01-20 MED FILL — NITROGLYCERIN 0.4 MG TAB SL: 0.4 | 25 days supply | Qty: 25 | Fill #0

## 2020-01-20 MED FILL — GABAPENTIN 100 MG CAPSULE: 100 | 30 days supply | Qty: 90 | Fill #0

## 2020-01-20 NOTE — Patient Instructions (Signed)
I had a long discussion with the patient and his wife regarding his bilateral hand numbness which likely represents carpal tunnel syndrome.  I advised him to avoid activities which involve rapid repetitive wrist flexion movements and to wear a wrist extension splint at all times.  Trial of gabapentin 100 mg twice daily to help with pain and paresthesias to be increased if tolerated after 1 week to 3 times daily.  Check EMG nerve conduction study.  I also advised him to maintain strict control of diabetes.  He will return for follow-up in the future in 2 months or call earlier if necessary.  Carpal Tunnel Syndrome  Carpal tunnel syndrome is a condition that causes pain in your hand and arm. The carpal tunnel is a narrow area that is on the palm side of your wrist. Repeated wrist motion or certain diseases may cause swelling in the tunnel. This swelling can pinch the main nerve in the wrist (median nerve). What are the causes? This condition may be caused by:  Repeated wrist motions.  Wrist injuries.  Arthritis.  A sac of fluid (cyst) or abnormal growth (tumor) in the carpal tunnel.  Fluid buildup during pregnancy. Sometimes the cause is not known. What increases the risk? The following factors may make you more likely to develop this condition:  Having a job in which you move your wrist in the same way many times. This includes jobs like being a Midwife or a Conservation officer, nature.  Being a woman.  Having other health conditions, such as: ? Diabetes. ? Obesity. ? A thyroid gland that is not active enough (hypothyroidism). ? Kidney failure. What are the signs or symptoms? Symptoms of this condition include:  A tingling feeling in your fingers.  Tingling or a loss of feeling (numbness) in your hand.  Pain in your entire arm. This pain may get worse when you bend your wrist and elbow for a long time.  Pain in your wrist that goes up your arm to your shoulder.  Pain that goes down into your palm  or fingers.  A weak feeling in your hands. You may find it hard to grab and hold items. You may feel worse at night. How is this diagnosed? This condition is diagnosed with a medical history and physical exam. You may also have tests, such as:  Electromyogram (EMG). This test checks the signals that the nerves send to the muscles.  Nerve conduction study. This test checks how well signals pass through your nerves.  Imaging tests, such as X-rays, ultrasound, and MRI. These tests check for what might be the cause of your condition. How is this treated? This condition may be treated with:  Lifestyle changes. You will be asked to stop or change the activity that caused your problem.  Doing exercise and activities that make bones and muscles stronger (physical therapy).  Learning how to use your hand again (occupational therapy).  Medicines for pain and swelling (inflammation). You may have injections in your wrist.  A wrist splint.  Surgery. Follow these instructions at home: If you have a splint:  Wear the splint as told by your doctor. Remove it only as told by your doctor.  Loosen the splint if your fingers: ? Tingle. ? Lose feeling (become numb). ? Turn cold and blue.  Keep the splint clean.  If the splint is not waterproof: ? Do not let it get wet. ? Cover it with a watertight covering when you take a bath or a shower. Managing  pain, stiffness, and swelling   If told, put ice on the painful area: ? If you have a removable splint, remove it as told by your doctor. ? Put ice in a plastic bag. ? Place a towel between your skin and the bag. ? Leave the ice on for 20 minutes, 2-3 times per day. General instructions  Take over-the-counter and prescription medicines only as told by your doctor.  Rest your wrist from any activity that may cause pain. If needed, talk with your boss at work about changes that can help your wrist heal.  Do any exercises as told by your  doctor, physical therapist, or occupational therapist.  Keep all follow-up visits as told by your doctor. This is important. Contact a doctor if:  You have new symptoms.  Medicine does not help your pain.  Your symptoms get worse. Get help right away if:  You have very bad numbness or tingling in your wrist or hand. Summary  Carpal tunnel syndrome is a condition that causes pain in your hand and arm.  It is often caused by repeated wrist motions.  Lifestyle changes and medicines are used to treat this problem. Surgery may help in very bad cases.  Follow your doctor's instructions about wearing a splint, resting your wrist, keeping follow-up visits, and calling for help. This information is not intended to replace advice given to you by your health care provider. Make sure you discuss any questions you have with your health care provider. Document Revised: 11/03/2017 Document Reviewed: 11/03/2017 Elsevier Patient Education  2020 [employer].

## 2020-01-20 NOTE — Progress Notes (Signed)
[facility] [address] [place]. [place] [address] [phone]       OFFICE CONSULT NOTE  [name] Date of Birth:  12-01-1933 Medical Record Number:  [id number]   Referring MD: Sammuel Cooper, NP Reason for Referral: Hand numbness HPI: [name] is a pleasant 86 year old African-American male with past medical history of diabetes, hypertension, diverticulitis, coronary artery disease who has been complaining of bilateral hand numbness for the last 6 months.  Reports it is intermittently but most prominently at night or early in the morning when he wakes up with his right thumb and index finger feeling stiff and numb.  He feels his strength is good and he can still hold objects very well but his fingers feel different.  This is often severe at night and he often wakes up with this.  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  He has not been wearing any wrist splints.  Denies any neck pain, radicular pain, gait or balance problems.  Review of electronic medical records show that he had lab work on 11/28/2019 which include vitamin B12 which was 284 mg percent and hemoglobin A1c was elevated at 8.5.  Comprehensive metabolic panel labs were normal.  TSH was 1.91 on 01/22/2019.  She has not had any x-rays of his C-spine, MRI or EMG nerve conduction studies done.  ROS:   14 system review of systems is positive for hand numbness, pain, decreased hearing only and all other systems negative  PMH:  Past Medical History:  Diagnosis Date  . Acute encephalopathy 02/12/2018  . CAP (community acquired pneumonia) 02/11/2018  . Coronary artery disease   . Diabetes mellitus   . Diverticulitis   . Hypertension   . Hypokalemia 02/12/2018    Social History:  Social History   Socioeconomic History  . Marital status: Married    Spouse name: Not on file  . Number of children: Not on file  . Years  of education: Not on file  . Highest education level: Not on file  Occupational History  . Not on file  Tobacco Use  . Smoking status: Never Smoker  . Smokeless tobacco: Never Used  Substance and Sexual Activity  . Alcohol use: No    Comment: social  . Drug use: No  . Sexual activity: Yes    Birth control/protection: None  Other Topics Concern  . Not on file  Social History Narrative  . Not on file   Social Determinants of Health   Financial Resource Strain:   . Difficulty of Paying Living Expenses:   Food Insecurity:   . Worried About Programme researcher, broadcasting/film/video in the Last Year:   . Barista in the Last Year:   Transportation Needs:   . Freight forwarder (Medical):   Marland Kitchen Lack of Transportation (Non-Medical):   Physical Activity:   . Days of Exercise per Week:   . Minutes of Exercise per Session:   Stress:   . Feeling of Stress :   Social Connections:   . Frequency of Communication with Friends and Family:   . Frequency of Social Gatherings with Friends and Family:   . Attends Religious Services:   . Active Member of Clubs or Organizations:   . Attends Banker Meetings:   Marland Kitchen Marital Status:   Intimate Partner Violence:   . Fear  of Current or Ex-Partner:   . Emotionally Abused:   Marland Kitchen Physically Abused:   . Sexually Abused:     Medications:   Current Outpatient Medications on File Prior to Visit  Medication Sig Dispense Refill  . amLODipine (NORVASC) 10 MG tablet Take 5 mg by mouth 2 (two) times daily.    Marland Kitchen aspirin 81 MG tablet Take 81 mg by mouth daily.      Marland Kitchen atorvastatin (LIPITOR) 80 MG tablet Take 40 mg by mouth daily.    . clopidogrel (PLAVIX) 75 MG tablet Take 75 mg by mouth daily.  3  . diclofenac sodium (VOLTAREN) 1 % GEL Apply 2 g topically 4 (four) times daily as needed (pain). 100 g 0  . glipiZIDE (GLUCOTROL) 10 MG tablet Take 10 mg by mouth 2 (two) times daily before a meal. Take 30 minutes before meals    . losartan (COZAAR) 100 MG  tablet Take 100 mg by mouth daily.     . metoprolol succinate (TOPROL-XL) 50 MG 24 hr tablet Take 50 mg by mouth daily. Take with or immediately following a meal.     . nitroGLYCERIN (NITROSTAT) 0.4 MG SL tablet Place 1 tablet (0.4 mg total) under the tongue every 5 (five) minutes x 3 doses as needed for chest pain. 25 tablet 3  . Omega-3 Fatty Acids (FISH OIL) 1000 MG CAPS Take 1,000 mg by mouth 2 (two) times daily.    . psyllium (HYDROCIL/METAMUCIL) 95 % PACK Take 1 packet by mouth 2 (two) times daily.     Marland Kitchen senna-docusate (SENOKOT-S) 8.6-50 MG tablet Take 1 tablet by mouth at bedtime. 30 tablet 0   No current facility-administered medications on file prior to visit.    Allergies:  No Known Allergies  Physical Exam General: well developed, well nourished elderly African-American male, seated, in no evident distress Head: head normocephalic and atraumatic.   Neck: supple with no carotid or supraclavicular bruits Cardiovascular: regular rate and rhythm, no murmurs Musculoskeletal: no deformity Skin:  no rash/petichiae Vascular:  Normal pulses all extremities  Neurologic Exam Mental Status: Awake and fully alert. Oriented to place and time. Recent and remote memory intact. Attention span, concentration and fund of knowledge appropriate. Mood and affect appropriate.  Cranial Nerves: Fundoscopic exam reveals sharp disc margins. Pupils equal, briskly reactive to light. Extraocular movements full without nystagmus. Visual fields full to confrontation. Hearing diminished bilaterally facial sensation intact. Face, tongue, palate moves normally and symmetrically.  Motor: Normal bulk and tone. Normal strength in all tested extremity muscles. Sensory.:  diminished touch pinprick sensation in the right thumb and index finger only.  Positive Tinel sign on both wrists right greater than left.  Diminished vibration sensations over ankles bilaterally.  Romberg sign is weakly positive. Coordination: Rapid  alternating movements normal in all extremities. Finger-to-nose and heel-to-shin performed accurately bilaterally. Gait and Station: Arises from chair without difficulty. Stance is normal. Gait demonstrates normal stride length and balance . Able to heel, toe and tandem walk with great difficulty.  Reflexes: 1+ and symmetric in upper extremities and both knee and ankle jerks are absent.. Toes downgoing.       ASSESSMENT: 86 year old African-American male with bilateral hand right greater than left weakness likely secondary to carpal tunnel syndrome.  Cervical radiculopathy and underlying diabetic neuropathy felt to be less likely    PLAN: I had a long discussion with the patient and his wife regarding his bilateral hand numbness which likely represents carpal tunnel syndrome.  I advised  him to avoid activities which involve rapid repetitive wrist flexion movements and to wear a wrist extension splint at all times.  Trial of gabapentin 100 mg twice daily to help with pain and paresthesias to be increased if tolerated after 1 week to 3 times daily.  Check EMG nerve conduction study.  I also advised him to maintain strict control of diabetes.  Greater than 50% time during this 45-minute consultation visit was spent on counseling and coordination of care about his hand numbness and carpal tunnel and answering questions.  He will return for follow-up in the future in 2 months or call earlier if necessary. Delia Heady, MD  [facility] [address] [place], [place] [address]  Phone [phone] Fax [phone] Note: This document was prepared with digital dictation and possible smart phrase technology. Any transcriptional errors that result from this process are unintentional.

## 2020-02-20 DIAGNOSIS — R069 Unspecified abnormalities of breathing: Secondary | ICD-10-CM | POA: Diagnosis not present

## 2020-02-20 DIAGNOSIS — I5089 Other heart failure: Secondary | ICD-10-CM | POA: Diagnosis not present

## 2020-02-25 ENCOUNTER — Other Ambulatory Visit: Payer: Self-pay

## 2020-02-25 ENCOUNTER — Ambulatory Visit (INDEPENDENT_AMBULATORY_CARE_PROVIDER_SITE_OTHER): Payer: Medicare Other | Admitting: Neurology

## 2020-02-25 ENCOUNTER — Ambulatory Visit: Payer: Medicare Other | Admitting: Neurology

## 2020-02-25 ENCOUNTER — Encounter: Payer: Self-pay | Admitting: Neurology

## 2020-02-25 DIAGNOSIS — R202 Paresthesia of skin: Secondary | ICD-10-CM | POA: Diagnosis not present

## 2020-02-25 DIAGNOSIS — R200 Anesthesia of skin: Secondary | ICD-10-CM | POA: Diagnosis not present

## 2020-02-25 DIAGNOSIS — G5603 Carpal tunnel syndrome, bilateral upper limbs: Secondary | ICD-10-CM

## 2020-02-25 HISTORY — DX: Carpal tunnel syndrome, bilateral upper limbs: G56.03

## 2020-02-25 NOTE — Progress Notes (Signed)
Please refer to EMG and nerve conduction procedure note.  

## 2020-02-25 NOTE — Procedures (Signed)
     HISTORY:  Alexander Donaldson is an 86 year old gentleman with history of diabetes who has had about a 1 year history of numbness in the hands, right greater than left.  He denies any neck pain or pain down the arms on either side.  He is being evaluated for the above issue.  NERVE CONDUCTION STUDIES:  Nerve conduction studies were performed on both upper extremities.  No response was seen for the median nerves bilaterally.  The distal motor latencies and motor amplitudes for the ulnar nerves were normal bilaterally with normal nerve conduction velocities seen for these nerves.  No response was seen for the median sensory latencies bilaterally, the ulnar sensory latencies were normal bilaterally.  The F-wave latencies for the ulnar nerves were normal bilaterally.  EMG STUDIES:  EMG study was performed on the right upper extremity:  The first dorsal interosseous muscle reveals 2 to 4 K units with full recruitment. No fibrillations or positive waves were noted. The abductor pollicis brevis muscle reveals no voluntary motor units with no recruitment. No fibrillations or positive waves were noted. The extensor indicis proprius muscle reveals 1 to 3 K units with full recruitment. No fibrillations or positive waves were noted. The pronator teres muscle reveals 2 to 3 K units with full recruitment. No fibrillations or positive waves were noted. The biceps muscle reveals 1 to 4 K units with slightly decreased recruitment. No fibrillations or positive waves were noted. The triceps muscle reveals 2 to 4 K units with full recruitment. No fibrillations or positive waves were noted. The anterior deltoid muscle reveals 2 to 6 K units with slightly decreased recruitment.  Fast firing motor units were seen.  No fibrillations or positive waves were noted. The cervical paraspinal muscles were tested at 2 levels. No abnormalities of insertional activity were seen at either level tested. There was fair  relaxation.   IMPRESSION:  Nerve conduction studies done on both upper extremities shows evidence of end-stage carpal tunnel syndrome bilaterally.  EMG evaluation of the right upper extremity shows no response for the APB muscle again suggesting an end-stage carpal tunnel syndrome.  There is evidence of an overlying chronic stable C5 radiculopathy.  Marlan Palau MD 02/25/2020 2:09 PM  [facility] [address] [place], [place] [address]  Phone [phone] Fax [phone]

## 2020-02-25 NOTE — Progress Notes (Signed)
[facility]    Nerve / Sites Muscle Latency Ref. Amplitude Ref. Rel Amp Segments Distance Velocity Ref. Area    ms ms mV mV %  cm m/s m/s mVms  L Median - APB     Wrist APB NR ?4.4 NR ?4.0 NR Wrist - APB 7   NR     Upper arm APB NR  NR  NR Upper arm - Wrist 25 NR ?49 NR  R Median - APB     Wrist APB NR ?4.4 NR ?4.0 NR Wrist - APB 7   NR     Upper arm APB NR  NR  NR Upper arm - Wrist 24 NR ?49 NR  L Ulnar - ADM     Wrist ADM 3.0 ?3.3 8.5 ?6.0 100 Wrist - ADM 7   25.7     B.Elbow ADM 7.8  6.3  73.9 B.Elbow - Wrist 24 50 ?49 19.7     A.Elbow ADM 9.7  6.9  110 A.Elbow - B.Elbow 10 52 ?49 22.5         A.Elbow - Wrist      R Ulnar - ADM     Wrist ADM 2.9 ?3.3 7.1 ?6.0 100 Wrist - ADM 7   18.9     B.Elbow ADM 6.8  5.2  73.6 B.Elbow - Wrist 21 54 ?49 15.3     A.Elbow ADM 8.8  5.0  96.1 A.Elbow - B.Elbow 10 51 ?49 19.3         A.Elbow - Wrist                 SNC    Nerve / Sites Rec. Site Peak Lat Ref.  Amp Ref. Segments Distance    ms ms V V  cm  L Median - Orthodromic (Dig II, Mid palm)     Dig II Wrist NR ?3.4 NR ?10 Dig II - Wrist 13  R Median - Orthodromic (Dig II, Mid palm)     Dig II Wrist NR ?3.4 NR ?10 Dig II - Wrist 13  L Ulnar - Orthodromic, (Dig V, Mid palm)     Dig V Wrist 2.9 ?3.1 5 ?5 Dig V - Wrist 11  R Ulnar - Orthodromic, (Dig V, Mid palm)     Dig V Wrist 3.1 ?3.1 5 ?5 Dig V - Wrist 65             F  Wave    Nerve F Lat Ref.   ms ms  L Ulnar - ADM 28.5 ?32.0  R Ulnar - ADM 31.4 ?32.0

## 2020-02-26 ENCOUNTER — Ambulatory Visit (INDEPENDENT_AMBULATORY_CARE_PROVIDER_SITE_OTHER): Payer: Medicare Other | Admitting: Primary Care

## 2020-02-26 DIAGNOSIS — G5603 Carpal tunnel syndrome, bilateral upper limbs: Secondary | ICD-10-CM

## 2020-02-26 DIAGNOSIS — R200 Anesthesia of skin: Secondary | ICD-10-CM | POA: Diagnosis not present

## 2020-02-26 NOTE — Assessment & Plan Note (Signed)
New diagnosis, evaluated by neurology. Reassurance provided today as the only major complication is chronic numbness to two digits. We discussed the results in great detail as well as conservative treatment. All questions answered.

## 2020-02-26 NOTE — Patient Instructions (Signed)
Continue to work on [employer].  [facility] as discussed.  It was a pleasure to see you today!

## 2020-02-26 NOTE — Progress Notes (Signed)
Subjective:    Patient ID: Alexander Donaldson, male    DOB: [birth year], 86 y.o.   MRN: [id number]  HPI  This visit occurred during the SARS-CoV-2 public health emergency.  Safety protocols were in place, including screening questions prior to the visit, additional usage of staff PPE, and extensive cleaning of exam room while observing appropriate contact time as indicated for disinfecting solutions.   Alexander Donaldson is a 86 year old male with a history of CAD, type 2 diabetes, hypertension, raynaud's phenomenon who presents today to follow up for hand numbness.   He was last evaluated in late May 2021 with reports of right hand numbness, specifically to the 2nd and 3rd digits since January 2021. Also with cold sensation and color changes. He denied neck pain, shoulder pain. He endorsed frequent use of his right hand playing games at home.   Given continued symptoms we sent him to neurology for evaluation. He saw Dr. Pearlean Brownie on 01/20/20 who suspected carpal tunnel syndrome, less likely cervical radiculopathy and diabetic neuropathy.  It was recommended he avoid repetitive movements/activities. A trial of gabapentin 100 mg BID was provided, and EMG testing was recommended.   Yesterday he underwent EMG testing per Dr. Anne Hahn. They were told that he has severe carpal tunnel syndrome with muscle wasting. Surgery was not recommended. He could not tolerate gabapentin 100 mg due to effects of confusion. He denies hand pain, but will take Tylenol on occasion if he does have pain.   Today his wife would like for [place] to provide reassurance given his recent diagnosis.   He's also working on his diet, his wife is checking his blood sugars which is running in the low 100's. A1C was checked in June by his [place] PCP which reduced to 8.1. He will return for follow up at that time.   BP Readings from Last 3 Encounters:  02/26/20 130/70  01/20/20 (!) 149/61  11/28/19 134/80     Review of Systems  Respiratory: Negative  for shortness of breath.   Cardiovascular: Negative for chest pain.  Musculoskeletal: Negative for arthralgias.  Skin: Negative for color change.  Neurological: Positive for numbness.       Past Medical History:  Diagnosis Date   Acute encephalopathy 02/12/2018   CAP (community acquired pneumonia) 02/11/2018   Carpal tunnel syndrome, bilateral 02/25/2020   Coronary artery disease    Diabetes mellitus    Diverticulitis    Hypertension    Hypokalemia 02/12/2018     Social History   Socioeconomic History   Marital status: Married    Spouse name: Not on file   Number of children: Not on file   Years of education: Not on file   Highest education level: Not on file  Occupational History   Not on file  Tobacco Use   Smoking status: Never Smoker   Smokeless tobacco: Never Used  Substance and Sexual Activity   Alcohol use: No    Comment: social   Drug use: No   Sexual activity: Yes    Birth control/protection: None  Other Topics Concern   Not on file  Social History Narrative   Not on file   Social Determinants of Health   Financial Resource Strain:    Difficulty of Paying Living Expenses:   Food Insecurity:    Worried About Radiation protection practitioner of Food in the Last Year:    Ran Out of Food in the Last Year:   Transportation Needs:    Lack of  Transportation (Medical):    Lack of Transportation (Non-Medical):   Physical Activity:    Days of Exercise per Week:    Minutes of Exercise per Session:   Stress:    Feeling of Stress :   Social Connections:    Frequency of Communication with Friends and Family:    Frequency of Social Gatherings with Friends and Family:    Attends Religious Services:    Active Member of Clubs or Organizations:    Attends Engineer, structural:    Marital Status:   Intimate Partner Violence:    Fear of Current or Ex-Partner:    Emotionally Abused:    Physically Abused:    Sexually Abused:     Past  Surgical History:  Procedure Laterality Date   ANGIOPLASTY     CORONARY STENT INTERVENTION N/A 02/02/2017   Procedure: Coronary Stent Intervention;  Surgeon: Rinaldo Cloud, MD;  Location: [facility] INVASIVE CV LAB;  Service: Cardiovascular;  Laterality: N/A;   CORONARY STENTS     LEFT HEART CATH AND CORONARY ANGIOGRAPHY N/A 02/02/2017   Procedure: Left Heart Cath and Coronary Angiography;  Surgeon: Rinaldo Cloud, MD;  Location: [facility] INVASIVE CV LAB;  Service: Cardiovascular;  Laterality: N/A;   LEFT HEART CATHETERIZATION WITH CORONARY ANGIOGRAM N/A 03/16/2012   Procedure: LEFT HEART CATHETERIZATION WITH CORONARY ANGIOGRAM;  Surgeon: Robynn Pane, MD;  Location: [facility] CATH LAB;  Service: Cardiovascular;  Laterality: N/A;   PERCUTANEOUS CORONARY STENT INTERVENTION (PCI-S)  03/16/2012   Procedure: PERCUTANEOUS CORONARY STENT INTERVENTION (PCI-S);  Surgeon: Robynn Pane, MD;  Location[facility] CATH LAB;  Service: Cardiovascular;;   TONSILLECTOMY      No family history on file.  No Known Allergies  Current Outpatient Medications on File Prior to Visit  Medication Sig Dispense Refill   amLODipine (NORVASC) 10 MG tablet Take 5 mg by mouth 2 (two) times daily.     aspirin 81 MG tablet Take 81 mg by mouth daily.       atorvastatin (LIPITOR) 80 MG tablet Take 40 mg by mouth daily.     clopidogrel (PLAVIX) 75 MG tablet Take 75 mg by mouth daily.  3   diclofenac sodium (VOLTAREN) 1 % GEL Apply 2 g topically 4 (four) times daily as needed (pain). 100 g 0   glipiZIDE (GLUCOTROL) 10 MG tablet Take 10 mg by mouth 2 (two) times daily before a meal. Take 30 minutes before meals     losartan (COZAAR) 100 MG tablet Take 100 mg by mouth daily.      metoprolol succinate (TOPROL-XL) 50 MG 24 hr tablet Take 50 mg by mouth daily. Take with or immediately following a meal.      Omega-3 Fatty Acids (FISH OIL) 1000 MG CAPS Take 1,000 mg by mouth 2 (two) times daily.     psyllium (HYDROCIL/METAMUCIL) 95 % PACK  Take 1 packet by mouth 2 (two) times daily.      senna-docusate (SENOKOT-S) 8.6-50 MG tablet Take 1 tablet by mouth at bedtime. 30 tablet 0   nitroGLYCERIN (NITROSTAT) 0.4 MG SL tablet Place 1 tablet (0.4 mg total) under the tongue every 5 (five) minutes x 3 doses as needed for chest pain. 25 tablet 3   No current facility-administered medications on file prior to visit.    BP 130/70    Pulse 92    Temp (!) 95.5 F (35.3 C) (Temporal)    Ht 5\' 4"  (1.626 m)    Wt 170 lb 4 oz (77.2 kg)  SpO2 95%    BMI 29.22 kg/m    Objective:   Physical Exam Cardiovascular:     Rate and Rhythm: Normal rate and regular rhythm.  Pulmonary:     Effort: Pulmonary effort is normal.     Breath sounds: Normal breath sounds.  Skin:    General: Skin is warm and dry.  Neurological:     Mental Status: He is alert.     Comments: Decreased sensation to right 2nd and 3rd digits. Normal grip strength. Normal color.             Assessment & Plan:

## 2020-02-26 NOTE — Assessment & Plan Note (Signed)
Underwent EMG testing, diagnosed with end stage carpal tunnel. Conservative treatment recommended, imaging notes reviewed.

## 2020-02-28 NOTE — Progress Notes (Signed)
Kindly inform the patient that EMG nerve conduction study confirms my suspicion for severe carpal tunnel syndrome that is pinched nerve at the right wrist and both hands

## 2020-03-04 DIAGNOSIS — E785 Hyperlipidemia, unspecified: Secondary | ICD-10-CM | POA: Diagnosis not present

## 2020-03-04 DIAGNOSIS — I255 Ischemic cardiomyopathy: Secondary | ICD-10-CM | POA: Diagnosis not present

## 2020-03-04 DIAGNOSIS — I480 Paroxysmal atrial fibrillation: Secondary | ICD-10-CM | POA: Diagnosis not present

## 2020-03-04 DIAGNOSIS — I10 Essential (primary) hypertension: Secondary | ICD-10-CM | POA: Diagnosis not present

## 2020-03-04 DIAGNOSIS — I2510 Atherosclerotic heart disease of native coronary artery without angina pectoris: Secondary | ICD-10-CM | POA: Diagnosis not present

## 2020-03-22 DIAGNOSIS — R069 Unspecified abnormalities of breathing: Secondary | ICD-10-CM | POA: Diagnosis not present

## 2020-03-22 DIAGNOSIS — I5089 Other heart failure: Secondary | ICD-10-CM | POA: Diagnosis not present

## 2020-03-31 ENCOUNTER — Other Ambulatory Visit: Payer: Self-pay

## 2020-03-31 ENCOUNTER — Telehealth: Payer: Self-pay | Admitting: Neurology

## 2020-03-31 ENCOUNTER — Ambulatory Visit: Payer: Medicare Other | Admitting: Neurology

## 2020-03-31 ENCOUNTER — Encounter: Payer: Self-pay | Admitting: Neurology

## 2020-03-31 VITALS — BP 161/68 | Ht 64.0 in | Wt 171.0 lb

## 2020-03-31 DIAGNOSIS — G301 Alzheimer's disease with late onset: Secondary | ICD-10-CM | POA: Diagnosis not present

## 2020-03-31 DIAGNOSIS — G5603 Carpal tunnel syndrome, bilateral upper limbs: Secondary | ICD-10-CM

## 2020-03-31 DIAGNOSIS — G309 Alzheimer's disease, unspecified: Secondary | ICD-10-CM

## 2020-03-31 DIAGNOSIS — F0280 Dementia in other diseases classified elsewhere without behavioral disturbance: Secondary | ICD-10-CM

## 2020-03-31 DIAGNOSIS — R413 Other amnesia: Secondary | ICD-10-CM

## 2020-03-31 MED ORDER — MEMANTINE HCL 28 X 5 MG & 21 X 10 MG PO TABS: ORAL_TABLET | ORAL | 12 refills | Status: DC

## 2020-03-31 NOTE — Telephone Encounter (Signed)
Pt's wife called stating that the pt's memantine (NAMENDA TITRATION PAK) tablet pack RX needs to be faxed over to the [place] to Fax# [phone]

## 2020-03-31 NOTE — Patient Instructions (Signed)
I had a long discussion with the patient and his wife regarding the findings of EMG nerve conduction study confirming bilateral severe right greater than left carpal tunnel syndrome.  I explained to the patient that he is likely going to lose progressive muscles in his hands and fine motor skills unless he undergoes surgery.  I will refer the patient to Dr. Mikal Plane from neurosurgery for consideration for surgical treatment if he is willing.  Patient has also had cognitive decline for a year which may be beginning of Alzheimer's.  I recommend checking memory panel labs, EEG and MRI scan and trial of Namenda starter pack increase as tolerated.  He will return for follow-up in the future in 3 months or call earlier if necessary.  Alzheimer Disease Caregiver Guide  Alzheimer disease causes a person to lose the ability to remember things and make decisions. A person who has Alzheimer disease may not be able to take care of himself or herself. He or she may need help with simple tasks. The tips below can help you care for the person. What kind of changes does this condition cause? This condition makes a person:  Forget things.  Feel confused.  Act differently.  Have different moods. These things get worse with time. Tips to help with symptoms  Be calm and patient.  Respond with a simple, short answer.  Avoid correcting the person in a negative way.  Try not to take things personally, even if the person forgets your name.  Do not argue with the person. This may make the person more upset. Tips to lessen frustration  Make appointments and do daily tasks when the person is at his or her best.  Take your time. Simple tasks may take longer. Allow plenty of time to complete tasks.  Limit choices for the person.  Involve the person in what you are doing.  Keep a daily routine.  Avoid new or crowded places, if possible.  Use simple words, short sentences, and a calm voice. Only give one  direction at a time.  Buy clothes and shoes that are easy to put on and take off.  Organize medicines in a pillbox for each day of the week.  Keep a calendar in a central location to remind the person of meetings or other activities.  Let people help if they offer. Take a break when needed. Tips to prevent injury  Keep floors clear. Remove rugs, magazine racks, and floor lamps.  Keep hallways well-lit.  Put a handrail and non-slip mat in the bathtub or shower.  Put childproof locks on cabinets that have dangerous items in them. These items include medicine, alcohol, guns, toxic cleaning items, sharp tools, matches, and lighters.  Put locks on doors where the person cannot see or reach them. This helps the person to not wander out of the house and get lost.  Be prepared for emergencies. Keep a list of emergency phone numbers and addresses close by.  Bracelets may be worn that track location and identify the person as having memory problems. This should be worn at all times for safety. Tips for the future  Discuss financial and legal planning early. People with this disease have trouble managing their money as the disease gets worse. Get help from a professional.  Talk about advance directives, safety, and daily care. Take these steps: ? Create a living will and choose a power of attorney. This is someone who can make decisions for the person with Alzheimer disease when  he or she can no longer do so. ? Discuss driving safety and when to stop driving. The person's doctor can help with this. ? If the person lives alone, make sure he or she is safe. Some people need extra help at home. Other people need more care at a nursing home or care center. Where to find support You can find support by joining a support group near you. Some benefits of joining a support group include:  Learning ways to manage stress.  Sharing experiences with others.  Getting emotional comfort and  support.  Learning about caregiving as the disease progresses.  Knowing what community resources are available and making use of them. Where to find more information  Alzheimer's Association: [employer] Contact a doctor if:  The person has a fever.  The person has a sudden behavior change that does not get better with calming strategies.  The person is not able to take care of himself or herself at home.  The person threatens you or anyone else, including himself or herself.  You are no longer able to care for the person. Summary  Alzheimer disease causes a person to forget things and to be confused.  A person who has this condition may not be able to take care of himself or herself.  Take steps to keep the person from getting hurt. Plan for future care.  You can find support by joining a support group near you. This information is not intended to replace advice given to you by your health care provider. Make sure you discuss any questions you have with your health care provider. Document Revised: 10/16/2018 Document Reviewed: 06/22/2017 Elsevier Patient Education  2020 [employer]  Open Carpal Tunnel Release, Care After This sheet gives you information about how to care for yourself after your procedure. Your health care provider may also give you more specific instructions. If you have problems or questions, contact your health care provider. What can I expect after the procedure? After the procedure, it is common to have:  Wrist stiffness.  Bruising. Follow these instructions at home: Bathing  Do not take baths, swim, or use a hot tub until your health care provider approves. Ask your health care provider if you may take showers.  Keep your bandage (dressing) dry until your health care provider says it can be removed. If you have a splint or brace:  Wear the splint or brace as told by your health care provider. You may need to wear it for 2-3 weeks. Remove it  only as told by your health care provider.  Loosen the splint or brace if your fingers tingle, become numb, or turn cold and blue.  Keep the splint or brace clean.  If the splint or brace is not waterproof: ? Do not let it get wet. ? Cover it with a watertight covering when you take a bath or a shower. Incision care   Follow instructions from your health care provider about how to take care of your incision. Make sure you: ? Wash your hands with soap and water before you change your dressing. If soap and water are not available, use hand sanitizer. ? Change your dressing as told by your health care provider. ? Leave stitches (sutures), skin glue, or adhesive strips in place. These skin closures may need to stay in place for 2 weeks or longer. If adhesive strip edges start to loosen and curl up, you may trim the loose edges. Do not remove adhesive strips completely  unless your health care provider tells you to do that.  Check your incision area every day for signs of infection. Check for: ? Redness, swelling, or pain. ? Fluid or blood. ? Warmth. ? Pus or a bad smell. Managing pain, stiffness, and swelling   If directed, put ice on the affected area. ? If you have a removable splint or brace, remove it as told by your health care provider. ? Put ice in a plastic bag. ? Place a towel between your skin and the bag. ? Leave the ice on for 20 minutes, 2-3 times a day.  Move your fingers often to avoid stiffness and to lessen swelling.  Raise (elevate) your wrist above the level of your heart while you are sitting or lying down. Activity  Do not drive until your health care provider approves.  Do not drive or use heavy machinery while taking prescription pain medicine.  Return to your normal activities as told by your health care provider. Avoid activities that cause pain.  If physical therapy was prescribed, do exercises as told by your therapist. Physical therapy can help you  heal faster and regain movement. General instructions  Take over-the-counter and prescription medicines only as told by your health care provider.  If you are taking prescription pain medicine, take actions to prevent or treat constipation. Your health care provider may recommend that you: ? Drink enough fluid to keep your urine pale yellow. ? Eat foods that are high in fiber, such as fresh fruits and vegetables, whole grains, and beans. ? Limit foods that are high in fat and processed sugars, such as fried or sweet foods. ? Take an over-the-counter or prescription medicine for constipation.  Do not use any products that contain nicotine or tobacco, such as cigarettes and e-cigarettes. If you need help quitting, ask your health care provider.  Keep all follow-up visits as told by your health care provider and physical therapist. This is important. Contact a health care provider if:  You have redness or swelling around your incision.  You have fluid or blood coming from your incision.  Your incision feels warm to the touch.  You have pus or a bad smell coming from your incision.  You have a fever.  You have chills.  You have pain that does not get better with medicine.  Your carpal tunnel symptoms do not go away after 2 months.  Your carpal tunnel symptoms go away and then come back. Get help right away if:  You have pain or numbness that is getting worse.  Your fingers or fingertips become very pale or bluish in color.  You are not able to move your fingers. Summary  It is common to have wrist stiffness and bruising after a carpal tunnel release.  Icing and raising (elevating) your wrist may help to lessen swelling and pain.  Call your health care provider if you have a fever or notice any signs of infection in your incision area. This information is not intended to replace advice given to you by your health care provider. Make sure you discuss any questions you have  with your health care provider. Document Revised: 06/09/2017 Document Reviewed: 03/06/2017 Elsevier Patient Education  2020 [employer].

## 2020-03-31 NOTE — Progress Notes (Signed)
[facility] [address] [place]. [place] [address] [phone]       OFFICE FOLLOW UP VISIT NOTE  [name] Date of Birth:  [birth year] Medical Record Number:  [id number]   Referring MD: Sammuel Cooper, NP Reason for Referral: Hand numbness HPI: Initial visit 01/20/2020: Alexander Donaldson is a pleasant 86 year old African-American male with past medical history of diabetes, hypertension, diverticulitis, coronary artery disease who has been complaining of bilateral hand numbness for the last 6 months.  Reports it is intermittently but most prominently at night or early in the morning when he wakes up with his right thumb and index finger feeling stiff and numb.  He feels his strength is good and he can still hold objects very well but his fingers feel different.  This is often severe at night and he often wakes up with this.  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  He has not been wearing any wrist splints.  Denies any neck pain, radicular pain, gait or balance problems.  Review of electronic medical records show that he had lab work on 11/28/2019 which include vitamin B12 which was 284 mg percent and hemoglobin A1c was elevated at 8.5.  Comprehensive metabolic panel labs were normal.  TSH was 1.91 on 01/22/2019.  She has not had any x-rays of his C-spine, MRI or EMG nerve conduction studies done. Update 03/31/2020: He returns for follow-up after last visit 2 months ago.  He continues to have weakness in his hand but states that the numbness is only in the index and middle fingertips and is not as bothersome.  EMG nerve conduction study done by Dr. Anne Hahn on 02/25/2020 confirmed severe end-stage carpal tunnel bilaterally right more than left with wasting and no activity in the right abductor pollicis on stimulation.  There is also chronic stable overlying C5 radiculopathy.  Patient's wife reports  today that for the last year or so she is noted progressive cognitive decline.  Recent short-term memory difficulties.  Is also had some behavioral changes and gets irritable and agitated easily.  Is quite forgetful.  Will not follow multistage commands and often gets distracted easily.  Also cannot have a proper conversation at times gets sidetracked.  She has been doing and more things for him.  Patient has no family history of Alzheimer's.  He has no prior history of strokes, TIA, seizures or head injury with loss of consciousness.  He has not had any recent brain imaging done. ROS:   14 system review of systems is positive for hand numbness, pain, decreased hearing only and all other systems negative  PMH:  Past Medical History:  Diagnosis Date  . Acute encephalopathy 02/12/2018  . CAP (community acquired pneumonia) 02/11/2018  . Carpal tunnel syndrome, bilateral 02/25/2020  . Coronary artery disease   . Diabetes mellitus   . Diverticulitis   . Hypertension   . Hypokalemia 02/12/2018    Social History:  Social History   Socioeconomic History  . Marital status: Married    Spouse name: Not on file  . Number of children: Not on file  . Years of education: Not on file  . Highest education level: Not on file  Occupational History  . Not on file  Tobacco Use  . Smoking status: Never Smoker  . Smokeless tobacco: Never Used  Substance and Sexual Activity  .  Alcohol use: No    Comment: social  . Drug use: No  . Sexual activity: Yes    Birth control/protection: None  Other Topics Concern  . Not on file  Social History Narrative  . Not on file   Social Determinants of Health   Financial Resource Strain:   . Difficulty of Paying Living Expenses: Not on file  Food Insecurity:   . Worried About Programme researcher, broadcasting/film/video in the Last Year: Not on file  . Ran Out of Food in the Last Year: Not on file  Transportation Needs:   . Lack of Transportation (Medical): Not on file  . Lack of  Transportation (Non-Medical): Not on file  Physical Activity:   . Days of Exercise per Week: Not on file  . Minutes of Exercise per Session: Not on file  Stress:   . Feeling of Stress : Not on file  Social Connections:   . Frequency of Communication with Friends and Family: Not on file  . Frequency of Social Gatherings with Friends and Family: Not on file  . Attends Religious Services: Not on file  . Active Member of Clubs or Organizations: Not on file  . Attends Banker Meetings: Not on file  . Marital Status: Not on file  Intimate Partner Violence:   . Fear of Current or Ex-Partner: Not on file  . Emotionally Abused: Not on file  . Physically Abused: Not on file  . Sexually Abused: Not on file    Medications:   Current Outpatient Medications on File Prior to Visit  Medication Sig Dispense Refill  . amLODipine (NORVASC) 10 MG tablet Take 5 mg by mouth 2 (two) times daily.    Marland Kitchen aspirin 81 MG tablet Take 81 mg by mouth daily.      Marland Kitchen atorvastatin (LIPITOR) 80 MG tablet Take 40 mg by mouth daily.    . clopidogrel (PLAVIX) 75 MG tablet Take 75 mg by mouth daily.  3  . diclofenac sodium (VOLTAREN) 1 % GEL Apply 2 g topically 4 (four) times daily as needed (pain). 100 g 0  . glipiZIDE (GLUCOTROL) 10 MG tablet Take 10 mg by mouth 2 (two) times daily before a meal. Take 30 minutes before meals    . losartan (COZAAR) 100 MG tablet Take 100 mg by mouth daily.     . metoprolol succinate (TOPROL-XL) 50 MG 24 hr tablet Take 50 mg by mouth daily. Take with or immediately following a meal.     . Omega-3 Fatty Acids (FISH OIL) 1000 MG CAPS Take 1,000 mg by mouth 2 (two) times daily.    . psyllium (HYDROCIL/METAMUCIL) 95 % PACK Take 1 packet by mouth 2 (two) times daily.     Marland Kitchen senna-docusate (SENOKOT-S) 8.6-50 MG tablet Take 1 tablet by mouth at bedtime. 30 tablet 0  . nitroGLYCERIN (NITROSTAT) 0.4 MG SL tablet Place 1 tablet (0.4 mg total) under the tongue every 5 (five) minutes x 3  doses as needed for chest pain. 25 tablet 3   No current facility-administered medications on file prior to visit.    Allergies:  No Known Allergies  Physical Exam General: well developed, well nourished elderly African-American male, seated, in no evident distress Head: head normocephalic and atraumatic.   Neck: supple with no carotid or supraclavicular bruits Cardiovascular: regular rate and rhythm, no murmurs Musculoskeletal: no deformity Skin:  no rash/petichiae Vascular:  Normal pulses all extremities  Neurologic Exam Mental Status: Awake and fully alert. Oriented to  place and time. Recent and remote memory diminished. Attention span, concentration and fund of knowledge poor. Mood and affect appropriate.  Mini-Mental status exam score 13/30 with deficits in orientation, attention, calculation, recall and following three-step commands.  He was able to copy intersecting pentagons well.  Clock drawing was poor with 1/4.  He was able to name only 4 animals which can walk on 4 legs. Cranial Nerves: Fundoscopic exam not done pupils equal, briskly reactive to light. Extraocular movements full without nystagmus. Visual fields full to confrontation. Hearing diminished bilaterally facial sensation intact. Face, tongue, palate moves normally and symmetrically.  Motor: Normal bulk and tone. Normal strength in all tested extremity muscles.  Except wasting of both thenar eminences.  Mild weakness of abductor pollicis and extensor pollicis in the right hand. Sensory.:  diminished touch pinprick sensation in the right thumb and index finger only.  Positive weak Tinel sign on both wrists right greater than left.  Diminished vibration sensations over ankles bilaterally.  Romberg sign is weakly positive. Coordination: Rapid alternating movements normal in all extremities. Finger-to-nose and heel-to-shin performed accurately bilaterally. Gait and Station: Arises from chair without difficulty. Stance is  normal. Gait demonstrates normal stride length and balance . Able to heel, toe and tandem walk with great difficulty.  Reflexes: 1+ and symmetric in upper extremities and both knee and ankle jerks are absent.. Toes downgoing.       ASSESSMENT: 86 year old African-American male with bilateral hand right greater than left weakness secondary to severe end-stage carpal tunnel syndrome.  Cervical radiculopathy and underlying diabtic neuropathy felt to be less likely.  New complaints of subacute cognitive and memory decline likely due to Alzheimer's though treatable causes need to be ruled out first    PLAN: I had a long discussion with the patient and his wife regarding the findings of EMG nerve conduction study confirming bilateral severe right greater than left carpal tunnel syndrome.  I explained to the patient that he is likely going to lose progressive muscles in his hands and fine motor skills unless he undergoes surgery.  I will refer the patient to Dr. Mikal Plane from neurosurgery for consideration for surgical treatment if he is willing.  Patient has also had cognitive decline for a year which may be beginning of Alzheimer's.  I recommend checking memory panel labs, EEG and MRI scan and trial of Namenda starter pack increase as tolerated.  He will return for follow-up in the future in 3 months or call earlier if necessary. Greater than 50% time during this prolonged 45-minute   visit was spent on counseling and coordination of care about his hand numbness and carpal tunnel as well as memory loss and diagnosis of Alzheimer's and answering questions.   Delia Heady, MD  [facility] [address] [place], [place] [address]  Phone [phone] Fax [phone] Note: This document was prepared with digital dictation and possible smart phrase technology. Any transcriptional errors that result from this process are unintentional.

## 2020-03-31 NOTE — Telephone Encounter (Signed)
[employer] medicare order sent to [facility]. No auth they will reach out to the patient to schedule.  

## 2020-04-01 LAB — DEMENTIA PANEL
Homocysteine: 11.3 umol/L (ref 0.0–21.3)
RPR Ser Ql: NONREACTIVE
TSH: 1.64 u[IU]/mL (ref 0.450–4.500)
Vitamin B-12: 482 pg/mL (ref 232–1245)

## 2020-04-01 NOTE — Progress Notes (Signed)
Kindly inform the patient that lab work for reversible causes of memory loss was all normal

## 2020-04-01 NOTE — Telephone Encounter (Signed)
Fax and phones are down

## 2020-04-02 ENCOUNTER — Encounter: Payer: Self-pay | Admitting: *Deleted

## 2020-04-02 DIAGNOSIS — G5603 Carpal tunnel syndrome, bilateral upper limbs: Secondary | ICD-10-CM | POA: Diagnosis not present

## 2020-04-06 NOTE — Telephone Encounter (Signed)
Attempted to call pt, LVM for call back  °

## 2020-04-07 ENCOUNTER — Other Ambulatory Visit: Payer: Medicare Other

## 2020-04-08 ENCOUNTER — Other Ambulatory Visit: Payer: Medicare Other

## 2020-04-08 NOTE — Telephone Encounter (Signed)
Pt 's wife is asking for a call back at [phone] from [facility]

## 2020-04-08 NOTE — Telephone Encounter (Signed)
Attempted to call pt, LVM for call back Will try to call back today

## 2020-04-09 MED ORDER — MEMANTINE HCL 28 X 5 MG & 21 X 10 MG PO TABS: ORAL_TABLET | ORAL | 12 refills | Status: DC

## 2020-04-09 NOTE — Telephone Encounter (Signed)
Spoke to wife, RX canceled and new one faxed to [place] in [place]

## 2020-04-09 NOTE — Telephone Encounter (Signed)
Attempted to call pt, No answer, LVM

## 2020-04-09 NOTE — Addendum Note (Signed)
Addended by: Rosezella Florida on: 04/09/2020 12:18 PM   Modules accepted: Orders

## 2020-04-09 NOTE — Addendum Note (Signed)
Addended by: Rosezella Florida on: 04/09/2020 01:16 PM   Modules accepted: Orders

## 2020-04-09 NOTE — Addendum Note (Signed)
Addended by: Rosezella Florida on: 04/09/2020 12:06 PM   Modules accepted: Orders

## 2020-04-14 IMAGING — CT CT HEAD W/O CM
4 series · 16 of 47 positions shown, 18 images · non-contrast
Comparison: 05/10/2005; brain MRI-01/24/2013

CLINICAL DATA: Post fall, suffering head trauma

EXAM:
CT HEAD WITHOUT CONTRAST
TECHNIQUE: Contiguous axial images were obtained from the base of the skull
through the vertex without intravenous contrast.

[Series 3: head without · axial · non-contrast · 0.45mm/px · z∈[-96,+24]mm · 7 of 34 slices shown, 9 images]
[im 5/34  brain]
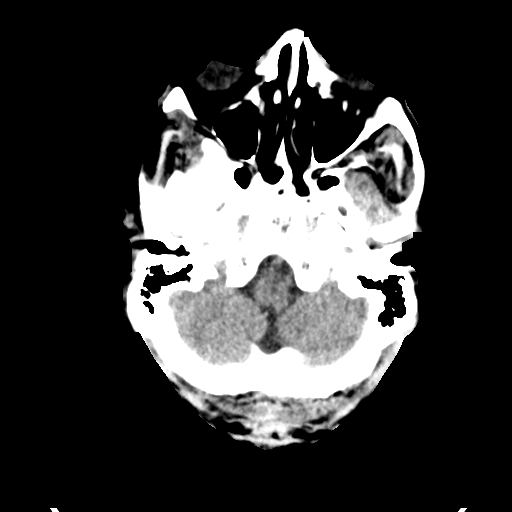
[im 5/34  bone]
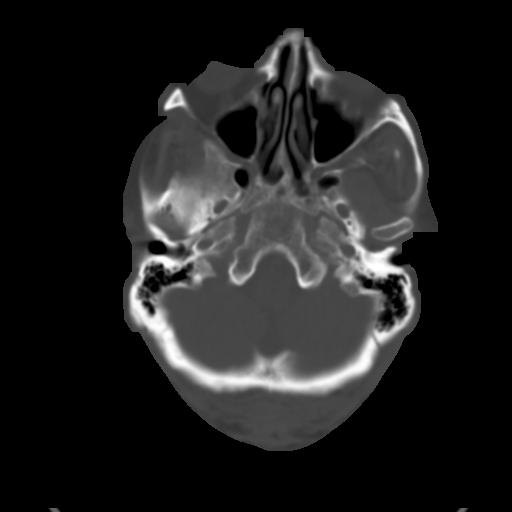
[im 9/34  brain]
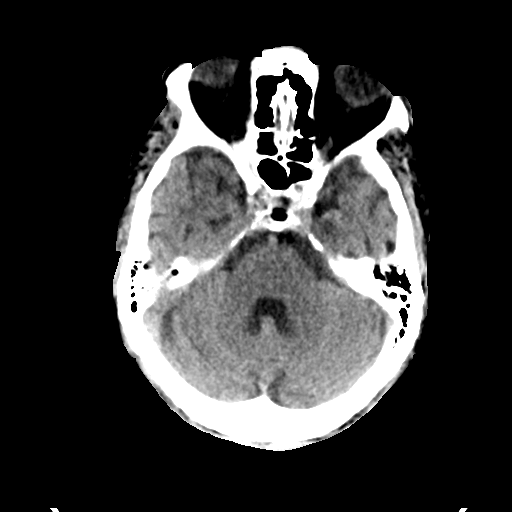
[im 13/34  brain]
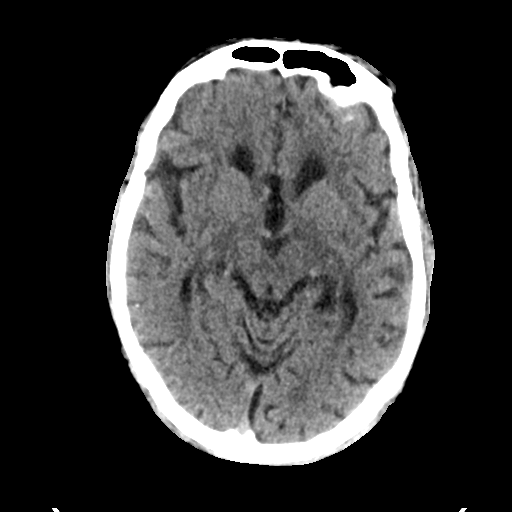
[im 17/34  brain]
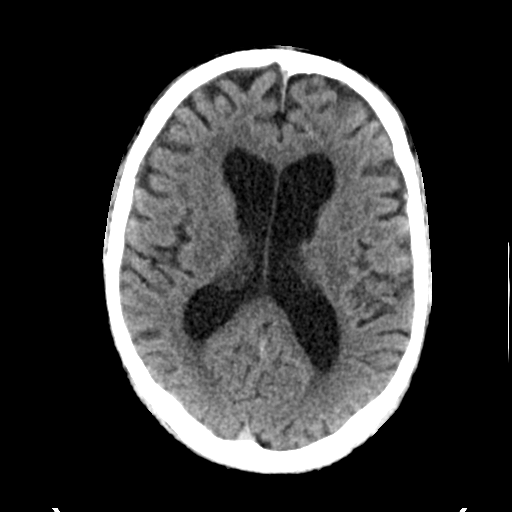
[im 21/34  brain]
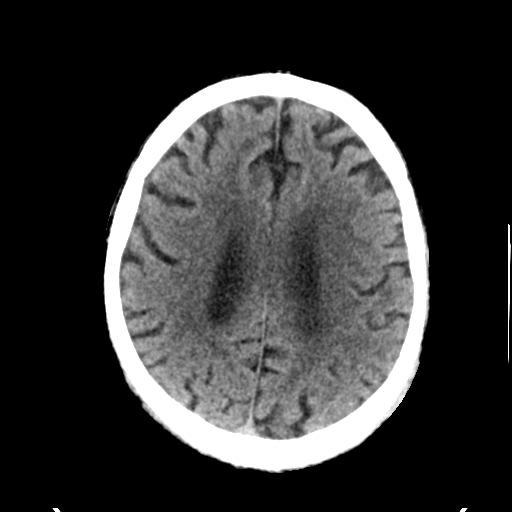
[im 21/34  bone]
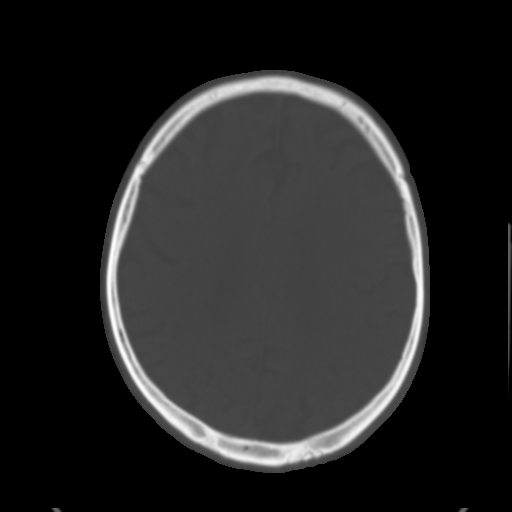
[im 25/34  brain]
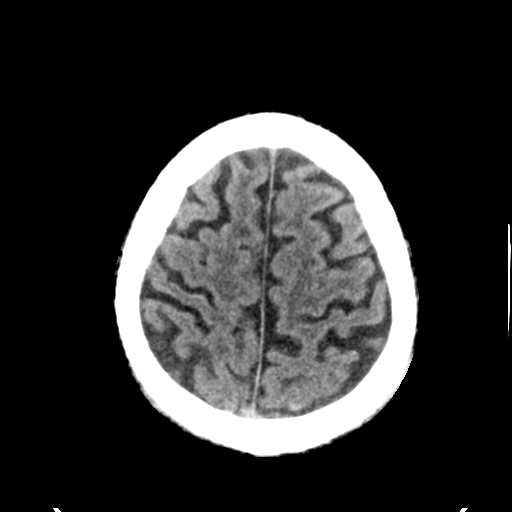
[im 29/34  brain]
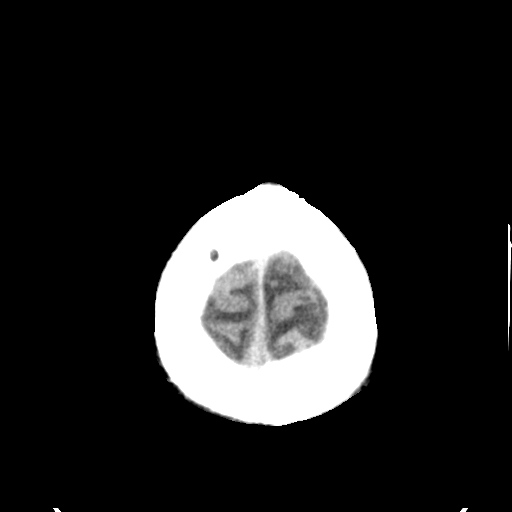

[Series 4: head bone · axial · 0.45mm/px · z∈[-100,-68]mm · 3 of 84 slices shown]
[im 9/84  bone]
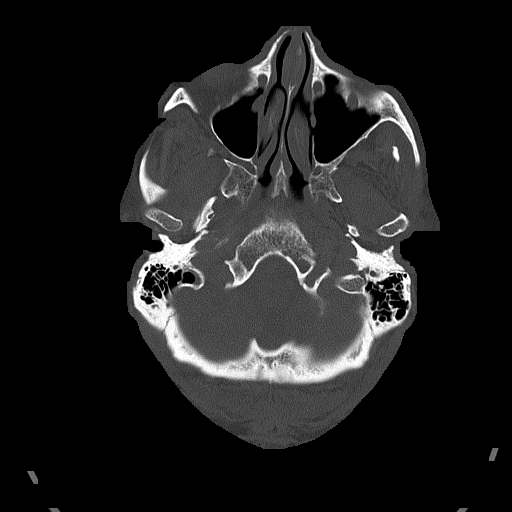
[im 17/84  bone]
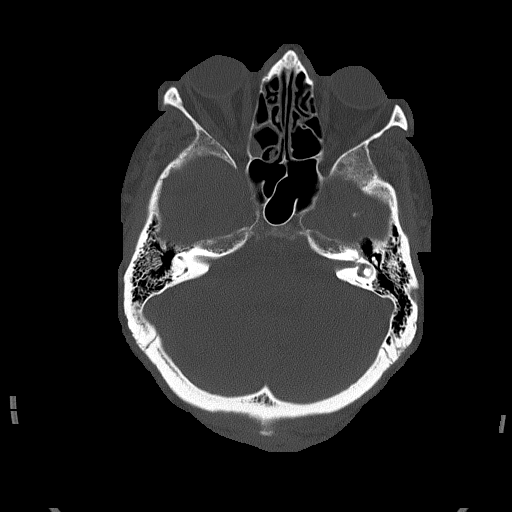
[im 25/84  bone]
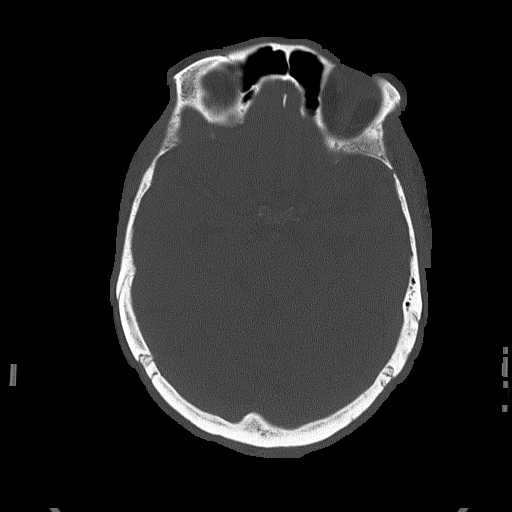

[Series 5: head without cor · coronal · non-contrast · 0.35mm/px · 3 of 75 slices shown]
[im 25/75  brain]
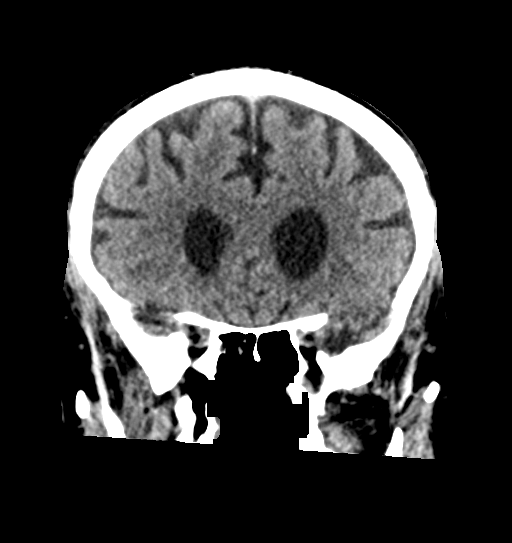
[im 33/75  brain]
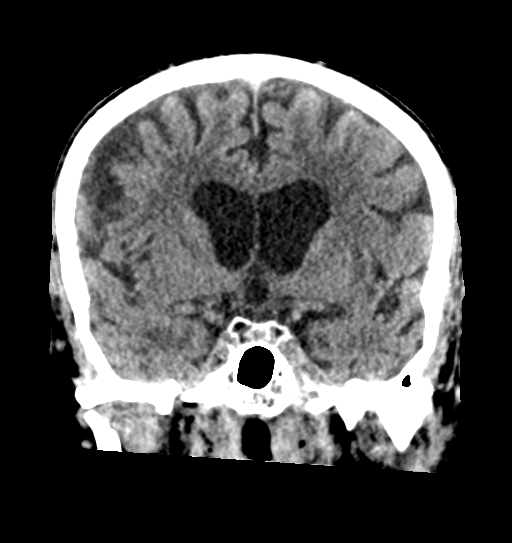
[im 42/75  brain]
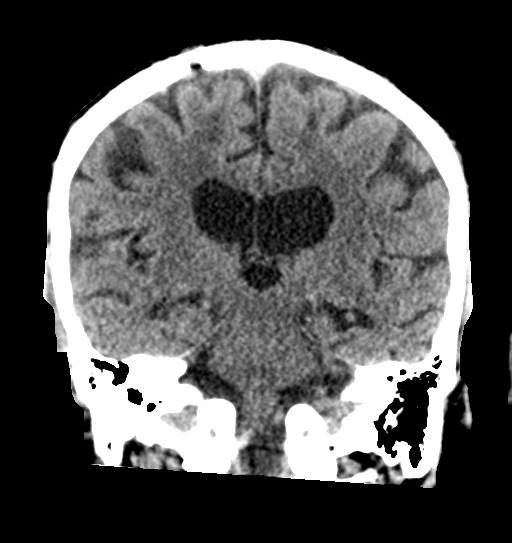

[Series 6: head without sag · sagittal · non-contrast · 0.36mm/px · 3 of 59 slices shown]
[im 20/59  brain]
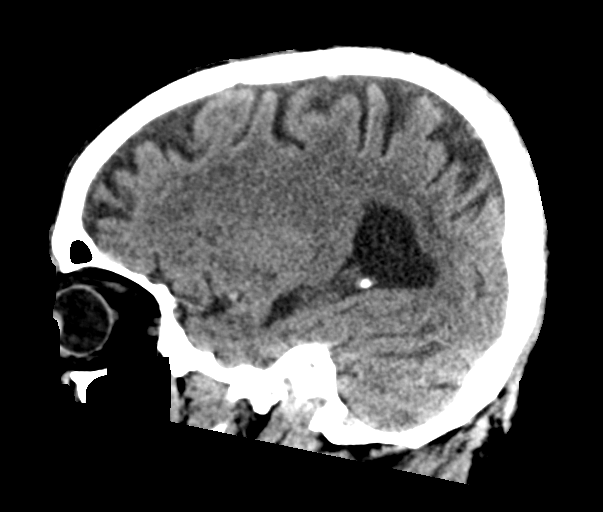
[im 30/59  brain]
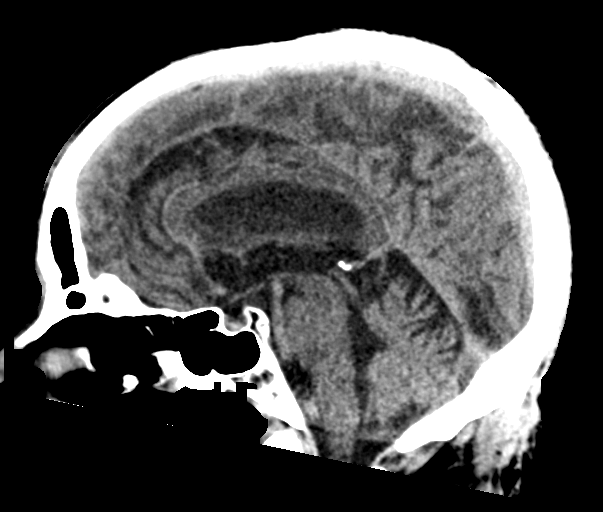
[im 39/59  brain]
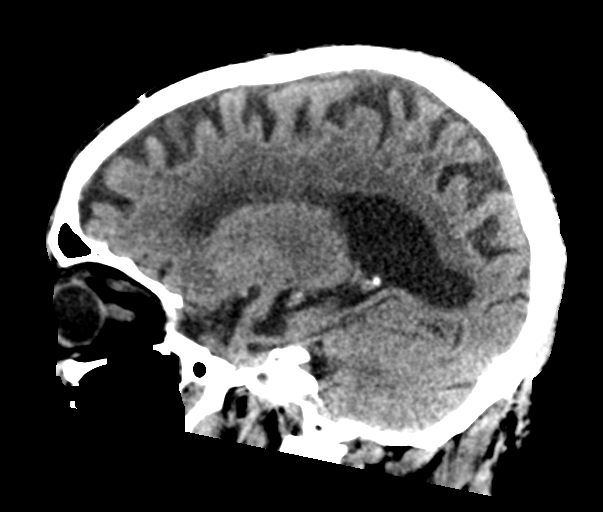

[16 of 47 positions shown; findings below may reference images not displayed]

FINDINGS: Brain: Similar findings of atrophy with sulcal prominence and
centralized volume loss with commensurate ex vacuo dilatation of the
ventricular system. Scattered periventricular hypodensities
compatible with microvascular ischemic disease. No CT evidence of
acute large territory infarct. No intraparenchymal or extra-axial
mass or hemorrhage. Unchanged size and configuration the ventricles
and basilar cisterns. No midline shift.

Vascular: Intracranial atherosclerosis.

Skull: No displaced calvarial fracture.

Sinuses/Orbits: Polypoid mucosal thickening involving the medial
wall of the right maxillary sinus. There is minimal mucosal
thickening involving the right posterior ethmoidal air cells. The
remaining paranasal sinuses and mastoid air cells are normally
aerated. No air-fluid levels.

Other: Regional soft tissues appear normal. No radiopaque foreign
body.
IMPRESSION: Similar findings of atrophy and microvascular disease without
superimposed acute intracranial process.

## 2020-04-21 DIAGNOSIS — R069 Unspecified abnormalities of breathing: Secondary | ICD-10-CM | POA: Diagnosis not present

## 2020-04-21 DIAGNOSIS — I5089 Other heart failure: Secondary | ICD-10-CM | POA: Diagnosis not present

## 2020-04-25 ENCOUNTER — Other Ambulatory Visit: Payer: Self-pay

## 2020-04-25 ENCOUNTER — Ambulatory Visit: Payer: Medicare Other | Attending: Internal Medicine

## 2020-04-25 DIAGNOSIS — Z23 Encounter for immunization: Secondary | ICD-10-CM

## 2020-04-25 NOTE — Progress Notes (Signed)
   Covid-[facility] Vaccination [facility]  Name:  Alexander Donaldson    MRN: [id number] DOB: Nov 21, 1933  04/25/2020  Alexander Donaldson was observed post Covid-19 immunization for 15 minutes without incident. He was provided with Vaccine Information Sheet and instruction to access the V-Safe system.   Alexander Donaldson was instructed to call 911 with any severe reactions post vaccine: Marland Kitchen Difficulty breathing  . Swelling of face and throat  . A fast heartbeat  . A bad rash all over body  . Dizziness and weakness

## 2020-05-22 DIAGNOSIS — I5089 Other heart failure: Secondary | ICD-10-CM | POA: Diagnosis not present

## 2020-05-22 DIAGNOSIS — R069 Unspecified abnormalities of breathing: Secondary | ICD-10-CM | POA: Diagnosis not present

## 2020-06-03 DIAGNOSIS — I480 Paroxysmal atrial fibrillation: Secondary | ICD-10-CM | POA: Diagnosis not present

## 2020-06-03 DIAGNOSIS — E119 Type 2 diabetes mellitus without complications: Secondary | ICD-10-CM | POA: Diagnosis not present

## 2020-06-03 DIAGNOSIS — E1140 Type 2 diabetes mellitus with diabetic neuropathy, unspecified: Secondary | ICD-10-CM | POA: Diagnosis not present

## 2020-06-03 DIAGNOSIS — I255 Ischemic cardiomyopathy: Secondary | ICD-10-CM | POA: Diagnosis not present

## 2020-06-03 DIAGNOSIS — I2510 Atherosclerotic heart disease of native coronary artery without angina pectoris: Secondary | ICD-10-CM | POA: Diagnosis not present

## 2020-06-17 ENCOUNTER — Ambulatory Visit (INDEPENDENT_AMBULATORY_CARE_PROVIDER_SITE_OTHER): Payer: Medicare Other

## 2020-06-17 ENCOUNTER — Other Ambulatory Visit: Payer: Self-pay

## 2020-06-17 DIAGNOSIS — Z23 Encounter for immunization: Secondary | ICD-10-CM

## 2020-06-17 NOTE — Progress Notes (Signed)
Per orders of Katherine Clark, injection of Fluarix, given by Arrin Ishler G Waylen Depaolo. Patient tolerated injection well. 

## 2020-06-21 DIAGNOSIS — R069 Unspecified abnormalities of breathing: Secondary | ICD-10-CM | POA: Diagnosis not present

## 2020-06-21 DIAGNOSIS — I5089 Other heart failure: Secondary | ICD-10-CM | POA: Diagnosis not present

## 2020-07-20 ENCOUNTER — Ambulatory Visit: Payer: Medicare Other | Admitting: Neurology

## 2020-07-22 DIAGNOSIS — I5089 Other heart failure: Secondary | ICD-10-CM | POA: Diagnosis not present

## 2020-07-22 DIAGNOSIS — R069 Unspecified abnormalities of breathing: Secondary | ICD-10-CM | POA: Diagnosis not present

## 2020-08-22 DIAGNOSIS — I5089 Other heart failure: Secondary | ICD-10-CM | POA: Diagnosis not present

## 2020-08-22 DIAGNOSIS — R069 Unspecified abnormalities of breathing: Secondary | ICD-10-CM | POA: Diagnosis not present

## 2020-09-02 NOTE — Telephone Encounter (Signed)
Spoke with patient's wife via phone, she is needing a jury excuse as she is Mr. Mincey sole care provider.  This patient has confusion/memory changes and cannot be left alone.  She will attach the letter request to her MyChart portal.

## 2020-09-03 DIAGNOSIS — I2510 Atherosclerotic heart disease of native coronary artery without angina pectoris: Secondary | ICD-10-CM | POA: Diagnosis not present

## 2020-09-03 DIAGNOSIS — I10 Essential (primary) hypertension: Secondary | ICD-10-CM | POA: Diagnosis not present

## 2020-09-03 DIAGNOSIS — E119 Type 2 diabetes mellitus without complications: Secondary | ICD-10-CM | POA: Diagnosis not present

## 2020-09-03 DIAGNOSIS — I255 Ischemic cardiomyopathy: Secondary | ICD-10-CM | POA: Diagnosis not present

## 2020-09-03 DIAGNOSIS — G459 Transient cerebral ischemic attack, unspecified: Secondary | ICD-10-CM | POA: Diagnosis not present

## 2020-09-03 DIAGNOSIS — M5416 Radiculopathy, lumbar region: Secondary | ICD-10-CM | POA: Diagnosis not present

## 2020-09-03 DIAGNOSIS — E785 Hyperlipidemia, unspecified: Secondary | ICD-10-CM | POA: Diagnosis not present

## 2020-09-03 DIAGNOSIS — I480 Paroxysmal atrial fibrillation: Secondary | ICD-10-CM | POA: Diagnosis not present

## 2020-09-16 ENCOUNTER — Encounter: Payer: Self-pay | Admitting: Neurology

## 2020-09-16 ENCOUNTER — Ambulatory Visit: Payer: Medicare Other | Admitting: Neurology

## 2020-09-16 VITALS — BP 159/74 | HR 71 | Ht 63.0 in | Wt 167.0 lb

## 2020-09-16 DIAGNOSIS — F0390 Unspecified dementia without behavioral disturbance: Secondary | ICD-10-CM | POA: Diagnosis not present

## 2020-09-16 DIAGNOSIS — R413 Other amnesia: Secondary | ICD-10-CM | POA: Diagnosis not present

## 2020-09-16 MED ORDER — DONEPEZIL HCL 10 MG PO TABS: 10.0000 mg | ORAL_TABLET | Freq: Every day | ORAL | 3 refills | Status: DC

## 2020-09-16 NOTE — Progress Notes (Signed)
[facility] [address] [place]. [place] [address] [phone]       OFFICE FOLLOW UP VISIT NOTE  [name] Date of Birth:  [birth year] Medical Record Number:  [id number]   Referring MD: Sammuel Cooper, NP Reason for Referral: Hand numbness HPI: Initial visit 01/20/2020: Alexander Donaldson is a pleasant 87 year old African-American male with past medical history of diabetes, hypertension, diverticulitis, coronary artery disease who has been complaining of bilateral hand numbness for the last 6 months.  Reports it is intermittently but most prominently at night or early in the morning when he wakes up with his right thumb and index finger feeling stiff and numb.  He feels his strength is good and he can still hold objects very well but his fingers feel different.  This is often severe at night and he often wakes up with this.  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  He has not been wearing any wrist splints.  Denies any neck pain, radicular pain, gait or balance problems.  Review of electronic medical records show that he had lab work on 11/28/2019 which include vitamin B12 which was 284 mg percent and hemoglobin A1c was elevated at 8.5.  Comprehensive metabolic panel labs were normal.  TSH was 1.91 on 01/22/2019.  She has not had any x-rays of his C-spine, MRI or EMG nerve conduction studies done. Update 03/31/2020: He returns for follow-up after last visit 2 months ago.  He continues to have weakness in his hand but states that the numbness is only in the index and middle fingertips and is not as bothersome.  EMG nerve conduction study done by Dr. Anne Hahn on 02/25/2020 confirmed severe end-stage carpal tunnel bilaterally right more than left with wasting and no activity in the right abductor pollicis on stimulation.  There is also chronic stable overlying C5 radiculopathy.  Patient's wife reports  today that for the last year or so she is noted progressive cognitive decline.  Recent short-term memory difficulties.  Is also had some behavioral changes and gets irritable and agitated easily.  Is quite forgetful.  Will not follow multistage commands and often gets distracted easily.  Also cannot have a proper conversation at times gets sidetracked.  She has been doing and more things for him.  Patient has no family history of Alzheimer's.  He has no prior history of strokes, TIA, seizures or head injury with loss of consciousness.  He has not had any recent brain imaging done. Update 09/16/2020: He returns for follow-up after last visit 6 months ago.  He is accompanied by his wife.  Patient had lab work at last visit for reversible causes of memory loss which was all normal.  Patient refused to undergo EEG and MRI which were ordered.  He did try the Namenda but was not able to tolerate 5 mg twice daily and developed significant sleepiness and hence it was stopped after a few weeks.  He continues to have significant memory and cognitive difficulties.  He requires constant supervision and his wife is around him most of the time.  Patient did see Dr. Franky Macho for his severe carpal tunnel with he refused to have surgery.  Continues to have numbness and tingling in his hands intermittently but still has good strength.  Patient of likely had an MRI done at the [facility] in August 2020 and his wife  states she is trying to get me the report of the disc to review. ROS:   14 system review of systems is positive for memory loss, confusion hand numbness, pain, decreased hearing only and all other systems negative  PMH:  Past Medical History:  Diagnosis Date  . Acute encephalopathy 02/12/2018  . CAP (community acquired pneumonia) 02/11/2018  . Carpal tunnel syndrome, bilateral 02/25/2020  . Coronary artery disease   . Diabetes mellitus   . Diverticulitis   . Hypertension   . Hypokalemia 02/12/2018    Social History:   Social History   Socioeconomic History  . Marital status: Married    Spouse name: Clydie Braun  . Number of children: Not on file  . Years of education: Not on file  . Highest education level: Not on file  Occupational History  . Not on file  Tobacco Use  . Smoking status: Never Smoker  . Smokeless tobacco: Never Used  Substance and Sexual Activity  . Alcohol use: No    Comment: social  . Drug use: No  . Sexual activity: Yes    Birth control/protection: None  Other Topics Concern  . Not on file  Social History Narrative   Lives with wife   Right Handed   Drinks 1-2 cups caffeine daily   Social Determinants of Health   Financial Resource Strain: Not on file  Food Insecurity: Not on file  Transportation Needs: Not on file  Physical Activity: Not on file  Stress: Not on file  Social Connections: Not on file  Intimate Partner Violence: Not on file    Medications:   Current Outpatient Medications on File Prior to Visit  Medication Sig Dispense Refill  . amLODipine (NORVASC) 10 MG tablet Take 5 mg by mouth 2 (two) times daily.    Marland Kitchen aspirin 81 MG tablet Take 81 mg by mouth daily.    Marland Kitchen atorvastatin (LIPITOR) 80 MG tablet Take 40 mg by mouth daily.    . clopidogrel (PLAVIX) 75 MG tablet Take 75 mg by mouth daily.  3  . diclofenac sodium (VOLTAREN) 1 % GEL Apply 2 g topically 4 (four) times daily as needed (pain). 100 g 0  . glipiZIDE (GLUCOTROL) 10 MG tablet Take 10 mg by mouth 2 (two) times daily before a meal. Take 30 minutes before meals    . losartan (COZAAR) 100 MG tablet Take 100 mg by mouth daily.     . metoprolol succinate (TOPROL-XL) 50 MG 24 hr tablet Take 50 mg by mouth daily. Take with or immediately following a meal.    . Omega-3 Fatty Acids (FISH OIL) 1000 MG CAPS Take 1,000 mg by mouth 2 (two) times daily.    . psyllium (HYDROCIL/METAMUCIL) 95 % PACK Take 1 packet by mouth 2 (two) times daily.     Marland Kitchen senna-docusate (SENOKOT-S) 8.6-50 MG tablet Take 1 tablet by mouth  at bedtime. 30 tablet 0  . nitroGLYCERIN (NITROSTAT) 0.4 MG SL tablet Place 1 tablet (0.4 mg total) under the tongue every 5 (five) minutes x 3 doses as needed for chest pain. 25 tablet 3   No current facility-administered medications on file prior to visit.    Allergies:  No Known Allergies  Physical Exam General: well developed, well nourished elderly African-American male, seated, in no evident distress Head: head normocephalic and atraumatic.   Neck: supple with no carotid or supraclavicular bruits Cardiovascular: regular rate and rhythm, no murmurs Musculoskeletal: no deformity Skin:  no rash/petichiae Vascular:  Normal pulses all  extremities  Neurologic Exam Mental Status: Awake and fully alert. Oriented to place and time. Recent and remote memory diminished. Attention span, concentration and fund of knowledge poor. Mood and affect appropriate.  Mini-Mental status exam not done today. (Last visit 13/30 with deficits in orientation, attention, calculation, recall and following three-step commands.)  Diminished recall 0/3.  Able to name only 3 animals which can walk on 4 legs.  Clock drawing 2/4.  He was able to copy intersecting pentagons well.   Cranial Nerves: Fundoscopic exam not done pupils equal, briskly reactive to light. Extraocular movements full without nystagmus. Visual fields full to confrontation. Hearing diminished bilaterally facial sensation intact. Face, tongue, palate moves normally and symmetrically.  Motor: Normal bulk and tone. Normal strength in all tested extremity muscles.  Except wasting of both thenar eminences.  Mild weakness of abductor pollicis and extensor pollicis in the right hand. Sensory.:  diminished touch pinprick sensation in the right thumb and index finger only.  Positive weak Tinel sign on both wrists right greater than left.  Diminished vibration sensations over ankles bilaterally.  Romberg sign is weakly positive. Coordination: Rapid alternating  movements normal in all extremities. Finger-to-nose and heel-to-shin performed accurately bilaterally. Gait and Station: Arises from chair without difficulty. Stance is normal. Gait demonstrates normal stride length and balance . Able to heel, toe and tandem walk with great difficulty.  Reflexes: 1+ and symmetric in upper extremities and both knee and ankle jerks are absent.. Toes downgoing.       ASSESSMENT: 87 year old African-American male with bilateral hand right greater than left weakness secondary to severe end-stage carpal tunnel syndrome.  Cervical radiculopathy and underlying diabtic neuropathy felt to be less likely.  New complaints of subacute cognitive and memory decline likely due to Alzheimer's .  He has not been able to tolerate Namenda due to side effects    PLAN: I had a long discussion the patient and his wife regarding his dementia and discuss treatment options and answered questions.  Unfortunately was not able to tolerate Namenda but I recommend a trial of Aricept instead.  Start 5 mg daily for 4 weeks and increase if tolerated without side effects to 10 mg daily.  Also check EEG.  Patient will not be cooperative for an MRI but he apparently had 1 in August 2020 have advised wife to get me the results of old films if possible.  He will return for follow-up in 3 months or call earlier if necessary. Greater than 50% time during this prolonged 25-minute   visit was spent on counseling and coordination of care about his hand numbness and carpal tunnel as well as memory loss and diagnosis of Alzheimer's and answering questions.   Delia Heady, MD  [facility] [address] [place], [place] [address]  Phone [phone] Fax [phone] Note: This document was prepared with digital dictation and possible smart phrase technology. Any transcriptional errors that result from this process are unintentional.

## 2020-09-16 NOTE — Patient Instructions (Signed)
I had a long discussion the patient and his wife regarding his dementia and discuss treatment options and answered questions.  Unfortunately was not able to tolerate Namenda but I recommend a trial of Aricept instead.  Start 5 mg daily for 4 weeks and increase if tolerated without side effects to 10 mg daily.  Also check EEG.  Patient will not be cooperative for an MRI but he apparently had 1 in August 2020 have advised wife to get me the results of old films if possible.  He will return for follow-up in 3 months or call earlier if necessary.  Alzheimer's Disease Caregiver Guide Alzheimer's disease is a condition that makes a person:  Forget things.  Act differently.  Have trouble paying attention and doing simple tasks. These things get worse with time. The tips below can help you care for the person. How to help manage lifestyle changes Tips to help with symptoms  Be calm and patient.  Give simple, short answers to questions.  Avoid correcting the person in a negative way.  Try not to take things personally, even if the person forgets your name.  Do not argue with the person. This may make the person more upset. Tips to lessen frustration  Make appointments and do daily tasks when the person is at his or her best.  Take your time. Simple tasks may take longer. Allow plenty of time to complete tasks.  Limit choices for the person.  Involve the person in what you are doing.  Keep things organized: ? Keep a daily routine. ? Organize medicines in a pillbox for each day of the week. ? Keep a calendar in a central location to remind the person of meetings or other activities.  Avoid new or crowded places, if possible.  Use simple words, short sentences, and a calm voice. Only give one direction at a time.  Buy clothes and shoes that are easy to put on and take off.  Try to change the subject if the person becomes frustrated or angry. Tips to prevent injury  Keep floors  clear. Remove rugs, magazine racks, and floor lamps.  Keep hallways well-lit.  Put a handrail and non-slip mat in the bathtub or shower.  Put childproof locks on cabinets that have dangerous items in them. These items include medicine, alcohol, guns, toxic cleaning items, sharp tools, matches, and lighters.  Put locks on doors where the person cannot see or reach them. This helps keep the person from going out of the house and getting lost.  Be ready for emergencies. Keep a list of emergency phone numbers and addresses close by.  Remove car keys and lock garage doors so that the person does not try to drive.  Bracelets may be worn that track location and identify the person as having memory problems. This should be worn at all times for safety.   Tips for the future  Discuss financial and legal planning early. People with this disease have trouble managing their money as the disease gets worse. Get help from a professional.  Talk about advance directives, safety, and daily care. Take these steps: ? Create a living will and choose a power of attorney. This is someone who can make decisions for the person with Alzheimer's disease when he or she can no longer do so. ? Discuss driving safety and when to stop driving. The person's doctor can help with this. ? If the person lives alone, make sure he or she is safe. Some people  need extra help at home. Other people need more care at a nursing home or care center.   How to recognize changes in the person's condition With this disease, memory problems and confusion slowly get worse. In time, the person may not know his or her friends and family members. The disease can also cause changes in behavior and mood, such as anxiety or anger. The person may see, hear, taste, smell, or feel things that are not real (hallucinate). These changes can come on all of a sudden. They may happen in response to something such as:  Pain.  An infection.  Changes  in temperature or noise.  Too much stimulation.  Feeling lost or scared.  Medicines. Where to find support  Find out about services that can provide short-term care (respite care). These can allow you to take a break when you need it.  Join a support group near you. These groups can help you: ? Learn ways to manage stress. ? Share experiences with others. ? Get emotional comfort and support. ? Learn about caregiving as the disease gets worse. ? Know what community resources are available. Where to find more information  Alzheimer's Association: Limit[employer] Contact a doctor if:  The person has a fever.  The person has a sudden behavior change that does not get better with calming strategies.  The person is not able to take care of himself or herself at home.  You are no longer able to care for the person. Get help right away if:  The person has a sudden increase in confusion or new hallucinations.  The person threatens you or anyone else, including himself or herself. Get help right away if you feel like your loved one may hurt himself or herself or others, or has thoughts about taking his or her own life. Go to your nearest emergency room or:  Call your local emergency services (911 in the [place]  Call the National Suicide Prevention Lifeline at 276 438 2593. This is open 24 hours a day.  Text the Crisis Text Line at 581-026-9841. Summary  Alzheimer's disease causes a person to forget things.  A person who has this condition may have trouble doing simple tasks.  Take steps to keep the person from getting hurt. Plan for future care.  You can find support by joining a support group near you. This information is not intended to replace advice given to you by your health care provider. Make sure you discuss any questions you have with your health care provider. Document Revised: 10/14/2019 Document Reviewed: 10/14/2019 Elsevier Patient Education  2021 [employer].

## 2020-09-19 DIAGNOSIS — I5089 Other heart failure: Secondary | ICD-10-CM | POA: Diagnosis not present

## 2020-09-19 DIAGNOSIS — R069 Unspecified abnormalities of breathing: Secondary | ICD-10-CM | POA: Diagnosis not present

## 2020-09-29 ENCOUNTER — Telehealth: Payer: Self-pay | Admitting: *Deleted

## 2020-09-29 NOTE — Telephone Encounter (Signed)
I called 2 times not able to reach pt wife.

## 2020-10-15 ENCOUNTER — Other Ambulatory Visit: Payer: Medicare Other

## 2020-10-20 ENCOUNTER — Other Ambulatory Visit: Payer: Medicare Other

## 2020-10-20 DIAGNOSIS — R069 Unspecified abnormalities of breathing: Secondary | ICD-10-CM | POA: Diagnosis not present

## 2020-10-20 DIAGNOSIS — I5089 Other heart failure: Secondary | ICD-10-CM | POA: Diagnosis not present

## 2020-10-27 ENCOUNTER — Other Ambulatory Visit: Payer: Medicare Other

## 2020-11-12 ENCOUNTER — Ambulatory Visit: Payer: Medicare Other | Admitting: Neurology

## 2020-11-12 DIAGNOSIS — R410 Disorientation, unspecified: Secondary | ICD-10-CM | POA: Diagnosis not present

## 2020-11-12 DIAGNOSIS — F0390 Unspecified dementia without behavioral disturbance: Secondary | ICD-10-CM

## 2020-11-12 DIAGNOSIS — R413 Other amnesia: Secondary | ICD-10-CM

## 2020-11-13 NOTE — Progress Notes (Signed)
Kindly inform the patient that EEG study was normal

## 2020-11-16 ENCOUNTER — Telehealth: Payer: Self-pay | Admitting: Emergency Medicine

## 2020-11-16 ENCOUNTER — Telehealth: Payer: Self-pay | Admitting: *Deleted

## 2020-11-16 NOTE — Telephone Encounter (Signed)
[facility] Jarrett Soho given to Stanton Kidney in medical records.

## 2020-11-16 NOTE — Telephone Encounter (Signed)
-----   Message from Arther Abbott, RN sent at 11/16/2020  3:48 PM EDT -----  ----- Message ----- From: Micki Riley, MD Sent: 11/13/2020   9:38 AM EDT To: Lenise Arena, RN  Kindly inform the patient that EEG study was normal

## 2020-11-16 NOTE — Telephone Encounter (Signed)
LVM advising EEG normal per Dr. Pearlean Brownie. Advised they did not have to return call unless they had further questions. Provided office phone#

## 2020-11-19 DIAGNOSIS — I5089 Other heart failure: Secondary | ICD-10-CM | POA: Diagnosis not present

## 2020-11-19 DIAGNOSIS — R069 Unspecified abnormalities of breathing: Secondary | ICD-10-CM | POA: Diagnosis not present

## 2020-12-02 DIAGNOSIS — I255 Ischemic cardiomyopathy: Secondary | ICD-10-CM | POA: Diagnosis not present

## 2020-12-20 DIAGNOSIS — R069 Unspecified abnormalities of breathing: Secondary | ICD-10-CM | POA: Diagnosis not present

## 2020-12-20 DIAGNOSIS — I5089 Other heart failure: Secondary | ICD-10-CM | POA: Diagnosis not present

## 2020-12-30 LAB — HEPATIC FUNCTION PANEL
ALT: 23 (ref 10–40)
AST: 20 (ref 14–40)
Alkaline Phosphatase: 74 (ref 25–125)
Bilirubin, Total: 1

## 2020-12-30 LAB — HEMOGLOBIN A1C: Hemoglobin A1C: 6.6

## 2020-12-30 LAB — BASIC METABOLIC PANEL
BUN: 19 (ref 4–21)
CO2: 33 — AB (ref 13–22)
Creatinine: 1.1 (ref 0.6–1.3)
Glucose: 145
Potassium: 4.2 (ref 3.4–5.3)
Sodium: 139 (ref 137–147)

## 2020-12-30 LAB — LIPID PANEL
Cholesterol: 163 (ref 0–200)
HDL: 56 (ref 35–70)
LDL Cholesterol: 90
Triglycerides: 134 (ref 40–160)

## 2020-12-30 LAB — CBC AND DIFFERENTIAL
HCT: 42 (ref 41–53)
Hemoglobin: 14.2 (ref 13.5–17.5)
Platelets: 189 (ref 150–399)
WBC: 5.9

## 2020-12-30 LAB — COMPREHENSIVE METABOLIC PANEL: Albumin: 3.6 (ref 3.5–5.0)

## 2021-01-06 ENCOUNTER — Emergency Department (HOSPITAL_COMMUNITY): Payer: No Typology Code available for payment source

## 2021-01-06 ENCOUNTER — Emergency Department (HOSPITAL_COMMUNITY)
Admission: EM | Admit: 2021-01-06 | Discharge: 2021-01-06 | Disposition: A | Discharge status: Home / Self Care | Payer: No Typology Code available for payment source | Attending: Emergency Medicine | Admitting: Emergency Medicine

## 2021-01-06 ENCOUNTER — Ambulatory Visit: Payer: Medicare Other | Admitting: Neurology

## 2021-01-06 ENCOUNTER — Other Ambulatory Visit: Payer: Self-pay

## 2021-01-06 DIAGNOSIS — Z7984 Long term (current) use of oral hypoglycemic drugs: Secondary | ICD-10-CM | POA: Diagnosis not present

## 2021-01-06 DIAGNOSIS — F0390 Unspecified dementia without behavioral disturbance: Secondary | ICD-10-CM | POA: Diagnosis not present

## 2021-01-06 DIAGNOSIS — I10 Essential (primary) hypertension: Secondary | ICD-10-CM | POA: Insufficient documentation

## 2021-01-06 DIAGNOSIS — R231 Pallor: Secondary | ICD-10-CM

## 2021-01-06 DIAGNOSIS — I2510 Atherosclerotic heart disease of native coronary artery without angina pectoris: Secondary | ICD-10-CM | POA: Insufficient documentation

## 2021-01-06 DIAGNOSIS — E1159 Type 2 diabetes mellitus with other circulatory complications: Secondary | ICD-10-CM | POA: Insufficient documentation

## 2021-01-06 DIAGNOSIS — Z7982 Long term (current) use of aspirin: Secondary | ICD-10-CM | POA: Insufficient documentation

## 2021-01-06 DIAGNOSIS — R079 Chest pain, unspecified: Secondary | ICD-10-CM | POA: Diagnosis not present

## 2021-01-06 DIAGNOSIS — Z79899 Other long term (current) drug therapy: Secondary | ICD-10-CM | POA: Diagnosis not present

## 2021-01-06 DIAGNOSIS — Z955 Presence of coronary angioplasty implant and graft: Secondary | ICD-10-CM | POA: Diagnosis not present

## 2021-01-06 DIAGNOSIS — R6889 Other general symptoms and signs: Secondary | ICD-10-CM | POA: Diagnosis not present

## 2021-01-06 DIAGNOSIS — R5383 Other fatigue: Secondary | ICD-10-CM | POA: Diagnosis not present

## 2021-01-06 DIAGNOSIS — R42 Dizziness and giddiness: Secondary | ICD-10-CM | POA: Insufficient documentation

## 2021-01-06 DIAGNOSIS — Z743 Need for continuous supervision: Secondary | ICD-10-CM | POA: Diagnosis not present

## 2021-01-06 LAB — URINALYSIS, ROUTINE W REFLEX MICROSCOPIC
Bilirubin Urine: NEGATIVE
Glucose, UA: NEGATIVE mg/dL
Hgb urine dipstick: NEGATIVE
Ketones, ur: NEGATIVE mg/dL
Leukocytes,Ua: NEGATIVE
Nitrite: NEGATIVE
Protein, ur: NEGATIVE mg/dL
Specific Gravity, Urine: 1.013 (ref 1.005–1.030)
pH: 7 (ref 5.0–8.0)

## 2021-01-06 LAB — CBC WITH DIFFERENTIAL/PLATELET
Abs Immature Granulocytes: 0.01 10*3/uL (ref 0.00–0.07)
Basophils Absolute: 0 10*3/uL (ref 0.0–0.1)
Basophils Relative: 1 %
Eosinophils Absolute: 0.3 10*3/uL (ref 0.0–0.5)
Eosinophils Relative: 5 %
HCT: 42.4 % (ref 39.0–52.0)
Hemoglobin: 14.6 g/dL (ref 13.0–17.0)
Immature Granulocytes: 0 %
Lymphocytes Relative: 32 %
Lymphs Abs: 2.3 10*3/uL (ref 0.7–4.0)
MCH: 31.1 pg (ref 26.0–34.0)
MCHC: 34.4 g/dL (ref 30.0–36.0)
MCV: 90.4 fL (ref 80.0–100.0)
Monocytes Absolute: 0.6 10*3/uL (ref 0.1–1.0)
Monocytes Relative: 8 %
Neutro Abs: 3.9 10*3/uL (ref 1.7–7.7)
Neutrophils Relative %: 54 %
Platelets: 178 10*3/uL (ref 150–400)
RBC: 4.69 MIL/uL (ref 4.22–5.81)
RDW: 12.4 % (ref 11.5–15.5)
WBC: 7.2 10*3/uL (ref 4.0–10.5)
nRBC: 0 % (ref 0.0–0.2)

## 2021-01-06 LAB — TROPONIN I (HIGH SENSITIVITY)
Troponin I (High Sensitivity): 14 ng/L (ref <18)
Troponin I (High Sensitivity): 14 ng/L (ref <18)

## 2021-01-06 LAB — BASIC METABOLIC PANEL
Anion gap: 8 (ref 5–15)
BUN: 16 mg/dL (ref 8–23)
CO2: 28 mmol/L (ref 22–32)
Calcium: 9.1 mg/dL (ref 8.9–10.3)
Chloride: 102 mmol/L (ref 98–111)
Creatinine, Ser: 0.92 mg/dL (ref 0.61–1.24)
GFR, Estimated: >60 mL/min (ref >60)
Glucose, Bld: 103 mg/dL — ABNORMAL HIGH (ref 70–99)
Potassium: 3.9 mmol/L (ref 3.5–5.1)
Sodium: 138 mmol/L (ref 135–145)

## 2021-01-06 LAB — CBG MONITORING, ED: Glucose-Capillary: 105 mg/dL — ABNORMAL HIGH (ref 70–99)

## 2021-01-06 NOTE — ED Triage Notes (Signed)
Pt arrived via ems due to pt feeling funny, pt reported feeling dizzy and light headed. Pts baseline is axox1 (alert to self). Pt does have a hx of dementia. EMS reports pt has had cardiac stents placed in the past. Pt denies any cp or sob.   EMS VS: BP 166/84 hr 67 spo2 97 cbg 141

## 2021-01-06 NOTE — Discharge Instructions (Addendum)
Call Dr. Annitta Jersey office to be seen tomorrow, June 30.  Return to the emergency department for chest pain, shortness of breath, dizziness, passing out, overall feeling unwell, or any other major concerns.

## 2021-01-06 NOTE — ED Provider Notes (Signed)
[facility] EMERGENCY DEPARTMENT Provider Note   CSN: [id number] Arrival date & time: 01/06/21  5956     History Chief Complaint  Patient presents with   Dizziness    DELAND SLOCUMB is a 87 y.o. 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  The history is provided by the patient's wife.  The patient's wife reports that the patient awoke in the middle of the night to walk to the bathroom and voided.  She became concerned because after he stood up and started to walk, he sat back down.  When she asked him why he did not walk to the bathroom, he told her he "felt funny".  She went in the other room to get his blood pressure cuff and when she came back he had walked to the bathroom by himself.  She is concerned because the last time that he stated that he "felt funny".  Blood pressure was 137/49.  She states that the last time he stated that he felt different he was admitted to the hospital and had a heart catheterization.  She does report that earlier in the day he seemed to last active.  She states that the patient loves to mow the lawn, and earlier in the day she asked him if he wanted to mow the lawn, but he declined and said he would do it another day, which is very unusual for the patient.  She denies any other change from the patient's baseline status.  His baseline is ANO x1, but sometimes he is not oriented to self.  On my evaluation, the patient has no complaints.  He has a 2 to 3-minute loop where he talks about how he likes working outside and using his hands.  He does not know why he is here.  Level 5 caveat secondary to dementia.  The history is provided by medical records and the spouse. No  language interpreter was used.      Past Medical History:  Diagnosis Date   Acute encephalopathy 02/12/2018   CAP (community acquired pneumonia) 02/11/2018   Carpal tunnel syndrome, bilateral 02/25/2020   Coronary artery disease    Diabetes mellitus    Diverticulitis    Hypertension    Hypokalemia 02/12/2018    Patient Active Problem List   Diagnosis Date Noted   Carpal tunnel syndrome, bilateral 02/25/2020   Numbness 11/28/2019   Raynaud's phenomenon 10/30/2019   Side pain 09/13/2018   CAD (coronary artery disease) 02/12/2018   Left foot drop 02/12/2018   Type 2 diabetes mellitus with vascular disease (HCC) 02/12/2018   Essential hypertension 02/12/2018   Elevated LFTs 02/12/2018   Nausea & vomiting 02/12/2018    Past Surgical History:  Procedure Laterality Date   ANGIOPLASTY     CORONARY STENT INTERVENTION N/A 02/02/2017   Procedure: Coronary Stent Intervention;  Surgeon: Rinaldo Cloud, MD;  Location: [facility] INVASIVE CV LAB;  Service: Cardiovascular;  Laterality: N/A;   CORONARY STENTS     LEFT HEART CATH AND CORONARY ANGIOGRAPHY N/A 02/02/2017   Procedure: Left Heart Cath and Coronary Angiography;  Surgeon: Rinaldo Cloud, MD;  Location: [facility] INVASIVE CV LAB;  Service: Cardiovascular;  Laterality: N/A;   LEFT HEART CATHETERIZATION WITH CORONARY ANGIOGRAM  N/A 03/16/2012   Procedure: LEFT HEART CATHETERIZATION WITH CORONARY ANGIOGRAM;  Surgeon: Robynn Pane, MD;  Location: [facility] CATH LAB;  Service: Cardiovascular;  Laterality: N/A;   PERCUTANEOUS CORONARY STENT INTERVENTION (PCI-S)  03/16/2012   Procedure: PERCUTANEOUS CORONARY STENT INTERVENTION (PCI-S);  Surgeon: Robynn Pane, MD;  Location: [facility] CATH LAB;  Service: Cardiovascular;;   TONSILLECTOMY         No family history on file.  Social History   Tobacco Use   Smoking status: Never   Smokeless tobacco: Never  Substance Use Topics   Alcohol use: No    Comment: social   Drug use: No    Home Medications Prior to  Admission medications   Medication Sig Start Date End Date Taking? Authorizing Provider  acetaminophen (TYLENOL) 500 MG tablet Take 500 mg by mouth every 6 (six) hours as needed for moderate pain.   Yes [provider]  amLODipine (NORVASC) 10 MG tablet Take 5 mg by mouth 2 (two) times daily.   Yes [provider]  aspirin 81 MG tablet Take 81 mg by mouth daily.   Yes [provider]  atorvastatin (LIPITOR) 80 MG tablet Take 40 mg by mouth every evening.   Yes [provider]  clopidogrel (PLAVIX) 75 MG tablet Take 75 mg by mouth daily. 01/02/17  Yes [provider]  diclofenac sodium (VOLTAREN) 1 % GEL Apply 2 g topically 4 (four) times daily as needed (pain). 11/01/18  Yes Melene Plan, DO  glipiZIDE (GLUCOTROL) 10 MG tablet Take 10 mg by mouth 2 (two) times daily before a meal. Take 30 minutes before meals   Yes [provider]  losartan (COZAAR) 100 MG tablet Take 100 mg by mouth daily.    Yes [provider]  metoprolol succinate (TOPROL-XL) 50 MG 24 hr tablet Take 50 mg by mouth at bedtime. Take with or immediately following a meal.   Yes [provider]  Multiple Vitamin (MULTIVITAMIN) tablet Take 1 tablet by mouth daily.   Yes [provider]  nitroGLYCERIN (NITROSTAT) 0.4 MG SL tablet Place 1 tablet (0.4 mg total) under the tongue every 5 (five) minutes x 3 doses as needed for chest pain. 03/18/12 01/06/21 Yes Rinaldo Cloud, MD  psyllium (HYDROCIL/METAMUCIL) 95 % PACK Take 1 packet by mouth 2 (two) times daily.    Yes [provider]  donepezil (ARICEPT) 10 MG tablet Take 1 tablet (10 mg total) by mouth at bedtime. Start 0.5 tablet daily x 4 weeks and then 1 tablet daily Patient not taking: No sig reported 09/16/20   Micki Riley, MD  senna-docusate (SENOKOT-S) 8.6-50 MG tablet Take 1 tablet by mouth at bedtime. Patient not taking: No sig reported 02/15/18   Albertine Grates, MD    Allergies    Lisinopril and  Metformin  Review of Systems   Review of Systems  Unable to perform ROS: Dementia   Physical Exam Updated Vital Signs BP (!) 152/65 (BP Location: Right Arm)   Pulse 73   Temp 98.3 F (36.8 C) (Oral)   Resp 18   Ht 5\' 3"  (1.6 m)   Wt 75.8 kg   SpO2 99%   BMI 29.60 kg/m   Physical Exam Vitals and nursing note reviewed.  Constitutional:      Appearance: He is well-developed.     Comments: Smiling.  Laughing.  Pleasantly demented.  HENT:     Head: Normocephalic.  Eyes:     Extraocular Movements: Extraocular movements intact.  Conjunctiva/sclera: Conjunctivae normal.     Pupils: Pupils are equal, round, and reactive to light.  Cardiovascular:     Rate and Rhythm: Normal rate and regular rhythm.     Pulses: Normal pulses.     Heart sounds: Normal heart sounds. No murmur heard.   No friction rub. No gallop.  Pulmonary:     Effort: Pulmonary effort is normal. No respiratory distress.     Breath sounds: No stridor. No wheezing, rhonchi or rales.     Comments: Lungs are clear to auscultation bilaterally. Chest:     Chest wall: No tenderness.  Abdominal:     General: There is no distension.     Palpations: Abdomen is soft. There is no mass.     Tenderness: There is no abdominal tenderness. There is no right CVA tenderness, left CVA tenderness, guarding or rebound.     Hernia: No hernia is present.     Comments: Abdomen is soft and nondistended.  Nontender.  Musculoskeletal:     Cervical back: Neck supple.     Right lower leg: No edema.     Left lower leg: No edema.  Skin:    General: Skin is warm and dry.  Neurological:     Mental Status: He is alert.     Comments: ANO x1.  Good strength against resistance of the bilateral upper and lower extremities.  Follows simple commands.  Sensation is intact throughout.  Finger-nose is intact bilaterally.  Cranial nerves II through XII are grossly intact.  Psychiatric:        Behavior: Behavior normal.    ED Results /  Procedures / Treatments   Labs (all labs ordered are listed, but only abnormal results are displayed) Labs Reviewed  BASIC METABOLIC PANEL - Abnormal; Notable for the following components:      Result Value   Glucose, Bld 103 (*)    All other components within normal limits  CBG MONITORING, ED - Abnormal; Notable for the following components:   Glucose-Capillary 105 (*)    All other components within normal limits  CBC WITH DIFFERENTIAL/PLATELET  URINALYSIS, ROUTINE W REFLEX MICROSCOPIC  TROPONIN I (HIGH SENSITIVITY)  TROPONIN I (HIGH SENSITIVITY)    EKG EKG Interpretation  Date/Time:  Wednesday January 06 2021 05:50:21 EDT Ventricular Rate:  77 PR Interval:  162 QRS Duration: 94 QT Interval:  384 QTC Calculation: 434 R Axis:   9 Text Interpretation: Sinus rhythm with occasional Premature ventricular complexes Otherwise normal ECG No significant change was found Confirmed by Glynn Octaveancour, Stephen [phone]([id number]) on 01/06/2021 6:35:30 AM  Radiology DG Chest 2 View  Result Date: 01/06/2021 CLINICAL DATA:  Chest pain. EXAM: CHEST - 2 VIEW COMPARISON:  Chest x-ray 04/02/2018. FINDINGS: Mediastinum hilar structures normal. Heart size normal. Coronary artery calcification. Aortic and carotid atherosclerotic vascular calcification. Low lung volumes. No focal infiltrate. No pleural effusion or pneumothorax. Mild thoracic spine scoliosis. Thoracolumbar spine degenerative change. Degenerative changes both shoulders. IMPRESSION: 1. Coronary artery atherosclerotic vascular disease. Aortic and carotid atherosclerotic vascular disease. Heart size normal. 2.  Low lung volumes.  No acute pulmonary disease. Electronically Signed   By: Maisie Fushomas  Register   On: 01/06/2021 06:27    Procedures Procedures   Medications Ordered in ED Medications - No data to display  ED Course  I have reviewed the triage vital signs and the nursing notes.  Pertinent labs & imaging results that were available during my care of the  patient were reviewed by me and considered in  my medical decision making (see chart for details).    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  She reports that he awoke to walk to the bathroom, but after standing starting to walk, he went back and sat down on the edge of the bed, which is unusual for him.  He reports that he seemed more fatigued earlier in the day as he declined mowing the lawn, which is one of his favorite activities and he never turned this down.  Patient has no complaints at this time.  He is pleasantly demented, smiling, no acute distress.  The patient was seen and independently evaluated by Dr. Manus Gunning, attending physician.  Vital signs are stable.  EKG with PVCs, but no other changes from previous.  Chest x-ray with no acute findings. Labs are pending.  Given patient's complex history of CAD, he will require 2 troponins.  Would recommend consulting cardiology given his history of silent MI.  Patient care transferred to PA Joy at the end of my shift. Patient presentation, ED course, and plan of care discussed with review of all pertinent labs and imaging. Please see his/her note for further details regarding further ED course and disposition.   Final Clinical Impression(s) / ED Diagnoses Final diagnoses:  None    Rx / DC Orders ED Discharge Orders     None        Barkley Boards, PA-C 01/06/21 0734    Glynn Octave, MD 01/07/21 [phone]

## 2021-01-06 NOTE — ED Notes (Signed)
Pt was given [place] sandwich & a drink with EDP permission.

## 2021-01-06 NOTE — ED Provider Notes (Signed)
Alexander Donaldson is a 87 y.o. male, presenting to the ED with an episode this morning around 3 AM where he "did not feel right." Patient does not remember the episode, which his wife states is not unusual given his memory and cognitive issues. Wife adds that patient also "didn't look right" with pallor and diaphoresis.  HPI from Frederik Pear, PA-C: "Alexander Donaldson is a 87 y.o. 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   The history is provided by the patient's wife.  The patient's wife reports that the patient awoke in the middle of the night to walk to the bathroom and voided.  She became concerned because after he stood up and started to walk, he sat back down.  When she asked him why he did not walk to the bathroom, he told her he "felt funny".  She went in the other room to get his blood pressure cuff and when she came back he had walked to the bathroom by himself.  She is concerned because the last time that he stated that he "felt funny".  Blood pressure was 137/49.  She states that the last time he stated that he felt different he was admitted to the hospital and had a heart catheterization.  She does report that earlier in the day he seemed to last active.  She states that the patient loves to mow the lawn, and earlier in the day she asked him if he wanted to mow the lawn, but he declined and said he would do it another day, which is very unusual for the patient.  She denies any other change from the patient's baseline status.  His baseline is ANO x1, but sometimes he is not oriented to self.   On my evaluation, the patient has no complaints.  He has a 2 to 3-minute loop where he talks about how he likes working outside and  using his hands.  He does not know why he is here."   Past Medical History:  Diagnosis Date   Acute encephalopathy 02/12/2018   CAP (community acquired pneumonia) 02/11/2018   Carpal tunnel syndrome, bilateral 02/25/2020   Coronary artery disease    Diabetes mellitus    Diverticulitis    Hypertension    Hypokalemia 02/12/2018    Physical Exam  BP (!) 126/93 (BP Location: Right Arm)   Pulse 68   Temp 98.2 F (36.8 C) (Oral)   Resp 18   Ht 5\' 3"  (1.6 m)   Wt 75.8 kg   SpO2 98%   BMI 29.60 kg/m   Physical Exam Vitals and nursing note reviewed.  Constitutional:      General: He is not in acute distress.    Appearance: He is well-developed. He is not diaphoretic.  HENT:     Head: Normocephalic and atraumatic.     Mouth/Throat:     Mouth: Mucous membranes are moist.     Pharynx: Oropharynx is clear.  Eyes:     Conjunctiva/sclera: Conjunctivae normal.  Cardiovascular:     Rate and Rhythm: Normal rate and regular rhythm.     Pulses: Normal pulses.          Radial pulses are 2+ on the right side and 2+  on the left side.       Posterior tibial pulses are 2+ on the right side and 2+ on the left side.     Heart sounds: Normal heart sounds.     Comments: Tactile temperature in the extremities appropriate and equal bilaterally. Pulmonary:     Effort: Pulmonary effort is normal. No respiratory distress.     Breath sounds: Normal breath sounds.  Abdominal:     Palpations: Abdomen is soft.     Tenderness: There is no abdominal tenderness. There is no guarding.  Musculoskeletal:     Cervical back: Neck supple.     Right lower leg: No edema.     Left lower leg: No edema.  Skin:    General: Skin is warm and dry.  Neurological:     Mental Status: He is alert.     Comments: No noted acute cognitive deficit. Sensation grossly intact to light touch in the extremities.   Grip strengths equal bilaterally.   Strength 5/5 in all extremities.  Coordination intact.  Cranial nerves III-XII  grossly intact.  Handles oral secretions without noted difficulty.  No noted phonation or speech deficit. No facial droop.   Psychiatric:        Mood and Affect: Mood and affect normal.        Speech: Speech normal.        Behavior: Behavior normal.    ED Course/Procedures     Procedures  Abnormal Labs Reviewed  BASIC METABOLIC PANEL - Abnormal; Notable for the following components:      Result Value   Glucose, Bld 103 (*)    All other components within normal limits  CBG MONITORING, ED - Abnormal; Notable for the following components:   Glucose-Capillary 105 (*)    All other components within normal limits    DG Chest 2 View  Result Date: 01/06/2021 CLINICAL DATA:  Chest pain. EXAM: CHEST - 2 VIEW COMPARISON:  Chest x-ray 04/02/2018. FINDINGS: Mediastinum hilar structures normal. Heart size normal. Coronary artery calcification. Aortic and carotid atherosclerotic vascular calcification. Low lung volumes. No focal infiltrate. No pleural effusion or pneumothorax. Mild thoracic spine scoliosis. Thoracolumbar spine degenerative change. Degenerative changes both shoulders. IMPRESSION: 1. Coronary artery atherosclerotic vascular disease. Aortic and carotid atherosclerotic vascular disease. Heart size normal. 2.  Low lung volumes.  No acute pulmonary disease. Electronically Signed   By: Maisie Fus  Register   On: 01/06/2021 06:27     EKG Interpretation  Date/Time:  Wednesday January 06 2021 05:50:21 EDT Ventricular Rate:  77 PR Interval:  162 QRS Duration: 94 QT Interval:  384 QTC Calculation: 434 R Axis:   9 Text Interpretation: Sinus rhythm with occasional Premature ventricular complexes Otherwise normal ECG No significant change was found Confirmed by Glynn Octave [phone]) on 01/06/2021 6:35:30 AM         MDM    Clinical Course as of 01/06/21 1636  Wed Jan 06, 2021  0839 Spoke with Dr. Sharyn Lull, patient's cardiologist. We discussed the patient's episode that occurred earlier  this morning, initial lab results, EKG, and past cardiac history. Advises that we speak again after her second troponin has resulted. [SJ]  1113 Spoke with Dr. Sharyn Lull and we again discussed the patient's work-up and all results. He states patient may be discharged and can follow-up with him in the office tomorrow. [SJ]    Clinical Course User Index [SJ] Anselm Pancoast, PA-C      Patient care handoff report received from Frederik Pear,  PA-C. Plan: Much of the patient's work-up is pending, including troponins.    Patient presents for evaluation following an episode of feeling unwell that lasted a couple minutes and did not recur. Patient is nontoxic appearing, afebrile, not tachycardic, not tachypneic, not hypotensive, maintains excellent SPO2 on room air, and is in no apparent distress.   I have reviewed the patient's chart to obtain more information.   I reviewed and interpreted the patient's labs and radiological studies. Work-up is reassuring.  He has close cardiology follow-up. Patient and his wife were given instructions for home care as well as return precautions.  Both parties voice understanding of these instructions, accept the plan, and are comfortable with discharge.   Vitals:   01/06/21 0519 01/06/21 0521 01/06/21 0644  BP: (!) 126/93  (!) 152/65  Pulse: 68  73  Resp: 18  18  Temp: 98.2 F (36.8 C)  98.3 F (36.8 C)  TempSrc: Oral  Oral  SpO2: 98%  99%  Weight:  75.8 kg   Height:  5\' 3"  (1.6 m)    Vitals:   01/06/21 0521 01/06/21 0644 01/06/21 0916 01/06/21 1207  BP:  (!) 152/65 (!) 171/69 (!) 163/79  Pulse:  73 67 60  Resp:  18 18 17   Temp:  98.3 F (36.8 C) 98.4 F (36.9 C) 97.8 F (36.6 C)  TempSrc:  Oral Oral Oral  SpO2:  99% 98% 99%  Weight: 75.8 kg     Height: 5\' 3"  (1.6 m)           01/08/21 01/06/21 1638    , MD 01/07/21 [phone]

## 2021-01-06 NOTE — ED Notes (Signed)
Patient transported to X-ray 

## 2021-01-07 DIAGNOSIS — E119 Type 2 diabetes mellitus without complications: Secondary | ICD-10-CM | POA: Diagnosis not present

## 2021-01-07 DIAGNOSIS — I10 Essential (primary) hypertension: Secondary | ICD-10-CM | POA: Diagnosis not present

## 2021-01-07 DIAGNOSIS — E785 Hyperlipidemia, unspecified: Secondary | ICD-10-CM | POA: Diagnosis not present

## 2021-01-07 DIAGNOSIS — M5416 Radiculopathy, lumbar region: Secondary | ICD-10-CM | POA: Diagnosis not present

## 2021-01-07 DIAGNOSIS — I480 Paroxysmal atrial fibrillation: Secondary | ICD-10-CM | POA: Diagnosis not present

## 2021-01-07 DIAGNOSIS — E1140 Type 2 diabetes mellitus with diabetic neuropathy, unspecified: Secondary | ICD-10-CM | POA: Diagnosis not present

## 2021-01-07 DIAGNOSIS — I2510 Atherosclerotic heart disease of native coronary artery without angina pectoris: Secondary | ICD-10-CM | POA: Diagnosis not present

## 2021-01-07 DIAGNOSIS — G459 Transient cerebral ischemic attack, unspecified: Secondary | ICD-10-CM | POA: Diagnosis not present

## 2021-01-07 DIAGNOSIS — I255 Ischemic cardiomyopathy: Secondary | ICD-10-CM | POA: Diagnosis not present

## 2021-01-19 DIAGNOSIS — R069 Unspecified abnormalities of breathing: Secondary | ICD-10-CM | POA: Diagnosis not present

## 2021-01-19 DIAGNOSIS — I5089 Other heart failure: Secondary | ICD-10-CM | POA: Diagnosis not present

## 2021-01-22 ENCOUNTER — Telehealth: Payer: Self-pay

## 2021-01-22 ENCOUNTER — Encounter: Payer: Self-pay | Admitting: Primary Care

## 2021-01-22 NOTE — Telephone Encounter (Signed)
New message    Forms drop off for PA to review . Place in MD box

## 2021-01-22 NOTE — Telephone Encounter (Signed)
No further action needed.

## 2021-01-22 NOTE — Telephone Encounter (Signed)
Labs received abstracted original placed in folder for review.

## 2021-01-26 NOTE — Telephone Encounter (Signed)
Labs reviewed and sent for scanning.

## 2021-02-19 DIAGNOSIS — R069 Unspecified abnormalities of breathing: Secondary | ICD-10-CM | POA: Diagnosis not present

## 2021-02-19 DIAGNOSIS — I5089 Other heart failure: Secondary | ICD-10-CM | POA: Diagnosis not present

## 2021-03-10 DIAGNOSIS — I10 Essential (primary) hypertension: Secondary | ICD-10-CM | POA: Diagnosis not present

## 2021-03-10 DIAGNOSIS — E785 Hyperlipidemia, unspecified: Secondary | ICD-10-CM | POA: Diagnosis not present

## 2021-03-10 DIAGNOSIS — I255 Ischemic cardiomyopathy: Secondary | ICD-10-CM | POA: Diagnosis not present

## 2021-03-10 DIAGNOSIS — I2510 Atherosclerotic heart disease of native coronary artery without angina pectoris: Secondary | ICD-10-CM | POA: Diagnosis not present

## 2021-03-10 DIAGNOSIS — I480 Paroxysmal atrial fibrillation: Secondary | ICD-10-CM | POA: Diagnosis not present

## 2021-03-10 DIAGNOSIS — M5416 Radiculopathy, lumbar region: Secondary | ICD-10-CM | POA: Diagnosis not present

## 2021-03-10 DIAGNOSIS — E119 Type 2 diabetes mellitus without complications: Secondary | ICD-10-CM | POA: Diagnosis not present

## 2021-03-10 DIAGNOSIS — E1140 Type 2 diabetes mellitus with diabetic neuropathy, unspecified: Secondary | ICD-10-CM | POA: Diagnosis not present

## 2021-03-22 DIAGNOSIS — R069 Unspecified abnormalities of breathing: Secondary | ICD-10-CM | POA: Diagnosis not present

## 2021-03-22 DIAGNOSIS — I5089 Other heart failure: Secondary | ICD-10-CM | POA: Diagnosis not present

## 2021-04-21 DIAGNOSIS — R069 Unspecified abnormalities of breathing: Secondary | ICD-10-CM | POA: Diagnosis not present

## 2021-04-21 DIAGNOSIS — I5089 Other heart failure: Secondary | ICD-10-CM | POA: Diagnosis not present

## 2021-05-22 DIAGNOSIS — I5089 Other heart failure: Secondary | ICD-10-CM | POA: Diagnosis not present

## 2021-05-22 DIAGNOSIS — R069 Unspecified abnormalities of breathing: Secondary | ICD-10-CM | POA: Diagnosis not present

## 2021-05-24 ENCOUNTER — Ambulatory Visit: Payer: Medicare Other | Admitting: Neurology

## 2021-05-24 ENCOUNTER — Other Ambulatory Visit: Payer: Self-pay

## 2021-05-24 VITALS — BP 153/67 | HR 58 | Ht 63.0 in | Wt 164.0 lb

## 2021-05-24 DIAGNOSIS — F02B0 Dementia in other diseases classified elsewhere, moderate, without behavioral disturbance, psychotic disturbance, mood disturbance, and anxiety: Secondary | ICD-10-CM

## 2021-05-24 DIAGNOSIS — G5603 Carpal tunnel syndrome, bilateral upper limbs: Secondary | ICD-10-CM | POA: Diagnosis not present

## 2021-05-24 DIAGNOSIS — R413 Other amnesia: Secondary | ICD-10-CM | POA: Diagnosis not present

## 2021-05-24 DIAGNOSIS — G301 Alzheimer's disease with late onset: Secondary | ICD-10-CM

## 2021-05-24 MED ORDER — MEMANTINE HCL 28 X 5 MG & 21 X 10 MG PO TABS
ORAL_TABLET | ORAL | 12 refills | Status: DC
Start: 2021-05-24 — End: 2021-09-29

## 2021-05-24 MED ORDER — MEMANTINE HCL 10 MG PO TABS: 10.0000 mg | ORAL_TABLET | Freq: Two times a day (BID) | ORAL | 3 refills | Status: DC

## 2021-05-24 NOTE — Patient Instructions (Signed)
I had a long discussion with patient and his wife regarding his progressive memory and cognitive worsening likely represents progressive dementia.  He has had trouble tolerating Aricept in the past hence agree with discontinuing it.  Trial of Namenda starter pack and increase as tolerated to 10 mg twice daily.  Continue 24-hour supervision at home.  Patient's carpal tunnel symptoms seem to be quite stable and recommend conservative follow-up.  Return follow-up in the future in 3 months or call earlier if necessary. Alzheimer's Disease Caregiver Guide Alzheimer's disease is a condition that makes a person: Forget things. Act differently. Have trouble paying attention and doing simple tasks. These things get worse with time. The tips below can help you care for the person. How to help manage lifestyle changes Tips to help with symptoms Be calm and patient. Give simple, short answers to questions. Avoid correcting the person in a negative way. Try not to take things personally, even if the person forgets your name. Do not argue with the person. This may make the person more upset. Tips to lessen frustration Make appointments and do daily tasks when the person is at his or her best. Take your time. Simple tasks may take longer. Allow plenty of time to complete tasks. Limit choices for the person. Involve the person in what you are doing. Keep things organized: Keep a daily routine. Organize medicines in a pillbox for each day of the week. Keep a calendar in a central location to remind the person of meetings or other activities. Avoid new or crowded places, if possible. Use simple words, short sentences, and a calm voice. Only give one direction at a time. Buy clothes and shoes that are easy to put on and take off. Try to change the subject if the person becomes frustrated or angry. Tips to prevent injury  Keep floors clear. Remove rugs, magazine racks, and floor lamps. Keep hallways  well-lit. Put a handrail and non-slip mat in the bathtub or shower. Put childproof locks on cabinets that have dangerous items in them. These items include medicine, alcohol, guns, toxic cleaning items, sharp tools, matches, and lighters. Put locks on doors where the person cannot see or reach them. This helps keep the person from going out of the house and getting lost. Be ready for emergencies. Keep a list of emergency phone numbers and addresses close by. Remove car keys and lock garage doors so that the person does not try to drive. Bracelets may be worn that track location and identify the person as having memory problems. This should be worn at all times for safety. Tips for the future  Discuss financial and legal planning early. People with this disease have trouble managing their money as the disease gets worse. Get help from a professional. Talk about advance directives, safety, and daily care. Take these steps: Create a living will and choose a power of attorney. This is someone who can make decisions for the person with Alzheimer's disease when he or she can no longer do so. Discuss driving safety and when to stop driving. The person's doctor can help with this. If the person lives alone, make sure he or she is safe. Some people need extra help at home. Other people need more care at a nursing home or care center. How to recognize changes in the person's condition With this disease, memory problems and confusion slowly get worse. In time, the person may not know his or her friends and family members. The disease can  also cause changes in behavior and mood, such as anxiety or anger. The person may see, hear, taste, smell, or feel things that are not real (hallucinate). These changes can come on all of a sudden. They may happen in response to something such as: Pain. An infection. Changes in temperature or noise. Too much stimulation. Feeling lost or scared. Medicines. Where to find  support Find out about services that can provide short-term care (respite care). These can allow you to take a break when you need it. Join a support group near you. These groups can help you: Learn ways to manage stress. Share experiences with others. Get emotional comfort and support. Learn about caregiving as the disease gets worse. Know what community resources are available. Where to find more information Alzheimer[employer]: [employer] Contact a doctor if: The person has a fever. The person has a sudden behavior change that does not get better with calming strategies. The person is not able to take care of himself or herself at home. You are no longer able to care for the person. Get help right away if: The person has a sudden increase in confusion or new hallucinations. The person threatens you or anyone else, including himself or herself. Get help right away if you feel like your loved one may hurt himself or herself or others, or has thoughts about taking his or her own life. Go to your nearest emergency room or: Call your local emergency services (911 in the [place] Call the National Suicide Prevention Lifeline at (480) 424-8998 or 988 in the [place] This is open 24 hours a day. Text the Crisis Text Line at 919-128-2101. Summary Alzheimer's disease causes a person to forget things. A person who has this condition may have trouble doing simple tasks. Take steps to keep the person from getting hurt. Plan for future care. You can find support by joining a support group near you. This information is not intended to replace advice given to you by your health care provider. Make sure you discuss any questions you have with your health care provider. Document Revised: 01/20/2021 Document Reviewed: 10/14/2019 Elsevier Patient Education  2022 [employer].

## 2021-05-24 NOTE — Progress Notes (Signed)
[facility] [address] [place] [place] [address] [phone]       OFFICE FOLLOW UP VISIT NOTE  Alexander Donaldson Date of Birth:  [birth year] Medical Record Number:  [id number]   Referring MD: Sammuel Cooper, NP Reason for Referral: Hand numbness HPI: Initial visit 01/20/2020: [name] is a pleasant 87 year old African-American male with past medical history of diabetes, hypertension, diverticulitis, coronary artery disease who has been complaining of bilateral hand numbness for the last 6 months.  Reports it is intermittently but most prominently at night or early in the morning when he wakes up with his right thumb and index finger feeling stiff and numb.  He feels his strength is good and he can still hold objects very well but his fingers feel different.  This is often severe at night and he often wakes up with this.  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  He has not been wearing any wrist splints.  Denies any neck pain, radicular pain, gait or balance problems.  Review of electronic medical records show that he had lab work on 11/28/2019 which include vitamin B12 which was 284 mg percent and hemoglobin A1c was elevated at 8.5.  Comprehensive metabolic panel labs were normal.  TSH was 1.91 on 01/22/2019.  She has not had any x-rays of his C-spine, MRI or EMG nerve conduction studies done. Update 03/31/2020: He returns for follow-up after last visit 2 months ago.  He continues to have weakness in his hand but states that the numbness is only in the index and middle fingertips and is not as bothersome.  EMG nerve conduction study done by Dr. Anne Hahn on 02/25/2020 confirmed severe end-stage carpal tunnel bilaterally right more than left with wasting and no activity in the right abductor pollicis on stimulation.  There is also chronic stable overlying C5 radiculopathy.  Patient's wife reports  today that for the last year or so she is noted progressive cognitive decline.  Recent short-term memory difficulties.  Is also had some behavioral changes and gets irritable and agitated easily.  Is quite forgetful.  Will not follow multistage commands and often gets distracted easily.  Also cannot have a proper conversation at times gets sidetracked.  She has been doing and more things for him.  Patient has no family history of Alzheimer's.  He has no prior history of strokes, TIA, seizures or head injury with loss of consciousness.  He has not had any recent brain imaging done. Update 09/16/2020: He returns for follow-up after last visit 6 months ago.  He is accompanied by his wife.  Patient had lab work at last visit for reversible causes of memory loss which was all normal.  Patient refused to undergo EEG and MRI which were ordered.  He did try the Namenda but was not able to tolerate 5 mg twice daily and developed significant sleepiness and hence it was stopped after a few weeks.  He continues to have significant memory and cognitive difficulties.  He requires constant supervision and his wife is around him most of the time.  Patient did see Dr. Franky Macho for his severe carpal tunnel with he refused to have surgery.  Continues to have numbness and tingling in his hands intermittently but still has good strength.  Patient of likely had an MRI done at the [place] in August 2020 and his wife  states she is trying to get me the report of the disc to review. Update 05/24/2021 : He returns for follow-up after last visit 8 months ago.  Is accompanied by his wife.  Patient continues to have cognitive decline.  He now cannot be left alone.  Patient has benefits through the [place] and now has 20 hours a week of sitter at home with wife gets some relief.  Patient was not able to tolerate Aricept due to side effects and it was discontinued.  Patient medications are supervised by the wife.  He is confused at baseline and disoriented.   He can follow directions and can be redirected.  He ambulates independently.  There is no delusions hallucinations, unsafe behavior or violent behavior noted.  He had EEG done on 11/12/2020 which was normal.  He was seen briefly in the ER on 01/06/2021 for transient confusion.  He has no new complaints today.  He still has not been complaining of numbness or pain in his hand.  His carpal tunnel symptoms seem to be stable and not bothersome at the moment. ROS:   14 system review of systems is positive for memory loss, confusion hand numbness, pain, decreased hearing only and all other systems negative  PMH:  Past Medical History:  Diagnosis Date   Acute encephalopathy 02/12/2018   CAP (community acquired pneumonia) 02/11/2018   Carpal tunnel syndrome, bilateral 02/25/2020   Coronary artery disease    Diabetes mellitus    Diverticulitis    Hypertension    Hypokalemia 02/12/2018    Social History:  Social History   Socioeconomic History   Marital status: Married    Spouse name: Alexander Donaldson   Number of children: Not on file   Years of education: Not on file   Highest education level: Not on file  Occupational History   Not on file  Tobacco Use   Smoking status: Never   Smokeless tobacco: Never  Substance and Sexual Activity   Alcohol use: No    Comment: social   Drug use: No   Sexual activity: Yes    Birth control/protection: None  Other Topics Concern   Not on file  Social History Narrative   Lives with wife   Right Handed   Drinks 1-2 cups caffeine daily   Social Determinants of Health   Financial Resource Strain: Not on file  Food Insecurity: Not on file  Transportation Needs: Not on file  Physical Activity: Not on file  Stress: Not on file  Social Connections: Not on file  Intimate Partner Violence: Not on file    Medications:   Current Outpatient Medications on File Prior to Visit  Medication Sig Dispense Refill   acetaminophen (TYLENOL) 500 MG tablet Take 500 mg by mouth  every 6 (six) hours as needed for moderate pain.     amLODipine (NORVASC) 10 MG tablet Take 5 mg by mouth 2 (two) times daily.     aspirin 81 MG tablet Take 81 mg by mouth daily.     atorvastatin (LIPITOR) 80 MG tablet Take 40 mg by mouth every evening.     clopidogrel (PLAVIX) 75 MG tablet Take 75 mg by mouth daily.  3   diclofenac sodium (VOLTAREN) 1 % GEL Apply 2 g topically 4 (four) times daily as needed (pain). 100 g 0   losartan (COZAAR) 100 MG tablet Take 100 mg by mouth daily.      metoprolol succinate (TOPROL-XL) 50 MG 24 hr tablet Take 50 mg by mouth  at bedtime. Take with or immediately following a meal.     Multiple Vitamin (MULTIVITAMIN) tablet Take 1 tablet by mouth daily.     nitroGLYCERIN (NITROSTAT) 0.4 MG SL tablet Place 1 tablet (0.4 mg total) under the tongue every 5 (five) minutes x 3 doses as needed for chest pain. 25 tablet 3   psyllium (HYDROCIL/METAMUCIL) 95 % PACK Take 1 packet by mouth 2 (two) times daily.      No current facility-administered medications on file prior to visit.    Allergies:   Allergies  Allergen Reactions   Lisinopril     Other reaction(s): Cough   Metformin Diarrhea    Physical Exam General: well developed, well nourished elderly African-American male, seated, in no evident distress Head: head normocephalic and atraumatic.   Neck: supple with no carotid or supraclavicular bruits Cardiovascular: regular rate and rhythm, no murmurs Musculoskeletal: no deformity Skin:  no rash/petichiae Vascular:  Normal pulses all extremities  Neurologic Exam Mental Status: Awake and fully alert. Oriented to place and time. Recent and remote memory diminished. Attention span, concentration and fund of knowledge poor. Mood and affect appropriate.  Mini-Mental status exam not done today. (Last visit 13/30 with deficits in orientation, attention, calculation, recall and following three-step commands.)  Diminished recall 0/3.  Able to name only 0 animals which  can walk on 4 legs.  Clock drawing 1/4.  He was not able to copy intersecting pentagons well.   Cranial Nerves: Fundoscopic exam not done pupils equal, briskly reactive to light. Extraocular movements full without nystagmus. Visual fields full to confrontation. Hearing diminished bilaterally facial sensation intact. Face, tongue, palate moves normally and symmetrically.  Motor: Normal bulk and tone. Normal strength in all tested extremity muscles.  Except wasting of both thenar eminences.  Mild weakness of abductor pollicis and extensor pollicis in the right hand. Sensory.:  diminished touch pinprick sensation in the right thumb and index finger only.  Positive weak Tinel sign on both wrists right greater than left.  Diminished vibration sensations over ankles bilaterally.  Romberg sign is weakly positive. Coordination: Rapid alternating movements normal in all extremities. Finger-to-nose and heel-to-shin performed accurately bilaterally. Gait and Station: Arises from chair without difficulty. Stance is normal. Gait demonstrates normal stride length and balance . Able to heel, toe and tandem walk with great difficulty.  Reflexes: 1+ and symmetric in upper extremities and both knee and ankle jerks are absent.. Toes downgoing.       ASSESSMENT: 87 year old African-American male with bilateral hand right greater than left weakness secondary to severe end-stage carpal tunnel syndrome.  Cervical radiculopathy and underlying diabtic neuropathy felt to be less likely.  Progressive subacute cognitive and memory decline likely due to Alzheimer's .  He has not been able to tolerate Aricept due to side effects    PLAN: I had a long discussion with patient and his wife regarding his progressive memory and cognitive worsening likely represents progressive dementia.  He has had trouble tolerating Aricept in the past hence agree with discontinuing it.  Trial of Namenda starter pack and increase as tolerated to 10  mg twice daily.  Continue 24-hour supervision at home.  Patient's carpal tunnel symptoms seem to be quite stable and recommend conservative follow-up.  Return follow-up in the future in 3 months or call earlier if necessary.Greater than 50% time during this prolonged 25-minute   visit was spent on counseling and coordination of care about his hand numbness and carpal tunnel as well as memory loss  and diagnosis of Alzheimer's and answering questions.   Delia Heady, MD  [facility] [address] [place], [place] [address]  Phone [phone] Fax [phone] Note: This document was prepared with digital dictation and possible smart phrase technology. Any transcriptional errors that result from this process are unintentional.

## 2021-05-31 ENCOUNTER — Telehealth (INDEPENDENT_AMBULATORY_CARE_PROVIDER_SITE_OTHER): Payer: Medicare Other | Admitting: Family Medicine

## 2021-05-31 ENCOUNTER — Telehealth: Payer: Self-pay

## 2021-05-31 ENCOUNTER — Other Ambulatory Visit: Payer: Self-pay

## 2021-05-31 ENCOUNTER — Encounter: Payer: Self-pay | Admitting: Family Medicine

## 2021-05-31 VITALS — BP 119/59 | HR 70 | Temp 98.7°F | Ht 63.0 in

## 2021-05-31 DIAGNOSIS — U071 COVID-19: Secondary | ICD-10-CM | POA: Diagnosis not present

## 2021-05-31 MED ORDER — MOLNUPIRAVIR EUA 200MG CAPSULE: 4.0000 | ORAL_CAPSULE | Freq: Two times a day (BID) | ORAL | 0 refills | Status: AC

## 2021-05-31 NOTE — Progress Notes (Signed)
      Alexander Donaldson Bilger, MD Primary Care and Sports Medicine [facility] [address] [place], [address] Phone: [phone]  FAX: [phone]  Alexander Donaldson - 87 y.o. male  MRN [id number]  Date of Birth: 06/25/1934  Visit Date: 05/31/2021  PCP: Doreene Nest, NP  Referred by: Doreene Nest, NP  Virtual Visit via Video Note:  I connected with  Alexander Donaldson on 05/31/2021 11:40 AM EST by a video enabled telemedicine application and verified that I am speaking with the correct person using two identifiers.   Location patient: home computer, tablet, or smartphone Location provider: work or home office Consent: Verbal consent directly obtained from [employer]. Persons participating in the virtual visit: patient, provider  I discussed the limitations of evaluation and management by telemedicine and the availability of in person appointments. The patient expressed understanding and agreed to proceed.  Interactive audio and video telecommunications were attempted between this provider and patient, however failed, due to patient having technical difficulties OR patient did not have access to video capability.  We continued and completed visit with audio only.    Chief Complaint  Patient presents with   Covid Positive    [phone] -Positive Home test yesterday    History of Present Illness:  Globally, the patient does not feel all that bad, but he did have a positive COVID test yesterday.  He is having a cough, and some congestion. He also denies any nausea, vomiting, diarrhea.  He is eating and drinking okay. He does not have a fever.  Sat cough and during the day No temp  Feels ok. Coughing now No congestion   Review of Systems as above: See pertinent positives and pertinent negatives per HPI No acute distress verbally   Observations/Objective/Exam:  An attempt was made to discern vital signs over the phone  and per patient if applicable and possible.   General:    Alert, Oriented, appears well and in no acute distress  Pulmonary:     On inspection no signs of respiratory distress.  Psych / Neurological:     Pleasant and cooperative.  Assessment and Plan:    ICD-10-CM   1. COVID-19  U07.1      Total encounter time: 10 minutes. This includes total time spent on the day of encounter.   COVID, relatively he feels good, but he is age 87 with multiple risk factors including age, CAD, HTN, DM.  Add antivirals, precautions reviewed.  Supportive care.  I discussed the assessment and treatment plan with the patient. The patient was provided an opportunity to ask questions and all were answered. The patient agreed with the plan and demonstrated an understanding of the instructions.   The patient was advised to call back or seek an in-person evaluation if the symptoms worsen or if the condition fails to improve as anticipated.  Follow-up: prn unless noted otherwise below No follow-ups on file.  Meds ordered this encounter  Medications   molnupiravir EUA (LAGEVRIO) 200 mg CAPS capsule    Sig: Take 4 capsules (800 mg total) by mouth 2 (two) times daily for 5 days.    Dispense:  40 capsule    Refill:  0   No orders of the defined types were placed in this encounter.   Signed,  Elpidio Galea. Shamiya Demeritt, MD

## 2021-05-31 NOTE — Telephone Encounter (Signed)
[facility] Night - Client TELEPHONE ADVICE RECORD AccessNurse Patient Name: Alexander Donaldson Gender: Male DOB: 16-Sep-1933 Age: 87 Y 5 M 11 D Return Phone Number: [phone] (Primary) Address: City/ State/ Zip: [place]  [address] Client [facility] Night - Client Client Site [facility] - Night Provider Vernona Rieger - NP Contact Type Call Who Is Calling Patient / Member / Family / Caregiver Call Type Triage / Clinical Caller Name Pritesh Sobecki Relationship To Patient Spouse Return Phone Number [phone] (Primary) Chief Complaint Headache Reason for Call Symptomatic / Request for Health Information Initial Comment 2/2 Caller and her husband are covid positive - fever, headache, and cough. Nurse Assessment Nurse: Files, RN, Dustin Date/Time Lamount Cohen Time): 05/30/2021 5:12:58 PM Confirm and document reason for call. If symptomatic, describe symptoms. ---Caller states both her and husband tested positive for Covid today, temp 99.7 oral, slight cough, started yesterday Does the patient have any new or worsening symptoms? ---Yes Will a triage be completed? ---Yes Related visit to physician within the last 2 weeks? ---No Does the PT have any chronic conditions? (i.e. diabetes, asthma, this includes High risk factors for pregnancy, etc.) ---Yes List chronic conditions. ---6 stents, DM, heart issues Is this a behavioral health or substance abuse call? ---No Guidelines Guideline Title Affirmed Question Affirmed Notes Nurse Date/Time (Eastern Time) COVID-19 - Diagnosed or Suspected [1] HIGH RISK for severe COVID complications (e.g., weak immune system, age > 64 years, obesity with BMI 30 or higher, pregnant, chronic lung disease or other chronic medical condition) AND [2] Files, RN, Amalia Hailey 05/30/2021 5:15:30 PM PLEASE NOTE: All timestamps contained within this report are represented as  [place] Standard Time. CONFIDENTIALTY NOTICE: This fax transmission is intended only for the addressee. It contains information that is legally privileged, confidential or otherwise protected from use or disclosure. If you are not the intended recipient, you are strictly prohibited from reviewing, disclosing, copying using or disseminating any of this information or taking any action in reliance on or regarding this information. If you have received this fax in error, please notify us immediately by telephone so that we can arrange for its return to [place]. Phone: [phone], Toll-Free: [phone], Fax: [phone] Page: 2 of 2 Call Id: [id number] Guidelines Guideline Title Affirmed Question Affirmed Notes Nurse Date/Time Lamount Cohen Time) COVID symptoms (e.g., cough, fever) (Exceptions: Already seen by PCP and no new or worsening symptoms.) Disp. Time Lamount Cohen Time) Disposition Final User 05/30/2021 5:22:19 PM Call PCP within 24 Hours Yes Files, RN, Amalia Hailey Caller Disagree/Comply Comply Caller Understands Yes PreDisposition InappropriateToAsk Care Advice Given Per Guideline * You need to discuss this with your doctor (or NP/PA) within the next 24 hours. CALL PCP WITHIN 24 HOURS: CALL BACK IF: * You become worse CARE ADVICE given per COVID-19 - DIAGNOSED OR SUSPECTED (Adult) guideline. Referrals REFERRED TO PCP OFFIC

## 2021-05-31 NOTE — Telephone Encounter (Signed)
Per appt notes pt already scheduled for video visit with Dr Patsy Lager 05/31/21 at 11:40. Per access nurse note care advice given if pt condition worsens. Sending note to DR Copland,Donna CMA and FYI to PCP, Allayne Gitelman NP. Will also teams Lupita Leash.

## 2021-06-01 NOTE — Telephone Encounter (Signed)
Noted and appreciate Dr. Copland's evaluation 

## 2021-06-21 DIAGNOSIS — I5089 Other heart failure: Secondary | ICD-10-CM | POA: Diagnosis not present

## 2021-06-21 DIAGNOSIS — R069 Unspecified abnormalities of breathing: Secondary | ICD-10-CM | POA: Diagnosis not present

## 2021-07-22 DIAGNOSIS — I5089 Other heart failure: Secondary | ICD-10-CM | POA: Diagnosis not present

## 2021-07-22 DIAGNOSIS — R069 Unspecified abnormalities of breathing: Secondary | ICD-10-CM | POA: Diagnosis not present

## 2021-08-22 DIAGNOSIS — I5089 Other heart failure: Secondary | ICD-10-CM | POA: Diagnosis not present

## 2021-08-22 DIAGNOSIS — R069 Unspecified abnormalities of breathing: Secondary | ICD-10-CM | POA: Diagnosis not present

## 2021-08-25 DIAGNOSIS — H2513 Age-related nuclear cataract, bilateral: Secondary | ICD-10-CM | POA: Diagnosis not present

## 2021-08-25 DIAGNOSIS — H524 Presbyopia: Secondary | ICD-10-CM | POA: Diagnosis not present

## 2021-08-25 DIAGNOSIS — E113293 Type 2 diabetes mellitus with mild nonproliferative diabetic retinopathy without macular edema, bilateral: Secondary | ICD-10-CM | POA: Diagnosis not present

## 2021-08-25 DIAGNOSIS — H5203 Hypermetropia, bilateral: Secondary | ICD-10-CM | POA: Diagnosis not present

## 2021-08-25 LAB — HM DIABETES EYE EXAM

## 2021-09-02 ENCOUNTER — Ambulatory Visit: Payer: Medicare Other | Admitting: Sports Medicine

## 2021-09-02 ENCOUNTER — Encounter: Payer: Self-pay | Admitting: Sports Medicine

## 2021-09-02 ENCOUNTER — Other Ambulatory Visit: Payer: Self-pay

## 2021-09-02 DIAGNOSIS — E1159 Type 2 diabetes mellitus with other circulatory complications: Secondary | ICD-10-CM | POA: Diagnosis not present

## 2021-09-02 DIAGNOSIS — M21372 Foot drop, left foot: Secondary | ICD-10-CM

## 2021-09-02 DIAGNOSIS — M79675 Pain in left toe(s): Secondary | ICD-10-CM | POA: Diagnosis not present

## 2021-09-02 DIAGNOSIS — F0390 Unspecified dementia without behavioral disturbance: Secondary | ICD-10-CM | POA: Diagnosis not present

## 2021-09-02 DIAGNOSIS — B351 Tinea unguium: Secondary | ICD-10-CM

## 2021-09-02 DIAGNOSIS — M79674 Pain in right toe(s): Secondary | ICD-10-CM

## 2021-09-02 NOTE — Progress Notes (Signed)
Subjective: Alexander Donaldson is a 88 y.o. male patient with history of diabetes who presents to office today for a diabetic foot exam and complaining of long,mildly painful nails  while ambulating in shoes; unable to trim. Patient assisted by wife who reports that they did not check his blood sugar and his last A1c was 7.5 reports that he goes to Dr. Antony Madura at the [facility] and also sees Dr. Chestine Spore last visit was June 2022.   Patient Active Problem List   Diagnosis Date Noted   Carpal tunnel syndrome, bilateral 02/25/2020   Numbness 11/28/2019   Raynaud's phenomenon 10/30/2019   Side pain 09/13/2018   CAD (coronary artery disease) 02/12/2018   Left foot drop 02/12/2018   Type 2 diabetes mellitus with vascular disease (HCC) 02/12/2018   Essential hypertension 02/12/2018   Elevated LFTs 02/12/2018   Nausea & vomiting 02/12/2018   Current Outpatient Medications on File Prior to Visit  Medication Sig Dispense Refill   acetaminophen (TYLENOL) 500 MG tablet Take 500 mg by mouth every 6 (six) hours as needed for moderate pain.     Alogliptin Benzoate 12.5 MG TABS TAKE ONE TABLET BY MOUTH EVERY DAY FOR DIABETES *REPLACES GLIPIZIDE*     amLODipine (NORVASC) 10 MG tablet Take 5 mg by mouth 2 (two) times daily.     aspirin 81 MG tablet Take 81 mg by mouth daily.     atorvastatin (LIPITOR) 80 MG tablet Take 40 mg by mouth every evening.     clopidogrel (PLAVIX) 75 MG tablet Take 75 mg by mouth daily.  3   diclofenac sodium (VOLTAREN) 1 % GEL Apply 2 g topically 4 (four) times daily as needed (pain). 100 g 0   losartan (COZAAR) 100 MG tablet Take 100 mg by mouth daily.      memantine (NAMENDA TITRATION PAK) tablet pack 5 mg/day for =1 week; 5 mg twice daily for =1 week; 15 mg/day given in 5 mg and 10 mg separated doses for =1 week; then 10 mg twice daily (Patient not taking: Reported on 05/31/2021) 49 tablet 12   memantine (NAMENDA) 10 MG tablet Take 1 tablet (10 mg total) by mouth 2 (two) times daily. Start only  after first finishing starter pack (Patient not taking: Reported on 05/31/2021) 60 tablet 3   metoprolol succinate (TOPROL-XL) 50 MG 24 hr tablet Take 50 mg by mouth at bedtime. Take with or immediately following a meal.     Multiple Vitamin (MULTIVITAMIN) tablet Take 1 tablet by mouth daily.     nitroGLYCERIN (NITROSTAT) 0.4 MG SL tablet Place 1 tablet (0.4 mg total) under the tongue every 5 (five) minutes x 3 doses as needed for chest pain. 25 tablet 3   psyllium (HYDROCIL/METAMUCIL) 95 % PACK Take 1 packet by mouth 2 (two) times daily.      No current facility-administered medications on file prior to visit.   Allergies  Allergen Reactions   Lisinopril     Other reaction(s): Cough   Metformin Diarrhea    No results found for this or any previous visit (from the past 2160 hour(s)).  Objective: General: Patient is awake, alert, and oriented x 3 and in no acute distress.  Integument: Skin is warm, dry and supple bilateral. Nails are tender, extremely long, thickened and dystrophic with subungual debris, consistent with onychomycosis, 1-5 bilateral. No signs of infection. No open lesions or preulcerative lesions present bilateral. Remaining integument unremarkable.  Vasculature:  Dorsalis Pedis pulse 1/4 bilateral. Posterior Tibial pulse 0/4  bilateral.  Capillary fill time <5 sec 1-5 bilateral.  Scant hair growth to the level of the digits. Temperature gradient within normal limits. No varicosities present bilateral. No edema present bilateral.   Neurology: The patient has diminished sensation measured with a 5.07/10g Semmes Weinstein Monofilament at all pedal sites bilateral . Vibratory sensation diminished bilateral with tuning fork. No Babinski sign present bilateral.   Musculoskeletal:  Asymptomatic hammertoe and left foot drop, functional not braced, no other pedal deformities noted bilateral.   Assessment and Plan: Problem List Items Addressed This Visit       Cardiovascular  and Mediastinum   Type 2 diabetes mellitus with vascular disease (HCC)     Other   Left foot drop   Other Visit Diagnoses     Pain due to onychomycosis of toenails of both feet    -  Primary   Dementia, unspecified dementia severity, unspecified dementia type, unspecified whether behavioral, psychotic, or mood disturbance or anxiety (HCC)           -Examined patient. -Discussed and educated patient and wife on diabetic foot care, especially with  regards to the vascular, neurological and musculoskeletal systems.  -Stressed the importance of good glycemic control and the detriment of not  controlling glucose levels in relation to the foot. -Mechanically debrided all nails 1-5 bilateral using sterile nail nipper and filed with dremel without incident  -Advised wife that she can help him occasionally with vinegar soaking for nail discoloration/fungus -Answered all patient questions -Patient to return  in 3 months for at risk foot care -Patient advised to call the office if any problems or questions arise in the meantime.  Asencion Islam, DPM

## 2021-09-29 ENCOUNTER — Other Ambulatory Visit: Payer: Self-pay

## 2021-09-29 ENCOUNTER — Encounter: Payer: Self-pay | Admitting: Neurology

## 2021-09-29 ENCOUNTER — Ambulatory Visit: Payer: Medicare Other | Admitting: Neurology

## 2021-09-29 VITALS — BP 122/72 | HR 59 | Ht 64.0 in | Wt 164.0 lb

## 2021-09-29 DIAGNOSIS — Z8669 Personal history of other diseases of the nervous system and sense organs: Secondary | ICD-10-CM

## 2021-09-29 DIAGNOSIS — R413 Other amnesia: Secondary | ICD-10-CM

## 2021-09-29 DIAGNOSIS — G309 Alzheimer's disease, unspecified: Secondary | ICD-10-CM

## 2021-09-29 DIAGNOSIS — F0280 Dementia in other diseases classified elsewhere without behavioral disturbance: Secondary | ICD-10-CM | POA: Diagnosis not present

## 2021-09-29 NOTE — Patient Instructions (Signed)
I had a long discussion with the patient and his wife regarding his progressive memory and cognitive worsening from dementia which seems somewhat stable after starting Namenda.  Continue Namenda 10 mg twice daily which is tolerating well.  Continue 24-hour supervision at home and if his condition worsens he may need to look into moving into memory care unit or nursing home.  His carpal tunnel symptoms seem quite stable at the present time hence recommend conservative follow-up for that.  Return for follow-up in the future in 6 months or call earlier if necessary ?

## 2021-09-29 NOTE — Progress Notes (Signed)
?[facility] ?[address] ?[place] [address] ?[phone] ? ?     OFFICE FOLLOW UP VISIT NOTE ? ?[name] ?Date of Birth:  May 17, 1934 ?Medical Record Number:  [id number]  ? ?Referring MD: Loma Boston, NP ?Reason for Referral: Hand numbness ?HPI: Initial visit 01/20/2020: Alexander Donaldson is a pleasant 88 year old African-American male with past medical history of diabetes, hypertension, diverticulitis, coronary artery disease who has been complaining of bilateral hand numbness for the last 6 months.  Reports it is intermittently but most prominently at night or early in the morning when he wakes up with his right thumb and index finger feeling stiff and numb.  He feels his strength is good and he can still hold objects very well but his fingers feel different.  This is often severe at night and he often wakes up with this.  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  He has not been wearing any wrist splints.  Denies any neck pain, radicular pain, gait or balance problems.  Review of electronic medical records show that he had lab work on 11/28/2019 which include vitamin B12 which was 284 mg percent and hemoglobin A1c was elevated at 8.5.  Comprehensive metabolic panel labs were normal.  TSH was 1.91 on 01/22/2019.  She has not had any x-rays of his C-spine, MRI or EMG nerve conduction studies done. ?Update 03/31/2020: He returns for follow-up after last visit 2 months ago.  He continues to have weakness in his hand but states that the numbness is only in the index and middle fingertips and is not as bothersome.  EMG nerve conduction study done by Dr. Jannifer Franklin on 02/25/2020 confirmed severe end-stage carpal tunnel bilaterally right more than left with wasting and no activity in the right abductor pollicis on stimulation.  There is also chronic stable overlying C5 radiculopathy.  Patient's wife reports  today that for the last year or so she is noted progressive cognitive decline.  Recent short-term memory difficulties.  Is also had some behavioral changes and gets irritable and agitated easily.  Is quite forgetful.  Will not follow multistage commands and often gets distracted easily.  Also cannot have a proper conversation at times gets sidetracked.  She has been doing and more things for him.  Patient has no family history of Alzheimer's.  He has no prior history of strokes, TIA, seizures or head injury with loss of consciousness.  He has not had any recent brain imaging done. ?Update 09/16/2020: He returns for follow-up after last visit 6 months ago.  He is accompanied by his wife.  Patient had lab work at last visit for reversible causes of memory loss which was all normal.  Patient refused to undergo EEG and MRI which were ordered.  He did try the Namenda but was not able to tolerate 5 mg twice daily and developed significant sleepiness and hence it was stopped after a few weeks.  He continues to have significant memory and cognitive difficulties.  He requires constant supervision and his wife is around him most of the time.  Patient did see Dr. Christella Noa for his severe carpal tunnel with he refused to have surgery.  Continues to have numbness and tingling in his hands intermittently but still has good strength.  Patient of likely had an MRI done at the [place] in August 2020 and his wife  states she is trying to get me the report of the disc to review. ?Update 05/24/2021 : He returns for follow-up after last visit 8 months ago.  Is accompanied by his wife.  Patient continues to have cognitive decline.  He now cannot be left alone.  Patient has benefits through the [place] and now has 20 hours a week of sitter at home with wife gets some relief.  Patient was not able to tolerate Aricept due to side effects and it was discontinued.  Patient medications are supervised by the wife.  He is confused at baseline and disoriented.   He can follow directions and can be redirected.  He ambulates independently.  There is no delusions hallucinations, unsafe behavior or violent behavior noted.  He had EEG done on 11/12/2020 which was normal.  He was seen briefly in the ER on 01/06/2021 for transient confusion.  He has no new complaints today.  He still has not been complaining of numbness or pain in his hand.  His carpal tunnel symptoms seem to be stable and not bothersome at the moment. ?Update 09/29/2021 : He returns for follow-up after last visit 4 months ago.  He is accompanied by his wife.  He seems to be tolerating Namenda better than he did Aricept.  Wife has noticed slight improvement in his cognition and times he speaks better and makes more sense.  Short-term memory also at times seems improved.  Patient continues to talk a lot about his past and has trouble making new information or retaining information.  He is mostly calm though occasionally gets irritated.  He denies any delirium, hallucinations or violent behavior.  He now has a caregiver support at home for 20 hours/week through the [place] system.  His children also help out.  His primary care physician recently started new diabetic medication.  He had lab work done at the [facility] on 08/19/2021 and LDL cholesterol was 103 mg percent and hemoglobin A1c was 7.6. ?ROS:   ?14 system review of systems is positive for memory loss, confusion hand numbness, pain, decreased hearing only and all other systems negative ? ?PMH:  ?Past Medical History:  ?Diagnosis Date  ? Acute encephalopathy 02/12/2018  ? CAP (community acquired pneumonia) 02/11/2018  ? Carpal tunnel syndrome, bilateral 02/25/2020  ? Coronary artery disease   ? Diabetes mellitus   ? Diverticulitis   ? Hypertension   ? Hypokalemia 02/12/2018  ? ? ?Social History:  ?Social History  ? ?Socioeconomic History  ? Marital status: Married  ?  Spouse name: Santiago Glad  ? Number of children: Not on file  ? Years of education: Not on file  ? Highest  education level: Not on file  ?Occupational History  ? Not on file  ?Tobacco Use  ? Smoking status: Never  ? Smokeless tobacco: Never  ?Substance and Sexual Activity  ? Alcohol use: No  ?  Comment: social  ? Drug use: No  ? Sexual activity: Yes  ?  Birth control/protection: None  ?Other Topics Concern  ? Not on file  ?Social History Narrative  ? Lives with wife  ? Right Handed  ? Drinks 1-2 cups caffeine daily  ? ?Social Determinants of Health  ? ?Financial Resource Strain: Not on file  ?Food Insecurity: Not on file  ?Transportation Needs: Not on file  ?Physical Activity: Not on file  ?Stress: Not on file  ?Social Connections: Not on file  ?Intimate Partner Violence: Not on file  ? ? ?Medications:   ?Current  Outpatient Medications on File Prior to Visit  ?Medication Sig Dispense Refill  ? acetaminophen (TYLENOL) 500 MG tablet Take 500 mg by mouth every 6 (six) hours as needed for moderate pain.    ? Alogliptin Benzoate 12.5 MG TABS TAKE ONE TABLET BY MOUTH EVERY DAY FOR DIABETES *REPLACES GLIPIZIDE*    ? amLODipine (NORVASC) 10 MG tablet Take 5 mg by mouth 2 (two) times daily.    ? aspirin 81 MG tablet Take 81 mg by mouth daily.    ? atorvastatin (LIPITOR) 80 MG tablet Take 40 mg by mouth every evening.    ? clopidogrel (PLAVIX) 75 MG tablet Take 75 mg by mouth daily.  3  ? diclofenac sodium (VOLTAREN) 1 % GEL Apply 2 g topically 4 (four) times daily as needed (pain). 100 g 0  ? losartan (COZAAR) 100 MG tablet Take 100 mg by mouth daily.     ? memantine (NAMENDA) 10 MG tablet Take 1 tablet (10 mg total) by mouth 2 (two) times daily. Start only after first finishing starter pack 60 tablet 3  ? metoprolol succinate (TOPROL-XL) 50 MG 24 hr tablet Take 50 mg by mouth at bedtime. Take with or immediately following a meal.    ? Multiple Vitamin (MULTIVITAMIN) tablet Take 1 tablet by mouth daily.    ? psyllium (HYDROCIL/METAMUCIL) 95 % PACK Take 1 packet by mouth 2 (two) times daily.     ? nitroGLYCERIN (NITROSTAT) 0.4 MG  SL tablet Place 1 tablet (0.4 mg total) under the tongue every 5 (five) minutes x 3 doses as needed for chest pain. 25 tablet 3  ? ?No current facility-administered medications on file prior to visit.  ? ? ?Al

## 2021-10-04 DIAGNOSIS — I480 Paroxysmal atrial fibrillation: Secondary | ICD-10-CM | POA: Diagnosis not present

## 2021-10-04 DIAGNOSIS — E785 Hyperlipidemia, unspecified: Secondary | ICD-10-CM | POA: Diagnosis not present

## 2021-10-04 DIAGNOSIS — I2510 Atherosclerotic heart disease of native coronary artery without angina pectoris: Secondary | ICD-10-CM | POA: Diagnosis not present

## 2021-10-04 DIAGNOSIS — I10 Essential (primary) hypertension: Secondary | ICD-10-CM | POA: Diagnosis not present

## 2021-10-04 DIAGNOSIS — I255 Ischemic cardiomyopathy: Secondary | ICD-10-CM | POA: Diagnosis not present

## 2021-10-04 DIAGNOSIS — E1169 Type 2 diabetes mellitus with other specified complication: Secondary | ICD-10-CM | POA: Diagnosis not present

## 2021-11-30 ENCOUNTER — Emergency Department (HOSPITAL_COMMUNITY)
Admission: EM | Admit: 2021-11-30 | Discharge: 2021-11-30 | Disposition: A | Discharge status: Home / Self Care | Payer: No Typology Code available for payment source | Attending: Emergency Medicine | Admitting: Emergency Medicine

## 2021-11-30 ENCOUNTER — Emergency Department (HOSPITAL_COMMUNITY): Payer: No Typology Code available for payment source

## 2021-11-30 ENCOUNTER — Other Ambulatory Visit: Payer: Self-pay

## 2021-11-30 DIAGNOSIS — E119 Type 2 diabetes mellitus without complications: Secondary | ICD-10-CM | POA: Diagnosis not present

## 2021-11-30 DIAGNOSIS — Z7902 Long term (current) use of antithrombotics/antiplatelets: Secondary | ICD-10-CM | POA: Insufficient documentation

## 2021-11-30 DIAGNOSIS — I10 Essential (primary) hypertension: Secondary | ICD-10-CM | POA: Diagnosis not present

## 2021-11-30 DIAGNOSIS — Z7982 Long term (current) use of aspirin: Secondary | ICD-10-CM | POA: Diagnosis not present

## 2021-11-30 DIAGNOSIS — Z79899 Other long term (current) drug therapy: Secondary | ICD-10-CM | POA: Diagnosis not present

## 2021-11-30 DIAGNOSIS — R079 Chest pain, unspecified: Secondary | ICD-10-CM | POA: Diagnosis present

## 2021-11-30 DIAGNOSIS — G309 Alzheimer's disease, unspecified: Secondary | ICD-10-CM | POA: Diagnosis not present

## 2021-11-30 DIAGNOSIS — I2510 Atherosclerotic heart disease of native coronary artery without angina pectoris: Secondary | ICD-10-CM | POA: Diagnosis not present

## 2021-11-30 DIAGNOSIS — F0390 Unspecified dementia without behavioral disturbance: Secondary | ICD-10-CM

## 2021-11-30 LAB — TROPONIN I (HIGH SENSITIVITY)
Troponin I (High Sensitivity): 11 ng/L (ref <18)
Troponin I (High Sensitivity): 13 ng/L (ref <18)

## 2021-11-30 LAB — CBC
HCT: 42.6 % (ref 39.0–52.0)
Hemoglobin: 14.3 g/dL (ref 13.0–17.0)
MCH: 31 pg (ref 26.0–34.0)
MCHC: 33.6 g/dL (ref 30.0–36.0)
MCV: 92.4 fL (ref 80.0–100.0)
Platelets: 165 10*3/uL (ref 150–400)
RBC: 4.61 MIL/uL (ref 4.22–5.81)
RDW: 12.9 % (ref 11.5–15.5)
WBC: 5.4 10*3/uL (ref 4.0–10.5)
nRBC: 0 % (ref 0.0–0.2)

## 2021-11-30 LAB — BASIC METABOLIC PANEL
Anion gap: 11 (ref 5–15)
BUN: 15 mg/dL (ref 8–23)
CO2: 23 mmol/L (ref 22–32)
Calcium: 8.8 mg/dL — ABNORMAL LOW (ref 8.9–10.3)
Chloride: 103 mmol/L (ref 98–111)
Creatinine, Ser: 1.05 mg/dL (ref 0.61–1.24)
GFR, Estimated: >60 mL/min (ref >60)
Glucose, Bld: 175 mg/dL — ABNORMAL HIGH (ref 70–99)
Potassium: 3.5 mmol/L (ref 3.5–5.1)
Sodium: 137 mmol/L (ref 135–145)

## 2021-11-30 MED ORDER — ASPIRIN 81 MG PO CHEW
324.0000 mg | CHEWABLE_TABLET | Freq: Once | ORAL | Status: AC
  Administered 2021-11-30: 324 mg via ORAL
  Filled 2021-11-30: qty 4

## 2021-11-30 NOTE — Discharge Instructions (Signed)
The test results today are reassuring.  Follow-up with your cardiologist as planned.  Return as needed for recurrent symptoms

## 2021-11-30 NOTE — ED Triage Notes (Signed)
Pt has hx of dementia. Pt aggressive w/ staff.

## 2021-11-30 NOTE — ED Provider Notes (Signed)
[facility] EMERGENCY DEPARTMENT Provider Note   CSN: [id number] Arrival date & time: 11/30/21  2353     History  Chief Complaint  Patient presents with   Chest Pain    Alexander Donaldson is a 88 y.o. male.   Chest Pain Associated symptoms: no fatigue    Patient has history of coronary artery disease, diabetes, hypertension, Alzheimer's dementia who presents to the ED with complaints of chest pain.  According to the EMS reports the spouse called 911 because the patient started complaining of chest pain last evening.  Patient in the ED denies having any complaints.  He does not recall having any chest pain.  He is not sure why he is here.  He is not having any chest pain currently.  No shortness of breath.  No abdominal pain.  No vomiting or diarrhea.  Home Medications Prior to Admission medications   Medication Sig Start Date End Date Taking? Authorizing Provider  acetaminophen (TYLENOL) 500 MG tablet Take 500 mg by mouth every 6 (six) hours as needed for moderate pain.    [provider]  Alogliptin Benzoate 12.5 MG TABS TAKE ONE TABLET BY MOUTH EVERY DAY FOR DIABETES *REPLACES GLIPIZIDE* 03/30/21   [provider]  amLODipine (NORVASC) 10 MG tablet Take 5 mg by mouth 2 (two) times daily.    [provider]  aspirin 81 MG tablet Take 81 mg by mouth daily.    [provider]  atorvastatin (LIPITOR) 80 MG tablet Take 40 mg by mouth every evening.    [provider]  clopidogrel (PLAVIX) 75 MG tablet Take 75 mg by mouth daily. 01/02/17   [provider]  diclofenac sodium (VOLTAREN) 1 % GEL Apply 2 g topically 4 (four) times daily as needed (pain). 11/01/18   Melene Plan, DO  losartan (COZAAR) 100 MG tablet Take 100 mg by mouth daily.     [provider]  memantine (NAMENDA) 10 MG tablet Take 1 tablet (10 mg total) by mouth 2 (two) times daily. Start only after first finishing starter pack 05/24/21   Micki Riley, MD  metoprolol succinate (TOPROL-XL) 50 MG 24 hr tablet Take 50 mg by mouth at bedtime. Take with or immediately following a meal.    [provider]  Multiple Vitamin (MULTIVITAMIN) tablet Take 1 tablet by mouth daily.    [provider]  nitroGLYCERIN (NITROSTAT) 0.4 MG SL tablet Place 1 tablet (0.4 mg total) under the tongue every 5 (five) minutes x 3 doses as needed for chest pain. 03/18/12 01/06/21  Rinaldo Cloud, MD  psyllium (HYDROCIL/METAMUCIL) 95 % PACK Take 1 packet by mouth 2 (two) times daily.     [provider]      Allergies    Lisinopril and Metformin    Review of Systems   Review of Systems  Constitutional:  Negative for fatigue.  Cardiovascular:  Positive for chest pain.   Physical Exam Updated Vital Signs BP (!) 151/90   Pulse 69   Temp 97.7 F (36.5 C)   Resp 18   Ht 1.626 m (5\' 4" )   Wt 74.4 kg   SpO2 98%   BMI 28.15 kg/m  Physical Exam Vitals and nursing note reviewed.  Constitutional:      General: He is not in acute distress.    Appearance: He is well-developed.  HENT:     Head: Normocephalic and atraumatic.     Right Ear: External ear normal.  Left Ear: External ear normal.  Eyes:     General: No scleral icterus.       Right eye: No discharge.        Left eye: No discharge.     Conjunctiva/sclera: Conjunctivae normal.  Neck:     Trachea: No tracheal deviation.  Cardiovascular:     Rate and Rhythm: Normal rate and regular rhythm.  Pulmonary:     Effort: Pulmonary effort is normal. No respiratory distress.     Breath sounds: Normal breath sounds. No stridor. No wheezing or rales.  Abdominal:     General: Bowel sounds are normal. There is no distension.     Palpations: Abdomen is soft.     Tenderness: There is no abdominal tenderness. There is no guarding or rebound.  Musculoskeletal:        General: No tenderness or deformity.     Cervical back: Neck supple.  Skin:    General: Skin is warm and dry.      Findings: No rash.  Neurological:     General: No focal deficit present.     Mental Status: He is alert.     Cranial Nerves: No cranial nerve deficit (no facial droop, extraocular movements intact, no slurred speech).     Sensory: No sensory deficit.     Motor: No abnormal muscle tone or seizure activity.     Coordination: Coordination normal.  Psychiatric:        Mood and Affect: Mood normal.    ED Results / Procedures / Treatments   Labs (all labs ordered are listed, but only abnormal results are displayed) Labs Reviewed  BASIC METABOLIC PANEL - Abnormal; Notable for the following components:      Result Value   Glucose, Bld 175 (*)    Calcium 8.8 (*)    All other components within normal limits  CBC  CBG MONITORING, ED  TROPONIN I (HIGH SENSITIVITY)  TROPONIN I (HIGH SENSITIVITY)    EKG EKG Interpretation  Date/Time:  Tuesday Nov 30 2021 09:09:56 EDT Ventricular Rate:  65 PR Interval:  146 QRS Duration: 99 QT Interval:  380 QTC Calculation: 396 R Axis:   -15 Text Interpretation: Sinus rhythm Ventricular bigeminy , new since last tracing Borderline left axis deviation Nonspecific T abnormalities, lateral leads Confirmed by Linwood DibblesKnapp, Sharmane Dame [phone]([id number]) on 11/30/2021 9:20:07 AM  Radiology DG Chest Portable 1 View  Result Date: 11/30/2021 CLINICAL DATA:  New onset chest pain. EXAM: PORTABLE CHEST 1 VIEW COMPARISON:  01/06/2021 FINDINGS: Poor inspiration. Allowing for that, the heart and mediastinum are normal. The aorta does show tortuosity. The lungs are clear. The vascularity is normal. No effusions. Probable chronic rotator cuff tear on the right. IMPRESSION: Poor inspiration.  No active cardiopulmonary disease suspected. Electronically Signed   By: Paulina FusiMark  Shogry M.D.   On: 11/30/2021 09:29    Procedures Procedures    Medications Ordered in ED Medications  aspirin chewable tablet 324 mg (324 mg Oral Given 11/30/21 0955)    ED Course/ Medical Decision Making/ A&P Clinical  Course as of 11/30/21 1252  Tue Nov 30, 2021  1117 Troponin I (High Sensitivity) Initial troponin normal [JK]  1118 CBC Normal [JK]  1118 Basic metabolic panel(!) Normal [JK]  1245 Troponin I (High Sensitivity) Serial troponins normal [JK]  1245 Basic metabolic panel(!) Normal [JK]    Clinical Course User Index [JK] Linwood DibblesKnapp, Avram Danielson, MD  Medical Decision Making Problems Addressed: Dementia, unspecified dementia severity, unspecified dementia type, unspecified whether behavioral, psychotic, or mood disturbance or anxiety (HCC): chronic illness or injury Nonspecific chest pain: complicated acute illness or injury  Amount and/or Complexity of Data Reviewed External Data Reviewed: notes.    Details: Prior neurology notes reviewed Labs: ordered. Decision-making details documented in ED Course. Radiology: ordered and independent interpretation performed.  Risk OTC drugs.   Patient presented to ED for evaluation of chest pain.  Patient does have history of coronary artery disease.  ED work-up is reassuring.  Patient is not having any pain right now.  Serial troponins are normal.  I doubt acute coronary syndrome at this time.  He is breathing easily.  No tachycardia.  No shortness of breath.  Doubt Pe, pneumonia.  Etiology of symptoms unclear but appears appropriate for close outpatient follow-up with his cardiologist.  Warning signs precautions discussed.  Evaluation and diagnostic testing in the emergency department does not suggest an emergent condition requiring admission or immediate intervention beyond what has been performed at this time.  The patient is safe for discharge and has been instructed to return immediately for worsening symptoms, change in symptoms or any other concerns.         Final Clinical Impression(s) / ED Diagnoses Final diagnoses:  Nonspecific chest pain  Dementia, unspecified dementia severity, unspecified dementia type, unspecified  whether behavioral, psychotic, or mood disturbance or anxiety [facility])    Rx / DC Orders ED Discharge Orders     None         Linwood Dibbles, MD 11/30/21 1254

## 2021-11-30 NOTE — ED Notes (Signed)
Reviewed discharge instructions with patient and family. Follow-up care reviewed. Patient and family verbalized understanding. Patient VSS, and ambulatory with steady gait upon discharge.

## 2021-11-30 NOTE — ED Triage Notes (Signed)
BIB [facility] after pt's spouse called to report that pt had new onset c/p last evening into this AM. Pt denies pain at this time.   HX: MI

## 2021-12-01 DIAGNOSIS — E785 Hyperlipidemia, unspecified: Secondary | ICD-10-CM | POA: Diagnosis not present

## 2021-12-01 DIAGNOSIS — I25118 Atherosclerotic heart disease of native coronary artery with other forms of angina pectoris: Secondary | ICD-10-CM | POA: Diagnosis not present

## 2021-12-01 DIAGNOSIS — E1169 Type 2 diabetes mellitus with other specified complication: Secondary | ICD-10-CM | POA: Diagnosis not present

## 2021-12-01 DIAGNOSIS — I255 Ischemic cardiomyopathy: Secondary | ICD-10-CM | POA: Diagnosis not present

## 2021-12-01 DIAGNOSIS — I480 Paroxysmal atrial fibrillation: Secondary | ICD-10-CM | POA: Diagnosis not present

## 2021-12-02 ENCOUNTER — Encounter: Payer: Self-pay | Admitting: Sports Medicine

## 2021-12-02 ENCOUNTER — Ambulatory Visit (INDEPENDENT_AMBULATORY_CARE_PROVIDER_SITE_OTHER): Payer: Medicare Other | Admitting: Sports Medicine

## 2021-12-02 DIAGNOSIS — F0390 Unspecified dementia without behavioral disturbance: Secondary | ICD-10-CM

## 2021-12-02 DIAGNOSIS — M79674 Pain in right toe(s): Secondary | ICD-10-CM | POA: Diagnosis not present

## 2021-12-02 DIAGNOSIS — E1159 Type 2 diabetes mellitus with other circulatory complications: Secondary | ICD-10-CM

## 2021-12-02 DIAGNOSIS — B351 Tinea unguium: Secondary | ICD-10-CM | POA: Diagnosis not present

## 2021-12-02 DIAGNOSIS — M79675 Pain in left toe(s): Secondary | ICD-10-CM | POA: Diagnosis not present

## 2021-12-02 DIAGNOSIS — M21372 Foot drop, left foot: Secondary | ICD-10-CM

## 2021-12-02 NOTE — Progress Notes (Signed)
Subjective: Alexander Donaldson is a 88 y.o. male patient with history of diabetes who presents to office today for diabetic foot care and complaining of long,mildly painful nails  while ambulating in shoes; unable to trim. PCP, Dr. Chestine Sporelark last visit was June 2022 goes annually.   Patient is assisted by wife this visit who reports that he is a little bit more verbal today than usual.  Patient Active Problem List   Diagnosis Date Noted   Carpal tunnel syndrome, bilateral 02/25/2020   Numbness 11/28/2019   Raynaud's phenomenon 10/30/2019   Side pain 09/13/2018   CAD (coronary artery disease) 02/12/2018   Left foot drop 02/12/2018   Type 2 diabetes mellitus with vascular disease (HCC) 02/12/2018   Essential hypertension 02/12/2018   Elevated LFTs 02/12/2018   Nausea & vomiting 02/12/2018   Current Outpatient Medications on File Prior to Visit  Medication Sig Dispense Refill   acetaminophen (TYLENOL) 500 MG tablet Take 500 mg by mouth every 6 (six) hours as needed for moderate pain.     Alogliptin Benzoate 12.5 MG TABS TAKE ONE TABLET BY MOUTH EVERY DAY FOR DIABETES *REPLACES GLIPIZIDE*     amLODipine (NORVASC) 10 MG tablet Take 5 mg by mouth 2 (two) times daily.     aspirin 81 MG tablet Take 81 mg by mouth daily.     atorvastatin (LIPITOR) 80 MG tablet Take 40 mg by mouth every evening.     clopidogrel (PLAVIX) 75 MG tablet Take 75 mg by mouth daily.  3   diclofenac sodium (VOLTAREN) 1 % GEL Apply 2 g topically 4 (four) times daily as needed (pain). 100 g 0   losartan (COZAAR) 100 MG tablet Take 100 mg by mouth daily.      memantine (NAMENDA) 10 MG tablet Take 1 tablet (10 mg total) by mouth 2 (two) times daily. Start only after first finishing starter pack 60 tablet 3   metoprolol succinate (TOPROL-XL) 50 MG 24 hr tablet Take 50 mg by mouth at bedtime. Take with or immediately following a meal.     Multiple Vitamin (MULTIVITAMIN) tablet Take 1 tablet by mouth daily.     nitroGLYCERIN  (NITROSTAT) 0.4 MG SL tablet Place 1 tablet (0.4 mg total) under the tongue every 5 (five) minutes x 3 doses as needed for chest pain. 25 tablet 3   psyllium (HYDROCIL/METAMUCIL) 95 % PACK Take 1 packet by mouth 2 (two) times daily.      No current facility-administered medications on file prior to visit.   Allergies  Allergen Reactions   Lisinopril     Other reaction(s): Cough   Metformin Diarrhea    Recent Results (from the past 2160 hour(s))  Basic metabolic panel     Status: Abnormal   Collection Time: 11/30/21  9:20 AM  Result Value Ref Range   Sodium 137 135 - 145 mmol/L   Potassium 3.5 3.5 - 5.1 mmol/L   Chloride 103 98 - 111 mmol/L   CO2 23 22 - 32 mmol/L   Glucose, Bld 175 (H) 70 - 99 mg/dL    Comment: Glucose reference range applies only to samples taken after fasting for at least 8 hours.   BUN 15 8 - 23 mg/dL   Creatinine, Ser 0.451.05 0.61 - 1.24 mg/dL   Calcium 8.8 (L) 8.9 - 10.3 mg/dL   GFR, Estimated >40>60 >98>60 mL/min    Comment: (NOTE) Calculated using the CKD-EPI Creatinine Equation (2021)    Anion gap 11 5 - 15  Comment: Performed at [facility], [address] [place], [place] [address]  Troponin I (High Sensitivity)     Status: None   Collection Time: 11/30/21  9:20 AM  Result Value Ref Range   Troponin I (High Sensitivity) 11 <18 ng/L    Comment: (NOTE) Elevated high sensitivity troponin I (hsTnI) values and significant  changes across serial measurements may suggest ACS but many other  chronic and acute conditions are known to elevate hsTnI results.  Refer to the "Links" section for chest pain algorithms and additional  guidance. Performed at [facility], [address] [place], [place] [address]   CBC     Status: None   Collection Time: 11/30/21  9:20 AM  Result Value Ref Range   WBC 5.4 4.0 - 10.5 K/uL   RBC 4.61 4.22 - 5.81 MIL/uL   Hemoglobin 14.3 13.0 - 17.0 g/dL   HCT 60.7 37.1 - 06.2 %   MCV 92.4 80.0 - 100.0 fL   MCH 31.0  26.0 - 34.0 pg   MCHC 33.6 30.0 - 36.0 g/dL   RDW 69.4 85.4 - 62.7 %   Platelets 165 150 - 400 K/uL   nRBC 0.0 0.0 - 0.2 %    Comment: Performed at [facility], [address] [place], [place] [address]  Troponin I (High Sensitivity)     Status: None   Collection Time: 11/30/21 11:14 AM  Result Value Ref Range   Troponin I (High Sensitivity) 13 <18 ng/L    Comment: (NOTE) Elevated high sensitivity troponin I (hsTnI) values and significant  changes across serial measurements may suggest ACS but many other  chronic and acute conditions are known to elevate hsTnI results.  Refer to the "Links" section for chest pain algorithms and additional  guidance. Performed at [facility], [address], [place], [place] [address]     Objective: General: Patient is awake, alert, and oriented x 2 and in no acute distress.  History of dementia.  Integument: Skin is warm, dry and supple bilateral. Nails are tender,  long, thickened and dystrophic with subungual debris, consistent with onychomycosis, 1-5 bilateral. No signs of infection. No open lesions or preulcerative lesions present bilateral. Remaining integument unremarkable.  Vasculature:  Dorsalis Pedis pulse 1/4 bilateral. Posterior Tibial pulse 0/4 bilateral.  Capillary fill time <5 sec 1-5 bilateral.  Scant hair growth to the level of the digits. Temperature gradient within normal limits. No varicosities present bilateral. No edema present bilateral.   Neurology: Sensation diminished bilateral however due to history of his dementia difficult to accurately access.  Musculoskeletal:  Asymptomatic hammertoe and left foot drop, functional not braced, no other pedal deformities noted bilateral.   Assessment and Plan: Problem List Items Addressed This Visit       Cardiovascular and Mediastinum   Type 2 diabetes mellitus with vascular disease (HCC)     Other   Left foot drop   Other Visit Diagnoses     Pain due to  onychomycosis of toenails of both feet    -  Primary   Dementia, unspecified dementia severity, unspecified dementia type, unspecified whether behavioral, psychotic, or mood disturbance or anxiety (HCC)           -Examined patient. -Re-Discussed and educated patient and wife on diabetic foot care, especially with  regards to the vascular, neurological and musculoskeletal systems.  -Mechanically debrided all painful nails 1-5 bilateral using sterile nail nipper and filed with dremel  without incident  -Dispense toe separators for patient to use as directed with help from wife in between the first and second toes bilateral -Answered all patient questions -Patient to return  in 3 months for at risk foot care -Patient advised to call the office if any problems or questions arise in the meantime.  Asencion Islam, DPM

## 2021-12-02 NOTE — Patient Instructions (Signed)
Use toe spacers when in shoes to prevent toes from rubbing

## 2021-12-20 ENCOUNTER — Telehealth: Payer: Self-pay

## 2021-12-20 NOTE — Telephone Encounter (Signed)
Pts wife (on [place]) called and requested notes be faxed to [place].   Notes faxed as requested.

## 2021-12-30 ENCOUNTER — Ambulatory Visit (INDEPENDENT_AMBULATORY_CARE_PROVIDER_SITE_OTHER): Payer: Medicare Other

## 2021-12-30 VITALS — Ht 64.0 in | Wt 164.0 lb

## 2021-12-30 DIAGNOSIS — Z0000 Encounter for general adult medical examination without abnormal findings: Secondary | ICD-10-CM

## 2021-12-30 NOTE — Patient Instructions (Signed)
Alexander Donaldson , Thank you for taking time to come for your Medicare Wellness Visit. I appreciate your ongoing commitment to your health goals. Please review the following plan we discussed and let me know if I can assist you in the future.   Screening recommendations/referrals: Colonoscopy: not required Recommended yearly ophthalmology/optometry visit for glaucoma screening and checkup Recommended yearly dental visit for hygiene and checkup  Vaccinations: Influenza vaccine: due 02/08/2022 Pneumococcal vaccine: completed 02/20/2014 Tdap vaccine: due Shingles vaccine: completed   Covid-19:  10/20/2021, 01/12/2021, 04/25/2020, 09/11/2019, 08/17/2019  Advanced directives: Advance directive discussed with you today. .  Conditions/risks identified: none  Next appointment: Follow up in one year for your annual wellness visit.   Preventive Care 86 Years and Older, Male Preventive care refers to lifestyle choices and visits with your health care provider that can promote health and wellness. What does preventive care include? A yearly physical exam. This is also called an annual well check. Dental exams once or twice a year. Routine eye exams. Ask your health care provider how often you should have your eyes checked. Personal lifestyle choices, including: Daily care of your teeth and gums. Regular physical activity. Eating a healthy diet. Avoiding tobacco and drug use. Limiting alcohol use. Practicing safe sex. Taking low doses of aspirin every day. Taking vitamin and mineral supplements as recommended by your health care provider. What happens during an annual well check? The services and screenings done by your health care provider during your annual well check will depend on your age, overall health, lifestyle risk factors, and family history of disease. Counseling  Your health care provider may ask you questions about your: Alcohol use. Tobacco use. Drug use. Emotional well-being. Home and  relationship well-being. Sexual activity. Eating habits. History of falls. Memory and ability to understand (cognition). Work and work Astronomer. Screening  You may have the following tests or measurements: Height, weight, and BMI. Blood pressure. Lipid and cholesterol levels. These may be checked every 5 years, or more frequently if you are over 58 years old. Skin check. Lung cancer screening. You may have this screening every year starting at age 52 if you have a 30-pack-year history of smoking and currently smoke or have quit within the past 15 years. Fecal occult blood test (FOBT) of the stool. You may have this test every year starting at age 58. Flexible sigmoidoscopy or colonoscopy. You may have a sigmoidoscopy every 5 years or a colonoscopy every 10 years starting at age 3. Prostate cancer screening. Recommendations will vary depending on your family history and other risks. Hepatitis C blood test. Hepatitis B blood test. Sexually transmitted disease (STD) testing. Diabetes screening. This is done by checking your blood sugar (glucose) after you have not eaten for a while (fasting). You may have this done every 1-3 years. Abdominal aortic aneurysm (AAA) screening. You may need this if you are a current or former smoker. Osteoporosis. You may be screened starting at age 51 if you are at high risk. Talk with your health care provider about your test results, treatment options, and if necessary, the need for more tests. Vaccines  Your health care provider may recommend certain vaccines, such as: Influenza vaccine. This is recommended every year. Tetanus, diphtheria, and acellular pertussis (Tdap, Td) vaccine. You may need a Td booster every 10 years. Zoster vaccine. You may need this after age 20. Pneumococcal 13-valent conjugate (PCV13) vaccine. One dose is recommended after age 11. Pneumococcal polysaccharide (PPSV23) vaccine. One dose is recommended after  age 2. Talk to your  health care provider about which screenings and vaccines you need and how often you need them. This information is not intended to replace advice given to you by your health care provider. Make sure you discuss any questions you have with your health care provider. Document Released: 07/24/2015 Document Revised: 03/16/2016 Document Reviewed: 04/28/2015 Elsevier Interactive Patient Education  2017 [place] Prevention in the Home Falls can cause injuries. They can happen to people of all ages. There are many things you can do to make your home safe and to help prevent falls. What can I do on the outside of my home? Regularly fix the edges of walkways and driveways and fix any cracks. Remove anything that might make you trip as you walk through a door, such as a raised step or threshold. Trim any bushes or trees on the path to your home. Use bright outdoor lighting. Clear any walking paths of anything that might make someone trip, such as rocks or tools. Regularly check to see if handrails are loose or broken. Make sure that both sides of any steps have handrails. Any raised decks and porches should have guardrails on the edges. Have any leaves, snow, or ice cleared regularly. Use sand or salt on walking paths during winter. Clean up any spills in your garage right away. This includes oil or grease spills. What can I do in the bathroom? Use night lights. Install grab bars by the toilet and in the tub and shower. Do not use towel bars as grab bars. Use non-skid mats or decals in the tub or shower. If you need to sit down in the shower, use a plastic, non-slip stool. Keep the floor dry. Clean up any water that spills on the floor as soon as it happens. Remove soap buildup in the tub or shower regularly. Attach bath mats securely with double-sided non-slip rug tape. Do not have throw rugs and other things on the floor that can make you trip. What can I do in the bedroom? Use night  lights. Make sure that you have a light by your bed that is easy to reach. Do not use any sheets or blankets that are too big for your bed. They should not hang down onto the floor. Have a firm chair that has side arms. You can use this for support while you get dressed. Do not have throw rugs and other things on the floor that can make you trip. What can I do in the kitchen? Clean up any spills right away. Avoid walking on wet floors. Keep items that you use a lot in easy-to-reach places. If you need to reach something above you, use a strong step stool that has a grab bar. Keep electrical cords out of the way. Do not use floor polish or wax that makes floors slippery. If you must use wax, use non-skid floor wax. Do not have throw rugs and other things on the floor that can make you trip. What can I do with my stairs? Do not leave any items on the stairs. Make sure that there are handrails on both sides of the stairs and use them. Fix handrails that are broken or loose. Make sure that handrails are as long as the stairways. Check any carpeting to make sure that it is firmly attached to the stairs. Fix any carpet that is loose or worn. Avoid having throw rugs at the top or bottom of the stairs. If you do  have throw rugs, attach them to the floor with carpet tape. Make sure that you have a light switch at the top of the stairs and the bottom of the stairs. If you do not have them, ask someone to add them for you. What else can I do to help prevent falls? Wear shoes that: Do not have high heels. Have rubber bottoms. Are comfortable and fit you well. Are closed at the toe. Do not wear sandals. If you use a stepladder: Make sure that it is fully opened. Do not climb a closed stepladder. Make sure that both sides of the stepladder are locked into place. Ask someone to hold it for you, if possible. Clearly mark and make sure that you can see: Any grab bars or handrails. First and last  steps. Where the edge of each step is. Use tools that help you move around (mobility aids) if they are needed. These include: Canes. Walkers. Scooters. Crutches. Turn on the lights when you go into a dark area. Replace any light bulbs as soon as they burn out. Set up your furniture so you have a clear path. Avoid moving your furniture around. If any of your floors are uneven, fix them. If there are any pets around you, be aware of where they are. Review your medicines with your doctor. Some medicines can make you feel dizzy. This can increase your chance of falling. Ask your doctor what other things that you can do to help prevent falls. This information is not intended to replace advice given to you by your health care provider. Make sure you discuss any questions you have with your health care provider. Document Released: 04/23/2009 Document Revised: 12/03/2015 Document Reviewed: 08/01/2014 Elsevier Interactive Patient Education  2017 [employer].

## 2021-12-30 NOTE — Progress Notes (Cosign Needed)
I connected with Alexander Donaldson today by telephone and verified that I am speaking with the correct person using two identifiers. Location patient: home Location provider: work Persons participating in the virtual visit: Addison Whidbee, Rogerick Baldwin (wife), Elisha Ponder LPN.   I discussed the limitations, risks, security and privacy concerns of performing an evaluation and management service by telephone and the availability of in person appointments. I also discussed with the patient that there may be a patient responsible charge related to this service. The patient expressed understanding and verbally consented to this telephonic visit.    Interactive audio and video telecommunications were attempted between this provider and patient, however failed, due to patient having technical difficulties OR patient did not have access to video capability.  We continued and completed visit with audio only.     Vital signs may be patient reported or missing.  Subjective:   Alexander Donaldson is a 88 y.o. male who presents for an Initial Medicare Annual Wellness Visit.  Review of Systems     Cardiac Risk Factors include: advanced age (>33men, >68 women);diabetes mellitus;hypertension;male gender     Objective:    Today's Vitals   12/30/21 1514  Weight: 164 lb (74.4 kg)  Height: 5\' 4"  (1.626 m)   Body mass index is 28.15 kg/m.     12/30/2021    3:22 PM 11/30/2021    9:31 AM 02/12/2018   12:25 AM 02/01/2017    1:15 PM 02/01/2017    6:08 AM 03/16/2012   11:30 AM  Advanced Directives  Does Patient Have a Medical Advance Directive? No Unable to assess, patient is non-responsive or altered mental status No No No Patient does not have advance directive;Patient would not like information  Would patient like information on creating a medical advance directive?   Yes (Inpatient - patient requests chaplain consult to create a medical advance directive) No - Patient declined    Pre-existing out of facility  DNR order (yellow form or pink MOST form)      No    Current Medications (verified) Outpatient Encounter Medications as of 12/30/2021  Medication Sig   acetaminophen (TYLENOL) 500 MG tablet Take 500 mg by mouth every 6 (six) hours as needed for moderate pain.   Alogliptin Benzoate 12.5 MG TABS TAKE ONE TABLET BY MOUTH EVERY DAY FOR DIABETES *REPLACES GLIPIZIDE*   amLODipine (NORVASC) 10 MG tablet Take 5 mg by mouth 2 (two) times daily.   aspirin 81 MG tablet Take 81 mg by mouth daily.   atorvastatin (LIPITOR) 80 MG tablet Take 40 mg by mouth every evening.   clopidogrel (PLAVIX) 75 MG tablet Take 75 mg by mouth daily.   diclofenac sodium (VOLTAREN) 1 % GEL Apply 2 g topically 4 (four) times daily as needed (pain).   losartan (COZAAR) 100 MG tablet Take 100 mg by mouth daily.    memantine (NAMENDA) 10 MG tablet Take 1 tablet (10 mg total) by mouth 2 (two) times daily. Start only after first finishing starter pack   metoprolol succinate (TOPROL-XL) 50 MG 24 hr tablet Take 50 mg by mouth at bedtime. Take with or immediately following a meal.   Multiple Vitamin (MULTIVITAMIN) tablet Take 1 tablet by mouth daily.   psyllium (HYDROCIL/METAMUCIL) 95 % PACK Take 1 packet by mouth 2 (two) times daily.    nitroGLYCERIN (NITROSTAT) 0.4 MG SL tablet Place 1 tablet (0.4 mg total) under the tongue every 5 (five) minutes x 3 doses as needed for chest pain.  No facility-administered encounter medications on file as of 12/30/2021.    Allergies (verified) Lisinopril and Metformin   History: Past Medical History:  Diagnosis Date   Acute encephalopathy 02/12/2018   CAP (community acquired pneumonia) 02/11/2018   Carpal tunnel syndrome, bilateral 02/25/2020   Coronary artery disease    Diabetes mellitus    Diverticulitis    Hypertension    Hypokalemia 02/12/2018   Past Surgical History:  Procedure Laterality Date   ANGIOPLASTY     CORONARY STENT INTERVENTION N/A 02/02/2017   Procedure: Coronary Stent  Intervention;  Surgeon: Rinaldo Cloud, MD;  Location: [facility] INVASIVE CV LAB;  Service: Cardiovascular;  Laterality: N/A;   CORONARY STENTS     LEFT HEART CATH AND CORONARY ANGIOGRAPHY N/A 02/02/2017   Procedure: Left Heart Cath and Coronary Angiography;  Surgeon: Rinaldo Cloud, MD;  Location[facility] INVASIVE CV LAB;  Service: Cardiovascular;  Laterality: N/A;   LEFT HEART CATHETERIZATION WITH CORONARY ANGIOGRAM N/A 03/16/2012   Procedure: LEFT HEART CATHETERIZATION WITH CORONARY ANGIOGRAM;  Surgeon: Robynn Pane, MD;  Location: [facility] CATH LAB;  Service: Cardiovascular;  Laterality: N/A;   PERCUTANEOUS CORONARY STENT INTERVENTION (PCI-S)  03/16/2012   Procedure: PERCUTANEOUS CORONARY STENT INTERVENTION (PCI-S);  Surgeon: Robynn Pane, MD;  Location: [facility] CATH LAB;  Service: Cardiovascular;;   TONSILLECTOMY     History reviewed. No pertinent family history. Social History   Socioeconomic History   Marital status: Married    Spouse name: Alexander Donaldson   Number of children: Not on file   Years of education: Not on file   Highest education level: Not on file  Occupational History   Not on file  Tobacco Use   Smoking status: Never   Smokeless tobacco: Never  Vaping Use   Vaping Use: Never used  Substance and Sexual Activity   Alcohol use: No    Comment: social   Drug use: No   Sexual activity: Yes    Birth control/protection: None  Other Topics Concern   Not on file  Social History Narrative   Lives with wife   Right Handed   Drinks 1-2 cups caffeine daily   Social Determinants of Health   Financial Resource Strain: Low Risk  (12/30/2021)   Overall Financial Resource Strain (CARDIA)    Difficulty of Paying Living Expenses: Not hard at all  Food Insecurity: No Food Insecurity (12/30/2021)   Hunger Vital Sign    Worried About Running Out of Food in the Last Year: Never true    Ran Out of Food in the Last Year: Never true  Transportation Needs: No Transportation Needs (12/30/2021)   PRAPARE -  Transportation    Lack of Transportation (Medical): No    Lack of Transportation (Non-Medical): No  Physical Activity: Inactive (12/30/2021)   Exercise Vital Sign    Days of Exercise per Week: 0 days    Minutes of Exercise per Session: 0 min  Stress: No Stress Concern Present (12/30/2021)   [employer] of Occupational Health - Occupational Stress Questionnaire    Feeling of Stress : Only a little  Social Connections: Not on file    Tobacco Counseling Counseling given: Not Answered   Clinical Intake:  Pre-visit preparation completed: Yes  Pain : No/denies pain     Nutritional Status: BMI 25 -29 Overweight Nutritional Risks: None Diabetes: Yes  How often do you need to have someone help you when you read instructions, pamphlets, or other written materials from your doctor or pharmacy?: 1 - Never  Diabetic? Yes Nutrition Risk Assessment:  Has the patient had any N/V/D within the last 2 months?  No  Does the patient have any non-healing wounds?  No  Has the patient had any unintentional weight loss or weight gain?  No   Diabetes:  Is the patient diabetic?  Yes  If diabetic, was a CBG obtained today?  No  Did the patient bring in their glucometer from home?  No  How often do you monitor your CBG's? Once weekly.   Financial Strains and Diabetes Management:  Are you having any financial strains with the device, your supplies or your medication? No .  Does the patient want to be seen by Chronic Care Management for management of their diabetes?  No  Would the patient like to be referred to a Nutritionist or for Diabetic Management?  No   Diabetic Exams:  Diabetic Eye Exam: Overdue for diabetic eye exam. Pt has been advised about the importance in completing this exam. Patient advised to call and schedule an eye exam. Diabetic Foot Exam: Completed 09/02/2021   Interpreter Needed?: No  Information entered by :: NAllen LPN   Activities of Daily Living     12/30/2021    3:25 PM  In your present state of health, do you have any difficulty performing the following activities:  Hearing? 1  Comment has a hearing aid  Vision? 0  Difficulty concentrating or making decisions? 1  Walking or climbing stairs? 1  Dressing or bathing? 0  Doing errands, shopping? 1  Preparing Food and eating ? Y  Using the Toilet? N  In the past six months, have you accidently leaked urine? N  Do you have problems with loss of bowel control? N  Managing your Medications? Y  Managing your Finances? Y  Housekeeping or managing your Housekeeping? Y    Patient Care Team: Doreene Nest, NP as PCP - General (Internal Medicine)  Indicate any recent Medical Services you may have received from other than [facility] providers in the past year (date may be approximate).     Assessment:   This is a routine wellness examination for [facility].  Hearing/Vision screen Vision Screening - Comments:: Regular eye exams, [facility]  Dietary issues and exercise activities discussed: Current Exercise Habits: The patient does not participate in regular exercise at present   Goals Addressed             This Visit's Progress    Patient Stated       12/30/2021, keep sugar, salt down       Depression Screen    12/30/2021    3:24 PM 03/15/2017    1:03 PM 04/23/2012    9:12 AM  PHQ 2/9 Scores  PHQ - 2 Score 0 0 0    Fall Risk    12/30/2021    3:23 PM 03/07/2017    8:50 AM  Fall Risk   Falls in the past year? 1 No  Comment tripped over door jam   Number falls in past yr: 1   Injury with Fall? 0   Risk for fall due to : Impaired balance/gait;Impaired mobility;Medication side effect Impaired mobility;Mental status change;History of fall(s)  Follow up Falls evaluation completed;Education provided;Falls prevention discussed     FALL RISK PREVENTION PERTAINING TO THE HOME:  Any stairs in or around the home? Yes  If so, are there any without handrails? Yes  Home  free of loose throw rugs in walkways, pet beds, electrical cords,  etc? Yes  Adequate lighting in your home to reduce risk of falls? Yes   ASSISTIVE DEVICES UTILIZED TO PREVENT FALLS:  Life alert? No  Use of a cane, walker or w/c? Yes  Grab bars in the bathroom? Yes  Shower chair or bench in shower? Yes  Elevated toilet seat or a handicapped toilet? No   TIMED UP AND GO:  Was the test performed? No .       Cognitive Function:    03/31/2020    1:27 PM  MMSE - Mini Mental State Exam  Orientation to time 2  Orientation to Place 1  Registration 3  Attention/ Calculation 0  Recall 0  Language- name 2 objects 2  Language- repeat 1  Language- follow 3 step command 2  Language- read & follow direction 0  Write a sentence 1  Copy design 1  Copy design-comments 3 animals  Total score 13        Immunizations Immunization History  Administered Date(s) Administered   Influenza, Seasonal, Injecte, Preservative Fre 05/11/2011, 04/27/2012, 05/20/2013, 07/22/2014   Influenza,inj,Quad PF,6+ Mos 04/18/2019, 06/17/2020   Influenza,trivalent, recombinat, inj, PF 05/08/2017, 05/11/2018   Influenza-Unspecified 06/03/2015, 05/11/2016, 04/11/2019   PFIZER Comirnaty(Gray Top)Covid-19 Tri-Sucrose Vaccine 01/12/2021   PFIZER(Purple Top)SARS-COV-2 Vaccination 08/17/2019, 09/11/2019, 04/25/2020   Pneumococcal Conjugate-13 02/20/2014   Pneumococcal Polysaccharide-23 04/27/2012   Tdap 11/23/2011   Zoster Recombinat (Shingrix) 12/23/2019, 05/25/2020    TDAP status: Due, Education has been provided regarding the importance of this vaccine. Advised may receive this vaccine at local pharmacy or Health Dept. Aware to provide a copy of the vaccination record if obtained from local pharmacy or Health Dept. Verbalized acceptance and understanding.  Flu Vaccine status: Up to date  Pneumococcal vaccine status: Up to date  Covid-19 vaccine status: Completed vaccines  Qualifies for Shingles Vaccine?  Yes   Zostavax completed Yes   Shingrix Completed?: Yes  Screening Tests Health Maintenance  Topic Date Due   OPHTHALMOLOGY EXAM  11/18/2020   COVID-19 Vaccine (5 - Pfizer series) 03/09/2021   HEMOGLOBIN A1C  07/01/2021   TETANUS/TDAP  11/22/2021   INFLUENZA VACCINE  02/08/2022   FOOT EXAM  09/02/2022   Pneumonia Vaccine 1465+ Years old  Completed   Zoster Vaccines- Shingrix  Completed   HPV VACCINES  Aged Out    Health Maintenance  Health Maintenance Due  Topic Date Due   OPHTHALMOLOGY EXAM  11/18/2020   COVID-19 Vaccine (5 - Pfizer series) 03/09/2021   HEMOGLOBIN A1C  07/01/2021   TETANUS/TDAP  11/22/2021    Colorectal cancer screening: No longer required.   Lung Cancer Screening: (Low Dose CT Chest recommended if Age 97-80 years, 30 pack-year currently smoking OR have quit w/in 15years.) does not qualify.   Lung Cancer Screening Referral: no  Additional Screening:  Hepatitis C Screening: does not qualify;   Vision Screening: Recommended annual ophthalmology exams for early detection of glaucoma and other disorders of the eye. Is the patient up to date with their annual eye exam?  No  Who is the provider or what is the name of the office in which the patient attends annual [facility] exams[facility] If pt is not established with a provider, would they like to be referred to a provider to establish care? No .   Dental Screening: Recommended annual dental exams for proper oral hygiene  Community Resource Referral / Chronic Care Management: CRR required this visit?  No   CCM required this visit?  No  Plan:     I have personally reviewed and noted the following in the patient's chart:   Medical and social history Use of alcohol, tobacco or illicit drugs  Current medications and supplements including opioid prescriptions. Patient is not currently taking opioid prescriptions. Functional ability and status Nutritional status Physical activity Advanced  directives List of other physicians Hospitalizations, surgeries, and ER visits in previous 12 months Vitals Screenings to include cognitive, depression, and falls Referrals and appointments  In addition, I have reviewed and discussed with patient certain preventive protocols, quality metrics, and best practice recommendations. A written personalized care plan for preventive services as well as general preventive health recommendations were provided to patient.     Barb Merino, LPN   01/08/1600   Nurse Notes: 6 CIT not administered. Patient has dementia. Currently on memantine. Followed by neurology. Wife answered questions.  Due to this being a virtual visit, the after visit summary with patients personalized plan was offered to patient via mail or my-chart.  Patient would like to access on my-chart

## 2022-01-29 ENCOUNTER — Encounter (HOSPITAL_COMMUNITY): Payer: Self-pay

## 2022-01-29 ENCOUNTER — Observation Stay (HOSPITAL_COMMUNITY)
Admission: EM | Admit: 2022-01-29 | Discharge: 2022-01-31 | Disposition: A | Discharge status: Home / Self Care | Payer: Medicare Other | Attending: Internal Medicine | Admitting: Internal Medicine

## 2022-01-29 ENCOUNTER — Emergency Department (HOSPITAL_COMMUNITY): Payer: Medicare Other

## 2022-01-29 ENCOUNTER — Other Ambulatory Visit: Payer: Self-pay

## 2022-01-29 DIAGNOSIS — E119 Type 2 diabetes mellitus without complications: Secondary | ICD-10-CM | POA: Diagnosis present

## 2022-01-29 DIAGNOSIS — R000 Tachycardia, unspecified: Principal | ICD-10-CM | POA: Insufficient documentation

## 2022-01-29 DIAGNOSIS — Z7902 Long term (current) use of antithrombotics/antiplatelets: Secondary | ICD-10-CM | POA: Diagnosis not present

## 2022-01-29 DIAGNOSIS — I10 Essential (primary) hypertension: Secondary | ICD-10-CM | POA: Diagnosis not present

## 2022-01-29 DIAGNOSIS — R531 Weakness: Secondary | ICD-10-CM | POA: Diagnosis not present

## 2022-01-29 DIAGNOSIS — I2510 Atherosclerotic heart disease of native coronary artery without angina pectoris: Secondary | ICD-10-CM | POA: Diagnosis present

## 2022-01-29 DIAGNOSIS — I959 Hypotension, unspecified: Secondary | ICD-10-CM | POA: Diagnosis not present

## 2022-01-29 DIAGNOSIS — R0902 Hypoxemia: Secondary | ICD-10-CM | POA: Diagnosis not present

## 2022-01-29 DIAGNOSIS — M6281 Muscle weakness (generalized): Secondary | ICD-10-CM | POA: Insufficient documentation

## 2022-01-29 DIAGNOSIS — R2689 Other abnormalities of gait and mobility: Secondary | ICD-10-CM | POA: Insufficient documentation

## 2022-01-29 DIAGNOSIS — R778 Other specified abnormalities of plasma proteins: Secondary | ICD-10-CM | POA: Insufficient documentation

## 2022-01-29 DIAGNOSIS — Z79899 Other long term (current) drug therapy: Secondary | ICD-10-CM | POA: Diagnosis not present

## 2022-01-29 DIAGNOSIS — I498 Other specified cardiac arrhythmias: Secondary | ICD-10-CM | POA: Diagnosis present

## 2022-01-29 DIAGNOSIS — Z7982 Long term (current) use of aspirin: Secondary | ICD-10-CM | POA: Diagnosis not present

## 2022-01-29 DIAGNOSIS — E876 Hypokalemia: Secondary | ICD-10-CM | POA: Diagnosis not present

## 2022-01-29 DIAGNOSIS — F0390 Unspecified dementia without behavioral disturbance: Secondary | ICD-10-CM | POA: Diagnosis present

## 2022-01-29 DIAGNOSIS — E1159 Type 2 diabetes mellitus with other circulatory complications: Secondary | ICD-10-CM | POA: Diagnosis present

## 2022-01-29 DIAGNOSIS — Z955 Presence of coronary angioplasty implant and graft: Secondary | ICD-10-CM | POA: Diagnosis not present

## 2022-01-29 DIAGNOSIS — I214 Non-ST elevation (NSTEMI) myocardial infarction: Secondary | ICD-10-CM

## 2022-01-29 LAB — CBG MONITORING, ED: Glucose-Capillary: 169 mg/dL — ABNORMAL HIGH (ref 70–99)

## 2022-01-29 NOTE — ED Provider Notes (Signed)
[facility] Emergency Department Provider Note MRN:  [id number]  Arrival date & time: 01/30/22     Chief Complaint   Weakness   History of Present Illness   Alexander Donaldson is a 88 y.o. year-old male with a history of CAD, diabetes presenting to the ED with chief complaint of weakness.  Episode of total body weakness, diaphoresis, elevated heart rate.  Per EMS heart rate was 140, blood pressure was low.  Patient is frustrated at this time because he feels fine and due to dementia does not really remember what happened.  History obtained largely by patient's wife at bedside.  Review of Systems  I was unable to obtain a full/accurate HPI, PMH, or ROS due to the patient's dementia.  Patient's Health History    Past Medical History:  Diagnosis Date   Acute encephalopathy 02/12/2018   CAP (community acquired pneumonia) 02/11/2018   Carpal tunnel syndrome, bilateral 02/25/2020   Coronary artery disease    Diabetes mellitus    Diverticulitis    Hypertension    Hypokalemia 02/12/2018    Past Surgical History:  Procedure Laterality Date   ANGIOPLASTY     CORONARY STENT INTERVENTION N/A 02/02/2017   Procedure: Coronary Stent Intervention;  Surgeon: Rinaldo Cloud, MD;  [facility] IN[facility];  Service: Cardiovascular;  Laterality: N/A;   CORONARY STENTS     LEFT HEART CATH AND CORONARY ANGIOGRAPHY N/A 02/02/2017   Procedure: Left Heart Cath and Coronary Angiography;  Surgeon: Rinaldo Cloud, MD;  Location: [facility] INVASIVE CV LAB;  Service: Cardiovascular;  Laterality: N/A;   LEFT HEART CATHETERIZATION WITH CORONARY ANGIOGRAM N/A 03/16/2012   Procedure: LEFT HEART CATHETERIZATION WITH CORONARY ANGIOGRAM;  Surgeon: Robynn Pane, MD;  [facility] CATH [facility];  Service: Cardiovascular;  Laterality: N/A;   PERCUTANEOUS CORONARY STENT INTERVENTION (PCI-S)  03/16/2012   Procedure: PERCUTANEOUS CORONARY STENT INTERVENTION (PCI-S);  Surgeon: Robynn Pane, MD;  Location: [facility]  CATH LAB;  Service: Cardiovascular;;   TONSILLECTOMY      No family history on file.  Social History   Socioeconomic History   Marital status: Married    Spouse name: Clydie Braun   Number of children: Not on file   Years of education: Not on file   Highest education level: Not on file  Occupational History   Not on file  Tobacco Use   Smoking status: Never   Smokeless tobacco: Never  Vaping Use   Vaping Use: Never used  Substance and Sexual Activity   Alcohol use: No    Comment: social   Drug use: No   Sexual activity: Yes    Birth control/protection: None  Other Topics Concern   Not on file  Social History Narrative   Lives with wife   Right Handed   Drinks 1-2 cups caffeine daily   Social Determinants of Health   Financial Resource Strain: Low Risk  (12/30/2021)   Overall Financial Resource Strain (CARDIA)    Difficulty of Paying Living Expenses: Not hard at all  Food Insecurity: No Food Insecurity (12/30/2021)   Hunger Vital Sign    Worried About Running Out of Food in the Last Year: Never true    Ran Out of Food in the Last Year: Never true  Transportation Needs: No Transportation Needs (12/30/2021)   PRAPARE - Transportation    Lack of Transportation (Medical): No    Lack of Transportation (Non-Medical): No  Physical Activity: Inactive (12/30/2021)   Exercise Vital Sign  Days of Exercise per Week: 0 days    Minutes of Exercise per Session: 0 min  Stress: No Stress Concern Present (12/30/2021)   [employer] of Occupational Health - Occupational Stress Questionnaire    Feeling of Stress : Only a little  Social Connections: Not on file  Intimate Partner Violence: Not on file     Physical Exam   Vitals:   01/30/22 0045 01/30/22 0100  BP: 106/63 120/84  Pulse: 84 84  Resp: 17 17  Temp:    SpO2: 98% 98%    CONSTITUTIONAL: Well-appearing, NAD NEURO/PSYCH:  Alert, oriented to name, moves all extremities equally EYES:  eyes equal and reactive ENT/NECK:   no LAD, no JVD CARDIO: Regular rate, well-perfused, normal S1 and S2 PULM:  CTAB no wheezing or rhonchi GI/GU:  non-distended, non-tender MSK/SPINE:  No gross deformities, no edema SKIN:  no rash, atraumatic   *Additional and/or pertinent findings included in MDM below  Diagnostic and Interventional Summary    EKG Interpretation  Date/Time:  Saturday January 29 2022 23:23:39 EDT Ventricular Rate:  98 PR Interval:  145 QRS Duration: 110 QT Interval:  353 QTC Calculation: 451 R Axis:   2 Text Interpretation: Sinus rhythm Probable left ventricular hypertrophy Confirmed by Kennis Carina [phone]) on 01/29/2022 11:38:25 PM       Labs Reviewed  BASIC METABOLIC PANEL - Abnormal; Notable for the following components:      Result Value   Potassium 3.2 (*)    Glucose, Bld 180 (*)    BUN 24 (*)    All other components within normal limits  CBG MONITORING, ED - Abnormal; Notable for the following components:   Glucose-Capillary 169 (*)    All other components within normal limits  TROPONIN I (HIGH SENSITIVITY) - Abnormal; Notable for the following components:   Troponin I (High Sensitivity) 30 (*)    All other components within normal limits  CBC  MAGNESIUM  TROPONIN I (HIGH SENSITIVITY)    DG Chest Port 1 View  Final Result      Medications - No data to display   Procedures  /  Critical Care .1-3 Lead EKG Interpretation  Performed by: Sabas Sous, MD Authorized by: Sabas Sous, MD     Interpretation: abnormal     ECG rate:  140   ECG rate assessment: tachycardic     Rhythm comment:  Junctional rhythm Comments:     Cardiac monitoring was ordered to monitor the patient for dysrhythmia.  I personally interpreted the patient's rhythm strips obtained by EMS while at the bedside.     ED Course and Medical Decision Making  Initial Impression and Ddx Concern for tachyarrhythmia based on history and the rhythm strips obtained by EMS.  Best interpretation is a  junctional rhythm with accelerated rate.  I do not appreciate any P waves but the rhythm is regular.  Currently patient is in sinus rhythm with rates in the 90s.  No complaints.  Placing on cardiac monitoring.  Patient has a history of CAD and so tachyarrhythmia triggered by ACS is considered.  Awaiting troponin, labs.  Past medical/surgical history that increases complexity of ED encounter: CAD  Interpretation of Diagnostics I personally reviewed the EKG and my interpretation is as follows: Sinus rhythm  Labs reveal no significant blood count or electrolyte disturbance.  First troponin is minimally elevated at 30.  Patient Reassessment and Ultimate Disposition/Management     Given the elevated troponin and concern for tachyarrhythmia we  will request hospitalist admission.  Patient management required discussion with the following services or consulting groups:  Hospitalist Service  Complexity of Problems Addressed Acute illness or injury that poses threat of life of bodily function  Additional Data Reviewed and Analyzed Further history obtained from: Further history from spouse/family member  Additional Factors Impacting ED Encounter Risk Consideration of hospitalization  Elmer Sow. Pilar Plate, MD [facility] Emergency Medicine [facility] [email or web address]  Final Clinical Impressions(s) / ED Diagnoses     ICD-10-CM   1. Tachyarrhythmia  R00.0     2. Elevated troponin  R77.8       ED Discharge Orders     None        Discharge Instructions Discussed with and Provided to Patient:   Discharge Instructions   None      Sabas Sous, MD 01/30/22 [id number]

## 2022-01-29 NOTE — ED Triage Notes (Signed)
Arrives EMS from home with c/o generalized weakness for ~3 hours. Upon EMS arrival pt noted to be tachycardic ~140bpm and mildly hypotensive at 98/60. 500cc NS administered enroute. Hx dementia.

## 2022-01-29 NOTE — ED Notes (Signed)
Portable X-ray present.

## 2022-01-30 ENCOUNTER — Observation Stay (HOSPITAL_BASED_OUTPATIENT_CLINIC_OR_DEPARTMENT_OTHER): Payer: Medicare Other

## 2022-01-30 DIAGNOSIS — F0280 Dementia in other diseases classified elsewhere without behavioral disturbance: Secondary | ICD-10-CM

## 2022-01-30 DIAGNOSIS — I498 Other specified cardiac arrhythmias: Secondary | ICD-10-CM

## 2022-01-30 DIAGNOSIS — R778 Other specified abnormalities of plasma proteins: Secondary | ICD-10-CM

## 2022-01-30 DIAGNOSIS — I10 Essential (primary) hypertension: Secondary | ICD-10-CM

## 2022-01-30 DIAGNOSIS — G309 Alzheimer's disease, unspecified: Secondary | ICD-10-CM

## 2022-01-30 DIAGNOSIS — R0609 Other forms of dyspnea: Secondary | ICD-10-CM

## 2022-01-30 DIAGNOSIS — I2510 Atherosclerotic heart disease of native coronary artery without angina pectoris: Secondary | ICD-10-CM | POA: Diagnosis not present

## 2022-01-30 DIAGNOSIS — E1159 Type 2 diabetes mellitus with other circulatory complications: Secondary | ICD-10-CM

## 2022-01-30 DIAGNOSIS — E876 Hypokalemia: Secondary | ICD-10-CM

## 2022-01-30 DIAGNOSIS — F0390 Unspecified dementia without behavioral disturbance: Secondary | ICD-10-CM | POA: Diagnosis present

## 2022-01-30 LAB — BASIC METABOLIC PANEL
Anion gap: 10 (ref 5–15)
BUN: 24 mg/dL — ABNORMAL HIGH (ref 8–23)
CO2: 24 mmol/L (ref 22–32)
Calcium: 8.9 mg/dL (ref 8.9–10.3)
Chloride: 103 mmol/L (ref 98–111)
Creatinine, Ser: 1.15 mg/dL (ref 0.61–1.24)
GFR, Estimated: >60 mL/min (ref >60)
Glucose, Bld: 180 mg/dL — ABNORMAL HIGH (ref 70–99)
Potassium: 3.2 mmol/L — ABNORMAL LOW (ref 3.5–5.1)
Sodium: 137 mmol/L (ref 135–145)

## 2022-01-30 LAB — CBC
HCT: 42.1 % (ref 39.0–52.0)
Hemoglobin: 14.8 g/dL (ref 13.0–17.0)
MCH: 31.7 pg (ref 26.0–34.0)
MCHC: 35.2 g/dL (ref 30.0–36.0)
MCV: 90.1 fL (ref 80.0–100.0)
Platelets: 170 10*3/uL (ref 150–400)
RBC: 4.67 MIL/uL (ref 4.22–5.81)
RDW: 12.5 % (ref 11.5–15.5)
WBC: 7 10*3/uL (ref 4.0–10.5)
nRBC: 0 % (ref 0.0–0.2)

## 2022-01-30 LAB — COMPREHENSIVE METABOLIC PANEL
ALT: 14 U/L (ref 0–44)
AST: 23 U/L (ref 15–41)
Albumin: 3.8 g/dL (ref 3.5–5.0)
Alkaline Phosphatase: 60 U/L (ref 38–126)
Anion gap: 8 (ref 5–15)
BUN: 18 mg/dL (ref 8–23)
CO2: 27 mmol/L (ref 22–32)
Calcium: 8.8 mg/dL — ABNORMAL LOW (ref 8.9–10.3)
Chloride: 102 mmol/L (ref 98–111)
Creatinine, Ser: 0.98 mg/dL (ref 0.61–1.24)
GFR, Estimated: >60 mL/min (ref >60)
Glucose, Bld: 203 mg/dL — ABNORMAL HIGH (ref 70–99)
Potassium: 3.9 mmol/L (ref 3.5–5.1)
Sodium: 137 mmol/L (ref 135–145)
Total Bilirubin: 1.2 mg/dL (ref 0.3–1.2)
Total Protein: 6.8 g/dL (ref 6.5–8.1)

## 2022-01-30 LAB — HEMOGLOBIN A1C
Hgb A1c MFr Bld: 7.2 % — ABNORMAL HIGH (ref 4.8–5.6)
Mean Plasma Glucose: 159.94 mg/dL

## 2022-01-30 LAB — ECHOCARDIOGRAM COMPLETE
AR max vel: 1.73 cm2
AV Peak grad: 8.5 mmHg
Ao pk vel: 1.46 m/s
Area-P 1/2: 2.99 cm2
Calc EF: 53.2 %
Height: 64 in
S' Lateral: 3.4 cm
Single Plane A2C EF: 53.5 %
Single Plane A4C EF: 53.4 %
Weight: 2560 oz

## 2022-01-30 LAB — CBG MONITORING, ED
Glucose-Capillary: 142 mg/dL — ABNORMAL HIGH (ref 70–99)
Glucose-Capillary: 146 mg/dL — ABNORMAL HIGH (ref 70–99)

## 2022-01-30 LAB — GLUCOSE, CAPILLARY
Glucose-Capillary: 191 mg/dL — ABNORMAL HIGH (ref 70–99)
Glucose-Capillary: 178 mg/dL — ABNORMAL HIGH (ref 70–99)

## 2022-01-30 LAB — TSH: TSH: 1.946 u[IU]/mL (ref 0.350–4.500)

## 2022-01-30 LAB — TROPONIN I (HIGH SENSITIVITY)
Troponin I (High Sensitivity): 30 ng/L — ABNORMAL HIGH (ref <18)
Troponin I (High Sensitivity): 307 ng/L (ref <18)
Troponin I (High Sensitivity): 407 ng/L (ref <18)

## 2022-01-30 LAB — MAGNESIUM
Magnesium: 1.7 mg/dL (ref 1.7–2.4)
Magnesium: 1.7 mg/dL (ref 1.7–2.4)

## 2022-01-30 MED ORDER — ATORVASTATIN CALCIUM 40 MG PO TABS
40.0000 mg | ORAL_TABLET | Freq: Every evening | ORAL | Status: DC
  Administered 2022-01-30: 40 mg via ORAL
  Filled 2022-01-30: qty 1

## 2022-01-30 MED ORDER — ONDANSETRON HCL 4 MG PO TABS: 4.0000 mg | ORAL_TABLET | Freq: Four times a day (QID) | ORAL | Status: DC | PRN

## 2022-01-30 MED ORDER — ALOGLIPTIN BENZOATE 12.5 MG PO TABS: 12.5000 mg | ORAL_TABLET | Freq: Every day | ORAL | Status: DC

## 2022-01-30 MED ORDER — MAGNESIUM SULFATE IN D5W 1-5 GM/100ML-% IV SOLN
1.0000 g | Freq: Once | INTRAVENOUS | Status: DC
  Filled 2022-01-30: qty 100

## 2022-01-30 MED ORDER — MEMANTINE HCL 10 MG PO TABS
10.0000 mg | ORAL_TABLET | Freq: Two times a day (BID) | ORAL | Status: DC
  Administered 2022-01-30 – 2022-01-31 (×4): 10 mg via ORAL
  Filled 2022-01-30 (×5): qty 1

## 2022-01-30 MED ORDER — AMLODIPINE BESYLATE 5 MG PO TABS
5.0000 mg | ORAL_TABLET | Freq: Two times a day (BID) | ORAL | Status: DC
  Administered 2022-01-30 – 2022-01-31 (×4): 5 mg via ORAL
  Filled 2022-01-30 (×4): qty 1

## 2022-01-30 MED ORDER — HEPARIN SODIUM (PORCINE) 5000 UNIT/ML IJ SOLN
5000.0000 [IU] | Freq: Three times a day (TID) | INTRAMUSCULAR | Status: DC
Start: 2022-01-30 — End: 2022-01-31
  Administered 2022-01-30 – 2022-01-31 (×3): 5000 [IU] via SUBCUTANEOUS
  Filled 2022-01-30 (×4): qty 1

## 2022-01-30 MED ORDER — HALOPERIDOL LACTATE 5 MG/ML IJ SOLN
2.0000 mg | Freq: Four times a day (QID) | INTRAMUSCULAR | Status: DC | PRN
  Administered 2022-01-30: 2 mg via INTRAMUSCULAR
  Filled 2022-01-30: qty 1

## 2022-01-30 MED ORDER — INSULIN ASPART 100 UNIT/ML IJ SOLN: 0.0000 [IU] | Freq: Every day | INTRAMUSCULAR | Status: DC

## 2022-01-30 MED ORDER — LINAGLIPTIN 5 MG PO TABS
5.0000 mg | ORAL_TABLET | Freq: Every day | ORAL | Status: DC
Start: 2022-01-31 — End: 2022-01-31
  Administered 2022-01-31: 5 mg via ORAL
  Filled 2022-01-30: qty 1

## 2022-01-30 MED ORDER — ACETAMINOPHEN 325 MG PO TABS: 650.0000 mg | ORAL_TABLET | Freq: Four times a day (QID) | ORAL | Status: DC | PRN

## 2022-01-30 MED ORDER — ONDANSETRON HCL 4 MG/2ML IJ SOLN: 4.0000 mg | Freq: Four times a day (QID) | INTRAMUSCULAR | Status: DC | PRN

## 2022-01-30 MED ORDER — MAGNESIUM SULFATE 2 GM/50ML IV SOLN
2.0000 g | Freq: Once | INTRAVENOUS | Status: AC
  Administered 2022-01-30: 2 g via INTRAVENOUS
  Filled 2022-01-30: qty 50

## 2022-01-30 MED ORDER — CLOPIDOGREL BISULFATE 75 MG PO TABS
75.0000 mg | ORAL_TABLET | Freq: Every day | ORAL | Status: DC
  Administered 2022-01-30 – 2022-01-31 (×2): 75 mg via ORAL
  Filled 2022-01-30 (×2): qty 1

## 2022-01-30 MED ORDER — ASPIRIN 81 MG PO TBEC
81.0000 mg | DELAYED_RELEASE_TABLET | Freq: Every day | ORAL | Status: DC
  Administered 2022-01-30 – 2022-01-31 (×2): 81 mg via ORAL
  Filled 2022-01-30 (×2): qty 1

## 2022-01-30 MED ORDER — MELATONIN 5 MG PO TABS
10.0000 mg | ORAL_TABLET | Freq: Every evening | ORAL | Status: DC | PRN
  Administered 2022-01-30: 10 mg via ORAL
  Filled 2022-01-30: qty 2

## 2022-01-30 MED ORDER — HALOPERIDOL 1 MG PO TABS: 2.0000 mg | ORAL_TABLET | Freq: Four times a day (QID) | ORAL | Status: DC | PRN

## 2022-01-30 MED ORDER — HALOPERIDOL LACTATE 5 MG/ML IJ SOLN
4.0000 mg | Freq: Once | INTRAMUSCULAR | Status: DC | PRN
Start: 2022-01-30 — End: 2022-01-30

## 2022-01-30 MED ORDER — INSULIN ASPART 100 UNIT/ML IJ SOLN
0.0000 [IU] | Freq: Three times a day (TID) | INTRAMUSCULAR | Status: DC
  Administered 2022-01-30 (×2): 1 [IU] via SUBCUTANEOUS

## 2022-01-30 MED ORDER — POTASSIUM CHLORIDE CRYS ER 20 MEQ PO TBCR
40.0000 meq | EXTENDED_RELEASE_TABLET | ORAL | Status: AC
  Administered 2022-01-30: 40 meq via ORAL
  Filled 2022-01-30: qty 2

## 2022-01-30 MED ORDER — ACETAMINOPHEN 650 MG RE SUPP: 650.0000 mg | Freq: Four times a day (QID) | RECTAL | Status: DC | PRN

## 2022-01-30 MED ORDER — LOSARTAN POTASSIUM 50 MG PO TABS
100.0000 mg | ORAL_TABLET | Freq: Every day | ORAL | Status: DC
  Administered 2022-01-30 – 2022-01-31 (×2): 100 mg via ORAL
  Filled 2022-01-30 (×2): qty 2

## 2022-01-30 MED ORDER — POTASSIUM CHLORIDE CRYS ER 20 MEQ PO TBCR
20.0000 meq | EXTENDED_RELEASE_TABLET | Freq: Once | ORAL | Status: AC
  Administered 2022-01-30: 20 meq via ORAL
  Filled 2022-01-30: qty 1

## 2022-01-30 MED ORDER — METOPROLOL SUCCINATE ER 50 MG PO TB24
50.0000 mg | ORAL_TABLET | Freq: Every day | ORAL | Status: DC
  Administered 2022-01-30: 50 mg via ORAL
  Filled 2022-01-30: qty 1

## 2022-01-30 NOTE — ED Notes (Signed)
Pt agitated. Refusing EKG, labs, or medications.

## 2022-01-30 NOTE — Assessment & Plan Note (Signed)
Stable.  Continue Norvasc 5 mg twice daily, Toprol-XL 50 mg daily

## 2022-01-30 NOTE — ED Notes (Signed)
Pt is a hard stick. Notified Phlebotomy

## 2022-01-30 NOTE — ED Notes (Signed)
Pt assisted spouse changing pt linens, brief, and gown. Pt peed on floor while spouse was bathing. Spouse stated pt usually does well with alerting you when he has to use the restroom. Provided pt with urinal.

## 2022-01-30 NOTE — ED Notes (Signed)
RN provided pt spouse bathing supplies, gown and linen.

## 2022-01-30 NOTE — Assessment & Plan Note (Signed)
Replete with p.o. potassium.  Repeat BMP in the morning.  Could be the cause of his accelerated junctional rhythm.

## 2022-01-30 NOTE — ED Notes (Signed)
Pt pulled IV and walked around the room dripping blood everywhere. Son is at the bedside unable to redirect pt. RN and secondary RN changed linens, gown, socks, and wiped down room. Pt redirect in bed and stated he doesn't want the cords or want to be poked. Son stated he will call spouse after she has rested to come back to sit with pt.

## 2022-01-30 NOTE — ED Notes (Signed)
Pt has to be redirected to take medications. Please explain what you are doing and call wife if needed.

## 2022-01-30 NOTE — Subjective & Objective (Signed)
CC: Hypotension, tachycardia History of present illness: 88 year old African-American male history of coronary disease status post stents, type 2 diabetes, dementia, hypertension presents the ER today with onset of tachycardia, diaphoresis and hypotension while he was watching TV with his wife around 8 or 9:00 at night.  EMS called.  Wife states she checked his blood pressure which was low.  Heart rate was elevated with a heart rate of 130.  Patient complaining of not feeling well.  He was mildly diaphoretic.  EKG performed by EMS showed accelerated tachycardia rhythm with a heart rate of about 130.  Patient given IV fluids.  He spontaneously converted back to normal sinus rhythm before arriving to the ER.  Labs showed a low potassium 3.2.  Troponin mildly elevated at 30.  Patient sees Dr. Sharyn Lull for cardiology.  Due to slightly elevated troponin and accelerated tachycardia before arriving to the ER, [facility] hospitalist contacted for admission.

## 2022-01-30 NOTE — ED Notes (Signed)
OT at the bedside

## 2022-01-30 NOTE — Progress Notes (Signed)
Notified of increase in troponin to 307. Pt has no acute complaints. Plan to repeat EKG, trend troponin, follow-up echo results, continue Plavix, Lipitor, Toprol.

## 2022-01-30 NOTE — ED Notes (Signed)
Pt refused for to have another IV placed. Pt complained about getting vital signs and refused. RN was able to get pt to comply after ten minutes of explaining the importance of vitals. Pt refuse to keep telemetry cords on. Notified MD

## 2022-01-30 NOTE — Consult Note (Signed)
Referring Physician: Dr. Andria Meuse. Ghimire  Alexander Donaldson is an 88 y.o. male.                       Chief Complaint: Chest pain and weakness  HPI: 88 years old black male with PMH of CAD, type 2 DM, s/p stent and HTN had weakness and chest pressure. With diaphoresis and tachycardia prior to arrival to ER. Here his vital signs are normal and monitor shows sinus rhythm. His Troponin I levels are trending up at 307 ng. CXR was unremarkable. Echocardiogram is pending. He had RCA stent in 2018 and 2013. He wishes medical therapy at the present time due to advanced age.  Past Medical History:  Diagnosis Date   Acute encephalopathy 02/12/2018   CAP (community acquired pneumonia) 02/11/2018   Carpal tunnel syndrome, bilateral 02/25/2020   Coronary artery disease    Diabetes mellitus    Diverticulitis    Hypertension    Hypokalemia 02/12/2018      Past Surgical History:  Procedure Laterality Date   ANGIOPLASTY     CORONARY STENT INTERVENTION N/A 02/02/2017   Procedure: Coronary Stent Intervention;  Surgeon: Rinaldo Cloud, MD;  [facility] IN[facility] LAB;  Service: Cardiovascular;  Laterality: N/A;   CORONARY STENTS     LEFT HEART CATH AND CORONARY ANGIOGRAPHY N/A 02/02/2017   Procedure: Left Heart Cath and Coronary Angiography;  Surgeon: Rinaldo Cloud, MD;  Location: [facility] INVASIVE CV LAB;  Service: Cardiovascular;  Laterality: N/A;   LEFT HEART CATHETERIZATION WITH CORONARY ANGIOGRAM N/A 03/16/2012   Procedure: LEFT HEART CATHETERIZATION WITH CORONARY ANGIOGRAM;  Surgeon: Robynn Pane, MD;  Location[facility] CATH LAB;  Service: Cardiovascular;  Laterality: N/A;   PERCUTANEOUS CORONARY STENT INTERVENTION (PCI-S)  03/16/2012   Procedure: PERCUTANEOUS CORONARY STENT INTERVENTION (PCI-S);  Surgeon: Robynn Pane, MD;  Location: [facility] CATH LAB;  Service: Cardiovascular;;   TONSILLECTOMY      No family history on file. Social History:  reports that he has never smoked. He has never used smokeless  tobacco. He reports that he does not drink alcohol and does not use drugs.  Allergies:  Allergies  Allergen Reactions   Lisinopril     Other reaction(s): Cough   Metformin Diarrhea    (Not in a hospital admission)   Results for orders placed or performed during the hospital encounter of 01/29/22 (from the past 48 hour(s))  CBG monitoring, ED     Status: Abnormal   Collection Time: 01/29/22 11:27 PM  Result Value Ref Range   Glucose-Capillary 169 (H) 70 - 99 mg/dL    Comment: Glucose reference range applies only to samples taken after fasting for at least 8 hours.  CBC     Status: None   Collection Time: 01/29/22 11:58 PM  Result Value Ref Range   WBC 7.0 4.0 - 10.5 K/uL   RBC 4.67 4.22 - 5.81 MIL/uL   Hemoglobin 14.8 13.0 - 17.0 g/dL   HCT 31.5 17.6 - 16.0 %   MCV 90.1 80.0 - 100.0 fL   MCH 31.7 26.0 - 34.0 pg   MCHC 35.2 30.0 - 36.0 g/dL   RDW 73.7 10.6 - 26.9 %   Platelets 170 150 - 400 K/uL   nRBC 0.0 0.0 - 0.2 %    Comment: Performed at [facility] Lab, [address] [place], [place] [address]  Basic metabolic panel     Status: Abnormal   Collection Time: 01/29/22 11:58  PM  Result Value Ref Range   Sodium 137 135 - 145 mmol/L   Potassium 3.2 (L) 3.5 - 5.1 mmol/L   Chloride 103 98 - 111 mmol/L   CO2 24 22 - 32 mmol/L   Glucose, Bld 180 (H) 70 - 99 mg/dL    Comment: Glucose reference range applies only to samples taken after fasting for at least 8 hours.   BUN 24 (H) 8 - 23 mg/dL   Creatinine, Ser 3.14 0.61 - 1.24 mg/dL   Calcium 8.9 8.9 - 97.0 mg/dL   GFR, Estimated >26 >37 mL/min    Comment: (NOTE) Calculated using the CKD-EPI Creatinine Equation (2021)    Anion gap 10 5 - 15    Comment: Performed at [facility], [address], [place], [place] [address]  Troponin I (High Sensitivity)     Status: Abnormal   Collection Time: 01/29/22 11:58 PM  Result Value Ref Range   Troponin I (High Sensitivity) 30 (H) <18 ng/L    Comment: (NOTE) Elevated high  sensitivity troponin I (hsTnI) values and significant  changes across serial measurements may suggest ACS but many other  chronic and acute conditions are known to elevate hsTnI results.  Refer to the "Links" section for chest pain algorithms and additional  guidance. Performed at [facility], [address] [place], [place] [address]   Magnesium     Status: None   Collection Time: 01/29/22 11:58 PM  Result Value Ref Range   Magnesium 1.7 1.7 - 2.4 mg/dL    Comment: Performed at [facility], [address], [place], [place] [address]  Troponin I (High Sensitivity)     Status: Abnormal   Collection Time: 01/30/22  3:04 AM  Result Value Ref Range   Troponin I (High Sensitivity) 307 (HH) <18 ng/L    Comment: CRITICAL RESULT CALLED TO, READ BACK BY AND VERIFIED WITH I. TAYLOR RN, 0407, 01/30/22, EADEDOKUN DELTA CHECK NOTED (NOTE) Elevated high sensitivity troponin I (hsTnI) values and significant  changes across serial measurements may suggest ACS but many other  chronic and acute conditions are known to elevate hsTnI results.  Refer to the "Links" section for chest pain algorithms and additional  guidance. Performed at [facility], [address] [place], [place] [address]   TSH     Status: None   Collection Time: 01/30/22  3:04 AM  Result Value Ref Range   TSH 1.946 0.350 - 4.500 uIU/mL    Comment: Performed by a 3rd Generation assay with a functional sensitivity of <=0.01 uIU/mL. Performed at [facility], [address], [place], [place] [address]   Hemoglobin A1c     Status: Abnormal   Collection Time: 01/30/22  3:04 AM  Result Value Ref Range   Hgb A1c MFr Bld 7.2 (H) 4.8 - 5.6 %    Comment: (NOTE) Pre diabetes:          5.7%-6.4%  Diabetes:              >6.4%  Glycemic control for   <7.0% adults with diabetes    Mean Plasma Glucose 159.94 mg/dL    Comment: Performed at [facility] Lab, [address] [place], [place] [address]  CBG  monitoring, ED     Status: Abnormal   Collection Time: 01/30/22  8:28 AM  Result Value Ref Range   Glucose-Capillary 142 (H) 70 - 99 mg/dL    Comment: Glucose reference  range applies only to samples taken after fasting for at least 8 hours.   DG Chest Port 1 View  Result Date: 01/29/2022 CLINICAL DATA:  Tachycardia EXAM: PORTABLE CHEST 1 VIEW COMPARISON:  11/30/2021 FINDINGS: Lungs are clear.  No pleural effusion or pneumothorax. The heart is normal in size. IMPRESSION: No evidence of acute cardiopulmonary disease. Electronically Signed   By: Charline Bills M.D.   On: 01/29/2022 23:50    Review Of Systems Constitutional: No fever, chills, weight loss or gain. Eyes: No vision change, wears glasses. No discharge or pain. Ears: Positive hearing loss, No tinnitus. Respiratory: No asthma, COPD, pneumonias. Positive shortness of breath. No hemoptysis. Cardiovascular: Positive chest pain, palpitation, leg edema. Gastrointestinal: No nausea, vomiting, diarrhea, constipation. No GI bleed. No hepatitis. Genitourinary: No dysuria, hematuria, kidney stone. No incontinance. Neurological: No headache, stroke, seizures.  Psychiatry: No psych facility admission for anxiety, depression, suicide. No detox. Skin: No rash. Musculoskeletal: Positive joint pain, fibromyalgia. No neck pain, back pain. Lymphadenopathy: No lymphadenopathy. Hematology: No anemia or easy bruising.   Blood pressure (!) 148/71, pulse 71, temperature 97.7 F (36.5 C), temperature source Oral, resp. rate 13, height 5\' 4"  (1.626 m), weight 72.6 kg, SpO2 96 %. Body mass index is 27.46 kg/m. General appearance: alert, cooperative, appears stated age and no distress Head: Normocephalic, atraumatic. Eyes: Brown, pink conjunctiva, corneas clear.  Neck: No adenopathy, no carotid bruit, no JVD, supple, symmetrical, trachea midline and thyroid not enlarged. Resp: Clear to auscultation bilaterally. Cardio: Regular rate and rhythm, S1,  S2 normal, II/VI systolic murmur, no click, rub or gallop GI: Soft, non-tender; bowel sounds normal; no organomegaly. Extremities: Trace edema, no cyanosis or clubbing. Skin: Warm and dry.  Neurologic: Alert and oriented X 2, normal strength. Normal coordination.  Assessment/Plan Acute coronary syndrome/NSTEMI CAD S/P Coronary stents Type 2 DM  Plan: Continue current treatment. Medical treatment v/s cardiac intervention per Dr. and patient.  Time spent: Review of old records, Lab, x-rays, EKG, other cardiac tests, examination, discussion with patient/Wife/Doctor/Nurse over 70 minutes.  Sharyn Lull, MD  01/30/2022, 9:36 AM

## 2022-01-30 NOTE — ED Notes (Signed)
Pt eating breakfast on side of bed with spouse.

## 2022-01-30 NOTE — H&P (Signed)
History and Physical    Alexander Donaldson ZOX:[id number] DOB: 01-26-1934 DOA: 01/29/2022  DOS: the patient was seen and examined on 01/29/2022  PCP: Doreene Nest, NP   Patient coming from: Home  I have personally briefly reviewed patient's old medical records in [facility]  CC: Hypotension, tachycardia History of present illness: 88 year old African-American male history of coronary disease status post stents, type 2 diabetes, dementia, hypertension presents the ER today with onset of tachycardia, diaphoresis and hypotension while he was watching TV with his wife around 8 or 9:00 at night.  EMS called.  Wife states she checked his blood pressure which was low.  Heart rate was elevated with a heart rate of 130.  Patient complaining of not feeling well.  He was mildly diaphoretic.  EKG performed by EMS showed accelerated tachycardia rhythm with a heart rate of about 130.  Patient given IV fluids.  He spontaneously converted back to normal sinus rhythm before arriving to the ER.  Labs showed a low potassium 3.2.  Troponin mildly elevated at 30.  Patient sees Dr. Sharyn Lull for cardiology.  Due to slightly elevated troponin and accelerated tachycardia before arriving to the ER, [facility] hospitalist contacted for admission.   ED Course: Work-up includes low serum potassium 3.2.  Troponin mildly elevated at 30.  Review of Systems:  Review of Systems  Unable to perform ROS: Dementia    Past Medical History:  Diagnosis Date   Acute encephalopathy 02/12/2018   CAP (community acquired pneumonia) 02/11/2018   Carpal tunnel syndrome, bilateral 02/25/2020   Coronary artery disease    Diabetes mellitus    Diverticulitis    Hypertension    Hypokalemia 02/12/2018    Past Surgical History:  Procedure Laterality Date   ANGIOPLASTY     CORONARY STENT INTERVENTION N/A 02/02/2017   Procedure: Coronary Stent Intervention;  Surgeon: Rinaldo Cloud, MD;  Location: [facility] INVASIVE CV LAB;  Service:  Cardiovascular;  Laterality: N/A;   CORONARY STENTS     LEFT HEART CATH AND CORONARY ANGIOGRAPHY N/A 02/02/2017   Procedure: Left Heart Cath and Coronary Angiography;  Surgeon: Rinaldo Cloud, MD;  Location[facility] INVASIVE CV LAB;  Service: Cardiovascular;  Laterality: N/A;   LEFT HEART CATHETERIZATION WITH CORONARY ANGIOGRAM N/A 03/16/2012   Procedure: LEFT HEART CATHETERIZATION WITH CORONARY ANGIOGRAM;  Surgeon: Robynn Pane, MD;  Location: [facility] CATH LAB;  Service: Cardiovascular;  Laterality: N/A;   PERCUTANEOUS CORONARY STENT INTERVENTION (PCI-S)  03/16/2012   Procedure: PERCUTANEOUS CORONARY STENT INTERVENTION (PCI-S);  Surgeon: Robynn Pane, MD;  Location: [facility] CATH LAB;  Service: Cardiovascular;;   TONSILLECTOMY       reports that he has never smoked. He has never used smokeless tobacco. He reports that he does not drink alcohol and does not use drugs.  Allergies  Allergen Reactions   Lisinopril     Other reaction(s): Cough   Metformin Diarrhea    No family history on file.  Prior to Admission medications   Medication Sig Start Date End Date Taking? Authorizing Provider  acetaminophen (TYLENOL) 500 MG tablet Take 500 mg by mouth every 6 (six) hours as needed for moderate pain.    [provider]  Alogliptin Benzoate 12.5 MG TABS TAKE ONE TABLET BY MOUTH EVERY DAY FOR DIABETES *REPLACES GLIPIZIDE* 03/30/21   [provider]  amLODipine (NORVASC) 10 MG tablet Take 5 mg by mouth 2 (two) times daily.    [provider]  aspirin 81 MG tablet Take 81 mg  by mouth daily.    [provider]  atorvastatin (LIPITOR) 80 MG tablet Take 40 mg by mouth every evening.    [provider]  clopidogrel (PLAVIX) 75 MG tablet Take 75 mg by mouth daily. 01/02/17   [provider]  diclofenac sodium (VOLTAREN) 1 % GEL Apply 2 g topically 4 (four) times daily as needed (pain). 11/01/18   Melene Plan, DO  losartan (COZAAR) 100 MG tablet Take 100 mg by mouth  daily.     [provider]  memantine (NAMENDA) 10 MG tablet Take 1 tablet (10 mg total) by mouth 2 (two) times daily. Start only after first finishing starter pack 05/24/21   Micki Riley, MD  metoprolol succinate (TOPROL-XL) 50 MG 24 hr tablet Take 50 mg by mouth at bedtime. Take with or immediately following a meal.    [provider]  Multiple Vitamin (MULTIVITAMIN) tablet Take 1 tablet by mouth daily.    [provider]  nitroGLYCERIN (NITROSTAT) 0.4 MG SL tablet Place 1 tablet (0.4 mg total) under the tongue every 5 (five) minutes x 3 doses as needed for chest pain. 03/18/12 01/06/21  Rinaldo Cloud, MD  psyllium (HYDROCIL/METAMUCIL) 95 % PACK Take 1 packet by mouth 2 (two) times daily.     [provider]    Physical Exam: Vitals:   01/29/22 2345 01/30/22 0030 01/30/22 0045 01/30/22 0100  BP: 134/71 110/64 106/63 120/84  Pulse: 98 84 84 84  Resp: 15 10 17 17   Temp:      TempSrc:      SpO2: 94% 97% 98% 98%  Weight:      Height:        Physical Exam Vitals and nursing note reviewed.  Constitutional:      General: He is not in acute distress.    Appearance: Normal appearance. He is not ill-appearing, toxic-appearing or diaphoretic.  HENT:     Head: Normocephalic and atraumatic.     Nose: Nose normal. No rhinorrhea.  Eyes:     General: No scleral icterus. Cardiovascular:     Rate and Rhythm: Normal rate and regular rhythm.     Pulses: Normal pulses.  Pulmonary:     Effort: Pulmonary effort is normal. No respiratory distress.     Breath sounds: Normal breath sounds. No wheezing or rales.  Abdominal:     General: Bowel sounds are normal. There is no distension.     Palpations: Abdomen is soft.     Tenderness: There is no abdominal tenderness.  Musculoskeletal:     Right lower leg: No edema.     Left lower leg: No edema.  Skin:    General: Skin is warm and dry.     Capillary Refill: Capillary refill takes less than 2 seconds.   Neurological:     General: No focal deficit present.     Mental Status: He is alert.     Comments: Oriented only to person.  Knows his wife.  Not oriented to situation, time or place.      Labs on Admission: I have personally reviewed following labs and imaging studies  CBC: Recent Labs  Lab 01/29/22 2358  WBC 7.0  HGB 14.8  HCT 42.1  MCV 90.1  PLT 170   Basic Metabolic Panel: Recent Labs  Lab 01/29/22 2358  NA 137  K 3.2*  CL 103  CO2 24  GLUCOSE 180*  BUN 24*  CREATININE 1.15  CALCIUM 8.9  MG 1.7  GFR: Estimated Creatinine Clearance: 42.1 mL/min (by C-G formula based on SCr of 1.15 mg/dL). Liver Function Tests: No results for input(s): "AST", "ALT", "ALKPHOS", "BILITOT", "PROT", "ALBUMIN" in the last 168 hours. No results for input(s): "LIPASE", "AMYLASE" in the last 168 hours. No results for input(s): "AMMONIA" in the last 168 hours. Coagulation Profile: No results for input(s): "INR", "PROTIME" in the last 168 hours. Cardiac Enzymes: Recent Labs  Lab 01/29/22 2358  TROPONINIHS 30*   BNP (last 3 results) No results for input(s): "PROBNP" in the last 8760 hours. HbA1C: No results for input(s): "HGBA1C" in the last 72 hours. CBG: Recent Labs  Lab 01/29/22 2327  GLUCAP 169*   Lipid Profile: No results for input(s): "CHOL", "HDL", "LDLCALC", "TRIG", "CHOLHDL", "LDLDIRECT" in the last 72 hours. Thyroid Function Tests: No results for input(s): "TSH", "T4TOTAL", "FREET4", "T3FREE", "THYROIDAB" in the last 72 hours. Anemia Panel: No results for input(s): "VITAMINB12", "FOLATE", "FERRITIN", "TIBC", "IRON", "RETICCTPCT" in the last 72 hours. Urine analysis:    Component Value Date/Time   COLORURINE YELLOW 01/06/2021 0952   APPEARANCEUR CLEAR 01/06/2021 0952   LABSPEC 1.013 01/06/2021 0952   PHURINE 7.0 01/06/2021 0952   GLUCOSEU NEGATIVE 01/06/2021 0952   HGBUR NEGATIVE 01/06/2021 0952   BILIRUBINUR NEGATIVE 01/06/2021 0952   BILIRUBINUR Negative  01/03/2019 0953   KETONESUR NEGATIVE 01/06/2021 0952   PROTEINUR NEGATIVE 01/06/2021 0952   UROBILINOGEN 0.2 01/03/2019 0953   NITRITE NEGATIVE 01/06/2021 0952   LEUKOCYTESUR NEGATIVE 01/06/2021 0952    Radiological Exams on Admission: I have personally reviewed images DG Chest Port 1 View  Result Date: 01/29/2022 CLINICAL DATA:  Tachycardia EXAM: PORTABLE CHEST 1 VIEW COMPARISON:  11/30/2021 FINDINGS: Lungs are clear.  No pleural effusion or pneumothorax. The heart is normal in size. IMPRESSION: No evidence of acute cardiopulmonary disease. Electronically Signed   By: Charline Bills M.D.   On: 01/29/2022 23:50    EKG: My personal interpretation of EKG shows: NSR    Assessment/Plan Principal Problem:   Accelerated junctional rhythm Active Problems:   Hypokalemia   CAD (coronary artery disease)   Type 2 diabetes mellitus with vascular disease (HCC)   Essential hypertension   Unspecified dementia, unspecified severity, without behavioral disturbance, psychotic disturbance, mood disturbance, and anxiety (HCC)    Assessment and Plan: * Accelerated junctional rhythm Observation cardiac telemetry bed. Serial troponin. Check echo. Continue ASA 81 mg, lipitor 80 mg and toprol xl 50 mg   Hypokalemia Replete with p.o. potassium.  Repeat BMP in the morning.  Could be the cause of his accelerated junctional rhythm.  Unspecified dementia, unspecified severity, without behavioral disturbance, psychotic disturbance, mood disturbance, and anxiety (HCC) Chronic.  Continue Namenda.  Essential hypertension Stable.  Continue Norvasc 5 mg twice daily, Toprol-XL 50 mg daily  Type 2 diabetes mellitus with vascular disease (HCC) Stable.  Check A1c.  Add sliding scale insulin.  CAD (coronary artery disease) Continue aspirin 81 mg daily, Lipitor 80 mg daily, Toprol-XL 50 mg daily.   DVT prophylaxis: SQ Heparin Code Status: Full Code Family Communication: discussed with pt and wife at  bedside  Disposition Plan: return home  Consults called: none  Admission status: Observation, Telemetry bed   Carollee Herter, DO [facility] 01/30/2022, 1:59 AM

## 2022-01-30 NOTE — Assessment & Plan Note (Signed)
Stable.  Check A1c.  Add sliding scale insulin. 

## 2022-01-30 NOTE — ED Notes (Signed)
Pt spouse stated that pt will take remaining medications in an hour. They like to stay on routine.

## 2022-01-30 NOTE — Progress Notes (Signed)
PROGRESS NOTE        PATIENT DETAILS Name: Alexander Donaldson Age: 88 y.o. Sex: male Date of Birth: 1933/10/17 Admit Date: 01/29/2022 Admitting Physician Carollee Herter, DO [id number], Keane Scrape, NP  Brief Summary: Patient is a 88 y.o.  male history of CAD s/p PCI (last in 2018) DM-2, HTN, dementia who presented with a episode of diaphoresis, tachycardia, chest discomfort-patient was found to have non-STEMI and subsequently admitted to the hospitalist service.  Significant events: 7/22>> chest pain/tachycardia/diaphoresis-elevated troponins-admit to [facility].  Significant studies: 7/22>> CXR: No PNA  Significant microbiology data:   Procedures: None  Consults: Cardiology  Subjective: Lying comfortably in bed-denies any chest pain or shortness of breath.  Objective: Vitals: Blood pressure (!) 148/71, pulse 71, temperature 97.7 F (36.5 C), temperature source Oral, resp. rate 13, height 5\' 4"  (1.626 m), weight 72.6 kg, SpO2 96 %.   Exam: Gen Exam:Alert awake-not in any distress HEENT:atraumatic, normocephalic Chest: B/L clear to auscultation anteriorly CVS:S1S2 regular Abdomen:soft non tender, non distended Extremities:no edema Neurology: Non focal Skin: no rash  Pertinent Labs/Radiology:    Latest Ref Rng & Units 01/29/2022   11:58 PM 11/30/2021    9:20 AM 01/06/2021    5:44 AM  CBC  WBC 4.0 - 10.5 K/uL 7.0  5.4  7.2   Hemoglobin 13.0 - 17.0 g/dL 01/08/2021  68.3  41.9   Hematocrit 39.0 - 52.0 % 42.1  42.6  42.4   Platelets 150 - 400 K/uL 170  165  178     Lab Results  Component Value Date   NA 137 01/30/2022   K 3.9 01/30/2022   CL 102 01/30/2022   CO2 27 01/30/2022      Assessment/Plan: Non-STEMI: No further chest pain-on aspirin/Plavix/statin/beta-blocker-await echo-cardiology following-ongoing conversations with patient/family regarding invasive versus conservative management-we will defer further to cardiology service.  Accelerated  junctional rhythm: In the setting of above-resolved.  Continue telemetry monitoring.  DM-2 (A1c 7.17/23): CBG stable on SSI-resume oral hypoglycemic agents on discharge.  Recent Labs    01/29/22 2327 01/30/22 0828  GLUCAP 169* 142*    HTN: BP reasonable-continue metoprolol, losartan and amlodipine.  Follow and optimize.  HLD: Continue statin  Dementia: Appears to be mild-awake and alert this morning-continue Namenda.  BMI: Estimated body mass index is 27.46 kg/m as calculated from the following:   Height as of this encounter: 5\' 4"  (1.626 m).   Weight as of this encounter: 72.6 kg.   Code status:   Code Status: Full Code   DVT Prophylaxis: heparin injection 5,000 Units Start: 01/30/22 0600 SCDs Start: 01/30/22 0208   Family Communication: Spouse at bedside   Disposition Plan: Status is: Observation The patient will require care spanning > 2 midnights and should be moved to inpatient because: Non-STEMI-ongoing conversations regarding invasive/conservative management-awaiting echo-not yet stable for discharge.   Planned Discharge Destination:Home   Diet: Diet Order             Diet heart healthy/carb modified Room service appropriate? Yes; Fluid consistency: Thin  Diet effective now                     Antimicrobial agents: Anti-infectives (From admission, onward)    None        MEDICATIONS: Scheduled Meds:  Alogliptin Benzoate  12.5 mg Oral Q breakfast   amLODipine  5  mg Oral BID   aspirin EC  81 mg Oral Daily   atorvastatin  40 mg Oral QPM   clopidogrel  75 mg Oral Daily   heparin  5,000 Units Subcutaneous Q8H   insulin aspart  0-5 Units Subcutaneous QHS   insulin aspart  0-9 Units Subcutaneous TID WC   losartan  100 mg Oral Daily   memantine  10 mg Oral BID   metoprolol succinate  50 mg Oral QHS   Continuous Infusions: PRN Meds:.acetaminophen **OR** acetaminophen, haloperidol lactate, melatonin, ondansetron **OR** ondansetron (ZOFRAN)  IV   I have personally reviewed following labs and imaging studies  LABORATORY DATA: CBC: Recent Labs  Lab 01/29/22 2358  WBC 7.0  HGB 14.8  HCT 42.1  MCV 90.1  PLT 170    Basic Metabolic Panel: Recent Labs  Lab 01/29/22 2358 01/30/22 0910  NA 137 137  K 3.2* 3.9  CL 103 102  CO2 24 27  GLUCOSE 180* 203*  BUN 24* 18  CREATININE 1.15 0.98  CALCIUM 8.9 8.8*  MG 1.7 1.7    GFR: Estimated Creatinine Clearance: 49.4 mL/min (by C-G formula based on SCr of 0.98 mg/dL).  Liver Function Tests: Recent Labs  Lab 01/30/22 0910  AST 23  ALT 14  ALKPHOS 60  BILITOT 1.2  PROT 6.8  ALBUMIN 3.8   No results for input(s): "LIPASE", "AMYLASE" in the last 168 hours. No results for input(s): "AMMONIA" in the last 168 hours.  Coagulation Profile: No results for input(s): "INR", "PROTIME" in the last 168 hours.  Cardiac Enzymes: No results for input(s): "CKTOTAL", "CKMB", "CKMBINDEX", "TROPONINI" in the last 168 hours.  BNP (last 3 results) No results for input(s): "PROBNP" in the last 8760 hours.  Lipid Profile: No results for input(s): "CHOL", "HDL", "LDLCALC", "TRIG", "CHOLHDL", "LDLDIRECT" in the last 72 hours.  Thyroid Function Tests: Recent Labs    01/30/22 0304  TSH 1.946    Anemia Panel: No results for input(s): "VITAMINB12", "FOLATE", "FERRITIN", "TIBC", "IRON", "RETICCTPCT" in the last 72 hours.  Urine analysis:    Component Value Date/Time   COLORURINE YELLOW 01/06/2021 0952   APPEARANCEUR CLEAR 01/06/2021 0952   LABSPEC 1.013 01/06/2021 0952   PHURINE 7.0 01/06/2021 0952   GLUCOSEU NEGATIVE 01/06/2021 0952   HGBUR NEGATIVE 01/06/2021 0952   BILIRUBINUR NEGATIVE 01/06/2021 0952   BILIRUBINUR Negative 01/03/2019 0953   KETONESUR NEGATIVE 01/06/2021 0952   PROTEINUR NEGATIVE 01/06/2021 0952   UROBILINOGEN 0.2 01/03/2019 0953   NITRITE NEGATIVE 01/06/2021 0952   LEUKOCYTESUR NEGATIVE 01/06/2021 0952    Sepsis Labs: Lactic Acid, Venous     Component Value Date/Time   LATICACIDVEN 1.44 02/11/2018 2038    MICROBIOLOGY: No results found for this or any previous visit (from the past 240 hour(s)).  RADIOLOGY STUDIES/RESULTS: DG Chest Port 1 View  Result Date: 01/29/2022 CLINICAL DATA:  Tachycardia EXAM: PORTABLE CHEST 1 VIEW COMPARISON:  11/30/2021 FINDINGS: Lungs are clear.  No pleural effusion or pneumothorax. The heart is normal in size. IMPRESSION: No evidence of acute cardiopulmonary disease. Electronically Signed   By: Charline Bills M.D.   On: 01/29/2022 23:50     LOS: 0 days   Jeoffrey Massed, MD  [facility]    To contact the attending provider between 7A-7P or the covering provider during after hours 7P-7A, please log into the web site [email or web address] and access using universal [place] password for that web site. If you do not have the password, please call the hospital operator.  01/30/2022, 10:45 AM

## 2022-01-30 NOTE — Assessment & Plan Note (Signed)
Observation cardiac telemetry bed. Serial troponin. Check echo. Continue ASA 81 mg, lipitor 80 mg and toprol xl 50 mg

## 2022-01-30 NOTE — Assessment & Plan Note (Signed)
Continue aspirin 81 mg daily, Lipitor 80 mg daily, Toprol-XL 50 mg daily.

## 2022-01-30 NOTE — Assessment & Plan Note (Signed)
Chronic. ?Continue Namenda. ?

## 2022-01-30 NOTE — ED Notes (Signed)
Pt d/c magnesium. Son is at the bedside and he stated he tried to stop pt but unable to redirect. RN flushed IV and wrapped site with coban. Magnesium bag empty.

## 2022-01-30 NOTE — Evaluation (Signed)
Occupational Therapy Evaluation Patient Details Name: Alexander Donaldson MRN: [id number] DOB: 11-Sep-1933 Today's Date: 01/30/2022   History of Present Illness 88 y.o. M admitted on 01/29/22 due to hypotension and tachycardia. PMH significant for coronary disease status post stents, type 2 diabetes, dementia, hypertension.   Clinical Impression   Pt admitted for concerns listed above. PTA pt reported that he was independent with all ADL's and IADL's. At this time, pt presents near his baseline. He is able to complete ADL's and functional mobility with no assist. Pt has no further skilled OT needs, acute OT will sign off.      Recommendations for follow up therapy are one component of a multi-disciplinary discharge planning process, led by the attending physician.  Recommendations may be updated based on patient status, additional functional criteria and insurance authorization.   Follow Up Recommendations  No OT follow up    Assistance Recommended at Discharge PRN  Patient can return home with the following A little help with bathing/dressing/bathroom;Assistance with cooking/housework;Direct supervision/assist for medications management;Direct supervision/assist for financial management    Functional Status Assessment  Patient has had a recent decline in their functional status and demonstrates the ability to make significant improvements in function in a reasonable and predictable amount of time.  Equipment Recommendations  None recommended by OT    Recommendations for Other Services       Precautions / Restrictions Precautions Precautions: Fall Restrictions Weight Bearing Restrictions: No      Mobility Bed Mobility               General bed mobility comments: Up sitting EOB    Transfers Overall transfer level: Modified independent Equipment used: None                      Balance Overall balance assessment: No apparent balance deficits (not formally  assessed)                                         ADL either performed or assessed with clinical judgement   ADL Overall ADL's : At baseline;Modified independent                                       General ADL Comments: No physical assist needed.     Vision Baseline Vision/History: 0 No visual deficits Ability to See in Adequate Light: 0 Adequate Patient Visual Report: No change from baseline Vision Assessment?: No apparent visual deficits     Perception     Praxis      Pertinent Vitals/Pain Pain Assessment Pain Assessment: No/denies pain     Hand Dominance Right   Extremity/Trunk Assessment Upper Extremity Assessment Upper Extremity Assessment: Overall WFL for tasks assessed   Lower Extremity Assessment Lower Extremity Assessment: Overall WFL for tasks assessed   Cervical / Trunk Assessment Cervical / Trunk Assessment: Normal   Communication Communication Communication: No difficulties   Cognition Arousal/Alertness: Awake/alert Behavior During Therapy: WFL for tasks assessed/performed Overall Cognitive Status: History of cognitive impairments - at baseline                                 General Comments: Pt has history of dementia     General Comments  VSS on RA    Exercises     Shoulder Instructions      Home Living Family/patient expects to be discharged to:: Private residence Living Arrangements: Spouse/significant other Available Help at Discharge: Family;Available 24 hours/day Type of Home: House Home Access: Ramped entrance     Home Layout: One level     Bathroom Shower/Tub: Chief Strategy Officer: Standard     Home Equipment: Grab bars - toilet;Grab bars - tub/shower;Shower seat;Cane - single Librarian, academic (2 wheels)          Prior Functioning/Environment Prior Level of Function : Independent/Modified Independent             Mobility Comments: uses a cane,  fatigues quickly ADLs Comments: Indep with ADL's, wife assists with verbal cuing and all IADL's.        OT Problem List: Decreased strength;Decreased activity tolerance;Impaired balance (sitting and/or standing)      OT Treatment/Interventions:      OT Goals(Current goals can be found in the care plan section) Acute Rehab OT Goals Patient Stated Goal: To go home OT Goal Formulation: All assessment and education complete, DC therapy Time For Goal Achievement: 01/30/22 Potential to Achieve Goals: Good  OT Frequency:      Co-evaluation              AM-PAC OT "6 Clicks" Daily Activity     Outcome Measure Help from another person eating meals?: None Help from another person taking care of personal grooming?: None Help from another person toileting, which includes using toliet, bedpan, or urinal?: None Help from another person bathing (including washing, rinsing, drying)?: None Help from another person to put on and taking off regular upper body clothing?: None Help from another person to put on and taking off regular lower body clothing?: None 6 Click Score: 24   End of Session Nurse Communication: Mobility status  Activity Tolerance: Patient tolerated treatment well Patient left: in bed;with call bell/phone within reach;with family/visitor present  OT Visit Diagnosis: Unsteadiness on feet (R26.81);Muscle weakness (generalized) (M62.81);Other abnormalities of gait and mobility (R26.89)                Time: 7124-5809 OT Time Calculation (min): 25 min Charges:  OT General Charges $OT Visit: 1 Visit OT Evaluation $OT Eval Moderate Complexity: 1 Mod OT Treatments $Self Care/Home Management : 8-22 mins  Jersey Espinoza H., OTR/L Acute Rehabilitation  Alphonsine Minium Elane Bing Plume 01/30/2022, 6:28 PM

## 2022-01-31 DIAGNOSIS — R748 Abnormal levels of other serum enzymes: Secondary | ICD-10-CM | POA: Diagnosis not present

## 2022-01-31 DIAGNOSIS — E785 Hyperlipidemia, unspecified: Secondary | ICD-10-CM | POA: Diagnosis not present

## 2022-01-31 DIAGNOSIS — I10 Essential (primary) hypertension: Secondary | ICD-10-CM | POA: Diagnosis not present

## 2022-01-31 DIAGNOSIS — E1159 Type 2 diabetes mellitus with other circulatory complications: Secondary | ICD-10-CM | POA: Diagnosis not present

## 2022-01-31 DIAGNOSIS — I214 Non-ST elevation (NSTEMI) myocardial infarction: Secondary | ICD-10-CM

## 2022-01-31 DIAGNOSIS — I498 Other specified cardiac arrhythmias: Secondary | ICD-10-CM | POA: Diagnosis not present

## 2022-01-31 DIAGNOSIS — I2510 Atherosclerotic heart disease of native coronary artery without angina pectoris: Secondary | ICD-10-CM | POA: Diagnosis not present

## 2022-01-31 DIAGNOSIS — E119 Type 2 diabetes mellitus without complications: Secondary | ICD-10-CM | POA: Diagnosis not present

## 2022-01-31 LAB — GLUCOSE, CAPILLARY
Glucose-Capillary: 128 mg/dL — ABNORMAL HIGH (ref 70–99)
Glucose-Capillary: 153 mg/dL — ABNORMAL HIGH (ref 70–99)

## 2022-01-31 LAB — BASIC METABOLIC PANEL
Anion gap: 7 (ref 5–15)
BUN: 19 mg/dL (ref 8–23)
CO2: 26 mmol/L (ref 22–32)
Calcium: 8.4 mg/dL — ABNORMAL LOW (ref 8.9–10.3)
Chloride: 102 mmol/L (ref 98–111)
Creatinine, Ser: 1.06 mg/dL (ref 0.61–1.24)
GFR, Estimated: >60 mL/min (ref >60)
Glucose, Bld: 126 mg/dL — ABNORMAL HIGH (ref 70–99)
Potassium: 3.9 mmol/L (ref 3.5–5.1)
Sodium: 135 mmol/L (ref 135–145)

## 2022-01-31 LAB — MAGNESIUM: Magnesium: 2.1 mg/dL (ref 1.7–2.4)

## 2022-01-31 NOTE — Care Management Obs Status (Signed)
MEDICARE OBSERVATION STATUS NOTIFICATION   Patient Details  Name: Alexander Donaldson MRN: [id number] Date of Birth: 03/04/1934   Medicare Observation Status Notification Given:       [name], RN 01/31/2022, 11:12 AM

## 2022-01-31 NOTE — Discharge Summary (Signed)
PATIENT DETAILS Name: Alexander Donaldson Age: 88 y.o. Sex: male Date of Birth: 10-17-1933 MRN: [id number]. Admitting Physician: Carollee Herter, DO LEX:NTZGY, Keane Scrape, NP  Admit Date: 01/29/2022 Discharge date: 01/31/2022  Recommendations for Outpatient Follow-up:  Follow up with PCP in 1-2 weeks Please obtain CMP/CBC in one week  Admitted From:  Home  Disposition: Home   Discharge Condition: fair  CODE STATUS:   Code Status: Full Code   Diet recommendation:  Diet Order             Diet - low sodium heart healthy           Diet Carb Modified           Diet heart healthy/carb modified Room service appropriate? Yes; Fluid consistency: Thin  Diet effective now                    Brief Summary: Patient is a 88 y.o.  male history of CAD s/p PCI (last in 2018) DM-2, HTN, dementia who presented with a episode of diaphoresis, tachycardia, chest discomfort-patient was found to have non-STEMI and subsequently admitted to the hospitalist service.   Significant events: 7/22>> chest pain/tachycardia/diaphoresis-elevated troponins-admit to [facility].   Significant studies: 7/22>> CXR: No PNA 7/23>> Echo: EF 50-55%, grade 1 diastolic dysfunction, severe asymmetric left basal hypertrophy of the basal septal segment.   Significant microbiology data:     Procedures: None   Consults: Cardiology  Brief Hospital Course: Non-STEMI: No further chest pain-Echo stable-cardiology spoke with patient/family who wants to pursue noninvasive management-Dr. Sharyn Lull recommends that we continue aspirin/Plavix/statin/beta-blocke on discharge.  No further chest pain.  Per cardiology-okay for discharge.  Accelerated junctional rhythm: In the setting of above-resolved.     DM-2 (A1c 7.17/23): CBG stable on SSI-resume oral hypoglycemic agents on discharge.  HTN: BP reasonable-continue metoprolol, losartan and amlodipine.  Follow and optimize.   HLD: Continue statin   Dementia: Had sundowning  last night-spouse at bedside and as result he is awake/alert and very calm.  Continue Namenda.  Suspect will continue to have sundowning as long as he is hospitalized.   BMI: Estimated body mass index is 27.46 kg/m as calculated from the following:   Height as of this encounter: 5\' 4"  (1.626 m).   Weight as of this encounter: 72.6 kg.    Discharge Diagnoses:  Principal Problem:   Accelerated junctional rhythm Active Problems:   Hypokalemia   CAD (coronary artery disease)   Type 2 diabetes mellitus with vascular disease (HCC)   Essential hypertension   Unspecified dementia, unspecified severity, without behavioral disturbance, psychotic disturbance, mood disturbance, and anxiety (HCC)   Discharge Instructions:  Activity:  As tolerated   Discharge Instructions     Call MD for:  difficulty breathing, headache or visual disturbances   Complete by: As directed    Call MD for:  extreme fatigue   Complete by: As directed    Call MD for:  persistant dizziness or light-headedness   Complete by: As directed    Call MD for:  severe uncontrolled pain   Complete by: As directed    Diet - low sodium heart healthy   Complete by: As directed    Diet Carb Modified   Complete by: As directed    Discharge instructions   Complete by: As directed    Follow with Primary MD  , NP in 1-2 weeks  Please get a complete blood count and chemistry panel checked by your  Primary MD at your next visit, and again as instructed by your Primary MD.  Get Medicines reviewed and adjusted: Please take all your medications with you for your next visit with your Primary MD  Laboratory/radiological data: Please request your Primary MD to go over all hospital tests and procedure/radiological results at the follow up, please ask your Primary MD to get all Hospital records sent to his/her office.  In some cases, they will be blood work, cultures and biopsy results pending at the time of your  discharge. Please request that your primary care M.D. follows up on these results.  Also Note the following: If you experience worsening of your admission symptoms, develop shortness of breath, life threatening emergency, suicidal or homicidal thoughts you must seek medical attention immediately by calling 911 or calling your MD immediately  if symptoms less severe.  You must read complete instructions/literature along with all the possible adverse reactions/side effects for all the Medicines you take and that have been prescribed to you. Take any new Medicines after you have completely understood and accpet all the possible adverse reactions/side effects.   Do not drive when taking Pain medications or sleeping medications (Benzodaizepines)  Do not take more than prescribed Pain, Sleep and Anxiety Medications. It is not advisable to combine anxiety,sleep and pain medications without talking with your primary care practitioner  Special Instructions: If you have smoked or chewed Tobacco  in the last 2 yrs please stop smoking, stop any regular Alcohol  and or any Recreational drug use.  Wear Seat belts while driving.  Please note: You were cared for by a hospitalist during your hospital stay. Once you are discharged, your primary care physician will handle any further medical issues. Please note that NO REFILLS for any discharge medications will be authorized once you are discharged, as it is imperative that you return to your primary care physician (or establish a relationship with a primary care physician if you do not have one) for your post hospital discharge needs so that they can reassess your need for medications and monitor your lab values.   Increase activity slowly   Complete by: As directed       Allergies as of 01/31/2022       Reactions   Lisinopril    Other reaction(s): Cough   Metformin Diarrhea        Medication List     TAKE these medications    acetaminophen 500 MG  tablet Commonly known as: TYLENOL Take 500 mg by mouth every 6 (six) hours as needed for moderate pain.   Alogliptin Benzoate 12.5 MG Tabs Take 12.5 mg by mouth daily.   amLODipine 10 MG tablet Commonly known as: NORVASC Take 10 mg by mouth every evening.   aspirin 81 MG tablet Take 81 mg by mouth daily.   atorvastatin 80 MG tablet Commonly known as: LIPITOR Take 40 mg by mouth every evening.   clopidogrel 75 MG tablet Commonly known as: PLAVIX Take 75 mg by mouth daily.   diclofenac sodium 1 % Gel Commonly known as: VOLTAREN Apply 2 g topically 4 (four) times daily as needed (pain).   losartan 100 MG tablet Commonly known as: COZAAR Take 100 mg by mouth daily.   memantine 10 MG tablet Commonly known as: Namenda Take 1 tablet (10 mg total) by mouth 2 (two) times daily. Start only after first finishing starter pack   metoprolol succinate 50 MG 24 hr tablet Commonly known as: TOPROL-XL Take 50 mg  by mouth at bedtime. Take with or immediately following a meal.   multivitamin tablet Take 1 tablet by mouth daily.   nitroGLYCERIN 0.4 MG SL tablet Commonly known as: NITROSTAT Place 1 tablet (0.4 mg total) under the tongue every 5 (five) minutes x 3 doses as needed for chest pain.   psyllium 95 % Pack Commonly known as: HYDROCIL/METAMUCIL Take 1 packet by mouth daily.        Follow-up Information     Doreene Nest, NP. Go on 02/10/2022.   Specialty: Internal Medicine Why: :20pm Contact information: [address] [place] [address] [phone]         Rinaldo Cloud, MD. Schedule an appointment as soon as possible for a visit in 1 week(s).   Specialty: Cardiology Contact information: [address] [place] [address] [phone]                Allergies  Allergen Reactions   Lisinopril     Other reaction(s): Cough   Metformin Diarrhea     Other Procedures/Studies: ECHOCARDIOGRAM COMPLETE  Result Date:  01/30/2022    ECHOCARDIOGRAM REPORT   Patient Name:   BERTRAM HADDIX Date of Exam: 01/30/2022 Medical Rec #:  [id number]       Height:       64.0 in Accession #:    [id number]      Weight:       160.0 lb Date of Birth:  1934-04-20        BSA:          1.779 m Patient Age:    88 years        BP:           148/71 mmHg Patient Gender: M               HR:           61 bpm. Exam Location:  Inpatient Procedure: 2D Echo, Cardiac Doppler and Color Doppler Indications:    Elevated troponin  History:        Patient has no prior history of Echocardiogram examinations.                 CAD; Risk Factors:Hypertension and Diabetes.  Sonographer:    Cleatis Polka Referring Phys: [phone] ERIC CHEN IMPRESSIONS  1. Left ventricular ejection fraction, by estimation, is 50 to 55%. The left ventricle has low normal function. The left ventricle has no regional wall motion abnormalities. There is severe asymmetric left ventricular hypertrophy of the basal-septal segment. Left ventricular diastolic parameters are consistent with Grade I diastolic dysfunction (impaired relaxation).  2. Right ventricular systolic function is normal. The right ventricular size is normal. Tricuspid regurgitation signal is inadequate for assessing PA pressure.  3. The mitral valve is normal in structure. No evidence of mitral valve regurgitation. No evidence of mitral stenosis.  4. The aortic valve is tricuspid. There is mild calcification of the aortic valve. There is mild thickening of the aortic valve. Aortic valve regurgitation is trivial. Aortic valve sclerosis is present, with no evidence of aortic valve stenosis. Comparison(s): No prior Echocardiogram. FINDINGS  Left Ventricle: Left ventricular ejection fraction, by estimation, is 50 to 55%. The left ventricle has low normal function. The left ventricle has no regional wall motion abnormalities. The left ventricular internal cavity size was normal in size. There is severe asymmetric left ventricular hypertrophy  of the basal-septal segment. Left ventricular diastolic parameters are consistent with Grade  I diastolic dysfunction (impaired relaxation). Right Ventricle: The right ventricular size is normal. No increase in right ventricular wall thickness. Right ventricular systolic function is normal. Tricuspid regurgitation signal is inadequate for assessing PA pressure. Left Atrium: Left atrial size was normal in size. Right Atrium: Right atrial size was normal in size. Pericardium: There is no evidence of pericardial effusion. Mitral Valve: The mitral valve is normal in structure. No evidence of mitral valve regurgitation. No evidence of mitral valve stenosis. Tricuspid Valve: The tricuspid valve is normal in structure. Tricuspid valve regurgitation is not demonstrated. No evidence of tricuspid stenosis. Aortic Valve: The aortic valve is tricuspid. There is mild calcification of the aortic valve. There is mild thickening of the aortic valve. There is mild aortic valve annular calcification. Aortic valve regurgitation is trivial. Aortic valve sclerosis is  present, with no evidence of aortic valve stenosis. Aortic valve peak gradient measures 8.5 mmHg. Pulmonic Valve: The pulmonic valve was normal in structure. Pulmonic valve regurgitation is not visualized. No evidence of pulmonic stenosis. Aorta: The aortic root and ascending aorta are structurally normal, with no evidence of dilitation. IAS/Shunts: No atrial level shunt detected by color flow Doppler.  LEFT VENTRICLE PLAX 2D LVIDd:         4.70 cm      Diastology LVIDs:         3.40 cm      LV e' medial:    3.48 cm/s LV PW:         1.40 cm      LV E/e' medial:  15.3 LV IVS:        1.50 cm      LV e' lateral:   3.70 cm/s LVOT diam:     2.20 cm      LV E/e' lateral: 14.4 LV SV:         63 LV SV Index:   36 LVOT Area:     3.80 cm  LV Volumes (MOD) LV vol d, MOD A2C: 112.0 ml LV vol d, MOD A4C: 101.0 ml LV vol s, MOD A2C: 52.1 ml LV vol s, MOD A4C: 47.1 ml LV SV MOD A2C:      59.9 ml LV SV MOD A4C:     101.0 ml LV SV MOD BP:      57.2 ml RIGHT VENTRICLE RV Basal diam:  2.90 cm RV S prime:     15.10 cm/s TAPSE (M-mode): 1.8 cm LEFT ATRIUM             Index        RIGHT ATRIUM          Index LA diam:        4.00 cm 2.25 cm/m   RA Area:     9.20 cm LA Vol (A2C):   46.7 ml 26.25 ml/m  RA Volume:   18.10 ml 10.17 ml/m LA Vol (A4C):   49.2 ml 27.65 ml/m LA Biplane Vol: 51.1 ml 28.72 ml/m  AORTIC VALVE AV Area (Vmax): 1.73 cm AV Vmax:        146.00 cm/s AV Peak Grad:   8.5 mmHg LVOT Vmax:      66.60 cm/s LVOT Vmean:     49.600 cm/s LVOT VTI:       0.167 m  AORTA Ao Root diam: 3.40 cm Ao Asc diam:  3.20 cm MITRAL VALVE MV Area (PHT): 2.99 cm    SHUNTS MV Decel Time: 254 msec    Systemic VTI:  0.17  m MV E velocity: 53.40 cm/s  Systemic Diam: 2.20 cm MV A velocity: 91.70 cm/s MV E/A ratio:  0.58 Riley Lam MD Electronically signed by Riley Lam MD Signature Date/Time: 01/30/2022/1:36:47 PM    Final    DG Chest Port 1 View  Result Date: 01/29/2022 CLINICAL DATA:  Tachycardia EXAM: PORTABLE CHEST 1 VIEW COMPARISON:  11/30/2021 FINDINGS: Lungs are clear.  No pleural effusion or pneumothorax. The heart is normal in size. IMPRESSION: No evidence of acute cardiopulmonary disease. Electronically Signed   By: Charline Bills M.D.   On: 01/29/2022 23:50     TODAY-DAY OF DISCHARGE:  Subjective:   Alexander Donaldson today has no headache,no chest abdominal pain,no new weakness tingling or numbness, feels much better wants to go home today.   Objective:   Blood pressure (!) 153/70, pulse 62, temperature 98.6 F (37 C), temperature source Oral, resp. rate 18, height  (1.626 m), weight 62.7 kg, SpO2 100 %.  Intake/Output Summary (Last 24 hours) at 01/31/2022 1205 Last data filed at 01/31/2022 0800 Gross per 24 hour  Intake 480 ml  Output 430 ml  Net 50 ml   Filed Weights   01/29/22 2324 01/30/22 1525 01/31/22 0500  Weight: 72.6 kg 62.8 kg 62.7 kg     Exam: Awake Alert, Oriented *3, No new F.N deficits, Normal affect [place].AT,PERRAL Supple Neck,No JVD, No cervical lymphadenopathy appriciated.  Symmetrical Chest wall movement, Good air movement bilaterally, CTAB RRR,No Gallops,Rubs or new Murmurs, No Parasternal Heave +ve B.Sounds, Abd Soft, Non tender, No organomegaly appriciated, No rebound -guarding or rigidity. No Cyanosis, Clubbing or edema, No new Rash or bruise   PERTINENT RADIOLOGIC STUDIES: ECHOCARDIOGRAM COMPLETE  Result Date: 01/30/2022    ECHOCARDIOGRAM REPORT   Patient Name:   Alexander Donaldson Date of Exam: 01/30/2022 Medical Rec #:  [id number]       Height:       64.0 in Accession #:    [id number]      Weight:       160.0 lb Date of Birth:  1934-06-15        BSA:          1.779 m Patient Age:    88 years        BP:           148/71 mmHg Patient Gender: M               HR:           61 bpm. Exam Location:  Inpatient Procedure: 2D Echo, Cardiac Doppler and Color Doppler Indications:    Elevated troponin  History:        Patient has no prior history of Echocardiogram examinations.                 CAD; Risk Factors:Hypertension and Diabetes.  Sonographer:    Cleatis Polka Referring Phys: [phone] ERIC CHEN IMPRESSIONS  1. Left ventricular ejection fraction, by estimation, is 50 to 55%. The left ventricle has low normal function. The left ventricle has no regional wall motion abnormalities. There is severe asymmetric left ventricular hypertrophy of the basal-septal segment. Left ventricular diastolic parameters are consistent with Grade I diastolic dysfunction (impaired relaxation).  2. Right ventricular systolic function is normal. The right ventricular size is normal. Tricuspid regurgitation signal is inadequate for assessing PA pressure.  3. The mitral valve is normal in structure. No evidence of mitral valve regurgitation. No evidence of mitral stenosis.  4. The aortic valve  is tricuspid. There is mild calcification of the aortic valve. There  is mild thickening of the aortic valve. Aortic valve regurgitation is trivial. Aortic valve sclerosis is present, with no evidence of aortic valve stenosis. Comparison(s): No prior Echocardiogram. FINDINGS  Left Ventricle: Left ventricular ejection fraction, by estimation, is 50 to 55%. The left ventricle has low normal function. The left ventricle has no regional wall motion abnormalities. The left ventricular internal cavity size was normal in size. There is severe asymmetric left ventricular hypertrophy of the basal-septal segment. Left ventricular diastolic parameters are consistent with Grade I diastolic dysfunction (impaired relaxation). Right Ventricle: The right ventricular size is normal. No increase in right ventricular wall thickness. Right ventricular systolic function is normal. Tricuspid regurgitation signal is inadequate for assessing PA pressure. Left Atrium: Left atrial size was normal in size. Right Atrium: Right atrial size was normal in size. Pericardium: There is no evidence of pericardial effusion. Mitral Valve: The mitral valve is normal in structure. No evidence of mitral valve regurgitation. No evidence of mitral valve stenosis. Tricuspid Valve: The tricuspid valve is normal in structure. Tricuspid valve regurgitation is not demonstrated. No evidence of tricuspid stenosis. Aortic Valve: The aortic valve is tricuspid. There is mild calcification of the aortic valve. There is mild thickening of the aortic valve. There is mild aortic valve annular calcification. Aortic valve regurgitation is trivial. Aortic valve sclerosis is  present, with no evidence of aortic valve stenosis. Aortic valve peak gradient measures 8.5 mmHg. Pulmonic Valve: The pulmonic valve was normal in structure. Pulmonic valve regurgitation is not visualized. No evidence of pulmonic stenosis. Aorta: The aortic root and ascending aorta are structurally normal, with no evidence of dilitation. IAS/Shunts: No atrial level shunt  detected by color flow Doppler.  LEFT VENTRICLE PLAX 2D LVIDd:         4.70 cm      Diastology LVIDs:         3.40 cm      LV e' medial:    3.48 cm/s LV PW:         1.40 cm      LV E/e' medial:  15.3 LV IVS:        1.50 cm      LV e' lateral:   3.70 cm/s LVOT diam:     2.20 cm      LV E/e' lateral: 14.4 LV SV:         63 LV SV Index:   36 LVOT Area:     3.80 cm  LV Volumes (MOD) LV vol d, MOD A2C: 112.0 ml LV vol d, MOD A4C: 101.0 ml LV vol s, MOD A2C: 52.1 ml LV vol s, MOD A4C: 47.1 ml LV SV MOD A2C:     59.9 ml LV SV MOD A4C:     101.0 ml LV SV MOD BP:      57.2 ml RIGHT VENTRICLE RV Basal diam:  2.90 cm RV S prime:     15.10 cm/s TAPSE (M-mode): 1.8 cm LEFT ATRIUM             Index        RIGHT ATRIUM          Index LA diam:        4.00 cm 2.25 cm/m   RA Area:     9.20 cm LA Vol (A2C):   46.7 ml 26.25 ml/m  RA Volume:   18.10 ml 10.17 ml/m LA Vol (A4C):   49.2  ml 27.65 ml/m LA Biplane Vol: 51.1 ml 28.72 ml/m  AORTIC VALVE AV Area (Vmax): 1.73 cm AV Vmax:        146.00 cm/s AV Peak Grad:   8.5 mmHg LVOT Vmax:      66.60 cm/s LVOT Vmean:     49.600 cm/s LVOT VTI:       0.167 m  AORTA Ao Root diam: 3.40 cm Ao Asc diam:  3.20 cm MITRAL VALVE MV Area (PHT): 2.99 cm    SHUNTS MV Decel Time: 254 msec    Systemic VTI:  0.17 m MV E velocity: 53.40 cm/s  Systemic Diam: 2.20 cm MV A velocity: 91.70 cm/s MV E/A ratio:  0.58 Riley Lam MD Electronically signed by Riley Lam MD Signature Date/Time: 01/30/2022/1:36:47 PM    Final    DG Chest Port 1 View  Result Date: 01/29/2022 CLINICAL DATA:  Tachycardia EXAM: PORTABLE CHEST 1 VIEW COMPARISON:  11/30/2021 FINDINGS: Lungs are clear.  No pleural effusion or pneumothorax. The heart is normal in size. IMPRESSION: No evidence of acute cardiopulmonary disease. Electronically Signed   By: Charline Bills M.D.   On: 01/29/2022 23:50     PERTINENT LAB RESULTS: CBC: Recent Labs    01/29/22 2358  WBC 7.0  HGB 14.8  HCT 42.1  PLT 170    CMET CMP     Component Value Date/Time   NA 135 01/31/2022 0630   NA 139 12/30/2020 0000   K 3.9 01/31/2022 0630   CL 102 01/31/2022 0630   CO2 26 01/31/2022 0630   GLUCOSE 126 (H) 01/31/2022 0630   BUN 19 01/31/2022 0630   BUN 19 12/30/2020 0000   CREATININE 1.06 01/31/2022 0630   CALCIUM 8.4 (L) 01/31/2022 0630   PROT 6.8 01/30/2022 0910   ALBUMIN 3.8 01/30/2022 0910   AST 23 01/30/2022 0910   ALT 14 01/30/2022 0910   ALKPHOS 60 01/30/2022 0910   BILITOT 1.2 01/30/2022 0910   GFRNONAA >60 01/31/2022 0630   GFRAA >60 02/15/2018 0353    GFR Estimated Creatinine Clearance: 41.9 mL/min (by C-G formula based on SCr of 1.06 mg/dL). No results for input(s): "LIPASE", "AMYLASE" in the last 72 hours. No results for input(s): "CKTOTAL", "CKMB", "CKMBINDEX", "TROPONINI" in the last 72 hours. Invalid input(s): "POCBNP" No results for input(s): "DDIMER" in the last 72 hours. Recent Labs    01/30/22 0304  HGBA1C 7.2*   No results for input(s): "CHOL", "HDL", "LDLCALC", "TRIG", "CHOLHDL", "LDLDIRECT" in the last 72 hours. Recent Labs    01/30/22 0304  TSH 1.946   No results for input(s): "VITAMINB12", "FOLATE", "FERRITIN", "TIBC", "IRON", "RETICCTPCT" in the last 72 hours. Coags: No results for input(s): "INR" in the last 72 hours.  Invalid input(s): "PT" Microbiology: No results found for this or any previous visit (from the past 240 hour(s)).  FURTHER DISCHARGE INSTRUCTIONS:  Get Medicines reviewed and adjusted: Please take all your medications with you for your next visit with your Primary MD  Laboratory/radiological data: Please request your Primary MD to go over all hospital tests and procedure/radiological results at the follow up, please ask your Primary MD to get all Hospital records sent to his/her office.  In some cases, they will be blood work, cultures and biopsy results pending at the time of your discharge. Please request that your primary care M.D. goes  through all the records of your hospital data and follows up on these results.  Also Note the following: If you experience worsening of your  admission symptoms, develop shortness of breath, life threatening emergency, suicidal or homicidal thoughts you must seek medical attention immediately by calling 911 or calling your MD immediately  if symptoms less severe.  You must read complete instructions/literature along with all the possible adverse reactions/side effects for all the Medicines you take and that have been prescribed to you. Take any new Medicines after you have completely understood and accpet all the possible adverse reactions/side effects.   Do not drive when taking Pain medications or sleeping medications (Benzodaizepines)  Do not take more than prescribed Pain, Sleep and Anxiety Medications. It is not advisable to combine anxiety,sleep and pain medications without talking with your primary care practitioner  Special Instructions: If you have smoked or chewed Tobacco  in the last 2 yrs please stop smoking, stop any regular Alcohol  and or any Recreational drug use.  Wear Seat belts while driving.  Please note: You were cared for by a hospitalist during your hospital stay. Once you are discharged, your primary care physician will handle any further medical issues. Please note that NO REFILLS for any discharge medications will be authorized once you are discharged, as it is imperative that you return to your primary care physician (or establish a relationship with a primary care physician if you do not have one) for your post hospital discharge needs so that they can reassess your need for medications and monitor your lab values.  Total Time spent coordinating discharge including counseling, education and face to face time equals greater than 30 minutes.  SignedJeoffrey Massed 01/31/2022 12:05 PM

## 2022-01-31 NOTE — TOC Transition Note (Signed)
Transition of Care [facility] - CM/SW Discharge Note   Patient Details  Name: Alexander Donaldson MRN: [id number] Date of Birth: 11-20-1933  Transition of Care [facility] CM/SW Contact:  [name], RN Phone Number: 01/31/2022, 12:17 PM   Clinical Narrative:    Patient is from home with wife, sob, tachy, trops elevated, has dementia, echo penidng, cards to see.  Wife takes care of him at home, she states he is a [facility] [place] patient, PCP is Jarrett Soho, CSW is [place]. [facility] that comes out 4 days a week from 10am to 3 pm.  Wife states they go out shopping sometimes and he ambulates pretty good, sometimes uses the cart to steady himself.  She states they have no needs right now,  wife states she is calling the [facility] now to notify them of this admit.   The [facility] ID number is [id number]         Patient Goals and CMS Choice        Discharge Placement                       Discharge Plan and Services                                     Social Determinants of Health (SDOH) Interventions     Readmission Risk Interventions     No data to display

## 2022-01-31 NOTE — Evaluation (Signed)
Physical Therapy Evaluation Patient Details Name: Alexander Donaldson MRN: [id number] DOB: [birth year] Today's Date: 01/31/2022  History of Present Illness  88 y.o. M admitted on 01/29/22 due to hypotension, generalized weakness, and tachycardia. Cardiac workup indicates ACS/NSTEMI. PMH significant for coronary disease status post stents, type 2 diabetes, dementia, hypertension.  Clinical Impression   Pt presents with generalized weakness and mild unsteadiness, otherwise pt with [facility] and at baseline. Pt to benefit from acute PT to address deficits. Pt ambulated hallway distance with intermittent use of RW for steadiness, at baseline pt uses cane. Per wife, pt is close to baseline and wife is able to assist pt at home once d/c. No follow up PT indicated at this time. PT to progress mobility as tolerated, and will continue to follow acutely.         Recommendations for follow up therapy are one component of a multi-disciplinary discharge planning process, led by the attending physician.  Recommendations may be updated based on patient status, additional functional criteria and insurance authorization.  Follow Up Recommendations No PT follow up      Assistance Recommended at Discharge Frequent or constant Supervision/Assistance (from wife)  Patient can return home with the following  Assistance with cooking/housework;Direct supervision/assist for medications management;A little help with bathing/dressing/bathroom    Equipment Recommendations None recommended by PT  Recommendations for Other Services       Functional Status Assessment Patient has had a recent decline in their functional status and demonstrates the ability to make significant improvements in function in a reasonable and predictable amount of time.     Precautions / Restrictions Precautions Precautions: Fall Restrictions Weight Bearing Restrictions: No      Mobility  Bed Mobility Overal bed mobility: Modified Independent              General bed mobility comments: Increased time, HOB elevated.    Transfers Overall transfer level: Modified independent                 General transfer comment: increased time to rise    Ambulation/Gait Ambulation/Gait assistance: Supervision Gait Distance (Feet): 300 Feet Assistive device: Rolling walker (2 wheels), 1 person hand held assist, None Gait Pattern/deviations: Step-through pattern, Decreased stride length, Drifts right/left Gait velocity: decr     General Gait Details: initially pt requiring RW to self-steady, progressing to HHA then no AD. Supervision for safety, cues for hallway navigation and staying on task  Stairs            Wheelchair Mobility    Modified Rankin (Stroke Patients Only)       Balance Overall balance assessment: Mild deficits observed, not formally tested                                           Pertinent Vitals/Pain Pain Assessment Pain Assessment: No/denies pain    Home Living Family/patient expects to be discharged to:: Private residence Living Arrangements: Spouse/significant other Available Help at Discharge: Family;Available 24 hours/day Type of Home: House Home Access: Ramped entrance       Home Layout: One level Home Equipment: Grab bars - toilet;Grab bars - tub/shower;Shower seat;Cane - single Librarian, academic (2 wheels)      Prior Function Prior Level of Function : Independent/Modified Independent             Mobility Comments: uses cane in community, per pt's  wife is independent with ambulation in home ADLs Comments: supervision only with ADL's except bathing (wife supervises and assists with washing back), wife assists with verbal cuing and all IADL's.     Hand Dominance   Dominant Hand: Right    Extremity/Trunk Assessment   Upper Extremity Assessment Upper Extremity Assessment: Defer to OT evaluation    Lower Extremity Assessment Lower Extremity  Assessment: Generalized weakness    Cervical / Trunk Assessment Cervical / Trunk Assessment: Normal  Communication   Communication: No difficulties  Cognition Arousal/Alertness: Awake/alert Behavior During Therapy: WFL for tasks assessed/performed Overall Cognitive Status: History of cognitive impairments - at baseline                                 General Comments: Pt has history of dementia, oriented to self but not location or time which is pt baseline.        General Comments General comments (skin integrity, edema, etc.): VSS, HR <100 throughout session. Pt with no reports of chest discomfort or pain during mobility    Exercises     Assessment/Plan    PT Assessment Patient needs continued PT services  PT Problem List Decreased strength;Decreased mobility;Decreased safety awareness;Decreased balance;Decreased activity tolerance;Decreased knowledge of use of DME;Decreased cognition       PT Treatment Interventions DME instruction;Therapeutic activities;Gait training;Therapeutic exercise;Patient/family education;Balance training;Functional mobility training;Stair training;Neuromuscular re-education    PT Goals (Current goals can be found in the Care Plan section)  Acute Rehab PT Goals Patient Stated Goal: home PT Goal Formulation: With patient/family Time For Goal Achievement: 02/14/22 Potential to Achieve Goals: Good    Frequency Min 3X/week     Co-evaluation               AM-PAC PT "6 Clicks" Mobility  Outcome Measure Help needed turning from your back to your side while in a flat bed without using bedrails?: A Little Help needed moving from lying on your back to sitting on the side of a flat bed without using bedrails?: A Little Help needed moving to and from a bed to a chair (including a wheelchair)?: A Little Help needed standing up from a chair using your arms (e.g., wheelchair or bedside chair)?: A Little Help needed to walk in hospital  room?: A Little Help needed climbing 3-5 steps with a railing? : A Little 6 Click Score: 18    End of Session   Activity Tolerance: Patient tolerated treatment well Patient left: in bed;with call bell/phone within reach;with bed alarm set;with family/visitor present Nurse Communication: Mobility status PT Visit Diagnosis: Unsteadiness on feet (R26.81);Muscle weakness (generalized) (M62.81)    Time: 0539-7673 PT Time Calculation (min) (ACUTE ONLY): 13 min   Charges:   PT Evaluation $PT Eval Low Complexity: 1 Low         Sola Margolis S, PT DPT Acute Rehabilitation Services Pager [phone]  Office [phone]   Tyrone Apple E Christain [place] 01/31/2022, 9:51 AM

## 2022-01-31 NOTE — Progress Notes (Signed)
Subjective:  Patient denies any chest pain or shortness of breath denies further palpitations.  Remains in sinus rhythm.  Discussed with patient's wife at length regarding mildly elevated high-sensitivity troponin I and various options of treatment, wanted medical management only.  Objective:  Vital Signs in the last 24 hours: Temp:  [97.7 F (36.5 C)-98.6 F (37 C)] 98.6 F (37 C) (07/24 0433) Pulse Rate:  [60-99] 62 (07/24 1130) Resp:  [16-18] 18 (07/24 0825) BP: (119-163)/(70-101) 153/70 (07/24 1130) SpO2:  [95 %-100 %] 100 % (07/24 1130) Weight:  [62.7 kg-62.8 kg] 62.7 kg (07/24 0500)  Intake/Output from previous day: 07/23 0701 - 07/24 0700 In: 240 [P.O.:240] Out: 430 [Urine:430] Intake/Output from this shift: Total I/O In: 240 [P.O.:240] Out: -   Physical Exam: Neck: no adenopathy, no carotid bruit, no JVD, and supple, symmetrical, trachea midline Lungs: clear to auscultation bilaterally Heart: regular rate and rhythm, S1, S2 normal, and soft systolic murmur noted no S3 gallop Abdomen: soft, non-tender; bowel sounds normal; no masses,  no organomegaly Extremities: extremities normal, atraumatic, no cyanosis or edema  Lab Results: Recent Labs    01/29/22 2358  WBC 7.0  HGB 14.8  PLT 170   Recent Labs    01/30/22 0910 01/31/22 0630  NA 137 135  K 3.9 3.9  CL 102 102  CO2 27 26  GLUCOSE 203* 126*  BUN 18 19  CREATININE 0.98 1.06   No results for input(s): "TROPONINI" in the last 72 hours.  Invalid input(s): "CK", "MB" Hepatic Function Panel Recent Labs    01/30/22 0910  PROT 6.8  ALBUMIN 3.8  AST 23  ALT 14  ALKPHOS 60  BILITOT 1.2   No results for input(s): "CHOL" in the last 72 hours. No results for input(s): "PROTIME" in the last 72 hours.  Imaging: Imaging results have been reviewed and ECHOCARDIOGRAM COMPLETE  Result Date: 01/30/2022    ECHOCARDIOGRAM REPORT   Patient Name:   Alexander Donaldson Date of Exam: 01/30/2022 Medical Rec #:   [id number]       Height:       64.0 in Accession #:    [id number]      Weight:       160.0 lb Date of Birth:  04/20/1934        BSA:          1.779 m Patient Age:    88 years        BP:           148/71 mmHg Patient Gender: M               HR:           61 bpm. Exam Location:  Inpatient Procedure: 2D Echo, Cardiac Doppler and Color Doppler Indications:    Elevated troponin  History:        Patient has no prior history of Echocardiogram examinations.                 CAD; Risk Factors:Hypertension and Diabetes.  Sonographer:    Jyl Heinz Referring Phys: [place]  1. Left ventricular ejection fraction, by estimation, is 50 to 55%. The left ventricle has low normal function. The left ventricle has no regional wall motion abnormalities. There is severe asymmetric left ventricular hypertrophy of the basal-septal segment. Left ventricular diastolic parameters are consistent with Grade I diastolic dysfunction (impaired relaxation).  2. Right ventricular systolic function is normal. The right ventricular size is normal.  Tricuspid regurgitation signal is inadequate for assessing PA pressure.  3. The mitral valve is normal in structure. No evidence of mitral valve regurgitation. No evidence of mitral stenosis.  4. The aortic valve is tricuspid. There is mild calcification of the aortic valve. There is mild thickening of the aortic valve. Aortic valve regurgitation is trivial. Aortic valve sclerosis is present, with no evidence of aortic valve stenosis. Comparison(s): No prior Echocardiogram. FINDINGS  Left Ventricle: Left ventricular ejection fraction, by estimation, is 50 to 55%. The left ventricle has low normal function. The left ventricle has no regional wall motion abnormalities. The left ventricular internal cavity size was normal in size. There is severe asymmetric left ventricular hypertrophy of the basal-septal segment. Left ventricular diastolic parameters are consistent with Grade I diastolic  dysfunction (impaired relaxation). Right Ventricle: The right ventricular size is normal. No increase in right ventricular wall thickness. Right ventricular systolic function is normal. Tricuspid regurgitation signal is inadequate for assessing PA pressure. Left Atrium: Left atrial size was normal in size. Right Atrium: Right atrial size was normal in size. Pericardium: There is no evidence of pericardial effusion. Mitral Valve: The mitral valve is normal in structure. No evidence of mitral valve regurgitation. No evidence of mitral valve stenosis. Tricuspid Valve: The tricuspid valve is normal in structure. Tricuspid valve regurgitation is not demonstrated. No evidence of tricuspid stenosis. Aortic Valve: The aortic valve is tricuspid. There is mild calcification of the aortic valve. There is mild thickening of the aortic valve. There is mild aortic valve annular calcification. Aortic valve regurgitation is trivial. Aortic valve sclerosis is  present, with no evidence of aortic valve stenosis. Aortic valve peak gradient measures 8.5 mmHg. Pulmonic Valve: The pulmonic valve was normal in structure. Pulmonic valve regurgitation is not visualized. No evidence of pulmonic stenosis. Aorta: The aortic root and ascending aorta are structurally normal, with no evidence of dilitation. IAS/Shunts: No atrial level shunt detected by color flow Doppler.  LEFT VENTRICLE PLAX 2D LVIDd:         4.70 cm      Diastology LVIDs:         3.40 cm      LV e' medial:    3.48 cm/s LV PW:         1.40 cm      LV E/e' medial:  15.3 LV IVS:        1.50 cm      LV e' lateral:   3.70 cm/s LVOT diam:     2.20 cm      LV E/e' lateral: 14.4 LV SV:         63 LV SV Index:   36 LVOT Area:     3.80 cm  LV Volumes (MOD) LV vol d, MOD A2C: 112.0 ml LV vol d, MOD A4C: 101.0 ml LV vol s, MOD A2C: 52.1 ml LV vol s, MOD A4C: 47.1 ml LV SV MOD A2C:     59.9 ml LV SV MOD A4C:     101.0 ml LV SV MOD BP:      57.2 ml RIGHT VENTRICLE RV Basal diam:  2.90 cm RV  S prime:     15.10 cm/s TAPSE (M-mode): 1.8 cm LEFT ATRIUM             Index        RIGHT ATRIUM          Index LA diam:        4.00 cm 2.25 cm/m  RA Area:     9.20 cm LA Vol (A2C):   46.7 ml 26.25 ml/m  RA Volume:   18.10 ml 10.17 ml/m LA Vol (A4C):   49.2 ml 27.65 ml/m LA Biplane Vol: 51.1 ml 28.72 ml/m  AORTIC VALVE AV Area (Vmax): 1.73 cm AV Vmax:        146.00 cm/s AV Peak Grad:   8.5 mmHg LVOT Vmax:      66.60 cm/s LVOT Vmean:     49.600 cm/s LVOT VTI:       0.167 m  AORTA Ao Root diam: 3.40 cm Ao Asc diam:  3.20 cm MITRAL VALVE MV Area (PHT): 2.99 cm    SHUNTS MV Decel Time: 254 msec    Systemic VTI:  0.17 m MV E velocity: 53.40 cm/s  Systemic Diam: 2.20 cm MV A velocity: 91.70 cm/s MV E/A ratio:  0.58 Riley Lam MD Electronically signed by Riley Lam MD Signature Date/Time: 01/30/2022/1:36:47 PM    Final    DG Chest Port 1 View  Result Date: 01/29/2022 CLINICAL DATA:  Tachycardia EXAM: PORTABLE CHEST 1 VIEW COMPARISON:  11/30/2021 FINDINGS: Lungs are clear.  No pleural effusion or pneumothorax. The heart is normal in size. IMPRESSION: No evidence of acute cardiopulmonary disease. Electronically Signed   By: Charline Bills M.D.   On: 01/29/2022 23:50    Cardiac Studies:  Assessment/Plan:  Status post small non-Q wave myocardial infarction Status post hypotensive shock secondary to probably medications Multivessel CAD status post PCI to LAD left circumflex and RCA in the past Hypertension Hyperlipidemia Diabetes mellitus Probably mild dementia Plan Continue present management Okay to discharge from cardiac point of view on aspirin, Plavix, beta-blockers, statins, and amlodipine 2.5 mg daily as blood pressure tolerates Follow-up with me in 1 week   LOS: 0 days    Rinaldo Cloud 01/31/2022, 12:04 PM

## 2022-01-31 NOTE — TOC Progression Note (Addendum)
Transition of Care [facility]) - Progression Note    Patient Details  Name: Alexander Donaldson MRN: [id number] Date of Birth: Mar 09, 1934  [facility] CM/SW Contact  [name], RN Phone Number: 01/31/2022, 11:23 AM  Clinical Narrative:    Patient is from home with wife, sob, tachy, trops elevated, has dementia, echo penidng, cards to see.  Wife takes care of him at home, she states he is a [facility] [place] patient, PCP is Jarrett Soho, CSW is [place]. Patient has a [facility] aide that comes out 4 days a week from 10am to 3 pm.  Wife states they go out shopping sometimes and he ambulates pretty good, sometimes uses the cart to steady himself.  She states they have no needs right now,  wife states she is calling the [facility] now to notify them of this admit.  TOC will continue to follow . The [place] ID number is [phone].       Expected Discharge Plan and Services                                                 Social Determinants of Health (SDOH) Interventions    Readmission Risk Interventions     No data to display

## 2022-02-10 ENCOUNTER — Ambulatory Visit (INDEPENDENT_AMBULATORY_CARE_PROVIDER_SITE_OTHER): Payer: Medicare Other | Admitting: Primary Care

## 2022-02-10 DIAGNOSIS — I2510 Atherosclerotic heart disease of native coronary artery without angina pectoris: Secondary | ICD-10-CM

## 2022-02-10 DIAGNOSIS — I10 Essential (primary) hypertension: Secondary | ICD-10-CM | POA: Diagnosis not present

## 2022-02-10 DIAGNOSIS — E1159 Type 2 diabetes mellitus with other circulatory complications: Secondary | ICD-10-CM | POA: Diagnosis not present

## 2022-02-10 DIAGNOSIS — I214 Non-ST elevation (NSTEMI) myocardial infarction: Secondary | ICD-10-CM | POA: Diagnosis not present

## 2022-02-10 DIAGNOSIS — G309 Alzheimer's disease, unspecified: Secondary | ICD-10-CM | POA: Diagnosis not present

## 2022-02-10 DIAGNOSIS — F0280 Dementia in other diseases classified elsewhere without behavioral disturbance: Secondary | ICD-10-CM

## 2022-02-10 NOTE — Progress Notes (Signed)
Subjective:    Patient ID: Alexander Donaldson, male    DOB: June 10, 1934, 88 y.o.   MRN: [id number]  HPI  Alexander Donaldson is a very pleasant 88 y.o. male with a history of CAD, type 2 diabetes, hypertension, dementia with mood disorder who presents today for hospital follow up. His wife joins [place] today who provides information for HPI.  He presented to [facility] on 01/29/22 per EMS for hypotension, tachycardia and generalized weakness. ED work up revealed elevated troponin levels which were trending up. Diagnosed with NSTEMI and was admitted for further evaluation.   Cardiology consulted who recommended to continue home medications and echocardiogram which revealed LVEF of 50-55%, grade 1 DD, severe asymmetric left basal hypertrophy. No aggressive therapy/treatment completed. He was discharged home on 01/31/22 with recommendations for PCP follow up. His home medications were continued, no changes.   Since his discharge home he's doing well per his wife. He has not complained about anything. She has not noticed diaphoresis. He has an appointment scheduled with his cardiologist next week. He is compliant to all of his medications as prescribed. He closely follows with the [facility]. His dementia has progressed per his wife, she has to care for him full time, cannot leave him unsupervised.   Review of Systems  Constitutional:  Negative for diaphoresis.  Respiratory:  Negative for shortness of breath.   Cardiovascular:  Negative for chest pain.  Gastrointestinal:  Negative for vomiting.         Past Medical History:  Diagnosis Date   Acute encephalopathy 02/12/2018   CAP (community acquired pneumonia) 02/11/2018   Carpal tunnel syndrome, bilateral 02/25/2020   Coronary artery disease    Diabetes mellitus    Diverticulitis    Hypertension    Hypokalemia 02/12/2018    Social History   Socioeconomic History   Marital status: Married    Spouse name: Clydie Braun   Number of children: Not on file   Years  of education: Not on file   Highest education level: Not on file  Occupational History   Not on file  Tobacco Use   Smoking status: Never   Smokeless tobacco: Never  Vaping Use   Vaping Use: Never used  Substance and Sexual Activity   Alcohol use: No    Comment: social   Drug use: No   Sexual activity: Yes    Birth control/protection: None  Other Topics Concern   Not on file  Social History Narrative   Lives with wife   Right Handed   Drinks 1-2 cups caffeine daily   Social Determinants of Health   Financial Resource Strain: Low Risk  (12/30/2021)   Overall Financial Resource Strain (CARDIA)    Difficulty of Paying Living Expenses: Not hard at all  Food Insecurity: No Food Insecurity (12/30/2021)   Hunger Vital Sign    Worried About Running Out of Food in the Last Year: Never true    Ran Out of Food in the Last Year: Never true  Transportation Needs: No Transportation Needs (12/30/2021)   PRAPARE - Transportation    Lack of Transportation (Medical): No    Lack of Transportation (Non-Medical): No  Physical Activity: Inactive (12/30/2021)   Exercise Vital Sign    Days of Exercise per Week: 0 days    Minutes of Exercise per Session: 0 min  Stress: No Stress Concern Present (12/30/2021)   [employer] of Occupational Health - Occupational Stress Questionnaire    Feeling of Stress : Only a  little  Social Connections: Not on file  Intimate Partner Violence: Not on file    Past Surgical History:  Procedure Laterality Date   ANGIOPLASTY     CORONARY STENT INTERVENTION N/A 02/02/2017   Procedure: Coronary Stent Intervention;  Surgeon: Rinaldo Cloud, MD;  Location: [facility] INVASIVE CV LAB;  Service: Cardiovascular;  Laterality: N/A;   CORONARY STENTS     LEFT HEART CATH AND CORONARY ANGIOGRAPHY N/A 02/02/2017   Procedure: Left Heart Cath and Coronary Angiography;  Surgeon: Rinaldo Cloud, MD;  Location: [facility] INVASIVE CV LAB;  Service: Cardiovascular;  Laterality: N/A;   LEFT HEART  CATHETERIZATION WITH CORONARY ANGIOGRAM N/A 03/16/2012   Procedure: LEFT HEART CATHETERIZATION WITH CORONARY ANGIOGRAM;  Surgeon: Robynn Pane, MD;  Location: [facility] CATH LAB;  Service: Cardiovascular;  Laterality: N/A;   PERCUTANEOUS CORONARY STENT INTERVENTION (PCI-S)  03/16/2012   Procedure: PERCUTANEOUS CORONARY STENT INTERVENTION (PCI-S);  Surgeon: Robynn Pane, MD;  Location: [facility] CATH LAB;  Service: Cardiovascular;;   TONSILLECTOMY      No family history on file.  Allergies  Allergen Reactions   Lisinopril     Other reaction(s): Cough   Metformin Diarrhea    Current Outpatient Medications on File Prior to Visit  Medication Sig Dispense Refill   acetaminophen (TYLENOL) 500 MG tablet Take 500 mg by mouth every 6 (six) hours as needed for moderate pain.     Alogliptin Benzoate 12.5 MG TABS Take 12.5 mg by mouth daily.     amLODipine (NORVASC) 10 MG tablet Take 10 mg by mouth every evening.     aspirin 81 MG tablet Take 81 mg by mouth daily.     atorvastatin (LIPITOR) 80 MG tablet Take 40 mg by mouth every evening.     clopidogrel (PLAVIX) 75 MG tablet Take 75 mg by mouth daily.  3   diclofenac sodium (VOLTAREN) 1 % GEL Apply 2 g topically 4 (four) times daily as needed (pain). 100 g 0   losartan (COZAAR) 100 MG tablet Take 100 mg by mouth daily.      memantine (NAMENDA) 10 MG tablet Take 1 tablet (10 mg total) by mouth 2 (two) times daily. Start only after first finishing starter pack 60 tablet 3   metoprolol succinate (TOPROL-XL) 50 MG 24 hr tablet Take 50 mg by mouth at bedtime. Take with or immediately following a meal.     Multiple Vitamin (MULTIVITAMIN) tablet Take 1 tablet by mouth daily.     psyllium (HYDROCIL/METAMUCIL) 95 % PACK Take 1 packet by mouth daily.     nitroGLYCERIN (NITROSTAT) 0.4 MG SL tablet Place 1 tablet (0.4 mg total) under the tongue every 5 (five) minutes x 3 doses as needed for chest pain. 25 tablet 3   No current facility-administered medications on file  prior to visit.    BP 136/82   Pulse 74   Temp 98.6 F (37 C) (Oral)   Ht 5\' 4"  (1.626 m)   SpO2 98%   BMI 23.73 kg/m  Objective:   Physical Exam Cardiovascular:     Rate and Rhythm: Normal rate and regular rhythm.  Pulmonary:     Effort: Pulmonary effort is normal.     Breath sounds: Normal breath sounds. No wheezing or rales.  Musculoskeletal:     Cervical back: Neck supple.  Skin:    General: Skin is warm and dry.  Neurological:     Mental Status: He is alert.     Comments: Cannot answer questions appropriately during  HPI           Assessment & Plan:   Problem List Items Addressed This Visit       Cardiovascular and Mediastinum   CAD (coronary artery disease)    Following with cardiology. Recent hospitalization for NSTEMI, Hospital notes, labs, imaging reviewed.  Continue clopidogrel 75 mg daily, metoprolol succinate 50 mg daily, atorvastatin 80 mg daily.  Follow-up with cardiology as scheduled.      Type 2 diabetes mellitus with vascular disease ([place])    Controlled with A1c of 7.2 on recent labs. Follows with the [facility].  Continue off medications.      Essential hypertension    Controlled.  Continue losartan 100 mg daily, metoprolol succinate 50 mg daily, amlodipine 10 mg daily. Follows with the [place] in [place] and cardiology.      NSTEMI (non-ST elevated myocardial infarction) [place])    Recent hospitalization, hospital notes, labs, imaging reviewed.  Continue conservative care with medication management.  Continue clopidogrel 75 mg daily, metoprolol succinate 50 mg daily, losartan 100 mg daily, atorvastatin 80 mg daily.        Nervous and Auditory   Unspecified dementia, unspecified severity, without behavioral disturbance, psychotic disturbance, mood disturbance, and anxiety ([place])    Continued, progressing per wife.  Following with the [facility].  Continue Namenda 10 mg twice daily          Pleas Koch, NP

## 2022-02-10 NOTE — Patient Instructions (Signed)
Follow up with cardiology as scheduled.  It was a pleasure to see you today!   

## 2022-02-10 NOTE — Assessment & Plan Note (Signed)
Controlled with A1c of 7.2 on recent labs. Follows with the [facility].  Continue off medications.

## 2022-02-10 NOTE — Assessment & Plan Note (Signed)
Controlled.  Continue losartan 100 mg daily, metoprolol succinate 50 mg daily, amlodipine 10 mg daily. Follows with the [place] in [place] and cardiology.

## 2022-02-10 NOTE — Assessment & Plan Note (Signed)
Continued, progressing per wife.  Following with the [facility].  Continue Namenda 10 mg twice daily

## 2022-02-10 NOTE — Assessment & Plan Note (Signed)
Following with cardiology. Recent hospitalization for NSTEMI, Hospital notes, labs, imaging reviewed.  Continue clopidogrel 75 mg daily, metoprolol succinate 50 mg daily, atorvastatin 80 mg daily.  Follow-up with cardiology as scheduled.

## 2022-02-10 NOTE — Assessment & Plan Note (Signed)
Recent hospitalization, hospital notes, labs, imaging reviewed.  Continue conservative care with medication management.  Continue clopidogrel 75 mg daily, metoprolol succinate 50 mg daily, losartan 100 mg daily, atorvastatin 80 mg daily.

## 2022-02-15 DIAGNOSIS — E785 Hyperlipidemia, unspecified: Secondary | ICD-10-CM | POA: Diagnosis not present

## 2022-02-15 DIAGNOSIS — I10 Essential (primary) hypertension: Secondary | ICD-10-CM | POA: Diagnosis not present

## 2022-03-09 ENCOUNTER — Ambulatory Visit: Payer: Medicare Other | Admitting: Podiatry

## 2022-04-04 DIAGNOSIS — Z20822 Contact with and (suspected) exposure to covid-19: Secondary | ICD-10-CM | POA: Diagnosis not present

## 2022-04-04 DIAGNOSIS — Z03818 Encounter for observation for suspected exposure to other biological agents ruled out: Secondary | ICD-10-CM | POA: Diagnosis not present

## 2022-04-08 DIAGNOSIS — R051 Acute cough: Secondary | ICD-10-CM | POA: Diagnosis not present

## 2022-04-08 DIAGNOSIS — U071 COVID-19: Secondary | ICD-10-CM | POA: Diagnosis not present

## 2022-04-11 ENCOUNTER — Telehealth (INDEPENDENT_AMBULATORY_CARE_PROVIDER_SITE_OTHER): Payer: Medicare Other | Admitting: Family Medicine

## 2022-04-11 ENCOUNTER — Telehealth: Payer: Self-pay | Admitting: *Deleted

## 2022-04-11 ENCOUNTER — Encounter: Payer: Self-pay | Admitting: Family Medicine

## 2022-04-11 VITALS — Temp 98.4°F | Ht 64.0 in

## 2022-04-11 DIAGNOSIS — U071 COVID-19: Secondary | ICD-10-CM | POA: Diagnosis not present

## 2022-04-11 MED ORDER — BENZONATATE 200 MG PO CAPS: 200.0000 mg | ORAL_CAPSULE | Freq: Three times a day (TID) | ORAL | 1 refills | Status: DC | PRN

## 2022-04-11 MED ORDER — GUAIFENESIN-CODEINE 100-10 MG/5ML PO SYRP: 5.0000 mL | ORAL_SOLUTION | Freq: Three times a day (TID) | ORAL | 0 refills | Status: DC | PRN

## 2022-04-11 NOTE — Telephone Encounter (Signed)
Spoke to patient's wife and scheduled a mychart visit today 04/11/22 with Dr. Lorelei Pont. Patient's wife denies that he has a fever, SOB or difficulty breathing. Patient's wife was given ER precautions and she verbalized understanding.

## 2022-04-11 NOTE — Progress Notes (Signed)
Alexander Ayala T. Pinchas Reither, MD, [place] at [facility], [address]  Phone: [phone]  FAX: [place] - 88 y.o. male  MRN [id number]  Date of Birth: 1933/12/29  Date: 04/11/2022  PCP: Pleas Koch, NP  Referral: Pleas Koch, NP  Chief Complaint  Patient presents with   Covid Positive    Positive Covid Test at [facility] last Friday Symptoms started on Sunday   Cough   Virtual Visit via Video Note:  I connected with  [name] on 04/11/2022  3:40 PM EDT by a video enabled telemedicine application and verified that I am speaking with the correct person using two identifiers.   Location patient: home computer, tablet, or smartphone Location provider: work or home office Consent: Verbal consent directly obtained from [employer]. Persons participating in the virtual visit: patient, provider  I discussed the limitations of evaluation and management by telemedicine and the availability of in person appointments. The patient expressed understanding and agreed to proceed.  Chief Complaint  Patient presents with   Covid Positive    Positive Covid Test at [facility] last Friday Symptoms started on Sunday   Cough    History of Present Illness:  Started symptoms 8 days ago.  He had a confirmed COVID-positive test last Friday after his symptoms have been persistent since Sunday.  [facility], and he has since then have some persistent deep cough.  He does not have a fever at all, he denies any GI symptoms including no nausea, vomiting, diarrhea.  He is eating and drinking normally.  No neurological complaints. He also does have a lot of congestion and runny nose.  There is also mild sore throat.  Review of Systems as above: See pertinent positives and pertinent negatives per HPI No acute distress verbally   Observations/Objective/Exam:  An attempt was  made to discern vital signs over the phone and per patient if applicable and possible.   General:    Alert, Oriented, appears well and in no acute distress  Pulmonary:     On inspection no signs of respiratory distress.  Psych / Neurological:     Pleasant and cooperative.  Assessment and Plan:    ICD-10-CM   1. COVID-19  U07.1      COVID-19, outside of the window for antivirals.  Counseled them the best I could about COVID-19, and also we are going to give him some cough medication to use right now.  I discussed the assessment and treatment plan with the patient. The patient was provided an opportunity to ask questions and all were answered. The patient agreed with the plan and demonstrated an understanding of the instructions.   The patient was advised to call back or seek an in-person evaluation if the symptoms worsen or if the condition fails to improve as anticipated.  Follow-up: prn unless noted otherwise below No follow-ups on file.  Meds ordered this encounter  Medications   benzonatate (TESSALON) 200 MG capsule    Sig: Take 1 capsule (200 mg total) by mouth 3 (three) times daily as needed for cough.    Dispense:  50 capsule    Refill:  1   guaiFENesin-codeine (ROBITUSSIN AC) 100-10 MG/5ML syrup    Sig: Take 5 mLs by mouth 3 (three) times daily as needed for cough.    Dispense:  120 mL    Refill:  0  No orders of the defined types were placed in this encounter.   Signed,  Maud Deed. Treniyah Lynn, MD

## 2022-04-11 NOTE — Telephone Encounter (Signed)
PLEASE NOTE: All timestamps contained within this report are represented as [place] Standard Time. CONFIDENTIALTY NOTICE: This fax transmission is intended only for the addressee. It contains information that is legally privileged, confidential or otherwise protected from use or disclosure. If you are not the intended recipient, you are strictly prohibited from reviewing, disclosing, copying using or disseminating any of this information or taking any action in reliance on or regarding this information. If you have received this fax in error, please notify us immediately by telephone so that we can arrange for its return to [place]. Phone: [phone], Toll-Free: [phone], Fax: [phone] Page: 1 of 3 Call Id: [id number] [facility] Night - Client TELEPHONE ADVICE RECORD AccessNurse Patient Name: Alexander Donaldson Gender: Male DOB: 07/04/1934 Age: 88 Y 3 M 21 D Return Phone Number: [phone] (Primary) Address: City/ State/ Zip: [place]  [address] Client [facility] Night - Client Client Site [facility] - Night Provider Vernona Rieger - NP Contact Type Call Who Is Calling Patient / Member / Family / Caregiver Call Type Triage / Clinical Caller Name Luian Schumpert Relationship To Patient Spouse Return Phone Number [phone] (Primary) Chief Complaint Cough Reason for Call Symptomatic / Request for Health Information Initial Comment Chart 2 of 2 - Caller states he is positive for Covid19. The pt is coughing. Caller is asking what can the pt be taken for the sx? Nurse Assessment Nurse: Henri Medal, RN, Amy Date/Time Lamount Cohen Time): 04/09/2022 1:32:51 PM Confirm and document reason for call. If symptomatic, describe symptoms. ---Caller states her husband is positive for Covid19. He is coughing. Sx started Monday. He went & got tested & it was negative. His cough got worse Wed., so they both got tested, & he  was positive that time. Does the patient have any new or worsening symptoms? ---Yes Will a triage be completed? ---Yes Related visit to physician within the last 2 weeks? ---No Does the PT have any chronic conditions? (i.e. diabetes, asthma, this includes High risk factors for pregnancy, etc.) ---Yes List chronic conditions. ---dementia Is this a behavioral health or substance abuse call? ---No Guidelines Guideline Title Affirmed Question Affirmed Notes Nurse Date/Time (Eastern Time) COVID-19 - Diagnosed or Suspected [1] HIGH RISK patient (e.g., weak immune system, age > 64 years, obesity with BMI 30 or higher, pregnant, chronic lung disease or other chronic medical condition) Lovelace, RN, Amy 04/09/2022 1:43:36 PM PLEASE NOTE: All timestamps contained within this report are represented as [place] Standard Time. CONFIDENTIALTY NOTICE: This fax transmission is intended only for the addressee. It contains information that is legally privileged, confidential or otherwise protected from use or disclosure. If you are not the intended recipient, you are strictly prohibited from reviewing, disclosing, copying using or disseminating any of this information or taking any action in reliance on or regarding this information. If you have received this fax in error, please notify us immediately by telephone so that we can arrange for its return to [place]. Phone: [phone], Toll-Free: [phone], Fax: [phone] Page: 2 of 3 Call Id: [id number] Guidelines Guideline Title Affirmed Question Affirmed Notes Nurse Date/Time Lamount Cohen Time) AND [2] COVID symptoms (e.g., cough, fever) (Exceptions: Already seen by PCP and no new or worsening symptoms.) Disp. Time Lamount Cohen Time) Disposition Final User 04/09/2022 1:14:25 PM Attempt made - message left Lovelace, RN, Amy 04/09/2022 1:44:29 PM Call PCP within 24 Hours Yes Lovelace, RN, Amy 04/09/2022 2:11:34 PM Called On-Call Provider Lovelace,  RN, Amy Final Disposition 04/09/2022  1:44:29 PM Call PCP within 24 Hours Yes Lovelace, RN, Amy Caller Disagree/Comply Comply Caller Understands Yes PreDisposition InappropriateToAsk Care Advice Given Per Guideline CALL PCP WITHIN 24 HOURS: * You need to discuss this with your doctor (or NP/PA) within the next 24 hours. * IF OFFICE WILL BE CLOSED: I'll page the on-call provider now. EXCEPTION: from 9 pm to 9 am. Since this isn't urgent, we'll hold the page until morning. NOTE TO TRIAGER - COVID-19 AND PATIENTS WITH HIGH RISK CONDITIONS: * TRIAGER JUDGMENT: Use your judgment regarding timing for follow-up. Patients with more severe symptoms or more severe chronic health problems may need to be evaluated by a doctor (or NP/PA) sooner. People at high risk of complications should start treatment as soon as possible. Consider calling PCP NOW if options for treatment are currently available in the community (e.g., pharmacies are open). GENERAL CARE ADVICE FOR COVID-19 SYMPTOMS: * Feeling dehydrated: Drink extra liquids. If the air in your home is dry, use a humidifier. * Fever, Chills, and Sweats: For fever over 101 F (38.3 C), take acetaminophen every 4 to 6 hours (Adults 650 mg) OR ibuprofen every 6 to 8 hours (Adults 400 mg). Before taking any medicine, read all the instructions on the package. Do not take aspirin unless your doctor has prescribed it for you. Chills can sometimes come before a fever. You may feel cold in your hands and feet. You may have shivering. You may also feel sweaty as your body temperature goes down. * Muscle aches, headache, and other pains: Often this comes and goes with the fever. Take acetaminophen every 4 to 6 hours (Adults 650 mg) OR ibuprofen every 6 to 8 hours (Adults 400 mg). Before taking any medicine, read all the instructions on the package. COUGH MEDICINES: * COUGH SYRUP WITH DEXTROMETHORPHAN: An over-the-counter cough syrup can help your cough. The most  common cough suppressant in over-the-counter cough medicines is dextromethorphan. * HOME REMEDY - HONEY: This old home remedy has been shown to help decrease coughing at night. The adult dosage is 2 teaspoons (10 ml) at bedtime. COVID-19 - HOW TO PROTECT OTHERS - WHEN YOU ARE SICK WITH COVID-19: * STAY HOME A MINIMUM OF 5 DAYS: People with MILD COVID-19 can STOP HOME ISOLATION AFTER 5 DAYS if (1) fever has been gone for 24 hours (without using fever medicine) AND (2) symptoms are better. Continue to wear a well-fitted mask for a full 10 days when around others. * WEAR A MASK FOR 10 DAYS: Wear a well-fitted mask for 10 full days any time you are around others inside your home or in public. Do not go to places where you are unable to wear a mask. CALL BACK IF: * You become worse CARE ADVICE given per COVID-19 - DIAGNOSED OR SUSPECTED (Adult) guideline. PLEASE NOTE: All timestamps contained within this report are represented as [place] Standard Time. CONFIDENTIALTY NOTICE: This fax transmission is intended only for the addressee. It contains information that is legally privileged, confidential or otherwise protected from use or disclosure. If you are not the intended recipient, you are strictly prohibited from reviewing, disclosing, copying using or disseminating any of this information or taking any action in reliance on or regarding this information. If you have received this fax in error, please notify us immediately by telephone so that we can arrange for its return to [place]. Phone: [phone], Toll-Free: [phone], Fax: [phone] Page: 3 of 3 Call Id: [id number] Comments User: Wayne Sever, RN Date/Time Eilene Ghazi Time): 04/09/2022 1:46:01  PM If On Call Dr Rx's Paxlovid, caller [facility] [place] at [phone]. Paging DoctorName Phone DateTime Result/ Outcome Message Type Notes Lezlie Octave- MD [phone] 04/09/2022 2:11:34 PM Called On Call Provider - Reached Doctor  Paged Lezlie Octave- MD 04/09/2022 2:12:57 PM Spoke with On Call - General Message Result Notified Dr. Beverely Low of patient condition & [facility] PCP Within 24 Hour Outcome. She states the pt. is out of the window for Rx Paxlovid, so she recommended treating pain with Tylenol, drinking plenty of fluids, & resting. She stated to call back if his sx change or are more severe.

## 2022-04-13 ENCOUNTER — Ambulatory Visit: Payer: Medicare Other | Admitting: Neurology

## 2022-04-18 ENCOUNTER — Telehealth: Payer: Self-pay

## 2022-04-18 NOTE — Telephone Encounter (Signed)
[facility] - Client TELEPHONE ADVICE RECORD [facility] Patient Name: Alexander Donaldson Gender: Male DOB: 1934-02-15 Age: 88 Y 3 M 28 D Return Phone Number: [phone] (Primary) Address: City/ State/ Zip: [place]  [address] Client [facility] - Client Client Site [place] Provider Alma Friendly - NP Contact Type Call Who Is Calling Patient / Member / Family / Caregiver Call Type Triage / Clinical Caller Name Santiago Glad Relationship To Patient Spouse Return Phone Number [phone] (Primary) Chief Complaint Blood Pressure High Reason for Call Symptomatic / Request for [place] states that her husband has COVID and has been sweating. Caller states that she feels like he is abnormal. Caller states that her husbands blood pressure is high but not that high. Caller states that she struggled to wake her husband this morning and called the emergency services who told her that her husband is fine and just to follow up with normal doctor. Caller states that her husband is coughing. Translation No Nurse Assessment Nurse: Hendricks Limes, RN, Kennyth Arnold Date/Time (Eastern Time): 04/16/2022 7:15:04 PM Confirm and document reason for call. If symptomatic, describe symptoms. ---Caller states it was harder to wake her husband up at 30 and he was out of it so she called EMS and he was fine and EMS left. Caller states husband is COVID+ 04/08/22 had a tele-visit. Caller states husband has a cough x 1 week ago. Caller reports no other symptoms. Does the patient have any new or worsening symptoms? ---Yes Will a triage be completed? ---Yes Related visit to physician within the last 2 weeks? ---Yes Does the PT have any chronic conditions? (i.e. diabetes, asthma, this includes High risk factors for pregnancy, etc.) ---Yes List chronic conditions. ---dementia,  6 heart stents, DM2 Is this a behavioral health or substance abuse call? ---No PLEASE NOTE: All timestamps contained within this report are represented as [place] Standard Time. CONFIDENTIALTY NOTICE: This fax transmission is intended only for the addressee. It contains information that is legally privileged, confidential or otherwise protected from use or disclosure. If you are not the intended recipient, you are strictly prohibited from reviewing, disclosing, copying using or disseminating any of this information or taking any action in reliance on or regarding this information. If you have received this fax in error, please notify us immediately by telephone so that we can arrange for its return to [place]. Phone: [phone], Toll-Free: [phone], Fax: [phone] Page: 2 of 2 Call Id: [id number] Guidelines Guideline Title Affirmed Question Affirmed Notes Nurse Date/Time Eilene Ghazi Time) COVID-19 - Persisting Symptoms Follow-up Call [1] PERSISTING SYMPTOMS OF COVID-19 AND [2] symptoms BETTER (improving) Hendricks Limes, RN, Kennyth Arnold 04/16/2022 7:19:54 PM Disp. Time Eilene Ghazi Time) Disposition Final User 04/16/2022 7:24:19 PM Home Care Yes Hendricks Limes, RN, Kennyth Arnold Final Disposition 04/16/2022 7:24:19 PM [place], RN, Kennyth Arnold Caller Disagree/Comply Comply Caller Understands Yes PreDisposition Did not know what to do Care Advice Given Per Guideline HOME CARE: * Regular follow-up appointments with your doctor (or NP/PA) are important until you feel better. CARE ADVICE given per COVID-19 - Persisting Symptoms Follow-Up Call (Adult) guideline. CALL BACK IF: * You become worse * Stay hydrated: Drink plenty of liquids. Not drinking enough fluids and being a little dehydrated is a common cause of fatigue and weakness. If you think you are dehydrated, drink several glasses of fruit juice, other clear fluids, or water. This should help

## 2022-04-18 NOTE — Telephone Encounter (Signed)
I spoke with pts wife and pt has not had anymore problems waking pt up; pt still has dry cough from + covid on 05/08/22 and symptoms began on 05/04/22. Pts wife said pts BP is OK now. Scheduled appt with Gentry Fitz NP on 04/19/22 at 10:40 with UC & ED precautions and pts wife voiced understanding. Sending note to Gentry Fitz NP and [employer].

## 2022-04-18 NOTE — Telephone Encounter (Signed)
Noted.  Will evaluate as scheduled. 

## 2022-04-19 ENCOUNTER — Ambulatory Visit (INDEPENDENT_AMBULATORY_CARE_PROVIDER_SITE_OTHER): Payer: Medicare Other | Admitting: Primary Care

## 2022-04-19 ENCOUNTER — Encounter: Payer: Self-pay | Admitting: Primary Care

## 2022-04-19 ENCOUNTER — Ambulatory Visit (INDEPENDENT_AMBULATORY_CARE_PROVIDER_SITE_OTHER)
Admission: RE | Admit: 2022-04-19 | Discharge: 2022-04-19 | Disposition: A | Discharge status: Home / Self Care | Payer: Medicare Other | Source: Ambulatory Visit | Attending: Primary Care | Admitting: Primary Care

## 2022-04-19 DIAGNOSIS — R051 Acute cough: Secondary | ICD-10-CM

## 2022-04-19 DIAGNOSIS — R059 Cough, unspecified: Secondary | ICD-10-CM | POA: Diagnosis not present

## 2022-04-19 DIAGNOSIS — R0989 Other specified symptoms and signs involving the circulatory and respiratory systems: Secondary | ICD-10-CM | POA: Diagnosis not present

## 2022-04-19 NOTE — Progress Notes (Signed)
Subjective:    Patient ID: Alexander Donaldson, male    DOB: May 27, 1934, 88 y.o.   MRN: [id number]  Cough Pertinent negatives include no fever, headaches, sore throat or shortness of breath.    Alexander Donaldson is a very pleasant 88 y.o. male with a history of CAD, type 2 diabetes, hypertension, dementia who presents today to discuss cough. His wife is with him today who is providing most of the information for HPI.  Evaluated virtually by Dr. Lorelei Pont on 04/11/22 for an 8 day history of cough. Covid-19 test was positive three days prior. During this visit he was treated with Tessalon Perles and Robitussin AC.   Today his wife mentions that she never gave him the Robitussin [facility] as she became nervous about the potential side effects. He only took one of the [employer] as he tried to chew the capsule. His wife has been giving him Delsym an using a vaporizer. His cough is overall better, mostly worse at night. He does continue to cough during the day some.   His wife denies fevers. His appetite is good and is drinking lots of liquids. She is concerned about his lungs as his cough can become deep at times.    Review of Systems  Constitutional:  Negative for fatigue and fever.  HENT:  Positive for congestion. Negative for sore throat.   Respiratory:  Positive for cough. Negative for shortness of breath.   Neurological:  Negative for headaches.         Past Medical History:  Diagnosis Date   Acute encephalopathy 02/12/2018   CAP (community acquired pneumonia) 02/11/2018   Carpal tunnel syndrome, bilateral 02/25/2020   Coronary artery disease    Diabetes mellitus    Diverticulitis    Hypertension    Hypokalemia 02/12/2018    Social History   Socioeconomic History   Marital status: Married    Spouse name: Santiago Glad   Number of children: Not on file   Years of education: Not on file   Highest education level: Not on file  Occupational History   Not on file  Tobacco Use   Smoking status:  Never   Smokeless tobacco: Never  Vaping Use   Vaping Use: Never used  Substance and Sexual Activity   Alcohol use: No    Comment: social   Drug use: No   Sexual activity: Yes    Birth control/protection: None  Other Topics Concern   Not on file  Social History Narrative   Lives with wife   Right Handed   Drinks 1-2 cups caffeine daily   Social Determinants of Health   Financial Resource Strain: Low Risk  (12/30/2021)   Overall Financial Resource Strain (CARDIA)    Difficulty of Paying Living Expenses: Not hard at all  Food Insecurity: No Food Insecurity (12/30/2021)   Hunger Vital Sign    Worried About Running Out of Food in the Last Year: Never true    Ran Out of Food in the Last Year: Never true  Transportation Needs: No Transportation Needs (12/30/2021)   PRAPARE - Transportation    Lack of Transportation (Medical): No    Lack of Transportation (Non-Medical): No  Physical Activity: Inactive (12/30/2021)   Exercise Vital Sign    Days of Exercise per Week: 0 days    Minutes of Exercise per Session: 0 min  Stress: No Stress Concern Present (12/30/2021)   [place]    Feeling of  Stress : Only a little  Social Connections: Not on file  Intimate Partner Violence: Not on file    Past Surgical History:  Procedure Laterality Date   ANGIOPLASTY     CORONARY STENT INTERVENTION N/A 02/02/2017   Procedure: Coronary Stent Intervention;  Surgeon: Rinaldo Cloud, MD;  Location: [facility] INVASIVE CV LAB;  Service: Cardiovascular;  Laterality: N/A;   CORONARY STENTS     LEFT HEART CATH AND CORONARY ANGIOGRAPHY N/A 02/02/2017   Procedure: Left Heart Cath and Coronary Angiography;  Surgeon: Rinaldo Cloud, MD;  Location: [facility] INVASIVE CV LAB;  Service: Cardiovascular;  Laterality: N/A;   LEFT HEART CATHETERIZATION WITH CORONARY ANGIOGRAM N/A 03/16/2012   Procedure: LEFT HEART CATHETERIZATION WITH CORONARY ANGIOGRAM;  Surgeon: Robynn Pane, MD;  Location: [facility] CATH LAB;  Service: Cardiovascular;  Laterality: N/A;   PERCUTANEOUS CORONARY STENT INTERVENTION (PCI-S)  03/16/2012   Procedure: PERCUTANEOUS CORONARY STENT INTERVENTION (PCI-S);  Surgeon: Robynn Pane, MD;  Location: [facility] CATH LAB;  Service: Cardiovascular;;   TONSILLECTOMY      History reviewed. No pertinent family history.  Allergies  Allergen Reactions   Lisinopril     Other reaction(s): Cough   Metformin Diarrhea    Current Outpatient Medications on File Prior to Visit  Medication Sig Dispense Refill   acetaminophen (TYLENOL) 500 MG tablet Take 500 mg by mouth every 6 (six) hours as needed for moderate pain.     Alogliptin Benzoate 12.5 MG TABS Take 12.5 mg by mouth daily.     amLODipine (NORVASC) 10 MG tablet Take 10 mg by mouth every evening.     aspirin 81 MG tablet Take 81 mg by mouth daily.     atorvastatin (LIPITOR) 80 MG tablet Take 40 mg by mouth every evening.     clopidogrel (PLAVIX) 75 MG tablet Take 75 mg by mouth daily.  3   diclofenac sodium (VOLTAREN) 1 % GEL Apply 2 g topically 4 (four) times daily as needed (pain). 100 g 0   losartan (COZAAR) 100 MG tablet Take 100 mg by mouth daily.      memantine (NAMENDA) 10 MG tablet Take 1 tablet (10 mg total) by mouth 2 (two) times daily. Start only after first finishing starter pack 60 tablet 3   metoprolol succinate (TOPROL-XL) 50 MG 24 hr tablet Take 50 mg by mouth at bedtime. Take with or immediately following a meal.     Multiple Vitamin (MULTIVITAMIN) tablet Take 1 tablet by mouth daily.     nitroGLYCERIN (NITROSTAT) 0.4 MG SL tablet Place 1 tablet (0.4 mg total) under the tongue every 5 (five) minutes x 3 doses as needed for chest pain. 25 tablet 3   psyllium (HYDROCIL/METAMUCIL) 95 % PACK Take 1 packet by mouth daily.     benzonatate (TESSALON) 200 MG capsule Take 1 capsule (200 mg total) by mouth 3 (three) times daily as needed for cough. (Patient not taking: Reported on 04/19/2022) 50  capsule 1   guaiFENesin-codeine (ROBITUSSIN AC) 100-10 MG/5ML syrup Take 5 mLs by mouth 3 (three) times daily as needed for cough. (Patient not taking: Reported on 04/19/2022) 120 mL 0   No current facility-administered medications on file prior to visit.    BP 128/62   Pulse 62   Temp (!) 96.4 F (35.8 C) (Temporal)   Ht 5\' 4"  (1.626 m)   Wt 160 lb (72.6 kg)   SpO2 98%   BMI 27.46 kg/m  Objective:   Physical Exam Constitutional:  Appearance: He is not ill-appearing.  Cardiovascular:     Rate and Rhythm: Normal rate and regular rhythm.  Pulmonary:     Effort: Pulmonary effort is normal.     Breath sounds: Examination of the left-upper field reveals rhonchi. Examination of the right-lower field reveals rhonchi. Examination of the left-lower field reveals rhonchi. Rhonchi present. No wheezing or rales.  Musculoskeletal:     Cervical back: Neck supple.  Skin:    General: Skin is warm and dry.  Neurological:     Mental Status: He is alert and oriented to person, place, and time.           Assessment & Plan:   Problem List Items Addressed This Visit       Other   Acute cough    Abnormal lung sounds noted on exam which could be remnants from Covid-19 infection. Given his age, history of dementia, and exam today we will check chest xray to rule out pneumonia.  Fortunately, he has improved overall per wife.  Chest xray ordered and pending. Continue Delsym PRN. Add famotidine 20 mg HS for a potentially reflux induced cough.  Await results.  He is safe for outpatient treatment.      Relevant Orders   DG Chest 2 View       Doreene Nest, NP

## 2022-04-19 NOTE — Patient Instructions (Signed)
Complete xray(s) prior to leaving today. I will notify you of your results once received.  Add famotidine 20 mg at bedtime for cough.  It was a pleasure to see you today!

## 2022-04-19 NOTE — Assessment & Plan Note (Addendum)
Abnormal lung sounds noted on exam which could be remnants from Covid-19 infection. Given his age, history of dementia, and exam today we will check chest xray to rule out pneumonia.  Fortunately, he has improved overall per wife.  Chest xray ordered and pending. Continue Delsym PRN. Add famotidine 20 mg HS for a potentially reflux induced cough.  Await results.  He is safe for outpatient treatment.

## 2022-04-20 ENCOUNTER — Other Ambulatory Visit (HOSPITAL_COMMUNITY): Payer: Self-pay

## 2022-04-20 ENCOUNTER — Other Ambulatory Visit: Payer: Self-pay | Admitting: Primary Care

## 2022-04-20 DIAGNOSIS — R051 Acute cough: Secondary | ICD-10-CM

## 2022-04-20 MED ORDER — AMOXICILLIN-POT CLAVULANATE 875-125 MG PO TABS
1.0000 | ORAL_TABLET | Freq: Two times a day (BID) | ORAL | 0 refills | Status: DC
  Filled 2022-04-20: qty 14, 7d supply, fill #0

## 2022-04-21 ENCOUNTER — Other Ambulatory Visit (HOSPITAL_COMMUNITY): Payer: Self-pay

## 2022-04-21 ENCOUNTER — Other Ambulatory Visit: Payer: Self-pay | Admitting: Primary Care

## 2022-04-21 DIAGNOSIS — R051 Acute cough: Secondary | ICD-10-CM

## 2022-04-21 MED ORDER — AMOXICILLIN-POT CLAVULANATE 400-57 MG/5ML PO SUSR
800.0000 mg | Freq: Two times a day (BID) | ORAL | 0 refills | Status: DC
  Filled 2022-04-21: qty 200, 7d supply, fill #0

## 2022-04-25 ENCOUNTER — Encounter: Payer: Self-pay | Admitting: Primary Care

## 2022-05-17 DIAGNOSIS — G459 Transient cerebral ischemic attack, unspecified: Secondary | ICD-10-CM | POA: Diagnosis not present

## 2022-05-17 DIAGNOSIS — I255 Ischemic cardiomyopathy: Secondary | ICD-10-CM | POA: Diagnosis not present

## 2022-05-17 DIAGNOSIS — I2510 Atherosclerotic heart disease of native coronary artery without angina pectoris: Secondary | ICD-10-CM | POA: Diagnosis not present

## 2022-05-17 DIAGNOSIS — I480 Paroxysmal atrial fibrillation: Secondary | ICD-10-CM | POA: Diagnosis not present

## 2022-06-20 ENCOUNTER — Encounter: Payer: Self-pay | Admitting: Podiatry

## 2022-06-20 ENCOUNTER — Ambulatory Visit (INDEPENDENT_AMBULATORY_CARE_PROVIDER_SITE_OTHER): Payer: Medicare Other | Admitting: Podiatry

## 2022-06-20 VITALS — BP 155/73

## 2022-06-20 DIAGNOSIS — M79675 Pain in left toe(s): Secondary | ICD-10-CM | POA: Diagnosis not present

## 2022-06-20 DIAGNOSIS — E1159 Type 2 diabetes mellitus with other circulatory complications: Secondary | ICD-10-CM | POA: Diagnosis not present

## 2022-06-20 DIAGNOSIS — B351 Tinea unguium: Secondary | ICD-10-CM

## 2022-06-20 DIAGNOSIS — M79674 Pain in right toe(s): Secondary | ICD-10-CM | POA: Diagnosis not present

## 2022-06-20 NOTE — Progress Notes (Signed)
  Subjective:  Patient ID: Alexander Donaldson, male    DOB: 29-Apr-1934,  MRN: [id number]  Alexander Donaldson presents to clinic today for at risk foot care. Pt has h/o NIDDM with PAD and painful thick toenails that are difficult to trim. Pain interferes with ambulation. Aggravating factors include wearing enclosed shoe gear. Pain is relieved with periodic professional debridement.  Chief Complaint  Patient presents with   Nail Problem    [facility] BS-did not check today A1C-7.2 PCP-Katherine Clark  PCP VST-04/2022   New problem(s): None.   Patient is accompanied by his wife on today's visit.  PCP is Doreene Nest, NP.  Allergies  Allergen Reactions   Lisinopril     Other reaction(s): Cough   Metformin Diarrhea   Review of Systems: Negative except as noted in the HPI.  Objective: No changes noted in today's physical examination. Vitals:   06/20/22 1512  BP: (!) 155/73   Alexander Donaldson is a pleasant 88 y.o. male WD, WN in NAD. AAO x 3.  Vascular Examination: CFT <4 seconds b/l. DP pulses faintly palpable b/l. PT pulses nonpalpable b/l. Digital hair absent. Skin temperature gradient warm to warm b/l. No pain with calf compression. No ischemia or gangrene. No cyanosis or clubbing noted b/l. No edema noted b/l LE.   Neurological Examination: Pt has subjective symptoms of neuropathy. Protective sensation diminished with 10g monofilament b/l.  Dermatological Examination: Pedal skin warm and supple b/l. No open wounds b/l. No interdigital macerations. Toenails 1-5 b/l thick, discolored, elongated with subungual debris and pain on dorsal palpation.  No hyperkeratotic nor porokeratotic lesions present on today's visit.  Musculoskeletal Examination: Muscle strength 5/5 to RLE. Dropfoot left lower extremity. Hammertoe deformity noted 2-5 b/l.  Radiographs: None  Last HgA1c:      Latest Ref Rng & Units 01/30/2022    3:04 AM  Hemoglobin A1C  Hemoglobin-A1c 4.8 - 5.6 % 7.2     Assessment/Plan: 1. Pain due to onychomycosis of toenails of both feet   2. Type 2 diabetes mellitus with vascular disease (HCC)     No orders of the defined types were placed in this encounter.  -Patient with h/o dementia/Alzheimer's/cognitive deficit. Patient's family member present. All questions/concerns addressed on today's visit. -Consent given for treatment as described below: -Continue foot and shoe inspections daily. Monitor blood glucose per PCP/Endocrinologist's recommendations. -Patient to continue soft, supportive shoe gear daily. -Toenails 1-5 b/l were debrided in length and girth with sterile nail nippers and dremel without iatrogenic bleeding.  -Patient/POA to call should there be question/concern in the interim.   Return in about 3 months (around 09/19/2022).  Freddie Breech, DPM

## 2022-07-05 ENCOUNTER — Ambulatory Visit (INDEPENDENT_AMBULATORY_CARE_PROVIDER_SITE_OTHER): Payer: Medicare Other | Admitting: Neurology

## 2022-07-05 ENCOUNTER — Encounter: Payer: Self-pay | Admitting: Neurology

## 2022-07-05 VITALS — BP 128/64 | HR 67 | Ht 64.0 in | Wt 162.0 lb

## 2022-07-05 DIAGNOSIS — G301 Alzheimer's disease with late onset: Secondary | ICD-10-CM

## 2022-07-05 DIAGNOSIS — F02B0 Dementia in other diseases classified elsewhere, moderate, without behavioral disturbance, psychotic disturbance, mood disturbance, and anxiety: Secondary | ICD-10-CM | POA: Diagnosis not present

## 2022-07-05 DIAGNOSIS — M542 Cervicalgia: Secondary | ICD-10-CM

## 2022-07-05 DIAGNOSIS — M2550 Pain in unspecified joint: Secondary | ICD-10-CM | POA: Insufficient documentation

## 2022-07-05 NOTE — Progress Notes (Signed)
[facility] [address] [place]. [place] [address] [phone]       OFFICE FOLLOW UP VISIT NOTE  Alexander Donaldson Date of Birth:  [birth year] Medical Record Number:  [id number]   Referring MD: Sammuel Cooper, NP Reason for Referral: Hand numbness HPI: Initial visit 01/20/2020: [name] is a pleasant 88 year old African-American male with past medical history of diabetes, hypertension, diverticulitis, coronary artery disease who has been complaining of bilateral hand numbness for the last 6 months.  Reports it is intermittently but most prominently at night or early in the morning when he wakes up with his right thumb and index finger feeling stiff and numb.  He feels his strength is good and he can still hold objects very well but his fingers feel different.  This is often severe at night and he often wakes up with this.  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  He has not been wearing any wrist splints.  Denies any neck pain, radicular pain, gait or balance problems.  Review of electronic medical records show that he had lab work on 11/28/2019 which include vitamin B12 which was 284 mg percent and hemoglobin A1c was elevated at 8.5.  Comprehensive metabolic panel labs were normal.  TSH was 1.91 on 01/22/2019.  She has not had any x-rays of his C-spine, MRI or EMG nerve conduction studies done. Update 03/31/2020: He returns for follow-up after last visit 2 months ago.  He continues to have weakness in his hand but states that the numbness is only in the index and middle fingertips and is not as bothersome.  EMG nerve conduction study done by Dr. Anne Hahn on 02/25/2020 confirmed severe end-stage carpal tunnel bilaterally right more than left with wasting and no activity in the right abductor pollicis on stimulation.  There is also chronic stable overlying C5 radiculopathy.  Patient's wife reports  today that for the last year or so she is noted progressive cognitive decline.  Recent short-term memory difficulties.  Is also had some behavioral changes and gets irritable and agitated easily.  Is quite forgetful.  Will not follow multistage commands and often gets distracted easily.  Also cannot have a proper conversation at times gets sidetracked.  She has been doing and more things for him.  Patient has no family history of Alzheimer's.  He has no prior history of strokes, TIA, seizures or head injury with loss of consciousness.  He has not had any recent brain imaging done. Update 09/16/2020: He returns for follow-up after last visit 6 months ago.  He is accompanied by his wife.  Patient had lab work at last visit for reversible causes of memory loss which was all normal.  Patient refused to undergo EEG and MRI which were ordered.  He did try the Namenda but was not able to tolerate 5 mg twice daily and developed significant sleepiness and hence it was stopped after a few weeks.  He continues to have significant memory and cognitive difficulties.  He requires constant supervision and his wife is around him most of the time.  Patient did see Dr. Franky Macho for his severe carpal tunnel with he refused to have surgery.  Continues to have numbness and tingling in his hands intermittently but still has good strength.  Patient of likely had an MRI done at the [place] in August 2020 and his wife  states she is trying to get me the report of the disc to review. Update 05/24/2021 : He returns for follow-up after last visit 8 months ago.  Is accompanied by his wife.  Patient continues to have cognitive decline.  He now cannot be left alone.  Patient has benefits through the [place] and now has 20 hours a week of sitter at home with wife gets some relief.  Patient was not able to tolerate Aricept due to side effects and it was discontinued.  Patient medications are supervised by the wife.  He is confused at baseline and disoriented.   He can follow directions and can be redirected.  He ambulates independently.  There is no delusions hallucinations, unsafe behavior or violent behavior noted.  He had EEG done on 11/12/2020 which was normal.  He was seen briefly in the ER on 01/06/2021 for transient confusion.  He has no new complaints today.  He still has not been complaining of numbness or pain in his hand.  His carpal tunnel symptoms seem to be stable and not bothersome at the moment. Update 09/29/2021 : He returns for follow-up after last visit 4 months ago.  He is accompanied by his wife.  He seems to be tolerating Namenda better than he did Aricept.  Wife has noticed slight improvement in his cognition and times he speaks better and makes more sense.  Short-term memory also at times seems improved.  Patient continues to talk a lot about his past and has trouble making new information or retaining information.  He is mostly calm though occasionally gets irritated.  He denies any delirium, hallucinations or violent behavior.  He now has a caregiver support at home for 20 hours/week through the [place] system.  His children also help out.  His primary care physician recently started new diabetic medication.  He had lab work done at the [facility] on 08/19/2021 and LDL cholesterol was 103 mg percent and hemoglobin A1c was 7.6. Update 07/05/2022 : He returns for follow-up after last visit 9 months ago.  He is accompanied by his wife who provides most of the history.  Patient continues to have significant cognitive impairment and needs 24-hour supervision.  He is now beginning to forget people's names or he can recognize her faces.  He often forgets his wife's name.  He remains on Namenda which is tolerating well without side effects.  Patient can get up by himself and go to the restroom is never left alone at home.  Will get respite care few days a week at home.  Patient's wife is the sole caregiver but gets some help from her children.  Patient is  confused off-and-on mostly at night with sundowning.  He gets vivid dreams.  Does not have delusions, hallucinations, agitation.  He has not exhibited violent or unsafe behavior.  Can ambulate safely with good balance and no falls or injuries.  He is complaining of mild posterior neck pain particularly when he turns his neck to the left.  He has mild tenderness of the spine on the left.  He denies any radicular pain and fall injury to the neck.  He was admitted to the hospital on 01/29/2022 for 3 days for NSTEMI and treated medically and his chest pain resolved. ROS:   14 system review of systems is positive for memory loss, confusion hand numbness, pain, decreased hearing only and all other systems negative  PMH:  Past Medical History:  Diagnosis Date   Acute encephalopathy 02/12/2018  CAP (community acquired pneumonia) 02/11/2018   Carpal tunnel syndrome, bilateral 02/25/2020   Coronary artery disease    Diabetes mellitus    Diverticulitis    Hypertension    Hypokalemia 02/12/2018    Social History:  Social History   Socioeconomic History   Marital status: Married    Spouse name: Clydie Braun   Number of children: Not on file   Years of education: Not on file   Highest education level: Not on file  Occupational History   Not on file  Tobacco Use   Smoking status: Never   Smokeless tobacco: Never  Vaping Use   Vaping Use: Never used  Substance and Sexual Activity   Alcohol use: No    Comment: social   Drug use: No   Sexual activity: Yes    Birth control/protection: None  Other Topics Concern   Not on file  Social History Narrative   Lives with wife   Right Handed   Drinks 1-2 cups caffeine daily   Social Determinants of Health   Financial Resource Strain: Low Risk  (12/30/2021)   Overall Financial Resource Strain (CARDIA)    Difficulty of Paying Living Expenses: Not hard at all  Food Insecurity: No Food Insecurity (12/30/2021)   Hunger Vital Sign    Worried About Running Out of  Food in the Last Year: Never true    Ran Out of Food in the Last Year: Never true  Transportation Needs: No Transportation Needs (12/30/2021)   PRAPARE - Transportation    Lack of Transportation (Medical): No    Lack of Transportation (Non-Medical): No  Physical Activity: Inactive (12/30/2021)   Exercise Vital Sign    Days of Exercise per Week: 0 days    Minutes of Exercise per Session: 0 min  Stress: No Stress Concern Present (12/30/2021)   [employer] of Occupational Health - Occupational Stress Questionnaire    Feeling of Stress : Only a little  Social Connections: Not on file  Intimate Partner Violence: Not on file    Medications:   Current Outpatient Medications on File Prior to Visit  Medication Sig Dispense Refill   acetaminophen (TYLENOL) 500 MG tablet Take 500 mg by mouth every 6 (six) hours as needed for moderate pain.     Alogliptin Benzoate 12.5 MG TABS Take 12.5 mg by mouth daily.     amLODipine (NORVASC) 10 MG tablet Take 10 mg by mouth every evening.     aspirin 81 MG tablet Take 81 mg by mouth daily.     atorvastatin (LIPITOR) 80 MG tablet Take 40 mg by mouth every evening.     clopidogrel (PLAVIX) 75 MG tablet Take 75 mg by mouth daily.  3   diclofenac sodium (VOLTAREN) 1 % GEL Apply 2 g topically 4 (four) times daily as needed (pain). 100 g 0   losartan (COZAAR) 100 MG tablet Take 100 mg by mouth daily.      memantine (NAMENDA) 10 MG tablet Take 1 tablet (10 mg total) by mouth 2 (two) times daily. Start only after first finishing starter pack 60 tablet 3   metoprolol succinate (TOPROL-XL) 50 MG 24 hr tablet Take 50 mg by mouth at bedtime. Take with or immediately following a meal.     Multiple Vitamin (MULTIVITAMIN) tablet Take 1 tablet by mouth daily.     nitroGLYCERIN (NITROSTAT) 0.4 MG SL tablet Place 1 tablet (0.4 mg total) under the tongue every 5 (five) minutes x 3 doses as needed for chest pain.  25 tablet 3   psyllium (HYDROCIL/METAMUCIL) 95 % PACK Take 1  packet by mouth daily.     No current facility-administered medications on file prior to visit.    Allergies:   Allergies  Allergen Reactions   Lisinopril     Other reaction(s): Cough   Metformin Diarrhea    Physical Exam General: well developed, well nourished elderly African-American male, seated, in no evident distress Head: head normocephalic and atraumatic.   Neck: supple with no carotid or supraclavicular bruits Cardiovascular: regular rate and rhythm, no murmurs Musculoskeletal: no deformity Skin:  no rash/petichiae Vascular:  Normal pulses all extremities  Neurologic Exam Mental Status: Awake and fully alert. Oriented to place and time. Recent and remote memory diminished. Attention span, concentration and fund of knowledge poor. Mood and affect appropriate.  Mini-Mental status exam not completed today. (Last visit 13/30 with deficits in orientation, attention, calculation, recall and following three-step commands.)  Diminished recall 0/3.  Able to name only 0 animals which can walk on 4 legs.  Not cooperative for doing clock drawing or copying descending pentagons. Cranial Nerves: Fundoscopic exam not done pupils equal, briskly reactive to light. Extraocular movements full without nystagmus. Visual fields full to confrontation. Hearing diminished bilaterally facial sensation intact. Face, tongue, palate moves normally and symmetrically.  Motor: Normal bulk and tone. Normal strength in all tested extremity muscles.  Except wasting of both thenar eminences.  Mild weakness of abductor pollicis and extensor pollicis in the right hand. Sensory.:  diminished touch pinprick sensation in the right thumb and index finger only.  Positive weak Tinel sign on both wrists right greater than left.  Diminished vibration sensations over ankles bilaterally.  Romberg sign is weakly positive. Coordination: Rapid alternating movements normal in all extremities. Finger-to-nose and heel-to-shin  performed accurately bilaterally. Gait and Station: Arises from chair without difficulty. Stance is normal. Gait demonstrates normal stride length and balance . Able to heel, toe and tandem walk with great difficulty.  Reflexes: 1+ and symmetric in upper extremities and both knee and ankle jerks are absent.. Toes downgoing.       ASSESSMENT: 88 year old African-American male with bilateral hand right greater than left weakness secondary to severe end-stage carpal tunnel syndrome.  Cervical radiculopathy and underlying diabtic neuropathy felt to be less likely.  Progressive subacute cognitive and memory decline likely due to Alzheimer's .  He has not been able to tolerate Aricept due to side effects but seems somewhat doing better on Namenda    PLAN: I had a long discussion with the patient and his wife regarding his progressive memory and cognitive worsening from dementia which seems somewhat stable after starting Namenda.  Continue Namenda 10 mg twice daily which is tolerating well.  Continue 24-hour supervision at home and if his condition worsens he may need to look into moving into memory care unit or nursing home.  Recommend neck stretching exercises for his left-sided posterior neck pain which appears to be musculoskeletal in nature.  Return for follow-up in the future in 1 year or call earlier if necessary.  His carpal tunnel symptoms seem quite stable at the present time hence recommend conservative follow-up for that.  .Greater than 50% time during this 35-minute   visit was spent on counseling and coordination of care about his hand numbness and carpal tunnel as well as memory loss and diagnosis of Alzheimer's and answering questions.   Delia Heady, MD  [facility] [address] [place], [place] [address]  Phone [phone] Fax [phone] Note: This document was prepared with digital dictation and possible smart phrase technology. Any  transcriptional errors that result from this process are unintentional.

## 2022-07-05 NOTE — Patient Instructions (Addendum)
I had a long discussion with the patient and his wife regarding his progressive memory and cognitive worsening from dementia which seems somewhat stable after starting Namenda.  Continue Namenda 10 mg twice daily which is tolerating well.  Continue 24-hour supervision at home and if his condition worsens he may need to look into moving into memory care unit or nursing home.  Recommend neck stretching exercises for his left-sided posterior neck pain which appears to be musculoskeletal in nature.  Return for follow-up in the future in 1 year or call earlier if necessary.  Neck Exercises Ask your health care provider which exercises are safe for you. Do exercises exactly as told by your health care provider and adjust them as directed. It is normal to feel mild stretching, pulling, tightness, or discomfort as you do these exercises. Stop right away if you feel sudden pain or your pain gets worse. Do not begin these exercises until told by your health care provider. Neck exercises can be important for many reasons. They can improve strength and maintain flexibility in your neck, which will help your upper back and prevent neck pain. Stretching exercises Rotation neck stretching  Sit in a chair or stand up. Place your feet flat on the floor, shoulder-width apart. Slowly turn your head (rotate) to the right until a slight stretch is felt. Turn it all the way to the right so you can look over your right shoulder. Do not tilt or tip your head. Hold this position for 10-30 seconds. Slowly turn your head (rotate) to the left until a slight stretch is felt. Turn it all the way to the left so you can look over your left shoulder. Do not tilt or tip your head. Hold this position for 10-30 seconds. Repeat __________ times. Complete this exercise __________ times a day. Neck retraction  Sit in a sturdy chair or stand up. Look straight ahead. Do not bend your neck. Use your fingers to push your chin backward  (retraction). Do not bend your neck for this movement. Continue to face straight ahead. If you are doing the exercise properly, you will feel a slight sensation in your throat and a stretch at the back of your neck. Hold the stretch for 1-2 seconds. Repeat __________ times. Complete this exercise __________ times a day. Strengthening exercises Neck press  Lie on your back on a firm bed or on the floor with a pillow under your head. Use your neck muscles to push your head down on the pillow and straighten your spine. Hold the position as well as you can. Keep your head facing up (in a neutral position) and your chin tucked. Slowly count to 5 while holding this position. Repeat __________ times. Complete this exercise __________ times a day. Isometrics These are exercises in which you strengthen the muscles in your neck while keeping your neck still (isometrics). Sit in a supportive chair and place your hand on your forehead. Keep your head and face facing straight ahead. Do not flex or extend your neck while doing isometrics. Push forward with your head and neck while pushing back with your hand. Hold for 10 seconds. Do the sequence again, this time putting your hand against the back of your head. Use your head and neck to push backward against the hand pressure. Finally, do the same exercise on either side of your head, pushing sideways against the pressure of your hand. Repeat __________ times. Complete this exercise __________ times a day. Prone head lifts  Lorenz Coaster  face-down (prone position), resting on your elbows so that your chest and upper back are raised. Start with your head facing downward, near your chest. Position your chin either on or near your chest. Slowly lift your head upward. Lift until you are looking straight ahead. Then continue lifting your head as far back as you can comfortably stretch. Hold your head up for 5 seconds. Then slowly lower it to your starting  position. Repeat __________ times. Complete this exercise __________ times a day. Supine head lifts  Lie on your back (supine position), bending your knees to point to the ceiling and keeping your feet flat on the floor. Lift your head slowly off the floor, raising your chin toward your chest. Hold for 5 seconds. Repeat __________ times. Complete this exercise __________ times a day. Scapular retraction  Stand with your arms at your sides. Look straight ahead. Slowly pull both shoulders (scapulae) backward and downward (retraction) until you feel a stretch between your shoulder blades in your upper back. Hold for 10-30 seconds. Relax and repeat. Repeat __________ times. Complete this exercise __________ times a day. Contact a health care provider if: Your neck pain or discomfort gets worse when you do an exercise. Your neck pain or discomfort does not improve within 2 hours after you exercise. If you have any of these problems, stop exercising right away. Do not do the exercises again unless your health care provider says that you can. Get help right away if: You develop sudden, severe neck pain. If this happens, stop exercising right away. Do not do the exercises again unless your health care provider says that you can. This information is not intended to replace advice given to you by your health care provider. Make sure you discuss any questions you have with your health care provider. Document Revised: 12/22/2020 Document Reviewed: 12/22/2020 Elsevier Patient Education  [place].

## 2022-08-03 ENCOUNTER — Ambulatory Visit: Payer: Medicare Other | Admitting: Neurology

## 2022-09-21 ENCOUNTER — Ambulatory Visit: Payer: Medicare Other | Admitting: Podiatry

## 2022-09-21 DIAGNOSIS — E113293 Type 2 diabetes mellitus with mild nonproliferative diabetic retinopathy without macular edema, bilateral: Secondary | ICD-10-CM | POA: Diagnosis not present

## 2022-09-21 DIAGNOSIS — H524 Presbyopia: Secondary | ICD-10-CM | POA: Diagnosis not present

## 2022-09-21 DIAGNOSIS — H2513 Age-related nuclear cataract, bilateral: Secondary | ICD-10-CM | POA: Diagnosis not present

## 2022-09-21 DIAGNOSIS — H5203 Hypermetropia, bilateral: Secondary | ICD-10-CM | POA: Diagnosis not present

## 2022-09-21 LAB — HM DIABETES EYE EXAM

## 2022-09-27 ENCOUNTER — Encounter: Payer: Self-pay | Admitting: Podiatry

## 2022-09-27 ENCOUNTER — Ambulatory Visit: Payer: Medicare Other | Admitting: Podiatry

## 2022-09-27 VITALS — BP 140/66

## 2022-09-27 DIAGNOSIS — M2042 Other hammer toe(s) (acquired), left foot: Secondary | ICD-10-CM

## 2022-09-27 DIAGNOSIS — E119 Type 2 diabetes mellitus without complications: Secondary | ICD-10-CM | POA: Diagnosis not present

## 2022-09-27 DIAGNOSIS — E1159 Type 2 diabetes mellitus with other circulatory complications: Secondary | ICD-10-CM | POA: Diagnosis not present

## 2022-09-27 DIAGNOSIS — B351 Tinea unguium: Secondary | ICD-10-CM | POA: Diagnosis not present

## 2022-09-27 DIAGNOSIS — M2041 Other hammer toe(s) (acquired), right foot: Secondary | ICD-10-CM | POA: Diagnosis not present

## 2022-09-27 DIAGNOSIS — M21372 Foot drop, left foot: Secondary | ICD-10-CM | POA: Diagnosis not present

## 2022-09-27 DIAGNOSIS — M79674 Pain in right toe(s): Secondary | ICD-10-CM | POA: Diagnosis not present

## 2022-09-27 DIAGNOSIS — M79675 Pain in left toe(s): Secondary | ICD-10-CM

## 2022-09-27 NOTE — Progress Notes (Unsigned)
ANNUAL DIABETIC FOOT EXAM  Subjective: Alexander Donaldson presents today for annual diabetic foot examination.  Chief Complaint  Patient presents with   Nail Problem    [facility] BS-did not check A1C-7.2 PCP-Alexander Donaldson, Alexander Donaldson [facility]) PCP VST-08/2022  Patient has h/o dementia.  Patient's wife confirms h/o diabetes.  Patient's wife denies any h/o foot wounds.  Risk factors: {jgriskfactors:24044}.  Pleas Koch, NP is patient's PCP.  Past Medical History:  Diagnosis Date   Acute encephalopathy 02/12/2018   CAP (community acquired pneumonia) 02/11/2018   Carpal tunnel syndrome, bilateral 02/25/2020   Coronary artery disease    Diabetes mellitus    Diverticulitis    Hypertension    Hypokalemia 02/12/2018   Patient Active Problem List   Diagnosis Date Noted   Arthralgia 07/05/2022   Acute cough 04/19/2022   NSTEMI (non-ST elevated myocardial infarction) ([place])    Accelerated junctional rhythm 01/30/2022   Unspecified dementia, unspecified severity, without behavioral disturbance, psychotic disturbance, mood disturbance, and anxiety ([place]) 01/30/2022   Carpal tunnel syndrome, bilateral 02/25/2020   Numbness 11/28/2019   Raynaud's phenomenon 10/30/2019   CAD (coronary artery disease) 02/12/2018   Left foot drop 02/12/2018   Type 2 diabetes mellitus with vascular disease ([place]) 02/12/2018   Essential hypertension 02/12/2018   Hypokalemia 02/12/2018   Past Surgical History:  Procedure Laterality Date   ANGIOPLASTY     CORONARY STENT INTERVENTION N/A 02/02/2017   Procedure: Coronary Stent Intervention;  Surgeon: Charolette Forward, MD;  Location: [place] [facility];  Service: Cardiovascular;  Laterality: N/A;   CORONARY STENTS     LEFT HEART CATH AND CORONARY ANGIOGRAPHY N/A 02/02/2017   Procedure: Left Heart Cath and Coronary Angiography;  Surgeon: Charolette Forward, MD;  Location: [place] [facility];  Service: Cardiovascular;  Laterality: N/A;   LEFT HEART CATHETERIZATION  WITH CORONARY ANGIOGRAM N/A 03/16/2012   Procedure: LEFT HEART CATHETERIZATION WITH CORONARY ANGIOGRAM;  Surgeon: Clent Demark, MD;  Location: [place] CATH LAB;  Service: Cardiovascular;  Laterality: N/A;   PERCUTANEOUS CORONARY STENT INTERVENTION (PCI-S)  03/16/2012   Procedure: PERCUTANEOUS CORONARY STENT INTERVENTION (PCI-S);  Surgeon: Clent Demark, MD;  Location: [facility] CATH LAB;  Service: Cardiovascular;;   TONSILLECTOMY     Current Outpatient Medications on File Prior to Visit  Medication Sig Dispense Refill   acetaminophen (TYLENOL) 500 MG tablet Take 500 mg by mouth every 6 (six) hours as needed for moderate pain.     Alogliptin Benzoate 12.5 MG TABS Take 12.5 mg by mouth daily.     amLODipine (NORVASC) 10 MG tablet Take 10 mg by mouth every evening.     aspirin 81 MG tablet Take 81 mg by mouth daily.     atorvastatin (LIPITOR) 80 MG tablet Take 40 mg by mouth every evening.     clopidogrel (PLAVIX) 75 MG tablet Take 75 mg by mouth daily.  3   diclofenac sodium (VOLTAREN) 1 % GEL Apply 2 g topically 4 (four) times daily as needed (pain). 100 g 0   losartan (COZAAR) 100 MG tablet Take 100 mg by mouth daily.      memantine (NAMENDA) 10 MG tablet Take 1 tablet (10 mg total) by mouth 2 (two) times daily. Start only after first finishing starter pack 60 tablet 3   metoprolol succinate (TOPROL-XL) 50 MG 24 hr tablet Take 50 mg by mouth at bedtime. Take with or immediately following a meal.     Multiple Vitamin (MULTIVITAMIN) tablet Take 1 tablet by mouth  daily.     nitroGLYCERIN (NITROSTAT) 0.4 MG SL tablet Place 1 tablet (0.4 mg total) under the tongue every 5 (five) minutes x 3 doses as needed for chest pain. 25 tablet 3   psyllium (HYDROCIL/METAMUCIL) 95 % PACK Take 1 packet by mouth daily.     No current facility-administered medications on file prior to visit.    Allergies  Allergen Reactions   Lisinopril     Other reaction(s): Cough   Metformin Diarrhea   Social History    Occupational History   Not on file  Tobacco Use   Smoking status: Never   Smokeless tobacco: Never  Vaping Use   Vaping Use: Never used  Substance and Sexual Activity   Alcohol use: No    Comment: social   Drug use: No   Sexual activity: Yes    Birth control/protection: None   History reviewed. No pertinent family history. Immunization History  Administered Date(s) Administered   Influenza, Seasonal, Injecte, Preservative Fre 05/11/2011, 04/27/2012, 05/20/2013, 07/22/2014   Influenza,inj,Quad PF,6+ Mos 04/18/2019, 06/17/2020   Influenza,trivalent, recombinat, inj, PF 05/08/2017, 05/11/2018   Influenza-Unspecified 06/03/2015, 05/11/2016, 04/11/2019   PFIZER Comirnaty(Gray Top)Covid-19 Tri-Sucrose Vaccine 01/12/2021   PFIZER(Purple Top)SARS-COV-2 Vaccination 08/17/2019, 09/11/2019, 04/25/2020, 10/20/2021   Pfizer Covid-19 Vaccine Bivalent Booster 9yrs & up 10/20/2021   Pneumococcal Conjugate-13 02/20/2014   Pneumococcal Polysaccharide-23 04/27/2012   Tdap 11/23/2011   Zoster Recombinat (Shingrix) 12/23/2019, 05/25/2020     Review of Systems: Negative except as noted in the HPI.   Objective: Vitals:   09/27/22 1146  BP: (!) 140/66    Alexander Donaldson is a pleasant 89 y.o. male in NAD. AAO X 3.  Vascular Examination: {jgvascular:23595}  Dermatological Examination: {jgderm:23598}  Neurological Examination: {jgneuro:23601::"Protective sensation intact 5/5 intact bilaterally with 10g monofilament b/l.","Vibratory sensation intact b/l.","Proprioception intact bilaterally."}  Musculoskeletal Examination: {jgmsk:23600}  Footwear Assessment: Does the patient wear appropriate shoes? {Yes,No}. Does the patient need inserts/orthotics? {Yes,No}.  Lab Results  Component Value Date   HGBA1C 7.2 (H) 01/30/2022   No results found. ADA Risk Categorization: Low Risk :  Patient has all of the following: Intact protective sensation No prior foot ulcer  No severe  deformity Pedal pulses present  High Risk  Patient has one or more of the following: Loss of protective sensation Absent pedal pulses Severe Foot deformity History of foot ulcer  Assessment: 1. Pain due to onychomycosis of toenails of both feet   2. Left foot drop   3. Type 2 diabetes mellitus with vascular disease ([place])   4. Encounter for diabetic foot exam ([place])      Plan: No orders of the defined types were placed in this encounter.   No orders of the defined types were placed in this encounter.   None  {jgplan:23602::"-Patient/POA to call should there be question/concern in the interim."} Return in about 3 months (around 12/28/2022).  Marzetta Board, DPM

## 2022-11-28 NOTE — Telephone Encounter (Signed)
Called and spoke with patients wife, she stated that in August 2019 patient was in the hospital with pneumonia and he was sent home with a 5 year supply of O2. Patients wife stated the patient never used this O2. They were contacted from [employer] because this order is up for renewal, however they would like this to be discontinued as patient does not and has not needed any supplemental O2.   Order is placed in your inbox please write on it that we are discontinuing services for O2 (per patient request)

## 2022-11-29 NOTE — Telephone Encounter (Signed)
Form completed and placed in Kelli's inbox 

## 2022-11-30 NOTE — Telephone Encounter (Signed)
[employer] called back regarding updated form being faxed to them.She stated that they never received the form if it was already faxed back. Fax number: [phone]

## 2022-11-30 NOTE — Telephone Encounter (Signed)
Form has been faxed to [employer].

## 2022-12-10 ENCOUNTER — Emergency Department (HOSPITAL_COMMUNITY)
Admission: EM | Admit: 2022-12-10 | Discharge: 2022-12-10 | Disposition: A | Discharge status: Home / Self Care | Payer: No Typology Code available for payment source | Attending: Emergency Medicine | Admitting: Emergency Medicine

## 2022-12-10 ENCOUNTER — Emergency Department (HOSPITAL_COMMUNITY): Payer: No Typology Code available for payment source

## 2022-12-10 ENCOUNTER — Other Ambulatory Visit: Payer: Self-pay

## 2022-12-10 ENCOUNTER — Encounter (HOSPITAL_COMMUNITY): Payer: Self-pay

## 2022-12-10 DIAGNOSIS — F0280 Dementia in other diseases classified elsewhere without behavioral disturbance: Secondary | ICD-10-CM | POA: Diagnosis not present

## 2022-12-10 DIAGNOSIS — E119 Type 2 diabetes mellitus without complications: Secondary | ICD-10-CM | POA: Insufficient documentation

## 2022-12-10 DIAGNOSIS — Z79899 Other long term (current) drug therapy: Secondary | ICD-10-CM | POA: Diagnosis not present

## 2022-12-10 DIAGNOSIS — R072 Precordial pain: Secondary | ICD-10-CM

## 2022-12-10 DIAGNOSIS — G309 Alzheimer's disease, unspecified: Secondary | ICD-10-CM | POA: Insufficient documentation

## 2022-12-10 DIAGNOSIS — I2510 Atherosclerotic heart disease of native coronary artery without angina pectoris: Secondary | ICD-10-CM | POA: Insufficient documentation

## 2022-12-10 DIAGNOSIS — Z7902 Long term (current) use of antithrombotics/antiplatelets: Secondary | ICD-10-CM | POA: Insufficient documentation

## 2022-12-10 DIAGNOSIS — I10 Essential (primary) hypertension: Secondary | ICD-10-CM | POA: Diagnosis not present

## 2022-12-10 DIAGNOSIS — Z7982 Long term (current) use of aspirin: Secondary | ICD-10-CM | POA: Insufficient documentation

## 2022-12-10 HISTORY — DX: Unspecified hearing loss, unspecified ear: H91.90

## 2022-12-10 HISTORY — DX: Atherosclerotic heart disease of native coronary artery without angina pectoris: I25.10

## 2022-12-10 HISTORY — DX: Dementia in other diseases classified elsewhere, unspecified severity, without behavioral disturbance, psychotic disturbance, mood disturbance, and anxiety: F02.80

## 2022-12-10 HISTORY — DX: Non-ST elevation (NSTEMI) myocardial infarction: I21.4

## 2022-12-10 LAB — CBC WITH DIFFERENTIAL/PLATELET
Abs Immature Granulocytes: 0.01 10*3/uL (ref 0.00–0.07)
Basophils Absolute: 0 10*3/uL (ref 0.0–0.1)
Basophils Relative: 1 %
Eosinophils Absolute: 0.3 10*3/uL (ref 0.0–0.5)
Eosinophils Relative: 4 %
HCT: 40.2 % (ref 39.0–52.0)
Hemoglobin: 14.3 g/dL (ref 13.0–17.0)
Immature Granulocytes: 0 %
Lymphocytes Relative: 39 %
Lymphs Abs: 2.9 10*3/uL (ref 0.7–4.0)
MCH: 32.2 pg (ref 26.0–34.0)
MCHC: 35.6 g/dL (ref 30.0–36.0)
MCV: 90.5 fL (ref 80.0–100.0)
Monocytes Absolute: 0.6 10*3/uL (ref 0.1–1.0)
Monocytes Relative: 8 %
Neutro Abs: 3.7 10*3/uL (ref 1.7–7.7)
Neutrophils Relative %: 48 %
Platelets: 176 10*3/uL (ref 150–400)
RBC: 4.44 MIL/uL (ref 4.22–5.81)
RDW: 12.6 % (ref 11.5–15.5)
WBC: 7.5 10*3/uL (ref 4.0–10.5)
nRBC: 0 % (ref 0.0–0.2)

## 2022-12-10 LAB — BASIC METABOLIC PANEL
Anion gap: 10 (ref 5–15)
BUN: 22 mg/dL (ref 8–23)
CO2: 25 mmol/L (ref 22–32)
Calcium: 8.7 mg/dL — ABNORMAL LOW (ref 8.9–10.3)
Chloride: 99 mmol/L (ref 98–111)
Creatinine, Ser: 1.15 mg/dL (ref 0.61–1.24)
GFR, Estimated: >60 mL/min (ref >60)
Glucose, Bld: 165 mg/dL — ABNORMAL HIGH (ref 70–99)
Potassium: 3.2 mmol/L — ABNORMAL LOW (ref 3.5–5.1)
Sodium: 134 mmol/L — ABNORMAL LOW (ref 135–145)

## 2022-12-10 LAB — TROPONIN I (HIGH SENSITIVITY)
Troponin I (High Sensitivity): 14 ng/L (ref <18)
Troponin I (High Sensitivity): 15 ng/L (ref <18)

## 2022-12-10 MED ORDER — ALUM & MAG HYDROXIDE-SIMETH 200-200-20 MG/5ML PO SUSP
30.0000 mL | Freq: Once | ORAL | Status: AC
  Administered 2022-12-10: 30 mL via ORAL
  Filled 2022-12-10: qty 30

## 2022-12-10 MED ORDER — IOHEXOL 350 MG/ML SOLN
75.0000 mL | Freq: Once | INTRAVENOUS | Status: AC | PRN
  Administered 2022-12-10: 75 mL via INTRAVENOUS

## 2022-12-10 NOTE — ED Provider Notes (Signed)
[place] EMERGENCY DEPARTMENT AT [facility] Provider Note   CSN: [id number] Arrival date & time: 12/10/22  4098     History  Chief Complaint  Patient presents with   Chest Pain    Alexander Donaldson is a 89 y.o. male.  The history is provided by the patient and the EMS personnel. The history is limited by the condition of the patient (level 5 caveat dementia).  Chest Pain Pain location:  Epigastric Pain radiates to:  Does not radiate Pain severity:  Moderate Onset quality:  Sudden Timing:  Constant Context: at rest   Relieved by:  Nothing Worsened by:  Nothing Ineffective treatments:  None tried Associated symptoms: no diaphoresis, no fever and no vomiting   Risk factors: male sex   Pleasant patient with CAD and Alzheimer's dementia who presents with epigastric pain from home.  Patient reportedly woke his wife from sleep and stated he had pain and his wife called EMS.  Patient is currently pain free at this time and is denying all complaints.      Past Medical History:  Diagnosis Date   Acute encephalopathy 02/12/2018   Alzheimer's dementia (HCC)    CAD (coronary artery disease)    CAP (community acquired pneumonia) 02/11/2018   Carpal tunnel syndrome, bilateral 02/25/2020   Coronary artery disease    Diabetes mellitus    Diverticulitis    Hearing loss    Hypertension    Hypokalemia 02/12/2018   NSTEMI (non-ST elevated myocardial infarction) (HCC)      Home Medications Prior to Admission medications   Medication Sig Start Date End Date Taking? Authorizing Provider  acetaminophen (TYLENOL) 500 MG tablet Take 500 mg by mouth every 6 (six) hours as needed for moderate pain.    [provider]  Alogliptin Benzoate 12.5 MG TABS Take 12.5 mg by mouth daily. 03/30/21   [provider]  amLODipine (NORVASC) 10 MG tablet Take 10 mg by mouth every evening.    [provider]  aspirin 81 MG tablet Take 81 mg by mouth daily.    [provider]  atorvastatin (LIPITOR) 80 MG tablet Take 40 mg by mouth every evening.    [provider]  clopidogrel (PLAVIX) 75 MG tablet Take 75 mg by mouth daily. 01/02/17   [provider]  diclofenac sodium (VOLTAREN) 1 % GEL Apply 2 g topically 4 (four) times daily as needed (pain). 11/01/18   Melene Plan, DO  losartan (COZAAR) 100 MG tablet Take 100 mg by mouth daily.     [provider]  memantine (NAMENDA) 10 MG tablet Take 1 tablet (10 mg total) by mouth 2 (two) times daily. Start only after first finishing starter pack 05/24/21   Micki Riley, MD  metoprolol succinate (TOPROL-XL) 50 MG 24 hr tablet Take 50 mg by mouth at bedtime. Take with or immediately following a meal.    [provider]  Multiple Vitamin (MULTIVITAMIN) tablet Take 1 tablet by mouth daily.    [provider]  nitroGLYCERIN (NITROSTAT) 0.4 MG SL tablet Place 1 tablet (0.4 mg total) under the tongue every 5 (five) minutes x 3 doses as needed for chest pain. 03/18/12   Rinaldo Cloud, MD  psyllium (HYDROCIL/METAMUCIL) 95 % PACK Take 1 packet by mouth daily.    [provider]      Allergies    Lisinopril and Metformin    Review of Systems   Review of Systems  Unable to perform ROS:  Dementia  Constitutional:  Negative for diaphoresis and fever.  HENT:  Negative for facial swelling.   Eyes:  Negative for redness.  Respiratory:  Negative for wheezing and stridor.   Cardiovascular:  Positive for chest pain. Negative for leg swelling.  Gastrointestinal:  Negative for vomiting.    Physical Exam Updated Vital Signs BP 130/75   Pulse (!) 58   Temp 98.1 F (36.7 C) (Oral)   Resp 20   Ht 5\' 4"  (1.626 m)   Wt 73.5 kg   SpO2 100%   BMI 27.81 kg/m  Physical Exam Vitals and nursing note reviewed. Exam conducted with a chaperone present.  Constitutional:      General: He is not in acute distress.    Appearance: He is well-developed. He is not diaphoretic.   HENT:     Head: Normocephalic and atraumatic.     Nose: Nose normal.     Mouth/Throat:     Mouth: Mucous membranes are moist.  Eyes:     Conjunctiva/sclera: Conjunctivae normal.     Pupils: Pupils are equal, round, and reactive to light.  Cardiovascular:     Rate and Rhythm: Normal rate and regular rhythm.     Pulses: Normal pulses.     Heart sounds: Normal heart sounds.  Pulmonary:     Effort: Pulmonary effort is normal.     Breath sounds: Normal breath sounds. No wheezing or rales.  Abdominal:     General: Bowel sounds are normal. There is no distension.     Palpations: Abdomen is soft.     Tenderness: There is no abdominal tenderness. There is no guarding or rebound.  Musculoskeletal:        General: Normal range of motion.     Cervical back: Normal range of motion and neck supple.  Skin:    General: Skin is warm and dry.     Capillary Refill: Capillary refill takes less than 2 seconds.  Neurological:     General: No focal deficit present.     Mental Status: He is alert and oriented to person, place, and time.     Deep Tendon Reflexes: Reflexes normal.  Psychiatric:        Mood and Affect: Mood normal.     ED Results / Procedures / Treatments   Labs (all labs ordered are listed, but only abnormal results are displayed) Results for orders placed or performed during the hospital encounter of 12/10/22  CBC with Differential  Result Value Ref Range   WBC 7.5 4.0 - 10.5 K/uL   RBC 4.44 4.22 - 5.81 MIL/uL   Hemoglobin 14.3 13.0 - 17.0 g/dL   HCT 16.1 09.6 - 04.5 %   MCV 90.5 80.0 - 100.0 fL   MCH 32.2 26.0 - 34.0 pg   MCHC 35.6 30.0 - 36.0 g/dL   RDW 40.9 81.1 - 91.4 %   Platelets 176 150 - 400 K/uL   nRBC 0.0 0.0 - 0.2 %   Neutrophils Relative % 48 %   Neutro Abs 3.7 1.7 - 7.7 K/uL   Lymphocytes Relative 39 %   Lymphs Abs 2.9 0.7 - 4.0 K/uL   Monocytes Relative 8 %   Monocytes Absolute 0.6 0.1 - 1.0 K/uL   Eosinophils Relative 4 %   Eosinophils Absolute 0.3  0.0 - 0.5 K/uL   Basophils Relative 1 %   Basophils Absolute 0.0 0.0 - 0.1 K/uL   Immature Granulocytes 0 %   Abs Immature Granulocytes 0.01 0.00 -  0.07 K/uL  Basic metabolic panel  Result Value Ref Range   Sodium 134 (L) 135 - 145 mmol/L   Potassium 3.2 (L) 3.5 - 5.1 mmol/L   Chloride 99 98 - 111 mmol/L   CO2 25 22 - 32 mmol/L   Glucose, Bld 165 (H) 70 - 99 mg/dL   BUN 22 8 - 23 mg/dL   Creatinine, Ser 0.98 0.61 - 1.24 mg/dL   Calcium 8.7 (L) 8.9 - 10.3 mg/dL   GFR, Estimated >11 >91 mL/min   Anion gap 10 5 - 15  Troponin I (High Sensitivity)  Result Value Ref Range   Troponin I (High Sensitivity) 14 <18 ng/L  Troponin I (High Sensitivity)  Result Value Ref Range   Troponin I (High Sensitivity) 15 <18 ng/L   CT Angio Chest PE W and/or Wo Contrast  Result Date: 12/10/2022 CLINICAL DATA:  89 year old male with chest pain, syncope, atrial fibrillation. EXAM: CT ANGIOGRAPHY CHEST WITH CONTRAST TECHNIQUE: Multidetector CT imaging of the chest was performed using the standard protocol during bolus administration of intravenous contrast. Multiplanar CT image reconstructions and MIPs were obtained to evaluate the vascular anatomy. RADIATION DOSE REDUCTION: This exam was performed according to the departmental dose-optimization program which includes automated exposure control, adjustment of the mA and/or kV according to patient size and/or use of iterative reconstruction technique. CONTRAST:  75mL OMNIPAQUE IOHEXOL 350 MG/ML SOLN COMPARISON:  Portable chest 0334 hours today. CT Abdomen and Pelvis 01/10/2019. FINDINGS: Cardiovascular: Excellent contrast bolus timing in the pulmonary arterial tree. Generally mild respiratory motion but more pronounced in the lower lobes, costophrenic angles. No pulmonary artery filling defect. Extensive calcified coronary artery plaque and/or stents. Calcified aortic atherosclerosis. Minimal aortic contrast. No pericardial effusion. Heart size at the upper limits of  normal. Mediastinum/Nodes: Negative. No mediastinal mass or lymphadenopathy. Lungs/Pleura: Chronic calcified pleural plaques in the left lower lung, corresponding to the retrocardiac density on the earlier radiograph. Saber sheath trachea. Major airways remain patent. Somewhat low lung volumes with symmetric lower lobe ground-glass opacity which more resembles atelectasis or scarring. No pleural effusion. No convincing pulmonary inflammation. No suspicious lung nodule. Upper Abdomen: Partially visible chronic cholelithiasis, nephrolithiasis. Chronic, exophytic but simple fluid density and benign left renal upper pole cyst (no follow-up imaging recommended). Negative visible essentially noncontrast liver, spleen, pancreas and adrenal glands. Diverticulosis of large bowel in the upper abdomen. Musculoskeletal: Chronic severe lower thoracic and thoracolumbar spine degeneration including several levels of interbody ankylosis which could be degenerative, postinflammatory, posttraumatic. No significant change from that visible on the 2020 CT Abdomen and Pelvis. Associated vertebral sclerosis at those levels. Vacuum disc elsewhere. Advanced degeneration also about the right shoulder. Partially visible chronic left 11th rib fracture. No acute osseous abnormality identified. Review of the MIP images confirms the above findings. IMPRESSION: 1. Negative for acute pulmonary embolus. 2. Chronic calcified pleural disease in the left lower lung, corresponding to the retrocardiac density on the earlier radiograph. Otherwise symmetric lower lobe ground-glass opacity most resembles atelectasis or scarring. 3. Extensive calcified coronary artery plaque and/or stents. Aortic Atherosclerosis (ICD10-I70.0). 4. Cholelithiasis and nephrolithiasis. And chronic severe thoracolumbar spine degeneration with some chronically ankylosed levels. Electronically Signed   By: Odessa Fleming M.D.   On: 12/10/2022 04:48   DG Chest Portable 1 View  Result  Date: 12/10/2022 CLINICAL DATA:  Chest pain. Atrial fibrillation. EXAM: PORTABLE CHEST 1 VIEW COMPARISON:  Chest radiograph 04/19/2022 FINDINGS: Low lung volumes. Patchy retrocardiac left lung base opacity. The heart size is normal for  technique. No pulmonary edema, pleural effusion or pneumothorax. Chronic shoulder arthropathy. IMPRESSION: Patchy retrocardiac left lung base opacity, may represent atelectasis or pneumonia. Low lung volumes. Electronically Signed   By: Narda Rutherford M.D.   On: 12/10/2022 03:48     EKG EKG Interpretation  Date/Time:  Saturday December 10 2022 03:24:22 EDT Ventricular Rate:  75 PR Interval:  157 QRS Duration: 104 QT Interval:  383 QTC Calculation: 428 R Axis:   -20 Text Interpretation: Sinus rhythm Ventricular premature complex Confirmed by Saketh Daubert ([id number]) on 12/10/2022 3:29:51 AM  Radiology CT Angio Chest PE W and/or Wo Contrast  Result Date: 12/10/2022 CLINICAL DATA:  89 year old male with chest pain, syncope, atrial fibrillation. EXAM: CT ANGIOGRAPHY CHEST WITH CONTRAST TECHNIQUE: Multidetector CT imaging of the chest was performed using the standard protocol during bolus administration of intravenous contrast. Multiplanar CT image reconstructions and MIPs were obtained to evaluate the vascular anatomy. RADIATION DOSE REDUCTION: This exam was performed according to the departmental dose-optimization program which includes automated exposure control, adjustment of the mA and/or kV according to patient size and/or use of iterative reconstruction technique. CONTRAST:  75mL OMNIPAQUE IOHEXOL 350 MG/ML SOLN COMPARISON:  Portable chest 0334 hours today. CT Abdomen and Pelvis 01/10/2019. FINDINGS: Cardiovascular: Excellent contrast bolus timing in the pulmonary arterial tree. Generally mild respiratory motion but more pronounced in the lower lobes, costophrenic angles. No pulmonary artery filling defect. Extensive calcified coronary artery plaque and/or stents.  Calcified aortic atherosclerosis. Minimal aortic contrast. No pericardial effusion. Heart size at the upper limits of normal. Mediastinum/Nodes: Negative. No mediastinal mass or lymphadenopathy. Lungs/Pleura: Chronic calcified pleural plaques in the left lower lung, corresponding to the retrocardiac density on the earlier radiograph. Saber sheath trachea. Major airways remain patent. Somewhat low lung volumes with symmetric lower lobe ground-glass opacity which more resembles atelectasis or scarring. No pleural effusion. No convincing pulmonary inflammation. No suspicious lung nodule. Upper Abdomen: Partially visible chronic cholelithiasis, nephrolithiasis. Chronic, exophytic but simple fluid density and benign left renal upper pole cyst (no follow-up imaging recommended). Negative visible essentially noncontrast liver, spleen, pancreas and adrenal glands. Diverticulosis of large bowel in the upper abdomen. Musculoskeletal: Chronic severe lower thoracic and thoracolumbar spine degeneration including several levels of interbody ankylosis which could be degenerative, postinflammatory, posttraumatic. No significant change from that visible on the 2020 CT Abdomen and Pelvis. Associated vertebral sclerosis at those levels. Vacuum disc elsewhere. Advanced degeneration also about the right shoulder. Partially visible chronic left 11th rib fracture. No acute osseous abnormality identified. Review of the MIP images confirms the above findings. IMPRESSION: 1. Negative for acute pulmonary embolus. 2. Chronic calcified pleural disease in the left lower lung, corresponding to the retrocardiac density on the earlier radiograph. Otherwise symmetric lower lobe ground-glass opacity most resembles atelectasis or scarring. 3. Extensive calcified coronary artery plaque and/or stents. Aortic Atherosclerosis (ICD10-I70.0). 4. Cholelithiasis and nephrolithiasis. And chronic severe thoracolumbar spine degeneration with some chronically  ankylosed levels. Electronically Signed   By: Odessa Fleming M.D.   On: 12/10/2022 04:48   DG Chest Portable 1 View  Result Date: 12/10/2022 CLINICAL DATA:  Chest pain. Atrial fibrillation. EXAM: PORTABLE CHEST 1 VIEW COMPARISON:  Chest radiograph 04/19/2022 FINDINGS: Low lung volumes. Patchy retrocardiac left lung base opacity. The heart size is normal for technique. No pulmonary edema, pleural effusion or pneumothorax. Chronic shoulder arthropathy. IMPRESSION: Patchy retrocardiac left lung base opacity, may represent atelectasis or pneumonia. Low lung volumes. Electronically Signed   By: Narda Rutherford M.D.   On: 12/10/2022  03:48    Procedures Procedures    Medications Ordered in ED Medications  iohexol (OMNIPAQUE) 350 MG/ML injection 75 mL (75 mLs Intravenous Contrast Given 12/10/22 0438)  alum & mag hydroxide-simeth (MAALOX/MYLANTA) 200-200-20 MG/5ML suspension 30 mL (30 mLs Oral Given 12/10/22 0456)    ED Course/ Medical Decision Making/ A&P                             Medical Decision Making Patient with epigastric pain that awoke the patient from sleep tonight   Amount and/or Complexity of Data Reviewed Independent Historian: EMS    Details: See above  External Data Reviewed: notes.    Details: Previous notes reviewed  Labs: ordered.    Details: 2 negative troponins 14/15.  Normal white count 7.5, normal hemoglobin 14.3 Normal platelet count.  Normal sodium 134, potassium 3.2 slight low, creatinine normal 1.15 Radiology: ordered and independent interpretation performed.    Details: Negative CTA by me  ECG/medicine tests: ordered and independent interpretation performed. Decision-making details documented in ED Course.  Risk OTC drugs. Prescription drug management. Risk Details: No complaints in the ED, well appearing. Very pleasant gentleman in no distress.  Patient has Ruled out for MI and PE in the ED, no pneumonia.  Exam and vitals are benign and reassuring.  I suspect this is  GERD.  Stable for discharge with close follow up.  Strict return.      Final Clinical Impression(s) / ED Diagnoses Final diagnoses:  Precordial pain   Return for intractable cough, coughing up blood, fevers > 100.4 unrelieved by medication, shortness of breath, intractable vomiting, chest pain, shortness of breath, weakness, numbness, changes in speech, facial asymmetry, abdominal pain, passing out, Inability to tolerate liquids or food, cough, altered mental status or any concerns. No signs of systemic illness or infection. The patient is nontoxic-appearing on exam and vital signs are within normal limits.  I have reviewed the triage vital signs and the nursing notes. Pertinent labs & imaging results that were available during my care of the patient were reviewed by me and considered in my medical decision making (see chart for details). After history, exam, and medical workup I feel the patient has been appropriately medically screened and is safe for discharge home. Pertinent diagnoses were discussed with the patient. Patient was given return precautions Rx / DC Orders ED Discharge Orders     None         Riaz Onorato, MD 12/10/22 [phone]

## 2022-12-10 NOTE — ED Notes (Signed)
Update given to pt and wife, currently waiting on 2nd trop to results, pt reports is comfortable, no pain at current.

## 2022-12-10 NOTE — ED Notes (Signed)
Dr. Nicanor Alcon at bedside to review results and AVS with pt and wife

## 2022-12-10 NOTE — ED Notes (Signed)
Wife concern for bleeding at IV site, upon assessment, IV not bleeding or leaking, IV patent, no swelling or abnormities noted. This RN cleansed dried blood from EMS IV start, new taping applied, IV secured.

## 2022-12-10 NOTE — ED Triage Notes (Signed)
Pt arrived from home c/o CP for 1 hr, told wife it started as epigastric pain, BIB [facility]. VSS, A&O at baseline. Afib on cardiac monitor per EMS. Pt denies pain at current.   Hx of Afib , NSTEMI, 2 heart caths, & advance alzheimer.   324 ASA mg PTA

## 2022-12-15 ENCOUNTER — Telehealth: Payer: Self-pay

## 2022-12-15 NOTE — Telephone Encounter (Signed)
Transition Care Management Follow-up Telephone Call Date of discharge and from where: [facility] 6/1 How have you been since you were released from the hospital? Doing fine  Any questions or concerns? No  Items Reviewed: Did the pt receive and understand the discharge instructions provided? Yes  Medications obtained and verified? Yes  Other? No  Any new allergies since your discharge? No  Dietary orders reviewed? No Do you have support at home? Yes    Follow up appointments reviewed:  PCP Hospital f/u appt confirmed? Yes  Scheduled to see PCP 6/10 on  @ . Specialist Hospital f/u appt confirmed? Yes  Scheduled to see on  @ . Are transportation arrangements needed? No  If their condition worsens, is the pt aware to call PCP or go to the Emergency Dept.? Yes Was the patient provided with contact information for the PCP's office or ED? Yes Was to pt encouraged to call back with questions or concerns? Yes

## 2022-12-15 NOTE — Telephone Encounter (Signed)
Transition Care Management Unsuccessful Follow-up Telephone Call  Date of discharge and from where:  [facility] 6/1  Attempts:  1st Attempt  Reason for unsuccessful TCM follow-up call:  Left voice message   [facility], [place] [phone] [address], [place], [place] [address] Phone: [phone] Email: [email or web address]       

## 2022-12-26 DIAGNOSIS — I2510 Atherosclerotic heart disease of native coronary artery without angina pectoris: Secondary | ICD-10-CM | POA: Diagnosis not present

## 2022-12-26 DIAGNOSIS — I10 Essential (primary) hypertension: Secondary | ICD-10-CM | POA: Diagnosis not present

## 2022-12-26 DIAGNOSIS — Z8679 Personal history of other diseases of the circulatory system: Secondary | ICD-10-CM | POA: Diagnosis not present

## 2022-12-26 DIAGNOSIS — I255 Ischemic cardiomyopathy: Secondary | ICD-10-CM | POA: Diagnosis not present

## 2023-01-19 ENCOUNTER — Other Ambulatory Visit: Payer: Self-pay

## 2023-01-19 ENCOUNTER — Emergency Department (HOSPITAL_COMMUNITY): Payer: Non-veteran care

## 2023-01-19 ENCOUNTER — Emergency Department (HOSPITAL_COMMUNITY)
Admission: EM | Admit: 2023-01-19 | Discharge: 2023-01-19 | Disposition: A | Discharge status: Home / Self Care | Payer: Non-veteran care | Attending: Emergency Medicine | Admitting: Emergency Medicine

## 2023-01-19 ENCOUNTER — Encounter (HOSPITAL_COMMUNITY): Payer: Self-pay | Admitting: Emergency Medicine

## 2023-01-19 DIAGNOSIS — R531 Weakness: Secondary | ICD-10-CM | POA: Diagnosis present

## 2023-01-19 DIAGNOSIS — I2510 Atherosclerotic heart disease of native coronary artery without angina pectoris: Secondary | ICD-10-CM | POA: Insufficient documentation

## 2023-01-19 DIAGNOSIS — Z7984 Long term (current) use of oral hypoglycemic drugs: Secondary | ICD-10-CM | POA: Diagnosis not present

## 2023-01-19 DIAGNOSIS — F0390 Unspecified dementia without behavioral disturbance: Secondary | ICD-10-CM | POA: Insufficient documentation

## 2023-01-19 DIAGNOSIS — E119 Type 2 diabetes mellitus without complications: Secondary | ICD-10-CM | POA: Insufficient documentation

## 2023-01-19 DIAGNOSIS — Z79899 Other long term (current) drug therapy: Secondary | ICD-10-CM | POA: Diagnosis not present

## 2023-01-19 DIAGNOSIS — Z7902 Long term (current) use of antithrombotics/antiplatelets: Secondary | ICD-10-CM | POA: Diagnosis not present

## 2023-01-19 DIAGNOSIS — Z7982 Long term (current) use of aspirin: Secondary | ICD-10-CM | POA: Insufficient documentation

## 2023-01-19 DIAGNOSIS — I10 Essential (primary) hypertension: Secondary | ICD-10-CM | POA: Diagnosis not present

## 2023-01-19 LAB — CBC
HCT: 40.4 % (ref 39.0–52.0)
Hemoglobin: 14 g/dL (ref 13.0–17.0)
MCH: 31.5 pg (ref 26.0–34.0)
MCHC: 34.7 g/dL (ref 30.0–36.0)
MCV: 91 fL (ref 80.0–100.0)
Platelets: 166 10*3/uL (ref 150–400)
RBC: 4.44 MIL/uL (ref 4.22–5.81)
RDW: 12.5 % (ref 11.5–15.5)
WBC: 6.1 10*3/uL (ref 4.0–10.5)
nRBC: 0 % (ref 0.0–0.2)

## 2023-01-19 LAB — URINALYSIS, W/ REFLEX TO CULTURE (INFECTION SUSPECTED)
Bacteria, UA: NONE SEEN
Bilirubin Urine: NEGATIVE
Glucose, UA: 150 mg/dL — AB
Hgb urine dipstick: NEGATIVE
Ketones, ur: NEGATIVE mg/dL
Leukocytes,Ua: NEGATIVE
Nitrite: NEGATIVE
Protein, ur: NEGATIVE mg/dL
Specific Gravity, Urine: 1.011 (ref 1.005–1.030)
pH: 7 (ref 5.0–8.0)

## 2023-01-19 LAB — BASIC METABOLIC PANEL
Anion gap: 8 (ref 5–15)
BUN: 20 mg/dL (ref 8–23)
CO2: 26 mmol/L (ref 22–32)
Calcium: 9.1 mg/dL (ref 8.9–10.3)
Chloride: 102 mmol/L (ref 98–111)
Creatinine, Ser: 1.1 mg/dL (ref 0.61–1.24)
GFR, Estimated: >60 mL/min (ref >60)
Glucose, Bld: 212 mg/dL — ABNORMAL HIGH (ref 70–99)
Potassium: 3.9 mmol/L (ref 3.5–5.1)
Sodium: 136 mmol/L (ref 135–145)

## 2023-01-19 NOTE — Discharge Instructions (Signed)
You were seen in the emergency department for weakness.  Your workup showed no signs of stroke, dehydration, anemia or infection.  The weakness may have been related to being out in the heat today you should make sure that you are staying well-hydrated and not staying in the heat for prolonged period of time.  You can follow-up with your primary doctor to have your symptoms rechecked.  You should return to the emergency department if you have significantly worsening weakness and are unable to walk, you have numbness or weakness on one side the body compared to the other or if you have any other new or concerning symptoms.

## 2023-01-19 NOTE — ED Triage Notes (Signed)
Pt present with wife. Wife states that pt "hasn't been acting right" today. Pt has dementia. Wife states that pt has been having new mobility issues today and she has noticed his urine stream has decreased and has a strong smell.

## 2023-01-19 NOTE — ED Provider Notes (Signed)
[facility] EMERGENCY DEPARTMENT AT [facility] Provider Note   CSN: [id number] Arrival date & time: 01/19/23  1953     History  Chief Complaint  Patient presents with   Possible UTI    Alexander Donaldson is a 89 y.o. male.  Patient is an 89 year old male with a past medical history of dementia, hypertension, diabetes and CAD presenting to the emergency department with weakness.  The patient is here with his wife who reports that she took him out of the house today for the first time in the last several days since it has been so hot out.  She states that they were out for about an hour running errands and when they came back the patient was having weakness and is having difficulty getting out of the car.  Is mostly complaining of weakness in his right leg.  She states that he did not have any falls or injuries and denied any pain to his leg.  She states that he rested for several hours but was continuing to feel weak which prompted her to bring him to the emergency department.  She states that she also noticed today that his urine appeared dark and took him longer to start urinating than normal.  Patient denies any numbness.  Denies any recent nausea, vomiting, diarrhea, fevers.  She states that he has had a mild cough.  The history is provided by the spouse. History limited by: Level 5 caveat for dementia.       Home Medications Prior to Admission medications   Medication Sig Start Date End Date Taking? Authorizing Provider  acetaminophen (TYLENOL) 500 MG tablet Take 500 mg by mouth every 6 (six) hours as needed for moderate pain.    [provider]  Alogliptin Benzoate 12.5 MG TABS Take 12.5 mg by mouth daily. 03/30/21   [provider]  amLODipine (NORVASC) 10 MG tablet Take 10 mg by mouth every evening.    [provider]  aspirin 81 MG tablet Take 81 mg by mouth daily.    [provider]  atorvastatin (LIPITOR) 80 MG tablet Take 40 mg by  mouth every evening.    [provider]  clopidogrel (PLAVIX) 75 MG tablet Take 75 mg by mouth daily. 01/02/17   [provider]  diclofenac sodium (VOLTAREN) 1 % GEL Apply 2 g topically 4 (four) times daily as needed (pain). 11/01/18   Melene Plan, DO  losartan (COZAAR) 100 MG tablet Take 100 mg by mouth daily.     [provider]  memantine (NAMENDA) 10 MG tablet Take 1 tablet (10 mg total) by mouth 2 (two) times daily. Start only after first finishing starter pack 05/24/21   Micki Riley, MD  metoprolol succinate (TOPROL-XL) 50 MG 24 hr tablet Take 50 mg by mouth at bedtime. Take with or immediately following a meal.    [provider]  Multiple Vitamin (MULTIVITAMIN) tablet Take 1 tablet by mouth daily.    [provider]  nitroGLYCERIN (NITROSTAT) 0.4 MG SL tablet Place 1 tablet (0.4 mg total) under the tongue every 5 (five) minutes x 3 doses as needed for chest pain. 03/18/12   Rinaldo Cloud, MD  psyllium (HYDROCIL/METAMUCIL) 95 % PACK Take 1 packet by mouth daily.    [provider]      Allergies    Lisinopril and Metformin    Review of Systems   Review of Systems  Physical Exam Updated Vital Signs BP 135/79 (BP  Location: Right Arm)   Pulse 70   Temp 97.8 F (36.6 C) (Oral)   Resp 16   SpO2 99%  Physical Exam Vitals and nursing note reviewed.  Constitutional:      General: He is not in acute distress.    Appearance: Normal appearance.  HENT:     Head: Normocephalic and atraumatic.     Nose: Nose normal.     Mouth/Throat:     Mouth: Mucous membranes are moist.     Pharynx: Oropharynx is clear.  Eyes:     Extraocular Movements: Extraocular movements intact.     Conjunctiva/sclera: Conjunctivae normal.  Cardiovascular:     Rate and Rhythm: Normal rate and regular rhythm.     Heart sounds: Normal heart sounds.  Pulmonary:     Effort: Pulmonary effort is normal.     Breath sounds: Normal breath sounds.  Abdominal:      General: Abdomen is flat.     Palpations: Abdomen is soft.     Tenderness: There is no abdominal tenderness.  Musculoskeletal:        General: No tenderness (bilateral LE). Normal range of motion.     Cervical back: Normal range of motion.  Skin:    General: Skin is warm and dry.  Neurological:     Mental Status: He is alert. Mental status is at baseline.     Cranial Nerves: No cranial nerve deficit.     Sensory: No sensory deficit.     Motor: No weakness (5/5 strength in plantar/dorsiflexion, knee extension, hip flexion bilaterally).  Psychiatric:        Mood and Affect: Mood normal.        Behavior: Behavior normal.     ED Results / Procedures / Treatments   Labs (all labs ordered are listed, but only abnormal results are displayed) Labs Reviewed  BASIC METABOLIC PANEL - Abnormal; Notable for the following components:      Result Value   Glucose, Bld 212 (*)    All other components within normal limits  URINALYSIS, W/ REFLEX TO CULTURE (INFECTION SUSPECTED) - Abnormal; Notable for the following components:   Color, Urine STRAW (*)    Glucose, UA 150 (*)    All other components within normal limits  CBC    EKG EKG Interpretation Date/Time:  Thursday January 19 2023 22:33:19 EDT Ventricular Rate:  70 PR Interval:  163 QRS Duration:  105 QT Interval:  389 QTC Calculation: 420 R Axis:   -17  Text Interpretation: Sinus rhythm Borderline left axis deviation PVC resolved from prior EKG today Confirmed by Elayne Snare ([id number]) on 01/19/2023 10:40:01 PM  Radiology DG Chest Port 1 View  Result Date: 01/19/2023 CLINICAL DATA:  Cough. EXAM: PORTABLE CHEST 1 VIEW COMPARISON:  Chest radiograph dated 12/10/2022. FINDINGS: The heart size and mediastinal contours are within normal limits. Both lungs are clear. The visualized skeletal structures are unremarkable. IMPRESSION: No active disease. Electronically Signed   By: Elgie Collard M.D.   On: 01/19/2023 21:49     Procedures Procedures    Medications Ordered in ED Medications - No data to display  ED Course/ Medical Decision Making/ A&P Clinical Course as of 01/19/23 2255  Thu Jan 19, 2023  2212 Mild hyperglycemia otherwise no signs of UTI or other infection or obvious explanation of weakness today.  [VK]    Clinical Course User Index [VK] Rexford Maus, DO  Medical Decision Making This patient presents to the ED with chief complaint(s) of weakness with pertinent past medical history of dementia, CAD, HTN, DM which further complicates the presenting complaint. The complaint involves an extensive differential diagnosis and also carries with it a high risk of complications and morbidity.    The differential diagnosis includes patient has no focal neurologic deficits on exam making CVA unlikely, considering dehydration, electrolyte abnormality, infection, arrhythmia, anemia  Additional history obtained: Additional history obtained from spouse Records reviewed Primary Care Documents  ED Course and Reassessment: On patient's arrival he is hemodynamically stable in no acute distress without focal neurologic deficits.  The patient will have urine, labs, EKG and chest x-ray to evaluate for cause of his weakness and will be closely reassessed.  Independent labs interpretation:  The following labs were independently interpreted: Mild hyperglycemia otherwise within normal range  Independent visualization of imaging: - I independently visualized the following imaging with scope of interpretation limited to determining acute life threatening conditions related to emergency care: Chest x-ray, which revealed no acute disease  Consultation: - Consulted or discussed management/test interpretation w/ external professional: N/A  Consideration for admission or further workup: Patient has no emergent conditions requiring admission or further work-up at this time and is  stable for discharge home with primary care follow-up  Social Determinants of health: N/A    Amount and/or Complexity of Data Reviewed Labs: ordered. Radiology: ordered.          Final Clinical Impression(s) / ED Diagnoses Final diagnoses:  None    Rx / DC Orders ED Discharge Orders     None         Rexford Maus, DO 01/19/23 2301

## 2023-01-24 ENCOUNTER — Ambulatory Visit (INDEPENDENT_AMBULATORY_CARE_PROVIDER_SITE_OTHER): Payer: Medicare Other | Admitting: Primary Care

## 2023-01-24 ENCOUNTER — Encounter: Payer: Self-pay | Admitting: Primary Care

## 2023-01-24 VITALS — BP 124/62 | HR 70 | Temp 97.7°F | Ht 64.0 in | Wt 157.0 lb

## 2023-01-24 DIAGNOSIS — F0280 Dementia in other diseases classified elsewhere without behavioral disturbance: Secondary | ICD-10-CM

## 2023-01-24 DIAGNOSIS — G309 Alzheimer's disease, unspecified: Secondary | ICD-10-CM | POA: Diagnosis not present

## 2023-01-24 NOTE — Progress Notes (Signed)
Subjective:    Patient ID: Alexander Donaldson, male    DOB: 1933/07/21, 89 y.o.   MRN: [id number]  HPI  Alexander Donaldson is a very pleasant 89 y.o. male with a history of dementia, CAD with NSTEMI, type 2 diabetes, hypertension who presents today for emergency department follow-up.  His wife accompanies him today who provides the information for HPI given his dementia.  He presented to [facility] emergency department with his wife on 01/19/2023.  His wife brought him in for evaluation of right lower extremity weakness and difficulty getting out of the car while running errands that day.  She also noticed a darker color to his urine than normal and a mild cough.  During his stay in the ED his EKG showed sinus rhythm, borderline left axis deviation, otherwise no acute process.  He underwent chest x-ray which was negative for acute process.  BMP and CBC were grossly negative.  Urinalysis revealed glucose, otherwise negative.  He was discharged home later that evening.  Today he feels well. His wife mentions that his lower extremity weakness has resolved and he is back at baseline. His wife denies changes in his speech, foul smelling urine, dark colored urine. He denies dizziness, pain. He is eating well, appetite is normal. He's increased his intake of water.   His wife believes that his dementia and memory have progressed since last week. His last A1C was 7.2 at the [facility] in June 2024. He recently began sitagliptan 100 mg daily.     Review of Systems  Respiratory:  Negative for shortness of breath.   Cardiovascular:  Negative for chest pain.  Neurological:  Negative for dizziness and weakness.  Psychiatric/Behavioral:  Positive for confusion.          Past Medical History:  Diagnosis Date   Acute encephalopathy 02/12/2018   Alzheimer's dementia (HCC)    CAD (coronary artery disease)    CAP (community acquired pneumonia) 02/11/2018   Carpal tunnel syndrome, bilateral 02/25/2020    Coronary artery disease    Diabetes mellitus    Diverticulitis    Hearing loss    Hypertension    Hypokalemia 02/12/2018   NSTEMI (non-ST elevated myocardial infarction) (HCC)     Social History   Socioeconomic History   Marital status: Married    Spouse name: Clydie Braun   Number of children: Not on file   Years of education: Not on file   Highest education level: Not on file  Occupational History   Not on file  Tobacco Use   Smoking status: Never   Smokeless tobacco: Never  Vaping Use   Vaping status: Never Used  Substance and Sexual Activity   Alcohol use: No    Comment: social   Drug use: No   Sexual activity: Yes    Birth control/protection: None  Other Topics Concern   Not on file  Social History Narrative   Lives with wife   Right Handed   Drinks 1-2 cups caffeine daily   Social Determinants of Health   Financial Resource Strain: Low Risk  (12/30/2021)   Overall Financial Resource Strain (CARDIA)    Difficulty of Paying Living Expenses: Not hard at all  Food Insecurity: No Food Insecurity (12/30/2021)   Hunger Vital Sign    Worried About Running Out of Food in the Last Year: Never true    Ran Out of Food in the Last Year: Never true  Transportation Needs: No Transportation Needs (12/30/2021)   PRAPARE -  Administrator, Civil Service (Medical): No    Lack of Transportation (Non-Medical): No  Physical Activity: Inactive (12/30/2021)   Exercise Vital Sign    Days of Exercise per Week: 0 days    Minutes of Exercise per Session: 0 min  Stress: No Stress Concern Present (12/30/2021)   [employer] of Occupational Health - Occupational Stress Questionnaire    Feeling of Stress : Only a little  Social Connections: Not on file  Intimate Partner Violence: Not on file    Past Surgical History:  Procedure Laterality Date   ANGIOPLASTY     CORONARY STENT INTERVENTION N/A 02/02/2017   Procedure: Coronary Stent Intervention;  Surgeon: Rinaldo Cloud, MD;   Location: [facility] INVASIVE CV LAB;  Service: Cardiovascular;  Laterality: N/A;   CORONARY STENTS     LEFT HEART CATH AND CORONARY ANGIOGRAPHY N/A 02/02/2017   Procedure: Left Heart Cath and Coronary Angiography;  Surgeon: Rinaldo Cloud, MD;  Location: [facility] INVASIVE CV LAB;  Service: Cardiovascular;  Laterality: N/A;   LEFT HEART CATHETERIZATION WITH CORONARY ANGIOGRAM N/A 03/16/2012   Procedure: LEFT HEART CATHETERIZATION WITH CORONARY ANGIOGRAM;  Surgeon: Robynn Pane, MD;  Location: [facility] CATH LAB;  Service: Cardiovascular;  Laterality: N/A;   PERCUTANEOUS CORONARY STENT INTERVENTION (PCI-S)  03/16/2012   Procedure: PERCUTANEOUS CORONARY STENT INTERVENTION (PCI-S);  Surgeon: Robynn Pane, MD;  Location: [facility] CATH LAB;  Service: Cardiovascular;;   TONSILLECTOMY      History reviewed. No pertinent family history.  Allergies  Allergen Reactions   Lisinopril     Other reaction(s): Cough   Metformin Diarrhea    Current Outpatient Medications on File Prior to Visit  Medication Sig Dispense Refill   metoprolol tartrate (LOPRESSOR) 25 MG tablet Take 25 mg by mouth 2 (two) times daily.     sitaGLIPtin (JANUVIA) 100 MG tablet Take 100 mg by mouth daily.     acetaminophen (TYLENOL) 500 MG tablet Take 500 mg by mouth every 6 (six) hours as needed for moderate pain.     amLODipine (NORVASC) 10 MG tablet Take 10 mg by mouth every evening.     aspirin 81 MG tablet Take 81 mg by mouth daily.     atorvastatin (LIPITOR) 80 MG tablet Take 40 mg by mouth every evening.     clopidogrel (PLAVIX) 75 MG tablet Take 75 mg by mouth daily.  3   diclofenac sodium (VOLTAREN) 1 % GEL Apply 2 g topically 4 (four) times daily as needed (pain). 100 g 0   losartan (COZAAR) 100 MG tablet Take 100 mg by mouth daily.      memantine (NAMENDA) 10 MG tablet Take 1 tablet (10 mg total) by mouth 2 (two) times daily. Start only after first finishing starter pack 60 tablet 3   Multiple Vitamin (MULTIVITAMIN) tablet Take 1 tablet by  mouth daily.     nitroGLYCERIN (NITROSTAT) 0.4 MG SL tablet Place 1 tablet (0.4 mg total) under the tongue every 5 (five) minutes x 3 doses as needed for chest pain. 25 tablet 3   psyllium (HYDROCIL/METAMUCIL) 95 % PACK Take 1 packet by mouth daily.     No current facility-administered medications on file prior to visit.    BP 124/62   Pulse 70   Temp 97.7 F (36.5 C) (Temporal)   Ht 5\' 4"  (1.626 m)   Wt 157 lb (71.2 kg)   SpO2 99%   BMI 26.95 kg/m  Objective:   Physical Exam Constitutional:  General: He is not in acute distress.    Appearance: He is not ill-appearing.  Cardiovascular:     Rate and Rhythm: Normal rate and regular rhythm.  Pulmonary:     Effort: Pulmonary effort is normal.     Breath sounds: Normal breath sounds.  Skin:    General: Skin is warm and dry.  Neurological:     Mental Status: He is alert.     Comments: Does not participate in HPI, cannot answer questions. Looks to his wife for answers.   Psychiatric:        Mood and Affect: Mood normal.           Assessment & Plan:  Alzheimer's dementia without behavioral disturbance, psychotic disturbance, mood disturbance, or anxiety, unspecified dementia severity, unspecified timing of dementia onset (HCC) Assessment & Plan: Progressing.  Reviewed ED notes, labs, imaging from recent visit. Exam today appears to be at baseline.  Wife agrees.  Continue good hydration with water. Continue general care through the [facility].  Return precautions provided.          Doreene Nest, NP

## 2023-01-24 NOTE — Patient Instructions (Signed)
Continue good intake of water.  It was a pleasure to see you today!

## 2023-01-24 NOTE — Assessment & Plan Note (Signed)
Progressing.  Reviewed ED notes, labs, imaging from recent visit. Exam today appears to be at baseline.  Wife agrees.  Continue good hydration with water. Continue general care through the [facility].  Return precautions provided.

## 2023-01-25 ENCOUNTER — Telehealth: Payer: Self-pay

## 2023-01-25 NOTE — Telephone Encounter (Signed)
Transition Care Management Unsuccessful Follow-up Telephone Call  Date of discharge and from where:  [facility] 7/11  Attempts:  2nd Attempt  Reason for unsuccessful TCM follow-up call:  No answer/busy   Alexander Donaldson [facility] Guide, [facility] [phone] [address] [place], [place], [place] [address] Phone: [phone] Email: [email or web address]

## 2023-01-25 NOTE — Telephone Encounter (Signed)
Transition Care Management Unsuccessful Follow-up Telephone Call  Date of discharge and from where:  [facility] 7/11  Attempts:  1st Attempt  Reason for unsuccessful TCM follow-up call:  No answer/busy   Lenard Forth [facility] Guide, [facility] [phone] [address] [place], [place], [place] [address] Phone: [phone] Email: [email or web address]

## 2023-02-01 ENCOUNTER — Encounter: Payer: Self-pay | Admitting: Podiatry

## 2023-02-01 ENCOUNTER — Ambulatory Visit (INDEPENDENT_AMBULATORY_CARE_PROVIDER_SITE_OTHER): Payer: Medicare Other | Admitting: Podiatry

## 2023-02-01 DIAGNOSIS — R1310 Dysphagia, unspecified: Secondary | ICD-10-CM | POA: Insufficient documentation

## 2023-02-01 DIAGNOSIS — Z7189 Other specified counseling: Secondary | ICD-10-CM | POA: Insufficient documentation

## 2023-02-01 DIAGNOSIS — M79674 Pain in right toe(s): Secondary | ICD-10-CM | POA: Diagnosis not present

## 2023-02-01 DIAGNOSIS — M79675 Pain in left toe(s): Secondary | ICD-10-CM

## 2023-02-01 DIAGNOSIS — E1159 Type 2 diabetes mellitus with other circulatory complications: Secondary | ICD-10-CM

## 2023-02-01 DIAGNOSIS — B351 Tinea unguium: Secondary | ICD-10-CM

## 2023-02-01 DIAGNOSIS — Z135 Encounter for screening for eye and ear disorders: Secondary | ICD-10-CM | POA: Insufficient documentation

## 2023-02-01 NOTE — Progress Notes (Signed)
  Subjective:  Patient ID: Alexander Donaldson, male    DOB: Jun 08, 1934,  MRN: [id number]  Alexander Donaldson presents to clinic today for at risk foot care. Pt has h/o NIDDM with PAD and painful thick toenails that are difficult to trim. Pain interferes with ambulation. Aggravating factors include wearing enclosed shoe gear. Pain is relieved with periodic professional debridement.  Chief Complaint  Patient presents with   Diabetes    DFC BS - DIDN'T CHECK IT TODAY A1C - 7.2 [facility] - 10/2022   New problem(s): None.   PCP is Doreene Nest, NP.  Allergies  Allergen Reactions   Lisinopril     Other reaction(s): Cough   Metformin Diarrhea    Review of Systems: Negative except as noted in the HPI.  Objective: No changes noted in today's physical examination. There were no vitals filed for this visit. Alexander Donaldson is a pleasant 89 y.o. male WD, WN in NAD. AAO x 3.   Vascular Examination: CFT <3 seconds b/l. DP pulses faintly palpable b/l. PT pulses nonpalpable b/l. Digital hair absent. Skin temperature gradient warm to warm b/l. No pain with calf compression. No ischemia or gangrene. No cyanosis or clubbing noted b/l. No edema b/l.  Neurological Examination: Sensation grossly intact b/l with 10 gram monofilament. Vibratory sensation intact b/l. Pt has subjective symptoms of neuropathy.  Dermatological Examination: Pedal skin warm and supple b/l. No open wounds b/l. No interdigital macerations. Toenails 1-5 b/l thick, discolored, elongated with subungual debris and pain on dorsal palpation.  No corns, calluses nor porokeratotic lesions noted.  Musculoskeletal Examination: Normal muscle strength 5/5 to all lower extremity muscle groups bilaterally. Dropfoot left lower extremity. Hammertoe deformity noted 2-5 b/l.Marland Kitchen No pain, crepitus or joint limitation noted with ROM b/l LE.  Patient ambulates independently without assistive aids.  Radiographs: None  Assessment/Plan: 1. Pain due to  onychomycosis of toenails of both feet   2. Type 2 diabetes mellitus with vascular disease (HCC)     -Consent given for treatment as described below: -Examined patient. -Continue foot and shoe inspections daily. Monitor blood glucose per PCP/Endocrinologist's recommendations. -Toenails 1-5 b/l were debrided in length and girth with sterile nail nippers and dremel without iatrogenic bleeding.  -Patient/POA to call should there be question/concern in the interim.   Return in about 3 months (around 05/04/2023).  Freddie Breech, DPM

## 2023-02-20 ENCOUNTER — Ambulatory Visit (INDEPENDENT_AMBULATORY_CARE_PROVIDER_SITE_OTHER): Payer: Medicare Other

## 2023-02-20 VITALS — BP 124/62 | Ht 63.0 in | Wt 157.0 lb

## 2023-02-20 DIAGNOSIS — Z789 Other specified health status: Secondary | ICD-10-CM | POA: Diagnosis not present

## 2023-02-20 DIAGNOSIS — F0280 Dementia in other diseases classified elsewhere without behavioral disturbance: Secondary | ICD-10-CM

## 2023-02-20 DIAGNOSIS — Z0000 Encounter for general adult medical examination without abnormal findings: Secondary | ICD-10-CM

## 2023-02-20 DIAGNOSIS — G309 Alzheimer's disease, unspecified: Secondary | ICD-10-CM

## 2023-02-20 NOTE — Progress Notes (Cosign Needed)
 Because this visit was a virtual/telehealth visit,  certain criteria was not obtained, such a blood pressure, CBG if patient is a diabetic, and timed up and go. Any medications not marked as "taking" was not mentioned during the medication reconciliation part of the visit. Any vitals not documented were not able to be obtained due to this being a telehealth visit. Vitals documented are verbally provided by the patient.   Visit was completed with the help of patient's spouse due to severe Alzheimers disease Subjective:   Alexander Donaldson is a 89 y.o. male who presents for Medicare Annual/Subsequent preventive examination.  Visit Complete: Virtual  I connected with  [name] on 02/20/23 by a audio enabled telemedicine application and verified that I am speaking with the correct person using two identifiers.  Patient Location: Home  Provider Location: Home Office  I discussed the limitations of evaluation and management by telemedicine. The patient expressed understanding and agreed to proceed.  Patient Medicare AWV questionnaire was completed by the patient on n/a; I have confirmed that all information answered by patient is correct and no changes since this date.  Review of Systems     Cardiac Risk Factors include: advanced age (>12men, >74 women);diabetes mellitus;dyslipidemia;hypertension;male gender;sedentary lifestyle     Objective:    Today's Vitals   02/20/23 1105  BP: 124/62  Weight: 157 lb (71.2 kg)  Height: 5\' 3"  (1.6 m)   Body mass index is 27.81 kg/m.     02/20/2023   11:12 AM 01/19/2023    8:18 PM 01/19/2023    8:10 PM 12/10/2022    3:29 AM 01/29/2022   11:26 PM 12/30/2021    3:22 PM 11/30/2021    9:31 AM  Advanced Directives  Does Patient Have a Medical Advance Directive? No Yes Yes Unable to assess, patient is non-responsive or altered mental status No No Unable to assess, patient is non-responsive or altered mental status  Type of Advance Directive   Healthcare Power of [employer] Power of Attorney      Would patient like information on creating a medical advance directive? No - Patient declined    No - Patient declined      Current Medications (verified) Outpatient Encounter Medications as of 02/20/2023  Medication Sig   acetaminophen (TYLENOL) 500 MG tablet Take 500 mg by mouth every 6 (six) hours as needed for moderate pain.   amLODipine (NORVASC) 10 MG tablet Take 10 mg by mouth every evening.   aspirin 81 MG tablet Take 81 mg by mouth daily.   atorvastatin (LIPITOR) 80 MG tablet Take 40 mg by mouth every evening.   clopidogrel (PLAVIX) 75 MG tablet Take 75 mg by mouth daily.   diclofenac sodium (VOLTAREN) 1 % GEL Apply 2 g topically 4 (four) times daily as needed (pain).   losartan (COZAAR) 100 MG tablet Take 100 mg by mouth daily.    memantine (NAMENDA) 10 MG tablet Take 1 tablet (10 mg total) by mouth 2 (two) times daily. Start only after first finishing starter pack   metoprolol tartrate (LOPRESSOR) 25 MG tablet Take 25 mg by mouth 2 (two) times daily.   Multiple Vitamin (MULTIVITAMIN) tablet Take 1 tablet by mouth daily.   nitroGLYCERIN (NITROSTAT) 0.4 MG SL tablet Place 1 tablet (0.4 mg total) under the tongue every 5 (five) minutes x 3 doses as needed for chest pain.   psyllium (HYDROCIL/METAMUCIL) 95 % PACK Take 1 packet by mouth daily.   sitaGLIPtin (JANUVIA) 100 MG  tablet Take 100 mg by mouth daily.   No facility-administered encounter medications on file as of 02/20/2023.    Allergies (verified) Lisinopril and Metformin   History: Past Medical History:  Diagnosis Date   Acute encephalopathy 02/12/2018   Alzheimer's dementia (HCC)    CAD (coronary artery disease)    CAP (community acquired pneumonia) 02/11/2018   Carpal tunnel syndrome, bilateral 02/25/2020   Coronary artery disease    Diabetes mellitus    Diverticulitis    Hearing loss    Hypertension    Hypokalemia 02/12/2018   NSTEMI (non-ST  elevated myocardial infarction) [facility])    Past Surgical History:  Procedure Laterality Date   ANGIOPLASTY     CORONARY STENT INTERVENTION N/A 02/02/2017   Procedure: Coronary Stent Intervention;  Surgeon: Rinaldo Cloud, MD;  Location: [facility] INVASIVE CV LAB;  Service: Cardiovascular;  Laterality: N/A;   CORONARY STENTS     LEFT HEART CATH AND CORONARY ANGIOGRAPHY N/A 02/02/2017   Procedure: Left Heart Cath and Coronary Angiography;  Surgeon: Rinaldo Cloud, MD;  Location: [facility] INVASIVE CV LAB;  Service: Cardiovascular;  Laterality: N/A;   LEFT HEART CATHETERIZATION WITH CORONARY ANGIOGRAM N/A 03/16/2012   Procedure: LEFT HEART CATHETERIZATION WITH CORONARY ANGIOGRAM;  Surgeon: Robynn Pane, MD;  Location: [facility] CATH LAB;  Service: Cardiovascular;  Laterality: N/A;   PERCUTANEOUS CORONARY STENT INTERVENTION (PCI-S)  03/16/2012   Procedure: PERCUTANEOUS CORONARY STENT INTERVENTION (PCI-S);  Surgeon: Robynn Pane, MD;  Location: [facility] CATH LAB;  Service: Cardiovascular;;   TONSILLECTOMY     History reviewed. No pertinent family history. Social History   Socioeconomic History   Marital status: Married    Spouse name: Clydie Braun   Number of children: Not on file   Years of education: Not on file   Highest education level: Not on file  Occupational History   Not on file  Tobacco Use   Smoking status: Never   Smokeless tobacco: Never  Vaping Use   Vaping status: Never Used  Substance and Sexual Activity   Alcohol use: No    Comment: social   Drug use: No   Sexual activity: Yes    Birth control/protection: None  Other Topics Concern   Not on file  Social History Narrative   Lives with wife   Right Handed   Drinks 1-2 cups caffeine daily   Social Determinants of Health   Financial Resource Strain: Low Risk  (02/20/2023)   Overall Financial Resource Strain (CARDIA)    Difficulty of Paying Living Expenses: Not hard at all  Food Insecurity: No Food Insecurity (02/20/2023)   Hunger Vital Sign     Worried About Running Out of Food in the Last Year: Never true    Ran Out of Food in the Last Year: Never true  Transportation Needs: No Transportation Needs (02/20/2023)   PRAPARE - Administrator, Civil Service (Medical): No    Lack of Transportation (Non-Medical): No  Physical Activity: Inactive (02/20/2023)   Exercise Vital Sign    Days of Exercise per Week: 0 days    Minutes of Exercise per Session: 0 min  Stress: Patient Unable To Answer (02/20/2023)   [employer] of Occupational Health - Occupational Stress Questionnaire    Feeling of Stress : Patient unable to answer  Social Connections: Patient Unable To Answer (02/20/2023)   Social Connection and Isolation Panel [NHANES]    Frequency of Communication with Friends and Family: Patient unable to answer    Frequency of Social  Gatherings with Friends and Family: Patient unable to answer    Attends Religious Services: Patient unable to answer    Active Member of Clubs or Organizations: Patient unable to answer    Attends Banker Meetings: Patient unable to answer    Marital Status: Patient unable to answer    Tobacco Counseling Counseling given: Yes   Clinical Intake:  Pre-visit preparation completed: Yes  Pain : No/denies pain     BMI - recorded: 27.81 Nutritional Status: BMI 25 -29 Overweight Nutritional Risks: None Diabetes: Yes CBG done?: No (telehealth visit. unable to obtain cbg. wife states she checks cbg ~2 x weekly) Did pt. bring in CBG monitor from home?: No  How often do you need to have someone help you when you read instructions, pamphlets, or other written materials from your doctor or pharmacy?: 5 - Always (wife does the majority of things he nees. she provides total care for the patient)  Interpreter Needed?: No  Information entered by ::  Kennley Schwandt, CMA   Activities of Daily Living    02/20/2023   11:09 AM  In your present state of health, do you have any difficulty  performing the following activities:  Hearing? 1  Comment wears hearing aids  Vision? 0  Difficulty concentrating or making decisions? 1  Comment dx of Alheizemers Dz  Walking or climbing stairs? 1  Comment uses cane in the house and rolling walker or wheelchair when out and about  Dressing or bathing? 1  Comment wife helps him  Doing errands, shopping? 1  Comment wife takes him  Quarry manager and eating ? Y  Comment wife does this  Using the Toilet? Y  Comment wife assists him  In the past six months, have you accidently leaked urine? Y  Do you have problems with loss of bowel control? Y  Managing your Medications? Y  Comment wife manages his medications  Managing your Finances? Y  Comment wife takes care of his finances  Housekeeping or managing your Housekeeping? Y  Comment wife does the housekeeping    Patient Care Team: Doreene Nest, NP as PCP - General (Internal Medicine)  Indicate any recent Medical Services you may have received from other than [facility] providers in the past year (date may be approximate).     Assessment:   This is a routine wellness examination for [facility].  Hearing/Vision screen Hearing Screening - Comments:: Wears hearing aids   Dietary issues and exercise activities discussed:     Goals Addressed             This Visit's Progress    Patient Stated       "To feel better and get stronger"       Depression Screen    02/20/2023   11:15 AM 12/30/2021    3:24 PM 03/15/2017    1:03 PM 04/23/2012    9:12 AM  PHQ 2/9 Scores  PHQ - 2 Score  0 0 0  Exception Documentation Medical reason       Fall Risk    02/20/2023   11:12 AM 12/30/2021    3:23 PM 03/07/2017    8:50 AM  Fall Risk   Falls in the past year? 1 1 No  Comment  tripped over door jam   Number falls in past yr: 1 1   Injury with Fall? 1 0   Risk for fall due to : Impaired balance/gait;Orthopedic patient;Impaired mobility Impaired balance/gait;Impaired  mobility;Medication side effect Impaired mobility;Mental  status change;History of fall(s)  Follow up Education provided;Falls prevention discussed;Falls evaluation completed Falls evaluation completed;Education provided;Falls prevention discussed     MEDICARE RISK AT HOME:  Medicare Risk at Home - 02/20/23 1112     Any stairs in or around the home? No    If so, are there any without handrails? No    Home free of loose throw rugs in walkways, pet beds, electrical cords, etc? Yes    Adequate lighting in your home to reduce risk of falls? Yes    Life alert? No    Use of a cane, walker or w/c? Yes    Grab bars in the bathroom? Yes    Shower chair or bench in shower? Yes    Elevated toilet seat or a handicapped toilet? Yes             TIMED UP AND GO:  Was the test performed?  No    Cognitive Function:    03/31/2020    1:27 PM  MMSE - Mini Mental State Exam  Orientation to time 2  Orientation to Place 1  Registration 3  Attention/ Calculation 0  Recall 0  Language- name 2 objects 2  Language- repeat 1  Language- follow 3 step command 2  Language- read & follow direction 0  Write a sentence 1  Copy design 1  Copy design-comments 3 animals  Total score 13        02/20/2023   11:13 AM  6CIT Screen  What Year? 4 points  What month? 3 points  What time? 3 points  Count back from 20 4 points  Months in reverse 4 points  Repeat phrase 10 points  Total Score 28 points    Immunizations Immunization History  Administered Date(s) Administered   COVID-19, mRNA, vaccine(Comirnaty)12 years and older 08/18/2022   Influenza, Seasonal, Injecte, Preservative Fre 05/11/2011, 04/27/2012, 05/20/2013, 07/22/2014   Influenza,inj,Quad PF,6+ Mos 04/18/2019, 06/17/2020   Influenza,trivalent, recombinat, inj, PF 05/08/2017, 05/11/2018   Influenza-Unspecified 06/03/2015, 05/11/2016, 04/11/2019   PFIZER Comirnaty(Gray Top)Covid-19 Tri-Sucrose Vaccine 01/12/2021   PFIZER(Purple  Top)SARS-COV-2 Vaccination 08/17/2019, 09/11/2019, 04/25/2020, 10/20/2021   Pfizer Covid-19 Vaccine Bivalent Booster 7yrs & up 10/20/2021   Pneumococcal Conjugate-13 02/20/2014   Pneumococcal Polysaccharide-23 04/27/2012   Tdap 11/23/2011   Zoster Recombinant(Shingrix) 12/23/2019, 05/25/2020    TDAP status: Due, Education has been provided regarding the importance of this vaccine. Advised may receive this vaccine at local pharmacy or Health Dept. Aware to provide a copy of the vaccination record if obtained from local pharmacy or Health Dept. Verbalized acceptance and understanding.  Flu Vaccine status: Due, Education has been provided regarding the importance of this vaccine. Advised may receive this vaccine at local pharmacy or Health Dept. Aware to provide a copy of the vaccination record if obtained from local pharmacy or Health Dept. Verbalized acceptance and understanding.  Pneumococcal vaccine status: Up to date  Covid-19 vaccine status: Information provided on how to obtain vaccines.   Qualifies for Shingles Vaccine? No   Zostavax completed No   Shingrix Completed?: Yes  Screening Tests Health Maintenance  Topic Date Due   DTaP/Tdap/Td (2 - Td or Tdap) 11/22/2021   HEMOGLOBIN A1C  08/02/2022   COVID-19 Vaccine (8 - 2023-24 season) 12/17/2022   Medicare Annual Wellness (AWV)  12/31/2022   INFLUENZA VACCINE  02/09/2023   OPHTHALMOLOGY EXAM  09/21/2023   FOOT EXAM  09/27/2023   Pneumonia Vaccine 47+ Years old  Completed   Zoster Vaccines- Shingrix  Completed  HPV VACCINES  Aged Out    Health Maintenance  Health Maintenance Due  Topic Date Due   DTaP/Tdap/Td (2 - Td or Tdap) 11/22/2021   HEMOGLOBIN A1C  08/02/2022   COVID-19 Vaccine (8 - 2023-24 season) 12/17/2022   Medicare Annual Wellness (AWV)  12/31/2022   INFLUENZA VACCINE  02/09/2023    Colorectal cancer screening: No longer required.   Lung Cancer Screening: (Low Dose CT Chest recommended if Age 68-80  years, 20 pack-year currently smoking OR have quit w/in 15years.) does not qualify.     Additional Screening:  Hepatitis C Screening: does not qualify  Vision Screening: Recommended annual ophthalmology exams for early detection of glaucoma and other disorders of the eye. Is the patient up to date with their annual eye exam?  Yes  Who is the provider or what is the name of the office in which the patient attends annual eye exams? Dr. Randalyn Rhea  Dental Screening: Recommended annual dental exams for proper oral hygiene  Diabetic Foot Exam: Diabetic Foot Exam: Completed 09/27/2022  Community Resource Referral / Chronic Care Management: CRR required this visit?  Yes   CCM required this visit?  No     Plan:     I have personally reviewed and noted the following in the patient's chart:   Medical and social history Use of alcohol, tobacco or illicit drugs  Current medications and supplements including opioid prescriptions. Patient is not currently taking opioid prescriptions. Functional ability and status Nutritional status Physical activity Advanced directives List of other physicians Hospitalizations, surgeries, and ER visits in previous 12 months Vitals Screenings to include cognitive, depression, and falls Referrals and appointments  In addition, I have reviewed and discussed with patient certain preventive protocols, quality metrics, and best practice recommendations. A written personalized care plan for preventive services as well as general preventive health recommendations were provided to patient.     Jordan Hawks Aayana Reinertsen, CMA   02/20/2023   After Visit Summary: (MyChart) Due to this being a telephonic visit, the after visit summary with patients personalized plan was offered to patient via MyChart   Nurse Notes: CRR placed to check for additional resources for spouse who is the sole caregiver for the patient due to advanced alzheimers disease.

## 2023-02-20 NOTE — Patient Instructions (Signed)
Alexander Donaldson , Thank you for taking time to come for your Medicare Wellness Visit. I appreciate your ongoing commitment to your health goals. Please review the following plan we discussed and let me know if I can assist you in the future.   These are the goals we discussed:  Goals      Patient Stated     12/30/2021, keep sugar, salt down     Patient Stated     "To feel better and get stronger"        This is a list of the screening recommended for you and due dates:  Health Maintenance  Topic Date Due   DTaP/Tdap/Td vaccine (2 - Td or Tdap) 11/22/2021   Hemoglobin A1C  08/02/2022   COVID-19 Vaccine (8 - 2023-24 season) 12/17/2022   Flu Shot  02/09/2023   Eye exam for diabetics  09/21/2023   Complete foot exam   09/27/2023   Medicare Annual Wellness Visit  02/20/2024   Pneumonia Vaccine  Completed   Zoster (Shingles) Vaccine  Completed   HPV Vaccine  Aged Out    Advanced directives: Advance directive discussed with you today. Even though you declined this today, please call our office should you change your mind, and we can give you the proper paperwork for you to fill out. Advance care planning is a way to make decisions about medical care that fits your values in case you are ever unable to make these decisions for yourself.  Information on Advanced Care Planning can be found at [facility] The Advance Health Care Directives Advance Health Care Directives ([email or web address]    Conditions/risks identified:  You are due for the vaccines checked below. You may have these done at your preferred pharmacy. Please have them fax the office proof of the vaccines so that we can update your chart.   [x]  Flu (due annually) []  Shingrix (Shingles vaccine) []  Pneumonia Vaccines [x]  TDAP (Tetanus) Vaccine every 10 years [x]  Covid-19  Alzheimer's Disease Caregiver Guide Alzheimer's disease is a condition that makes a person: Forget things. Act differently. Have trouble paying  attention and doing simple tasks. Have trouble talking and responding to your questions. These things get worse with time. The tips below can help you care for the person. How to help manage lifestyle changes Tips to help with symptoms Be calm and patient. Give simple, short answers to questions. Long answers can confuse the person. Avoid correcting the person in a negative way. Do not argue with the person. This may make the person more upset. Try not to take things personally, even if the person forgets your name. Tips to lessen frustration Use simple words and a calm voice. Only give one direction at a time. Do daily tasks, like bathing and dressing, when the person is at their best. Also make any appointments for these times. Take your time. Simple tasks may take longer. Allow plenty of time to get tasks done. Limit choices for the person. Too many choices can be stressful. Involve the person in what you're doing. Keep things organized: Keep a daily routine. Organize medicines in a pillbox for each day of the week. Keep a calendar in a central place. Use it to remind the person of health care visits or other activities. Avoid new or crowded places, if possible. Buy clothes and shoes for the person that are easy to put on and take off. Try to change the subject if the person becomes frustrated or angry. This is  a great way to make things less tense. Tips to prevent injury  Keep floors clear. Remove rugs, magazine racks, and floor lamps. Keep hallways well-lit, especially at night. Put a handrail and nonslip mat in the bathtub or shower. Put childproof locks on cabinets that have dangerous items in them. These items include medicine, alcohol, guns, cleaning products, and sharp tools. Put locks on doors where the person can't see or reach them. This helps keep the person from going out of the house and getting lost. Remove car keys and lock garage doors so the person doesn't try to  drive. Be ready for emergencies. Keep a list of emergency phone numbers and addresses close by. Bracelets may be worn that track location and identify the person as having memory loss. This should be worn at all times for safety. Tips for the future  Discuss financial and legal planning early. Get help from a professional. People with this disease have trouble managing their money as the disease gets worse. Talk about advance directives, safety, and daily care. Take these steps: Create a living will and choose a power of attorney. This is someone who can make decisions for the person with Alzheimer's disease when they can no longer do so. Discuss driving safety and when to stop driving. The person's health care provider can help with this. If the person lives alone, make sure they're safe. Some people need extra help at home. Other people need more care at a nursing home or care center. How to recognize changes in the person's condition With this disease, memory problems and confusion slowly get worse. In time, the person may not know their friends and family members. The disease can cause changes in behavior and mood, such as anger or feeling worried or nervous. The person may also hallucinate. This means they see, hear, taste, smell, or feel things that aren't real. These changes can come on all of a sudden. They may happen in response to something such as: Pain. An infection. Changes in temperature. Too much stimulation or noise. Feeling lost or scared. Medicines. Where to find support Find out about services that can provide short-term care for the person. This is called respite care. It can allow you to take a break when you need one. Join a support group near you. These groups can help you: Learn ways to manage stress. Share experiences with others. Get emotional comfort and support. Learn about caregiving as the disease gets worse. Find community resources. Where to find more  information Alzheimer's Association: WesternTunes.it Contact a health care provider if: The person has a fever. The person has a sudden change in behavior that doesn't get better when you try to help calm them. The person has a sudden increase in confusion or new hallucinations. The person is not able to take care of themselves at home. You are no longer able to care for the person. Get help right away if: You feel like the person may hurt themselves or others. The person has talked about taking their own life. These symptoms may be an emergency. Take one of these steps right away: Go to your nearest emergency room. Call 911. Call the National Suicide Prevention Lifeline at (503)474-7900 or 988. Text the Crisis Text Line at 563 290 4804. This information is not intended to replace advice given to you by your health care provider. Make sure you discuss any questions you have with your health care provider. Document Revised: 09/12/2022 Document Reviewed: 08/25/2022 Elsevier Patient Education  2024  Elsevier Inc.   Next appointment: VIRTUAL/ TELEPHONE VISIT Follow up in one year for your annual wellness visit  February 22, 2024 at 2:00 pm telephone visit.    Preventive Care 60 Years and Older, Male  Preventive care refers to lifestyle choices and visits with your health care provider that can promote health and wellness. What does preventive care include? A yearly physical exam. This is also called an annual well check. Dental exams once or twice a year. Routine eye exams. Ask your health care provider how often you should have your eyes checked. Personal lifestyle choices, including: Daily care of your teeth and gums. Regular physical activity. Eating a healthy diet. Avoiding tobacco and drug use. Limiting alcohol use. Practicing safe sex. Taking low doses of aspirin every day. Taking vitamin and mineral supplements as recommended by your health care provider. What happens during an annual  well check? The services and screenings done by your health care provider during your annual well check will depend on your age, overall health, lifestyle risk factors, and family history of disease. Counseling  Your health care provider may ask you questions about your: Alcohol use. Tobacco use. Drug use. Emotional well-being. Home and relationship well-being. Sexual activity. Eating habits. History of falls. Memory and ability to understand (cognition). Work and work Astronomer. Screening  You may have the following tests or measurements: Height, weight, and BMI. Blood pressure. Lipid and cholesterol levels. These may be checked every 5 years, or more frequently if you are over 77 years old. Skin check. Lung cancer screening. You may have this screening every year starting at age 51 if you have a 30-pack-year history of smoking and currently smoke or have quit within the past 15 years. Fecal occult blood test (FOBT) of the stool. You may have this test every year starting at age 65. Flexible sigmoidoscopy or colonoscopy. You may have a sigmoidoscopy every 5 years or a colonoscopy every 10 years starting at age 45. Prostate cancer screening. Recommendations will vary depending on your family history and other risks. Hepatitis C blood test. Hepatitis B blood test. Sexually transmitted disease (STD) testing. Diabetes screening. This is done by checking your blood sugar (glucose) after you have not eaten for a while (fasting). You may have this done every 1-3 years. Abdominal aortic aneurysm (AAA) screening. You may need this if you are a current or former smoker. Osteoporosis. You may be screened starting at age 38 if you are at high risk. Talk with your health care provider about your test results, treatment options, and if necessary, the need for more tests. Vaccines  Your health care provider may recommend certain vaccines, such as: Influenza vaccine. This is recommended every  year. Tetanus, diphtheria, and acellular pertussis (Tdap, Td) vaccine. You may need a Td booster every 10 years. Zoster vaccine. You may need this after age 15. Pneumococcal 13-valent conjugate (PCV13) vaccine. One dose is recommended after age 25. Pneumococcal polysaccharide (PPSV23) vaccine. One dose is recommended after age 16. Talk to your health care provider about which screenings and vaccines you need and how often you need them. This information is not intended to replace advice given to you by your health care provider. Make sure you discuss any questions you have with your health care provider. Document Released: 07/24/2015 Document Revised: 03/16/2016 Document Reviewed: 04/28/2015 Elsevier Interactive Patient Education  2017 [employer].  Fall Prevention in the Home Falls can cause injuries. They can happen to people of all ages. There are many  things you can do to make your home safe and to help prevent falls. What can I do on the outside of my home? Regularly fix the edges of walkways and driveways and fix any cracks. Remove anything that might make you trip as you walk through a door, such as a raised step or threshold. Trim any bushes or trees on the path to your home. Use bright outdoor lighting. Clear any walking paths of anything that might make someone trip, such as rocks or tools. Regularly check to see if handrails are loose or broken. Make sure that both sides of any steps have handrails. Any raised decks and porches should have guardrails on the edges. Have any leaves, snow, or ice cleared regularly. Use sand or salt on walking paths during winter. Clean up any spills in your garage right away. This includes oil or grease spills. What can I do in the bathroom? Use night lights. Install grab bars by the toilet and in the tub and shower. Do not use towel bars as grab bars. Use non-skid mats or decals in the tub or shower. If you need to sit down in the shower, use a  plastic, non-slip stool. Keep the floor dry. Clean up any water that spills on the floor as soon as it happens. Remove soap buildup in the tub or shower regularly. Attach bath mats securely with double-sided non-slip rug tape. Do not have throw rugs and other things on the floor that can make you trip. What can I do in the bedroom? Use night lights. Make sure that you have a light by your bed that is easy to reach. Do not use any sheets or blankets that are too big for your bed. They should not hang down onto the floor. Have a firm chair that has side arms. You can use this for support while you get dressed. Do not have throw rugs and other things on the floor that can make you trip. What can I do in the kitchen? Clean up any spills right away. Avoid walking on wet floors. Keep items that you use a lot in easy-to-reach places. If you need to reach something above you, use a strong step stool that has a grab bar. Keep electrical cords out of the way. Do not use floor polish or wax that makes floors slippery. If you must use wax, use non-skid floor wax. Do not have throw rugs and other things on the floor that can make you trip. What can I do with my stairs? Do not leave any items on the stairs. Make sure that there are handrails on both sides of the stairs and use them. Fix handrails that are broken or loose. Make sure that handrails are as long as the stairways. Check any carpeting to make sure that it is firmly attached to the stairs. Fix any carpet that is loose or worn. Avoid having throw rugs at the top or bottom of the stairs. If you do have throw rugs, attach them to the floor with carpet tape. Make sure that you have a light switch at the top of the stairs and the bottom of the stairs. If you do not have them, ask someone to add them for you. What else can I do to help prevent falls? Wear shoes that: Do not have high heels. Have rubber bottoms. Are comfortable and fit you  well. Are closed at the toe. Do not wear sandals. If you use a stepladder: Make sure that it  is fully opened. Do not climb a closed stepladder. Make sure that both sides of the stepladder are locked into place. Ask someone to hold it for you, if possible. Clearly mark and make sure that you can see: Any grab bars or handrails. First and last steps. Where the edge of each step is. Use tools that help you move around (mobility aids) if they are needed. These include: Canes. Walkers. Scooters. Crutches. Turn on the lights when you go into a dark area. Replace any light bulbs as soon as they burn out. Set up your furniture so you have a clear path. Avoid moving your furniture around. If any of your floors are uneven, fix them. If there are any pets around you, be aware of where they are. Review your medicines with your doctor. Some medicines can make you feel dizzy. This can increase your chance of falling. Ask your doctor what other things that you can do to help prevent falls. This information is not intended to replace advice given to you by your health care provider. Make sure you discuss any questions you have with your health care provider. Document Released: 04/23/2009 Document Revised: 12/03/2015 Document Reviewed: 08/01/2014 Elsevier Interactive Patient Education  2017 [employer].

## 2023-02-22 ENCOUNTER — Telehealth: Payer: Self-pay | Admitting: *Deleted

## 2023-02-22 NOTE — Progress Notes (Signed)
  Care Coordination  Outreach Note  02/22/2023 Name: Alexander Donaldson MRN: [id number] DOB: [birth year]   Care Coordination Outreach Attempts: An unsuccessful telephone outreach was attempted today to offer the patient information about available care coordination services.  Follow Up Plan:  Additional outreach attempts will be made to offer the patient care coordination information and services.   Encounter Outcome:  No Answer  Burman Nieves, CCMA Care Coordination Care Guide Direct Dial: [phone]

## 2023-02-24 NOTE — Progress Notes (Signed)
  Care Coordination  Outreach Note  02/24/2023 Name: Alexander Donaldson MRN: [id number] DOB: 10/25/1933   Care Coordination Outreach Attempts: A second unsuccessful outreach was attempted today to offer the patient with information about available care coordination services.  Follow Up Plan:  Additional outreach attempts will be made to offer the patient care coordination information and services.   Encounter Outcome:  No Answer  Burman Nieves, CCMA Care Coordination Care Guide Direct Dial: [phone]

## 2023-02-24 NOTE — Progress Notes (Signed)
  Care Coordination   Note   02/24/2023 Name: Alexander Donaldson MRN: [id number] DOB: 08/10/1933  Alexander Donaldson is a 89 y.o. year old male who sees Doreene Nest, NP for primary care. I reached out to [name] by phone today to offer care coordination services.  Mr. Verzosa was given information about Care Coordination services today including:   The Care Coordination services include support from the care team which includes your Nurse Coordinator, Clinical Social Worker, or Pharmacist.  The Care Coordination team is here to help remove barriers to the health concerns and goals most important to you. Care Coordination services are voluntary, and the patient may decline or stop services at any time by request to their care team member.   Care Coordination Consent Status: Patient agreed to services and verbal consent obtained.   Follow up plan:  Telephone appointment with care coordination team member scheduled for:  02/28/2023  Encounter Outcome:  Pt. Scheduled from referral   Burman Nieves, [facility] Care Coordination Care Guide Direct Dial: [phone]

## 2023-02-28 ENCOUNTER — Ambulatory Visit: Payer: Self-pay | Admitting: *Deleted

## 2023-02-28 NOTE — Patient Outreach (Signed)
  Care Coordination   Initial Visit Note   02/28/2023 Name: Alexander Donaldson MRN: [id number] DOB: May 20, 1934  Alexander Donaldson is a 89 y.o. year old male who sees Alexander Nest, NP for primary care. I spoke with  Alexander Donaldson's spouse by phone today.  What matters to the patients health and wellness today?  Patient's spouse states that she is the main caregiver for patient. Patient receives 11 hours per week through the [place]. This is the maximum amount hours he can receive. Patient' spouse states that she has a supportive church family that assists with respite care when they are able, and is expecting her sister to move in with her in September to assist as well. Contact information also provided for the Family Caregiver support program to apply for additional respite hours-patient's spouse  agreeable to call the [facility] to discuss respite care options and any additional support options.   Goals Addressed             This Visit's Progress    Care coordination activities       Interventions Today    Flowsheet Row Most Recent Value  Chronic Disease   Chronic disease during today's visit Other  [alzheimer's Dementia]  General Interventions   General Interventions Discussed/Reviewed Level of Care  Durable Medical Equipment (DME) Other  [[facility] working on [facility] a walk in [facility], has a shower chair  commode chair, walker and wheelchair]  Level of Care Personal Care Services, Adult Daycare  [receives 11 hours of in home care through [facility] hours in the max approved Contact info provided to spouse to [facility] [place]-cargiver support program to apply for additional respite hours [phone]]  Applications Other  [Adult Day programs discussed, spouse has declined this option due to difficulty managing his ADL's independently]  Exercise Interventions   Exercise Discussed/Reviewed Exercise Discussed, Physical Activity   [patient states that she excrcises with patient at least every other day-moves his legs and arms to music]  Education Interventions   Applications Other  [Adult Day programs discussed, spouse has declined this option due to difficulty managing his ADL's independently]  Safety Interventions   Safety Discussed/Reviewed Safety Discussed  [Has alarm in the home, in the event patient wanders]              SDOH assessments and interventions completed:  Yes  SDOH Interventions Today    Flowsheet Row Most Recent Value  SDOH Interventions   Food Insecurity Interventions Intervention Not Indicated  Housing Interventions Intervention Not Indicated  Transportation Interventions Intervention Not Indicated        Care Coordination Interventions:  Yes, provided   Follow up plan: No further intervention required.   Encounter Outcome:  Pt. Visit Completed

## 2023-02-28 NOTE — Patient Instructions (Signed)
Visit Information  Thank you for taking time to visit with me today. Please don't hesitate to contact me if I can be of assistance to you.   Following are the goals we discussed today:  Please contact the [facility] to follow up on additional respite care options [phone]    Please call the care guide team at [phone] if you need to cancel or reschedule your appointment.   If you are experiencing a Mental Health or Behavioral Health Crisis or need someone to talk to, please call 911   Patient verbalizes understanding of instructions and care plan provided today and agrees to view in MyChart. Active MyChart status and patient understanding of how to access instructions and care plan via MyChart confirmed with patient.     No further follow up required: patient to contact this Child psychotherapist with any additional community resource needs  [place], [place] Clinical Social Worker  [phone]

## 2023-03-21 DIAGNOSIS — E119 Type 2 diabetes mellitus without complications: Secondary | ICD-10-CM | POA: Diagnosis not present

## 2023-03-21 DIAGNOSIS — I2510 Atherosclerotic heart disease of native coronary artery without angina pectoris: Secondary | ICD-10-CM | POA: Diagnosis not present

## 2023-03-21 DIAGNOSIS — I10 Essential (primary) hypertension: Secondary | ICD-10-CM | POA: Diagnosis not present

## 2023-03-21 DIAGNOSIS — E785 Hyperlipidemia, unspecified: Secondary | ICD-10-CM | POA: Diagnosis not present

## 2023-03-23 ENCOUNTER — Ambulatory Visit (INDEPENDENT_AMBULATORY_CARE_PROVIDER_SITE_OTHER): Payer: Medicare Other | Admitting: Internal Medicine

## 2023-03-23 ENCOUNTER — Encounter: Payer: Self-pay | Admitting: Internal Medicine

## 2023-03-23 VITALS — BP 132/82 | HR 70 | Temp 97.9°F | Ht 64.0 in | Wt 158.0 lb

## 2023-03-23 DIAGNOSIS — Z20822 Contact with and (suspected) exposure to covid-19: Secondary | ICD-10-CM | POA: Diagnosis not present

## 2023-03-23 LAB — POC COVID19 BINAXNOW: SARS Coronavirus 2 Ag: NEGATIVE

## 2023-03-23 NOTE — Progress Notes (Signed)
Subjective:    Patient ID: Alexander Donaldson, male    DOB: 1934/02/17, 89 y.o.   MRN: [id number]  HPI Here due to COVID exposure and concern about possible infection With wife who provides the history  3 days ago--was close to woman who later was identified as COVID positive Was wearing mask---wife things No respiratory symptoms No fatigue  Current Outpatient Medications on File Prior to Visit  Medication Sig Dispense Refill   acetaminophen (TYLENOL) 500 MG tablet Take 500 mg by mouth every 6 (six) hours as needed for moderate pain.     amLODIPine Benzoate 1 MG/ML SUSP Take 10 mLs by mouth daily at 8 pm.     aspirin 81 MG tablet Take 81 mg by mouth daily.     Atorvastatin Calcium 20 MG/5ML SUSP Take 2 mLs by mouth daily at 8 pm.     clopidogrel (PLAVIX) 75 MG tablet Take 75 mg by mouth daily.  3   diclofenac sodium (VOLTAREN) 1 % GEL Apply 2 g topically 4 (four) times daily as needed (pain). 100 g 0   losartan (COZAAR) 100 MG tablet Take 100 mg by mouth daily.      Memantine HCl 10 MG/5ML SOLN Take 5 mLs by mouth 2 (two) times daily at 10 AM and 5 PM.     metoprolol tartrate (LOPRESSOR) 25 MG tablet Take 25 mg by mouth 2 (two) times daily.     Multiple Vitamin (MULTIVITAMIN) tablet Take 1 tablet by mouth daily.     nitroGLYCERIN (NITROSTAT) 0.4 MG SL tablet Place 1 tablet (0.4 mg total) under the tongue every 5 (five) minutes x 3 doses as needed for chest pain. 25 tablet 3   psyllium (HYDROCIL/METAMUCIL) 95 % PACK Take 1 packet by mouth daily.     sitaGLIPtin (JANUVIA) 100 MG tablet Take 100 mg by mouth daily.     No current facility-administered medications on file prior to visit.    Allergies  Allergen Reactions   Lisinopril     Other reaction(s): Cough   Metformin Diarrhea    Past Medical History:  Diagnosis Date   Acute encephalopathy 02/12/2018   Alzheimer's dementia (HCC)    CAD (coronary artery disease)    CAP (community acquired pneumonia) 02/11/2018   Carpal  tunnel syndrome, bilateral 02/25/2020   Coronary artery disease    Diabetes mellitus    Diverticulitis    Hearing loss    Hypertension    Hypokalemia 02/12/2018   NSTEMI (non-ST elevated myocardial infarction) [facility])     Past Surgical History:  Procedure Laterality Date   ANGIOPLASTY     CORONARY STENT INTERVENTION N/A 02/02/2017   Procedure: Coronary Stent Intervention;  Surgeon: Rinaldo Cloud, MD;  Location: [facility] INVASIVE CV LAB;  Service: Cardiovascular;  Laterality: N/A;   CORONARY STENTS     LEFT HEART CATH AND CORONARY ANGIOGRAPHY N/A 02/02/2017   Procedure: Left Heart Cath and Coronary Angiography;  Surgeon: Rinaldo Cloud, MD;  Location: [facility] INVASIVE CV LAB;  Service: Cardiovascular;  Laterality: N/A;   LEFT HEART CATHETERIZATION WITH CORONARY ANGIOGRAM N/A 03/16/2012   Procedure: LEFT HEART CATHETERIZATION WITH CORONARY ANGIOGRAM;  Surgeon: Robynn Pane, MD;  Location: [facility] CATH LAB;  Service: Cardiovascular;  Laterality: N/A;   PERCUTANEOUS CORONARY STENT INTERVENTION (PCI-S)  03/16/2012   Procedure: PERCUTANEOUS CORONARY STENT INTERVENTION (PCI-S);  Surgeon: Robynn Pane, MD;  Location: [facility] CATH LAB;  Service: Cardiovascular;;   TONSILLECTOMY      History reviewed. No pertinent family  history.  Social History   Socioeconomic History   Marital status: Married    Spouse name: Clydie Braun   Number of children: Not on file   Years of education: Not on file   Highest education level: Not on file  Occupational History   Not on file  Tobacco Use   Smoking status: Never   Smokeless tobacco: Never  Vaping Use   Vaping status: Never Used  Substance and Sexual Activity   Alcohol use: No    Comment: social   Drug use: No   Sexual activity: Yes    Birth control/protection: None  Other Topics Concern   Not on file  Social History Narrative   Lives with wife   Right Handed   Drinks 1-2 cups caffeine daily   Social Determinants of Health   Financial Resource Strain: Low Risk   (03/23/2023)   Overall Financial Resource Strain (CARDIA)    Difficulty of Paying Living Expenses: Not hard at all  Food Insecurity: No Food Insecurity (03/23/2023)   Hunger Vital Sign    Worried About Running Out of Food in the Last Year: Never true    Ran Out of Food in the Last Year: Never true  Transportation Needs: No Transportation Needs (03/23/2023)   PRAPARE - Administrator, Civil Service (Medical): No    Lack of Transportation (Non-Medical): No  Physical Activity: Unknown (03/23/2023)   Exercise Vital Sign    Days of Exercise per Week: Patient declined    Minutes of Exercise per Session: 0 min  Recent Concern: Physical Activity - Inactive (02/20/2023)   Exercise Vital Sign    Days of Exercise per Week: 0 days    Minutes of Exercise per Session: 0 min  Stress: Patient Declined (03/23/2023)   [employer] of Occupational Health - Occupational Stress Questionnaire    Feeling of Stress : Patient declined  Social Connections: Socially Integrated (03/23/2023)   Social Connection and Isolation Panel [NHANES]    Frequency of Communication with Friends and Family: Twice a week    Frequency of Social Gatherings with Friends and Family: Once a week    Attends Religious Services: More than 4 times per year    Active Member of [employer] or Organizations: Yes    Attends Banker Meetings: Never    Marital Status: Married  Catering manager Violence: Not At Risk (02/20/2023)   Humiliation, Afraid, Rape, and Kick questionnaire    Fear of Current or Ex-Partner: No    Emotionally Abused: No    Physically Abused: No    Sexually Abused: No   Review of Systems No loss of taste of smell Eating well    Objective:   Physical Exam Constitutional:      Appearance: Normal appearance.  HENT:     Mouth/Throat:     Pharynx: No oropharyngeal exudate or posterior oropharyngeal erythema.  Cardiovascular:     Rate and Rhythm: Normal rate and regular rhythm.     Heart sounds:  No murmur heard.    No gallop.  Pulmonary:     Effort: Pulmonary effort is normal.     Breath sounds: Normal breath sounds. No wheezing or rales.  Musculoskeletal:     Cervical back: Neck supple.  Lymphadenopathy:     Cervical: No cervical adenopathy.  Neurological:     Mental Status: He is alert.            Assessment & Plan:

## 2023-03-23 NOTE — Assessment & Plan Note (Signed)
No clear symptoms Test is negative Reassured--discussed masking, etc

## 2023-03-24 ENCOUNTER — Other Ambulatory Visit: Payer: Self-pay

## 2023-03-24 ENCOUNTER — Emergency Department (HOSPITAL_COMMUNITY): Payer: No Typology Code available for payment source

## 2023-03-24 ENCOUNTER — Encounter (HOSPITAL_COMMUNITY): Payer: Self-pay

## 2023-03-24 ENCOUNTER — Emergency Department (HOSPITAL_COMMUNITY)
Admission: EM | Admit: 2023-03-24 | Discharge: 2023-03-24 | Disposition: A | Discharge status: Home / Self Care | Payer: No Typology Code available for payment source | Attending: Emergency Medicine | Admitting: Emergency Medicine

## 2023-03-24 DIAGNOSIS — F0390 Unspecified dementia without behavioral disturbance: Secondary | ICD-10-CM | POA: Insufficient documentation

## 2023-03-24 DIAGNOSIS — Z79899 Other long term (current) drug therapy: Secondary | ICD-10-CM | POA: Insufficient documentation

## 2023-03-24 DIAGNOSIS — I10 Essential (primary) hypertension: Secondary | ICD-10-CM | POA: Insufficient documentation

## 2023-03-24 DIAGNOSIS — R109 Unspecified abdominal pain: Secondary | ICD-10-CM

## 2023-03-24 DIAGNOSIS — R1030 Lower abdominal pain, unspecified: Secondary | ICD-10-CM | POA: Insufficient documentation

## 2023-03-24 DIAGNOSIS — Z7982 Long term (current) use of aspirin: Secondary | ICD-10-CM | POA: Insufficient documentation

## 2023-03-24 DIAGNOSIS — N4889 Other specified disorders of penis: Secondary | ICD-10-CM | POA: Insufficient documentation

## 2023-03-24 DIAGNOSIS — R1084 Generalized abdominal pain: Secondary | ICD-10-CM | POA: Diagnosis not present

## 2023-03-24 LAB — COMPREHENSIVE METABOLIC PANEL
ALT: 11 U/L (ref 0–44)
AST: 19 U/L (ref 15–41)
Albumin: 3.8 g/dL (ref 3.5–5.0)
Alkaline Phosphatase: 52 U/L (ref 38–126)
Anion gap: 9 (ref 5–15)
BUN: 16 mg/dL (ref 8–23)
CO2: 27 mmol/L (ref 22–32)
Calcium: 8.5 mg/dL — ABNORMAL LOW (ref 8.9–10.3)
Chloride: 100 mmol/L (ref 98–111)
Creatinine, Ser: 1.12 mg/dL (ref 0.61–1.24)
GFR, Estimated: >60 mL/min (ref >60)
Glucose, Bld: 137 mg/dL — ABNORMAL HIGH (ref 70–99)
Potassium: 3.7 mmol/L (ref 3.5–5.1)
Sodium: 136 mmol/L (ref 135–145)
Total Bilirubin: 1.1 mg/dL (ref 0.3–1.2)
Total Protein: 6.8 g/dL (ref 6.5–8.1)

## 2023-03-24 LAB — CBC WITH DIFFERENTIAL/PLATELET
Abs Immature Granulocytes: 0.01 10*3/uL (ref 0.00–0.07)
Basophils Absolute: 0 10*3/uL (ref 0.0–0.1)
Basophils Relative: 1 %
Eosinophils Absolute: 0.3 10*3/uL (ref 0.0–0.5)
Eosinophils Relative: 5 %
HCT: 41.7 % (ref 39.0–52.0)
Hemoglobin: 14.3 g/dL (ref 13.0–17.0)
Immature Granulocytes: 0 %
Lymphocytes Relative: 49 %
Lymphs Abs: 3 10*3/uL (ref 0.7–4.0)
MCH: 30.8 pg (ref 26.0–34.0)
MCHC: 34.3 g/dL (ref 30.0–36.0)
MCV: 89.9 fL (ref 80.0–100.0)
Monocytes Absolute: 0.6 10*3/uL (ref 0.1–1.0)
Monocytes Relative: 10 %
Neutro Abs: 2.1 10*3/uL (ref 1.7–7.7)
Neutrophils Relative %: 35 %
Platelets: 163 10*3/uL (ref 150–400)
RBC: 4.64 MIL/uL (ref 4.22–5.81)
RDW: 12.3 % (ref 11.5–15.5)
WBC: 6 10*3/uL (ref 4.0–10.5)
nRBC: 0 % (ref 0.0–0.2)

## 2023-03-24 LAB — URINALYSIS, W/ REFLEX TO CULTURE (INFECTION SUSPECTED)
Bacteria, UA: NONE SEEN
Bilirubin Urine: NEGATIVE
Glucose, UA: 50 mg/dL — AB
Hgb urine dipstick: NEGATIVE
Ketones, ur: NEGATIVE mg/dL
Leukocytes,Ua: NEGATIVE
Nitrite: NEGATIVE
Protein, ur: NEGATIVE mg/dL
Specific Gravity, Urine: 1.004 — ABNORMAL LOW (ref 1.005–1.030)
pH: 7 (ref 5.0–8.0)

## 2023-03-24 MED ORDER — ACETAMINOPHEN 325 MG PO TABS
650.0000 mg | ORAL_TABLET | Freq: Once | ORAL | Status: AC
  Administered 2023-03-24: 650 mg via ORAL
  Filled 2023-03-24: qty 2

## 2023-03-24 MED ORDER — IOHEXOL 350 MG/ML SOLN
95.0000 mL | Freq: Once | INTRAVENOUS | Status: AC | PRN
  Administered 2023-03-24: 95 mL via INTRAVENOUS

## 2023-03-24 NOTE — ED Triage Notes (Signed)
Patient BIB [facility] from home due to abdominal pain and HTN. Patient wife called out for abdominal pain, upon EMS arrival patient had no complaints of abdominal pain. At home BP 190/82, with EMS 180/80. Patient takes BP medication at home. Patient has hx of dementia. Patient is alert.

## 2023-03-24 NOTE — ED Notes (Signed)
Patient returned from CT

## 2023-03-24 NOTE — ED Provider Notes (Signed)
  Physical Exam  BP (!) 167/81   Pulse 63   Temp 97.6 F (36.4 C)   Resp (!) 22   Ht 5\' 4"  (1.626 m)   Wt 71 kg   SpO2 98%   BMI 26.87 kg/m   Physical Exam Constitutional:      General: He is not in acute distress.    Appearance: Normal appearance.  HENT:     Head: Normocephalic and atraumatic.     Nose: No congestion or rhinorrhea.  Eyes:     General:        Right eye: No discharge.        Left eye: No discharge.     Extraocular Movements: Extraocular movements intact.     Pupils: Pupils are equal, round, and reactive to light.  Cardiovascular:     Rate and Rhythm: Normal rate and regular rhythm.     Heart sounds: No murmur heard. Pulmonary:     Effort: No respiratory distress.     Breath sounds: No wheezing or rales.  Abdominal:     General: There is no distension.     Tenderness: There is no abdominal tenderness.  Musculoskeletal:        General: Normal range of motion.     Cervical back: Normal range of motion.  Skin:    General: Skin is warm and dry.  Neurological:     General: No focal deficit present.     Mental Status: He is alert. He is disoriented.     Procedures  Procedures  ED Course / MDM    Medical Decision Making Amount and/or Complexity of Data Reviewed Labs: ordered. Radiology: ordered.  Risk OTC drugs. Prescription drug management.   Patient received in handoff.  Abdominal pain pending CT abdomen pelvis.  CT abdomen pelvis reassuringly unremarkable for acute pathology.  On my reevaluation, patient had pulled off his condom catheter and urinated on the bed.  I performed a bedside ultrasound to evaluate for postvoid residual and patient is successfully emptying his bladder without difficulty.  At this time he does not meet inpatient criteria for admission and he is safe for discharge with outpatient follow-up.       Glendora Score, MD 03/24/23 [phone]

## 2023-03-24 NOTE — ED Notes (Signed)
Patient transported to CT 

## 2023-03-24 NOTE — ED Provider Notes (Signed)
[facility] EMERGENCY DEPARTMENT AT [facility] Provider Note   CSN: [id number] Arrival date & time: 03/24/23  0406     History  Chief Complaint  Patient presents with   Hypertension    Alexander Donaldson is a 89 y.o. male.  The history is provided by the patient, the EMS personnel, medical records and the spouse.  Hypertension  Alexander Donaldson is a 89 y.o. male who presents to the Emergency Department complaining of abdominal pain.  He presents to the emergency department by EMS for evaluation of abdominal pain that started tonight.  Level 5 caveat due to dementia.  History is provided by EMS and the patient's spouse.  This evening he developed abdominal pain and EMS was called.  On their arrival he denied any complaints.  At time of ED arrival he complains of pain to his penis.  He denies any additional complaints.  EMS reports elevated blood pressure.     Home Medications Prior to Admission medications   Medication Sig Start Date End Date Taking? Authorizing Provider  acetaminophen (TYLENOL) 500 MG tablet Take 500 mg by mouth every 6 (six) hours as needed for moderate pain.    [provider]  amLODIPine Benzoate 1 MG/ML SUSP Take 10 mLs by mouth daily at 8 pm. 12/22/22   [provider]  aspirin 81 MG tablet Take 81 mg by mouth daily.    [provider]  Atorvastatin Calcium 20 MG/5ML SUSP Take 2 mLs by mouth daily at 8 pm. 12/21/22   [provider]  clopidogrel (PLAVIX) 75 MG tablet Take 75 mg by mouth daily. 01/02/17   [provider]  diclofenac sodium (VOLTAREN) 1 % GEL Apply 2 g topically 4 (four) times daily as needed (pain). 11/01/18   Melene Plan, DO  losartan (COZAAR) 100 MG tablet Take 100 mg by mouth daily.     [provider]  Memantine HCl 10 MG/5ML SOLN Take 5 mLs by mouth 2 (two) times daily at 10 AM and 5 PM. 12/26/22   [provider]  metoprolol tartrate (LOPRESSOR) 25 MG tablet Take 25 mg by  mouth 2 (two) times daily.    [provider]  Multiple Vitamin (MULTIVITAMIN) tablet Take 1 tablet by mouth daily.    [provider]  nitroGLYCERIN (NITROSTAT) 0.4 MG SL tablet Place 1 tablet (0.4 mg total) under the tongue every 5 (five) minutes x 3 doses as needed for chest pain. 03/18/12   Rinaldo Cloud, MD  psyllium (HYDROCIL/METAMUCIL) 95 % PACK Take 1 packet by mouth daily.    [provider]  sitaGLIPtin (JANUVIA) 100 MG tablet Take 100 mg by mouth daily.    [provider]      Allergies    Lisinopril and Metformin    Review of Systems   Review of Systems  All other systems reviewed and are negative.   Physical Exam Updated Vital Signs BP (!) 172/96   Pulse 74   Temp 97.7 F (36.5 C) (Oral)   Resp 17   Ht 5\' 4"  (1.626 m)   Wt 71 kg   SpO2 100%   BMI 26.87 kg/m  Physical Exam Vitals and nursing note reviewed.  Constitutional:      Appearance: He is well-developed.  HENT:     Head: Normocephalic and atraumatic.  Cardiovascular:     Rate and Rhythm: Normal rate and regular rhythm.  Pulmonary:     Effort: Pulmonary effort is normal. No respiratory  distress.  Abdominal:     Palpations: Abdomen is soft.     Tenderness: There is no abdominal tenderness. There is no guarding or rebound.  Genitourinary:    Penis: Normal.      Testes: Normal.  Musculoskeletal:        General: No tenderness.  Skin:    General: Skin is warm and dry.  Neurological:     Mental Status: He is alert.     Comments: Disoriented to time and recent events.  Moves all extremities symmetrically  Psychiatric:        Behavior: Behavior normal.     ED Results / Procedures / Treatments   Labs (all labs ordered are listed, but only abnormal results are displayed) Labs Reviewed  COMPREHENSIVE METABOLIC PANEL  CBC WITH DIFFERENTIAL/PLATELET  URINALYSIS, W/ REFLEX TO CULTURE (INFECTION SUSPECTED)    EKG None  Radiology No results  found.  Procedures Procedures    Medications Ordered in ED Medications  acetaminophen (TYLENOL) tablet 650 mg (650 mg Oral Given 03/24/23 0435)    ED Course/ Medical Decision Making/ A&P                                 Medical Decision Making Amount and/or Complexity of Data Reviewed Labs: ordered. Radiology: ordered.  Risk OTC drugs. Prescription drug management.   Patient with history of dementia here for evaluation of lower abdominal pain that started around 2 AM.  Abdomen is soft is nontender.  He does point to his penis when he mentions his pain.  GU exam without any evidence of hernias, wounds or evidence of infection.  UA is not consistent with UTI.  Patient care transferred pending CT abdomen pelvis.        Final Clinical Impression(s) / ED Diagnoses Final diagnoses:  None    Rx / DC Orders ED Discharge Orders     None         Tilden Fossa, MD 03/24/23 [phone]

## 2023-03-27 DIAGNOSIS — I255 Ischemic cardiomyopathy: Secondary | ICD-10-CM | POA: Diagnosis not present

## 2023-03-27 DIAGNOSIS — G459 Transient cerebral ischemic attack, unspecified: Secondary | ICD-10-CM | POA: Diagnosis not present

## 2023-03-27 DIAGNOSIS — E1143 Type 2 diabetes mellitus with diabetic autonomic (poly)neuropathy: Secondary | ICD-10-CM | POA: Diagnosis not present

## 2023-03-27 DIAGNOSIS — I2510 Atherosclerotic heart disease of native coronary artery without angina pectoris: Secondary | ICD-10-CM | POA: Diagnosis not present

## 2023-04-18 DIAGNOSIS — Z23 Encounter for immunization: Secondary | ICD-10-CM | POA: Diagnosis not present

## 2023-04-26 ENCOUNTER — Telehealth: Payer: Self-pay

## 2023-04-26 NOTE — Telephone Encounter (Signed)
Transition Care Management Unsuccessful Follow-up Telephone Call  Date of discharge and from where:  03/24/2023 [facility]  Attempts:  1st Attempt  Reason for unsuccessful TCM follow-up call:  Left voice message  Madigan Rosensteel [facility]  [facility], [facility] Resource Care Guide Direct Dial: [phone]  Website: [email or web address]

## 2023-04-27 ENCOUNTER — Telehealth: Payer: Self-pay

## 2023-04-27 NOTE — Telephone Encounter (Signed)
Transition Care Management Unsuccessful Follow-up Telephone Call  Date of discharge and from where:  03/24/2023 [facility]  Attempts:  2nd Attempt  Reason for unsuccessful TCM follow-up call:  No answer/busy  Keara Pagliarulo [facility]  [facility], [facility] Resource Care Guide Direct Dial: [phone]  Website: [email or web address]

## 2023-05-17 ENCOUNTER — Encounter: Payer: Self-pay | Admitting: Podiatry

## 2023-05-17 ENCOUNTER — Ambulatory Visit: Payer: Medicare Other | Admitting: Podiatry

## 2023-05-17 DIAGNOSIS — M79675 Pain in left toe(s): Secondary | ICD-10-CM | POA: Diagnosis not present

## 2023-05-17 DIAGNOSIS — E1159 Type 2 diabetes mellitus with other circulatory complications: Secondary | ICD-10-CM | POA: Diagnosis not present

## 2023-05-17 DIAGNOSIS — M79674 Pain in right toe(s): Secondary | ICD-10-CM | POA: Diagnosis not present

## 2023-05-17 DIAGNOSIS — B351 Tinea unguium: Secondary | ICD-10-CM | POA: Diagnosis not present

## 2023-05-17 NOTE — Progress Notes (Signed)
  Subjective:  Patient ID: Alexander Donaldson, male    DOB: 1934/04/09,  MRN: [id number]  89 y.o. male presents to clinic today with at risk foot care. Pt has h/o NIDDM with PAD and painful elongated mycotic toenails 1-5 bilaterally which are tender when wearing enclosed shoe gear. Pain is relieved with periodic professional debridement.  Patient has h/o dementia and is accompanied by his wife on today's visit. Chief Complaint  Patient presents with   Diabetes    DFC BS - 121  A1C - 7.2 LVPCP - 12/2022    Patient has h/o dementia and is accompanied by his wife on today's visit.  New pedal problem(s): None   PCP is Doreene Nest, NP.  Allergies  Allergen Reactions   Lisinopril     Other reaction(s): Cough   Metformin Diarrhea    Review of Systems: Negative except as noted in the HPI.   Objective:  Alexander Donaldson is a pleasant 89 y.o. male WD, WN in NAD. AAO x 2.  Vascular Examination: Faintly palpable DP pulses; nonpalpable PT pulses. CFT <3 seconds b/l. No edema. No pain with calf compression b/l. Skin temperature gradient WNL b/l. No cyanosis or clubbing noted b/l LE.  Neurological Examination: Patient unable to complete sensory examination due to cognition but does respond to external noxious stimuli.   Pt has subjective symptoms of neuropathy.  Dermatological Examination: Pedal skin with normal turgor, texture and tone b/l. Toenails 1-5 b/l thick, discolored, elongated with subungual debris and pain on dorsal palpation. No hyperkeratotic lesions noted b/l.   Musculoskeletal Examination: Muscle strength 5/5 to RLE. Dropfoot left foot. Hammertoe deformity noted 2-5 b/l.  Radiographs: None  Assessment:   1. Pain due to onychomycosis of toenails of both feet   2. Type 2 diabetes mellitus with vascular disease (HCC)    Plan:  Patient was evaluated and treated. All patient's and/or POA's questions/concerns addressed on today's visit. Mycotic toenails 1-5 debrided in  length and girth without incident. Continue soft, supportive shoe gear daily. Report any pedal injuries to medical professional. Call office if there are any quesitons/concerns. -Patient/POA to call should there be question/concern in the interim.  Return in about 3 months (around 08/17/2023).  Freddie Breech, DPM

## 2023-06-19 ENCOUNTER — Telehealth: Payer: Self-pay | Admitting: Neurology

## 2023-06-19 NOTE — Telephone Encounter (Signed)
Wife called to confirm appointment date and time.

## 2023-06-22 ENCOUNTER — Encounter: Payer: Self-pay | Admitting: Neurology

## 2023-06-22 ENCOUNTER — Ambulatory Visit: Payer: Medicare Other | Admitting: Neurology

## 2023-06-22 VITALS — BP 113/98 | HR 88 | Ht 64.0 in | Wt 160.0 lb

## 2023-06-22 DIAGNOSIS — G301 Alzheimer's disease with late onset: Secondary | ICD-10-CM

## 2023-06-22 DIAGNOSIS — F02818 Dementia in other diseases classified elsewhere, unspecified severity, with other behavioral disturbance: Secondary | ICD-10-CM | POA: Diagnosis not present

## 2023-06-22 DIAGNOSIS — G5603 Carpal tunnel syndrome, bilateral upper limbs: Secondary | ICD-10-CM

## 2023-06-22 MED ORDER — MEMANTINE HCL 10 MG/5ML PO SOLN: 5.0000 mL | Freq: Two times a day (BID) | ORAL | 3 refills | Status: AC

## 2023-06-22 NOTE — Progress Notes (Signed)
[facility] [address] [place]. [place] [address] [phone]       OFFICE FOLLOW UP VISIT NOTE  [name] Date of Birth:  1933/11/29 Medical Record Number:  [id number]   Referring MD: Sammuel Cooper, NP Reason for Referral: Hand numbness HPI: Initial visit 01/20/2020: Alexander Donaldson is a pleasant 89 year old African-American male with past medical history of diabetes, hypertension, diverticulitis, coronary artery disease who has been complaining of bilateral hand numbness for the last 6 months.  Reports it is intermittently but most prominently at night or early in the morning when he wakes up with his right thumb and index finger feeling stiff and numb.  He feels his strength is good and he can still hold objects very well but his fingers feel different.  This is often severe at night and he often wakes up with this.  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  He has not been wearing any wrist splints.  Denies any neck pain, radicular pain, gait or balance problems.  Review of electronic medical records show that he had lab work on 11/28/2019 which include vitamin B12 which was 284 mg percent and hemoglobin A1c was elevated at 8.5.  Comprehensive metabolic panel labs were normal.  TSH was 1.91 on 01/22/2019.  She has not had any x-rays of his C-spine, MRI or EMG nerve conduction studies done. Update 03/31/2020: He returns for follow-up after last visit 2 months ago.  He continues to have weakness in his hand but states that the numbness is only in the index and middle fingertips and is not as bothersome.  EMG nerve conduction study done by Dr. Anne Hahn on 02/25/2020 confirmed severe end-stage carpal tunnel bilaterally right more than left with wasting and no activity in the right abductor pollicis on stimulation.  There is also chronic stable overlying C5 radiculopathy.  Patient's wife reports  today that for the last year or so she is noted progressive cognitive decline.  Recent short-term memory difficulties.  Is also had some behavioral changes and gets irritable and agitated easily.  Is quite forgetful.  Will not follow multistage commands and often gets distracted easily.  Also cannot have a proper conversation at times gets sidetracked.  She has been doing and more things for him.  Patient has no family history of Alzheimer's.  He has no prior history of strokes, TIA, seizures or head injury with loss of consciousness.  He has not had any recent brain imaging done. Update 09/16/2020: He returns for follow-up after last visit 6 months ago.  He is accompanied by his wife.  Patient had lab work at last visit for reversible causes of memory loss which was all normal.  Patient refused to undergo EEG and MRI which were ordered.  He did try the Namenda but was not able to tolerate 5 mg twice daily and developed significant sleepiness and hence it was stopped after a few weeks.  He continues to have significant memory and cognitive difficulties.  He requires constant supervision and his wife is around him most of the time.  Patient did see Dr. Franky Macho for his severe carpal tunnel with he refused to have surgery.  Continues to have numbness and tingling in his hands intermittently but still has good strength.  Patient of likely had an MRI done at the [place] in August 2020 and his wife  states she is trying to get me the report of the disc to review. Update 05/24/2021 : He returns for follow-up after last visit 8 months ago.  Is accompanied by his wife.  Patient continues to have cognitive decline.  He now cannot be left alone.  Patient has benefits through the [place] and now has 20 hours a week of sitter at home with wife gets some relief.  Patient was not able to tolerate Aricept due to side effects and it was discontinued.  Patient medications are supervised by the wife.  He is confused at baseline and disoriented.   He can follow directions and can be redirected.  He ambulates independently.  There is no delusions hallucinations, unsafe behavior or violent behavior noted.  He had EEG done on 11/12/2020 which was normal.  He was seen briefly in the ER on 01/06/2021 for transient confusion.  He has no new complaints today.  He still has not been complaining of numbness or pain in his hand.  His carpal tunnel symptoms seem to be stable and not bothersome at the moment. Update 09/29/2021 : He returns for follow-up after last visit 4 months ago.  He is accompanied by his wife.  He seems to be tolerating Namenda better than he did Aricept.  Wife has noticed slight improvement in his cognition and times he speaks better and makes more sense.  Short-term memory also at times seems improved.  Patient continues to talk a lot about his past and has trouble making new information or retaining information.  He is mostly calm though occasionally gets irritated.  He denies any delirium, hallucinations or violent behavior.  He now has a caregiver support at home for 20 hours/week through the [place] system.  His children also help out.  His primary care physician recently started new diabetic medication.  He had lab work done at the [facility] on 08/19/2021 and LDL cholesterol was 103 mg percent and hemoglobin A1c was 7.6. Update 07/05/2022 : He returns for follow-up after last visit 9 months ago.  He is accompanied by his wife who provides most of the history.  Patient continues to have significant cognitive impairment and needs 24-hour supervision.  He is now beginning to forget people's names or he can recognize her faces.  He often forgets his wife's name.  He remains on Namenda which is tolerating well without side effects.  Patient can get up by himself and go to the restroom is never left alone at home.  Will get respite care few days a week at home.  Patient's wife is the sole caregiver but gets some help from her children.  Patient is  confused off-and-on mostly at night with sundowning.  He gets vivid dreams.  Does not have delusions, hallucinations, agitation.  He has not exhibited violent or unsafe behavior.  Can ambulate safely with good balance and no falls or injuries.  He is complaining of mild posterior neck pain particularly when he turns his neck to the left.  He has mild tenderness of the spine on the left.  He denies any radicular pain and fall injury to the neck.  He was admitted to the hospital on 01/29/2022 for 3 days for NSTEMI and treated medically and his chest pain resolved. Update 06/22/2023 : He returns for follow-up after last visit a year ago.  He is accompanied by his wife.  Continues to have significant cognitive impairment and dementia and requires 24-hour supervision.  He is tolerating Namenda but has  not switched to the liquid form which is easier for him.  He can ambulate independently with a walker.  He is never left alone.  The wife is getting some help at home and is trying to apply to get more help.  Patient has not had any agitated behavior, delusions or hallucinations.  He is easy to redirect.  Behavior is mostly pleasant and cooperative.  We attempted to do Mini-Mental status exam today but he scored 0#.  Unable to name any animals or draw a clock. ROS:   14 system review of systems is positive for memory loss, confusion hand numbness, pain, decreased hearing only and all other systems negative  PMH:  Past Medical History:  Diagnosis Date   Acute encephalopathy 02/12/2018   Alzheimer's dementia (HCC)    CAD (coronary artery disease)    CAP (community acquired pneumonia) 02/11/2018   Carpal tunnel syndrome, bilateral 02/25/2020   Coronary artery disease    Diabetes mellitus    Diverticulitis    Hearing loss    Hypertension    Hypokalemia 02/12/2018   NSTEMI (non-ST elevated myocardial infarction) (HCC)     Social History:  Social History   Socioeconomic History   Marital status: Married     Spouse name: Clydie Braun   Number of children: Not on file   Years of education: Not on file   Highest education level: Not on file  Occupational History   Not on file  Tobacco Use   Smoking status: Never   Smokeless tobacco: Never  Vaping Use   Vaping status: Never Used  Substance and Sexual Activity   Alcohol use: No    Comment: social   Drug use: No   Sexual activity: Yes    Birth control/protection: None  Other Topics Concern   Not on file  Social History Narrative   Lives with wife   Right Handed   Drinks 1-2 cups caffeine daily   Social Drivers of Health   Financial Resource Strain: Low Risk  (03/23/2023)   Overall Financial Resource Strain (CARDIA)    Difficulty of Paying Living Expenses: Not hard at all  Food Insecurity: No Food Insecurity (03/23/2023)   Hunger Vital Sign    Worried About Running Out of Food in the Last Year: Never true    Ran Out of Food in the Last Year: Never true  Transportation Needs: No Transportation Needs (03/23/2023)   PRAPARE - Administrator, Civil Service (Medical): No    Lack of Transportation (Non-Medical): No  Physical Activity: Unknown (03/23/2023)   Exercise Vital Sign    Days of Exercise per Week: Patient declined    Minutes of Exercise per Session: 0 min  Recent Concern: Physical Activity - Inactive (02/20/2023)   Exercise Vital Sign    Days of Exercise per Week: 0 days    Minutes of Exercise per Session: 0 min  Stress: Patient Declined (03/23/2023)   [employer] of Occupational Health - Occupational Stress Questionnaire    Feeling of Stress : Patient declined  Social Connections: Socially Integrated (03/23/2023)   Social Connection and Isolation Panel [NHANES]    Frequency of Communication with Friends and Family: Twice a week    Frequency of Social Gatherings with Friends and Family: Once a week    Attends Religious Services: More than 4 times per year    Active Member of [employer] or Organizations: Yes    Attends  Banker Meetings: Never    Marital Status: Married  Intimate Partner Violence: Not At Risk (02/20/2023)   Humiliation, Afraid, Rape, and Kick questionnaire    Fear of Current or Ex-Partner: No    Emotionally Abused: No    Physically Abused: No    Sexually Abused: No    Medications:   Current Outpatient Medications on File Prior to Visit  Medication Sig Dispense Refill   acetaminophen (TYLENOL) 500 MG tablet Take 500 mg by mouth every 6 (six) hours as needed for moderate pain.     amLODIPine Benzoate 1 MG/ML SUSP Take 10 mLs by mouth daily at 8 pm.     aspirin 81 MG tablet Take 81 mg by mouth daily.     Atorvastatin Calcium 20 MG/5ML SUSP Take 2 mLs by mouth daily at 8 pm.     clopidogrel (PLAVIX) 75 MG tablet Take 75 mg by mouth daily.  3   diclofenac sodium (VOLTAREN) 1 % GEL Apply 2 g topically 4 (four) times daily as needed (pain). 100 g 0   losartan (COZAAR) 100 MG tablet Take 100 mg by mouth daily.      Memantine HCl 10 MG/5ML SOLN Take 5 mLs by mouth 2 (two) times daily at 10 AM and 5 PM.     Multiple Vitamin (MULTIVITAMIN) tablet Take 1 tablet by mouth daily.     nitroGLYCERIN (NITROSTAT) 0.4 MG SL tablet Place 1 tablet (0.4 mg total) under the tongue every 5 (five) minutes x 3 doses as needed for chest pain. 25 tablet 3   psyllium (HYDROCIL/METAMUCIL) 95 % PACK Take 1 packet by mouth daily.     sitaGLIPtin (JANUVIA) 100 MG tablet Take 100 mg by mouth daily.     No current facility-administered medications on file prior to visit.    Allergies:   Allergies  Allergen Reactions   Lisinopril     Other reaction(s): Cough   Metformin Diarrhea    Physical Exam General: well developed, well nourished elderly African-American male, seated, in no evident distress Head: head normocephalic and atraumatic.   Neck: supple with no carotid or supraclavicular bruits Cardiovascular: regular rate and rhythm, no murmurs Musculoskeletal: no deformity Skin:  no  rash/petichiae Vascular:  Normal pulses all extremities  Neurologic Exam Mental Status: Awake and fully alert. Oriented to place and time. Recent and remote memory diminished. Attention span, concentration and fund of knowledge poor. Mood and affect appropriate.  Mini-Mental status exam not completed today. (Last visit 13/30 with deficits in orientation, attention, calculation, recall and following three-step commands.)  Diminished recall 0/3.  Able to name only 0 animals which can walk on 4 legs.  Not cooperative for doing clock drawing or copying descending pentagons. Cranial Nerves: Fundoscopic exam not done pupils equal, briskly reactive to light. Extraocular movements full without nystagmus. Visual fields full to confrontation. Hearing diminished bilaterally facial sensation intact. Face, tongue, palate moves normally and symmetrically.  Motor: Normal bulk and tone. Normal strength in all tested extremity muscles.  Except wasting of both thenar eminences.  Mild weakness of abductor pollicis and extensor pollicis in the right hand. Sensory.:  diminished touch pinprick sensation in the right thumb and index finger only.  Positive weak Tinel sign on both wrists right greater than left.  Diminished vibration sensations over ankles bilaterally.  Romberg sign is weakly positive. Coordination: Rapid alternating movements normal in all extremities. Finger-to-nose and heel-to-shin performed accurately bilaterally. Gait and Station: Arises from chair without difficulty. Stance is normal. Gait demonstrates normal stride length and balance . Able to heel, toe  and tandem walk with great difficulty.  Reflexes: 1+ and symmetric in upper extremities and both knee and ankle jerks are absent.. Toes downgoing.        06/22/2023    2:49 PM 03/31/2020    1:27 PM  MMSE - Mini Mental State Exam  Orientation to time 0 2  Orientation to Place 0 1  Registration 0 3  Attention/ Calculation 0 0  Recall 0 0  Language-  name 2 objects 0 2  Language- repeat 0 1  Language- follow 3 step command 0 2  Language- read & follow direction 0 0  Write a sentence 0 1  Copy design 0 1  Copy design-comments  3 animals  Total score 0 13      ASSESSMENT: 89 year old African-American male with bilateral hand right greater than left weakness secondary to severe end-stage carpal tunnel syndrome.  Cervical radiculopathy and underlying diabtic neuropathy felt to be less likely.  Progressive subacute cognitive and memory decline likely due to advanced Alzheimer's .  He has not been able to tolerate Aricept due to side effects but seems somewhat doing better on Namenda    PLAN: I had a long discussion with the patient and his wife regarding his progressive memory and cognitive worsening from dementia which seems somewhat stable after starting Namenda.  Continue Namenda 10 mg twice daily which is tolerating well.  Continue 24-hour supervision at home and if his condition worsens he may need to look into moving into memory care unit or nursing home.     Return for follow-up in the future in 1 year or call earlier if necessary.  His carpal tunnel symptoms seem quite stable at the present time hence recommend conservative follow-up for that.  Marland Kitchen  .Greater than 50% time during this 35-minute   visit was spent on counseling and coordination of care about his hand numbness and carpal tunnel as well as memory loss and diagnosis of Alzheimer's and answering questions.   Delia Heady, MD  [facility] [address] [place], [place] [address]  Phone [phone] Fax [phone] Note: This document was prepared with digital dictation and possible smart phrase technology. Any transcriptional errors that result from this process are unintentional.

## 2023-06-22 NOTE — Patient Instructions (Signed)
I had a long discussion with the patient and his wife regarding his progressive memory and cognitive worsening from dementia which seems somewhat stable after starting Namenda.  Continue Namenda 10 mg twice daily which is tolerating well.  Continue 24-hour supervision at home and if his condition worsens he may need to look into moving into memory care unit or nursing home.     Return for follow-up in the future in 1 year or call earlier if necessary.  His carpal tunnel symptoms seem quite stable at the present time hence recommend conservative follow-up for that.

## 2023-06-26 DIAGNOSIS — I2510 Atherosclerotic heart disease of native coronary artery without angina pectoris: Secondary | ICD-10-CM | POA: Diagnosis not present

## 2023-06-26 DIAGNOSIS — I10 Essential (primary) hypertension: Secondary | ICD-10-CM | POA: Diagnosis not present

## 2023-06-26 DIAGNOSIS — I255 Ischemic cardiomyopathy: Secondary | ICD-10-CM | POA: Diagnosis not present

## 2023-06-26 DIAGNOSIS — E1169 Type 2 diabetes mellitus with other specified complication: Secondary | ICD-10-CM | POA: Diagnosis not present

## 2023-07-02 ENCOUNTER — Observation Stay (HOSPITAL_COMMUNITY): Payer: No Typology Code available for payment source

## 2023-07-02 ENCOUNTER — Inpatient Hospital Stay (HOSPITAL_COMMUNITY)
Admission: EM | Admit: 2023-07-02 | Discharge: 2023-07-04 | DRG: 281 | Disposition: A | Discharge status: Home / Self Care | Payer: No Typology Code available for payment source | Attending: Cardiovascular Disease | Admitting: Cardiovascular Disease

## 2023-07-02 ENCOUNTER — Other Ambulatory Visit: Payer: Self-pay

## 2023-07-02 ENCOUNTER — Emergency Department (HOSPITAL_COMMUNITY): Payer: No Typology Code available for payment source

## 2023-07-02 ENCOUNTER — Encounter (HOSPITAL_COMMUNITY): Payer: Self-pay

## 2023-07-02 DIAGNOSIS — E119 Type 2 diabetes mellitus without complications: Secondary | ICD-10-CM | POA: Diagnosis present

## 2023-07-02 DIAGNOSIS — I252 Old myocardial infarction: Secondary | ICD-10-CM | POA: Diagnosis not present

## 2023-07-02 DIAGNOSIS — I2510 Atherosclerotic heart disease of native coronary artery without angina pectoris: Secondary | ICD-10-CM | POA: Diagnosis present

## 2023-07-02 DIAGNOSIS — Z955 Presence of coronary angioplasty implant and graft: Secondary | ICD-10-CM | POA: Diagnosis not present

## 2023-07-02 DIAGNOSIS — I119 Hypertensive heart disease without heart failure: Secondary | ICD-10-CM | POA: Diagnosis present

## 2023-07-02 DIAGNOSIS — Z7902 Long term (current) use of antithrombotics/antiplatelets: Secondary | ICD-10-CM | POA: Diagnosis not present

## 2023-07-02 DIAGNOSIS — I10 Essential (primary) hypertension: Secondary | ICD-10-CM | POA: Diagnosis not present

## 2023-07-02 DIAGNOSIS — Z7984 Long term (current) use of oral hypoglycemic drugs: Secondary | ICD-10-CM

## 2023-07-02 DIAGNOSIS — Z7982 Long term (current) use of aspirin: Secondary | ICD-10-CM

## 2023-07-02 DIAGNOSIS — I43 Cardiomyopathy in diseases classified elsewhere: Secondary | ICD-10-CM | POA: Diagnosis present

## 2023-07-02 DIAGNOSIS — I4891 Unspecified atrial fibrillation: Principal | ICD-10-CM | POA: Diagnosis present

## 2023-07-02 DIAGNOSIS — I472 Ventricular tachycardia, unspecified: Secondary | ICD-10-CM | POA: Diagnosis present

## 2023-07-02 DIAGNOSIS — Z79899 Other long term (current) drug therapy: Secondary | ICD-10-CM

## 2023-07-02 DIAGNOSIS — I214 Non-ST elevation (NSTEMI) myocardial infarction: Secondary | ICD-10-CM | POA: Diagnosis not present

## 2023-07-02 DIAGNOSIS — G309 Alzheimer's disease, unspecified: Secondary | ICD-10-CM | POA: Diagnosis present

## 2023-07-02 DIAGNOSIS — Z66 Do not resuscitate: Secondary | ICD-10-CM | POA: Diagnosis present

## 2023-07-02 DIAGNOSIS — I255 Ischemic cardiomyopathy: Secondary | ICD-10-CM | POA: Diagnosis present

## 2023-07-02 DIAGNOSIS — F0280 Dementia in other diseases classified elsewhere without behavioral disturbance: Secondary | ICD-10-CM | POA: Diagnosis present

## 2023-07-02 DIAGNOSIS — I959 Hypotension, unspecified: Secondary | ICD-10-CM | POA: Diagnosis present

## 2023-07-02 DIAGNOSIS — Z888 Allergy status to other drugs, medicaments and biological substances status: Secondary | ICD-10-CM | POA: Diagnosis not present

## 2023-07-02 DIAGNOSIS — E785 Hyperlipidemia, unspecified: Secondary | ICD-10-CM | POA: Diagnosis present

## 2023-07-02 DIAGNOSIS — I480 Paroxysmal atrial fibrillation: Secondary | ICD-10-CM | POA: Diagnosis present

## 2023-07-02 LAB — I-STAT CHEM 8, ED
BUN: 33 mg/dL — ABNORMAL HIGH (ref 8–23)
Calcium, Ion: 1.07 mmol/L — ABNORMAL LOW (ref 1.15–1.40)
Chloride: 106 mmol/L (ref 98–111)
Creatinine, Ser: 1.7 mg/dL — ABNORMAL HIGH (ref 0.61–1.24)
Glucose, Bld: 143 mg/dL — ABNORMAL HIGH (ref 70–99)
HCT: 40 % (ref 39.0–52.0)
Hemoglobin: 13.6 g/dL (ref 13.0–17.0)
Potassium: 4.7 mmol/L (ref 3.5–5.1)
Sodium: 143 mmol/L (ref 135–145)
TCO2: 28 mmol/L (ref 22–32)

## 2023-07-02 LAB — URINALYSIS, ROUTINE W REFLEX MICROSCOPIC
Bilirubin Urine: NEGATIVE
Glucose, UA: NEGATIVE mg/dL
Ketones, ur: NEGATIVE mg/dL
Nitrite: NEGATIVE
Protein, ur: 30 mg/dL — AB
Specific Gravity, Urine: 1.019 (ref 1.005–1.030)
pH: 5 (ref 5.0–8.0)

## 2023-07-02 LAB — BASIC METABOLIC PANEL
Anion gap: 10 (ref 5–15)
BUN: 24 mg/dL — ABNORMAL HIGH (ref 8–23)
CO2: 23 mmol/L (ref 22–32)
Calcium: 8.8 mg/dL — ABNORMAL LOW (ref 8.9–10.3)
Chloride: 107 mmol/L (ref 98–111)
Creatinine, Ser: 1.67 mg/dL — ABNORMAL HIGH (ref 0.61–1.24)
GFR, Estimated: 39 mL/min — ABNORMAL LOW (ref >60)
Glucose, Bld: 148 mg/dL — ABNORMAL HIGH (ref 70–99)
Potassium: 4.4 mmol/L (ref 3.5–5.1)
Sodium: 140 mmol/L (ref 135–145)

## 2023-07-02 LAB — CBG MONITORING, ED: Glucose-Capillary: 138 mg/dL — ABNORMAL HIGH (ref 70–99)

## 2023-07-02 LAB — CBC WITH DIFFERENTIAL/PLATELET
Abs Immature Granulocytes: 0 10*3/uL (ref 0.00–0.07)
Basophils Absolute: 0.1 10*3/uL (ref 0.0–0.1)
Basophils Relative: 1 %
Eosinophils Absolute: 0.2 10*3/uL (ref 0.0–0.5)
Eosinophils Relative: 3 %
HCT: 40.6 % (ref 39.0–52.0)
Hemoglobin: 13.7 g/dL (ref 13.0–17.0)
Immature Granulocytes: 0 %
Lymphocytes Relative: 36 %
Lymphs Abs: 2 10*3/uL (ref 0.7–4.0)
MCH: 31.4 pg (ref 26.0–34.0)
MCHC: 33.7 g/dL (ref 30.0–36.0)
MCV: 92.9 fL (ref 80.0–100.0)
Monocytes Absolute: 0.4 10*3/uL (ref 0.1–1.0)
Monocytes Relative: 8 %
Neutro Abs: 2.9 10*3/uL (ref 1.7–7.7)
Neutrophils Relative %: 52 %
Platelets: 173 10*3/uL (ref 150–400)
RBC: 4.37 MIL/uL (ref 4.22–5.81)
RDW: 12.9 % (ref 11.5–15.5)
WBC: 5.5 10*3/uL (ref 4.0–10.5)
nRBC: 0 % (ref 0.0–0.2)

## 2023-07-02 LAB — MAGNESIUM: Magnesium: 1.8 mg/dL (ref 1.7–2.4)

## 2023-07-02 LAB — ECHOCARDIOGRAM COMPLETE
Area-P 1/2: 3.6 cm2
Height: 64 in
S' Lateral: 3.6 cm
Weight: 2560.02 [oz_av]

## 2023-07-02 LAB — TROPONIN I (HIGH SENSITIVITY)
Troponin I (High Sensitivity): 99 ng/L — ABNORMAL HIGH (ref <18)
Troponin I (High Sensitivity): 1662 ng/L (ref <18)

## 2023-07-02 LAB — TSH: TSH: 1.556 u[IU]/mL (ref 0.350–4.500)

## 2023-07-02 MED ORDER — ACETAMINOPHEN 500 MG PO TABS: 500.0000 mg | ORAL_TABLET | Freq: Four times a day (QID) | ORAL | Status: DC | PRN

## 2023-07-02 MED ORDER — DICLOFENAC SODIUM 1 % EX GEL
2.0000 g | Freq: Four times a day (QID) | CUTANEOUS | Status: DC | PRN
  Filled 2023-07-02: qty 100

## 2023-07-02 MED ORDER — HEPARIN (PORCINE) 25000 UT/250ML-% IV SOLN
750.0000 [IU]/h | INTRAVENOUS | Status: DC
  Administered 2023-07-02: 850 [IU]/h via INTRAVENOUS
  Filled 2023-07-02: qty 250

## 2023-07-02 MED ORDER — NITROGLYCERIN 0.4 MG SL SUBL: 0.4000 mg | SUBLINGUAL_TABLET | SUBLINGUAL | Status: DC | PRN

## 2023-07-02 MED ORDER — ATORVASTATIN CALCIUM 10 MG PO TABS
10.0000 mg | ORAL_TABLET | Freq: Every evening | ORAL | Status: DC
  Administered 2023-07-02: 10 mg via ORAL
  Filled 2023-07-02: qty 1

## 2023-07-02 MED ORDER — ATORVASTATIN CALCIUM 20 MG/5ML PO SUSP: 2.0000 mL | Freq: Every day | ORAL | Status: DC

## 2023-07-02 MED ORDER — ASPIRIN 81 MG PO TBEC
81.0000 mg | DELAYED_RELEASE_TABLET | Freq: Every day | ORAL | Status: DC
  Administered 2023-07-03 – 2023-07-04 (×2): 81 mg via ORAL
  Filled 2023-07-02 (×2): qty 1

## 2023-07-02 MED ORDER — ASPIRIN 81 MG PO CHEW
324.0000 mg | CHEWABLE_TABLET | Freq: Once | ORAL | Status: AC
  Administered 2023-07-02: 324 mg via ORAL
  Filled 2023-07-02: qty 4

## 2023-07-02 MED ORDER — DILTIAZEM HCL 30 MG PO TABS
30.0000 mg | ORAL_TABLET | Freq: Two times a day (BID) | ORAL | Status: DC
Start: 2023-07-02 — End: 2023-07-03
  Administered 2023-07-02 – 2023-07-03 (×3): 30 mg via ORAL
  Filled 2023-07-02 (×4): qty 1

## 2023-07-02 MED ORDER — HALOPERIDOL LACTATE 5 MG/ML IJ SOLN
2.0000 mg | Freq: Once | INTRAMUSCULAR | Status: AC | PRN
  Administered 2023-07-03: 2 mg via INTRAVENOUS
  Filled 2023-07-02: qty 1

## 2023-07-02 MED ORDER — MEMANTINE HCL 10 MG/5ML PO SOLN: 5.0000 mL | Freq: Two times a day (BID) | ORAL | Status: DC

## 2023-07-02 MED ORDER — ADULT MULTIVITAMIN W/MINERALS CH
1.0000 | ORAL_TABLET | Freq: Every day | ORAL | Status: DC
  Filled 2023-07-02 (×3): qty 1

## 2023-07-02 MED ORDER — CLOPIDOGREL BISULFATE 75 MG PO TABS
75.0000 mg | ORAL_TABLET | Freq: Every day | ORAL | Status: DC
  Administered 2023-07-02 – 2023-07-03 (×2): 75 mg via ORAL
  Filled 2023-07-02 (×2): qty 1

## 2023-07-02 MED ORDER — ONDANSETRON HCL 4 MG/2ML IJ SOLN: 4.0000 mg | Freq: Four times a day (QID) | INTRAMUSCULAR | Status: DC | PRN

## 2023-07-02 MED ORDER — ADULT MULTIVITAMIN W/MINERALS CH
1.0000 | ORAL_TABLET | Freq: Every day | ORAL | Status: DC
  Filled 2023-07-02: qty 1

## 2023-07-02 MED ORDER — LINAGLIPTIN 5 MG PO TABS
5.0000 mg | ORAL_TABLET | Freq: Every day | ORAL | Status: DC
  Administered 2023-07-02 – 2023-07-04 (×3): 5 mg via ORAL
  Filled 2023-07-02 (×3): qty 1

## 2023-07-02 MED ORDER — LACTATED RINGERS IV BOLUS
500.0000 mL | Freq: Once | INTRAVENOUS | Status: AC
  Administered 2023-07-02: 500 mL via INTRAVENOUS

## 2023-07-02 MED ORDER — ACETAMINOPHEN 325 MG PO TABS: 650.0000 mg | ORAL_TABLET | ORAL | Status: DC | PRN

## 2023-07-02 MED ORDER — MAGNESIUM SULFATE IN D5W 1-5 GM/100ML-% IV SOLN
1.0000 g | Freq: Once | INTRAVENOUS | Status: AC
  Administered 2023-07-02: 1 g via INTRAVENOUS
  Filled 2023-07-02: qty 100

## 2023-07-02 MED ORDER — PSYLLIUM 95 % PO PACK
1.0000 | PACK | Freq: Every day | ORAL | Status: DC
  Administered 2023-07-02 – 2023-07-04 (×3): 1 via ORAL
  Filled 2023-07-02 (×3): qty 1

## 2023-07-02 MED ORDER — HEPARIN BOLUS VIA INFUSION
4000.0000 [IU] | Freq: Once | INTRAVENOUS | Status: AC
  Administered 2023-07-02: 4000 [IU] via INTRAVENOUS
  Filled 2023-07-02: qty 4000

## 2023-07-02 MED ORDER — MEMANTINE HCL 10 MG PO TABS
10.0000 mg | ORAL_TABLET | Freq: Two times a day (BID) | ORAL | Status: DC
Start: 2023-07-02 — End: 2023-07-04
  Administered 2023-07-02 – 2023-07-04 (×5): 10 mg via ORAL
  Filled 2023-07-02 (×5): qty 1

## 2023-07-02 MED ORDER — AMIODARONE HCL 200 MG PO TABS
200.0000 mg | ORAL_TABLET | Freq: Every day | ORAL | Status: DC
  Administered 2023-07-02 – 2023-07-04 (×3): 200 mg via ORAL
  Filled 2023-07-02 (×3): qty 1

## 2023-07-02 MED ORDER — HEPARIN SODIUM (PORCINE) 5000 UNIT/ML IJ SOLN
5000.0000 [IU] | Freq: Three times a day (TID) | INTRAMUSCULAR | Status: DC
  Administered 2023-07-02: 5000 [IU] via SUBCUTANEOUS
  Filled 2023-07-02: qty 1

## 2023-07-02 NOTE — ED Notes (Signed)
Trop 1662 MD notified

## 2023-07-02 NOTE — Progress Notes (Signed)
ANTICOAGULATION CONSULT NOTE - Initial Consult  Pharmacy Consult for Heparin Indication: chest pain/ACS  Allergies  Allergen Reactions   Lisinopril     Other reaction(s): Cough   Metformin Diarrhea    Patient Measurements: Height: 5\' 4"  (162.6 cm) Weight: 72.6 kg (160 lb) IBW/kg (Calculated) : 59.2 Heparin Dosing Weight: 72.6 kg  Vital Signs: Temp: 97.6 F (36.4 C) (12/22 1317) Temp Source: Oral (12/22 1317) BP: 126/58 (12/22 1249) Pulse Rate: 72 (12/22 1249)  Labs: Recent Labs    07/02/23 1010 07/02/23 1016 07/02/23 1156  HGB 13.7 13.6  --   HCT 40.6 40.0  --   PLT 173  --   --   CREATININE 1.67* 1.70*  --   TROPONINIHS 99*  --  1,662*    Estimated Creatinine Clearance: 28 mL/min (A) (by C-G formula based on SCr of 1.7 mg/dL (H)).   Medical History: Past Medical History:  Diagnosis Date   Acute encephalopathy 02/12/2018   Alzheimer's dementia (HCC)    CAD (coronary artery disease)    CAP (community acquired pneumonia) 02/11/2018   Carpal tunnel syndrome, bilateral 02/25/2020   Coronary artery disease    Diabetes mellitus    Diverticulitis    Hearing loss    Hypertension    Hypokalemia 02/12/2018   NSTEMI (non-ST elevated myocardial infarction) (HCC)     Medications:  (Not in a hospital admission)  Scheduled:   [START ON 07/03/2023] aspirin EC  81 mg Oral Daily   atorvastatin  10 mg Oral QPM   clopidogrel  75 mg Oral Daily   diltiazem  30 mg Oral Q12H   linagliptin  5 mg Oral Daily   memantine  10 mg Oral BID   multivitamin with minerals  1 tablet Oral Daily   psyllium  1 packet Oral Daily   Infusions:  PRN: acetaminophen, diclofenac Sodium, haloperidol lactate, nitroGLYCERIN, ondansetron (ZOFRAN) IV  Assessment: 89 yom with a history of CAD, T2DM, HTN, HLD. Patient is presenting with palpitation and weakness. Heparin per pharmacy consult placed for chest pain/ACS.  Patient is not on anticoagulation prior to arrival. Patient did receive  5000 units SQ heparin at 1300 today.  Hgb 13.6; plt 173 hsTrop 1662  Goal of Therapy:  Heparin level 0.3-0.7 units/ml Monitor platelets by anticoagulation protocol: Yes   Plan:  Give IV heparin 4000 units bolus x 1 Start heparin infusion at 850 units/hr Check anti-Xa level in 8 hours and daily while on heparin Continue to monitor H&H and platelets  Delmar Landau, PharmD, BCPS 07/02/2023 2:41 PM ED Clinical Pharmacist -  [phone]

## 2023-07-02 NOTE — ED Notes (Signed)
Secretary to page admitting doctor stat for relaying critical troponin

## 2023-07-02 NOTE — Progress Notes (Signed)
Echocardiogram 2D Echocardiogram has been performed.  Alexander Donaldson 07/02/2023, 1:59 PM

## 2023-07-02 NOTE — ED Notes (Signed)
Pt to radiology via stretcher.  

## 2023-07-02 NOTE — Progress Notes (Signed)
Pt had a 29-beat-run of V-tach @ 2156, strip saved, pt is resting comfortably in bed with no complaints of pain or shortness of breath. Attempted to notify Algie Coffer, MD.   Bari Edward, RN

## 2023-07-02 NOTE — ED Triage Notes (Signed)
Pt coming in from home. Pt baseline has dementia. Alert to person and place only. Pt complaining of abdominal pain head and back of neck pain. Pt denies falling. Pt had hr of 150 for ems when they arrived. Pt was hypotensive for ems.   Bp 90/60 Hr 88 afib  Rr 16  Spo2 100%

## 2023-07-02 NOTE — Progress Notes (Signed)
Patient has elevated troponin I level. He has NSTEMI. He is DNR per family. He will be treated medically. He also has Non-sustained VT  Plan: Will give 1 gm IV magnesium, small dose PO amiodarone. Will hold B-blocker for prior h/o bradycardia  Orpah Cobb, MD 07/02/2023, 10:27 PM

## 2023-07-02 NOTE — Progress Notes (Signed)
Dr. Algie Coffer updated on patient's NSVT via telephone call back.  MD stated he was entering orders for magnesium.

## 2023-07-02 NOTE — ED Provider Notes (Signed)
[place] EMERGENCY DEPARTMENT AT [facility] Provider Note   CSN: [id number] Arrival date & time: 07/02/23  0940     History  Chief Complaint  Patient presents with   tachy   Headache   Neck Pain   Hypotension    Alexander Donaldson is a 89 y.o. male.  HPI Patient presents for initial concerns of headache.  Medical history includes CAD, HTN, DM, dementia.  EMS was called to his home for complaints of head and neck pain.  When they arrived on scene, patient was hypotensive and tachycardic.  Initial blood pressures were in the range of 70s SBP.  He was found to be tachycardic with atrial fibrillation with RVR.  No medications were given prior to arrival.  Patient had normalization of heart rate prior to arrival.  Currently, patient denies any complaints.  He denies any chest discomfort, headache, or neck pain.  History is limited by patient's dementia.    Home Medications Prior to Admission medications   Medication Sig Start Date End Date Taking? Authorizing Provider  acetaminophen (TYLENOL) 500 MG tablet Take 500 mg by mouth every 6 (six) hours as needed for moderate pain.   Yes [provider]  amLODIPine Benzoate 1 MG/ML SUSP Take 10 mLs by mouth daily at 8 pm. 12/22/22  Yes [provider]  aspirin 81 MG tablet Take 81 mg by mouth daily.   Yes [provider]  Atorvastatin Calcium 20 MG/5ML SUSP Take 2 mLs by mouth daily at 8 pm. 12/21/22  Yes [provider]  clopidogrel (PLAVIX) 75 MG tablet Take 75 mg by mouth daily. 01/02/17  Yes [provider]  diclofenac sodium (VOLTAREN) 1 % GEL Apply 2 g topically 4 (four) times daily as needed (pain). 11/01/18  Yes Melene Plan, DO  losartan (COZAAR) 100 MG tablet Take 100 mg by mouth daily.    Yes [provider]  Memantine HCl 10 MG/5ML SOLN Take 5 mLs by mouth 2 (two) times daily at 10 AM and 5 PM. 06/22/23  Yes Micki Riley, MD  Multiple Vitamin (MULTIVITAMIN) tablet Take 1  tablet by mouth daily.   Yes [provider]  nitroGLYCERIN (NITROSTAT) 0.4 MG SL tablet Place 1 tablet (0.4 mg total) under the tongue every 5 (five) minutes x 3 doses as needed for chest pain. 03/18/12  Yes Rinaldo Cloud, MD  psyllium (HYDROCIL/METAMUCIL) 95 % PACK Take 1 packet by mouth daily.   Yes [provider]  sitaGLIPtin (JANUVIA) 100 MG tablet Take 100 mg by mouth daily.   Yes [provider]      Allergies    Lisinopril and Metformin    Review of Systems   Review of Systems  Unable to perform ROS: Dementia  All other systems reviewed and are negative.   Physical Exam Updated Vital Signs BP 111/68   Pulse 71   Temp 97.6 F (36.4 C) (Oral)   Resp 17   Ht 5\' 4"  (1.626 m)   Wt 72.6 kg   SpO2 100%   BMI 27.46 kg/m  Physical Exam Vitals and nursing note reviewed.  Constitutional:      General: He is not in acute distress.    Appearance: Normal appearance. He is well-developed. He is not ill-appearing, toxic-appearing or diaphoretic.  HENT:     Head: Normocephalic and atraumatic.     Right Ear: External ear normal.     Left Ear: External ear normal.     Nose: Nose  normal.     Mouth/Throat:     Mouth: Mucous membranes are moist.  Eyes:     Extraocular Movements: Extraocular movements intact.     Conjunctiva/sclera: Conjunctivae normal.  Cardiovascular:     Rate and Rhythm: Normal rate and regular rhythm.     Heart sounds: No murmur heard. Pulmonary:     Effort: Pulmonary effort is normal. No respiratory distress.     Breath sounds: Normal breath sounds. No wheezing or rales.  Chest:     Chest wall: No tenderness.  Abdominal:     General: There is no distension.     Palpations: Abdomen is soft.     Tenderness: There is no abdominal tenderness.  Musculoskeletal:        General: No swelling. Normal range of motion.     Cervical back: Normal range of motion and neck supple.  Skin:    General: Skin is warm and dry.     Capillary  Refill: Capillary refill takes less than 2 seconds.     Coloration: Skin is not jaundiced or pale.  Neurological:     General: No focal deficit present.     Mental Status: He is alert. He is disoriented.  Psychiatric:        Mood and Affect: Mood normal.        Behavior: Behavior normal.     ED Results / Procedures / Treatments   Labs (all labs ordered are listed, but only abnormal results are displayed) Labs Reviewed  BASIC METABOLIC PANEL - Abnormal; Notable for the following components:      Result Value   Glucose, Bld 148 (*)    BUN 24 (*)    Creatinine, Ser 1.67 (*)    Calcium 8.8 (*)    GFR, Estimated 39 (*)    All other components within normal limits  CBG MONITORING, ED - Abnormal; Notable for the following components:   Glucose-Capillary 138 (*)    All other components within normal limits  I-STAT CHEM 8, ED - Abnormal; Notable for the following components:   BUN 33 (*)    Creatinine, Ser 1.70 (*)    Glucose, Bld 143 (*)    Calcium, Ion 1.07 (*)    All other components within normal limits  TROPONIN I (HIGH SENSITIVITY) - Abnormal; Notable for the following components:   Troponin I (High Sensitivity) 99 (*)    All other components within normal limits  TROPONIN I (HIGH SENSITIVITY) - Abnormal; Notable for the following components:   Troponin I (High Sensitivity) 1,662 (*)    All other components within normal limits  MAGNESIUM  CBC WITH DIFFERENTIAL/PLATELET  TSH  URINALYSIS, ROUTINE W REFLEX MICROSCOPIC  HEPARIN LEVEL (UNFRACTIONATED)    EKG EKG Interpretation Date/Time:  Sunday July 02 2023 09:47:07 EST Ventricular Rate:  94 PR Interval:  146 QRS Duration:  102 QT Interval:  359 QTC Calculation: 449 R Axis:   8  Text Interpretation: Sinus rhythm Multiple ventricular premature complexes Borderline ST depression, diffuse leads Confirmed by Gloris Manchester [phone]) on 07/02/2023 3:19:31 PM  Radiology CT Head Wo Contrast Result Date: 07/02/2023 CLINICAL  DATA:  Provided history: Headache, new onset. EXAM: CT HEAD WITHOUT CONTRAST TECHNIQUE: Contiguous axial images were obtained from the base of the skull through the vertex without intravenous contrast. RADIATION DOSE REDUCTION: This exam was performed according to the departmental dose-optimization program which includes automated exposure control, adjustment of the mA and/or kV according to patient size and/or use of iterative  reconstruction technique. COMPARISON:  Head CT 02/12/2018. FINDINGS: Brain: Moderate-to-advanced cerebral atrophy. Commensurate prominence of ventricles and sulci. Mild cerebellar atrophy. Patchy and ill-defined hypoattenuation within the cerebral white matter, nonspecific but compatible with mild chronic small vessel ischemic disease. There is no acute intracranial hemorrhage. No demarcated cortical infarct. No extra-axial fluid collection. No evidence of an intracranial mass. No midline shift. Vascular: No hyperdense vessel.  Atherosclerotic calcifications. Skull: No calvarial fracture or aggressive osseous lesion. Sinuses/Orbits: No mass or acute finding within the imaged orbits. Minimal mucosal thickening within a posterior right ethmoid air cell. IMPRESSION: 1.  No evidence of an acute intracranial abnormality. 2. Mild chronic small vessel ischemic changes within the cerebral white matter. 3. Moderate-to-advanced cerebral atrophy. 4. Mild cerebellar atrophy. Electronically Signed   By: Jackey Loge D.O.   On: 07/02/2023 10:52   DG Chest Portable 1 View Result Date: 07/02/2023 CLINICAL DATA:  Abdominal pain. EXAM: PORTABLE CHEST - 1 VIEW COMPARISON:  01/19/2023. FINDINGS: Cardiac silhouette is unremarkable. No pneumothorax or pleural effusion. There is a suboptimal inspiratory effort. Lungs are clear. The visualized skeletal structures are unremarkable. IMPRESSION: No acute cardiopulmonary process. Electronically Signed   By: Layla Maw M.D.   On: 07/02/2023 10:43     Procedures Procedures    Medications Ordered in ED Medications  haloperidol lactate (HALDOL) injection 2 mg (has no administration in time range)  aspirin EC tablet 81 mg (has no administration in time range)  ondansetron (ZOFRAN) injection 4 mg (has no administration in time range)  acetaminophen (TYLENOL) tablet 500 mg (has no administration in time range)  clopidogrel (PLAVIX) tablet 75 mg (75 mg Oral Given 07/02/23 1311)  diclofenac Sodium (VOLTAREN) 1 % topical gel 2 g (has no administration in time range)  nitroGLYCERIN (NITROSTAT) SL tablet 0.4 mg (has no administration in time range)  psyllium (HYDROCIL/METAMUCIL) 1 packet (1 packet Oral Given 07/02/23 1310)  linagliptin (TRADJENTA) tablet 5 mg (5 mg Oral Given 07/02/23 1311)  diltiazem (CARDIZEM) tablet 30 mg (30 mg Oral Given 07/02/23 1311)  atorvastatin (LIPITOR) tablet 10 mg (has no administration in time range)  memantine (NAMENDA) tablet 10 mg (10 mg Oral Given 07/02/23 1311)  multivitamin with minerals tablet 1 tablet (1 tablet Oral Patient Refused/Not Given 07/02/23 1311)  heparin ADULT infusion 100 units/mL (25000 units/238mL) (850 Units/hr Intravenous New Bag/Given 07/02/23 1459)  lactated ringers bolus 500 mL (500 mLs Intravenous New Bag/Given 07/02/23 1022)  aspirin chewable tablet 324 mg (324 mg Oral Given 07/02/23 1055)  heparin bolus via infusion 4,000 Units (4,000 Units Intravenous Bolus from Bag 07/02/23 1500)    ED Course/ Medical Decision Making/ A&P                                 Medical Decision Making Amount and/or Complexity of Data Reviewed Labs: ordered. Radiology: ordered.  Risk OTC drugs. Prescription drug management. Decision regarding hospitalization.   This patient presents to the ED for concern of headache and neck pain, this involves an extensive number of treatment options, and is a complaint that carries with it a high risk of complications and morbidity.  The differential  diagnosis includes migraine headache, tension headache, meningitis, ICH, trauma, ACS   Co morbidities that complicate the patient evaluation  CAD, HTN, DM, dementia   Additional history obtained:  Additional history obtained from EMS External records from outside source obtained and reviewed including EMR   Lab Tests:  I  Ordered, and personally interpreted labs.  The pertinent results include: AKI is present.  Electrolytes are normal.  No leukocytosis is present.  Initial troponin is elevated consistent with NSTEMI.   Imaging Studies ordered:  I ordered imaging studies including x-ray, CT head I independently visualized and interpreted imaging which showed no acute findings I agree with the radiologist interpretation   Cardiac Monitoring: / EKG:  The patient was maintained on a cardiac monitor.  I personally viewed and interpreted the cardiac monitored which showed an underlying rhythm of: Sinus rhythm  Problem List / ED Course / Critical interventions / Medication management  Patient presenting by EMS from home for initial concerns of headache and neck pain.  EMS noted atrial fibrillation with RVR in addition to hypotension.  EMS rhythm strips as follows:   Per chart review, he does not have a known history of atrial fibrillation.  He is not currently followed by cardiologist.  He is not on any AV nodal agents.  He does have prescriptions for losartan and amlodipine.  Patient is in normal sinus rhythm on arrival.  He denies any current physical complaints.  He denies any headache or neck pain.  He has full range of motion of his neck without discomfort.  His blood pressure remains soft.  He does not have any focal neurologic deficits.  He is disoriented.  Workup was initiated.  EKG in the ED did show ST segment depressions.  ASA was ordered.  IV fluids were ordered for soft blood pressure.  While in the ED, patient was seen by Dr. Algie Coffer who admitted him.  He was ultimately found  to have elevated troponin.  Heparin was started.  Catheterization is planned.  His blood pressures did improve. I ordered medication including ASA for concern of ACS; IV fluids for hydration Reevaluation of the patient after these medicines showed that the patient improved I have reviewed the patients home medicines and have made adjustments as needed   Social Determinants of Health:  Has access to outpatient care  CRITICAL CARE Performed by: Gloris Manchester   Total critical care time: 31 minutes  Critical care time was exclusive of separately billable procedures and treating other patients.  Critical care was necessary to treat or prevent imminent or life-threatening deterioration.  Critical care was time spent personally by me on the following activities: development of treatment plan with patient and/or surrogate as well as nursing, discussions with consultants, evaluation of patient's response to treatment, examination of patient, obtaining history from patient or surrogate, ordering and performing treatments and interventions, ordering and review of laboratory studies, ordering and review of radiographic studies, pulse oximetry and re-evaluation of patient's condition.         Final Clinical Impression(s) / ED Diagnoses Final diagnoses:  Atrial fibrillation with RVR (HCC)  NSTEMI (non-ST elevated myocardial infarction) [facility])    Rx / DC Orders ED Discharge Orders     None         Gloris Manchester, MD 07/02/23 1525

## 2023-07-02 NOTE — H&P (Signed)
Referring Physician: M. Harwani, MD/Alexander Donaldson is an 89 y.o. male.                       Chief Complaint: Palpitation and weakness  HPI: 89 years old black male with PMH of CAD, type 2 DM, HTN and hyperlipidemia had palpitation and weakness. EMS found him in atrial fibrillation with RVR. He converted spontaneously to SR in ER. His hypotension improved with fluid resuscitation. He is DNR per wife.  His CBC is normal and BMET is normal except creatinine of 1.67 mg. And glucose of 148 mg.  EKG shows sinus rhythm with diffuse ST changes. CXR and CT head reports are pending.  Past Medical History:  Diagnosis Date   Acute encephalopathy 02/12/2018   Alzheimer's dementia (HCC)    CAD (coronary artery disease)    CAP (community acquired pneumonia) 02/11/2018   Carpal tunnel syndrome, bilateral 02/25/2020   Coronary artery disease    Diabetes mellitus    Diverticulitis    Hearing loss    Hypertension    Hypokalemia 02/12/2018   NSTEMI (non-ST elevated myocardial infarction) [facility])       Past Surgical History:  Procedure Laterality Date   ANGIOPLASTY     CORONARY STENT INTERVENTION N/A 02/02/2017   Procedure: Coronary Stent Intervention;  Surgeon: Rinaldo Cloud, MD;  Location: [facility] INVASIVE CV LAB;  Service: Cardiovascular;  Laterality: N/A;   CORONARY STENTS     LEFT HEART CATH AND CORONARY ANGIOGRAPHY N/A 02/02/2017   Procedure: Left Heart Cath and Coronary Angiography;  Surgeon: Rinaldo Cloud, MD;  Location: [facility] INVASIVE CV LAB;  Service: Cardiovascular;  Laterality: N/A;   LEFT HEART CATHETERIZATION WITH CORONARY ANGIOGRAM N/A 03/16/2012   Procedure: LEFT HEART CATHETERIZATION WITH CORONARY ANGIOGRAM;  Surgeon: Robynn Pane, MD;  Location: [facility] CATH LAB;  Service: Cardiovascular;  Laterality: N/A;   PERCUTANEOUS CORONARY STENT INTERVENTION (PCI-S)  03/16/2012   Procedure: PERCUTANEOUS CORONARY STENT INTERVENTION (PCI-S);  Surgeon: Robynn Pane, MD;  Location: [facility] CATH LAB;   Service: Cardiovascular;;   TONSILLECTOMY      History reviewed. No pertinent family history. Social History:  reports that he has never smoked. He has never used smokeless tobacco. He reports that he does not drink alcohol and does not use drugs.  Allergies:  Allergies  Allergen Reactions   Lisinopril     Other reaction(s): Cough   Metformin Diarrhea    (Not in a hospital admission)   Results for orders placed or performed during the hospital encounter of 07/02/23 (from the past 48 hours)  CBG monitoring, ED     Status: Abnormal   Collection Time: 07/02/23  9:59 AM  Result Value Ref Range   Glucose-Capillary 138 (H) 70 - 99 mg/dL    Comment: Glucose reference range applies only to samples taken after fasting for at least 8 hours.  CBC with Differential/Platelet     Status: None   Collection Time: 07/02/23 10:10 AM  Result Value Ref Range   WBC 5.5 4.0 - 10.5 K/uL   RBC 4.37 4.22 - 5.81 MIL/uL   Hemoglobin 13.7 13.0 - 17.0 g/dL   HCT 13.0 86.5 - 78.4 %   MCV 92.9 80.0 - 100.0 fL   MCH 31.4 26.0 - 34.0 pg   MCHC 33.7 30.0 - 36.0 g/dL   RDW 69.6 29.5 - 28.4 %   Platelets 173 150 - 400 K/uL   nRBC 0.0 0.0 - 0.2 %  Neutrophils Relative % 52 %   Neutro Abs 2.9 1.7 - 7.7 K/uL   Lymphocytes Relative 36 %   Lymphs Abs 2.0 0.7 - 4.0 K/uL   Monocytes Relative 8 %   Monocytes Absolute 0.4 0.1 - 1.0 K/uL   Eosinophils Relative 3 %   Eosinophils Absolute 0.2 0.0 - 0.5 K/uL   Basophils Relative 1 %   Basophils Absolute 0.1 0.0 - 0.1 K/uL   Immature Granulocytes 0 %   Abs Immature Granulocytes 0.00 0.00 - 0.07 K/uL    Comment: Performed at [facility], [address], [place], [place] [address]  I-stat chem 8, ED     Status: Abnormal   Collection Time: 07/02/23 10:16 AM  Result Value Ref Range   Sodium 143 135 - 145 mmol/L   Potassium 4.7 3.5 - 5.1 mmol/L   Chloride 106 98 - 111 mmol/L   BUN 33 (H) 8 - 23 mg/dL   Creatinine, Ser 1.91 (H) 0.61 - 1.24 mg/dL    Glucose, Bld 478 (H) 70 - 99 mg/dL    Comment: Glucose reference range applies only to samples taken after fasting for at least 8 hours.   Calcium, Ion 1.07 (L) 1.15 - 1.40 mmol/L   TCO2 28 22 - 32 mmol/L   Hemoglobin 13.6 13.0 - 17.0 g/dL   HCT 29.5 62.1 - 30.8 %   CT Head Wo Contrast Result Date: 07/02/2023 CLINICAL DATA:  Provided history: Headache, new onset. EXAM: CT HEAD WITHOUT CONTRAST TECHNIQUE: Contiguous axial images were obtained from the base of the skull through the vertex without intravenous contrast. RADIATION DOSE REDUCTION: This exam was performed according to the departmental dose-optimization program which includes automated exposure control, adjustment of the mA and/or kV according to patient size and/or use of iterative reconstruction technique. COMPARISON:  Head CT 02/12/2018. FINDINGS: Brain: Moderate-to-advanced cerebral atrophy. Commensurate prominence of ventricles and sulci. Mild cerebellar atrophy. Patchy and ill-defined hypoattenuation within the cerebral white matter, nonspecific but compatible with mild chronic small vessel ischemic disease. There is no acute intracranial hemorrhage. No demarcated cortical infarct. No extra-axial fluid collection. No evidence of an intracranial mass. No midline shift. Vascular: No hyperdense vessel.  Atherosclerotic calcifications. Skull: No calvarial fracture or aggressive osseous lesion. Sinuses/Orbits: No mass or acute finding within the imaged orbits. Minimal mucosal thickening within a posterior right ethmoid air cell. IMPRESSION: 1.  No evidence of an acute intracranial abnormality. 2. Mild chronic small vessel ischemic changes within the cerebral white matter. 3. Moderate-to-advanced cerebral atrophy. 4. Mild cerebellar atrophy. Electronically Signed   By: Jackey Loge D.O.   On: 07/02/2023 10:52   DG Chest Portable 1 View Result Date: 07/02/2023 CLINICAL DATA:  Abdominal pain. EXAM: PORTABLE CHEST - 1 VIEW COMPARISON:  01/19/2023.  FINDINGS: Cardiac silhouette is unremarkable. No pneumothorax or pleural effusion. There is a suboptimal inspiratory effort. Lungs are clear. The visualized skeletal structures are unremarkable. IMPRESSION: No acute cardiopulmonary process. Electronically Signed   By: Layla Maw M.D.   On: 07/02/2023 10:43    Review Of Systems Constitutional: No fever, chills, weight loss or gain. Eyes: No vision change, wears glasses. No discharge or pain. Ears: Positive hearing loss, No tinnitus. Respiratory: No asthma, COPD, pneumonias. Positive shortness of breath. No hemoptysis. Cardiovascular: No chest pain, positive palpitation, no leg edema. Gastrointestinal: No nausea, vomiting, diarrhea, constipation. No GI bleed. No hepatitis. Genitourinary: No dysuria, hematuria, kidney stone. No incontinance. Neurological: No headache, stroke, seizures.  Psychiatry: No psych facility admission  for anxiety, depression, suicide. No detox. Skin: No rash. Musculoskeletal: Positive joint pain, no fibromyalgia. No neck pain, back pain. Lymphadenopathy: No lymphadenopathy. Hematology: No anemia or easy bruising.   Blood pressure 98/78, pulse 92, temperature (!) 97.5 F (36.4 C), resp. rate (!) 23, height 5\' 4"  (1.626 m), weight 72.6 kg, SpO2 100%. Body mass index is 27.46 kg/m. General appearance: alert, cooperative, appears stated age and mild respiratory distress Head: Normocephalic, atraumatic. Eyes: Brown eyes, pink conjunctiva, corneas clear.  Neck: No adenopathy, no carotid bruit, no JVD, supple, symmetrical, trachea midline and thyroid not enlarged. Resp: Clear to auscultation bilaterally. Cardio: Regular rate and rhythm, S1, S2 normal, II/VI systolic murmur, no click, rub or gallop GI: Soft, non-tender; bowel sounds normal; no organomegaly. Extremities: No edema, cyanosis or clubbing. Skin: Warm and dry.  Neurologic: Alert and oriented X 2, normal strength. Normal  coordination.  Assessment/Plan Atrial fibrillation with RVR HTN HLD Type 2 DM Senile dementia HCM, non-obstructive  Plan: Place in observation. Change amlodipine to small dose of diltiazem. Continue aspirin, Plavix v/s small dose Eliquis. Echocardiogram for LV function  Time spent: Review of old records, Lab, x-rays, EKG, other cardiac tests, examination, discussion with patient/Wife/Nurse/Doctor over 70 minutes.  Ricki Rodriguez, MD  07/02/2023, 10:56 AM

## 2023-07-03 ENCOUNTER — Other Ambulatory Visit (HOSPITAL_COMMUNITY): Payer: Self-pay

## 2023-07-03 LAB — BASIC METABOLIC PANEL
Anion gap: 9 (ref 5–15)
BUN: 27 mg/dL — ABNORMAL HIGH (ref 8–23)
CO2: 25 mmol/L (ref 22–32)
Calcium: 8.6 mg/dL — ABNORMAL LOW (ref 8.9–10.3)
Chloride: 105 mmol/L (ref 98–111)
Creatinine, Ser: 1.6 mg/dL — ABNORMAL HIGH (ref 0.61–1.24)
GFR, Estimated: 41 mL/min — ABNORMAL LOW (ref >60)
Glucose, Bld: 105 mg/dL — ABNORMAL HIGH (ref 70–99)
Potassium: 3.4 mmol/L — ABNORMAL LOW (ref 3.5–5.1)
Sodium: 139 mmol/L (ref 135–145)

## 2023-07-03 LAB — CBC
HCT: 35.3 % — ABNORMAL LOW (ref 39.0–52.0)
Hemoglobin: 12.1 g/dL — ABNORMAL LOW (ref 13.0–17.0)
MCH: 31.3 pg (ref 26.0–34.0)
MCHC: 34.3 g/dL (ref 30.0–36.0)
MCV: 91.5 fL (ref 80.0–100.0)
Platelets: 147 10*3/uL — ABNORMAL LOW (ref 150–400)
RBC: 3.86 MIL/uL — ABNORMAL LOW (ref 4.22–5.81)
RDW: 13.1 % (ref 11.5–15.5)
WBC: 5.2 10*3/uL (ref 4.0–10.5)
nRBC: 0 % (ref 0.0–0.2)

## 2023-07-03 LAB — MAGNESIUM: Magnesium: 2 mg/dL (ref 1.7–2.4)

## 2023-07-03 LAB — LIPID PANEL
Cholesterol: 148 mg/dL (ref 0–200)
HDL: 51 mg/dL (ref >40)
LDL Cholesterol: 90 mg/dL (ref 0–99)
Total CHOL/HDL Ratio: 2.9 {ratio}
Triglycerides: 35 mg/dL (ref <150)
VLDL: 7 mg/dL (ref 0–40)

## 2023-07-03 LAB — HEPARIN LEVEL (UNFRACTIONATED): Heparin Unfractionated: 0.78 [IU]/mL — ABNORMAL HIGH (ref 0.30–0.70)

## 2023-07-03 LAB — TROPONIN I (HIGH SENSITIVITY): Troponin I (High Sensitivity): 2743 ng/L (ref <18)

## 2023-07-03 MED ORDER — ATORVASTATIN CALCIUM 80 MG PO TABS
80.0000 mg | ORAL_TABLET | Freq: Every evening | ORAL | Status: DC
  Administered 2023-07-03: 80 mg via ORAL
  Filled 2023-07-03: qty 1

## 2023-07-03 MED ORDER — POTASSIUM CHLORIDE CRYS ER 10 MEQ PO TBCR
10.0000 meq | EXTENDED_RELEASE_TABLET | Freq: Two times a day (BID) | ORAL | Status: DC
  Administered 2023-07-03 – 2023-07-04 (×3): 10 meq via ORAL
  Filled 2023-07-03 (×3): qty 1

## 2023-07-03 MED ORDER — APIXABAN 2.5 MG PO TABS
2.5000 mg | ORAL_TABLET | Freq: Two times a day (BID) | ORAL | Status: DC
Start: 2023-07-03 — End: 2023-07-04
  Administered 2023-07-03 (×2): 2.5 mg via ORAL
  Filled 2023-07-03 (×2): qty 1

## 2023-07-03 MED ORDER — POTASSIUM CHLORIDE ER 8 MEQ PO CPCR
8.0000 meq | ORAL_CAPSULE | Freq: Two times a day (BID) | ORAL | Status: DC
  Filled 2023-07-03: qty 1

## 2023-07-03 MED ORDER — HALOPERIDOL LACTATE 5 MG/ML IJ SOLN: 2.0000 mg | Freq: Two times a day (BID) | INTRAMUSCULAR | Status: DC | PRN

## 2023-07-03 MED ORDER — METOPROLOL SUCCINATE ER 25 MG PO TB24
25.0000 mg | ORAL_TABLET | Freq: Every day | ORAL | Status: DC
  Administered 2023-07-03: 25 mg via ORAL
  Filled 2023-07-03 (×2): qty 1

## 2023-07-03 NOTE — Progress Notes (Signed)
PHARMACY - ANTICOAGULATION CONSULT NOTE  Pharmacy Consult for heparin Indication:  NSTEMI  Labs: Recent Labs    07/02/23 1010 07/02/23 1016 07/02/23 1156 07/03/23 0347  HGB 13.7 13.6  --  12.1*  HCT 40.6 40.0  --  35.3*  PLT 173  --   --  147*  HEPARINUNFRC  --   --   --  0.78*  CREATININE 1.67* 1.70*  --  1.60*  TROPONINIHS 99*  --  1,662* 2,743*   Assessment: 89yo male admitted with AF and NSTEMI, started on IV heparin.  Pharmacy to transition to apixaban. Age >80y/o, Cr is 1.6 (appears BL closer to 1), Wt 70kg - qualifies for 2.5mg  BID dosing for now.  Goal of Therapy:  Heparin level 0.3-0.7 units/ml   Plan:  Stop heparin Apixaban 2.5mg  BID  Fredonia Highland, PharmD, BCPS, [facility] Clinical Pharmacist [phone] Please check AMION for all [facility] numbers 07/03/2023

## 2023-07-03 NOTE — Progress Notes (Signed)
Subjective:  Patient denies any chest pain or shortness of breath.  Remains in sinus rhythm  Objective:  Vital Signs in the last 24 hours: Temp:  [97.6 F (36.4 C)-98.5 F (36.9 C)] 97.9 F (36.6 C) (12/23 1229) Pulse Rate:  [57-88] 80 (12/23 1229) Resp:  [14-26] 19 (12/23 1229) BP: (106-158)/(48-108) 158/68 (12/23 1229) SpO2:  [99 %-100 %] 100 % (12/22 1653) Weight:  [70.3 kg-72.5 kg] 70.3 kg (12/23 0350)  Intake/Output from previous day: 12/22 0701 - 12/23 0700 In: 558.6 [P.O.:360; I.V.:98.6; IV Piggyback:100] Out: 241 [Urine:241] Intake/Output from this shift: Total I/O In: 360 [P.O.:360] Out: -   Physical Exam: Neck: no adenopathy, no carotid bruit, no JVD, and supple, symmetrical, trachea midline Lungs: clear to auscultation bilaterally Heart: regular rate and rhythm, S1, S2 normal, and 2/6 systolic murmur noted Abdomen: soft, non-tender; bowel sounds normal; no masses,  no organomegaly Extremities: extremities normal, atraumatic, no cyanosis or edema  Lab Results: Recent Labs    07/02/23 1010 07/02/23 1016 07/03/23 0347  WBC 5.5  --  5.2  HGB 13.7 13.6 12.1*  PLT 173  --  147*   Recent Labs    07/02/23 1010 07/02/23 1016 07/03/23 0347  NA 140 143 139  K 4.4 4.7 3.4*  CL 107 106 105  CO2 23  --  25  GLUCOSE 148* 143* 105*  BUN 24* 33* 27*  CREATININE 1.67* 1.70* 1.60*   No results for input(s): "TROPONINI" in the last 72 hours.  Invalid input(s): "CK", "MB" Hepatic Function Panel No results for input(s): "PROT", "ALBUMIN", "AST", "ALT", "ALKPHOS", "BILITOT", "BILIDIR", "IBILI" in the last 72 hours. Recent Labs    07/03/23 0347  CHOL 148   No results for input(s): "PROTIME" in the last 72 hours.  Imaging: Imaging results have been reviewed and ECHOCARDIOGRAM COMPLETE Result Date: 07/02/2023    ECHOCARDIOGRAM REPORT   Patient Name:   Alexander Donaldson Date of Exam: 07/02/2023 Medical Rec #:  [id number]       Height:       64.0 in Accession #:     [id number]      Weight:       160.0 lb Date of Birth:  1934/01/27        BSA:          1.779 m Patient Age:    89 years        BP:           138/87 mmHg Patient Gender: M               HR:           70 bpm. Exam Location:  Inpatient Procedure: 2D Echo, Cardiac Doppler and Color Doppler Indications:     Atrial Fibrillation I48.91  History:         Patient has prior history of Echocardiogram examinations, most                  recent 01/30/2022. Previous Myocardial Infarction and CAD,                  Arrythmias:Atrial Fibrillation; Risk Factors:Hypertension and                  Diabetes.  Sonographer:     Lucendia Herrlich RCS Referring Phys:  [id number] Orpah Cobb Diagnosing Phys: Orpah Cobb MD  Sonographer Comments: Image acquisition challenging due to patient behavioral factors. and Image acquisition challenging due to uncooperative patient. IMPRESSIONS  1.  Left ventricular ejection fraction, by estimation, is 40 to 45%. The left ventricle has mildly decreased function. The left ventricle demonstrates regional wall motion abnormalities (see scoring diagram/findings for description). There is mild concentric left ventricular hypertrophy. Left ventricular diastolic parameters are consistent with Grade I diastolic dysfunction (impaired relaxation). There is severe hypokinesis of the left ventricular, basal-mid inferior wall.  2. Right ventricular systolic function is normal. The right ventricular size is normal.  3. Left atrial size was mildly dilated.  4. The mitral valve is degenerative. Mild mitral valve regurgitation. Moderate mitral annular calcification.  5. The aortic valve is tricuspid. There is moderate calcification of the aortic valve. There is mild thickening of the aortic valve. Aortic valve regurgitation is mild. Aortic valve sclerosis/calcification is present, without any evidence of aortic stenosis.  6. There is mild (Grade II) atheroma plaque involving the aortic root and ascending aorta.  Conclusion(s)/Recommendation(s): Findings consistent with ischemic cardiomyopathy and hypertrophic cardiomyopathy. FINDINGS  Left Ventricle: Left ventricular ejection fraction, by estimation, is 40 to 45%. The left ventricle has mildly decreased function. The left ventricle demonstrates regional wall motion abnormalities. Severe hypokinesis of the left ventricular, basal-mid inferior wall. The left ventricular internal cavity size was normal in size. There is mild concentric left ventricular hypertrophy. Left ventricular diastolic parameters are consistent with Grade I diastolic dysfunction (impaired relaxation).  LV Wall Scoring: The inferior wall, posterior wall, and basal anterolateral segment are hypokinetic. The entire anterior wall, entire septum, entire apex, and mid anterolateral segment are normal. Right Ventricle: The right ventricular size is normal. No increase in right ventricular wall thickness. Right ventricular systolic function is normal. Left Atrium: Left atrial size was mildly dilated. Right Atrium: Right atrial size was normal in size. Pericardium: There is no evidence of pericardial effusion. Mitral Valve: The mitral valve is degenerative in appearance. Moderate mitral annular calcification. Mild mitral valve regurgitation. Tricuspid Valve: The tricuspid valve is normal in structure. Tricuspid valve regurgitation is trivial. Aortic Valve: The aortic valve is tricuspid. There is moderate calcification of the aortic valve. There is mild thickening of the aortic valve. There is moderate to severe aortic valve annular calcification. Aortic valve regurgitation is mild. Aortic valve sclerosis/calcification is present, without any evidence of aortic stenosis. Pulmonic Valve: The pulmonic valve was normal in structure. Pulmonic valve regurgitation is not visualized. Aorta: The aortic root is normal in size and structure. There is mild (Grade II) atheroma plaque involving the aortic root and ascending  aorta. Venous: The inferior vena cava was not well visualized. IAS/Shunts: The atrial septum is grossly normal.  LEFT VENTRICLE PLAX 2D LVIDd:         4.35 cm   Diastology LVIDs:         3.60 cm   LV e' medial:    3.70 cm/s LV PW:         1.15 cm   LV E/e' medial:  19.8 LV IVS:        1.25 cm   LV e' lateral:   6.74 cm/s LVOT diam:     2.30 cm   LV E/e' lateral: 10.8 LV SV:         47 LV SV Index:   26 LVOT Area:     4.15 cm  RIGHT VENTRICLE RV S prime:     10.70 cm/s TAPSE (M-mode): 1.0 cm LEFT ATRIUM         Index LA diam:    3.95 cm 2.22 cm/m  AORTIC VALVE LVOT Vmax:  60.80 cm/s LVOT Vmean:  38.800 cm/s LVOT VTI:    0.112 m  AORTA Ao Root diam: 3.50 cm MITRAL VALVE MV Area (PHT): 3.60 cm     SHUNTS MV Decel Time: 211 msec     Systemic VTI:  0.11 m MV E velocity: 73.10 cm/s   Systemic Diam: 2.30 cm MV A velocity: 108.00 cm/s MV E/A ratio:  0.68 Orpah Cobb MD Electronically signed by Orpah Cobb MD Signature Date/Time: 07/02/2023/3:37:13 PM    Final    CT Head Wo Contrast Result Date: 07/02/2023 CLINICAL DATA:  Provided history: Headache, new onset. EXAM: CT HEAD WITHOUT CONTRAST TECHNIQUE: Contiguous axial images were obtained from the base of the skull through the vertex without intravenous contrast. RADIATION DOSE REDUCTION: This exam was performed according to the departmental dose-optimization program which includes automated exposure control, adjustment of the mA and/or kV according to patient size and/or use of iterative reconstruction technique. COMPARISON:  Head CT 02/12/2018. FINDINGS: Brain: Moderate-to-advanced cerebral atrophy. Commensurate prominence of ventricles and sulci. Mild cerebellar atrophy. Patchy and ill-defined hypoattenuation within the cerebral white matter, nonspecific but compatible with mild chronic small vessel ischemic disease. There is no acute intracranial hemorrhage. No demarcated cortical infarct. No extra-axial fluid collection. No evidence of an intracranial mass. No  midline shift. Vascular: No hyperdense vessel.  Atherosclerotic calcifications. Skull: No calvarial fracture or aggressive osseous lesion. Sinuses/Orbits: No mass or acute finding within the imaged orbits. Minimal mucosal thickening within a posterior right ethmoid air cell. IMPRESSION: 1.  No evidence of an acute intracranial abnormality. 2. Mild chronic small vessel ischemic changes within the cerebral white matter. 3. Moderate-to-advanced cerebral atrophy. 4. Mild cerebellar atrophy. Electronically Signed   By: Jackey Loge D.O.   On: 07/02/2023 10:52   DG Chest Portable 1 View Result Date: 07/02/2023 CLINICAL DATA:  Abdominal pain. EXAM: PORTABLE CHEST - 1 VIEW COMPARISON:  01/19/2023. FINDINGS: Cardiac silhouette is unremarkable. No pneumothorax or pleural effusion. There is a suboptimal inspiratory effort. Lungs are clear. The visualized skeletal structures are unremarkable. IMPRESSION: No acute cardiopulmonary process. Electronically Signed   By: Layla Maw M.D.   On: 07/02/2023 10:43    Cardiac Studies:  Assessment/Plan:  Acute NSTEMI Multivessel CAD status post multivessel PCI in remote past Ischemic cardiomyopathy Status post paroxysmal A-fib with RVR Hypertension Hyperlipidemia Type 2 diabetes mellitus Senile dementia Degenerative joint disease Plan DC Plavix and heparin Start Eliquis per pharmacy protocol Continue aspirin low-dose Check labs and EKG in a.m.  LOS: 1 day    Rinaldo Cloud 07/03/2023, 12:54 PM

## 2023-07-03 NOTE — TOC CM/SW Note (Signed)
Transition of Care [facility]) - Inpatient Brief Assessment   Patient Details  Name: Alexander Donaldson MRN: [id number] Date of Birth: 05-Sep-1933  [facility] of [facility]) CM/SW Contact:    Gala Lewandowsky, RN Phone Number: 07/03/2023, 3:34 PM   Clinical Narrative: Patient presented for palpitations and weakness. Son was at the bedside during the visit. PTA patient was from home with spouse. Patient has DME cane and rolling walker. Patient is a member of the [employer]- Case Production designer, theatre/television/film did make the Fees coordinator aware of hospitalization. No home needs identified at this time.    Transition of Care Asessment: Insurance and Status: Insurance coverage has been reviewed Patient has primary care physician: Yes Home environment has been reviewed: reviewed Prior/Current Home Services: No current home services Social Drivers of Health Review: SDOH reviewed no interventions necessary Readmission risk has been reviewed: Yes Transition of care needs: no transition of care needs at this time

## 2023-07-03 NOTE — Progress Notes (Signed)
PHARMACY - ANTICOAGULATION CONSULT NOTE  Pharmacy Consult for heparin Indication:  NSTEMI  Labs: Recent Labs    07/02/23 1010 07/02/23 1016 07/02/23 1156 07/03/23 0347  HGB 13.7 13.6  --  12.1*  HCT 40.6 40.0  --  35.3*  PLT 173  --   --  147*  HEPARINUNFRC  --   --   --  0.78*  CREATININE 1.67* 1.70*  --   --   TROPONINIHS 99*  --  1,662*  --    Assessment: 89yo male supratherapeutic on heparin with initial dosing for NSTEMI; no signs of bleeding per RN but she does note that IV access was lost around shift change, no issues since resumed ~2030.  Goal of Therapy:  Heparin level 0.3-0.7 units/ml   Plan:  Decrease heparin infusion by 1-2 units/kg/hr to 750 units/hr. Check level in 8 hours.   Vernard Gambles, PharmD, BCPS 07/03/2023 4:39 AM

## 2023-07-03 NOTE — Plan of Care (Signed)

## 2023-07-03 NOTE — Progress Notes (Signed)
Heart Failure Navigator Progress Note  Assessed for Heart & Vascular TOC clinic readiness.  Patient does not meet criteria due to seen by Dr. Algie Coffer.   Navigator will sign off at this time.   Rhae Hammock, BSN, Scientist, clinical (histocompatibility and immunogenetics) Only

## 2023-07-03 NOTE — Progress Notes (Addendum)
Pt flipped into LBBB with wider QRS, and new ST depression while RN was in room. Pt is asymptomatic, EKG obtained. MD Hawani notified.  1920: RN spoke with Dr Algie Coffer, monitor vital signs, no new orders received at this time

## 2023-07-04 ENCOUNTER — Other Ambulatory Visit (HOSPITAL_COMMUNITY): Payer: Self-pay

## 2023-07-04 LAB — LIPID PANEL
Cholesterol: 156 mg/dL (ref 0–200)
HDL: 54 mg/dL (ref >40)
LDL Cholesterol: 88 mg/dL (ref 0–99)
Total CHOL/HDL Ratio: 2.9 {ratio}
Triglycerides: 70 mg/dL (ref <150)
VLDL: 14 mg/dL (ref 0–40)

## 2023-07-04 LAB — BASIC METABOLIC PANEL
Anion gap: 10 (ref 5–15)
BUN: 28 mg/dL — ABNORMAL HIGH (ref 8–23)
CO2: 20 mmol/L — ABNORMAL LOW (ref 22–32)
Calcium: 8.6 mg/dL — ABNORMAL LOW (ref 8.9–10.3)
Chloride: 104 mmol/L (ref 98–111)
Creatinine, Ser: 1.31 mg/dL — ABNORMAL HIGH (ref 0.61–1.24)
GFR, Estimated: 53 mL/min — ABNORMAL LOW (ref >60)
Glucose, Bld: 85 mg/dL (ref 70–99)
Potassium: 3.5 mmol/L (ref 3.5–5.1)
Sodium: 134 mmol/L — ABNORMAL LOW (ref 135–145)

## 2023-07-04 LAB — CBC
HCT: 36.9 % — ABNORMAL LOW (ref 39.0–52.0)
Hemoglobin: 12.8 g/dL — ABNORMAL LOW (ref 13.0–17.0)
MCH: 31.2 pg (ref 26.0–34.0)
MCHC: 34.7 g/dL (ref 30.0–36.0)
MCV: 90 fL (ref 80.0–100.0)
Platelets: 144 10*3/uL — ABNORMAL LOW (ref 150–400)
RBC: 4.1 MIL/uL — ABNORMAL LOW (ref 4.22–5.81)
RDW: 12.3 % (ref 11.5–15.5)
WBC: 6.7 10*3/uL (ref 4.0–10.5)
nRBC: 0 % (ref 0.0–0.2)

## 2023-07-04 LAB — TROPONIN I (HIGH SENSITIVITY)
Troponin I (High Sensitivity): 1316 ng/L (ref <18)
Troponin I (High Sensitivity): 933 ng/L (ref <18)

## 2023-07-04 LAB — MAGNESIUM: Magnesium: 1.9 mg/dL (ref 1.7–2.4)

## 2023-07-04 MED ORDER — AMIODARONE HCL 200 MG PO TABS
200.0000 mg | ORAL_TABLET | Freq: Every day | ORAL | 0 refills | Status: DC
  Filled 2023-07-04: qty 30, 30d supply, fill #0

## 2023-07-04 MED ORDER — APIXABAN 5 MG PO TABS: 5.0000 mg | ORAL_TABLET | Freq: Two times a day (BID) | ORAL | 3 refills | Status: DC

## 2023-07-04 MED ORDER — ATORVASTATIN CALCIUM 80 MG PO TABS: 80.0000 mg | ORAL_TABLET | Freq: Every evening | ORAL | 3 refills | Status: DC

## 2023-07-04 MED ORDER — APIXABAN 5 MG PO TABS
5.0000 mg | ORAL_TABLET | Freq: Two times a day (BID) | ORAL | 0 refills | Status: DC
  Filled 2023-07-04: qty 60, 30d supply, fill #0

## 2023-07-04 MED ORDER — LINAGLIPTIN 5 MG PO TABS: 5.0000 mg | ORAL_TABLET | Freq: Every day | ORAL | 3 refills | Status: DC

## 2023-07-04 MED ORDER — APIXABAN 5 MG PO TABS
5.0000 mg | ORAL_TABLET | Freq: Two times a day (BID) | ORAL | Status: DC
  Administered 2023-07-04: 5 mg via ORAL
  Filled 2023-07-04: qty 1

## 2023-07-04 MED ORDER — AMIODARONE HCL 200 MG PO TABS: 200.0000 mg | ORAL_TABLET | Freq: Every day | ORAL | 3 refills | Status: AC

## 2023-07-04 NOTE — Progress Notes (Signed)
PHARMACY - ANTICOAGULATION CONSULT NOTE  Pharmacy Consult for heparin Indication:  NSTEMI  Labs: Recent Labs    07/02/23 1010 07/02/23 1016 07/02/23 1156 07/03/23 0347 07/03/23 2355 07/04/23 0351  HGB 13.7 13.6  --  12.1*  --  12.8*  HCT 40.6 40.0  --  35.3*  --  36.9*  PLT 173  --   --  147*  --  144*  HEPARINUNFRC  --   --   --  0.78*  --   --   CREATININE 1.67* 1.70*  --  1.60*  --  1.31*  TROPONINIHS 99*  --    < > 2,743* 1,316* 933*   < > = values in this interval not displayed.   Assessment: 89yo male admitted with AF and NSTEMI, started on IV heparin.  Pharmacy to transition to apixaban. Cr has improved closer to baseline so will adjust dose.  Goal of Therapy:  Heparin level 0.3-0.7 units/ml   Plan:  Apixaban 5mg  BID Pharmacy will sign off, reconsult as needed   Fredonia Highland, PharmD, BCPS, [facility] Clinical Pharmacist [phone] Please check AMION for all [facility] Pharmacy numbers 07/04/2023

## 2023-07-04 NOTE — Plan of Care (Signed)

## 2023-07-04 NOTE — Discharge Instructions (Signed)
Information on my medicine - ELIQUIS (apixaban)  This medication education was reviewed with me or my healthcare representative as part of my discharge preparation.  The pharmacist that spoke with me during my hospital stay was:  Einar Grad, [facility]  Why was Eliquis prescribed for you? Eliquis was prescribed for you to reduce the risk of a blood clot forming that can cause a stroke if you have a medical condition called atrial fibrillation (a type of irregular heartbeat).  What do You need to know about Eliquis ? Take your Eliquis TWICE DAILY - one tablet in the morning and one tablet in the evening with or without food. If you have difficulty swallowing the tablet whole please discuss with your pharmacist how to take the medication safely.  Take Eliquis exactly as prescribed by your doctor and DO NOT stop taking Eliquis without talking to the doctor who prescribed the medication.  Stopping may increase your risk of developing a stroke.  Refill your prescription before you run out.  After discharge, you should have regular check-up appointments with your healthcare provider that is prescribing your Eliquis.  In the future your dose may need to be changed if your kidney function or weight changes by a significant amount or as you get older.  What do you do if you miss a dose? If you miss a dose, take it as soon as you remember on the same day and resume taking twice daily.  Do not take more than one dose of ELIQUIS at the same time to make up a missed dose.  Important Safety Information A possible side effect of Eliquis is bleeding. You should call your healthcare provider right away if you experience any of the following: Bleeding from an injury or your nose that does not stop. Unusual colored urine (red or dark brown) or unusual colored stools (red or black). Unusual bruising for unknown reasons. A serious fall or if you hit your head (even if there is no bleeding).  Some  medicines may interact with Eliquis and might increase your risk of bleeding or clotting while on Eliquis. To help avoid this, consult your healthcare provider or pharmacist prior to using any new prescription or non-prescription medications, including herbals, vitamins, non-steroidal anti-inflammatory drugs (NSAIDs) and supplements.  This website has more information on Eliquis (apixaban): http://www.eliquis.com/eliquis/home

## 2023-07-04 NOTE — Discharge Summary (Signed)
Discharge summary dictated on 07/04/2023 dictation number is [id number]

## 2023-07-04 NOTE — Discharge Summary (Unsigned)
NAME: Alexander Donaldson, Alexander Donaldson MEDICAL RECORD NO: [id number] ACCOUNT NO: [id number] DATE OF BIRTH: 1934-03-24 FACILITY: [facility] LOCATION: [id number] PHYSICIAN: Eduardo Osier. Sharyn Lull, MD  Discharge Summary   ADMITTING PHYSICIAN:  Dr. Algie Coffer.  DATE OF ADMISSION: 07/02/2023  DATE OF DISCHARGE: 07/04/2023  ADMITTING DIAGNOSES:  1.  Atrial fibrillation with rapid ventricular response. 2.  Hypertension. 3.  Hyperlipidemia. 4.  Type 2 diabetes mellitus. 5.  Senile dementia. 6.  Nonobstructive hypertensive cardiomyopathy.  FINAL DIAGNOSES:  1.  Status post acute small non-Q wave myocardial infarction. 2.  History of multivessel CAD, status post multivessel PCI in remote past. 3.  Ischemic cardiomyopathy. 4.  Status post paroxysmal A-fib with RVR. 5.  Status post nonsustained ventricular tachycardia. 6.  Hypertension. 7.  Hyperlipidemia. 8.  Type 2 diabetes mellitus. 9.  Senile dementia. 10.  Degenerated joint disease.  DISCHARGE HOME MEDICATIONS:  1.  Amiodarone 200 mg 1 tablet daily for 1 week and will be reduced as outpatient as appropriate. 2.  Eliquis 5 mg one tablet twice daily. 3.  Atorvastatin 80 mg one tablet daily. 4.  Tradjenta 5 mg one tablet daily. 5.  Acetaminophen 500 mg every 6 hours as needed. 6.  Aspirin 81 mg one tablet daily. 7.  Diclofenac sodium 1% gel 2 grams apply locally as needed. 8.  Losartan 100 mg daily. 9.  Memantine 10 mg/5 mL, 5 mL by mouth two times daily. 10.  Multivitamin one tablet daily. 11.  Nitrostat sublingual p.r.n. as directed. 12.  Psyllium 1 package by mouth daily as before.  Stop amlodipine.  Stop atorvastatin.  Stop clopidogrel.  Stop Januvia.  Atorvastatin dose has been increased to 80 mg daily.  DIET:  Low salt, low cholesterol 1800 calories ADA diet.  Discharge instructions have been given.  Discussed at length with the patient's wife regarding various options of treatment and opted for medical management.  The patient will be followed up in my  office within one week.  CONDITION AT DISCHARGE:  Stable.  BRIEF HISTORY AND HOSPITAL COURSE:  The patient is an 89 year old black male with past medical history significant for multivessel CAD and multivessel PCI in remote past, hypertension, diabetes mellitus, hyperlipidemia, had episode of palpitation and  weakness.  EMS found him in atrial fibrillation with RVR.  He converted spontaneously to sinus rhythm and in ER had hypotension, which improved with fluid resuscitation.  He is DNR per his wife.  EKG in the ED showed sinus rhythm with diffuse ST-T wave  changes.  PHYSICAL EXAMINATION: VITAL SIGNS:  Blood pressure was 98/78, pulse 92, regular.  He was afebrile. HEENT:  Head normocephalic, atraumatic.  Eyes: Brown, pink conjunctiva.  Cornea clear. NECK: No adenopathy.  No carotid bruits.  No JVD, supple, symmetrical.  Trachea midline.  Thyroid not enlarged. LUNGS:  Lungs were clear to auscultation bilaterally. HEART:  Cardiovascular exam, S1 and S2 was normal.  There was a II/VI systolic murmur.  No click, rub, or gallop. ABDOMEN:  Abdomen was soft, nontender.  Bowel sounds were normal.  No organomegaly. EXTREMITIES:  There was no clubbing, cyanosis, or edema. NEUROLOGIC:  He was alert and oriented x2.  Normal strength.  Normal coordination.  LABORATORY DATA:  Sodium was 140, potassium 4.4, BUN 24, creatinine 1.67, magnesium was 1.8, hemoglobin 13.7, hematocrit 40.6, white count of 5.5.  His troponin I was 99.  Repeat troponin were 1662, 2743, and then trending down to 1316 last night and  today is 933.  Last electrolyte, sodium 134, potassium 3.5,  BUN 28, creatinine has come down to 1.31.  His cholesterol was 148, HDL 51, LDL was 90.  Repeat hemoglobin is 12.8, hematocrit 36.9, white count of 6.7, platelet count is 144,000.  It has been  stable.  His TSH was 1.55.  IMAGING:  Admission EKG showed normal sinus rhythm with PVCs and diffuse borderline ST-T wave changes.  EKG this morning showed  sinus bradycardia.  QTc is normal.  ST-T wave changes have improved.  BRIEF HOSPITAL COURSE:  The patient was admitted to step-down unit.  The patient ruled in for small non-Q wave myocardial infarction.  The patient did not have any episodes of chest pain during the hospital stay.  The patient had brief episodes of  nonsustained ventricular tachycardia, spontaneously converted back to sinus rhythm.  No further episodes of AFib in the hospital.  The patient was started on low-dose amiodarone, which he is tolerating it well.  Initially, the patient was started on  Cardizem, which was switched to metoprolol succinate, although the patient did not receive any dose of metoprolol succinate because of episodes of bradycardia.  His heart rate has been stable in 50s now.  He is asymptomatic, walking in the room.   Discussed again with his wife and opted for medical management only.  The patient is DNR.  The patient will be discharged home with family and will be followed up in my office early next week.   PUS D: 07/04/2023 11:12:39 am T: 07/04/2023 11:48:00 am  JOB: [id number]/ [id number]

## 2023-07-05 LAB — LIPOPROTEIN A (LPA): Lipoprotein (a): 97.7 nmol/L — ABNORMAL HIGH (ref <75.0)

## 2023-07-14 DIAGNOSIS — I2510 Atherosclerotic heart disease of native coronary artery without angina pectoris: Secondary | ICD-10-CM | POA: Diagnosis not present

## 2023-07-14 DIAGNOSIS — E1169 Type 2 diabetes mellitus with other specified complication: Secondary | ICD-10-CM | POA: Diagnosis not present

## 2023-07-14 DIAGNOSIS — I255 Ischemic cardiomyopathy: Secondary | ICD-10-CM | POA: Diagnosis not present

## 2023-07-14 DIAGNOSIS — I10 Essential (primary) hypertension: Secondary | ICD-10-CM | POA: Diagnosis not present

## 2023-09-03 ENCOUNTER — Emergency Department (HOSPITAL_COMMUNITY): Payer: No Typology Code available for payment source

## 2023-09-03 ENCOUNTER — Inpatient Hospital Stay (HOSPITAL_COMMUNITY)
Admission: EM | Admit: 2023-09-03 | Discharge: 2023-09-09 | DRG: 064 | Disposition: A | Discharge status: Home / Self Care | Payer: No Typology Code available for payment source | Attending: Family Medicine | Admitting: Family Medicine

## 2023-09-03 ENCOUNTER — Other Ambulatory Visit: Payer: Self-pay

## 2023-09-03 DIAGNOSIS — Z888 Allergy status to other drugs, medicaments and biological substances status: Secondary | ICD-10-CM

## 2023-09-03 DIAGNOSIS — E876 Hypokalemia: Secondary | ICD-10-CM | POA: Diagnosis present

## 2023-09-03 DIAGNOSIS — D500 Iron deficiency anemia secondary to blood loss (chronic): Secondary | ICD-10-CM

## 2023-09-03 DIAGNOSIS — R471 Dysarthria and anarthria: Secondary | ICD-10-CM | POA: Diagnosis present

## 2023-09-03 DIAGNOSIS — E785 Hyperlipidemia, unspecified: Secondary | ICD-10-CM | POA: Diagnosis present

## 2023-09-03 DIAGNOSIS — R29898 Other symptoms and signs involving the musculoskeletal system: Secondary | ICD-10-CM | POA: Diagnosis present

## 2023-09-03 DIAGNOSIS — T45515A Adverse effect of anticoagulants, initial encounter: Secondary | ICD-10-CM | POA: Diagnosis present

## 2023-09-03 DIAGNOSIS — R29708 NIHSS score 8: Secondary | ICD-10-CM | POA: Diagnosis present

## 2023-09-03 DIAGNOSIS — G459 Transient cerebral ischemic attack, unspecified: Secondary | ICD-10-CM | POA: Diagnosis present

## 2023-09-03 DIAGNOSIS — N179 Acute kidney failure, unspecified: Secondary | ICD-10-CM | POA: Diagnosis present

## 2023-09-03 DIAGNOSIS — I130 Hypertensive heart and chronic kidney disease with heart failure and stage 1 through stage 4 chronic kidney disease, or unspecified chronic kidney disease: Secondary | ICD-10-CM | POA: Diagnosis present

## 2023-09-03 DIAGNOSIS — I480 Paroxysmal atrial fibrillation: Secondary | ICD-10-CM | POA: Diagnosis present

## 2023-09-03 DIAGNOSIS — I6329 Cerebral infarction due to unspecified occlusion or stenosis of other precerebral arteries: Secondary | ICD-10-CM | POA: Diagnosis not present

## 2023-09-03 DIAGNOSIS — K922 Gastrointestinal hemorrhage, unspecified: Principal | ICD-10-CM | POA: Diagnosis present

## 2023-09-03 DIAGNOSIS — I10 Essential (primary) hypertension: Secondary | ICD-10-CM | POA: Diagnosis present

## 2023-09-03 DIAGNOSIS — R29810 Facial weakness: Secondary | ICD-10-CM | POA: Diagnosis present

## 2023-09-03 DIAGNOSIS — H9190 Unspecified hearing loss, unspecified ear: Secondary | ICD-10-CM | POA: Diagnosis present

## 2023-09-03 DIAGNOSIS — R4701 Aphasia: Secondary | ICD-10-CM | POA: Diagnosis present

## 2023-09-03 DIAGNOSIS — E8809 Other disorders of plasma-protein metabolism, not elsewhere classified: Secondary | ICD-10-CM | POA: Diagnosis present

## 2023-09-03 DIAGNOSIS — R1310 Dysphagia, unspecified: Secondary | ICD-10-CM | POA: Diagnosis not present

## 2023-09-03 DIAGNOSIS — Z955 Presence of coronary angioplasty implant and graft: Secondary | ICD-10-CM

## 2023-09-03 DIAGNOSIS — R195 Other fecal abnormalities: Secondary | ICD-10-CM

## 2023-09-03 DIAGNOSIS — I6381 Other cerebral infarction due to occlusion or stenosis of small artery: Secondary | ICD-10-CM | POA: Diagnosis present

## 2023-09-03 DIAGNOSIS — M4800 Spinal stenosis, site unspecified: Secondary | ICD-10-CM | POA: Diagnosis present

## 2023-09-03 DIAGNOSIS — Z66 Do not resuscitate: Secondary | ICD-10-CM | POA: Diagnosis present

## 2023-09-03 DIAGNOSIS — Z8673 Personal history of transient ischemic attack (TIA), and cerebral infarction without residual deficits: Secondary | ICD-10-CM

## 2023-09-03 DIAGNOSIS — I5042 Chronic combined systolic (congestive) and diastolic (congestive) heart failure: Secondary | ICD-10-CM | POA: Diagnosis present

## 2023-09-03 DIAGNOSIS — F02811 Dementia in other diseases classified elsewhere, unspecified severity, with agitation: Secondary | ICD-10-CM | POA: Diagnosis not present

## 2023-09-03 DIAGNOSIS — R29700 NIHSS score 0: Secondary | ICD-10-CM | POA: Diagnosis present

## 2023-09-03 DIAGNOSIS — D62 Acute posthemorrhagic anemia: Secondary | ICD-10-CM | POA: Diagnosis present

## 2023-09-03 DIAGNOSIS — I5032 Chronic diastolic (congestive) heart failure: Secondary | ICD-10-CM | POA: Diagnosis present

## 2023-09-03 DIAGNOSIS — E119 Type 2 diabetes mellitus without complications: Secondary | ICD-10-CM

## 2023-09-03 DIAGNOSIS — F05 Delirium due to known physiological condition: Secondary | ICD-10-CM | POA: Diagnosis not present

## 2023-09-03 DIAGNOSIS — M21372 Foot drop, left foot: Secondary | ICD-10-CM | POA: Diagnosis present

## 2023-09-03 DIAGNOSIS — I2510 Atherosclerotic heart disease of native coronary artery without angina pectoris: Secondary | ICD-10-CM | POA: Diagnosis present

## 2023-09-03 DIAGNOSIS — R531 Weakness: Secondary | ICD-10-CM | POA: Diagnosis present

## 2023-09-03 DIAGNOSIS — E1122 Type 2 diabetes mellitus with diabetic chronic kidney disease: Secondary | ICD-10-CM | POA: Diagnosis present

## 2023-09-03 DIAGNOSIS — Z7982 Long term (current) use of aspirin: Secondary | ICD-10-CM

## 2023-09-03 DIAGNOSIS — I252 Old myocardial infarction: Secondary | ICD-10-CM

## 2023-09-03 DIAGNOSIS — Z7901 Long term (current) use of anticoagulants: Secondary | ICD-10-CM

## 2023-09-03 DIAGNOSIS — R262 Difficulty in walking, not elsewhere classified: Secondary | ICD-10-CM | POA: Diagnosis present

## 2023-09-03 DIAGNOSIS — M5432 Sciatica, left side: Secondary | ICD-10-CM | POA: Diagnosis present

## 2023-09-03 DIAGNOSIS — D509 Iron deficiency anemia, unspecified: Secondary | ICD-10-CM | POA: Diagnosis present

## 2023-09-03 DIAGNOSIS — Z79899 Other long term (current) drug therapy: Secondary | ICD-10-CM

## 2023-09-03 DIAGNOSIS — K319 Disease of stomach and duodenum, unspecified: Secondary | ICD-10-CM | POA: Diagnosis present

## 2023-09-03 DIAGNOSIS — N1832 Chronic kidney disease, stage 3b: Secondary | ICD-10-CM | POA: Diagnosis present

## 2023-09-03 DIAGNOSIS — G309 Alzheimer's disease, unspecified: Secondary | ICD-10-CM | POA: Diagnosis present

## 2023-09-03 DIAGNOSIS — D6832 Hemorrhagic disorder due to extrinsic circulating anticoagulants: Secondary | ICD-10-CM | POA: Diagnosis present

## 2023-09-03 DIAGNOSIS — Z7984 Long term (current) use of oral hypoglycemic drugs: Secondary | ICD-10-CM

## 2023-09-03 DIAGNOSIS — I69391 Dysphagia following cerebral infarction: Secondary | ICD-10-CM

## 2023-09-03 LAB — URINALYSIS, W/ REFLEX TO CULTURE (INFECTION SUSPECTED)
Bilirubin Urine: NEGATIVE
Glucose, UA: NEGATIVE mg/dL
Ketones, ur: NEGATIVE mg/dL
Leukocytes,Ua: NEGATIVE
Nitrite: NEGATIVE
Protein, ur: NEGATIVE mg/dL
Specific Gravity, Urine: 1.014 (ref 1.005–1.030)
pH: 6 (ref 5.0–8.0)

## 2023-09-03 LAB — COMPREHENSIVE METABOLIC PANEL
ALT: 16 U/L (ref 0–44)
AST: 19 U/L (ref 15–41)
Albumin: 3.4 g/dL — ABNORMAL LOW (ref 3.5–5.0)
Alkaline Phosphatase: 47 U/L (ref 38–126)
Anion gap: 10 (ref 5–15)
BUN: 34 mg/dL — ABNORMAL HIGH (ref 8–23)
CO2: 24 mmol/L (ref 22–32)
Calcium: 8.7 mg/dL — ABNORMAL LOW (ref 8.9–10.3)
Chloride: 103 mmol/L (ref 98–111)
Creatinine, Ser: 1.72 mg/dL — ABNORMAL HIGH (ref 0.61–1.24)
GFR, Estimated: 38 mL/min — ABNORMAL LOW (ref >60)
Glucose, Bld: 160 mg/dL — ABNORMAL HIGH (ref 70–99)
Potassium: 4.1 mmol/L (ref 3.5–5.1)
Sodium: 137 mmol/L (ref 135–145)
Total Bilirubin: 0.7 mg/dL (ref 0.0–1.2)
Total Protein: 6.3 g/dL — ABNORMAL LOW (ref 6.5–8.1)

## 2023-09-03 LAB — CBC WITH DIFFERENTIAL/PLATELET
Abs Immature Granulocytes: 0.01 10*3/uL (ref 0.00–0.07)
Basophils Absolute: 0 10*3/uL (ref 0.0–0.1)
Basophils Relative: 1 %
Eosinophils Absolute: 0.1 10*3/uL (ref 0.0–0.5)
Eosinophils Relative: 3 %
HCT: 24.8 % — ABNORMAL LOW (ref 39.0–52.0)
Hemoglobin: 8.1 g/dL — ABNORMAL LOW (ref 13.0–17.0)
Immature Granulocytes: 0 %
Lymphocytes Relative: 39 %
Lymphs Abs: 2.1 10*3/uL (ref 0.7–4.0)
MCH: 31.4 pg (ref 26.0–34.0)
MCHC: 32.7 g/dL (ref 30.0–36.0)
MCV: 96.1 fL (ref 80.0–100.0)
Monocytes Absolute: 0.5 10*3/uL (ref 0.1–1.0)
Monocytes Relative: 8 %
Neutro Abs: 2.7 10*3/uL (ref 1.7–7.7)
Neutrophils Relative %: 49 %
Platelets: 211 10*3/uL (ref 150–400)
RBC: 2.58 MIL/uL — ABNORMAL LOW (ref 4.22–5.81)
RDW: 13.4 % (ref 11.5–15.5)
WBC: 5.5 10*3/uL (ref 4.0–10.5)
nRBC: 0 % (ref 0.0–0.2)

## 2023-09-03 LAB — POC OCCULT BLOOD, ED: Fecal Occult Bld: POSITIVE — AB

## 2023-09-03 LAB — PROTIME-INR
INR: 1.7 — ABNORMAL HIGH (ref 0.8–1.2)
Prothrombin Time: 19.9 s — ABNORMAL HIGH (ref 11.4–15.2)

## 2023-09-03 LAB — I-STAT CG4 LACTIC ACID, ED: Lactic Acid, Venous: 1.7 mmol/L (ref 0.5–1.9)

## 2023-09-03 MED ORDER — LORAZEPAM 2 MG/ML IJ SOLN
1.0000 mg | Freq: Once | INTRAMUSCULAR | Status: AC
  Administered 2023-09-04: 1 mg via INTRAVENOUS

## 2023-09-03 NOTE — ED Triage Notes (Signed)
 Pt BIB [facility] from home c/o a possible stroke. Per the wife he had a facial droop, slurred speech and bilateral weakness. Per EMS when they arrived no unilateral weakness, no facial droop noted. Around midnight we were having trouble walking, shuffling his feet, when he got up at 10am it was the same. Wife also states he has some urinary symptoms, frequency and foul odor.

## 2023-09-03 NOTE — ED Notes (Signed)
 First lac 1.7

## 2023-09-03 NOTE — ED Provider Notes (Signed)
 [place] EMERGENCY DEPARTMENT AT [facility] Provider Note   CSN: [id number] Arrival date & time: 09/03/23  1829     History  Chief Complaint  Patient presents with   Weakness    Alexander Donaldson is a 89 y.o. male.  Patient is an 89 year old male with a past medical history of dementia ANO x 0 though conversant and ambulatory at baseline, A-fib on Eliquis, CAD, diabetes presenting to the emergency department with weakness.  Patient is here with his wife who provides history.  She states that he tried to get up to go to the bathroom around midnight last night and was walking with a shuffling gait and appeared to be off balance.  She states that normally he walks independently or occasionally will use a cane.  She states he was continuing to have difficulty walking this morning and then while eating dinner around 4 PM she noticed that he was having difficulty using his right arm to feed himself when normally he feeds himself independently.  She states that she also thought that he had a right sided facial droop.  She states that she called 911.  She states on EMS arrival he had no focal deficits and it appeared that his weakness was starting to improve.  She states that he has not complained of any recent headaches or pain and has not had any recent falls.  The history is provided by the spouse. The history is limited by the condition of the patient (Level 5 caveat for dementia).  Weakness      Home Medications Prior to Admission medications   Medication Sig Start Date End Date Taking? Authorizing Provider  acetaminophen (TYLENOL) 500 MG tablet Take 500 mg by mouth every 6 (six) hours as needed for moderate pain.    [provider]  amiodarone (PACERONE) 200 MG tablet Take 1 tablet (200 mg total) by mouth daily. 07/05/23   Rinaldo Cloud, MD  amiodarone (PACERONE) 200 MG tablet Take 1 tablet (200 mg total) by mouth daily. 07/04/23   Rinaldo Cloud, MD  apixaban  (ELIQUIS) 5 MG TABS tablet Take 1 tablet (5 mg total) by mouth 2 (two) times daily. 07/04/23   Rinaldo Cloud, MD  apixaban (ELIQUIS) 5 MG TABS tablet Take 1 tablet (5 mg total) by mouth 2 (two) times daily. 07/04/23   Rinaldo Cloud, MD  aspirin 81 MG tablet Take 81 mg by mouth daily.    [provider]  atorvastatin (LIPITOR) 80 MG tablet Take 1 tablet (80 mg total) by mouth every evening. 07/04/23   Rinaldo Cloud, MD  diclofenac sodium (VOLTAREN) 1 % GEL Apply 2 g topically 4 (four) times daily as needed (pain). 11/01/18   Melene Plan, DO  linagliptin (TRADJENTA) 5 MG TABS tablet Take 1 tablet (5 mg total) by mouth daily. 07/05/23   Rinaldo Cloud, MD  losartan (COZAAR) 100 MG tablet Take 100 mg by mouth daily.     [provider]  Memantine HCl 10 MG/5ML SOLN Take 5 mLs by mouth 2 (two) times daily at 10 AM and 5 PM. 06/22/23   Micki Riley, MD  Multiple Vitamin (MULTIVITAMIN) tablet Take 1 tablet by mouth daily.    [provider]  nitroGLYCERIN (NITROSTAT) 0.4 MG SL tablet Place 1 tablet (0.4 mg total) under the tongue every 5 (five) minutes x 3 doses as needed for chest pain. 03/18/12   Rinaldo Cloud, MD  psyllium (HYDROCIL/METAMUCIL) 95 % PACK Take 1 packet by mouth  daily.    [provider]      Allergies    Lisinopril and Metformin    Review of Systems   Review of Systems  Neurological:  Positive for weakness.    Physical Exam Updated Vital Signs BP (!) 145/81   Pulse 69   Temp 98.8 F (37.1 C) (Oral)   Resp 12   SpO2 100%  Physical Exam Vitals and nursing note reviewed.  Constitutional:      General: He is not in acute distress.    Appearance: Normal appearance.  HENT:     Head: Normocephalic and atraumatic.     Nose: Nose normal.     Mouth/Throat:     Mouth: Mucous membranes are moist.     Pharynx: Oropharynx is clear.  Eyes:     Extraocular Movements: Extraocular movements intact.     Pupils: Pupils are equal, round, and  reactive to light.  Cardiovascular:     Rate and Rhythm: Normal rate and regular rhythm.     Heart sounds: Normal heart sounds.  Pulmonary:     Effort: Pulmonary effort is normal.     Breath sounds: Normal breath sounds.  Abdominal:     General: Abdomen is flat.     Palpations: Abdomen is soft.     Tenderness: There is no abdominal tenderness.  Musculoskeletal:        General: Normal range of motion.     Cervical back: Normal range of motion and neck supple.  Skin:    General: Skin is warm and dry.  Neurological:     Mental Status: He is alert.     Comments: Oriented x0, speech sounds clear No obvious facial droop No drift in all 4 extremities Sensation intact Confused and unable to follow commands to evaluate for coordination with finger-to-nose  Psychiatric:        Mood and Affect: Mood normal.        Behavior: Behavior normal.     ED Results / Procedures / Treatments   Labs (all labs ordered are listed, but only abnormal results are displayed) Labs Reviewed  CBC WITH DIFFERENTIAL/PLATELET - Abnormal; Notable for the following components:      Result Value   RBC 2.58 (*)    Hemoglobin 8.1 (*)    HCT 24.8 (*)    All other components within normal limits  COMPREHENSIVE METABOLIC PANEL - Abnormal; Notable for the following components:   Glucose, Bld 160 (*)    BUN 34 (*)    Creatinine, Ser 1.72 (*)    Calcium 8.7 (*)    Total Protein 6.3 (*)    Albumin 3.4 (*)    GFR, Estimated 38 (*)    All other components within normal limits  URINALYSIS, W/ REFLEX TO CULTURE (INFECTION SUSPECTED) - Abnormal; Notable for the following components:   Hgb urine dipstick MODERATE (*)    Bacteria, UA RARE (*)    All other components within normal limits  PROTIME-INR - Abnormal; Notable for the following components:   Prothrombin Time 19.9 (*)    INR 1.7 (*)    All other components within normal limits  POC OCCULT BLOOD, ED - Abnormal; Notable for the following components:   Fecal  Occult Bld POSITIVE (*)    All other components within normal limits  CULTURE, BLOOD (ROUTINE X 2)  CULTURE, BLOOD (ROUTINE X 2)  I-STAT CG4 LACTIC ACID, ED    EKG EKG Interpretation Date/Time:  Sunday September 03 2023 18:49:19  EST Ventricular Rate:  70 PR Interval:  161 QRS Duration:  99 QT Interval:  410 QTC Calculation: 443 R Axis:   18  Text Interpretation: Sinus rhythm No significant change since last tracing Confirmed by Elayne Snare ([id number]) on 09/03/2023 8:55:33 PM  Radiology CT HEAD WO CONTRAST ( ) Result Date: 09/03/2023 CLINICAL DATA:  Right lower extremity weakness EXAM: CT HEAD WITHOUT CONTRAST TECHNIQUE: Contiguous axial images were obtained from the base of the skull through the vertex without intravenous contrast. RADIATION DOSE REDUCTION: This exam was performed according to the departmental dose-optimization program which includes automated exposure control, adjustment of the mA and/or kV according to patient size and/or use of iterative reconstruction technique. COMPARISON:  07/02/2023 FINDINGS: Brain: There is no mass, hemorrhage or extra-axial collection. There is generalized atrophy without lobar predilection. Hypodensity of the white matter is most commonly associated with chronic microvascular disease. Vascular: No hyperdense vessel or unexpected vascular calcification. Skull: The visualized skull base, calvarium and extracranial soft tissues are normal. Sinuses/Orbits: No fluid levels or advanced mucosal thickening of the visualized paranasal sinuses. No mastoid or middle ear effusion. Normal orbits. Other: None. IMPRESSION: 1. No acute intracranial abnormality. 2. Generalized atrophy and findings of chronic microvascular disease. Electronically Signed   By: Deatra Robinson M.D.   On: 09/03/2023 20:08    Procedures Procedures    Medications Ordered in ED Medications  LORazepam (ATIVAN) injection 1 mg (has no administration in time range)    ED Course/  Medical Decision Making/ A&P Clinical Course as of 09/03/23 2351  Sun Sep 03, 2023  2054 Hgb 8.1 from baseline ~12. Will have hemoccult. Cr ~ at baseline. CTH without acute disease and will need MRI to evaluate for possible TIA. [VK]  2304 Hemoccult is positive. I messaged Dr. Rhea Belton with [facility] for consult. [VK]  2330 Patient signed out to Dr. Manus Gunning pending MRI with plan for admission. [VK]    Clinical Course User Index [VK] Rexford Maus, DO                                 Medical Decision Making This patient presents to the ED with chief complaint(s) of weakness with pertinent past medical history of Dementia, A fib, CAD, DM, HTN which further complicates the presenting complaint. The complaint involves an extensive differential diagnosis and also carries with it a high risk of complications and morbidity.    The differential diagnosis includes hypo or hyperglycemia, CVA, TIA, ICH, mass effect, dehydration, electrolyte abnormality, arrhythmia, anemia  Additional history obtained: Additional history obtained from spouse Records reviewed previous admission documents  ED Course and Reassessment: On patient's arrival he was hemodynamically stable in no acute distress and at his neurologic baseline.  Was initially evaluated by provider in triage and had labs and head CT performed.  Patient's workup is pending at this time with no current focal neurologic deficits he will be closely reassessed.  Independent labs interpretation:  The following labs were independently interpreted: Anemia with hemoglobin of 8.1 from baseline of 12 2 months ago, creatinine approximately at baseline, Hemoccult positive stool  Independent visualization of imaging: - I independently visualized the following imaging with scope of interpretation limited to determining acute life threatening conditions related to emergency care: CT head, which revealed no acute disease  Consultation: - Consulted or  discussed management/test interpretation w/ external professional: GI    Amount and/or Complexity of Data Reviewed Radiology: ordered.  Risk Prescription drug management.          Final Clinical Impression(s) / ED Diagnoses Final diagnoses:  Gastrointestinal hemorrhage, unspecified gastrointestinal hemorrhage type    Rx / DC Orders ED Discharge Orders     None         Rexford Maus, DO 09/03/23 2351

## 2023-09-03 NOTE — ED Notes (Signed)
 MRI notified that patient now has ativan order to complete scan

## 2023-09-04 ENCOUNTER — Encounter (HOSPITAL_COMMUNITY): Payer: Self-pay | Admitting: Internal Medicine

## 2023-09-04 ENCOUNTER — Observation Stay (HOSPITAL_COMMUNITY): Payer: No Typology Code available for payment source

## 2023-09-04 DIAGNOSIS — R29898 Other symptoms and signs involving the musculoskeletal system: Secondary | ICD-10-CM | POA: Diagnosis not present

## 2023-09-04 DIAGNOSIS — I480 Paroxysmal atrial fibrillation: Secondary | ICD-10-CM | POA: Diagnosis not present

## 2023-09-04 DIAGNOSIS — I2510 Atherosclerotic heart disease of native coronary artery without angina pectoris: Secondary | ICD-10-CM | POA: Diagnosis present

## 2023-09-04 DIAGNOSIS — Z7901 Long term (current) use of anticoagulants: Secondary | ICD-10-CM | POA: Diagnosis not present

## 2023-09-04 DIAGNOSIS — N1832 Chronic kidney disease, stage 3b: Secondary | ICD-10-CM | POA: Diagnosis present

## 2023-09-04 DIAGNOSIS — I6381 Other cerebral infarction due to occlusion or stenosis of small artery: Secondary | ICD-10-CM | POA: Diagnosis present

## 2023-09-04 DIAGNOSIS — I6350 Cerebral infarction due to unspecified occlusion or stenosis of unspecified cerebral artery: Secondary | ICD-10-CM | POA: Diagnosis not present

## 2023-09-04 DIAGNOSIS — I69391 Dysphagia following cerebral infarction: Secondary | ICD-10-CM | POA: Diagnosis not present

## 2023-09-04 DIAGNOSIS — R4701 Aphasia: Secondary | ICD-10-CM | POA: Diagnosis present

## 2023-09-04 DIAGNOSIS — E785 Hyperlipidemia, unspecified: Secondary | ICD-10-CM | POA: Diagnosis present

## 2023-09-04 DIAGNOSIS — G459 Transient cerebral ischemic attack, unspecified: Secondary | ICD-10-CM | POA: Diagnosis present

## 2023-09-04 DIAGNOSIS — E876 Hypokalemia: Secondary | ICD-10-CM | POA: Diagnosis present

## 2023-09-04 DIAGNOSIS — R29810 Facial weakness: Secondary | ICD-10-CM | POA: Diagnosis present

## 2023-09-04 DIAGNOSIS — I5042 Chronic combined systolic (congestive) and diastolic (congestive) heart failure: Secondary | ICD-10-CM | POA: Diagnosis not present

## 2023-09-04 DIAGNOSIS — D6832 Hemorrhagic disorder due to extrinsic circulating anticoagulants: Secondary | ICD-10-CM | POA: Diagnosis present

## 2023-09-04 DIAGNOSIS — E119 Type 2 diabetes mellitus without complications: Secondary | ICD-10-CM | POA: Diagnosis not present

## 2023-09-04 DIAGNOSIS — Z515 Encounter for palliative care: Secondary | ICD-10-CM | POA: Diagnosis not present

## 2023-09-04 DIAGNOSIS — I639 Cerebral infarction, unspecified: Secondary | ICD-10-CM | POA: Diagnosis not present

## 2023-09-04 DIAGNOSIS — K922 Gastrointestinal hemorrhage, unspecified: Secondary | ICD-10-CM | POA: Diagnosis not present

## 2023-09-04 DIAGNOSIS — R195 Other fecal abnormalities: Secondary | ICD-10-CM | POA: Diagnosis not present

## 2023-09-04 DIAGNOSIS — I5032 Chronic diastolic (congestive) heart failure: Secondary | ICD-10-CM | POA: Diagnosis present

## 2023-09-04 DIAGNOSIS — D62 Acute posthemorrhagic anemia: Secondary | ICD-10-CM | POA: Diagnosis not present

## 2023-09-04 DIAGNOSIS — I10 Essential (primary) hypertension: Secondary | ICD-10-CM | POA: Diagnosis not present

## 2023-09-04 DIAGNOSIS — F05 Delirium due to known physiological condition: Secondary | ICD-10-CM | POA: Diagnosis not present

## 2023-09-04 DIAGNOSIS — I130 Hypertensive heart and chronic kidney disease with heart failure and stage 1 through stage 4 chronic kidney disease, or unspecified chronic kidney disease: Secondary | ICD-10-CM | POA: Diagnosis present

## 2023-09-04 DIAGNOSIS — Z66 Do not resuscitate: Secondary | ICD-10-CM | POA: Diagnosis present

## 2023-09-04 DIAGNOSIS — I6329 Cerebral infarction due to unspecified occlusion or stenosis of other precerebral arteries: Secondary | ICD-10-CM | POA: Diagnosis present

## 2023-09-04 DIAGNOSIS — N179 Acute kidney failure, unspecified: Secondary | ICD-10-CM | POA: Diagnosis not present

## 2023-09-04 DIAGNOSIS — I252 Old myocardial infarction: Secondary | ICD-10-CM | POA: Diagnosis not present

## 2023-09-04 DIAGNOSIS — Z7984 Long term (current) use of oral hypoglycemic drugs: Secondary | ICD-10-CM | POA: Diagnosis not present

## 2023-09-04 DIAGNOSIS — E1122 Type 2 diabetes mellitus with diabetic chronic kidney disease: Secondary | ICD-10-CM | POA: Diagnosis present

## 2023-09-04 DIAGNOSIS — G309 Alzheimer's disease, unspecified: Secondary | ICD-10-CM | POA: Diagnosis present

## 2023-09-04 DIAGNOSIS — D509 Iron deficiency anemia, unspecified: Secondary | ICD-10-CM | POA: Diagnosis present

## 2023-09-04 DIAGNOSIS — Z7189 Other specified counseling: Secondary | ICD-10-CM | POA: Diagnosis not present

## 2023-09-04 DIAGNOSIS — E8809 Other disorders of plasma-protein metabolism, not elsewhere classified: Secondary | ICD-10-CM | POA: Diagnosis present

## 2023-09-04 DIAGNOSIS — I6389 Other cerebral infarction: Secondary | ICD-10-CM | POA: Diagnosis not present

## 2023-09-04 DIAGNOSIS — F02811 Dementia in other diseases classified elsewhere, unspecified severity, with agitation: Secondary | ICD-10-CM | POA: Diagnosis not present

## 2023-09-04 DIAGNOSIS — D500 Iron deficiency anemia secondary to blood loss (chronic): Secondary | ICD-10-CM | POA: Diagnosis not present

## 2023-09-04 LAB — CBC WITH DIFFERENTIAL/PLATELET
Abs Immature Granulocytes: 0 10*3/uL (ref 0.00–0.07)
Basophils Absolute: 0 10*3/uL (ref 0.0–0.1)
Basophils Relative: 1 %
Eosinophils Absolute: 0.1 10*3/uL (ref 0.0–0.5)
Eosinophils Relative: 4 %
HCT: 23.8 % — ABNORMAL LOW (ref 39.0–52.0)
Hemoglobin: 7.9 g/dL — ABNORMAL LOW (ref 13.0–17.0)
Immature Granulocytes: 0 %
Lymphocytes Relative: 32 %
Lymphs Abs: 1.3 10*3/uL (ref 0.7–4.0)
MCH: 31.5 pg (ref 26.0–34.0)
MCHC: 33.2 g/dL (ref 30.0–36.0)
MCV: 94.8 fL (ref 80.0–100.0)
Monocytes Absolute: 0.3 10*3/uL (ref 0.1–1.0)
Monocytes Relative: 7 %
Neutro Abs: 2.3 10*3/uL (ref 1.7–7.7)
Neutrophils Relative %: 56 %
Platelets: 170 10*3/uL (ref 150–400)
RBC: 2.51 MIL/uL — ABNORMAL LOW (ref 4.22–5.81)
RDW: 13.7 % (ref 11.5–15.5)
WBC: 4.1 10*3/uL (ref 4.0–10.5)
nRBC: 0 % (ref 0.0–0.2)

## 2023-09-04 LAB — BRAIN NATRIURETIC PEPTIDE: B Natriuretic Peptide: 174.6 pg/mL — ABNORMAL HIGH (ref 0.0–100.0)

## 2023-09-04 LAB — GLUCOSE, CAPILLARY: Glucose-Capillary: 129 mg/dL — ABNORMAL HIGH (ref 70–99)

## 2023-09-04 LAB — COMPREHENSIVE METABOLIC PANEL
ALT: 14 U/L (ref 0–44)
AST: 16 U/L (ref 15–41)
Albumin: 2.8 g/dL — ABNORMAL LOW (ref 3.5–5.0)
Alkaline Phosphatase: 42 U/L (ref 38–126)
Anion gap: 6 (ref 5–15)
BUN: 23 mg/dL (ref 8–23)
CO2: 25 mmol/L (ref 22–32)
Calcium: 8.2 mg/dL — ABNORMAL LOW (ref 8.9–10.3)
Chloride: 107 mmol/L (ref 98–111)
Creatinine, Ser: 1.27 mg/dL — ABNORMAL HIGH (ref 0.61–1.24)
GFR, Estimated: 55 mL/min — ABNORMAL LOW (ref >60)
Glucose, Bld: 124 mg/dL — ABNORMAL HIGH (ref 70–99)
Potassium: 3.4 mmol/L — ABNORMAL LOW (ref 3.5–5.1)
Sodium: 138 mmol/L (ref 135–145)
Total Bilirubin: 1.1 mg/dL (ref 0.0–1.2)
Total Protein: 5.3 g/dL — ABNORMAL LOW (ref 6.5–8.1)

## 2023-09-04 LAB — HEMOGLOBIN AND HEMATOCRIT, BLOOD
HCT: 28.1 % — ABNORMAL LOW (ref 39.0–52.0)
Hemoglobin: 9.9 g/dL — ABNORMAL LOW (ref 13.0–17.0)

## 2023-09-04 LAB — RETICULOCYTES
Immature Retic Fract: 26.2 % — ABNORMAL HIGH (ref 2.3–15.9)
RBC.: 3.23 MIL/uL — ABNORMAL LOW (ref 4.22–5.81)
Retic Count, Absolute: 81.4 10*3/uL (ref 19.0–186.0)
Retic Ct Pct: 2.5 % (ref 0.4–3.1)

## 2023-09-04 LAB — PHOSPHORUS: Phosphorus: 3.3 mg/dL (ref 2.5–4.6)

## 2023-09-04 LAB — HEMOGLOBIN A1C
Hgb A1c MFr Bld: 5.9 % — ABNORMAL HIGH (ref 4.8–5.6)
Mean Plasma Glucose: 122.63 mg/dL

## 2023-09-04 LAB — PREPARE RBC (CROSSMATCH)

## 2023-09-04 LAB — ABO/RH: ABO/RH(D): O POS

## 2023-09-04 LAB — IRON AND TIBC
Iron: 41 ug/dL — ABNORMAL LOW (ref 45–182)
Saturation Ratios: 14 % — ABNORMAL LOW (ref 17.9–39.5)
TIBC: 304 ug/dL (ref 250–450)
UIBC: 263 ug/dL

## 2023-09-04 LAB — CBG MONITORING, ED: Glucose-Capillary: 114 mg/dL — ABNORMAL HIGH (ref 70–99)

## 2023-09-04 LAB — FERRITIN: Ferritin: 9 ng/mL — ABNORMAL LOW (ref 24–336)

## 2023-09-04 LAB — MAGNESIUM: Magnesium: 1.9 mg/dL (ref 1.7–2.4)

## 2023-09-04 MED ORDER — INSULIN ASPART 100 UNIT/ML IJ SOLN
0.0000 [IU] | Freq: Four times a day (QID) | INTRAMUSCULAR | Status: DC
  Administered 2023-09-05 – 2023-09-06 (×3): 1 [IU] via SUBCUTANEOUS

## 2023-09-04 MED ORDER — AMLODIPINE BESYLATE 2.5 MG PO TABS: 2.5000 mg | ORAL_TABLET | Freq: Every day | ORAL | Status: DC

## 2023-09-04 MED ORDER — HYDRALAZINE HCL 20 MG/ML IJ SOLN: 10.0000 mg | Freq: Four times a day (QID) | INTRAMUSCULAR | Status: DC | PRN

## 2023-09-04 MED ORDER — NITROGLYCERIN 0.4 MG SL SUBL: 0.4000 mg | SUBLINGUAL_TABLET | SUBLINGUAL | Status: DC | PRN

## 2023-09-04 MED ORDER — SODIUM CHLORIDE 0.9% IV SOLUTION: Freq: Once | INTRAVENOUS | Status: AC

## 2023-09-04 MED ORDER — ONDANSETRON HCL 4 MG/2ML IJ SOLN
4.0000 mg | Freq: Four times a day (QID) | INTRAMUSCULAR | Status: DC | PRN
Start: 2023-09-04 — End: 2023-09-09

## 2023-09-04 MED ORDER — POTASSIUM CHLORIDE 10 MEQ/100ML IV SOLN
10.0000 meq | INTRAVENOUS | Status: AC
  Administered 2023-09-05 (×2): 10 meq via INTRAVENOUS
  Filled 2023-09-04: qty 100

## 2023-09-04 MED ORDER — ACETAMINOPHEN 650 MG RE SUPP: 650.0000 mg | Freq: Four times a day (QID) | RECTAL | Status: DC | PRN

## 2023-09-04 MED ORDER — POTASSIUM CHLORIDE 10 MEQ/100ML IV SOLN
10.0000 meq | INTRAVENOUS | Status: DC
  Administered 2023-09-04 (×2): 10 meq via INTRAVENOUS
  Filled 2023-09-04 (×3): qty 100

## 2023-09-04 MED ORDER — ATORVASTATIN CALCIUM 40 MG PO TABS
40.0000 mg | ORAL_TABLET | Freq: Every day | ORAL | Status: DC
  Administered 2023-09-04 – 2023-09-05 (×2): 40 mg via ORAL
  Filled 2023-09-04 (×2): qty 1

## 2023-09-04 MED ORDER — AMIODARONE HCL 200 MG PO TABS
200.0000 mg | ORAL_TABLET | Freq: Every day | ORAL | Status: DC
  Administered 2023-09-04 – 2023-09-09 (×6): 200 mg via ORAL
  Filled 2023-09-04 (×6): qty 1

## 2023-09-04 MED ORDER — ACETAMINOPHEN 325 MG PO TABS: 650.0000 mg | ORAL_TABLET | Freq: Four times a day (QID) | ORAL | Status: DC | PRN

## 2023-09-04 MED ORDER — PANTOPRAZOLE SODIUM 40 MG IV SOLR
40.0000 mg | Freq: Once | INTRAVENOUS | Status: AC
  Administered 2023-09-04: 40 mg via INTRAVENOUS
  Filled 2023-09-04: qty 10

## 2023-09-04 MED ORDER — LACTATED RINGERS IV BOLUS
500.0000 mL | Freq: Once | INTRAVENOUS | Status: AC
  Administered 2023-09-04: 500 mL via INTRAVENOUS

## 2023-09-04 MED ORDER — PANTOPRAZOLE SODIUM 40 MG IV SOLR
40.0000 mg | Freq: Two times a day (BID) | INTRAVENOUS | Status: DC
  Administered 2023-09-04 – 2023-09-07 (×8): 40 mg via INTRAVENOUS
  Filled 2023-09-04 (×8): qty 10

## 2023-09-04 MED ORDER — FUROSEMIDE 10 MG/ML IJ SOLN
20.0000 mg | Freq: Once | INTRAMUSCULAR | Status: AC
  Administered 2023-09-04: 20 mg via INTRAVENOUS
  Filled 2023-09-04: qty 2

## 2023-09-04 MED ORDER — MEMANTINE HCL 10 MG PO TABS
10.0000 mg | ORAL_TABLET | Freq: Two times a day (BID) | ORAL | Status: DC
  Administered 2023-09-04 – 2023-09-09 (×10): 10 mg via ORAL
  Filled 2023-09-04 (×10): qty 1

## 2023-09-04 MED ORDER — ADULT MULTIVITAMIN W/MINERALS CH
1.0000 | ORAL_TABLET | Freq: Every day | ORAL | Status: DC
  Administered 2023-09-04 – 2023-09-09 (×6): 1 via ORAL
  Filled 2023-09-04 (×8): qty 1

## 2023-09-04 NOTE — Progress Notes (Signed)
 PROGRESS NOTE    Alexander Donaldson  ZOX:[id number] DOB: 1934-01-28 DOA: 09/03/2023 PCP: [facility]   Brief Narrative:  The patient is an 89 year old significant for but not limited to advanced dementia, paroxysmal atrial fibrillation on anticoagulation with apixaban, diabetes mellitus type 2, essential hypertension, chronic systolic and diastolic CHF as well as other comorbidities who presented to [facility] on 09/03/2023 with a right facial droop, slurred speech, and inability to ambulate.  Further workup was done and he has now a acute CVA so neurology's been consulted.  Further workup was also done and showed that his hemoglobin started dropping since the last few months and it was noted to be 12.8 on 07/04/2023 and is dropped to 8.1 on admission so FOBT was obtained this was positive.  GIs been consulted and pending on goals of care may be considering doing EGD.  Currently his anticoagulation has been held and neurology is to see the patient but in the interim we will workup the patient with the basic stroke workup.  Assessment and Plan:  Acute Right [place] CVA -Symptoms included right facial droop, slurred speech and inability to ambulate and presented and had a head CT which showed no acute intracranial abnormality and generalized atrophy with chronic microvascular disease findings -MRI of the brain done was significantly motion degraded but did show a 16 mm acute infarct in the right aspect of the pons along with mild to moderate chronic small vessel disease changes within the cerebral white matter and a chronic left thalamic lacunar infarct.  As well as generalized cerebral and cerebellar atrophy -Neurology to be consulted for further evaluation and recommendations -Check ECHO, PT/OT/SLP, Lipid Panel, HbA1c, and CTA Head and Neck, Neurochecks per Protocol and Telemetry Monitoring   Suspected GI Bleeding and Heme Positive Stools -Holding his anticoagulation and aspirin  as he is +FOBT -Has had a 4 g drop in the last few months -He is typed and screened and transfused 1 unit PRBCs and GI has been consulted recommend keeping hemoglobin above 8 given his advanced age, comorbidities and acute CVA -Anemia panel done and showed an iron level 41, UIBC 263, TIBC of 304, saturation ratios of 14%, ferritin level of 9 -Hgb/Hct Trend: Recent Labs  Lab 09/03/23 1848 09/04/23 1133 09/04/23 1646  HGB 8.1* 7.9* 9.9*  HCT 24.8* 23.8* 28.1*  MCV 96.1 94.8  --   -Continue to Monitor for S/Sx of Bleeding; Repeat CBC in the AM   Chronic Systolic and Diastolic CHF -Given the diuresis after blood transfusion -Strict I's and O's and Daily Weights;  Intake/Output Summary (Last 24 hours) at 09/04/2023 1946 Last data filed at 09/04/2023 1721 Gross per 24 hour  Intake 919.84 ml  Output 2700 ml  Net -1780.16 ml  -Monitor for signs and symptoms of volume overload  Paroxysmal Atrial Fibrillation -Hold Anticoagulation given concern for GI Bleed; CHA2DS2-VASc of 5 -Continue to Monitor on the Progressive Unit Telemetry  -Resume home Amiodarone once cleared by SLP  Essential Hypertension -Holding his antihypertensives for now until he is able to swallow -Given his acute CVA will allow for permissive hypertension as well -Continue To monitor blood pressures per protocol  AKI on CKD Stage 3b -BUN/Cr Trend improving: Recent Labs  Lab 09/03/23 1848 09/04/23 1133  BUN 34* 23  CREATININE 1.72* 1.27*  -Avoid Nephrotoxic Medications, Contrast Dyes, Hypotension and Dehydration to Ensure Adequate Renal Perfusion and will need to Renally Adjust Meds -Continue to Monitor and Trend Renal Function carefully and  repeat CMP in the AM   Diabetes Mellitus Type 2 -Repeat hemoglobin A1c is 5.9 -Monitor CBGs per protocol with very sensitive NovoLog/scale insulin AC  Hypokalemia -Patient's K+ Level Trend: Recent Labs  Lab 09/03/23 1848 09/04/23 1133  K 4.1 3.4*  -Replete with IV Kcl  40 mEQ -Continue to Monitor and Replete as Necessary -Repeat CMP in the AM   Hypoalbuminemia -Patient's Albumin Trend: Recent Labs  Lab 09/03/23 1848 09/04/23 1133  ALBUMIN 3.4* 2.8*  -Continue to Monitor and Trend and repeat CMP in the AM  Overweight -Complicates overall prognosis and care -Estimated body mass index is 25.43 kg/m as calculated from the following:   Height as of this encounter: 5\' 4"  (1.626 m).   Weight as of this encounter: 67.2 kg.  -Weight Loss and Dietary Counseling given   DVT prophylaxis: SCDs Start: 09/04/23 0121    Code Status: Limited: Do not attempt resuscitation (DNR) -DNR-LIMITED -Do Not Intubate/DNI  Family Communication: Discussed with the wife at bedside  Disposition Plan:  Level of care: Progressive Status is: Inpatient Remains inpatient appropriate because: Needs further clinical workup and improvement    Consultants:  Gastroenterology  Neurology  Procedures:  As delineated as above  Antimicrobials:  Anti-infectives (From admission, onward)    None       Objective: Vitals:   09/04/23 1500 09/04/23 1615 09/04/23 1827 09/04/23 1945  BP: (!) 171/72 (!) 179/74 (!) 144/91 (!) 161/82  Pulse: (!) 56 63 67 66  Resp: 18 18  16   Temp:  97.9 F (36.6 C)  98.6 F (37 C)  TempSrc:  Oral  Oral  SpO2: 100% 97%  100%  Weight:  67.2 kg    Height:  5\' 4"  (1.626 m)      Intake/Output Summary (Last 24 hours) at 09/04/2023 1950 Last data filed at 09/04/2023 1721 Gross per 24 hour  Intake 919.84 ml  Output 2700 ml  Net -1780.16 ml   Filed Weights   09/04/23 1615  Weight: 67.2 kg   Data Reviewed: I have personally reviewed following labs and imaging studies  CBC: Recent Labs  Lab 09/03/23 1848 09/04/23 1133 09/04/23 1646  WBC 5.5 4.1  --   NEUTROABS 2.7 2.3  --   HGB 8.1* 7.9* 9.9*  HCT 24.8* 23.8* 28.1*  MCV 96.1 94.8  --   PLT 211 170  --    Basic Metabolic Panel: Recent Labs  Lab 09/03/23 1848 09/04/23 1133  NA 137  138  K 4.1 3.4*  CL 103 107  CO2 24 25  GLUCOSE 160* 124*  BUN 34* 23  CREATININE 1.72* 1.27*  CALCIUM 8.7* 8.2*  MG  --  1.9  PHOS  --  3.3   GFR: Estimated Creatinine Clearance: 34.3 mL/min (A) (by C-G formula based on SCr of 1.27 mg/dL (H)). Liver Function Tests: Recent Labs  Lab 09/03/23 1848 09/04/23 1133  AST 19 16  ALT 16 14  ALKPHOS 47 42  BILITOT 0.7 1.1  PROT 6.3* 5.3*  ALBUMIN 3.4* 2.8*   No results for input(s): "LIPASE", "AMYLASE" in the last 168 hours. No results for input(s): "AMMONIA" in the last 168 hours. Coagulation Profile: Recent Labs  Lab 09/03/23 1848  INR 1.7*   Cardiac Enzymes: No results for input(s): "CKTOTAL", "CKMB", "CKMBINDEX", "TROPONINI" in the last 168 hours. BNP (last 3 results) No results for input(s): "PROBNP" in the last 8760 hours. HbA1C: Recent Labs    09/04/23 1133  HGBA1C 5.9*   CBG: Recent  Labs  Lab 09/04/23 1207 09/04/23 1637  GLUCAP 114* 129*   Lipid Profile: No results for input(s): "CHOL", "HDL", "LDLCALC", "TRIG", "CHOLHDL", "LDLDIRECT" in the last 72 hours. Thyroid Function Tests: No results for input(s): "TSH", "T4TOTAL", "FREET4", "T3FREE", "THYROIDAB" in the last 72 hours. Anemia Panel: Recent Labs    09/04/23 1133 09/04/23 1646  FERRITIN 9*  --   TIBC 304  --   IRON 41*  --   RETICCTPCT  --  2.5   Sepsis Labs: Recent Labs  Lab 09/03/23 1951  LATICACIDVEN 1.7   Recent Results (from the past 240 hours)  Blood culture (routine x 2)     Status: None (Preliminary result)   Collection Time: 09/03/23  6:57 PM   Specimen: BLOOD LEFT HAND  Result Value Ref Range Status   Specimen Description BLOOD LEFT HAND  Final   Special Requests   Final    BOTTLES DRAWN AEROBIC AND ANAEROBIC Blood Culture results may not be optimal due to an inadequate volume of blood received in culture bottles   Culture   Final    NO GROWTH < 12 HOURS Performed at [facility], [address], [place], [place]  [address]    Report Status PENDING  Incomplete  Blood culture (routine x 2)     Status: None (Preliminary result)   Collection Time: 09/03/23  7:02 PM   Specimen: BLOOD  Result Value Ref Range Status   Specimen Description BLOOD RIGHT ANTECUBITAL  Final   Special Requests   Final    BOTTLES DRAWN AEROBIC AND ANAEROBIC Blood Culture adequate volume   Culture   Final    NO GROWTH < 12 HOURS Performed at [facility], [address] [place], [place] [address]    Report Status PENDING  Incomplete    Radiology Studies: MR Brain Wo Contrast (neuro protocol) Result Date: 09/04/2023 CLINICAL DATA:  Provided history: Transient ischemic attack (TIA). Additional history provided: Facial droop, slurred speech, weakness, confusion, agitation. EXAM: MRI HEAD WITHOUT CONTRAST TECHNIQUE: Multiplanar, multiecho pulse sequences of the brain and surrounding structures were obtained without intravenous contrast. COMPARISON:  Head CT 09/03/2023.  Brain MRI 03/31/2020. FINDINGS: Significantly motion degraded examination, limiting evaluation. Most notably, the axial diffusion-weighted sequence is severely motion degraded, the axial SWI sequence is severely motion degraded and the axial FLAIR sequence is severely motion degraded. Within these limitations, findings are as follows. Brain: Moderate-to-advanced generalized cerebral atrophy. Commensurate prominence of the ventricles sulci. Mild cerebellar atrophy. 16 mm acute infarct within the right aspect of the pons (for instance as seen on series 3, image 16) (series 4, image 17). Multifocal T2 FLAIR hyperintense signal abnormality within the cerebral white matter, nonspecific but compatible with mild-to-moderate chronic small vessel ischemic disease. Chronic lacunar infarct within the left thalamus. No evidence of an intracranial mass. No extra-axial fluid collection. No midline shift. Vascular: Maintained flow voids within the proximal large arterial vessels. Skull  and upper cervical spine: No focal worrisome marrow lesion. Ligamentous hypertrophy about the dens. Incompletely assessed cervical spondylosis. Sinuses/Orbits: No mass or acute finding within the imaged orbits. Mild mucosal thickening within a posterior right ethmoid air cell. IMPRESSION: 1. Significantly motion degraded examination. Within this limitation, findings are as follows. 2. 16 mm acute infarct within the right aspect of the pons. 3. Mild-to-moderate chronic small vessel ischemic changes within the cerebral white matter. 4. Chronic left thalamic lacunar infarct. 5. Moderate-to-advanced generalized cerebral atrophy. 6. Mild cerebellar atrophy. 7. Minor right ethmoid  sinus disease. Electronically Signed   By: Jackey Loge D.O.   On: 09/04/2023 10:21   CT HEAD WO CONTRAST ( ) Result Date: 09/03/2023 CLINICAL DATA:  Right lower extremity weakness EXAM: CT HEAD WITHOUT CONTRAST TECHNIQUE: Contiguous axial images were obtained from the base of the skull through the vertex without intravenous contrast. RADIATION DOSE REDUCTION: This exam was performed according to the departmental dose-optimization program which includes automated exposure control, adjustment of the mA and/or kV according to patient size and/or use of iterative reconstruction technique. COMPARISON:  07/02/2023 FINDINGS: Brain: There is no mass, hemorrhage or extra-axial collection. There is generalized atrophy without lobar predilection. Hypodensity of the white matter is most commonly associated with chronic microvascular disease. Vascular: No hyperdense vessel or unexpected vascular calcification. Skull: The visualized skull base, calvarium and extracranial soft tissues are normal. Sinuses/Orbits: No fluid levels or advanced mucosal thickening of the visualized paranasal sinuses. No mastoid or middle ear effusion. Normal orbits. Other: None. IMPRESSION: 1. No acute intracranial abnormality. 2. Generalized atrophy and findings of chronic  microvascular disease. Electronically Signed   By: Deatra Robinson M.D.   On: 09/03/2023 20:08   Scheduled Meds:  amiodarone  200 mg Oral Daily   atorvastatin  40 mg Oral Daily   insulin aspart  0-6 Units Subcutaneous Q6H   memantine  10 mg Oral BID   multivitamin with minerals  1 tablet Oral Daily   pantoprazole (PROTONIX) IV  40 mg Intravenous Q12H   Continuous Infusions:  potassium chloride      LOS: 0 days   Marguerita Merles, DO [facility] Available via Epic secure chat 7am-7pm After these hours, please refer to coverage provider listed on [email or web address] 09/04/2023, 7:50 PM

## 2023-09-04 NOTE — ED Provider Notes (Signed)
 Care assumed from Dr. Theresia Lo.  Patient with a history of dementia, A-fib on Eliquis here with concern for feeling off balance, shuffling gait and difficulty walking since at least midnight and questionable difficulty using right arm.  Pending MRI.  Also found to have likely GI bleed and drop in hemoglobin 4 g with melanotic stools on Eliquis.  Stable vitals.  Will need admission  Wife at bedside consents to packed red blood cell transfusion.  Discussed with Dr. Amada Jupiter of neurology.  As patient is already on Eliquis, would not recommend additional neurological TIA workup unless MRI is positive.  Recommendations relayed to Dr. Arlean Hopping who is admitting patient for medicine.  IV Protonix ordered as well as 1 unit of packed red blood cells.  .Critical Care  Performed by: Glynn Octave, MD Authorized by: Glynn Octave, MD   Critical care provider statement:    Critical care time (minutes):  30   Critical care time was exclusive of:  Separately billable procedures and treating other patients   Critical care was necessary to treat or prevent imminent or life-threatening deterioration of the following conditions: GI bleeding.   Critical care was time spent personally by me on the following activities:  Development of treatment plan with patient or surrogate, discussions with consultants, evaluation of patient's response to treatment, examination of patient, ordering and review of laboratory studies, ordering and review of radiographic studies, ordering and performing treatments and interventions, pulse oximetry, re-evaluation of patient's condition, review of old charts, blood draw for specimens and obtaining history from patient or surrogate   I assumed direction of critical care for this patient from another provider in my specialty: no     Care discussed with: admitting provider       Glynn Octave, MD 09/04/23 0107

## 2023-09-04 NOTE — Progress Notes (Signed)
 SLP Cancellation Note  Patient Details Name: Alexander Donaldson MRN: [id number] DOB: [birth year]   Cancelled treatment:       Reason Eval/Treat Not Completed: Patient at procedure or test/unavailable. Will continue to f/u.   Gwynneth Aliment, M.A., CF-SLP Speech Language Pathology, Acute Rehabilitation Services  Secure Chat preferred [phone]  09/04/2023, 4:45 PM

## 2023-09-04 NOTE — Consult Note (Addendum)
 Consultation  Referring Provider: [facility] Primary Care Physician:  [facility] Primary Gastroenterologist: Gentry Fitz  Reason for Consultation: Weakness, anemia, heme positive stool  HPI: Alexander Donaldson is a 89 y.o. male with history of Alzheimer's dementia, coronary artery disease status post multiple stents, diabetes mellitus, atrial fibrillation, just of heart failure prior MI, who is on chronic Eliquis and aspirin at home. He was brought to the emergency room after his wife noticed a change in his status yesterday with slurring of speech, right facial droop, change in balance with inability to ambulate. Admitting labs showed hemoglobin of 8.1, normocytic platelets 211 INR 1.7/pro time 19.9 Documented dark heme positive stool per ER BUN 30/creatinine 1.72 Albumin 3.4 LFTs within normal limits  Hemoglobin this a.m. 7.9/hematocrit 23.8/MCV 94  CT of the head without contrast yesterday, no acute intracranial abnormality generalized atrophy and findings consistent with chronic microvascular disease MRI of the brain today-reveals moderate to advanced generalized cerebral atrophy, 16 mm acute infarct within the right aspect of the pons, multifocal T2 flair hyperintense signal abnormality within the cerebral white matter nonspecific but compatible with chronic small vessel disease, chronic lacunar infarct left thalamus.  Previous CT abdomen and pelvis September 2024-no acute findings, normal-appearing liver, cholelithiasis, colonic diverticulosis and left nephrolithiasis  Patient required sedation for MRI this morning, has had Ativan, is unable to participate in the conversation today. Wife says his appetite has not been great recently, and due to his dementia has required help eating.  He has had some recent hiccuping.  She has not noted any dysphagia or odynophagia, has had no complaints of abdominal pain.  He is usually able to get to the bathroom by himself, and has  not mentioned anything about dark stools, wife has not seen any dark stools or heme. He has not had any prior GI evaluation. No family history of GI malignancies that they are aware of.   Past Medical History:  Diagnosis Date   Acute encephalopathy 02/12/2018   Alzheimer's dementia (HCC)    CAD (coronary artery disease)    CAP (community acquired pneumonia) 02/11/2018   Carpal tunnel syndrome, bilateral 02/25/2020   Coronary artery disease    Diabetes mellitus    Diverticulitis    Hearing loss    Hypertension    Hypokalemia 02/12/2018   NSTEMI (non-ST elevated myocardial infarction) [facility])     Past Surgical History:  Procedure Laterality Date   ANGIOPLASTY     CORONARY STENT INTERVENTION N/A 02/02/2017   Procedure: Coronary Stent Intervention;  Surgeon: Rinaldo Cloud, MD;  Location: [facility] INVASIVE CV LAB;  Service: Cardiovascular;  Laterality: N/A;   CORONARY STENTS     LEFT HEART CATH AND CORONARY ANGIOGRAPHY N/A 02/02/2017   Procedure: Left Heart Cath and Coronary Angiography;  Surgeon: Rinaldo Cloud, MD;  Location: [facility] INVASIVE CV LAB;  Service: Cardiovascular;  Laterality: N/A;   LEFT HEART CATHETERIZATION WITH CORONARY ANGIOGRAM N/A 03/16/2012   Procedure: LEFT HEART CATHETERIZATION WITH CORONARY ANGIOGRAM;  Surgeon: Robynn Pane, MD;  Location: [facility] CATH LAB;  Service: Cardiovascular;  Laterality: N/A;   PERCUTANEOUS CORONARY STENT INTERVENTION (PCI-S)  03/16/2012   Procedure: PERCUTANEOUS CORONARY STENT INTERVENTION (PCI-S);  Surgeon: Robynn Pane, MD;  Location: [facility] CATH LAB;  Service: Cardiovascular;;   TONSILLECTOMY      Prior to Admission medications   Medication Sig Start Date End Date Taking? Authorizing Provider  amiodarone (PACERONE) 200 MG tablet Take 1 tablet (200 mg total) by mouth daily. 07/05/23  Yes Rinaldo Cloud, MD  amLODipine (NORVASC) 5 MG tablet Take 2.5 mg by mouth daily. 07/18/23  Yes [provider]  apixaban (ELIQUIS) 5 MG TABS tablet Take 1  tablet (5 mg total) by mouth 2 (two) times daily. 07/04/23  Yes Rinaldo Cloud, MD  aspirin 81 MG tablet Take 81 mg by mouth daily.   Yes [provider]  Atorvastatin Calcium 20 MG/5ML SUSP Take 10 mLs by mouth daily.   Yes [provider]  diclofenac sodium (VOLTAREN) 1 % GEL Apply 2 g topically 4 (four) times daily as needed (pain). 11/01/18  Yes Melene Plan, DO  losartan (COZAAR) 100 MG tablet Take 100 mg by mouth daily.    Yes [provider]  Memantine HCl 10 MG/5ML SOLN Take 5 mLs by mouth 2 (two) times daily at 10 AM and 5 PM. 06/22/23  Yes Micki Riley, MD  Multiple Vitamin (MULTIVITAMIN) tablet Take 1 tablet by mouth daily.   Yes [provider]  polyethylene glycol (MIRALAX / GLYCOLAX) 17 g packet Take 17 g by mouth daily as needed for mild constipation. 08/07/23  Yes [provider]  nitroGLYCERIN (NITROSTAT) 0.4 MG SL tablet Place 1 tablet (0.4 mg total) under the tongue every 5 (five) minutes x 3 doses as needed for chest pain. 03/18/12   Rinaldo Cloud, MD    Current Facility-Administered Medications  Medication Dose Route Frequency Provider Last Rate Last Admin   acetaminophen (TYLENOL) tablet 650 mg  650 mg Oral Q6H PRN Howerter, Justin B, DO       Or   acetaminophen (TYLENOL) suppository 650 mg  650 mg Rectal Q6H PRN Howerter, Justin B, DO       furosemide (LASIX) injection 20 mg  20 mg Intravenous Once Howerter, Justin B, DO       insulin aspart (novoLOG) injection 0-6 Units  0-6 Units Subcutaneous Q6H Howerter, Justin B, DO       ondansetron (ZOFRAN) injection 4 mg  4 mg Intravenous Q6H PRN Howerter, Justin B, DO       pantoprazole (PROTONIX) injection 40 mg  40 mg Intravenous Q12H Howerter, Justin B, DO       Current Outpatient Medications  Medication Sig Dispense Refill   amiodarone (PACERONE) 200 MG tablet Take 1 tablet (200 mg total) by mouth daily. 30 tablet 3   amLODipine (NORVASC) 5 MG tablet Take 2.5 mg by mouth daily.      apixaban (ELIQUIS) 5 MG TABS tablet Take 1 tablet (5 mg total) by mouth 2 (two) times daily. 60 tablet 3   aspirin 81 MG tablet Take 81 mg by mouth daily.     Atorvastatin Calcium 20 MG/5ML SUSP Take 10 mLs by mouth daily.     diclofenac sodium (VOLTAREN) 1 % GEL Apply 2 g topically 4 (four) times daily as needed (pain). 100 g 0   losartan (COZAAR) 100 MG tablet Take 100 mg by mouth daily.      Memantine HCl 10 MG/5ML SOLN Take 5 mLs by mouth 2 (two) times daily at 10 AM and 5 PM. 300 mL 3   Multiple Vitamin (MULTIVITAMIN) tablet Take 1 tablet by mouth daily.     polyethylene glycol (MIRALAX / GLYCOLAX) 17 g packet Take 17 g by mouth daily as needed for mild constipation.     nitroGLYCERIN (NITROSTAT) 0.4 MG SL tablet Place 1 tablet (0.4 mg total) under the tongue every 5 (five) minutes x 3 doses as needed for chest pain.  25 tablet 3    Allergies as of 09/03/2023 - Review Complete 07/02/2023  Allergen Reaction Noted   Lisinopril  10/04/2012   Metformin Diarrhea 08/05/2015    History reviewed. No pertinent family history.  Social History   Socioeconomic History   Marital status: Married    Spouse name: Clydie Braun   Number of children: Not on file   Years of education: Not on file   Highest education level: Not on file  Occupational History   Not on file  Tobacco Use   Smoking status: Never   Smokeless tobacco: Never  Vaping Use   Vaping status: Never Used  Substance and Sexual Activity   Alcohol use: No    Comment: social   Drug use: No   Sexual activity: Yes    Birth control/protection: None  Other Topics Concern   Not on file  Social History Narrative   Lives with wife   Right Handed   Drinks 1-2 cups caffeine daily   Social Drivers of Health   Financial Resource Strain: Low Risk  (07/02/2023)   Overall Financial Resource Strain (CARDIA)    Difficulty of Paying Living Expenses: Not very hard  Food Insecurity: No Food Insecurity (07/02/2023)   Hunger Vital Sign     Worried About Running Out of Food in the Last Year: Never true    Ran Out of Food in the Last Year: Never true  Transportation Needs: No Transportation Needs (07/02/2023)   PRAPARE - Administrator, Civil Service (Medical): No    Lack of Transportation (Non-Medical): No  Physical Activity: Inactive (07/02/2023)   Exercise Vital Sign    Days of Exercise per Week: 0 days    Minutes of Exercise per Session: 0 min  Stress: Patient Unable To Answer (07/02/2023)   [employer] of Occupational Health - Occupational Stress Questionnaire    Feeling of Stress : Patient unable to answer  Social Connections: Socially Isolated (07/02/2023)   Social Connection and Isolation Panel [NHANES]    Frequency of Communication with Friends and Family: Never    Frequency of Social Gatherings with Friends and Family: Never    Attends Religious Services: Never    Database administrator or Organizations: No    Attends Banker Meetings: Never    Marital Status: Married  Catering manager Violence: Not At Risk (07/02/2023)   Humiliation, Afraid, Rape, and Kick questionnaire    Fear of Current or Ex-Partner: No    Emotionally Abused: No    Physically Abused: No    Sexually Abused: No    Review of Systems: Pertinent positive and negative review of systems were noted in the above HPI section.  All other review of systems was otherwise negative.  Patient unable to offer Physical Exam: Vital signs in last 24 hours: Temp:  [98 F (36.7 C)-98.9 F (37.2 C)] 98 F (36.7 C) (02/24 1058) Pulse Rate:  [54-126] 57 (02/24 1058) Resp:  [12-19] 18 (02/24 1058) BP: (119-169)/(54-103) 169/59 (02/24 1058) SpO2:  [97 %-100 %] 100 % (02/24 1058)   General:   Alert,  Well-developed, well-nourished, elderly African-American male , somewhat sedated post Ativan, unable to participate in conversation, wife at bedside Head:  Normocephalic and atraumatic. Eyes:  Sclera clear, no icterus.    Conjunctiva pink. Ears:  Normal auditory acuity. Nose:  No deformity, discharge,  or lesions. Mouth: Right facial droop Neck:  Supple; no masses or thyromegaly. Lungs:  Clear throughout to auscultation.   No  wheezes, crackles, or rhonchi.  Heart:  Regular rate and rhythm; no murmurs, clicks, rubs,  or gallops. Abdomen:  Soft,nontender, BS active,nonpalp mass or hsm.   Rectal: Not done, documented heme positive Msk:  Symmetrical without gross deformities. . Pulses:  Normal pulses noted. Extremities:  Without clubbing or edema. Neurologic:  Alert , currently a bit sleepy, speech is garbled, Skin:  Intact without significant lesions or rashes.. Psych:  Alert  Intake/Output from previous day: 02/23 0701 - 02/24 0700 In: 919.8 [I.V.:22.2; Blood:397.3; IV Piggyback:500.3] Out: -  Intake/Output this shift: Total I/O In: -  Out: 1000 [Urine:1000]  Lab Results: Recent Labs    09/03/23 1848  WBC 5.5  HGB 8.1*  HCT 24.8*  PLT 211   BMET Recent Labs    09/03/23 1848  NA 137  K 4.1  CL 103  CO2 24  GLUCOSE 160*  BUN 34*  CREATININE 1.72*  CALCIUM 8.7*   LFT Recent Labs    09/03/23 1848  PROT 6.3*  ALBUMIN 3.4*  AST 19  ALT 16  ALKPHOS 47  BILITOT 0.7   PT/INR Recent Labs    09/03/23 1848  LABPROT 19.9*  INR 1.7*   Hepatitis Panel No results for input(s): "HEPBSAG", "HCVAB", "HEPAIGM", "HEPBIGM" in the last 72 hours.   IMPRESSION:  #36 89 year old African-American male brought to the emergency room after wife noticed slurred speech, right facial droop onset yesterday, inability to ambulate. This is in the setting of chronic Eliquis and aspirin at home  MRI today confirms acute CVA involving right aspect of the pons, generalized atrophy and mild to moderate chronic small vessel ischemic changes  #2 Alzheimer's dementia #3 coronary artery disease status post prior MI, multiple stents, #4 atrial fibrillation #5.  Congestive heart failure #6 diabetes  mellitus  #7 normocytic anemia, heme positive stool in setting of chronic anticoagulation and aspirin.  With 4 g drop in hemoglobin over the past 2-1/2 months. This is consistent with slow GI blood loss.  Will need to consider upper and lower GI sources as patient has not had any prior GI evaluation.  Rule out aspirin induced peptic ulcer disease, chronic gastropathy, AVMs, occult neoplasm  Plan; bedside swallow eval as patient is having some coughing/choking with his secretions currently N.p.o. until after evaluated by speech path Twice daily PPI Trend hemoglobin and transfuse to keep hemoglobin 8 given advanced age, comorbidities and acute CVA We will consider EGD, he would not be a candidate for colonoscopy at present as having dysphagia/neurogenic and would not be able to tolerate a prep. Will discuss further with hospitalist regarding timing of EGD-this can be done diagnostic with him on anticoagulation.  Amy Esterwood PA-C 09/04/2023, 11:45 AM   I have taken an interval history, thoroughly reviewed the chart and examined the patient. I agree with the Advanced Practitioner's note, impression and recommendations, and have recorded additional findings, impressions and recommendations below. I performed a substantive portion of this encounter (>50% time spent), including a complete performance of the medical decision making.  (The APP and I saw this patient together in the ED, and Mr. Fore wife was present for the entire visit)  My additional thoughts are as follows:  89 year old man with CAD and multiple prior interventions, recent diagnosis A-fib and non-STEMI, now on anticoagulation, here with acute neurologic symptoms and new diagnosis of anemia with heme positive brown stool.  On aspirin and Eliquis, raises suspicion for possible upper GI source/peptic ulcer disease.  No overt GI bleeding reported  or witnessed since arrival in ED. Unfortunately, since we saw him earlier today, his MRI  (with significant motion degradation) has confirmed an acute brainstem CVA.  While in endoscopic workup for his IDA could be pursued, perhaps the most I would be inclined to do at this point as an upper endoscopy.  Even that would be an increased risk procedure due to his current condition.  Colonoscopy and its preparation would appear to be prohibitively challenging at this point, also with significantly increased risk for the same reasons. We will check back with his family tomorrow and address goals of care to see how far they would like [place] to reasonably pursue this.    Charlie Pitter III [phone]

## 2023-09-04 NOTE — ED Notes (Signed)
 Pt has large bowl movement. Pt cleaned, sheets changed.

## 2023-09-04 NOTE — ED Notes (Signed)
 Pt refusing labs. Pulling away. Will try again.

## 2023-09-04 NOTE — Plan of Care (Signed)
   Problem: Coping: Goal: Ability to adjust to condition or change in health will improve Outcome: Progressing

## 2023-09-04 NOTE — ED Notes (Signed)
 Ativan given piror to MRI, Pt to MRI at this time

## 2023-09-04 NOTE — Progress Notes (Signed)
 Patient is sleepy, not alert at this time to do swallow evaluation.

## 2023-09-04 NOTE — ED Notes (Signed)
 Lab attempted to draw at this time. Was unable due pt movement and agitation

## 2023-09-04 NOTE — H&P (Signed)
 History and Physical      Alexander Donaldson ZOX:[id number] DOB: [birth year] DOA: 09/03/2023; DOS: 09/04/2023  PCP: [facility]  Patient coming from: home   I have personally briefly reviewed patient's old medical records in [facility]  Chief Complaint: facial droop  HPI: Alexander Donaldson is a 89 y.o. male with medical history significant for advanced dementia, paroxysmal atrial fibrillation chronically anticoagulated on Eliquis, type 2 diabetes mellitus, essential hypertension, chronic systolic/diastolic heart failure, who is admitted to [facility] on 09/03/2023 with suspected acute upper gastrointestinal bleed complicated by acute blood loss anemia after presenting from home to [facility] ED complaining of facial droop.   In the context of the patient's advanced nature, following history was provided by the patient's wife, who is present at bedside, in addition to my discussions with the EDP and via chart review.  RN conveys that the patient was at his baseline level of health and eating dinner around 1800 on 09/03/2023, when he developed acute onset right-sided facial droop associated with acute onset of right upper extremity weakness, having difficulty raising his fork to enjoy his meal.  EMS was contacted as a result of these findings, although the right-sided facial droop and right extremity weakness had spontaneously resolved and complete fashion prior to arrival of EMS, without subsequent recurrence of the symptoms.  He was subsequent brought to [facility] emergency department for further evaluation management of the above.  Wife conveys no knowledge of any recent or hematochezia.  She states that the patient has not been giving any recent abdominal discomfort or any recent nausea, vomiting, hematemesis.  He is chronically anticoagulated on Eliquis in the setting of paroxysmal atrial fibrillation.  No known history of underlying liver disease, and no history of chronic or  routine alcohol consumption.  Per chart review, patient's baseline hemoglobin appears to be in the range of 12-14, with most recent prior hemoglobin noted to be 12.8 on 07/04/2023.    ED Course:  Vital signs in the ED were notable for the following: Afebrile; heart rates in the 50s to 70s; systolic pressures in the 120s to 140s; respiratory rate 15-18, oxygen saturation 97% on room air.  Labs were notable for the following: CMP was notable for the following: BUN 34 compared to 28 on 07/04/2023, creatinine 1.72 compared to 1.31 on 07/04/2023, glucose 160.  Calcium adjusted for mild hypoalbuminemia noted to be 9.1, avidin 3.4, otherwise, liver enzymes are within normal limits.  CBC notable for will as above 5500, hemoglobin 8.1 associated Neuraceq/Norocarp properties and nonelevated RDW, platelet count 211.  INR 1.7.  DRE revealed dark-colored stool that was fecal occult blood positive.  Urinalysis showed no blood cells, was leukocyte esterase/nitrate negative and showed the presence of hyaline cast.  Per my interpretation, EKG in ED demonstrated the following: Sinus rhythm with heart rate 67, normal intervals, no evidence of T wave or ST changes, Cleen evidence of ST ovation.  Imaging in the ED, per corresponding formal radiology read, was notable for the following: Noncontrast CT head showed no evidence of acute intracranial process, including eccentric reassurance of acute infarct.  MRI brain has been ordered, Therazole currently pending.  EDP has contacted on-call [facility] who will see patient in the AM. On iv protonix. Receiving 1 unit prbc. Also, EDP d/w on-call neurology, Dr. Amada Jupiter, who felt that the transient facial droop and RUE weakness may have been as a result of the patient's GI bleed with acute blood loss anemia.  If MRI brain is negative, neurology recommends no additional workup for the facial droop/rue weakness.   While in the ED, the following were administered: Protonix 40 mg IV x  1 dose, lactated Ringer's as high versus bolus, initiation of transfusion of 1 unit PBC.  Subsequently, the patient was admitted for further evaluation management of suspected acute versus subacute upper GI bleed resulting in acute versus subacute blood loss anemia, with presentation also notable for acute kidney injury, and transient right-sided facial droop with transient right upper extremity weakness.    Review of Systems: As per HPI otherwise 10 point review of systems negative.   Past Medical History:  Diagnosis Date   Acute encephalopathy 02/12/2018   Alzheimer's dementia (HCC)    CAD (coronary artery disease)    CAP (community acquired pneumonia) 02/11/2018   Carpal tunnel syndrome, bilateral 02/25/2020   Coronary artery disease    Diabetes mellitus    Diverticulitis    Hearing loss    Hypertension    Hypokalemia 02/12/2018   NSTEMI (non-ST elevated myocardial infarction) [facility])     Past Surgical History:  Procedure Laterality Date   ANGIOPLASTY     CORONARY STENT INTERVENTION N/A 02/02/2017   Procedure: Coronary Stent Intervention;  Surgeon: Rinaldo Cloud, MD;  Location: [facility] INVASIVE CV LAB;  Service: Cardiovascular;  Laterality: N/A;   CORONARY STENTS     LEFT HEART CATH AND CORONARY ANGIOGRAPHY N/A 02/02/2017   Procedure: Left Heart Cath and Coronary Angiography;  Surgeon: Rinaldo Cloud, MD;  Location: [facility] INVASIVE CV LAB;  Service: Cardiovascular;  Laterality: N/A;   LEFT HEART CATHETERIZATION WITH CORONARY ANGIOGRAM N/A 03/16/2012   Procedure: LEFT HEART CATHETERIZATION WITH CORONARY ANGIOGRAM;  Surgeon: Robynn Pane, MD;  Location: [facility] CATH LAB;  Service: Cardiovascular;  Laterality: N/A;   PERCUTANEOUS CORONARY STENT INTERVENTION (PCI-S)  03/16/2012   Procedure: PERCUTANEOUS CORONARY STENT INTERVENTION (PCI-S);  Surgeon: Robynn Pane, MD;  Location: [facility] CATH LAB;  Service: Cardiovascular;;   TONSILLECTOMY      Social History:  reports that he has never smoked. He  has never used smokeless tobacco. He reports that he does not drink alcohol and does not use drugs.   Allergies  Allergen Reactions   Lisinopril     Other reaction(s): Cough   Metformin Diarrhea    Family history reviewed and not pertinent    Prior to Admission medications   Medication Sig Start Date End Date Taking? Authorizing Provider  acetaminophen (TYLENOL) 500 MG tablet Take 500 mg by mouth every 6 (six) hours as needed for moderate pain.    [provider]  amiodarone (PACERONE) 200 MG tablet Take 1 tablet (200 mg total) by mouth daily. 07/05/23   Rinaldo Cloud, MD  amiodarone (PACERONE) 200 MG tablet Take 1 tablet (200 mg total) by mouth daily. 07/04/23   Rinaldo Cloud, MD  apixaban (ELIQUIS) 5 MG TABS tablet Take 1 tablet (5 mg total) by mouth 2 (two) times daily. 07/04/23   Rinaldo Cloud, MD  apixaban (ELIQUIS) 5 MG TABS tablet Take 1 tablet (5 mg total) by mouth 2 (two) times daily. 07/04/23   Rinaldo Cloud, MD  aspirin 81 MG tablet Take 81 mg by mouth daily.    [provider]  atorvastatin (LIPITOR) 80 MG tablet Take 1 tablet (80 mg total) by mouth every evening. 07/04/23   Rinaldo Cloud, MD  diclofenac sodium (VOLTAREN) 1 % GEL Apply 2 g topically 4 (four) times daily as needed (pain). 11/01/18  Melene Plan, DO  linagliptin (TRADJENTA) 5 MG TABS tablet Take 1 tablet (5 mg total) by mouth daily. 07/05/23   Rinaldo Cloud, MD  losartan (COZAAR) 100 MG tablet Take 100 mg by mouth daily.     [provider]  Memantine HCl 10 MG/5ML SOLN Take 5 mLs by mouth 2 (two) times daily at 10 AM and 5 PM. 06/22/23   Micki Riley, MD  Multiple Vitamin (MULTIVITAMIN) tablet Take 1 tablet by mouth daily.    [provider]  nitroGLYCERIN (NITROSTAT) 0.4 MG SL tablet Place 1 tablet (0.4 mg total) under the tongue every 5 (five) minutes x 3 doses as needed for chest pain. 03/18/12   Rinaldo Cloud, MD  psyllium (HYDROCIL/METAMUCIL) 95 % PACK Take 1  packet by mouth daily.    [provider]     Objective    Physical Exam: Vitals:   09/03/23 1836 09/03/23 1930 09/03/23 2130 09/04/23 0000  BP: (!) 121/90 (!) 163/71 (!) 145/81 (!) 119/55  Pulse: (!) 126 62 69 60  Resp: 18 15 12 15   Temp: 98.8 F (37.1 C)   98.1 F (36.7 C)  TempSrc: Oral   Oral  SpO2: 100% 97% 100% 100%    General: appears to be stated age; alert, confused Skin: warm, dry, no rash Head:  AT/[place] Mouth:  Oral mucosa membranes appear moist, normal dentition Neck: supple; trachea midline Heart:  RRR; did not appreciate any M/R/G Lungs: CTAB, did not appreciate any wheezes, rales, or rhonchi Abdomen: + BS; soft, ND, NT Vascular: 2+ pedal pulses b/l; 2+ radial pulses b/l Extremities: no peripheral edema, no muscle wasting Neuro: strength and sensation intact in upper and lower extremities b/l     Labs on Admission: I have personally reviewed following labs and imaging studies  CBC: Recent Labs  Lab 09/03/23 1848  WBC 5.5  NEUTROABS 2.7  HGB 8.1*  HCT 24.8*  MCV 96.1  PLT 211   Basic Metabolic Panel: Recent Labs  Lab 09/03/23 1848  NA 137  K 4.1  CL 103  CO2 24  GLUCOSE 160*  BUN 34*  CREATININE 1.72*  CALCIUM 8.7*   GFR: CrCl cannot be calculated (Unknown ideal weight.). Liver Function Tests: Recent Labs  Lab 09/03/23 1848  AST 19  ALT 16  ALKPHOS 47  BILITOT 0.7  PROT 6.3*  ALBUMIN 3.4*   No results for input(s): "LIPASE", "AMYLASE" in the last 168 hours. No results for input(s): "AMMONIA" in the last 168 hours. Coagulation Profile: Recent Labs  Lab 09/03/23 1848  INR 1.7*   Cardiac Enzymes: No results for input(s): "CKTOTAL", "CKMB", "CKMBINDEX", "TROPONINI" in the last 168 hours. BNP (last 3 results) No results for input(s): "PROBNP" in the last 8760 hours. HbA1C: No results for input(s): "HGBA1C" in the last 72 hours. CBG: No results for input(s): "GLUCAP" in the last 168 hours. Lipid Profile: No  results for input(s): "CHOL", "HDL", "LDLCALC", "TRIG", "CHOLHDL", "LDLDIRECT" in the last 72 hours. Thyroid Function Tests: No results for input(s): "TSH", "T4TOTAL", "FREET4", "T3FREE", "THYROIDAB" in the last 72 hours. Anemia Panel: No results for input(s): "VITAMINB12", "FOLATE", "FERRITIN", "TIBC", "IRON", "RETICCTPCT" in the last 72 hours. Urine analysis:    Component Value Date/Time   COLORURINE YELLOW 09/03/2023 2252   APPEARANCEUR CLEAR 09/03/2023 2252   LABSPEC 1.014 09/03/2023 2252   PHURINE 6.0 09/03/2023 2252   GLUCOSEU NEGATIVE 09/03/2023 2252   HGBUR MODERATE (A) 09/03/2023 2252   BILIRUBINUR NEGATIVE 09/03/2023 2252   BILIRUBINUR  Negative 01/03/2019 0953   KETONESUR NEGATIVE 09/03/2023 2252   PROTEINUR NEGATIVE 09/03/2023 2252   UROBILINOGEN 0.2 01/03/2019 0953   NITRITE NEGATIVE 09/03/2023 2252   LEUKOCYTESUR NEGATIVE 09/03/2023 2252    Radiological Exams on Admission: CT HEAD WO CONTRAST ( ) Result Date: 09/03/2023 CLINICAL DATA:  Right lower extremity weakness EXAM: CT HEAD WITHOUT CONTRAST TECHNIQUE: Contiguous axial images were obtained from the base of the skull through the vertex without intravenous contrast. RADIATION DOSE REDUCTION: This exam was performed according to the departmental dose-optimization program which includes automated exposure control, adjustment of the mA and/or kV according to patient size and/or use of iterative reconstruction technique. COMPARISON:  07/02/2023 FINDINGS: Brain: There is no mass, hemorrhage or extra-axial collection. There is generalized atrophy without lobar predilection. Hypodensity of the white matter is most commonly associated with chronic microvascular disease. Vascular: No hyperdense vessel or unexpected vascular calcification. Skull: The visualized skull base, calvarium and extracranial soft tissues are normal. Sinuses/Orbits: No fluid levels or advanced mucosal thickening of the visualized paranasal sinuses. No mastoid  or middle ear effusion. Normal orbits. Other: None. IMPRESSION: 1. No acute intracranial abnormality. 2. Generalized atrophy and findings of chronic microvascular disease. Electronically Signed   By: Deatra Robinson M.D.   On: 09/03/2023 20:08      Assessment/Plan   Principal Problem:   Acute upper GI bleed Active Problems:   DM2 (diabetes mellitus, type 2) (HCC)   Essential hypertension   Acute blood loss anemia   Facial droop   Weakness of right upper extremity   AKI (acute kidney injury) (HCC)   Paroxysmal atrial fibrillation (HCC)   Chronic combined systolic and diastolic heart failure (HCC)     #) Acute Upper GI Bleed: diagnosis on the basis of new finding of dark-colored stool with fecal occult blood positive finding, with elevated BUN relative demonstration prior.  Specific duration of this is unclear, as the as advanced mention, wife has not noted any recent dark coloration.  However, given interval decline in hemoglobin from 12.8 on 07/04/2023 to today's value of 8.1, with the patient appearing hemodynamically stable, without any evidence of tachycardia with normotensive blood pressures, suspected subacute timeframe, starting after 07/04/2023.  He is on Eliquis via chronic anticoagulation paroxysmal atrial fibrillation. No NSAID use. No known history of known underlying liver disease, and denies any history of alcohol abuse or recent alcohol consumption.   In the absence of known liver disease, initiation of SBP prophylaxis does not appear to be warranted. Presentation and history are less suggestive of variceal bleed, and therefore there does not appear to be an indication for octreotide. Given suspected upper GI source, will continue IV Protonix.    At this time, the patient appears hemodynamically stable, with normotensive blood pressures in the absence of any associated tachycardia. EDP consulted on-call [facility] gastroenterologist, with additional recs pending at this time. .      Plan: NPO. Refraining from pharmacologic DVT prophylaxis.  Hold home Eliquis for now.  Monitor on telemetry.  Maintain at least 2 large bore IV's.  Transfusion of 1 unit PRBC, as ordered by EDP.  Lasix 20 mg IV x 1 dose following completion of this 1 unit PRBC.  Q4H H&H's have been ordered through 1300 on 09/04/2023. Will closely monitor these ensuing Hgb levels and correlate these data points with the patient's overall clinical picture including vital signs to determine need for subsequent transfusion. On-call GI consulted, as above, with additional recs pending at this time. Further evaluation and  management of corresponding acute blood loss anemia, including adding-on iron studies to pre-transfusion specimen, as further detailed below. CMP in the AM.                   #) Acute blood loss anemia: in the setting of suspected acute upper GI bleed, presenting Hgb noted to be 8.1, relative to baseline hemoglobin range of 12-14 and relative demonstrates a prior hemoglobin data point of 12.8 on 07/04/2023, timeframe, due to associated hemodynamic stability and relatively asymptomatic nature of presentation but self-limited right-sided facial droop and right upper extremity weakness.  Currently receiving transfusion of 1 unit PBC as ordered by EDP.  Plan: work-up and management for presenting suspected acute upper GI bleed, as above, including close monitoring of Q4H H&H's, with clinical evaluation for determination of need for blood transfusion, as further described above. Monitor on telemetry.  Refraining from pharmacologic DVT prophylaxis.  Hold home Eliquis for now.  Add on the following to initial lab specimen collected in the ED today: total iron, TIBC, ferritin,  Gastroenterology consulted, as above.  Lasix 20 mg IV x 1 to be administered following completion of transfusion 1 unit PRBC in the setting of the patient's history of chronic systolic/diastolic heart failure.  Monitor strict I's and  O's and daily weights.                 #) Acute right-sided facial droop/right upper extremity weakness: Acute onset of the symptoms around 1800 on 07/02/2024, with spontaneous complete resolution within the first 30 minutes of onset, without evidence of any recurrence or additional acute focal neurologic deficits noted.  CT head showed no evidence of acute intracranial process.  As discussed patient's case with on-call neurology, as above.  Potential acute focal neurologic deficits on the basis of type II supply/demand mismatch in the setting of intravascular depletion stemming from patient's acute blood loss anemia in the setting of acute upper gastrointestinal bleed.  Brain pending.  Alcohol neurology conveys their recommendation that if, MRI brain is negative, would not pursue additional TIA or stroke workup, including additional workup ischemic CVA risk factors.   Plan: Follow-up result of MRI brain, as above.                    #) Acute Kidney Injury: Presenting creatinine 1.72 compared to most recent prior value of 1.31 on 07/04/2023.  Suspect that this is prerenal in nature in the setting of intravascular depletion and resultant decline in renal perfusion as a consequence of interval decline in oxygen delivery capacity resulting from acute blood loss anemia acute upper gastrointestinal bleed.  Potential pharmacologic exacerbation in the form of outpatient losartan.  Patient has received 500 cc LR bolus in the ED, and is currently receiving transfusion 1 unit PRBC.  Will refrain from additional IV fluids at this time, rather closely monitor ensuing renal function trend in response to the previously administered IV fluids and 1 unit PRBC, will also closely monitoring for any evidence of evidence of development of acute volume overload.  Today's urinalysis with microscopy showed no white blood cells, but was positive hyaline cast, consistent with an overall picture of  intravascular depletion.   Plan: monitor strict I's & O's and daily weights. Attempt to avoid nephrotoxic agents.  Hold home losartan for now.  Refrain from NSAIDs. Repeat CMP in the morning. Check serum magnesium level.  Further evaluation management of presenting acute blood loss anemia, as above.                      #)  Chronic systolic/diastolic heart failure: documented history of such, with most recent echocardiogram performed in December 2024, which was notable for LVEF 40 to 45%, grade 1 diastolic dysfunction, normal right ventricular systolic function.. No clinical evidence to suggest acutely decompensated heart failure at this time, but will closely monitor for ensuing development of evidence of such given the patient is receiving PRBC transfusion in the context of acute blood loss anemia. home diuretic regimen reportedly consists of the following: None. Home cardiac medications also include the following: Losartan, which will be held for now in the setting of concomitant presenting acute kidney injury.   Plan: monitor strict I's & O's and daily weights. Repeat BMP in AM. Check serum mag level.  Hold home losartan for now.  Lasix 20 mg IV x 1 dose to be administered following completion of transfusion of 1 unit PRBC.  Add on BNP.                  #) Paroxysmal atrial fibrillation: Documented history of such. In setting of CHA2DS2-VASc score of 5, there is an indication for chronic anticoagulation for thromboembolic prophylaxis. Consistent with this, patient is chronically anticoagulated on Eliquis.  However, Eliquis will be held for now in the setting of presenting acute upper gastrointestinal bleed, as well.  Home AV nodal blocking regimen: None.  Rather, a rhythm control strategy is pursued as an outpatient, with the patient on daily oral amiodarone at home.  Most recent echocardiogram occurred in December 2024 was notable for mildly dilated left atrium as  well as mild regurgitation and mild aortic regurgitation, with additional results as conveyed above. Presenting EKG sinus rhythm without overt evidence of acute ischemic changes.   Plan: monitor strict I's & O's and daily weights. CMP/CBC in AM. Check serum mag level.  In the setting of acute upper gastrointestinal bleed by acute blood loss anemia, will hold home Eliquis for now.  In the setting of current n.p.o. status, will hold home amiodarone for now.  Monitor on symmetry.                #) Type 2 Diabetes Mellitus: documented history of such.  Eula Fried on occasions as an outpatient, including outpatient use of insulin or oral hypoglycemic agents.  Most recent hemoglobin A1c level was 7.2% when checked in July 2023.  Presenting blood sugar 160.  Plan: accuchecks every 6 hours with very low-dose sliding scale insulin in the setting of n.p.o. status.  Add on hemoglobin A1c level.                   #) Essential Hypertension: documented h/o such, with outpatient antihypertensive regimen including losartan, Norvasc.  SBP's in the ED today: 120s to 140s mmHg. however, in the setting of presenting acute upper gastrointestinal bleed resulting in acute blood loss anemia and resultant current n.p.o. status, will hold home antihypertensive medications for now.  Plan: Close monitoring of subsequent BP via routine VS. hold home losartan and amlodipine for now, as well.      DVT prophylaxis: SCD's   Code Status: DNR, consistent with code status documentation from most recent previous hospitalization Family Communication: none Disposition Plan: Per Rounding Team Consults called: EDP has contacted on-call [facility], as further detailed above; additionally, EDP has discussed patient's case with on-call neurology, Dr. Amada Jupiter, as further detailed above;  Admission status: Observation     I SPENT GREATER THAN 75  MINUTES IN CLINICAL CARE TIME/MEDICAL DECISION-MAKING IN  COMPLETING THIS ADMISSION.  Chaney Born Farron Watrous DO [facility]  From 7PM - 7AM   09/04/2023, 1:24 AM

## 2023-09-04 NOTE — Hospital Course (Addendum)
 The patient is an 89 year old significant for but not limited to advanced dementia, paroxysmal atrial fibrillation on anticoagulation with apixaban, diabetes mellitus type 2, essential hypertension, chronic systolic and diastolic CHF as well as other comorbidities who presented to [facility] on 09/03/2023 with a right facial droop, slurred speech, and inability to ambulate.  Further workup was done and he has now a acute CVA so neurology's been consulted.  Further workup was also done and showed that his hemoglobin started dropping since the last few months and it was noted to be 12.8 on 07/04/2023 and is dropped to 8.1 on admission so FOBT was obtained this was positive.  GIs been consulted and pending on goals of care may be considering doing EGD.  Currently his anticoagulation has been held and neurology is to see the patient but in the interim we will workup the patient with the basic stroke workup.  Assessment and Plan:  Acute Right Pontine CVA -Symptoms included right facial droop, slurred speech and inability to ambulate and presented and had a head CT which showed no acute intracranial abnormality and generalized atrophy with chronic microvascular disease findings -MRI of the brain done was significantly motion degraded but did show a 16 mm acute infarct in the right aspect of the pons along with mild to moderate chronic small vessel disease changes within the cerebral white matter and a chronic left thalamic lacunar infarct.  As well as generalized cerebral and cerebellar atrophy -Neurology to be consulted for further evaluation and recommendations -Check ECHO, PT/OT/SLP, Lipid Panel, HbA1c, and CTA Head and Neck, Neurochecks per Protocol and Telemetry Monitoring   Suspected GI Bleeding and Heme Positive Stools -Holding his anticoagulation and aspirin as he is +FOBT -Has had a 4 g drop in the last few months -He is typed and screened and transfused 1 unit PRBCs and GI has been consulted  recommend keeping hemoglobin above 8 given his advanced age, comorbidities and acute CVA -Anemia panel done and showed an iron level 41, UIBC 263, TIBC of 304, saturation ratios of 14%, ferritin level of 9 -Hgb/Hct Trend: Recent Labs  Lab 09/03/23 1848 09/04/23 1133 09/04/23 1646  HGB 8.1* 7.9* 9.9*  HCT 24.8* 23.8* 28.1*  MCV 96.1 94.8  --   -Continue to Monitor for S/Sx of Bleeding; Repeat CBC in the AM   Chronic Systolic and Diastolic CHF -Given the diuresis after blood transfusion -Strict I's and O's and Daily Weights;  Intake/Output Summary (Last 24 hours) at 09/04/2023 1946 Last data filed at 09/04/2023 1721 Gross per 24 hour  Intake 919.84 ml  Output 2700 ml  Net -1780.16 ml  -Monitor for signs and symptoms of volume overload  Paroxysmal Atrial Fibrillation -Hold Anticoagulation given concern for GI Bleed; CHA2DS2-VASc of 5 -Continue to Monitor on the Progressive Unit Telemetry  -Resume home Amiodarone once cleared by SLP  Essential Hypertension -Holding his antihypertensives for now until he is able to swallow -Given his acute CVA will allow for permissive hypertension as well -Continue To monitor blood pressures per protocol  AKI on CKD Stage 3b -BUN/Cr Trend improving: Recent Labs  Lab 09/03/23 1848 09/04/23 1133  BUN 34* 23  CREATININE 1.72* 1.27*  -Avoid Nephrotoxic Medications, Contrast Dyes, Hypotension and Dehydration to Ensure Adequate Renal Perfusion and will need to Renally Adjust Meds -Continue to Monitor and Trend Renal Function carefully and repeat CMP in the AM   Diabetes Mellitus Type 2 -Repeat hemoglobin A1c is 5.9 -Monitor CBGs per protocol with very sensitive NovoLog/scale  insulin AC  Hypokalemia -Patient's K+ Level Trend: Recent Labs  Lab 09/03/23 1848 09/04/23 1133  K 4.1 3.4*  -Replete with IV Kcl 40 mEQ -Continue to Monitor and Replete as Necessary -Repeat CMP in the AM   Hypoalbuminemia -Patient's Albumin Trend: Recent Labs   Lab 09/03/23 1848 09/04/23 1133  ALBUMIN 3.4* 2.8*  -Continue to Monitor and Trend and repeat CMP in the AM  Overweight -Complicates overall prognosis and care -Estimated body mass index is 25.43 kg/m as calculated from the following:   Height as of this encounter: 5\' 4"  (1.626 m).   Weight as of this encounter: 67.2 kg.  -Weight Loss and Dietary Counseling given

## 2023-09-05 ENCOUNTER — Inpatient Hospital Stay (HOSPITAL_COMMUNITY): Payer: No Typology Code available for payment source

## 2023-09-05 DIAGNOSIS — I6389 Other cerebral infarction: Secondary | ICD-10-CM

## 2023-09-05 DIAGNOSIS — D500 Iron deficiency anemia secondary to blood loss (chronic): Secondary | ICD-10-CM

## 2023-09-05 DIAGNOSIS — I639 Cerebral infarction, unspecified: Secondary | ICD-10-CM | POA: Diagnosis not present

## 2023-09-05 DIAGNOSIS — N179 Acute kidney failure, unspecified: Secondary | ICD-10-CM | POA: Diagnosis not present

## 2023-09-05 DIAGNOSIS — R195 Other fecal abnormalities: Secondary | ICD-10-CM | POA: Diagnosis not present

## 2023-09-05 DIAGNOSIS — D62 Acute posthemorrhagic anemia: Secondary | ICD-10-CM | POA: Diagnosis not present

## 2023-09-05 DIAGNOSIS — K922 Gastrointestinal hemorrhage, unspecified: Secondary | ICD-10-CM | POA: Diagnosis not present

## 2023-09-05 DIAGNOSIS — E119 Type 2 diabetes mellitus without complications: Secondary | ICD-10-CM | POA: Diagnosis not present

## 2023-09-05 LAB — TYPE AND SCREEN
ABO/RH(D): O POS
Antibody Screen: NEGATIVE
Unit division: 0

## 2023-09-05 LAB — GLUCOSE, CAPILLARY
Glucose-Capillary: 104 mg/dL — ABNORMAL HIGH (ref 70–99)
Glucose-Capillary: 146 mg/dL — ABNORMAL HIGH (ref 70–99)
Glucose-Capillary: 159 mg/dL — ABNORMAL HIGH (ref 70–99)
Glucose-Capillary: 160 mg/dL — ABNORMAL HIGH (ref 70–99)

## 2023-09-05 LAB — CBC WITH DIFFERENTIAL/PLATELET
Abs Immature Granulocytes: 0.01 10*3/uL (ref 0.00–0.07)
Basophils Absolute: 0.1 10*3/uL (ref 0.0–0.1)
Basophils Relative: 1 %
Eosinophils Absolute: 0.2 10*3/uL (ref 0.0–0.5)
Eosinophils Relative: 3 %
HCT: 26.4 % — ABNORMAL LOW (ref 39.0–52.0)
Hemoglobin: 9.5 g/dL — ABNORMAL LOW (ref 13.0–17.0)
Immature Granulocytes: 0 %
Lymphocytes Relative: 31 %
Lymphs Abs: 1.9 10*3/uL (ref 0.7–4.0)
MCH: 32.4 pg (ref 26.0–34.0)
MCHC: 36 g/dL (ref 30.0–36.0)
MCV: 90.1 fL (ref 80.0–100.0)
Monocytes Absolute: 0.4 10*3/uL (ref 0.1–1.0)
Monocytes Relative: 7 %
Neutro Abs: 3.5 10*3/uL (ref 1.7–7.7)
Neutrophils Relative %: 58 %
Platelets: 194 10*3/uL (ref 150–400)
RBC: 2.93 MIL/uL — ABNORMAL LOW (ref 4.22–5.81)
RDW: 13.4 % (ref 11.5–15.5)
WBC: 6 10*3/uL (ref 4.0–10.5)
nRBC: 0 % (ref 0.0–0.2)

## 2023-09-05 LAB — BASIC METABOLIC PANEL
Anion gap: 6 (ref 5–15)
BUN: 18 mg/dL (ref 8–23)
CO2: 22 mmol/L (ref 22–32)
Calcium: 8 mg/dL — ABNORMAL LOW (ref 8.9–10.3)
Chloride: 108 mmol/L (ref 98–111)
Creatinine, Ser: 1.09 mg/dL (ref 0.61–1.24)
GFR, Estimated: >60 mL/min (ref >60)
Glucose, Bld: 109 mg/dL — ABNORMAL HIGH (ref 70–99)
Potassium: 4 mmol/L (ref 3.5–5.1)
Sodium: 136 mmol/L (ref 135–145)

## 2023-09-05 LAB — ECHOCARDIOGRAM COMPLETE
AR max vel: 1.63 cm2
AV Area VTI: 1.5 cm2
AV Area mean vel: 1.45 cm2
AV Mean grad: 5 mm[Hg]
AV Peak grad: 10 mm[Hg]
Ao pk vel: 1.58 m/s
Area-P 1/2: 2.2 cm2
Calc EF: 48.2 %
Height: 64 in
S' Lateral: 2.6 cm
Single Plane A2C EF: 48.1 %
Single Plane A4C EF: 44.6 %
Weight: 2430.35 [oz_av]

## 2023-09-05 LAB — BPAM RBC
Blood Product Expiration Date: 202503132359
ISSUE DATE / TIME: 202502240400
Unit Type and Rh: 5100

## 2023-09-05 MED ORDER — ATORVASTATIN CALCIUM 80 MG PO TABS
80.0000 mg | ORAL_TABLET | Freq: Every day | ORAL | Status: DC
  Administered 2023-09-06: 80 mg via ORAL
  Filled 2023-09-05: qty 1

## 2023-09-05 MED ORDER — HALOPERIDOL LACTATE 5 MG/ML IJ SOLN
INTRAMUSCULAR | Status: AC
Start: 2023-09-05 — End: 2023-09-06
  Filled 2023-09-05: qty 1

## 2023-09-05 MED ORDER — HALOPERIDOL LACTATE 5 MG/ML IJ SOLN
5.0000 mg | Freq: Once | INTRAMUSCULAR | Status: AC
  Administered 2023-09-05: 5 mg via INTRAVENOUS

## 2023-09-05 NOTE — Progress Notes (Signed)
 OT Cancellation Note  Patient Details Name: Alexander Donaldson MRN: [id number] DOB: 05/19/1934   Cancelled Treatment:    Reason Eval/Treat Not Completed: Patient at procedure or test/ unavailable (echo at bedside)  Mateo Flow 09/05/2023, 1:24 PM

## 2023-09-05 NOTE — Progress Notes (Addendum)
 Patient ID: YUTA CIPOLLONE, male   DOB: 01/18/1934, 89 y.o.   MRN: [id number]    Progress Note   Subjective   Day # 2 CC; weakness, anemia, heme positive stool  Patient diagnosed with acute right pontine infarct yesterday He has had improvement since yesterday, he passed a swallowing eval and wife reports that he is able to eat without difficulty.  He was able to get up and ambulate with physical therapy today.  Speech is still a bit dysarthric.  Patient has no complaints of abdominal pain or discomfort no nausea No documented stool  Labs today-WBC 6.0/hemoglobin 9.5/hematocrit 26.4 post transfusions yesterday BUN 18/creatinine 1.09/potassium 4   Objective   Vital signs in last 24 hours: Temp:  [97.7 F (36.5 C)-98.6 F (37 C)] 97.7 F (36.5 C) (02/25 1100) Pulse Rate:  [58-67] 65 (02/25 1100) Resp:  [16-18] 17 (02/25 1100) BP: (140-179)/(66-91) 159/72 (02/25 1100) SpO2:  [97 %-100 %] 100 % (02/25 1100) Weight:  [67.2 kg-68.9 kg] 68.9 kg (02/25 0622) Last BM Date : 09/04/23 General :Very elderly African-American male in NAD watching TV, speech dysarthric, tries to answer appropriately and wife at bedside Heart:  Regular rate and rhythm; no murmurs Lungs: Respirations even and unlabored, lungs CTA bilaterally Abdomen:  Soft, nontender and nondistended. Normal bowel sounds. Extremities:  Without edema. Neurologic:  Alert and orientedx2, speech dysarthric Psych:  Cooperative. Normal mood and affect.  Intake/Output from previous day: 02/24 0701 - 02/25 0700 In: 535.4 [P.O.:150; IV Piggyback:385.4] Out: 3600 [Urine:3600] Intake/Output this shift: No intake/output data recorded.  Lab Results: Recent Labs    09/03/23 1848 09/04/23 1133 09/04/23 1646 09/05/23 0549  WBC 5.5 4.1  --  6.0  HGB 8.1* 7.9* 9.9* 9.5*  HCT 24.8* 23.8* 28.1* 26.4*  PLT 211 170  --  194   BMET Recent Labs    09/03/23 1848 09/04/23 1133 09/05/23 0549  NA 137 138 136  K 4.1 3.4* 4.0  CL 103  107 108  CO2 24 25 22   GLUCOSE 160* 124* 109*  BUN 34* 23 18  CREATININE 1.72* 1.27* 1.09  CALCIUM 8.7* 8.2* 8.0*   LFT Recent Labs    09/04/23 1133  PROT 5.3*  ALBUMIN 2.8*  AST 16  ALT 14  ALKPHOS 42  BILITOT 1.1   PT/INR Recent Labs    09/03/23 1848  LABPROT 19.9*  INR 1.7*    Studies/Results: ECHOCARDIOGRAM COMPLETE Result Date: 09/05/2023    ECHOCARDIOGRAM REPORT   Patient Name:   LOTTIE SISKA Date of Exam: 09/05/2023 Medical Rec #:  [id number]       Height:       64.0 in Accession #:    [id number]      Weight:       151.9 lb Date of Birth:  10-25-1933        BSA:          1.740 m Patient Age:    89 years        BP:           175/66 mmHg Patient Gender: M               HR:           62 bpm. Exam Location:  Inpatient Procedure: 2D Echo, Cardiac Doppler and Color Doppler (Both Spectral and Color            Flow Doppler were utilized during procedure). Indications:    Stroke I63.9  History:  Patient has prior history of Echocardiogram examinations, most                 recent 07/02/2023. Previous Myocardial Infarction and CAD, TIA,                 Arrythmias:Atrial Fibrillation; Risk Factors:Diabetes.  Sonographer:    Lucendia Herrlich RCS Referring Phys: [id number] Kateri Mc LATIF [facility] IMPRESSIONS  1. Left ventricular ejection fraction, by estimation, is 60 to 65%. The left ventricle has normal function. The left ventricle has no regional wall motion abnormalities. There is mild concentric left ventricular hypertrophy of the basal-septal segment. Left ventricular diastolic parameters are consistent with Grade I diastolic dysfunction (impaired relaxation).  2. Right ventricular systolic function is normal. The right ventricular size is normal.  3. Left atrial size was mildly dilated.  4. The mitral valve is normal in structure. Trivial mitral valve regurgitation. No evidence of mitral stenosis.  5. The aortic valve is moderately calcified. The right and left coronary cusps are fused.  The aortic valve is tricuspid. There is mild calcification of the aortic valve. Aortic valve regurgitation is trivial. Mild aortic valve stenosis. FINDINGS  Left Ventricle: Left ventricular ejection fraction, by estimation, is 60 to 65%. The left ventricle has normal function. The left ventricle has no regional wall motion abnormalities. Strain imaging was not performed. The left ventricular internal cavity  size was normal in size. There is mild concentric left ventricular hypertrophy of the basal-septal segment. Left ventricular diastolic parameters are consistent with Grade I diastolic dysfunction (impaired relaxation). Right Ventricle: The right ventricular size is normal. No increase in right ventricular wall thickness. Right ventricular systolic function is normal. Left Atrium: Left atrial size was mildly dilated. Right Atrium: Right atrial size was normal in size. Pericardium: There is no evidence of pericardial effusion. Mitral Valve: The mitral valve is normal in structure. Mild mitral annular calcification. Trivial mitral valve regurgitation. No evidence of mitral valve stenosis. Tricuspid Valve: The tricuspid valve is normal in structure. Tricuspid valve regurgitation is trivial. No evidence of tricuspid stenosis. Aortic Valve: The aortic valve is moderately calcified. The right and left coronary cusps are fused. The aortic valve is tricuspid. There is mild calcification of the aortic valve. Aortic valve regurgitation is trivial. Mild aortic stenosis is present. Aortic valve mean gradient measures 5.0 mmHg. Aortic valve peak gradient measures 10.0 mmHg. Aortic valve area, by VTI measures 1.50 cm. Pulmonic Valve: The pulmonic valve was normal in structure. Pulmonic valve regurgitation is trivial. No evidence of pulmonic stenosis. Aorta: The aortic root is normal in size and structure. Venous: The inferior vena cava was not well visualized. IAS/Shunts: No atrial level shunt detected by color flow Doppler.  Additional Comments: 3D imaging was not performed.  LEFT VENTRICLE PLAX 2D LVIDd:         3.30 cm     Diastology LVIDs:         2.60 cm     LV e' medial:    4.57 cm/s LV PW:         0.90 cm     LV E/e' medial:  10.2 LV IVS:        1.20 cm     LV e' lateral:   5.98 cm/s LVOT diam:     2.10 cm     LV E/e' lateral: 7.8 LV SV:         50 LV SV Index:   29 LVOT Area:     3.46 cm  LV Volumes (MOD) LV vol d, MOD A2C: 80.8 ml LV vol d, MOD A4C: 64.8 ml LV vol s, MOD A2C: 41.9 ml LV vol s, MOD A4C: 35.9 ml LV SV MOD A2C:     38.9 ml LV SV MOD A4C:     64.8 ml LV SV MOD BP:      36.1 ml RIGHT VENTRICLE RV S prime:     14.30 cm/s TAPSE (M-mode): 1.0 cm LEFT ATRIUM             Index        RIGHT ATRIUM          Index LA diam:        3.90 cm 2.24 cm/m   RA Area:     8.47 cm LA Vol (A2C):   51.5 ml 29.59 ml/m  RA Volume:   12.70 ml 7.30 ml/m LA Vol (A4C):   48.4 ml 27.81 ml/m LA Biplane Vol: 49.2 ml 28.27 ml/m  AORTIC VALVE AV Area (Vmax):    1.63 cm AV Area (Vmean):   1.45 cm AV Area (VTI):     1.50 cm AV Vmax:           158.00 cm/s AV Vmean:          108.000 cm/s AV VTI:            0.333 m AV Peak Grad:      10.0 mmHg AV Mean Grad:      5.0 mmHg LVOT Vmax:         74.15 cm/s LVOT Vmean:        45.200 cm/s LVOT VTI:          0.144 m LVOT/AV VTI ratio: 0.43  AORTA Ao Root diam: 3.60 cm Ao Asc diam:  3.50 cm MITRAL VALVE               TRICUSPID VALVE MV Area (PHT): 2.20 cm    TR Peak grad:   18.5 mmHg MV Decel Time: 345 msec    TR Vmax:        215.00 cm/s MV E velocity: 46.60 cm/s MV A velocity: 95.40 cm/s  SHUNTS MV E/A ratio:  0.49        Systemic VTI:  0.14 m                            Systemic Diam: 2.10 cm Arvilla Meres MD Electronically signed by Arvilla Meres MD Signature Date/Time: 09/05/2023/2:08:26 PM    Final    MR Brain Wo Contrast (neuro protocol) Result Date: 09/04/2023 CLINICAL DATA:  Provided history: Transient ischemic attack (TIA). Additional history provided: Facial droop, slurred speech,  weakness, confusion, agitation. EXAM: MRI HEAD WITHOUT CONTRAST TECHNIQUE: Multiplanar, multiecho pulse sequences of the brain and surrounding structures were obtained without intravenous contrast. COMPARISON:  Head CT 09/03/2023.  Brain MRI 03/31/2020. FINDINGS: Significantly motion degraded examination, limiting evaluation. Most notably, the axial diffusion-weighted sequence is severely motion degraded, the axial SWI sequence is severely motion degraded and the axial FLAIR sequence is severely motion degraded. Within these limitations, findings are as follows. Brain: Moderate-to-advanced generalized cerebral atrophy. Commensurate prominence of the ventricles sulci. Mild cerebellar atrophy. 16 mm acute infarct within the right aspect of the pons (for instance as seen on series 3, image 16) (series 4, image 17). Multifocal T2 FLAIR hyperintense signal abnormality within the cerebral white matter, nonspecific but compatible with mild-to-moderate chronic small vessel ischemic  disease. Chronic lacunar infarct within the left thalamus. No evidence of an intracranial mass. No extra-axial fluid collection. No midline shift. Vascular: Maintained flow voids within the proximal large arterial vessels. Skull and upper cervical spine: No focal worrisome marrow lesion. Ligamentous hypertrophy about the dens. Incompletely assessed cervical spondylosis. Sinuses/Orbits: No mass or acute finding within the imaged orbits. Mild mucosal thickening within a posterior right ethmoid air cell. IMPRESSION: 1. Significantly motion degraded examination. Within this limitation, findings are as follows. 2. 16 mm acute infarct within the right aspect of the pons. 3. Mild-to-moderate chronic small vessel ischemic changes within the cerebral white matter. 4. Chronic left thalamic lacunar infarct. 5. Moderate-to-advanced generalized cerebral atrophy. 6. Mild cerebellar atrophy. 7. Minor right ethmoid sinus disease. Electronically Signed   By: Jackey Loge D.O.   On: 09/04/2023 10:21   CT HEAD WO CONTRAST ( ) Result Date: 09/03/2023 CLINICAL DATA:  Right lower extremity weakness EXAM: CT HEAD WITHOUT CONTRAST TECHNIQUE: Contiguous axial images were obtained from the base of the skull through the vertex without intravenous contrast. RADIATION DOSE REDUCTION: This exam was performed according to the departmental dose-optimization program which includes automated exposure control, adjustment of the mA and/or kV according to patient size and/or use of iterative reconstruction technique. COMPARISON:  07/02/2023 FINDINGS: Brain: There is no mass, hemorrhage or extra-axial collection. There is generalized atrophy without lobar predilection. Hypodensity of the white matter is most commonly associated with chronic microvascular disease. Vascular: No hyperdense vessel or unexpected vascular calcification. Skull: The visualized skull base, calvarium and extracranial soft tissues are normal. Sinuses/Orbits: No fluid levels or advanced mucosal thickening of the visualized paranasal sinuses. No mastoid or middle ear effusion. Normal orbits. Other: None. IMPRESSION: 1. No acute intracranial abnormality. 2. Generalized atrophy and findings of chronic microvascular disease. Electronically Signed   By: Deatra Robinson M.D.   On: 09/03/2023 20:08       Assessment / Plan:    #58  89 year old male brought to the emergency room after noted by his wife to have slurred speech and right facial droop yesterday in addition to inability to ambulate.  This occurred in setting of chronic Eliquis and aspirin  MRI revealed acute CVA involving right pons, as well as generalized atrophy and mild to moderate chronic small vessel changes  Neuro has evaluated and favors continuing anticoagulation if possible in setting of his recent GI bleeding  #2 diabetes mellitus #3 advanced Alzheimer's disease #4 coronary artery disease status post prior MI and multiple stents #5.  Atrial  fibrillation #6.  Congestive heart failure  #7 normocytic anemia, heme positive stool with drop in hemoglobin of about 4 g over the past 2-1/2 months Slow GI blood loss, etiology not clear, suspect upper with dark stools.  Rule out aspirin induced peptic ulcer disease, chronic gastropathy, AVMs, occult neoplasm  Plan; continue to trend hemoglobin Continue twice daily PPI Discussed potential EGD with patient's wife today, they are having a family meeting/palliative care meeting this evening and asked them to discuss whether or not they would like to proceed with EGD.  We would not plan to put him through colonoscopy.  Will keep him n.p.o. in a.m. in event family decides to proceed that we could get him scheduled for the procedure tomorrow.   Principal Problem:   Acute upper GI bleed Active Problems:   DM2 (diabetes mellitus, type 2) (HCC)   Essential hypertension   TIA (transient ischemic attack)   Acute blood loss anemia   Facial droop  Weakness of right upper extremity   AKI (acute kidney injury) (HCC)   Paroxysmal atrial fibrillation (HCC)   Chronic combined systolic and diastolic heart failure (HCC)     LOS: 1 day   Amy Esterwood PA-C 09/05/2023, 3:01 PM  I have taken an interval history, thoroughly reviewed the chart and examined the patient. I agree with the Advanced Practitioner's note, impression and recommendations, and have recorded additional findings, impressions and recommendations below. I performed a substantive portion of this encounter (>50% time spent), including a complete performance of the medical decision making.  My additional thoughts are as follows:  Neurologic condition is stabilized, he had neurology consultation and was seen by SLP as well.  Neurologist seems to feel that his acute pontine CVA was more likely small vessel ischemic than embolic, but would still prefer he be on long-term anticoagulation if possible.  Upper endoscopy has been offered,  understanding that it is certainly high risk procedure given his age and medical comorbidities, most notably acute CVA and underlying A-fib with non-STEMI in December 2024.  His family is still considering it and will decide by tomorrow morning if they wish to pursue that.  If so, then we will likely be able to do it tomorrow.  I would not recommend a colonoscopy because the prep challenges and the procedural risks are higher. His wife and daughter are in agreement with that.   Charlie Pitter III Office:[phone]

## 2023-09-05 NOTE — Progress Notes (Signed)
 Patient is agitated, trying to get out of bed and kicking wife, Dr. Joneen Roach updated, Haldol given as ordered.

## 2023-09-05 NOTE — Consult Note (Signed)
 NEUROLOGY CONSULT NOTE   Date of service: September 05, 2023 Patient Name: Alexander Donaldson MRN:  [id number] DOB:  Jun 04, 1934 Chief Complaint: "Balance issues" Requesting Provider: Merlene Laughter, DO  History of Present Illness  Alexander Donaldson is a 89 y.o. male with hx of atrial fibrillation, diabetes, hypertension, Alzheimer's who presents with increasing difficulty walking and some right arm dysfunction.  He was found in the emergency department to have a hemoglobin that had dropped from 12->8, and in addition an MRI was performed yesterday which revealed a pontine stroke.  With his GI bleeding, gastroenterology has been involved and are considering doing an EGD.  He had been on aspirin and Eliquis prior to his event and both of these are being held.  At baseline, he needs help with all of his activities of daily living.  He is unable to go to the bathroom by himself.  If food is set in front of him he can still use utensils, but is unable to fix himself a plate and his wife asked to attend him to get him to eat at every meal.  He is able to walk prior to this event.  LKW: Prior to bed 2/22 Modified rankin score: 4-Needs assistance to walk and tend to bodily needs IV Thrombolysis: Outside of window EVT: Outside of window  NIHSS components Score: Comment  1a Level of Conscious 0[x]  1[]  2[]  3[]      1b LOC Questions 0[]  1[]  2[x]       1c LOC Commands 0[]  1[x]  2[]       2 Best Gaze 0[x]  1[]  2[]       3 Visual 0[x]  1[]  2[]  3[]      4 Facial Palsy 0[x]  1[]  2[]  3[]      5a Motor Arm - left 0[x]  1[]  2[]  3[]  4[]  UN[]    5b Motor Arm - Right 0[]  1[x]  2[]  3[]  4[]  UN[]    6a Motor Leg - Left 0[x]  1[]  2[]  3[]  4[]  UN[]    6b Motor Leg - Right 0[x]  1[]  2[]  3[]  4[]  UN[]    7 Limb Ataxia 0[x]  1[]  2[]  3[]  UN[]     8 Sensory 0[x]  1[]  2[]  UN[]      9 Best Language 0[]  1[]  2[x]  3[]      10 Dysarthria 0[]  1[x]  2[]  UN[]      11 Extinct. and Inattention 0[x]  1[]  2[]       TOTAL:        Past History   Past  Medical History:  Diagnosis Date   Acute encephalopathy 02/12/2018   Alzheimer's dementia (HCC)    CAD (coronary artery disease)    CAP (community acquired pneumonia) 02/11/2018   Carpal tunnel syndrome, bilateral 02/25/2020   Coronary artery disease    Diabetes mellitus    Diverticulitis    Hearing loss    Hypertension    Hypokalemia 02/12/2018   NSTEMI (non-ST elevated myocardial infarction) [facility])     Past Surgical History:  Procedure Laterality Date   ANGIOPLASTY     CORONARY STENT INTERVENTION N/A 02/02/2017   Procedure: Coronary Stent Intervention;  Surgeon: Rinaldo Cloud, MD;  Location: [facility] INVASIVE CV LAB;  Service: Cardiovascular;  Laterality: N/A;   CORONARY STENTS     LEFT HEART CATH AND CORONARY ANGIOGRAPHY N/A 02/02/2017   Procedure: Left Heart Cath and Coronary Angiography;  Surgeon: Rinaldo Cloud, MD;  Location: [facility] INVASIVE CV LAB;  Service: Cardiovascular;  Laterality: N/A;   LEFT HEART CATHETERIZATION WITH CORONARY ANGIOGRAM N/A 03/16/2012   Procedure: LEFT HEART CATHETERIZATION WITH CORONARY ANGIOGRAM;  Surgeon: Robynn Pane, MD;  Location: [facility] CATH LAB;  Service: Cardiovascular;  Laterality: N/A;   PERCUTANEOUS CORONARY STENT INTERVENTION (PCI-S)  03/16/2012   Procedure: PERCUTANEOUS CORONARY STENT INTERVENTION (PCI-S);  Surgeon: Robynn Pane, MD;  Location: [facility] CATH LAB;  Service: Cardiovascular;;   TONSILLECTOMY      Family History: History reviewed. No pertinent family history.  Social History  reports that he has never smoked. He has never used smokeless tobacco. He reports that he does not drink alcohol and does not use drugs.  Allergies  Allergen Reactions   Lisinopril     Other reaction(s): Cough   Metformin Diarrhea    Medications   Current Facility-Administered Medications:    acetaminophen (TYLENOL) tablet 650 mg, 650 mg, Oral, Q6H PRN **OR** acetaminophen (TYLENOL) suppository 650 mg, 650 mg, Rectal, Q6H PRN, Howerter, Justin B, DO    amiodarone (PACERONE) tablet 200 mg, 200 mg, Oral, Daily, Sheikh, Omair Latif, DO, 200 mg at 09/05/23 1610   atorvastatin (LIPITOR) tablet 40 mg, 40 mg, Oral, Daily, Marguerita Merles Latif, DO, 40 mg at 09/05/23 9604   hydrALAZINE (APRESOLINE) injection 10 mg, 10 mg, Intravenous, Q6H PRN, Marland Mcalpine, Omair Latif, DO   insulin aspart (novoLOG) injection 0-6 Units, 0-6 Units, Subcutaneous, Q6H, Howerter, Justin B, DO, 1 Units at 09/05/23 0014   memantine (NAMENDA) tablet 10 mg, 10 mg, Oral, BID, Sheikh, Omair Latif, DO, 10 mg at 09/05/23 5409   multivitamin with minerals tablet 1 tablet, 1 tablet, Oral, Daily, Marguerita Merles Latif, DO, 1 tablet at 09/05/23 8119   nitroGLYCERIN (NITROSTAT) SL tablet 0.4 mg, 0.4 mg, Sublingual, Q5 Min x 3 PRN, Sheikh, Omair Latif, DO   ondansetron [facility]) injection 4 mg, 4 mg, Intravenous, Q6H PRN, Howerter, Justin B, DO   pantoprazole (PROTONIX) injection 40 mg, 40 mg, Intravenous, Q12H, Howerter, Justin B, DO, 40 mg at 09/05/23 0943  Vitals   Vitals:   09/05/23 0004 09/05/23 0356 09/05/23 0622 09/05/23 0755  BP: (!) 140/70 (!) 160/69  (!) 175/66  Pulse: 67 (!) 59  (!) 58  Resp: 16 18  17   Temp: 98.5 F (36.9 C) 97.8 F (36.6 C)  98.2 F (36.8 C)  TempSrc: Oral Oral  Oral  SpO2: 100% 100%  100%  Weight:   68.9 kg   Height:        Body mass index is 26.07 kg/m.  Physical Exam   Constitutional: Appears well-developed and well-nourished.  Neurologic Examination    Neuro: Mental Status: Patient is awake, alert, speech is somewhat unintelligible, though at times he does say formed words which are clear.  He is unable to identify his wife by name, but does seem to recognize her.  He is able to count my fingers when prompted and staring directly at them, but I am unable to get him to do so to check fields.  He follows some commands, but has difficulty following anything even slightly complex and does not reliably even follow simple commands. Cranial Nerves: II:  Blinks to threat bilaterally. III,IV, VI: EOMI without ptosis or diploplia.  V: Facial sensation is symmetric to temperature VII: Facial movement is symmetric, though he does not cooperate to smile Motor: He is able to lift all extremities against gravity, I suspect he may have very mild drift in his right upper extremity. Sensory: He does not reliably answer questions regarding sensation Cerebellar: He does not cooperate with testing, but no clear ataxia noted       Labs/Imaging/Neurodiagnostic studies  CBC:  Recent Labs  Lab 09/04/23 1133 09/04/23 1646 09/05/23 0549  WBC 4.1  --  6.0  NEUTROABS 2.3  --  3.5  HGB 7.9* 9.9* 9.5*  HCT 23.8* 28.1* 26.4*  MCV 94.8  --  90.1  PLT 170  --  194   Basic Metabolic Panel:  Lab Results  Component Value Date   NA 136 09/05/2023   K 4.0 09/05/2023   CO2 22 09/05/2023   GLUCOSE 109 (H) 09/05/2023   BUN 18 09/05/2023   CREATININE 1.09 09/05/2023   CALCIUM 8.0 (L) 09/05/2023   GFRNONAA >60 09/05/2023   GFRAA >60 02/15/2018   Lipid Panel:  Lab Results  Component Value Date   LDLCALC 88 07/04/2023   HgbA1c:  Lab Results  Component Value Date   HGBA1C 5.9 (H) 09/04/2023   Urine Drug Screen: No results found for: "LABOPIA", "COCAINSCRNUR", "LABBENZ", "AMPHETMU", "THCU", "LABBARB"  Alcohol Level No results found for: "ETH" INR  Lab Results  Component Value Date   INR 1.7 (H) 09/03/2023   APTT  Lab Results  Component Value Date   APTT 156 (H) 03/16/2012   MRI Brain(Personally reviewed): There does appear to be a pontine stroke  ASSESSMENT   Alexander Donaldson is a 89 y.o. male with a history of hypertension, diabetes, atrial fibrillation on anticoagulation with Eliquis and aspirin who presents with right pontine stroke as well as likely upper GI bleed.  Given his advanced dementia, I do think we are at the point of having to consider how aggressive he would want his care to be.  If an EGD does reveal a clear source  of bleeding that is addressable, then I think I would favor continuing anticoagulation to assist preventing strokes that would interfere with keeping him at home as long as possible.  He is on 40 of atorvastatin daily, and with an LDL of 88 I would favor increasing this.  My suspicion is that this is more likely a small vessel ischemic infarct as opposed to an embolus, but difficult to be certain.    RECOMMENDATIONS  If there is an easily addressable solution to his upper GI bleeding, then I would favor anticoagulation.   Increase atorvastatin from 40->80 Continue DM control with goal A1c less than seven I do not think vascular imaging would be at all likely to change our management, and therefore I will cancel his CTA. Could consider palliative care conversations, given that he does have two competing illnesses at this point (stroke and GI bleeding) to help family plan on what type of interventions they would want in the future, and consider placing limitations on his care. Stroke team will follow ______________________________________________________________________    Signed, Ritta Slot, MD [facility]

## 2023-09-05 NOTE — Evaluation (Signed)
 Physical Therapy Evaluation Patient Details Name: Alexander Donaldson MRN: [id number] DOB: 07-02-1934 Today's Date: 09/05/2023  History of Present Illness  89 y.o. male presents 09/04/23 with increasing difficulty walking and some right arm dysfunction. Admitted with suspected acute upper GIB complicated by acute blood loss anemia. MRI shows acute R pontine CVA. PMH atrial fibrillation on Eliquis, diabetes, hypertension, Alzheimer's, coronary disease  Clinical Impression   Pt admitted secondary to problem above with deficits below. PTA patient was always supervised by his wife, Misty Stanley. He could walk inside home with no device and in the community would either push a cart or ride in a wheelchair. Pt currently requires min assist for ambulation with HHA.  Anticipate patient will benefit from PT to address problems listed below with anticipated return to baseline. Will continue to follow acutely to maximize functional mobility independence and safety.  No followup PT recommended.          If plan is discharge home, recommend the following: A little help with walking and/or transfers;Direct supervision/assist for medications management;Direct supervision/assist for financial management;Supervision due to cognitive status;Help with stairs or ramp for entrance   Can travel by private vehicle        Equipment Recommendations None recommended by PT  Recommendations for Other Services       Functional Status Assessment Patient has had a recent decline in their functional status and demonstrates the ability to make significant improvements in function in a reasonable and predictable amount of time.     Precautions / Restrictions Precautions Precautions: Fall      Mobility  Bed Mobility Overal bed mobility: Needs Assistance Bed Mobility: Supine to Sit, Sit to Supine     Supine to sit: Min assist Sit to supine: Min assist   General bed mobility comments: assist to scoot pelvis forward once  sitting; assist to raise LLE up onto bed    Transfers Overall transfer level: Needs assistance Equipment used: 2 person hand held assist Transfers: Sit to/from Stand Sit to Stand: Min assist           General transfer comment: x2 reps from EOB    Ambulation/Gait Ambulation/Gait assistance: Min assist Gait Distance (Feet): 150 Feet Assistive device: 2 person hand held assist Gait Pattern/deviations: Step-through pattern, Decreased stride length, Shuffle       General Gait Details: Wife assisted on one side; shuffling gait improved once pt in hallway and taking larger steps  Stairs            Wheelchair Mobility     Tilt Bed    Modified Rankin (Stroke Patients Only) Modified Rankin (Stroke Patients Only) Pre-Morbid Rankin Score: Moderately severe disability Modified Rankin: Moderately severe disability     Balance Overall balance assessment: Mild deficits observed, not formally tested                                           Pertinent Vitals/Pain Pain Assessment Pain Assessment: No/denies pain Faces Pain Scale: No hurt    Home Living Family/patient expects to be discharged to:: Private residence Living Arrangements: Spouse/significant other Available Help at Discharge: Family;Available 24 hours/day Type of Home: House Home Access: Ramped entrance       Home Layout: One level Home Equipment: Rollator (4 wheels);BSC/3in1;Wheelchair - manual (wheelchair in disrepair; wife plans to address through [place])      Prior Function Prior Level of  Function : Needs assist  Cognitive Assist : Mobility (cognitive);ADLs (cognitive) Mobility (Cognitive): Intermittent cues         Mobility Comments: walking independently without device inside home; wife pushes him in wheelchair or he walks pushing a cart in the store       Extremity/Trunk Assessment   Upper Extremity Assessment Upper Extremity Assessment: Defer to OT evaluation    Lower  Extremity Assessment Lower Extremity Assessment: Generalized weakness    Cervical / Trunk Assessment Cervical / Trunk Assessment: Normal  Communication   Communication Communication: Impaired Factors Affecting Communication: Difficulty expressing self;Reduced clarity of speech    Cognition Arousal: Alert Behavior During Therapy: WFL for tasks assessed/performed   PT - Cognitive impairments: History of cognitive impairments                         Following commands: Impaired Following commands impaired: Follows one step commands inconsistently, Follows one step commands with increased time     Cueing Cueing Techniques: Verbal cues, Gestural cues     General Comments General comments (skin integrity, edema, etc.): wife present and able to understand pt's speech    Exercises     Assessment/Plan    PT Assessment Patient needs continued PT services  PT Problem List Decreased strength;Decreased activity tolerance;Decreased balance;Decreased mobility;Decreased cognition;Decreased safety awareness       PT Treatment Interventions Gait training;Functional mobility training;Therapeutic activities;Therapeutic exercise;Balance training;Patient/family education    PT Goals (Current goals can be found in the Care Plan section)  Acute Rehab PT Goals Patient Stated Goal: none stated PT Goal Formulation: With patient/family Time For Goal Achievement: 09/19/23 Potential to Achieve Goals: Good    Frequency Min 1X/week     Co-evaluation               AM-PAC PT "6 Clicks" Mobility  Outcome Measure Help needed turning from your back to your side while in a flat bed without using bedrails?: None Help needed moving from lying on your back to sitting on the side of a flat bed without using bedrails?: None Help needed moving to and from a bed to a chair (including a wheelchair)?: A Little Help needed standing up from a chair using your arms (e.g., wheelchair or bedside  chair)?: A Little Help needed to walk in hospital room?: A Little Help needed climbing 3-5 steps with a railing? : A Lot 6 Click Score: 19    End of Session Equipment Utilized During Treatment: Gait belt Activity Tolerance: Patient tolerated treatment well Patient left: in bed;with call bell/phone within reach;with bed alarm set;with family/visitor present Nurse Communication: Mobility status PT Visit Diagnosis: Other abnormalities of gait and mobility (R26.89)    Time: 4403-4742 PT Time Calculation (min) (ACUTE ONLY): 26 min   Charges:   PT Evaluation $PT Eval Low Complexity: 1 Low PT Treatments $Gait Training: 8-22 mins PT General Charges $$ ACUTE PT VISIT: 1 Visit          Jerolyn Center, PT Acute Rehabilitation Services  Office [phone]   Zena Amos 09/05/2023, 1:24 PM

## 2023-09-05 NOTE — TOC Initial Note (Signed)
 Transition of Care [facility]) - Initial/Assessment Note    Patient Details  Name: Alexander Donaldson MRN: [id number] Date of Birth: 1933/09/24  [facility] CM/SW Contact:    Kermit Balo, RN Phone Number: 09/05/2023, 4:33 PM  Clinical Narrative:                  CM met with the patient and his spouse. Pt slept through the visit.  Wife asking for wheelchair for home. CM will send needed forms to [facility] to have them fill the DME order. [facility]. No follow up per PT.  Wife manages his medications and provides needed transportation. TOC following.  Expected Discharge Plan: Home/Self Care Barriers to Discharge: Continued Medical Work up   Patient Goals and CMS Choice            Expected Discharge Plan and Services   Discharge Planning Services: CM Consult   Living arrangements for the past 2 months: Single Family Home                 DME Arranged: Wheelchair manual   Date DME Agency Contacted: 09/05/23   Representative spoke with at DME Agency: [facility]            Prior Living Arrangements/Services Living arrangements for the past 2 months: Single Family Home Lives with:: Spouse                   Activities of Daily Living   ADL Screening (condition at time of admission) Independently performs ADLs?: No Does the patient have a NEW difficulty with bathing/dressing/toileting/self-feeding that is expected to last >3 days?: No (needs assist) Does the patient have a NEW difficulty with getting in/out of bed, walking, or climbing stairs that is expected to last >3 days?: No (needs assist) Does the patient have a NEW difficulty with communication that is expected to last >3 days?: No Is the patient deaf or have difficulty hearing?: Yes Does the patient have difficulty seeing, even when wearing glasses/contacts?: No Does the patient have difficulty concentrating, remembering, or making decisions?: Yes  (dementia)  Permission Sought/Granted                  Emotional Assessment           Psych Involvement: No (comment)  Admission diagnosis:  TIA (transient ischemic attack) [G45.9] Weakness [R53.1] Gastrointestinal hemorrhage, unspecified gastrointestinal hemorrhage type [K92.2] Patient Active Problem List   Diagnosis Date Noted   TIA (transient ischemic attack) 09/04/2023   Acute upper GI bleed 09/04/2023   Acute blood loss anemia 09/04/2023   Facial droop 09/04/2023   Weakness of right upper extremity 09/04/2023   AKI (acute kidney injury) (HCC) 09/04/2023   Paroxysmal atrial fibrillation (HCC) 09/04/2023   Chronic combined systolic and diastolic heart failure (HCC) 09/04/2023   Atrial fibrillation with RVR (HCC) 07/02/2023   NSTEMI, initial episode of care (HCC) 07/02/2023   Exposure to COVID-19 virus 03/23/2023   Dysphagia, unspecified 02/01/2023   Encounter for screening for eye and ear disorders 02/01/2023   Arthralgia 07/05/2022   NSTEMI (non-ST elevated myocardial infarction) (HCC)    Accelerated junctional rhythm 01/30/2022   Unspecified dementia, unspecified severity, without behavioral disturbance, psychotic disturbance, mood disturbance, and anxiety (HCC) 01/30/2022   Carpal tunnel syndrome, bilateral 02/25/2020   Numbness 11/28/2019   Raynaud's phenomenon 10/30/2019   CAD (coronary artery disease) 02/12/2018   Left foot drop 02/12/2018  DM2 (diabetes mellitus, type 2) (HCC) 02/12/2018   Essential hypertension 02/12/2018   Hypokalemia 02/12/2018   PCP:  [facility]:   [facility] [place], [place] - [address] [place] [address] Phone: [phone] Fax: [phone]  [facility] [address] [place] [address] Phone: [phone] Fax: [phone]     Social Drivers of Health (SDOH) Social History: SDOH Screenings   Food Insecurity: Patient Unable To Answer  (09/04/2023)  Housing: Patient Unable To Answer (09/04/2023)  Transportation Needs: Patient Unable To Answer (09/04/2023)  Utilities: Patient Unable To Answer (09/04/2023)  Alcohol Screen: Low Risk  (07/02/2023)  Depression (PHQ2-9): Low Risk  (07/02/2023)  Financial Resource Strain: Low Risk  (07/02/2023)  Physical Activity: Inactive (07/02/2023)  Social Connections: Unknown (09/04/2023)  Recent Concern: Social Connections - Socially Isolated (07/02/2023)  Stress: Patient Unable To Answer (07/02/2023)  Tobacco Use: Low Risk  (09/04/2023)  Health Literacy: Patient Unable To Answer (07/02/2023)   SDOH Interventions:     Readmission Risk Interventions     No data to display

## 2023-09-05 NOTE — Progress Notes (Addendum)
 PROGRESS NOTE    Alexander Donaldson  ZOX:[id number] DOB: 1933/08/08 DOA: 09/03/2023 PCP: [facility]   Brief Narrative:  The patient is an 89 year old significant for advanced dementia, PAF on AC with Apixaban, DMT2, Essential HTN, Chronic Systolic and Diastolic CHF who presented to [facility] on 09/03/2023 with a right facial droop, slurred speech, and inability to ambulate.  Further workup was done and he found to have an acute CVA so Neuro was consulted. In review of his prior records, his Hgb started dropping since the last few months and it was noted to be 12.8 on 07/04/2023 and is dropped to 8.1 on admission so FOBT was obtained this was positive. GI was consulted and offered the patient an EGD and family currently deliberating about whether pursing it or not. Currently his anticoagulation has been held. Neurology recommends and favors continued anticoagulation if possible and if there is an easily addressable solution for suspicion of upper GIB.  Palliative care consulted for further GOC discussion given his comorbidities.   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  As well as generalized cerebral and cerebellar atrophy -Neurology to be consulted for further evaluation and recommendations currently have increased his statin and recommending continue diabetes mellitus control. They do not feel vascular imaging would change their  management so they canceled his CTA of the head and neck. -Neurology recommends recommend favoring anticoagulation -ECHO showed LVEF of 60-65% with no RWMA, mild concentric LVH of the basal-septal segment and LVDP c/w G1DD. Normal RVSF -PT recommending no F/U and SLP placed on Diet  -Lipid Panel in AM  Neurochecks per Protocol and Telemetry Monitoring  -Palliative Consulted for GOC Discussion  Suspected GI Bleeding and Heme Positive Stools Noromocytic Anemia: Holding his anticoagulation and aspirin as he is +FOBT. Has had a 4 g drop in the last few months. T/S and transfused 1 unit PRBCs and GI has been consulted recommend keeping hemoglobin above 8 given his advanced age, comorbidities and acute CVA. PPI BID now. Anemia panel done and showed an iron level 41, UIBC 263, TIBC of 304, saturation ratios of 14%, ferritin level of 9. Hgb/Hct improved and is now 9.5/26.4. CTM for S/Sx of Bleeding; Repeat CBC in the AM  -Family still considering whether to pursue EGD recommended and will decide by tomorrow morning if they wish to pursue that and colonoscopy is not recommended at this time  Chronic Systolic and Diastolic CHF -BNP on Admit was 174.6. Given the diuresis after blood transfusion and appears Euvolemic. Strict I's and O's and Daily Weights; is Negative 2.414 Liters since Admit. CTM for S/Sx of volume Overload  Paroxysmal Atrial Fibrillation: Holding AC given concern for GI Bleed; CHA2DS2-VASc of 5. CTM on the Progressive Unit Telemetry.Resumed home Amiodarone now that he is clear by SLP  Essential Hypertension: Given his acute CVA will allow for permissive hypertension as well. SLP now recommending Diet. CTM BP per Protocol. Last BP reading was 134/58  AKI on CKD Stage  3b: Improved. BUN is now 18/1.09 (Admission was 34/1.72). Avoid Nephrotoxic Medications, Contrast Dyes, Hypotension and Dehydration to Ensure Adequate Renal Perfusion and will need to Renally Adjust Meds. CTMand Trend Renal  Function carefully and repeat CMP in the AM   Diabetes Mellitus Type 2: HbA1c is 5.9. CTM CBGs per protocol with very sensitive NovoLog/scale insulin AC. CBGs ranging from 104-159  Hypokalemia: K+ Improved to 4.0 after repletion. CTM and Trend and repeat CMP in the AM   Hypoalbuminemia: Patient's Albumin was 2.8 on last check. CTM and Trend and repeat CMP in the AM  Overweight: Complicates overall prognosis and care.Estimated body mass index is 26.07 kg/m as calculated from the following:   Height as of this encounter: 5\' 4"  (1.626 m).   Weight as of this encounter: 68.9 kg.  -Weight Loss and Dietary Counseling given   DVT prophylaxis: SCDs Start: 09/04/23 0121    Code Status: Limited: Do not attempt resuscitation (DNR) -DNR-LIMITED -Do Not Intubate/DNI  Family Communication: Discussed with wife at bedside   Disposition Plan:  Level of care: Progressive Status is: Inpatient Remains inpatient appropriate because: Needs further clinical workup and evaluation for his drop in hemoglobin and discussion with neurology about anticoagulation for his stroke   Consultants:  Neurology Gastroenterology Palliative care medicine  Procedures:  As delineated as above  Antimicrobials:  Anti-infectives (From admission, onward)    None       Subjective: Seen and examined at bedside and he was doing fairly okay.  Wife states that he had a good night after he finally fell asleep.  No nausea or vomiting.  Patient denies any complaints at this time.  Objective: Vitals:   09/05/23 0755 09/05/23 1100 09/05/23 1559 09/05/23 1953  BP: (!) 175/66 (!) 159/72 122/76 (!) 134/58  Pulse: (!) 58 65 (!) 58 (!) 59  Resp: 17 17 17 17   Temp: 98.2 F (36.8 C) 97.7 F (36.5 C) 98.1 F (36.7 C) 98.6 F (37 C)  TempSrc: Oral Oral Oral Oral  SpO2: 100% 100% 100% 100%  Weight:      Height:        Intake/Output Summary (Last 24 hours) at 09/05/2023 2357 Last data filed at 09/05/2023 1651 Gross per 24  hour  Intake 385.43 ml  Output 1170 ml  Net -784.57 ml   Filed Weights   09/04/23 1615 09/05/23 0622  Weight: 67.2 kg 68.9 kg   Examination: Physical Exam:  Constitutional: Elderly chronically ill-appearing African-American male in no acute distress and is pleasantly confused Respiratory: Diminished to auscultation bilaterally, no wheezing, rales, rhonchi or crackles. Normal respiratory effort and patient is not tachypenic. No accessory muscle use.  Unlabored breathing Cardiovascular: RRR, no murmurs / rubs / gallops. S1 and S2 auscultated. No extremity edema.  Abdomen: Soft, non-tender, a little distended secondary to body habitus. Bowel sounds positive.  GU: Deferred. Musculoskeletal: No clubbing / cyanosis of digits/nails. No joint deformity upper and lower extremities. Skin: No rashes, lesions, ulcers on limited skin evaluation. No induration; Warm and dry.  Neurologic: CN 2-12 grossly intact with no focal deficits. Romberg sign and cerebellar reflexes not assessed.  Psychiatric: Patient is pleasantly confused and only oriented to himself  Data Reviewed: I have personally reviewed following labs and imaging studies  CBC: Recent Labs  Lab 09/03/23 1848 09/04/23 1133 09/04/23 1646 09/05/23 0549  WBC 5.5 4.1  --  6.0  NEUTROABS 2.7 2.3  --  3.5  HGB 8.1* 7.9* 9.9* 9.5*  HCT 24.8* 23.8* 28.1*  26.4*  MCV 96.1 94.8  --  90.1  PLT 211 170  --  194   Basic Metabolic Panel: Recent Labs  Lab 09/03/23 1848 09/04/23 1133 09/05/23 0549  NA 137 138 136  K 4.1 3.4* 4.0  CL 103 107 108  CO2 24 25 22   GLUCOSE 160* 124* 109*  BUN 34* 23 18  CREATININE 1.72* 1.27* 1.09  CALCIUM 8.7* 8.2* 8.0*  MG  --  1.9  --   PHOS  --  3.3  --    GFR: Estimated Creatinine Clearance: 40 mL/min (by C-G formula based on SCr of 1.09 mg/dL). Liver Function Tests: Recent Labs  Lab 09/03/23 1848 09/04/23 1133  AST 19 16  ALT 16 14  ALKPHOS 47 42  BILITOT 0.7 1.1  PROT 6.3* 5.3*  ALBUMIN  3.4* 2.8*   No results for input(s): "LIPASE", "AMYLASE" in the last 168 hours. No results for input(s): "AMMONIA" in the last 168 hours. Coagulation Profile: Recent Labs  Lab 09/03/23 1848  INR 1.7*   Cardiac Enzymes: No results for input(s): "CKTOTAL", "CKMB", "CKMBINDEX", "TROPONINI" in the last 168 hours. BNP (last 3 results) No results for input(s): "PROBNP" in the last 8760 hours. HbA1C: Recent Labs    09/04/23 1133  HGBA1C 5.9*   CBG: Recent Labs  Lab 09/04/23 1637 09/05/23 0005 09/05/23 0611 09/05/23 1203 09/05/23 1715  GLUCAP 129* 160* 104* 159* 146*   Lipid Profile: No results for input(s): "CHOL", "HDL", "LDLCALC", "TRIG", "CHOLHDL", "LDLDIRECT" in the last 72 hours. Thyroid Function Tests: No results for input(s): "TSH", "T4TOTAL", "FREET4", "T3FREE", "THYROIDAB" in the last 72 hours. Anemia Panel: Recent Labs    09/04/23 1133 09/04/23 1646  FERRITIN 9*  --   TIBC 304  --   IRON 41*  --   RETICCTPCT  --  2.5   Sepsis Labs: Recent Labs  Lab 09/03/23 1951  LATICACIDVEN 1.7    Recent Results (from the past 240 hours)  Blood culture (routine x 2)     Status: None (Preliminary result)   Collection Time: 09/03/23  6:57 PM   Specimen: BLOOD LEFT HAND  Result Value Ref Range Status   Specimen Description BLOOD LEFT HAND  Final   Special Requests   Final    BOTTLES DRAWN AEROBIC AND ANAEROBIC Blood Culture results may not be optimal due to an inadequate volume of blood received in culture bottles   Culture   Final    NO GROWTH 2 DAYS Performed at [facility], [address], [place], [place] [address]    Report Status PENDING  Incomplete  Blood culture (routine x 2)     Status: None (Preliminary result)   Collection Time: 09/03/23  7:02 PM   Specimen: BLOOD  Result Value Ref Range Status   Specimen Description BLOOD RIGHT ANTECUBITAL  Final   Special Requests   Final    BOTTLES DRAWN AEROBIC AND ANAEROBIC Blood Culture adequate volume    Culture   Final    NO GROWTH 2 DAYS Performed at [facility], [address] [place], [place] [address]    Report Status PENDING  Incomplete    Radiology Studies: ECHOCARDIOGRAM COMPLETE Result Date: 09/05/2023    ECHOCARDIOGRAM REPORT   Patient Name:   Alexander Donaldson Date of Exam: 09/05/2023 Medical Rec #:  [id number]       Height:       64.0 in Accession #:    [id number]  Weight:       151.9 lb Date of Birth:  1934/03/27        BSA:          1.740 m Patient Age:    89 years        BP:           175/66 mmHg Patient Gender: M               HR:           62 bpm. Exam Location:  Inpatient Procedure: 2D Echo, Cardiac Doppler and Color Doppler (Both Spectral and Color            Flow Doppler were utilized during procedure). Indications:    Stroke I63.9  History:        Patient has prior history of Echocardiogram examinations, most                 recent 07/02/2023. Previous Myocardial Infarction and CAD, TIA,                 Arrythmias:Atrial Fibrillation; Risk Factors:Diabetes.  Sonographer:    Lucendia Herrlich RCS Referring Phys: [id number] Kateri Mc LATIF [facility] IMPRESSIONS  1. Left ventricular ejection fraction, by estimation, is 60 to 65%. The left ventricle has normal function. The left ventricle has no regional wall motion abnormalities. There is mild concentric left ventricular hypertrophy of the basal-septal segment. Left ventricular diastolic parameters are consistent with Grade I diastolic dysfunction (impaired relaxation).  2. Right ventricular systolic function is normal. The right ventricular size is normal.  3. Left atrial size was mildly dilated.  4. The mitral valve is normal in structure. Trivial mitral valve regurgitation. No evidence of mitral stenosis.  5. The aortic valve is moderately calcified. The right and left coronary cusps are fused. The aortic valve is tricuspid. There is mild calcification of the aortic valve. Aortic valve regurgitation is trivial. Mild aortic valve stenosis.  FINDINGS  Left Ventricle: Left ventricular ejection fraction, by estimation, is 60 to 65%. The left ventricle has normal function. The left ventricle has no regional wall motion abnormalities. Strain imaging was not performed. The left ventricular internal cavity  size was normal in size. There is mild concentric left ventricular hypertrophy of the basal-septal segment. Left ventricular diastolic parameters are consistent with Grade I diastolic dysfunction (impaired relaxation). Right Ventricle: The right ventricular size is normal. No increase in right ventricular wall thickness. Right ventricular systolic function is normal. Left Atrium: Left atrial size was mildly dilated. Right Atrium: Right atrial size was normal in size. Pericardium: There is no evidence of pericardial effusion. Mitral Valve: The mitral valve is normal in structure. Mild mitral annular calcification. Trivial mitral valve regurgitation. No evidence of mitral valve stenosis. Tricuspid Valve: The tricuspid valve is normal in structure. Tricuspid valve regurgitation is trivial. No evidence of tricuspid stenosis. Aortic Valve: The aortic valve is moderately calcified. The right and left coronary cusps are fused. The aortic valve is tricuspid. There is mild calcification of the aortic valve. Aortic valve regurgitation is trivial. Mild aortic stenosis is present. Aortic valve mean gradient measures 5.0 mmHg. Aortic valve peak gradient measures 10.0 mmHg. Aortic valve area, by VTI measures 1.50 cm. Pulmonic Valve: The pulmonic valve was normal in structure. Pulmonic valve regurgitation is trivial. No evidence of pulmonic stenosis. Aorta: The aortic root is normal in size and structure. Venous: The inferior vena cava was not well visualized. IAS/Shunts: No atrial level shunt detected by color flow  Doppler. Additional Comments: 3D imaging was not performed.  LEFT VENTRICLE PLAX 2D LVIDd:         3.30 cm     Diastology LVIDs:         2.60 cm     LV e'  medial:    4.57 cm/s LV PW:         0.90 cm     LV E/e' medial:  10.2 LV IVS:        1.20 cm     LV e' lateral:   5.98 cm/s LVOT diam:     2.10 cm     LV E/e' lateral: 7.8 LV SV:         50 LV SV Index:   29 LVOT Area:     3.46 cm  LV Volumes (MOD) LV vol d, MOD A2C: 80.8 ml LV vol d, MOD A4C: 64.8 ml LV vol s, MOD A2C: 41.9 ml LV vol s, MOD A4C: 35.9 ml LV SV MOD A2C:     38.9 ml LV SV MOD A4C:     64.8 ml LV SV MOD BP:      36.1 ml RIGHT VENTRICLE RV S prime:     14.30 cm/s TAPSE (M-mode): 1.0 cm LEFT ATRIUM             Index        RIGHT ATRIUM          Index LA diam:        3.90 cm 2.24 cm/m   RA Area:     8.47 cm LA Vol (A2C):   51.5 ml 29.59 ml/m  RA Volume:   12.70 ml 7.30 ml/m LA Vol (A4C):   48.4 ml 27.81 ml/m LA Biplane Vol: 49.2 ml 28.27 ml/m  AORTIC VALVE AV Area (Vmax):    1.63 cm AV Area (Vmean):   1.45 cm AV Area (VTI):     1.50 cm AV Vmax:           158.00 cm/s AV Vmean:          108.000 cm/s AV VTI:            0.333 m AV Peak Grad:      10.0 mmHg AV Mean Grad:      5.0 mmHg LVOT Vmax:         74.15 cm/s LVOT Vmean:        45.200 cm/s LVOT VTI:          0.144 m LVOT/AV VTI ratio: 0.43  AORTA Ao Root diam: 3.60 cm Ao Asc diam:  3.50 cm MITRAL VALVE               TRICUSPID VALVE MV Area (PHT): 2.20 cm    TR Peak grad:   18.5 mmHg MV Decel Time: 345 msec    TR Vmax:        215.00 cm/s MV E velocity: 46.60 cm/s MV A velocity: 95.40 cm/s  SHUNTS MV E/A ratio:  0.49        Systemic VTI:  0.14 m                            Systemic Diam: 2.10 cm Arvilla Meres MD Electronically signed by Arvilla Meres MD Signature Date/Time: 09/05/2023/2:08:26 PM    Final    MR Brain Wo Contrast (neuro protocol) Result Date: 09/04/2023 CLINICAL DATA:  Provided history: Transient ischemic attack (TIA). Additional history  provided: Facial droop, slurred speech, weakness, confusion, agitation. EXAM: MRI HEAD WITHOUT CONTRAST TECHNIQUE: Multiplanar, multiecho pulse sequences of the brain and surrounding  structures were obtained without intravenous contrast. COMPARISON:  Head CT 09/03/2023.  Brain MRI 03/31/2020. FINDINGS: Significantly motion degraded examination, limiting evaluation. Most notably, the axial diffusion-weighted sequence is severely motion degraded, the axial SWI sequence is severely motion degraded and the axial FLAIR sequence is severely motion degraded. Within these limitations, findings are as follows. Brain: Moderate-to-advanced generalized cerebral atrophy. Commensurate prominence of the ventricles sulci. Mild cerebellar atrophy. 16 mm acute infarct within the right aspect of the pons (for instance as seen on series 3, image 16) (series 4, image 17). Multifocal T2 FLAIR hyperintense signal abnormality within the cerebral white matter, nonspecific but compatible with mild-to-moderate chronic small vessel ischemic disease. Chronic lacunar infarct within the left thalamus. No evidence of an intracranial mass. No extra-axial fluid collection. No midline shift. Vascular: Maintained flow voids within the proximal large arterial vessels. Skull and upper cervical spine: No focal worrisome marrow lesion. Ligamentous hypertrophy about the dens. Incompletely assessed cervical spondylosis. Sinuses/Orbits: No mass or acute finding within the imaged orbits. Mild mucosal thickening within a posterior right ethmoid air cell. IMPRESSION: 1. Significantly motion degraded examination. Within this limitation, findings are as follows. 2. 16 mm acute infarct within the right aspect of the pons. 3. Mild-to-moderate chronic small vessel ischemic changes within the cerebral white matter. 4. Chronic left thalamic lacunar infarct. 5. Moderate-to-advanced generalized cerebral atrophy. 6. Mild cerebellar atrophy. 7. Minor right ethmoid sinus disease. Electronically Signed   By: Jackey Loge D.O.   On: 09/04/2023 10:21   Scheduled Meds:  haloperidol lactate       amiodarone  200 mg Oral Daily   [START ON 09/06/2023]  atorvastatin  80 mg Oral Daily   insulin aspart  0-6 Units Subcutaneous Q6H   memantine  10 mg Oral BID   multivitamin with minerals  1 tablet Oral Daily   pantoprazole (PROTONIX) IV  40 mg Intravenous Q12H   Continuous Infusions:   LOS: 1 day   Marguerita Merles, DO [facility] Available via Epic secure chat 7am-7pm After these hours, please refer to coverage provider listed on [email or web address] 09/05/2023, 11:57 PM

## 2023-09-05 NOTE — Evaluation (Signed)
 Clinical/Bedside Swallow Evaluation Patient Details  Name: Alexander Donaldson MRN: [id number] Date of Birth: 02/14/1934  Today's Date: 09/05/2023 Time: SLP Start Time (ACUTE ONLY): 0813 SLP Stop Time (ACUTE ONLY): 0837 SLP Time Calculation (min) (ACUTE ONLY): 24 min  Past Medical History:  Past Medical History:  Diagnosis Date   Acute encephalopathy 02/12/2018   Alzheimer's dementia (HCC)    CAD (coronary artery disease)    CAP (community acquired pneumonia) 02/11/2018   Carpal tunnel syndrome, bilateral 02/25/2020   Coronary artery disease    Diabetes mellitus    Diverticulitis    Hearing loss    Hypertension    Hypokalemia 02/12/2018   NSTEMI (non-ST elevated myocardial infarction) [facility])    Past Surgical History:  Past Surgical History:  Procedure Laterality Date   ANGIOPLASTY     CORONARY STENT INTERVENTION N/A 02/02/2017   Procedure: Coronary Stent Intervention;  Surgeon: Rinaldo Cloud, MD;  Location: [facility] INVASIVE CV LAB;  Service: Cardiovascular;  Laterality: N/A;   CORONARY STENTS     LEFT HEART CATH AND CORONARY ANGIOGRAPHY N/A 02/02/2017   Procedure: Left Heart Cath and Coronary Angiography;  Surgeon: Rinaldo Cloud, MD;  Location: [facility] INVASIVE CV LAB;  Service: Cardiovascular;  Laterality: N/A;   LEFT HEART CATHETERIZATION WITH CORONARY ANGIOGRAM N/A 03/16/2012   Procedure: LEFT HEART CATHETERIZATION WITH CORONARY ANGIOGRAM;  Surgeon: Robynn Pane, MD;  Location: [facility] CATH LAB;  Service: Cardiovascular;  Laterality: N/A;   PERCUTANEOUS CORONARY STENT INTERVENTION (PCI-S)  03/16/2012   Procedure: PERCUTANEOUS CORONARY STENT INTERVENTION (PCI-S);  Surgeon: Robynn Pane, MD;  Location: [facility] CATH LAB;  Service: Cardiovascular;;   TONSILLECTOMY     HPI:  Alexander Donaldson is an 89 yo male presenting to ED 2/23 with acute onset R facial droop and RUE weakness. Admitted with suspected acute upper GIB complicated by acute blood loss anemia. MRI shows acute R pontine CVA. PMH includes  advanced dementia (requires assistance with feeding), paroxymal A-fib chronically anticoagulated on Eliquis, T2DM, essential HTN, chronic systolic/diastolic heart failure    Assessment / Plan / Recommendation  Clinical Impression  Wife has been caring for pt since 2020. She reports she cuts his food up and is always with him when he eats. She states he eats fast and occasionally will cough if takes large sip. He has upper and lower dentures with a min-mild right sided facial asymmetry with moderately strong volitional cough. Observed with breakfast meal and he had mildly prolonged mastication but did not have residue. He tends to place multiple bites before clearing oral cavity and needs tactile cues (remove  utensil) to slow pt. His wife demonstrated appropriate cueing for pt to slow rate and clear mouth prior to subsequent bites. Pt appeared to protect his airway from subjective measure with one delayed throat clear and one delayed cough during assessment. Will continue pt on regular diet as wife is typically present and cuts up his food, continue thin liquids. Wife reports sometimes he chews his pills and she had them switched to liquid. The nurses have been crushing meds which has been working well. Pt needs full assist and supervision with meals. No further ST needed at this time. SLP Visit Diagnosis: Dysphagia, oral phase (R13.11)    Aspiration Risk  Mild aspiration risk    Diet Recommendation Regular;Thin liquid    Liquid Administration via: Cup;Straw Medication Administration: Crushed with puree Supervision: Staff to assist with self feeding Compensations: Slow rate;Small sips/bites Postural Changes: Seated upright at 90 degrees  Other  Recommendations Oral Care Recommendations: Oral care BID    Recommendations for follow up therapy are one component of a multi-disciplinary discharge planning process, led by the attending physician.  Recommendations may be updated based on patient  status, additional functional criteria and insurance authorization.  Follow up Recommendations No SLP follow up      Assistance Recommended at Discharge    Functional Status Assessment Patient has not had a recent decline in their functional status  Frequency and Duration            Prognosis        Swallow Study   General Date of Onset: 09/03/23 HPI: ETAI COPADO is an 89 yo male presenting to ED 2/23 with acute onset R facial droop and RUE weakness. Admitted with suspected acute upper GIB complicated by acute blood loss anemia. MRI shows acute R pontine CVA. PMH includes advanced dementia (requires assistance with feeding), paroxymal A-fib chronically anticoagulated on Eliquis, T2DM, essential HTN, chronic systolic/diastolic heart failure Type of Study: Bedside Swallow Evaluation Previous Swallow Assessment:  (wife said had MBS at [place] and "did good") Diet Prior to this Study: Regular;Thin liquids (Level 0) Temperature Spikes Noted: No Respiratory Status: Room air History of Recent Intubation: No Behavior/Cognition: Alert;Cooperative;Pleasant mood;Confused;Distractible;Requires cueing Oral Cavity Assessment: Within Functional Limits Oral Care Completed by SLP: No Oral Cavity - Dentition: Dentures, top;Dentures, bottom Vision: Functional for self-feeding Self-Feeding Abilities: Needs assist Patient Positioning: Upright in bed Baseline Vocal Quality: Normal Volitional Cough: Strong (moderately strong)    Oral/Motor/Sensory Function Overall Oral Motor/Sensory Function: Mild impairment Facial ROM: Reduced right;Suspected CN VII (facial) dysfunction Facial Symmetry: Abnormal symmetry right;Suspected CN VII (facial) dysfunction Lingual ROM: Within Functional Limits Lingual Symmetry: Within Functional Limits   Ice Chips Ice chips: Not tested   Thin Liquid Thin Liquid: Within functional limits Presentation: Cup;Straw    Nectar Thick Nectar Thick Liquid: Not tested   Honey  Thick Honey Thick Liquid: Not tested   Puree Puree: Within functional limits   Solid     Solid: Impaired Oral Phase Impairments:  (prolonged mastication) Oral Phase Functional Implications: Prolonged oral transit Pharyngeal Phase Impairments:  (none)      Campbell Agramonte, Breck Coons 09/05/2023,8:55 AM

## 2023-09-05 NOTE — Progress Notes (Signed)
 Echocardiogram 2D Echocardiogram has been performed.  Alexander Donaldson 09/05/2023, 2:01 PM

## 2023-09-06 ENCOUNTER — Inpatient Hospital Stay (HOSPITAL_COMMUNITY): Payer: No Typology Code available for payment source

## 2023-09-06 DIAGNOSIS — Z7189 Other specified counseling: Secondary | ICD-10-CM

## 2023-09-06 DIAGNOSIS — K922 Gastrointestinal hemorrhage, unspecified: Secondary | ICD-10-CM | POA: Diagnosis not present

## 2023-09-06 DIAGNOSIS — R195 Other fecal abnormalities: Secondary | ICD-10-CM | POA: Diagnosis not present

## 2023-09-06 DIAGNOSIS — D62 Acute posthemorrhagic anemia: Secondary | ICD-10-CM | POA: Diagnosis not present

## 2023-09-06 DIAGNOSIS — F0390 Unspecified dementia without behavioral disturbance: Secondary | ICD-10-CM

## 2023-09-06 DIAGNOSIS — I5042 Chronic combined systolic (congestive) and diastolic (congestive) heart failure: Secondary | ICD-10-CM

## 2023-09-06 DIAGNOSIS — I639 Cerebral infarction, unspecified: Secondary | ICD-10-CM

## 2023-09-06 DIAGNOSIS — D500 Iron deficiency anemia secondary to blood loss (chronic): Secondary | ICD-10-CM

## 2023-09-06 DIAGNOSIS — Z515 Encounter for palliative care: Secondary | ICD-10-CM | POA: Diagnosis not present

## 2023-09-06 LAB — GLUCOSE, CAPILLARY
Glucose-Capillary: 132 mg/dL — ABNORMAL HIGH (ref 70–99)
Glucose-Capillary: 143 mg/dL — ABNORMAL HIGH (ref 70–99)
Glucose-Capillary: 173 mg/dL — ABNORMAL HIGH (ref 70–99)

## 2023-09-06 LAB — CBC WITH DIFFERENTIAL/PLATELET
Abs Immature Granulocytes: 0.02 10*3/uL (ref 0.00–0.07)
Basophils Absolute: 0 10*3/uL (ref 0.0–0.1)
Basophils Relative: 1 %
Eosinophils Absolute: 0.2 10*3/uL (ref 0.0–0.5)
Eosinophils Relative: 4 %
HCT: 27.1 % — ABNORMAL LOW (ref 39.0–52.0)
Hemoglobin: 9.3 g/dL — ABNORMAL LOW (ref 13.0–17.0)
Immature Granulocytes: 0 %
Lymphocytes Relative: 29 %
Lymphs Abs: 1.8 10*3/uL (ref 0.7–4.0)
MCH: 31.1 pg (ref 26.0–34.0)
MCHC: 34.3 g/dL (ref 30.0–36.0)
MCV: 90.6 fL (ref 80.0–100.0)
Monocytes Absolute: 0.6 10*3/uL (ref 0.1–1.0)
Monocytes Relative: 10 %
Neutro Abs: 3.6 10*3/uL (ref 1.7–7.7)
Neutrophils Relative %: 56 %
Platelets: 200 10*3/uL (ref 150–400)
RBC: 2.99 MIL/uL — ABNORMAL LOW (ref 4.22–5.81)
RDW: 13.2 % (ref 11.5–15.5)
WBC: 6.4 10*3/uL (ref 4.0–10.5)
nRBC: 0 % (ref 0.0–0.2)

## 2023-09-06 LAB — COMPREHENSIVE METABOLIC PANEL
ALT: 14 U/L (ref 0–44)
AST: 17 U/L (ref 15–41)
Albumin: 3.2 g/dL — ABNORMAL LOW (ref 3.5–5.0)
Alkaline Phosphatase: 51 U/L (ref 38–126)
Anion gap: 6 (ref 5–15)
BUN: 22 mg/dL (ref 8–23)
CO2: 26 mmol/L (ref 22–32)
Calcium: 8.5 mg/dL — ABNORMAL LOW (ref 8.9–10.3)
Chloride: 105 mmol/L (ref 98–111)
Creatinine, Ser: 1.55 mg/dL — ABNORMAL HIGH (ref 0.61–1.24)
GFR, Estimated: 43 mL/min — ABNORMAL LOW (ref >60)
Glucose, Bld: 142 mg/dL — ABNORMAL HIGH (ref 70–99)
Potassium: 3.8 mmol/L (ref 3.5–5.1)
Sodium: 137 mmol/L (ref 135–145)
Total Bilirubin: 1 mg/dL (ref 0.0–1.2)
Total Protein: 6.1 g/dL — ABNORMAL LOW (ref 6.5–8.1)

## 2023-09-06 LAB — LIPID PANEL
Cholesterol: 141 mg/dL (ref 0–200)
HDL: 49 mg/dL (ref >40)
LDL Cholesterol: 82 mg/dL (ref 0–99)
Total CHOL/HDL Ratio: 2.9 {ratio}
Triglycerides: 48 mg/dL (ref <150)
VLDL: 10 mg/dL (ref 0–40)

## 2023-09-06 LAB — MAGNESIUM: Magnesium: 2.1 mg/dL (ref 1.7–2.4)

## 2023-09-06 LAB — PHOSPHORUS: Phosphorus: 3.5 mg/dL (ref 2.5–4.6)

## 2023-09-06 MED ORDER — AMLODIPINE BESYLATE 5 MG PO TABS
10.0000 mg | ORAL_TABLET | Freq: Every day | ORAL | Status: DC
  Administered 2023-09-07 – 2023-09-09 (×3): 10 mg via ORAL
  Filled 2023-09-06 (×3): qty 2

## 2023-09-06 MED ORDER — SODIUM CHLORIDE 0.9 % IV SOLN
150.0000 mg | INTRAVENOUS | Status: DC
  Administered 2023-09-06: 150 mg via INTRAVENOUS
  Filled 2023-09-06 (×2): qty 7.5

## 2023-09-06 MED ORDER — AMLODIPINE BESYLATE 5 MG PO TABS
5.0000 mg | ORAL_TABLET | Freq: Every day | ORAL | Status: DC
  Administered 2023-09-06: 5 mg via ORAL
  Filled 2023-09-06: qty 1

## 2023-09-06 MED ORDER — ATORVASTATIN CALCIUM 40 MG PO TABS
40.0000 mg | ORAL_TABLET | Freq: Every day | ORAL | Status: DC
  Administered 2023-09-07 – 2023-09-09 (×3): 40 mg via ORAL
  Filled 2023-09-06 (×3): qty 1

## 2023-09-06 NOTE — Progress Notes (Signed)
 Carotid duplex has been completed.   Results can be found under chart review under CV PROC. 09/06/2023 1:42 PM Faaris Arizpe RVT, RDMS

## 2023-09-06 NOTE — Evaluation (Addendum)
 Occupational Therapy Evaluation Patient Details Name: Alexander Donaldson MRN: [id number] DOB: [birth year] Today's Date: 09/06/2023   History of Present Illness   89 y.o. male presents 09/04/23 with increasing difficulty walking and some right arm dysfunction. Admitted with suspected acute upper GIB complicated by acute blood loss anemia. MRI shows acute R pontine CVA. PMH atrial fibrillation on Eliquis, diabetes, hypertension, Alzheimer's, coronary disease     Clinical Impressions PTA pt lived with his spouse who assisted him with bathing and dressing, but pt was able to perform simple grooming tasks without assist. At home pt walking independently without AD. Pt currently unsteady and needing min A +2 to maintain balance while ambulating and requires  Max to mod A with ADLs, limited ability to initiate ADLs without cueing. Given his impaired balance and cognitive deficits pt remains a high falls risk. Discussed with pt spouse the need for increased PCA staff at home due to level of care needed and reinforcing repetitive tasks to promote learning of skills to reduce burden of care, also discussed the need for 24hr supervision of the pt. OT to continue following pt acutely to address listed deficits and help transition to next level of care. Patient would benefit from post acute Home OT services to help maximize functional independence in natural environment      If plan is discharge home, recommend the following:   A lot of help with walking and/or transfers;A lot of help with bathing/dressing/bathroom;Assistance with cooking/housework;Direct supervision/assist for medications management;Other (comment) (increased caregiver support/PCAs)     Functional Status Assessment   Patient has had a recent decline in their functional status and demonstrates the ability to make significant improvements in function in a reasonable and predictable amount of time.     Equipment Recommendations   Wheelchair  (measurements OT);Wheelchair cushion (measurements OT)     Recommendations for Other Services         Precautions/Restrictions   Precautions Precautions: Fall Restrictions Weight Bearing Restrictions Per Provider Order: No     Mobility Bed Mobility Overal bed mobility: Needs Assistance Bed Mobility: Supine to Sit, Sit to Supine     Supine to sit: Min assist Sit to supine: Contact guard assist   General bed mobility comments: Min A to assist BLEs to EOB, increased time needed for bed mobility    Transfers Overall transfer level: Needs assistance Equipment used: 2 person hand held assist Transfers: Sit to/from Stand Sit to Stand: Min assist                  Balance Overall balance assessment: Needs assistance Sitting-balance support: Feet supported, No upper extremity supported Sitting balance-Leahy Scale: Good Sitting balance - Comments: donning bilat socks with good control of balance   Standing balance support: Bilateral upper extremity supported, During functional activity Standing balance-Leahy Scale: Poor Standing balance comment: reliant on OTs                           ADL either performed or assessed with clinical judgement   ADL Overall ADL's : Needs assistance/impaired Eating/Feeding: Bed level;Moderate assistance Eating/Feeding Details (indicate cue type and reason): Per spouse report, typically has adaptive utensils at home Grooming: Standing;Applying deodorant;Maximal assistance Grooming Details (indicate cue type and reason): Hand over hand assist and visual demonstrations needed to cue pt to apply deodorant Upper Body Bathing: Maximal assistance;Sitting;Standing   Lower Body Bathing: Maximal assistance;Sitting/lateral leans   Upper Body Dressing : Sitting;Moderate assistance  Lower Body Dressing: Sitting/lateral leans;Moderate assistance Lower Body Dressing Details (indicate cue type and reason): donned bilat socks with  repetitive cues, needed assist to doff socks. Wife reports she occasionally gets them started Toilet Transfer: +2 for physical assistance;+2 for safety/equipment;Minimal assistance;Ambulation   Toileting- Clothing Manipulation and Hygiene: Sit to/from stand;Moderate assistance       Functional mobility during ADLs: Minimal assistance;+2 for safety/equipment;+2 for physical assistance General ADL Comments: Educated pt spouse on the use of gait belt, provided red foam tubing to build up utenils, may benefit from plate guard but would need formal assessment during feeding     Vision   Additional Comments: Difficult to assess due to impaired cognition, scans L<>R without any noted deficits     Perception         Praxis         Pertinent Vitals/Pain Pain Assessment Pain Assessment: Faces Faces Pain Scale: No hurt     Extremity/Trunk Assessment Upper Extremity Assessment Upper Extremity Assessment: Overall WFL for tasks assessed;Difficult to assess due to impaired cognition (Noted Gross body compensation when using RUE to at sink to manipulate objects/tops)   Lower Extremity Assessment Lower Extremity Assessment: Generalized weakness   Cervical / Trunk Assessment Cervical / Trunk Assessment: Normal   Communication Communication Communication: Impaired Factors Affecting Communication: Difficulty expressing self;Reduced clarity of speech;Hearing impaired   Cognition Arousal: Alert Behavior During Therapy: WFL for tasks assessed/performed Cognition: History of cognitive impairments             OT - Cognition Comments: Hx of dementia, pt also very HOH                 Following commands: Impaired Following commands impaired: Follows one step commands inconsistently, Follows one step commands with increased time     Cueing  General Comments   Cueing Techniques: Verbal cues;Gestural cues;Visual cues      Exercises     Shoulder Instructions      Home  Living Family/patient expects to be discharged to:: Private residence Living Arrangements: Spouse/significant other Available Help at Discharge: Family;Available 24 hours/day Type of Home: House Home Access: Ramped entrance     Home Layout: One level     Bathroom Shower/Tub: Producer, television/film/video: Standard     Home Equipment: Rollator (4 wheels);BSC/3in1;Wheelchair - manual;Hand held shower head (wheelchair in disrepair; wife plans to address through [place])          Prior Functioning/Environment Prior Level of Function : Needs assist  Cognitive Assist : Mobility (cognitive);ADLs (cognitive) Mobility (Cognitive): Intermittent cues         Mobility Comments: walking independently without device inside home; wife pushes him in wheelchair or he walks pushing a cart in the store ADLs Comments: Wife assists with bathing and dressing, gives visual demonstration to help pt engage in ADLs. Can perform simple grooming IE washing his face    OT Problem List: Impaired balance (sitting and/or standing);Decreased cognition;Decreased safety awareness   OT Treatment/Interventions: Self-care/ADL training;Therapeutic exercise;Patient/family education;Therapeutic activities;DME and/or AE instruction;Balance training      OT Goals(Current goals can be found in the care plan section)   Acute Rehab OT Goals Patient Stated Goal: To be able to return home with level of care needed OT Goal Formulation: With family Time For Goal Achievement: 09/20/23 Potential to Achieve Goals: Good ADL Goals Pt Will Perform Eating: with set-up;with adaptive utensils;sitting Pt Will Perform Grooming: sitting;with contact guard assist Pt Will Transfer to Toilet: bedside commode;with  min assist;stand pivot transfer Pt Will Perform Toileting - Clothing Manipulation and hygiene: sit to/from stand;with min assist;with caregiver independent in assisting   OT Frequency:  Min 1X/week    Co-evaluation               AM-PAC OT "6 Clicks" Daily Activity     Outcome Measure Help from another person eating meals?: A Lot Help from another person taking care of personal grooming?: A Lot Help from another person toileting, which includes using toliet, bedpan, or urinal?: A Lot Help from another person bathing (including washing, rinsing, drying)?: A Lot Help from another person to put on and taking off regular upper body clothing?: A Lot Help from another person to put on and taking off regular lower body clothing?: A Lot 6 Click Score: 12   End of Session Equipment Utilized During Treatment: Gait belt Nurse Communication: Mobility status  Activity Tolerance: Patient tolerated treatment well Patient left: in bed;with call bell/phone within reach;with bed alarm set;with family/visitor present  OT Visit Diagnosis: Unsteadiness on feet (R26.81);Other abnormalities of gait and mobility (R26.89);Cognitive communication deficit (R41.841) Symptoms and signs involving cognitive functions: Cerebral infarction (R pontine)                Time: 4098-1191 OT Time Calculation (min): 36 min Charges:  OT General Charges $OT Visit: 1 Visit OT Evaluation $OT Eval Moderate Complexity: 1 Mod OT Treatments $Therapeutic Activity: 8-22 mins  09/06/2023  AB, OTR/L  Acute Rehabilitation Services  Office: [phone]   Tristan Schroeder 09/06/2023, 12:19 PM

## 2023-09-06 NOTE — Progress Notes (Addendum)
 STROKE TEAM PROGRESS NOTE   INTERIM HISTORY/SUBJECTIVE Wife is at the bedside.  Patient is eating lunch MRI brain with right pontine infarct Scheduled to have EGD tomorrow  Wife states he got agitated overnight requiring some Haldol  OBJECTIVE  CBC    Component Value Date/Time   WBC 6.4 09/06/2023 0625   RBC 2.99 (L) 09/06/2023 0625   HGB 9.3 (L) 09/06/2023 0625   HCT 27.1 (L) 09/06/2023 0625   PLT 200 09/06/2023 0625   MCV 90.6 09/06/2023 0625   MCH 31.1 09/06/2023 0625   MCHC 34.3 09/06/2023 0625   RDW 13.2 09/06/2023 0625   LYMPHSABS 1.8 09/06/2023 0625   MONOABS 0.6 09/06/2023 0625   EOSABS 0.2 09/06/2023 0625   BASOSABS 0.0 09/06/2023 0625    BMET    Component Value Date/Time   NA 137 09/06/2023 0625   NA 139 12/30/2020 0000   K 3.8 09/06/2023 0625   CL 105 09/06/2023 0625   CO2 26 09/06/2023 0625   GLUCOSE 142 (H) 09/06/2023 0625   BUN 22 09/06/2023 0625   BUN 19 12/30/2020 0000   CREATININE 1.55 (H) 09/06/2023 0625   CALCIUM 8.5 (L) 09/06/2023 0625   GFRNONAA 43 (L) 09/06/2023 0625    IMAGING past 24 hours ECHOCARDIOGRAM COMPLETE Result Date: 09/05/2023    ECHOCARDIOGRAM REPORT   Patient Name:   MURAD STAPLES Date of Exam: 09/05/2023 Medical Rec #:  [id number]       Height:       64.0 in Accession #:    [id number]      Weight:       151.9 lb Date of Birth:  07-13-1933        BSA:          1.740 m Patient Age:    89 years        BP:           175/66 mmHg Patient Gender: M               HR:           62 bpm. Exam Location:  Inpatient Procedure: 2D Echo, Cardiac Doppler and Color Doppler (Both Spectral and Color            Flow Doppler were utilized during procedure). Indications:    Stroke I63.9  History:        Patient has prior history of Echocardiogram examinations, most                 recent 07/02/2023. Previous Myocardial Infarction and CAD, TIA,                 Arrythmias:Atrial Fibrillation; Risk Factors:Diabetes.  Sonographer:    Lucendia Herrlich RCS  Referring Phys: [id number] Kateri Mc LATIF [facility] IMPRESSIONS  1. Left ventricular ejection fraction, by estimation, is 60 to 65%. The left ventricle has normal function. The left ventricle has no regional wall motion abnormalities. There is mild concentric left ventricular hypertrophy of the basal-septal segment. Left ventricular diastolic parameters are consistent with Grade I diastolic dysfunction (impaired relaxation).  2. Right ventricular systolic function is normal. The right ventricular size is normal.  3. Left atrial size was mildly dilated.  4. The mitral valve is normal in structure. Trivial mitral valve regurgitation. No evidence of mitral stenosis.  5. The aortic valve is moderately calcified. The right and left coronary cusps are fused. The aortic valve is tricuspid. There is mild calcification of the aortic valve. Aortic valve regurgitation  is trivial. Mild aortic valve stenosis. FINDINGS  Left Ventricle: Left ventricular ejection fraction, by estimation, is 60 to 65%. The left ventricle has normal function. The left ventricle has no regional wall motion abnormalities. Strain imaging was not performed. The left ventricular internal cavity  size was normal in size. There is mild concentric left ventricular hypertrophy of the basal-septal segment. Left ventricular diastolic parameters are consistent with Grade I diastolic dysfunction (impaired relaxation). Right Ventricle: The right ventricular size is normal. No increase in right ventricular wall thickness. Right ventricular systolic function is normal. Left Atrium: Left atrial size was mildly dilated. Right Atrium: Right atrial size was normal in size. Pericardium: There is no evidence of pericardial effusion. Mitral Valve: The mitral valve is normal in structure. Mild mitral annular calcification. Trivial mitral valve regurgitation. No evidence of mitral valve stenosis. Tricuspid Valve: The tricuspid valve is normal in structure. Tricuspid valve  regurgitation is trivial. No evidence of tricuspid stenosis. Aortic Valve: The aortic valve is moderately calcified. The right and left coronary cusps are fused. The aortic valve is tricuspid. There is mild calcification of the aortic valve. Aortic valve regurgitation is trivial. Mild aortic stenosis is present. Aortic valve mean gradient measures 5.0 mmHg. Aortic valve peak gradient measures 10.0 mmHg. Aortic valve area, by VTI measures 1.50 cm. Pulmonic Valve: The pulmonic valve was normal in structure. Pulmonic valve regurgitation is trivial. No evidence of pulmonic stenosis. Aorta: The aortic root is normal in size and structure. Venous: The inferior vena cava was not well visualized. IAS/Shunts: No atrial level shunt detected by color flow Doppler. Additional Comments: 3D imaging was not performed.  LEFT VENTRICLE PLAX 2D LVIDd:         3.30 cm     Diastology LVIDs:         2.60 cm     LV e' medial:    4.57 cm/s LV PW:         0.90 cm     LV E/e' medial:  10.2 LV IVS:        1.20 cm     LV e' lateral:   5.98 cm/s LVOT diam:     2.10 cm     LV E/e' lateral: 7.8 LV SV:         50 LV SV Index:   29 LVOT Area:     3.46 cm  LV Volumes (MOD) LV vol d, MOD A2C: 80.8 ml LV vol d, MOD A4C: 64.8 ml LV vol s, MOD A2C: 41.9 ml LV vol s, MOD A4C: 35.9 ml LV SV MOD A2C:     38.9 ml LV SV MOD A4C:     64.8 ml LV SV MOD BP:      36.1 ml RIGHT VENTRICLE RV S prime:     14.30 cm/s TAPSE (M-mode): 1.0 cm LEFT ATRIUM             Index        RIGHT ATRIUM          Index LA diam:        3.90 cm 2.24 cm/m   RA Area:     8.47 cm LA Vol (A2C):   51.5 ml 29.59 ml/m  RA Volume:   12.70 ml 7.30 ml/m LA Vol (A4C):   48.4 ml 27.81 ml/m LA Biplane Vol: 49.2 ml 28.27 ml/m  AORTIC VALVE AV Area (Vmax):    1.63 cm AV Area (Vmean):   1.45 cm AV Area (VTI):  1.50 cm AV Vmax:           158.00 cm/s AV Vmean:          108.000 cm/s AV VTI:            0.333 m AV Peak Grad:      10.0 mmHg AV Mean Grad:      5.0 mmHg LVOT Vmax:          74.15 cm/s LVOT Vmean:        45.200 cm/s LVOT VTI:          0.144 m LVOT/AV VTI ratio: 0.43  AORTA Ao Root diam: 3.60 cm Ao Asc diam:  3.50 cm MITRAL VALVE               TRICUSPID VALVE MV Area (PHT): 2.20 cm    TR Peak grad:   18.5 mmHg MV Decel Time: 345 msec    TR Vmax:        215.00 cm/s MV E velocity: 46.60 cm/s MV A velocity: 95.40 cm/s  SHUNTS MV E/A ratio:  0.49        Systemic VTI:  0.14 m                            Systemic Diam: 2.10 cm Arvilla Meres MD Electronically signed by Arvilla Meres MD Signature Date/Time: 09/05/2023/2:08:26 PM    Final     Vitals:   09/05/23 1953 09/06/23 0020 09/06/23 0450 09/06/23 0808  BP: (!) 134/58 (!) 133/49 (!) 162/69 137/67  Pulse: (!) 59 78 70 (!) 59  Resp: 17  18 17   Temp: 98.6 F (37 C)   98.5 F (36.9 C)  TempSrc: Oral   Oral  SpO2: 100%  100% 100%  Weight:      Height:         PHYSICAL EXAM General:  Alert, well-nourished, well-developed patient in no acute distress Psych:  Mood and affect appropriate for situation CV: Regular rate and rhythm on monitor Respiratory:  Regular, unlabored respirations on room air GI: Abdomen soft and nontender   NEURO:  Mental Status: Awake alert and oriented to self.  Which per wife is his baseline.  He can follow simple commands intermittently.  Poor attention Speech/Language: Aphasia and mild dysarthria.  Wife states that his baseline and usually cannot name objects  Cranial Nerves:  II: PERRL. Visual fields full to threat III, IV, VI: EOMI tracks around the room. Eyelids elevate symmetrically.  V: Sensation is intact to light touch and symmetrical to face.  VII: Face is symmetrical resting and smiling VIII: hearing intact to voice. IX, X: Palate elevates symmetrically. Phonation is normal.  NU:[id number] shrug 5/5. XII: tongue is midline without fasciculations. Motor: Left upper, bilateral lowers 5/5.  Right upper seems to be a little bit weaker 4/5 Tone: is normal and bulk is  normal Sensation- Intact to light touch bilaterally. Extinction absent to light touch to DSS.   Coordination: He seems to be a little ataxic while eating his lunch with his right hand.  Not able to fully assess due to him not following commands HKS: no ataxia in BLE.No drift.  Gait- deferred  Most Recent NIH  1a Level of Conscious.: 0 1b LOC Questions: 2 1c LOC Commands: 1 2 Best Gaze: 0 3 Visual: 0 4 Facial Palsy: 0 5a Motor Arm - left: 0 5b Motor Arm - Right: 1 6a Motor Leg - Left: 0 6b  Motor Leg - Right: 0 7 Limb Ataxia: 1 8 Sensory: 0 9 Best Language: 2 10 Dysarthria: 1 11 Extinct. and Inatten.: 0 TOTAL: 8   ASSESSMENT/PLAN  Mr. MYREON WIMER is a 89 y.o. male with history of  atrial fibrillation on Eliquis, diabetes, hypertension, hyperlipidemia, heart failure, CKD, CAD, Alzheimer's who presents with increasing difficulty walking and some right arm dysfunction he was also noted to have a GI bleed NIH on Admission 7  Acute Ischemic Infarct:  right pontine infarct  Etiology: Likely small vessel disease CT head No acute abnormality. Small vessel disease. Atrophy.    MRI   16 mm acute infarct within the right aspect of the pons. Mild-to-moderate chronic small vessel ischemic change.  Chronic left thalamic infarct.  Moderate to advanced cerebral atrophy MRA pending Carotid Doppler ordered 2D Echo EF 60 to 65%.  Mild LVH with grade 1 diastolic dysfunction.  Left atrium mildly dilated LDL 82 HgbA1c 5.9 VTE prophylaxis -SCDs Eliquis and 81 mg aspirin prior to admission, now on No antithrombotic due to acute GI bleed Therapy recommendations:  Pending Disposition: Pending  Hx of Stroke/TIA Chronic left thalamic infarct on imaging  Atrial fibrillation Home Meds: Eliquis, amiodarone 200 mg, Continue telemetry monitoring Hold anticoagulation due to acute GI bleed.   Hypertension Heart failure Home meds: Amlodipine 2.5 mg, losartan 100 mg Stable Blood Pressure Goal: BP  less than 220/110   Hyperlipidemia Home meds: Atorvastatin 40 mg LDL 82, goal < 70 Increase atorvastatin to 80 mg Continue statin at discharge  Dysphagia Patient has post-stroke dysphagia, SLP consulted    Diet   DIET DYS 3 Room service appropriate? Yes; Fluid consistency: Thin   Advance diet as tolerated  Dementia Continue home Namenda  Possible upper GI bleed Acute blood loss anemia GI is being consulted Scheduled for EGD tomorrow Holding Eliquis at this time  Other Stroke Risk Factors Coronary artery disease  Other Active Problems Cute kidney injury on CKD  Hospital day # 2  ATTENDING ATTESTATION:  89 year old with a pontine stroke that is relatively small.  History of atrial fibrillation.  At one point took Eliquis but stopped this a while ago takes aspirin on a daily basis at home.  He has black tarry stools with low hemoglobin requiring transfusion on Monday.  Aspirin has since been stopped.  Concern for upper GI bleed.  Discussed case with GI physician in the room with the patient.  His wife who is to work on this for initially was hesitant to do an EGD upon further discussion she agreed later to get it done as this will help [place] assess whether he can have antiplatelet or anticoagulation therapy.  He has refused colonoscopies previously and has never had a colonoscopy completed.  Carotid Dopplers shows less than 40% stenosis bilaterally however appears to be unstable plaque on the left proximal carotid.  High intensity statin recommended.  MRA pending  Palliative care is also seeing this patient.  Further discussion on stroke prevention medication after EGD is completed tomorrow.  Dr. Viviann Spare evaluated pt independently, reviewed imaging, chart, labs. Discussed and formulated plan with the Resident/APP. Changes were made to the note where appropriate. Please see APP/resident note above for details.     MDM: High.   MDM: High. Pertinent labs, imaging results  reviewed by me and considered in my decision making. Independently reviewed imaging. Medical records reviewed. Discussed the patient with another medical provider/personnel. Obtained history from someone other than the patient.  Nainoa Woldt,MD    To contact Stroke Continuity provider, please refer to [email or web address]. After hours, contact General Neurology

## 2023-09-06 NOTE — Consult Note (Incomplete)
 Palliative Care Consult Note                                  Date: 09/06/2023   Patient Name: Alexander Donaldson  DOB: 21-Oct-1933  MRN: [id number]  Age / Sex: 89 y.o., male  PCP: [facility] Referring Physician: Rhetta Mura, MD  Reason for Consultation: Establishing goals of care  HPI/Patient Profile: 89 y.o. male  with past medical history of dementia, atrial fibrillation on Eliquis, CAD with prior MI, diabetes type 2, spinal stenosis and sciatica, HTN, and HLD.  He presented to the ED on 09/03/2023 with facial droop, slurred speech, difficulty walking, and right arm dysfunction.  CT head showed acute right pontine CVA.  Past Medical History:  Diagnosis Date   Acute encephalopathy 02/12/2018   Alzheimer's dementia (HCC)    CAD (coronary artery disease)    CAP (community acquired pneumonia) 02/11/2018   Carpal tunnel syndrome, bilateral 02/25/2020   Coronary artery disease    Diabetes mellitus    Diverticulitis    Hearing loss    Hypertension    Hypokalemia 02/12/2018   NSTEMI (non-ST elevated myocardial infarction) [facility])     Subjective:   Extensive chart review has been completed prior to meeting with patient/family including labs, vital signs, imaging, progress/consult notes, orders, medications and available advance directive documents.   I met with wife to discuss diagnosis, prognosis, and GOC.  I introduced Palliative Medicine as specialized medical care for people living with serious illness. It focuses on providing relief from the symptoms and stress of a serious illness.     Created space and opportunity for patient and family to explore thoughts and feelings regarding current medical situation. Values and goals of care were attempted to be elicited.  Life Review: ***  Functional Status: ***  GOC Discussion: We discussed patient's current illness and what it means in the larger context of  his/her ongoing co-morbidities. Current clinical status was reviewed. Natural disease trajectory of *** was discussed.  A discussion was had today regarding advanced directives. Concepts specific to code status, artifical feeding and hydration, continued IV antibiotics and rehospitalization was had.  The MOST form was introduced and discussed.  Questions and concerns addressed. Patient/family encouraged to call with questions or concerns.    Review of Systems  Objective:   Primary Diagnoses: Present on Admission:  Acute upper GI bleed  Acute blood loss anemia  Facial droop  Weakness of right upper extremity  AKI (acute kidney injury) (HCC)  Paroxysmal atrial fibrillation (HCC)  Essential hypertension  Chronic combined systolic and diastolic heart failure (HCC)  TIA (transient ischemic attack)   Physical Exam  Vital Signs:  BP (!) 155/87 (BP Location: Right Arm)   Pulse 76   Temp 98.1 F (36.7 C) (Oral)   Resp 17   Ht 5\' 4"  (1.626 m)   Wt 68.9 kg   SpO2 100%   BMI 26.07 kg/m   Palliative Assessment/Data: ***     Assessment & Plan:   SUMMARY OF RECOMMENDATIONS   ***  Primary Decision Maker: NEXT OF KIN - wife/Karen  Code Status/Advance Care Planning: DNR - Limited  Symptom Management:  ***  Prognosis:  {Palliative Care Prognosis:23504}  Discharge Planning:  {Palliative dispostion:23505}   Discussed with: ***    Thank you for allowing [place] to participate in the care of [name]   Time Total: ***  Greater  than 50%  of this time was spent counseling and coordinating care related to the above assessment and plan.  Signed by: Sherlean Foot, NP Palliative Medicine Team  Team Phone # [phone]  For individual providers, please see AMION

## 2023-09-06 NOTE — Progress Notes (Addendum)
 Patient ID: Alexander Donaldson, male   DOB: 30-Dec-1933, 89 y.o.   MRN: [id number]    Progress Note   Subjective   Day # 3 CC; acute CVA, dark heme positive stool on admission with drop in hemoglobin of about 4 g over the past few months  Patient had significant agitation overnight, kicking etc., had to use Haldol. Wife was at bedside through the night Patient has just left for MRI angio of head to assure no extension of CVA.  Patient's wife says that she spoke with patient's children last evening, and they all agree that they do not want to put him through sedation/EGD, and will focus more on quality of life.  WBC 6.4/hemoglobin 9.3/hematocrit 27.1 stable since transfusions BUN 22/creatinine 1.55   Objective   Vital signs in last 24 hours: Temp:  [97.7 F (36.5 C)-98.6 F (37 C)] 98.5 F (36.9 C) (02/26 0808) Pulse Rate:  [58-78] 59 (02/26 0808) Resp:  [17-18] 17 (02/26 0808) BP: (122-162)/(49-76) 137/67 (02/26 0808) SpO2:  [100 %] 100 % (02/26 0450) Last BM Date : 09/04/23  Intake/Output from previous day: 02/25 0701 - 02/26 0700 In: -  Out: 520 [Urine:520] Intake/Output this shift: No intake/output data recorded.  Lab Results: Recent Labs    09/04/23 1133 09/04/23 1646 09/05/23 0549 09/06/23 0625  WBC 4.1  --  6.0 6.4  HGB 7.9* 9.9* 9.5* 9.3*  HCT 23.8* 28.1* 26.4* 27.1*  PLT 170  --  194 200   BMET Recent Labs    09/04/23 1133 09/05/23 0549 09/06/23 0625  NA 138 136 137  K 3.4* 4.0 3.8  CL 107 108 105  CO2 25 22 26   GLUCOSE 124* 109* 142*  BUN 23 18 22   CREATININE 1.27* 1.09 1.55*  CALCIUM 8.2* 8.0* 8.5*   LFT Recent Labs    09/06/23 0625  PROT 6.1*  ALBUMIN 3.2*  AST 17  ALT 14  ALKPHOS 51  BILITOT 1.0   PT/INR Recent Labs    09/03/23 1848  LABPROT 19.9*  INR 1.7*    Studies/Results: ECHOCARDIOGRAM COMPLETE Result Date: 09/05/2023    ECHOCARDIOGRAM REPORT   Patient Name:   Alexander Donaldson Date of Exam: 09/05/2023 Medical Rec #:   [id number]       Height:       64.0 in Accession #:    [id number]      Weight:       151.9 lb Date of Birth:  [birth year]        BSA:          1.740 m Patient Age:    89 years        BP:           175/66 mmHg Patient Gender: M               HR:           62 bpm. Exam Location:  Inpatient Procedure: 2D Echo, Cardiac Doppler and Color Doppler (Both Spectral and Color            Flow Doppler were utilized during procedure). Indications:    Stroke I63.9  History:        Patient has prior history of Echocardiogram examinations, most                 recent 07/02/2023. Previous Myocardial Infarction and CAD, TIA,                 Arrythmias:Atrial Fibrillation; Risk  Factors:Diabetes.  Sonographer:    Lucendia Herrlich RCS Referring Phys: [id number] Kateri Mc LATIF [facility] IMPRESSIONS  1. Left ventricular ejection fraction, by estimation, is 60 to 65%. The left ventricle has normal function. The left ventricle has no regional wall motion abnormalities. There is mild concentric left ventricular hypertrophy of the basal-septal segment. Left ventricular diastolic parameters are consistent with Grade I diastolic dysfunction (impaired relaxation).  2. Right ventricular systolic function is normal. The right ventricular size is normal.  3. Left atrial size was mildly dilated.  4. The mitral valve is normal in structure. Trivial mitral valve regurgitation. No evidence of mitral stenosis.  5. The aortic valve is moderately calcified. The right and left coronary cusps are fused. The aortic valve is tricuspid. There is mild calcification of the aortic valve. Aortic valve regurgitation is trivial. Mild aortic valve stenosis. FINDINGS  Left Ventricle: Left ventricular ejection fraction, by estimation, is 60 to 65%. The left ventricle has normal function. The left ventricle has no regional wall motion abnormalities. Strain imaging was not performed. The left ventricular internal cavity  size was normal in size. There is mild concentric left  ventricular hypertrophy of the basal-septal segment. Left ventricular diastolic parameters are consistent with Grade I diastolic dysfunction (impaired relaxation). Right Ventricle: The right ventricular size is normal. No increase in right ventricular wall thickness. Right ventricular systolic function is normal. Left Atrium: Left atrial size was mildly dilated. Right Atrium: Right atrial size was normal in size. Pericardium: There is no evidence of pericardial effusion. Mitral Valve: The mitral valve is normal in structure. Mild mitral annular calcification. Trivial mitral valve regurgitation. No evidence of mitral valve stenosis. Tricuspid Valve: The tricuspid valve is normal in structure. Tricuspid valve regurgitation is trivial. No evidence of tricuspid stenosis. Aortic Valve: The aortic valve is moderately calcified. The right and left coronary cusps are fused. The aortic valve is tricuspid. There is mild calcification of the aortic valve. Aortic valve regurgitation is trivial. Mild aortic stenosis is present. Aortic valve mean gradient measures 5.0 mmHg. Aortic valve peak gradient measures 10.0 mmHg. Aortic valve area, by VTI measures 1.50 cm. Pulmonic Valve: The pulmonic valve was normal in structure. Pulmonic valve regurgitation is trivial. No evidence of pulmonic stenosis. Aorta: The aortic root is normal in size and structure. Venous: The inferior vena cava was not well visualized. IAS/Shunts: No atrial level shunt detected by color flow Doppler. Additional Comments: 3D imaging was not performed.  LEFT VENTRICLE PLAX 2D LVIDd:         3.30 cm     Diastology LVIDs:         2.60 cm     LV e' medial:    4.57 cm/s LV PW:         0.90 cm     LV E/e' medial:  10.2 LV IVS:        1.20 cm     LV e' lateral:   5.98 cm/s LVOT diam:     2.10 cm     LV E/e' lateral: 7.8 LV SV:         50 LV SV Index:   29 LVOT Area:     3.46 cm  LV Volumes (MOD) LV vol d, MOD A2C: 80.8 ml LV vol d, MOD A4C: 64.8 ml LV vol s, MOD  A2C: 41.9 ml LV vol s, MOD A4C: 35.9 ml LV SV MOD A2C:     38.9 ml LV SV MOD A4C:     64.8 ml  LV SV MOD BP:      36.1 ml RIGHT VENTRICLE RV S prime:     14.30 cm/s TAPSE (M-mode): 1.0 cm LEFT ATRIUM             Index        RIGHT ATRIUM          Index LA diam:        3.90 cm 2.24 cm/m   RA Area:     8.47 cm LA Vol (A2C):   51.5 ml 29.59 ml/m  RA Volume:   12.70 ml 7.30 ml/m LA Vol (A4C):   48.4 ml 27.81 ml/m LA Biplane Vol: 49.2 ml 28.27 ml/m  AORTIC VALVE AV Area (Vmax):    1.63 cm AV Area (Vmean):   1.45 cm AV Area (VTI):     1.50 cm AV Vmax:           158.00 cm/s AV Vmean:          108.000 cm/s AV VTI:            0.333 m AV Peak Grad:      10.0 mmHg AV Mean Grad:      5.0 mmHg LVOT Vmax:         74.15 cm/s LVOT Vmean:        45.200 cm/s LVOT VTI:          0.144 m LVOT/AV VTI ratio: 0.43  AORTA Ao Root diam: 3.60 cm Ao Asc diam:  3.50 cm MITRAL VALVE               TRICUSPID VALVE MV Area (PHT): 2.20 cm    TR Peak grad:   18.5 mmHg MV Decel Time: 345 msec    TR Vmax:        215.00 cm/s MV E velocity: 46.60 cm/s MV A velocity: 95.40 cm/s  SHUNTS MV E/A ratio:  0.49        Systemic VTI:  0.14 m                            Systemic Diam: 2.10 cm Arvilla Meres MD Electronically signed by Arvilla Meres MD Signature Date/Time: 09/05/2023/2:08:26 PM    Final        Assessment / Plan:    #63 89 year old African-American male with advanced Alzheimer's disease, admitted with weakness, inability to ambulate, slurred speech and right facial droop and diagnosed with an acute CVA involving the right pons Occurred in the setting of chronic Eliquis  Habits change in mental history in the night with agitation, requiring Haldol  Undergoing MRI angio of the brain currently  #2 significant normocytic anemia and heme positive stool on admission with 4 g drop in hemoglobin over the past 2-1/2 months. Suspect slow GI blood loss, wife had not been aware of any bleeding or melena. Small aspirin induced ulcer  disease, gastropathy AVMs, occult neoplasm.  Required transfusions on admission but has been stable since without any further evidence of active GI bleeding. Eliquis has been on hold  #3 diabetes mellitus 4.  Coronary artery disease status post prior MI and multiple stents 5.  Atrial fibrillation 6.  Congestive heart failure  Plan; okay to convert to twice daily oral PPI, x 6 weeks then once daily thereafter Patient's wife and family have decided not to proceed with any procedures requiring sedation, Will cancel EGD Reorder previous diet, dysphagia 3  Will defer decisions regarding leaving off  Eliquis versus considering resuming at some point per primary service. GI will sign off, available if needed    Principal Problem:   Acute upper GI bleed Active Problems:   DM2 (diabetes mellitus, type 2) (HCC)   Essential hypertension   TIA (transient ischemic attack)   Acute blood loss anemia   Facial droop   Weakness of right upper extremity   AKI (acute kidney injury) (HCC)   Paroxysmal atrial fibrillation (HCC)   Chronic combined systolic and diastolic heart failure (HCC)     LOS: 2 days   Amy Esterwood PA-C 09/06/2023, 10:29 AM  I have taken an interval history, thoroughly reviewed the chart and examined the patient. I agree with the Advanced Practitioner's note, impression and recommendations, and have recorded additional findings, impressions and recommendations below. I performed a substantive portion of this encounter (>50% time spent), including a complete performance of the medical decision making.  My additional thoughts are as follows:  Neurology was speaking with this patient's wife at the time of my evaluation.  They were discussing the decision making related to antiplatelet and/or anticoagulant medicines in the setting of this anemia of unclear cause.  Mr. Pirro wife has reconsidered and thinks she may want him to have the upper endoscopy and she would like to  discuss it again with her children later today.  I think an upper endoscopy is reasonable, and we will be available if they decide to do so.  I still contend that if that exam is unrevealing, that he is in poor condition to prep for and undergo a colonoscopy. Naturally, I do not know whether we will find an answer to the anemia on his EGD.  However, if we do not find anything that would seem to pose high risk of significant GI bleeding with ongoing use of antiplatelet or anticoagulant agent, then perhaps that will help them make a more informed decision.  His wife will have nursing let us know their decision 1 where the other and we can proceed accordingly.   Charlie Pitter III [phone]

## 2023-09-06 NOTE — Progress Notes (Signed)
 [facility]  NOTE RUBENS CRANSTON KVQ:[id number]  DOB: 1934-05-12  DOA: 09/03/2023  PCP: [facility]  09/06/2023,7:12 AM  LOS: 2 days   Code Status: DNR from: Home current Dispo: Unclear   89 year old black male-at baseline needs help with all ADLs-unable to fix himself a plate at meals and needs assistance getting to the restroom Known atrial fibrillation CHADVASC >5 on Eliquis follows with Dr. Sharyn Lull Recent hospitalization 12/20 through through 12/24 2024 with small non-Q wave MI-on aspirin Prior CAD with silent MI status post PTCA 2003 Remote tobacco DM TY 2 History of underlying spinal stenosis and sciatica and left foot drop HTN HLD Dementia with sundowning  2/23 MCH from home with facial droop slurred speech bilateral weakness with shuffling gait and difficulty using right arm-wife called 911 CT head showed acute right pontine CVA While in ED documented dark tarry stool-hemoglobin dropped 4 points since several months from baseline of 12.8 Hemoglobin 8.1 WBC 5.5 platelet 211, sodium 137 potassium 4.1 BUN/creatinine 34/1.7 LFTs relatively normal BNP 174 2/24  GI consulted-note no prior GI workup-UGI contemplated  Plan Iron 41 saturation 14 ferritin 9  Acute pontine CVA Poor candidate without decision making/evaluation of GI tract for antiplatelet/DOAC therapy-GI/neurology discussing risk benefits alternatives of antiplatelets, scope etc. and family deciding the same Secondary prevention with atorvastatin 80 as well as blood pressure control  Possible upper GI bleed Await discussion with wife to decide about upper endoscopy as per GI note Continue Protonix 40 IV twice daily for now  Acute blood loss anemia Transfused 1 unit PRBC on arrival 2/2 bleeding-iron 41, saturation ratios 14 Will give a dose of IV iron and reassess with labs Watch for bleeding  Chronic systolic diastolic HF Resumed amlodipine at dose 5, ---losartan 100 on hold Seems euvolemic and was  not on PTA Lasix Echo this admission EF 60-65% grade 1 diastolic dysfunction normal valves  Paroxysmal A-fib CHADVASC >4 Continue amiodarone 200 daily, holding apixaban 5 twice daily for now until decision about anticoagulation per GI/neurology  AKI superimposed on CKD 3B-baseline creatinine between 1.3 and 1.5 Meds being adjusted as above-follow labs periodically Would hold going forward losartan as not a good candidate to use this going  DM TY 2 A1c 5.9-not on PTA meds CBGs ranging in the low 180 predominantly-if continues to be below this level will discontinue coverage May be able to go to [facility]als if felt indicated  Dementia Mild-continue Namenda 10 twice daily and gently reorient-this has been going on since 2020 and wife wishes to take him home with her   DVT prophylaxis: SCD  Status is: Inpatient Remains inpatient appropriate because:   Requires scope      Subjective: Awake but confused and cannot re-orient to any specific degree cannot tell me where he is Wife aids him significantly at home and manages him at home  Objective + exam Vitals:   09/05/23 1559 09/05/23 1953 09/06/23 0020 09/06/23 0450  BP: 122/76 (!) 134/58 (!) 133/49 (!) 162/69  Pulse: (!) 58 (!) 59 78 70  Resp: 17 17  18   Temp: 98.1 F (36.7 C) 98.6 F (37 C)    TempSrc: Oral Oral    SpO2: 100% 100%  100%  Weight:      Height:       Filed Weights   09/04/23 1615 09/05/23 0622  Weight: 67.2 kg 68.9 kg    Examination:  Alert but not oriented quite confused CTAB no wheeze rales rhonchi ROM intact grossly but  does not follow commands consistently Reflexes 2/3 smile symmetric Abdomen soft no rebound Does not understand well enough to test for cerebellar signs  Data Reviewed: reviewed   CBC    Component Value Date/Time   WBC 6.4 09/06/2023 0625   RBC 2.99 (L) 09/06/2023 0625   HGB 9.3 (L) 09/06/2023 0625   HCT 27.1 (L) 09/06/2023 0625   PLT 200 09/06/2023 0625   MCV 90.6 09/06/2023  0625   MCH 31.1 09/06/2023 0625   MCHC 34.3 09/06/2023 0625   RDW 13.2 09/06/2023 0625   LYMPHSABS 1.8 09/06/2023 0625   MONOABS 0.6 09/06/2023 0625   EOSABS 0.2 09/06/2023 0625   BASOSABS 0.0 09/06/2023 0625      Latest Ref Rng & Units 09/06/2023    6:25 AM 09/05/2023    5:49 AM 09/04/2023   11:33 AM  CMP  Glucose 70 - 99 mg/dL 161  096  045   BUN 8 - 23 mg/dL 22  18  23    Creatinine 0.61 - 1.24 mg/dL 4.09  8.11  9.14   Sodium 135 - 145 mmol/L 137  136  138   Potassium 3.5 - 5.1 mmol/L 3.8  4.0  3.4   Chloride 98 - 111 mmol/L 105  108  107   CO2 22 - 32 mmol/L 26  22  25    Calcium 8.9 - 10.3 mg/dL 8.5  8.0  8.2   Total Protein 6.5 - 8.1 g/dL 6.1   5.3   Total Bilirubin 0.0 - 1.2 mg/dL 1.0   1.1   Alkaline Phos 38 - 126 U/L 51   42   AST 15 - 41 U/L 17   16   ALT 0 - 44 U/L 14   14     Scheduled Meds:  amiodarone  200 mg Oral Daily   atorvastatin  80 mg Oral Daily   haloperidol lactate       insulin aspart  0-6 Units Subcutaneous Q6H   memantine  10 mg Oral BID   multivitamin with minerals  1 tablet Oral Daily   pantoprazole (PROTONIX) IV  40 mg Intravenous Q12H   Continuous Infusions:  Time 46  Rhetta Mura, MD  [facility]

## 2023-09-07 DIAGNOSIS — D62 Acute posthemorrhagic anemia: Secondary | ICD-10-CM | POA: Diagnosis not present

## 2023-09-07 DIAGNOSIS — I6350 Cerebral infarction due to unspecified occlusion or stenosis of unspecified cerebral artery: Secondary | ICD-10-CM

## 2023-09-07 DIAGNOSIS — Z515 Encounter for palliative care: Secondary | ICD-10-CM | POA: Diagnosis not present

## 2023-09-07 DIAGNOSIS — Z7189 Other specified counseling: Secondary | ICD-10-CM | POA: Diagnosis not present

## 2023-09-07 DIAGNOSIS — R195 Other fecal abnormalities: Secondary | ICD-10-CM

## 2023-09-07 DIAGNOSIS — I5042 Chronic combined systolic (congestive) and diastolic (congestive) heart failure: Secondary | ICD-10-CM | POA: Diagnosis not present

## 2023-09-07 DIAGNOSIS — K922 Gastrointestinal hemorrhage, unspecified: Secondary | ICD-10-CM | POA: Diagnosis not present

## 2023-09-07 DIAGNOSIS — D500 Iron deficiency anemia secondary to blood loss (chronic): Secondary | ICD-10-CM

## 2023-09-07 LAB — CBC WITH DIFFERENTIAL/PLATELET
Abs Immature Granulocytes: 0.01 10*3/uL (ref 0.00–0.07)
Basophils Absolute: 0 10*3/uL (ref 0.0–0.1)
Basophils Relative: 1 %
Eosinophils Absolute: 0.2 10*3/uL (ref 0.0–0.5)
Eosinophils Relative: 3 %
HCT: 28.5 % — ABNORMAL LOW (ref 39.0–52.0)
Hemoglobin: 9.6 g/dL — ABNORMAL LOW (ref 13.0–17.0)
Immature Granulocytes: 0 %
Lymphocytes Relative: 32 %
Lymphs Abs: 2.1 10*3/uL (ref 0.7–4.0)
MCH: 31.1 pg (ref 26.0–34.0)
MCHC: 33.7 g/dL (ref 30.0–36.0)
MCV: 92.2 fL (ref 80.0–100.0)
Monocytes Absolute: 0.7 10*3/uL (ref 0.1–1.0)
Monocytes Relative: 11 %
Neutro Abs: 3.6 10*3/uL (ref 1.7–7.7)
Neutrophils Relative %: 53 %
Platelets: 219 10*3/uL (ref 150–400)
RBC: 3.09 MIL/uL — ABNORMAL LOW (ref 4.22–5.81)
RDW: 13.4 % (ref 11.5–15.5)
WBC: 6.6 10*3/uL (ref 4.0–10.5)
nRBC: 0 % (ref 0.0–0.2)

## 2023-09-07 LAB — BASIC METABOLIC PANEL
Anion gap: 7 (ref 5–15)
BUN: 23 mg/dL (ref 8–23)
CO2: 22 mmol/L (ref 22–32)
Calcium: 8.6 mg/dL — ABNORMAL LOW (ref 8.9–10.3)
Chloride: 107 mmol/L (ref 98–111)
Creatinine, Ser: 1.68 mg/dL — ABNORMAL HIGH (ref 0.61–1.24)
GFR, Estimated: 39 mL/min — ABNORMAL LOW (ref >60)
Glucose, Bld: 124 mg/dL — ABNORMAL HIGH (ref 70–99)
Potassium: 4.3 mmol/L (ref 3.5–5.1)
Sodium: 136 mmol/L (ref 135–145)

## 2023-09-07 LAB — GLUCOSE, CAPILLARY
Glucose-Capillary: 132 mg/dL — ABNORMAL HIGH (ref 70–99)
Glucose-Capillary: 166 mg/dL — ABNORMAL HIGH (ref 70–99)

## 2023-09-07 MED ORDER — IRON SUCROSE 200 MG IVPB - SIMPLE MED: 200.0000 mg | Status: DC

## 2023-09-07 MED ORDER — IRON SUCROSE 200 MG IVPB - SIMPLE MED
200.0000 mg | Status: DC
  Filled 2023-09-07: qty 110

## 2023-09-07 MED ORDER — SODIUM CHLORIDE 0.9 % IV SOLN
200.0000 mg | INTRAVENOUS | Status: AC
  Administered 2023-09-07: 200 mg via INTRAVENOUS
  Filled 2023-09-07: qty 10

## 2023-09-07 NOTE — Progress Notes (Signed)
 Physical Therapy Treatment Patient Details Name: Alexander Donaldson MRN: [id number] DOB: 1934/07/07 Today's Date: 09/07/2023   History of Present Illness 89 y.o. male presents 09/04/23 with increasing difficulty walking and some right arm dysfunction. Admitted with suspected acute upper GIB complicated by acute blood loss anemia. MRI shows acute R pontine CVA. PMH atrial fibrillation on Eliquis, diabetes, hypertension, Alzheimer's, coronary disease    PT Comments  Pt resting in bed and agreeable to session with continued progress towards acute goals. Pt able to progress gait with rollator support and grossly CGA for safety, increasing to light min A with fatigue last ~40' of gait distance. Pt needing cues for closer rollator proximity and upright trunk throughout ambulation with pt demonstrating poor correction however pt without LOB throughout. Pt continues to be limited in safe mobility by impaired cognition at baseline and impaired balance/postural reactions. Pt continues to benefit from skilled PT services to progress toward functional mobility goals.     If plan is discharge home, recommend the following: A little help with walking and/or transfers;Direct supervision/assist for medications management;Direct supervision/assist for financial management;Supervision due to cognitive status;Help with stairs or ramp for entrance   Can travel by private vehicle        Equipment Recommendations  None recommended by PT    Recommendations for Other Services       Precautions / Restrictions Precautions Precautions: Fall Restrictions Weight Bearing Restrictions Per Provider Order: No     Mobility  Bed Mobility Overal bed mobility: Needs Assistance Bed Mobility: Supine to Sit, Sit to Supine     Supine to sit: Contact guard     General bed mobility comments: CGA for safety with increased cues for attention to task and sequencing    Transfers Overall transfer level: Needs  assistance Equipment used: Rollator (4 wheels) Transfers: Sit to/from Stand Sit to Stand: Min assist           General transfer comment: light min A to boost to stand and steady on rise, completing x2 during session    Ambulation/Gait Ambulation/Gait assistance: Min assist, Contact guard assist Gait Distance (Feet): 300 Feet Assistive device: Rollator (4 wheels) Gait Pattern/deviations: Step-through pattern, Decreased stride length, Shuffle Gait velocity: decr     General Gait Details: cues for closer proximity to rollator as pt with flexed posture, min A to steady with fatigue as pt with low RLE clearance   Stairs             Wheelchair Mobility     Tilt Bed    Modified Rankin (Stroke Patients Only) Modified Rankin (Stroke Patients Only) Pre-Morbid Rankin Score: Moderately severe disability Modified Rankin: Moderately severe disability     Balance Overall balance assessment: Needs assistance Sitting-balance support: Feet supported, No upper extremity supported Sitting balance-Leahy Scale: Good Sitting balance - Comments: donning bilat socks with good control of balance   Standing balance support: Bilateral upper extremity supported, During functional activity Standing balance-Leahy Scale: Poor Standing balance comment: reliant on UE supprot                            Communication Communication Communication: Impaired Factors Affecting Communication: Difficulty expressing self;Reduced clarity of speech;Hearing impaired  Cognition Arousal: Alert Behavior During Therapy: WFL for tasks assessed/performed   PT - Cognitive impairments: History of cognitive impairments  Following commands: Impaired Following commands impaired: Follows one step commands inconsistently, Follows one step commands with increased time    Cueing Cueing Techniques: Verbal cues, Gestural cues, Visual cues  Exercises      General  Comments General comments (skin integrity, edema, etc.): wife present and supportive      Pertinent Vitals/Pain Pain Assessment Pain Assessment: Faces Faces Pain Scale: No hurt Pain Intervention(s): Monitored during session    Home Living                          Prior Function            PT Goals (current goals can now be found in the care plan section) Acute Rehab PT Goals Patient Stated Goal: none stated PT Goal Formulation: With patient/family Time For Goal Achievement: 09/19/23 Progress towards PT goals: Progressing toward goals    Frequency    Min 1X/week      PT Plan      Co-evaluation              AM-PAC PT "6 Clicks" Mobility   Outcome Measure  Help needed turning from your back to your side while in a flat bed without using bedrails?: None Help needed moving from lying on your back to sitting on the side of a flat bed without using bedrails?: None Help needed moving to and from a bed to a chair (including a wheelchair)?: A Little Help needed standing up from a chair using your arms (e.g., wheelchair or bedside chair)?: A Little Help needed to walk in hospital room?: A Little Help needed climbing 3-5 steps with a railing? : A Lot 6 Click Score: 19    End of Session Equipment Utilized During Treatment: Gait belt Activity Tolerance: Patient tolerated treatment well Patient left: in bed;with call bell/phone within reach;with bed alarm set;with family/visitor present (seated up EOB) Nurse Communication: Mobility status PT Visit Diagnosis: Other abnormalities of gait and mobility (R26.89)     Time: 1610-9604 PT Time Calculation (min) (ACUTE ONLY): 25 min  Charges:    $Gait Training: 23-37 mins PT General Charges $$ ACUTE PT VISIT: 1 Visit                     Tobi Bastos R. PTA Acute Rehabilitation Services Office: [phone]   Catalina Antigua 09/07/2023, 1:30 PM

## 2023-09-07 NOTE — Progress Notes (Signed)
 [facility]  NOTE ROGUE RAFALSKI ZOX:[id number]  DOB: 21-Feb-1934  DOA: 09/03/2023  PCP: [facility]  09/07/2023,2:53 PM  LOS: 3 days   Code Status: DNR from: Home current Dispo: Unclear   89 year old black male-at baseline needs help with all ADLs-unable to fix himself a plate at meals and needs assistance getting to the restroom Known atrial fibrillation CHADVASC >5 on Eliquis follows with Dr. Sharyn Lull Recent hospitalization 12/20 through through 12/24 2024 with small non-Q wave MI-on aspirin Prior CAD with silent MI status post PTCA 2003 Remote tobacco DM TY 2 History of underlying spinal stenosis and sciatica and left foot drop HTN HLD Dementia with sundowning  2/23 MCH from home with facial droop slurred speech bilateral weakness with shuffling gait and difficulty using right arm-wife called 911 CT head showed acute right pontine CVA While in ED documented dark tarry stool-hemoglobin dropped 4 points since several months from baseline of 12.8 Hemoglobin 8.1 WBC 5.5 platelet 211, sodium 137 potassium 4.1 BUN/creatinine 34/1.7 LFTs relatively normal BNP 174 2/24  GI consulted-note no prior GI workup-UGI contemplated  Plan  Acute pontine CVA Poor candidate without decision making/evaluation of GI tract for antiplatelet/DOAC therapy- Secondary prevention with atorvastatin 80 as well as blood pressure control only for now Aspirin relatively contraindicated for now  Possible upper GI bleed Planning for UGI scope 2/28 am after discussion with wife Continue Protonix 40 IV twice daily for now No stool nor reports of further bleed  Acute blood loss anemia Transfused 1 unit PRBC on arrival 2/2 bleeding-iron 41, saturation ratios 14--IV iron given 2/26  Chronic systolic diastolic HF Resumed amlodipine at dose 10, ---losartan 100 on hold Seems euvolemic and was not on PTA Lasix Echo this admission EF 60-65% grade 1 diastolic dysfunction normal valves  Paroxysmal A-fib  CHADVASC >4 Continue amiodarone 200 daily, holding apixaban 5 twice daily for now until scope 2/28  AKI superimposed on CKD 3B-baseline creatinine between 1.3 and 1.5 Meds being adjusted as above-follow labs periodically Would hold going forward losartan as not a good candidate to use this going  DM TY 2 A1c 5.9-not on PTA meds CBGs ranging in the low 180 --stop SSI and CBG as irksome to patient  Dementia Mild-continue Namenda 10 twice daily and gently reorient-this has been going on since 2020 and wife wishes to take him home with her   DVT prophylaxis: SCD  Status is: Inpatient Remains inpatient appropriate because:   Requires scope      Subjective:  Intermittently remains confused otherwise looks well No further bleeding sitting up in chair no chest pain no fever   Objective + exam Vitals:   09/07/23 0353 09/07/23 0500 09/07/23 0743 09/07/23 1228  BP:   (!) 146/65 136/64  Pulse: 68  69 74  Resp:   16 16  Temp:   97.9 F (36.6 C) 98.2 F (36.8 C)  TempSrc:   Oral Oral  SpO2: 97%  100% 97%  Weight:  66 kg    Height:       Filed Weights   09/04/23 1615 09/05/23 0622 09/07/23 0500  Weight: 67.2 kg 68.9 kg 66 kg    Examination:  Alert but not oriented quite confused CTAB no wheeze rales rhonchi Grossly neurological exam remains similar to prior- Smile symmetric power is 5/5 in upper extremities Does not understand enough to follow instructions for cerebellar signs  Data Reviewed: reviewed   CBC    Component Value Date/Time   WBC 6.6 09/07/2023  0712   RBC 3.09 (L) 09/07/2023 0712   HGB 9.6 (L) 09/07/2023 0712   HCT 28.5 (L) 09/07/2023 0712   PLT 219 09/07/2023 0712   MCV 92.2 09/07/2023 0712   MCH 31.1 09/07/2023 0712   MCHC 33.7 09/07/2023 0712   RDW 13.4 09/07/2023 0712   LYMPHSABS 2.1 09/07/2023 0712   MONOABS 0.7 09/07/2023 0712   EOSABS 0.2 09/07/2023 0712   BASOSABS 0.0 09/07/2023 0712      Latest Ref Rng & Units 09/07/2023    7:12 AM  09/06/2023    6:25 AM 09/05/2023    5:49 AM  CMP  Glucose 70 - 99 mg/dL 409  811  914   BUN 8 - 23 mg/dL 23  22  18    Creatinine 0.61 - 1.24 mg/dL 7.82  9.56  2.13   Sodium 135 - 145 mmol/L 136  137  136   Potassium 3.5 - 5.1 mmol/L 4.3  3.8  4.0   Chloride 98 - 111 mmol/L 107  105  108   CO2 22 - 32 mmol/L 22  26  22    Calcium 8.9 - 10.3 mg/dL 8.6  8.5  8.0   Total Protein 6.5 - 8.1 g/dL  6.1    Total Bilirubin 0.0 - 1.2 mg/dL  1.0    Alkaline Phos 38 - 126 U/L  51    AST 15 - 41 U/L  17    ALT 0 - 44 U/L  14      Scheduled Meds:  amiodarone  200 mg Oral Daily   amLODipine  10 mg Oral Daily   atorvastatin  40 mg Oral Daily   memantine  10 mg Oral BID   multivitamin with minerals  1 tablet Oral Daily   pantoprazole (PROTONIX) IV  40 mg Intravenous Q12H   Continuous Infusions:  Time 46  Rhetta Mura, MD  [facility]

## 2023-09-07 NOTE — Progress Notes (Signed)
 Palliative Medicine Progress Note   Patient Name: Alexander Donaldson       Date: 09/07/2023 DOB: 06-11-1934  Age: 89 y.o. MRN#: [id number] Attending Physician: Rhetta Mura, MD Primary Care Physician: [facility] Admit Date: 09/03/2023   HPI/Patient Profile: 89 y.o. male  with past medical history of dementia, atrial fibrillation on Eliquis, CAD with prior MI, diabetes type 2, spinal stenosis and sciatica, HTN, and HLD.  He presented to the ED on 09/03/2023 with facial droop, slurred speech, difficulty walking, and right arm dysfunction. CT head showed acute right pontine CVA.  He is also admitted with suspected upper GI bleed with acute blood loss anemia.  Palliative Medicine was consulted for goals of care.    Subjective: Chart reviewed including, labs, imaging, and progress/consult notes.   Patient is sitting up on the side of the bed. Pleasantly confused.   Wife/Karen and I completed a MOST form today. We reviewed concepts specific to code status, intubation, antibiotics, IV fluids, artifical feeding, and rehospitalization. She has outlined preferences for the following treatment decisions:  Cardiopulmonary Resuscitation: Do Not Attempt Resuscitation (DNR/No CPR)  Medical Interventions: Limited Additional Interventions: Use medical treatment, IV fluids and cardiac monitoring as indicated, DO NOT USE intubation or mechanical ventilation. May consider use of less invasive airway support such as BiPAP or CPAP. Also provide comfort measures. Transfer to the hospital if indicated. Avoid intensive care.   Antibiotics: Antibiotics if indicated  IV Fluids: IV fluids if indicated  Feeding Tube: No feeding tube     Objective:  Physical Exam Vitals reviewed.  Constitutional:       General: He is not in acute distress.    Comments: Frail and chronically ill-appearing  Pulmonary:     Effort: Pulmonary effort is normal.  Neurological:     Mental Status: He is alert. He is confused.  Psychiatric:        Cognition and Memory: Cognition is impaired. Memory is impaired.             Palliative Medicine Assessment & Plan   Assessment: Principal Problem:   Acute upper GI bleed Active Problems:   DM2 (diabetes mellitus, type 2) (HCC)   Essential hypertension   TIA (transient ischemic attack)   Acute blood loss anemia   Facial droop   Weakness of right  upper extremity   AKI (acute kidney injury) (HCC)   Paroxysmal atrial fibrillation (HCC)   Chronic combined systolic and diastolic heart failure (HCC)    Recommendations/Plan: Continue supportive interventions - treat the treatable After further discussion with GI, wife has decided to proceed with upper endoscopy with the hope it will help family make a more informed decision about use of anticoagulation MOST form completed - original given to wife and copy made to scanned into EMR  PMT will continue to follow   Code Status: DNR-Limited   Prognosis:  Unable to determine  Discharge Planning: Likely Home with Home Health   Thank you for allowing the Palliative Medicine Team to assist in the care of this patient.   Time: 38 minutes   Merry Proud, NP   Please contact Palliative Medicine Team phone at [phone] for questions and concerns.  For individual providers, please see AMION.

## 2023-09-07 NOTE — Anesthesia Preprocedure Evaluation (Signed)
 Anesthesia Evaluation  Patient identified by MRN, date of birth, ID band Patient confused    Reviewed: Allergy & Precautions, NPO status , Patient's Chart, lab work & pertinent test results  Airway Mallampati: III  TM Distance: >3 FB Neck ROM: Full    Dental  (+) Dental Advisory Given, Edentulous Upper, Edentulous Lower   Pulmonary pneumonia   Pulmonary exam normal breath sounds clear to auscultation       Cardiovascular hypertension, Pt. on medications + CAD, + Past MI and + Cardiac Stents  + Valvular Problems/Murmurs AS  Rhythm:Regular Rate:Normal + Systolic murmurs Echo 08/2023  1. Left ventricular ejection fraction, by estimation, is 60 to 65%. The left ventricle has normal function. The left ventricle has no regional wall motion abnormalities. There is mild concentric left ventricular hypertrophy of the basal-septal segment. Left ventricular diastolic parameters are consistent with Grade I diastolic dysfunction (impaired relaxation).   2. Right ventricular systolic function is normal. The right ventricular size is normal.   3. Left atrial size was mildly dilated.   4. The mitral valve is normal in structure. Trivial mitral valve regurgitation. No evidence of mitral stenosis.   5. The aortic valve is moderately calcified. The right and left coronary cusps are fused. The aortic valve is tricuspid. There is mild calcification of the aortic valve. Aortic valve regurgitation is trivial. Mild aortic valve stenosis.     Neuro/Psych  PSYCHIATRIC DISORDERS     Dementia TIA Neuromuscular disease    GI/Hepatic negative GI ROS, Neg liver ROS,,,  Endo/Other  diabetes    Renal/GU Renal disease     Musculoskeletal negative musculoskeletal ROS (+)    Abdominal   Peds  Hematology  (+) Blood dyscrasia, anemia   Anesthesia Other Findings   Reproductive/Obstetrics                             Anesthesia  Physical Anesthesia Plan  ASA: 4  Anesthesia Plan: MAC   Post-op Pain Management: Minimal or no pain anticipated   Induction:   PONV Risk Score and Plan: 1 and TIVA, Propofol infusion and Treatment may vary due to age or medical condition  Airway Management Planned:   Additional Equipment:   Intra-op Plan:   Post-operative Plan:   Informed Consent: I have reviewed the patients History and Physical, chart, labs and discussed the procedure including the risks, benefits and alternatives for the proposed anesthesia with the patient or authorized representative who has indicated his/her understanding and acceptance.   Patient has DNR.  Discussed DNR with power of attorney and Suspend DNR.   Dental advisory given  Plan Discussed with: CRNA  Anesthesia Plan Comments:        Anesthesia Quick Evaluation

## 2023-09-07 NOTE — H&P (View-Only) (Signed)
 Patient ID: Alexander Donaldson, male   DOB: [birth year], 89 y.o.   MRN: [id number]    Progress Note   Subjective   Day # 4 CC; acute CVA, dark heme positive stool on admission with drop in hemoglobin of about 4 g over the past couple of months setting of Eliquis.  Patient's wife had decided yesterday that they did not want to proceed with upper endoscopy, then we were notified this morning that family did want to proceed with EGD.  Patient had already eaten  Wife not present in the room this afternoon, patient is sitting up in the chair, seems to be in good spirits, speech is still very garbled.  WBC 6.6/hemoglobin 9.6/hematocrit 28.5 stable BUN 23/creatinine 1.68   Objective   Vital signs in last 24 hours: Temp:  [97.5 F (36.4 C)-98.2 F (36.8 C)] 98 F (36.7 C) (02/27 1532) Pulse Rate:  [66-80] 80 (02/27 1532) Resp:  [16-18] 16 (02/27 1532) BP: (123-178)/(64-77) 123/76 (02/27 1532) SpO2:  [94 %-100 %] 99 % (02/27 1532) Weight:  [66 kg] 66 kg (02/27 0500) Last BM Date : 09/05/23 General:   Early African-American male in NAD, sitting up in the chair Heart:  Regular rate and rhythm; no murmurs Lungs: Respirations even and unlabored, lungs CTA bilaterally Abdomen:  Soft, nontender and nondistended. Normal bowel sounds. Extremities:  Without edema. Neurologic:  Alert and disoriented, pleasant but speech garbled with occasional understandable sentences Psych:  Cooperative. Normal mood and affect.  Intake/Output from previous day: 02/26 0701 - 02/27 0700 In: 158 [IV Piggyback:158] Out: 500 [Urine:500] Intake/Output this shift: No intake/output data recorded.  Lab Results: Recent Labs    09/05/23 0549 09/06/23 0625 09/07/23 0712  WBC 6.0 6.4 6.6  HGB 9.5* 9.3* 9.6*  HCT 26.4* 27.1* 28.5*  PLT 194 200 219   BMET Recent Labs    09/05/23 0549 09/06/23 0625 09/07/23 0712  NA 136 137 136  K 4.0 3.8 4.3  CL 108 105 107  CO2 22 26 22   GLUCOSE 109* 142* 124*  BUN 18 22  23   CREATININE 1.09 1.55* 1.68*  CALCIUM 8.0* 8.5* 8.6*   LFT Recent Labs    09/06/23 0625  PROT 6.1*  ALBUMIN 3.2*  AST 17  ALT 14  ALKPHOS 51  BILITOT 1.0   PT/INR No results for input(s): "LABPROT", "INR" in the last 72 hours.  Studies/Results: MR ANGIO HEAD WO CONTRAST Result Date: 09/06/2023 CLINICAL DATA:  Follow-up examination for stroke. EXAM: MRA HEAD WITHOUT CONTRAST TECHNIQUE: Angiographic images of the Circle of Willis were acquired using MRA technique without intravenous contrast. COMPARISON:  MRI from 09/04/2023. FINDINGS: Anterior circulation: Examination moderately to severely degraded by motion artifact. Both internal carotid arteries are patent through the siphons without visible stenosis. There is question of a 2-3 mm outpouching extending superiorly from the left ICA terminus (series 5, image 132), which could reflect a small aneurysm. Difficult to be certain given motion degradation on this exam. A1 segments patent bilaterally. Grossly normal anterior communicating artery complex. Anterior cerebral arteries patent without stenosis. No M1 stenosis or occlusion. Distal MCA branches perfused and fairly symmetric. Posterior circulation: Both V4 segments patent without stenosis. Right vertebral artery dominant. Both PICA patent at their origins. Basilar patent without stenosis. Superior cerebral arteries patent bilaterally. Both PCAs primarily supplied via the basilar. PCAs are patent to their distal aspects without visible stenosis. Anatomic variants: None significant. Other: None. IMPRESSION: 1. Motion degraded exam. 2. Negative intracranial MRA for large vessel  occlusion. No hemodynamically significant or correctable stenosis. 3. Question 2-3 mm outpouching extending superiorly from the left ICA terminus, suspicious for a small aneurysm. Difficult to be certain of this finding given motion degradation on this exam. Attention at follow-up recommended. Electronically Signed   By:  Rise Mu M.D.   On: 09/06/2023 18:46   VAS US CAROTID Result Date: 09/06/2023 Carotid Arterial Duplex Study Patient Name:  Alexander Donaldson  Date of Exam:   09/06/2023 Medical Rec #: [id number]        Accession #:    [id number] Date of Birth: March 11, 1934         Patient Gender: M Patient Age:   85 years Exam Location:  [facility] Procedure:      VAS US CAROTID Referring Phys: Gevena Mart --------------------------------------------------------------------------------  Indications:       CVA. Risk Factors:      Hypertension, Diabetes, no history of smoking, prior MI,                    coronary artery disease. Other Factors:     Afib, CHF. Comparison Study:  No previous exams Performing Technologist: Jody Hill RVT, RDMS  Examination Guidelines: A complete evaluation includes B-mode imaging, spectral Doppler, color Doppler, and power Doppler as needed of all accessible portions of each vessel. Bilateral testing is considered an integral part of a complete examination. Limited examinations for reoccurring indications may be performed as noted.  Right Carotid Findings: +----------+--------+--------+--------+------------------+------------------+           PSV cm/sEDV cm/sStenosisPlaque DescriptionComments           +----------+--------+--------+--------+------------------+------------------+ CCA Prox  61      10              calcific          intimal thickening +----------+--------+--------+--------+------------------+------------------+ CCA Distal55      9               calcific          intimal thickening +----------+--------+--------+--------+------------------+------------------+ ICA Prox  96      17      1-39%   smooth            Shadowing          +----------+--------+--------+--------+------------------+------------------+ ICA Distal44      9                                                     +----------+--------+--------+--------+------------------+------------------+ ECA       208     2       >50%    calcific          shadowing          +----------+--------+--------+--------+------------------+------------------+ +----------+--------+-------+----------------+-------------------+           PSV cm/sEDV cmsDescribe        Arm Pressure (mmHG) +----------+--------+-------+----------------+-------------------+ [id number]             Multiphasic, WNL                    +----------+--------+-------+----------------+-------------------+ +---------+--------+--+--------+-+---------+ VertebralPSV cm/s49EDV cm/s9Antegrade +---------+--------+--+--------+-+---------+  Left Carotid Findings: +----------+-------+-------+--------+---------------------------------+--------+           PSV    EDV    StenosisPlaque Description  Comments           cm/s   cm/s                                                     +----------+-------+-------+--------+---------------------------------+--------+ CCA Prox  84     12             calcific                                  +----------+-------+-------+--------+---------------------------------+--------+ CCA Distal73     11                                                       +----------+-------+-------+--------+---------------------------------+--------+ ICA Prox  95     16     1-39%   irregular, heterogenous and                                               calcific                                  +----------+-------+-------+--------+---------------------------------+--------+ ICA Distal59     12                                                       +----------+-------+-------+--------+---------------------------------+--------+ ECA       130    7              calcific                                  +----------+-------+-------+--------+---------------------------------+--------+  +----------+--------+--------+---------+-------------------+           PSV cm/sEDV cm/sDescribe Arm Pressure (mmHG) +----------+--------+--------+---------+-------------------+ Subclavian213             Turbulent                    +----------+--------+--------+---------+-------------------+ +---------+--------+--+--------+-+---------+ VertebralPSV cm/s40EDV cm/s9Antegrade +---------+--------+--+--------+-+---------+   Summary: Right Carotid: Velocities in the right ICA are consistent with a 1-39% stenosis. Left Carotid: Velocities in the left ICA are consistent with a 1-39% stenosis.               Unstable plaque seen in proximal ICA. Vertebrals:  Bilateral vertebral arteries demonstrate antegrade flow. Subclavians: Left subclavian artery flow was disturbed. Normal flow hemodynamics              were seen in the right subclavian artery. *See table(s) above for measurements and observations.  Electronically signed by Coral Else MD on 09/06/2023 at 4:45:04 PM.    Final        Assessment / Plan:    #66 89 year old African-American male admitted with an acute pontine CVA  Stable, still with dysarthria  #2 anemia, heme positive stool with 4 g drop in hemoglobin over the past couple of months in setting of Eliquis which is now on hold.  We have not felt that he is a candidate for colonoscopy, but did feel that EGD could be done to help determine source of his heme positive stool.  He had been taking a baby aspirin at home.  Initially family had decided not to pursue EGD and sedation.  We were notified earlier today that family had changed their minds and would now like him to have an endoscopy.  Neuro suggesting that they may want to place him back on anticoagulation.  Plan; we will schedule for EGD with Dr. Myrtie Neither tomorrow morning. N.p.o. after midnight. Will contact patient's wife by phone, we had discussed EGD in detail previously.  She will sign permit.     Principal  Problem:   Acute upper GI bleed Active Problems:   DM2 (diabetes mellitus, type 2) (HCC)   Essential hypertension   TIA (transient ischemic attack)   Acute blood loss anemia   Facial droop   Weakness of right upper extremity   AKI (acute kidney injury) (HCC)   Paroxysmal atrial fibrillation (HCC)   Chronic combined systolic and diastolic heart failure (HCC)     LOS: 3 days   Amy EsterwoodPA-C2/27/2025, 4:04 PM   I have taken an interval history, thoroughly reviewed the chart and examined the patient. I agree with the Advanced Practitioner's note, impression and recommendations, and have recorded additional findings, impressions and recommendations below. I performed a substantive portion of this encounter (>50% time spent), including a complete performance of the medical decision making.  My additional thoughts are as follows:  Anemia stable since admission. EGD tomorrow.  Hopefully this will help the family make a more informed decision about resuming aspirin and/or anticoagulation.   Charlie Pitter III Office:[phone]

## 2023-09-07 NOTE — Progress Notes (Addendum)
 Patient ID: Alexander Donaldson, male   DOB: [birth year], 89 y.o.   MRN: [id number]    Progress Note   Subjective   Day # 4 CC; acute CVA, dark heme positive stool on admission with drop in hemoglobin of about 4 g over the past couple of months setting of Eliquis.  Patient's wife had decided yesterday that they did not want to proceed with upper endoscopy, then we were notified this morning that family did want to proceed with EGD.  Patient had already eaten  Wife not present in the room this afternoon, patient is sitting up in the chair, seems to be in good spirits, speech is still very garbled.  WBC 6.6/hemoglobin 9.6/hematocrit 28.5 stable BUN 23/creatinine 1.68   Objective   Vital signs in last 24 hours: Temp:  [97.5 F (36.4 C)-98.2 F (36.8 C)] 98 F (36.7 C) (02/27 1532) Pulse Rate:  [66-80] 80 (02/27 1532) Resp:  [16-18] 16 (02/27 1532) BP: (123-178)/(64-77) 123/76 (02/27 1532) SpO2:  [94 %-100 %] 99 % (02/27 1532) Weight:  [66 kg] 66 kg (02/27 0500) Last BM Date : 09/05/23 General:   Early African-American male in NAD, sitting up in the chair Heart:  Regular rate and rhythm; no murmurs Lungs: Respirations even and unlabored, lungs CTA bilaterally Abdomen:  Soft, nontender and nondistended. Normal bowel sounds. Extremities:  Without edema. Neurologic:  Alert and disoriented, pleasant but speech garbled with occasional understandable sentences Psych:  Cooperative. Normal mood and affect.  Intake/Output from previous day: 02/26 0701 - 02/27 0700 In: 158 [IV Piggyback:158] Out: 500 [Urine:500] Intake/Output this shift: No intake/output data recorded.  Lab Results: Recent Labs    09/05/23 0549 09/06/23 0625 09/07/23 0712  WBC 6.0 6.4 6.6  HGB 9.5* 9.3* 9.6*  HCT 26.4* 27.1* 28.5*  PLT 194 200 219   BMET Recent Labs    09/05/23 0549 09/06/23 0625 09/07/23 0712  NA 136 137 136  K 4.0 3.8 4.3  CL 108 105 107  CO2 22 26 22   GLUCOSE 109* 142* 124*  BUN 18 22  23   CREATININE 1.09 1.55* 1.68*  CALCIUM 8.0* 8.5* 8.6*   LFT Recent Labs    09/06/23 0625  PROT 6.1*  ALBUMIN 3.2*  AST 17  ALT 14  ALKPHOS 51  BILITOT 1.0   PT/INR No results for input(s): "LABPROT", "INR" in the last 72 hours.  Studies/Results: MR ANGIO HEAD WO CONTRAST Result Date: 09/06/2023 CLINICAL DATA:  Follow-up examination for stroke. EXAM: MRA HEAD WITHOUT CONTRAST TECHNIQUE: Angiographic images of the Circle of Willis were acquired using MRA technique without intravenous contrast. COMPARISON:  MRI from 09/04/2023. FINDINGS: Anterior circulation: Examination moderately to severely degraded by motion artifact. Both internal carotid arteries are patent through the siphons without visible stenosis. There is question of a 2-3 mm outpouching extending superiorly from the left ICA terminus (series 5, image 132), which could reflect a small aneurysm. Difficult to be certain given motion degradation on this exam. A1 segments patent bilaterally. Grossly normal anterior communicating artery complex. Anterior cerebral arteries patent without stenosis. No M1 stenosis or occlusion. Distal MCA branches perfused and fairly symmetric. Posterior circulation: Both V4 segments patent without stenosis. Right vertebral artery dominant. Both PICA patent at their origins. Basilar patent without stenosis. Superior cerebral arteries patent bilaterally. Both PCAs primarily supplied via the basilar. PCAs are patent to their distal aspects without visible stenosis. Anatomic variants: None significant. Other: None. IMPRESSION: 1. Motion degraded exam. 2. Negative intracranial MRA for large vessel  occlusion. No hemodynamically significant or correctable stenosis. 3. Question 2-3 mm outpouching extending superiorly from the left ICA terminus, suspicious for a small aneurysm. Difficult to be certain of this finding given motion degradation on this exam. Attention at follow-up recommended. Electronically Signed   By:  Rise Mu M.D.   On: 09/06/2023 18:46   VAS US CAROTID Result Date: 09/06/2023 Carotid Arterial Duplex Study Patient Name:  Alexander Donaldson  Date of Exam:   09/06/2023 Medical Rec #: [id number]        Accession #:    [id number] Date of Birth: March 11, 1934         Patient Gender: M Patient Age:   85 years Exam Location:  [facility] Procedure:      VAS US CAROTID Referring Phys: Gevena Mart --------------------------------------------------------------------------------  Indications:       CVA. Risk Factors:      Hypertension, Diabetes, no history of smoking, prior MI,                    coronary artery disease. Other Factors:     Afib, CHF. Comparison Study:  No previous exams Performing Technologist: Jody Hill RVT, RDMS  Examination Guidelines: A complete evaluation includes B-mode imaging, spectral Doppler, color Doppler, and power Doppler as needed of all accessible portions of each vessel. Bilateral testing is considered an integral part of a complete examination. Limited examinations for reoccurring indications may be performed as noted.  Right Carotid Findings: +----------+--------+--------+--------+------------------+------------------+           PSV cm/sEDV cm/sStenosisPlaque DescriptionComments           +----------+--------+--------+--------+------------------+------------------+ CCA Prox  61      10              calcific          intimal thickening +----------+--------+--------+--------+------------------+------------------+ CCA Distal55      9               calcific          intimal thickening +----------+--------+--------+--------+------------------+------------------+ ICA Prox  96      17      1-39%   smooth            Shadowing          +----------+--------+--------+--------+------------------+------------------+ ICA Distal44      9                                                     +----------+--------+--------+--------+------------------+------------------+ ECA       208     2       >50%    calcific          shadowing          +----------+--------+--------+--------+------------------+------------------+ +----------+--------+-------+----------------+-------------------+           PSV cm/sEDV cmsDescribe        Arm Pressure (mmHG) +----------+--------+-------+----------------+-------------------+ [id number]             Multiphasic, WNL                    +----------+--------+-------+----------------+-------------------+ +---------+--------+--+--------+-+---------+ VertebralPSV cm/s49EDV cm/s9Antegrade +---------+--------+--+--------+-+---------+  Left Carotid Findings: +----------+-------+-------+--------+---------------------------------+--------+           PSV    EDV    StenosisPlaque Description  Comments           cm/s   cm/s                                                     +----------+-------+-------+--------+---------------------------------+--------+ CCA Prox  84     12             calcific                                  +----------+-------+-------+--------+---------------------------------+--------+ CCA Distal73     11                                                       +----------+-------+-------+--------+---------------------------------+--------+ ICA Prox  95     16     1-39%   irregular, heterogenous and                                               calcific                                  +----------+-------+-------+--------+---------------------------------+--------+ ICA Distal59     12                                                       +----------+-------+-------+--------+---------------------------------+--------+ ECA       130    7              calcific                                  +----------+-------+-------+--------+---------------------------------+--------+  +----------+--------+--------+---------+-------------------+           PSV cm/sEDV cm/sDescribe Arm Pressure (mmHG) +----------+--------+--------+---------+-------------------+ Subclavian213             Turbulent                    +----------+--------+--------+---------+-------------------+ +---------+--------+--+--------+-+---------+ VertebralPSV cm/s40EDV cm/s9Antegrade +---------+--------+--+--------+-+---------+   Summary: Right Carotid: Velocities in the right ICA are consistent with a 1-39% stenosis. Left Carotid: Velocities in the left ICA are consistent with a 1-39% stenosis.               Unstable plaque seen in proximal ICA. Vertebrals:  Bilateral vertebral arteries demonstrate antegrade flow. Subclavians: Left subclavian artery flow was disturbed. Normal flow hemodynamics              were seen in the right subclavian artery. *See table(s) above for measurements and observations.  Electronically signed by Coral Else MD on 09/06/2023 at 4:45:04 PM.    Final        Assessment / Plan:    #66 89 year old African-American male admitted with an acute pontine CVA  Stable, still with dysarthria  #2 anemia, heme positive stool with 4 g drop in hemoglobin over the past couple of months in setting of Eliquis which is now on hold.  We have not felt that he is a candidate for colonoscopy, but did feel that EGD could be done to help determine source of his heme positive stool.  He had been taking a baby aspirin at home.  Initially family had decided not to pursue EGD and sedation.  We were notified earlier today that family had changed their minds and would now like him to have an endoscopy.  Neuro suggesting that they may want to place him back on anticoagulation.  Plan; we will schedule for EGD with Dr. Myrtie Neither tomorrow morning. N.p.o. after midnight. Will contact patient's wife by phone, we had discussed EGD in detail previously.  She will sign permit.     Principal  Problem:   Acute upper GI bleed Active Problems:   DM2 (diabetes mellitus, type 2) (HCC)   Essential hypertension   TIA (transient ischemic attack)   Acute blood loss anemia   Facial droop   Weakness of right upper extremity   AKI (acute kidney injury) (HCC)   Paroxysmal atrial fibrillation (HCC)   Chronic combined systolic and diastolic heart failure (HCC)     LOS: 3 days   Amy EsterwoodPA-C2/27/2025, 4:04 PM   I have taken an interval history, thoroughly reviewed the chart and examined the patient. I agree with the Advanced Practitioner's note, impression and recommendations, and have recorded additional findings, impressions and recommendations below. I performed a substantive portion of this encounter (>50% time spent), including a complete performance of the medical decision making.  My additional thoughts are as follows:  Anemia stable since admission. EGD tomorrow.  Hopefully this will help the family make a more informed decision about resuming aspirin and/or anticoagulation.   Charlie Pitter III Office:[phone]

## 2023-09-07 NOTE — Progress Notes (Addendum)
 STROKE TEAM PROGRESS NOTE   INTERIM HISTORY/SUBJECTIVE Wife is at the bedside.  Patient just got done walking up and down the hall with therapy. He is scheduled for EGD today Neurologically exam is stable and unchanged  OBJECTIVE  CBC    Component Value Date/Time   WBC 6.6 09/07/2023 0712   RBC 3.09 (L) 09/07/2023 0712   HGB 9.6 (L) 09/07/2023 0712   HCT 28.5 (L) 09/07/2023 0712   PLT 219 09/07/2023 0712   MCV 92.2 09/07/2023 0712   MCH 31.1 09/07/2023 0712   MCHC 33.7 09/07/2023 0712   RDW 13.4 09/07/2023 0712   LYMPHSABS 2.1 09/07/2023 0712   MONOABS 0.7 09/07/2023 0712   EOSABS 0.2 09/07/2023 0712   BASOSABS 0.0 09/07/2023 0712    BMET    Component Value Date/Time   NA 136 09/07/2023 0712   NA 139 12/30/2020 0000   K 4.3 09/07/2023 0712   CL 107 09/07/2023 0712   CO2 22 09/07/2023 0712   GLUCOSE 124 (H) 09/07/2023 0712   BUN 23 09/07/2023 0712   BUN 19 12/30/2020 0000   CREATININE 1.68 (H) 09/07/2023 0712   CALCIUM 8.6 (L) 09/07/2023 0712   GFRNONAA 39 (L) 09/07/2023 0712    IMAGING past 24 hours No results found.   Vitals:   09/07/23 0353 09/07/23 0500 09/07/23 0743 09/07/23 1228  BP:   (!) 146/65 136/64  Pulse: 68  69 74  Resp:   16 16  Temp:   97.9 F (36.6 C) 98.2 F (36.8 C)  TempSrc:   Oral Oral  SpO2: 97%  100% 97%  Weight:  66 kg    Height:         PHYSICAL EXAM General:  Alert, well-nourished, well-developed patient in no acute distress Psych:  Mood and affect appropriate for situation CV: Regular rate and rhythm on monitor Respiratory:  Regular, unlabored respirations on room air GI: Abdomen soft and nontender   NEURO:  Mental Status: Awake alert and oriented to self.  Which per wife is his baseline.  He can follow simple commands intermittently.  Poor attention Speech/Language: Aphasia and mild dysarthria.  Wife states that his baseline and usually cannot name objects  Cranial Nerves:  II: PERRL. Visual fields full to  threat III, IV, VI: EOMI tracks around the room. Eyelids elevate symmetrically.  V: Sensation is intact to light touch and symmetrical to face.  VII: Face is symmetrical resting and smiling VIII: hearing intact to voice. IX, X: Palate elevates symmetrically. Phonation is normal.  [id number] shrug 5/5. XII: tongue is midline without fasciculations. Motor: Left upper, bilateral lowers 5/5.  Right upper seems to be a little bit weaker 4/5 Tone: is normal and bulk is normal Sensation- Intact to light touch bilaterally. Extinction absent to light touch to DSS.   Coordination:  Not able to fully assess due to him not following commands   Gait- deferred  Most Recent NIH  1a Level of Conscious.: 0 1b LOC Questions: 2 1c LOC Commands: 1 2 Best Gaze: 0 3 Visual: 0 4 Facial Palsy: 0 5a Motor Arm - left: 0 5b Motor Arm - Right: 1 6a Motor Leg - Left: 0 6b Motor Leg - Right: 0 7 Limb Ataxia: 1 8 Sensory: 0 9 Best Language: 2 10 Dysarthria: 1 11 Extinct. and Inatten.: 0 TOTAL: 8   ASSESSMENT/PLAN  [name] is a 89 y.o. male with history of  atrial fibrillation on Eliquis, diabetes, hypertension, hyperlipidemia, heart failure, CKD,  CAD, Alzheimer's who presents with increasing difficulty walking and some right arm dysfunction he was also noted to have a GI bleed NIH on Admission 7  Stroke: right pontine infarct, etiology: Likely small vessel disease CT head No acute abnormality. Small vessel disease. Atrophy.    MRI 16 mm acute infarct within the right aspect of the pons. Mild-to-moderate chronic small vessel ischemic change.  Chronic left thalamic infarct.  Moderate to advanced cerebral atrophy MRA unremarkable Carotid Doppler negative 2D Echo EF 60 to 65% LDL 82 HgbA1c 5.9 VTE prophylaxis -SCDs Eliquis and 81 mg aspirin prior to admission, now on No antithrombotic due to acute GI bleed and pending EGD Therapy recommendations: Home health OT Disposition:  Pending  Atrial fibrillation Home Meds: Eliquis, amiodarone 200 mg, Continue telemetry monitoring Hold anticoagulation due to acute GI bleed.  EGD scheduled for tomorrow  Hypertension Home meds: Amlodipine 2.5 mg, losartan 100 mg Stable on the high end On amlodipine 10 Long-term BP goal normotensive  Hyperlipidemia Home meds: Atorvastatin 20 mg LDL 82, goal < 70 Increase atorvastatin to 40 mg Continue statin at discharge  Dementia Continue home Namenda  ? upper GI bleed Acute blood loss anemia GI is being consulted Scheduled for EGD tomorrow Holding Eliquis and aspirin at this time On iron IV  Other Stroke Risk Factors Advanced age Coronary artery disease History of stroke on imaging - Chronic left thalamic   Other Active Problems AKI on CKD, creatinine 1.27--1.55 -hold off home losartan  Hospital day # 3   Gevena Mart DNP, ACNPC-AG  [facility]  ATTENDING NOTE: I reviewed above note and agree with the assessment and plan. Pt was seen and examined.   Wife and PT are at the bedside. Pt sitting at the edge of bed, awake, alert, eyes open, hard of hearing, orientated to people only, not to time place or age. Seems to have fluent speech output but occasionally make sense, largely word salad and not sensible. No gaze palsy, tracking bilaterally, blinking to visual threat bilaterally. No significant facial droop. Tongue midline. Bilateral UEs 4/5, no drift. Bilaterally LEs 4/5, no drift. Sensation not cooperative, coordination very slow bilaterally but no obvious ataxia. Walked with PT in the hallway, able to walk with walker slow but no fall tendency.  Patient hemoglobin stable, pending EGD tomorrow.  Hold off aspirin and Eliquis for now, consider resume after EGD if cleared by GI.  BP on the high end, increase amlodipine to 10 mg daily.  Continue supportive care for dementia, telemonitoring, PT and OT recommend home health.  Will follow.  For detailed  assessment and plan, please refer to above/below as I have made changes wherever appropriate.   Marvel Plan, MD PhD Stroke Neurology 09/07/2023 6:43 PM    To contact Stroke Continuity provider, please refer to [email or web address]. After hours, contact General Neurology

## 2023-09-07 NOTE — Plan of Care (Signed)
 Patient progressing with mobility. Plan for EGD, conts on Iron Ivf, Hgb stable.   Problem: Education: Goal: Ability to describe self-care measures that may prevent or decrease complications (Diabetes Survival Skills Education) will improve Outcome: Not Met (add Reason) Goal: Individualized Educational Video(s) Outcome: Not Met (add Reason)   Problem: Nutritional: Goal: Maintenance of adequate nutrition will improve Outcome: Not Met (add Reason) Goal: Progress toward achieving an optimal weight will improve Outcome: Not Met (add Reason)   Problem: Skin Integrity: Goal: Risk for impaired skin integrity will decrease Outcome: Not Met (add Reason)   Problem: Tissue Perfusion: Goal: Adequacy of tissue perfusion will improve Outcome: Not Met (add Reason)   Problem: Safety: Goal: Ability to remain free from injury will improve Outcome: Not Met (add Reason)   Problem: Pain Managment: Goal: General experience of comfort will improve and/or be controlled Outcome: Not Met (add Reason)

## 2023-09-08 ENCOUNTER — Inpatient Hospital Stay (HOSPITAL_COMMUNITY): Payer: No Typology Code available for payment source | Admitting: Anesthesiology

## 2023-09-08 ENCOUNTER — Encounter (HOSPITAL_COMMUNITY)
Admission: EM | Disposition: A | Discharge status: Home / Self Care | Payer: Self-pay | Source: Home / Self Care | Attending: Family Medicine

## 2023-09-08 ENCOUNTER — Encounter (HOSPITAL_COMMUNITY): Payer: Self-pay | Admitting: Internal Medicine

## 2023-09-08 DIAGNOSIS — D500 Iron deficiency anemia secondary to blood loss (chronic): Secondary | ICD-10-CM | POA: Diagnosis not present

## 2023-09-08 DIAGNOSIS — K922 Gastrointestinal hemorrhage, unspecified: Secondary | ICD-10-CM | POA: Diagnosis not present

## 2023-09-08 DIAGNOSIS — I2510 Atherosclerotic heart disease of native coronary artery without angina pectoris: Secondary | ICD-10-CM

## 2023-09-08 DIAGNOSIS — R195 Other fecal abnormalities: Secondary | ICD-10-CM | POA: Diagnosis not present

## 2023-09-08 DIAGNOSIS — I639 Cerebral infarction, unspecified: Secondary | ICD-10-CM | POA: Diagnosis not present

## 2023-09-08 DIAGNOSIS — I10 Essential (primary) hypertension: Secondary | ICD-10-CM

## 2023-09-08 DIAGNOSIS — I252 Old myocardial infarction: Secondary | ICD-10-CM

## 2023-09-08 HISTORY — PX: ESOPHAGOGASTRODUODENOSCOPY (EGD) WITH PROPOFOL: SHX5813

## 2023-09-08 LAB — CBC WITH DIFFERENTIAL/PLATELET
Abs Immature Granulocytes: 0.01 10*3/uL (ref 0.00–0.07)
Basophils Absolute: 0 10*3/uL (ref 0.0–0.1)
Basophils Relative: 0 %
Eosinophils Absolute: 0.1 10*3/uL (ref 0.0–0.5)
Eosinophils Relative: 2 %
HCT: 26.4 % — ABNORMAL LOW (ref 39.0–52.0)
Hemoglobin: 8.9 g/dL — ABNORMAL LOW (ref 13.0–17.0)
Immature Granulocytes: 0 %
Lymphocytes Relative: 28 %
Lymphs Abs: 1.6 10*3/uL (ref 0.7–4.0)
MCH: 31 pg (ref 26.0–34.0)
MCHC: 33.7 g/dL (ref 30.0–36.0)
MCV: 92 fL (ref 80.0–100.0)
Monocytes Absolute: 0.5 10*3/uL (ref 0.1–1.0)
Monocytes Relative: 9 %
Neutro Abs: 3.5 10*3/uL (ref 1.7–7.7)
Neutrophils Relative %: 61 %
Platelets: 226 10*3/uL (ref 150–400)
RBC: 2.87 MIL/uL — ABNORMAL LOW (ref 4.22–5.81)
RDW: 13.6 % (ref 11.5–15.5)
WBC: 5.8 10*3/uL (ref 4.0–10.5)
nRBC: 0 % (ref 0.0–0.2)

## 2023-09-08 LAB — BASIC METABOLIC PANEL
Anion gap: 8 (ref 5–15)
BUN: 30 mg/dL — ABNORMAL HIGH (ref 8–23)
CO2: 23 mmol/L (ref 22–32)
Calcium: 8.7 mg/dL — ABNORMAL LOW (ref 8.9–10.3)
Chloride: 108 mmol/L (ref 98–111)
Creatinine, Ser: 1.97 mg/dL — ABNORMAL HIGH (ref 0.61–1.24)
GFR, Estimated: 32 mL/min — ABNORMAL LOW (ref >60)
Glucose, Bld: 128 mg/dL — ABNORMAL HIGH (ref 70–99)
Potassium: 3.9 mmol/L (ref 3.5–5.1)
Sodium: 139 mmol/L (ref 135–145)

## 2023-09-08 LAB — CULTURE, BLOOD (ROUTINE X 2)
Culture: NO GROWTH
Culture: NO GROWTH
Special Requests: ADEQUATE

## 2023-09-08 LAB — GLUCOSE, CAPILLARY: Glucose-Capillary: 115 mg/dL — ABNORMAL HIGH (ref 70–99)

## 2023-09-08 SURGERY — ESOPHAGOGASTRODUODENOSCOPY (EGD) WITH PROPOFOL
Anesthesia: Monitor Anesthesia Care

## 2023-09-08 MED ORDER — LACTATED RINGERS IV SOLN: INTRAVENOUS | Status: DC | PRN

## 2023-09-08 MED ORDER — EPHEDRINE SULFATE (PRESSORS) 50 MG/ML IJ SOLN
INTRAMUSCULAR | Status: DC | PRN
  Administered 2023-09-08: 10 mg via INTRAVENOUS

## 2023-09-08 MED ORDER — LIDOCAINE 2% (20 MG/ML) 5 ML SYRINGE
INTRAMUSCULAR | Status: DC | PRN
  Administered 2023-09-08: 100 mg via INTRAVENOUS

## 2023-09-08 MED ORDER — POLYETHYLENE GLYCOL 3350 17 G PO PACK
17.0000 g | PACK | Freq: Every day | ORAL | Status: DC
  Administered 2023-09-08 – 2023-09-09 (×2): 17 g via ORAL
  Filled 2023-09-08 (×2): qty 1

## 2023-09-08 MED ORDER — PROPOFOL 10 MG/ML IV BOLUS
INTRAVENOUS | Status: DC | PRN
  Administered 2023-09-08: 20 mg via INTRAVENOUS
  Administered 2023-09-08: 30 mg via INTRAVENOUS

## 2023-09-08 MED ORDER — PANTOPRAZOLE SODIUM 40 MG PO TBEC
40.0000 mg | DELAYED_RELEASE_TABLET | Freq: Two times a day (BID) | ORAL | Status: DC
  Administered 2023-09-08 – 2023-09-09 (×3): 40 mg via ORAL
  Filled 2023-09-08 (×3): qty 1

## 2023-09-08 MED ORDER — PROPOFOL 500 MG/50ML IV EMUL
INTRAVENOUS | Status: DC | PRN
  Administered 2023-09-08: 100 ug/kg/min via INTRAVENOUS

## 2023-09-08 SURGICAL SUPPLY — 14 items

## 2023-09-08 NOTE — Progress Notes (Signed)
 Occupational Therapy Treatment Patient Details Name: Alexander Donaldson MRN: [id number] DOB: Aug 18, 1933 Today's Date: 09/08/2023   History of present illness 89 y.o. male presents 09/04/23 with increasing difficulty walking and some right arm dysfunction. Admitted with suspected acute upper GIB complicated by acute blood loss anemia. MRI shows acute R pontine CVA. PMH atrial fibrillation on Eliquis, diabetes, hypertension, Alzheimer's, coronary disease   OT comments  Pt alert and energetic, ambulating to recliner with CGA but needs min A to safely position hips on chair to reduce risk of sliding out. Pt able to feed self with setup/supervision, he tends to stuff too much food in mouth before swallowing what he already has so reinforced with pt spouse strategies and need for close monitoring, issued plate guard as pt with poor ability to maintain control of food when scooping. OT to continue to progress pt as able. DC plans remain appropriate for [facility] but with increased PCA/caregiver support.       If plan is discharge home, recommend the following:  A lot of help with walking and/or transfers;A lot of help with bathing/dressing/bathroom;Assistance with cooking/housework;Direct supervision/assist for medications management;Other (comment) (increased caregiver support/PCAs)   Geophysical data processor (measurements OT);Wheelchair cushion (measurements OT)    Recommendations for Other Services      Precautions / Restrictions Precautions Precautions: Fall Restrictions Weight Bearing Restrictions Per Provider Order: No       Mobility Bed Mobility               General bed mobility comments: pt sitting EOB on arrival    Transfers Overall transfer level: Needs assistance Equipment used: Rolling walker (2 wheels) Transfers: Sit to/from Stand Sit to Stand: Contact guard assist           General transfer comment: ambulated around bed to recliner with CGA, but needed min A  during transfer to safely position hips on chair     Balance Overall balance assessment: Needs assistance Sitting-balance support: Feet supported, No upper extremity supported Sitting balance-Leahy Scale: Good     Standing balance support: Bilateral upper extremity supported, During functional activity Standing balance-Leahy Scale: Fair Standing balance comment: 3 steps to recliner without UE support                           ADL either performed or assessed with clinical judgement   ADL   Eating/Feeding: Sitting;Set up;Supervision/ safety Eating/Feeding Details (indicate cue type and reason): Pt needing verbal cues and supervision when eating to chew AND swallow food before consuming another bite of meal. Discussed with pt spouse that he needs to be monitored when eating and sips of water can help faciliate a swallow when needed. pt setup with tray close and utensils placed in hand to eat, able to stab food but does poor with scooping. Educated pt spouse on the use of plate guard to provide barrier for scooping food from plate.                                 Functional mobility during ADLs: Contact guard assist;Rolling walker (2 wheels)      Extremity/Trunk Assessment              Vision       Perception     Praxis     Communication Communication Communication: Impaired Factors Affecting Communication: Difficulty expressing self;Reduced clarity of speech;Hearing impaired  Cognition Arousal: Alert Behavior During Therapy: WFL for tasks assessed/performed Cognition: History of cognitive impairments                               Following commands: Impaired Following commands impaired: Follows one step commands inconsistently, Follows one step commands with increased time      Cueing   Cueing Techniques: Verbal cues, Gestural cues, Visual cues  Exercises      Shoulder Instructions       General Comments Wife present,  plate guard supplied    Pertinent Vitals/ Pain       Pain Assessment Pain Assessment: Faces Faces Pain Scale: No hurt  Home Living                                          Prior Functioning/Environment              Frequency  Min 1X/week        Progress Toward Goals  OT Goals(current goals can now be found in the care plan section)  Progress towards OT goals: Progressing toward goals  Acute Rehab OT Goals Patient Stated Goal: To be able to return home with level of care needed OT Goal Formulation: With family Time For Goal Achievement: 09/20/23 Potential to Achieve Goals: Good  Plan      Co-evaluation                 AM-PAC OT "6 Clicks" Daily Activity     Outcome Measure   Help from another person eating meals?: A Little Help from another person taking care of personal grooming?: A Lot Help from another person toileting, which includes using toliet, bedpan, or urinal?: A Lot Help from another person bathing (including washing, rinsing, drying)?: A Lot Help from another person to put on and taking off regular upper body clothing?: A Lot Help from another person to put on and taking off regular lower body clothing?: A Lot 6 Click Score: 13    End of Session Equipment Utilized During Treatment: Rolling walker (2 wheels);Gait belt  OT Visit Diagnosis: Unsteadiness on feet (R26.81);Other abnormalities of gait and mobility (R26.89);Cognitive communication deficit (R41.841) Symptoms and signs involving cognitive functions: Cerebral infarction (R pontine)   Activity Tolerance Patient tolerated treatment well   Patient Left with call bell/phone within reach;with family/visitor present;in chair   Nurse Communication Mobility status        Time: 1712-1740 OT Time Calculation (min): 28 min  Charges: OT General Charges $OT Visit: 1 Visit OT Treatments $Self Care/Home Management : 8-22 mins $Therapeutic Activity: 8-22  mins  09/08/2023  AB, OTR/L  Acute Rehabilitation Services  Office: [phone]   Tristan Schroeder 09/08/2023, 6:10 PM

## 2023-09-08 NOTE — Interval H&P Note (Signed)
 History and Physical Interval Note:  09/08/2023 7:43 AM  Alexander Donaldson  has presented today for surgery, with the diagnosis of anemia, heme positive.  The various methods of treatment have been discussed with the patient and family. After consideration of risks, benefits and other options for treatment, the patient has consented to  Procedure(s): ESOPHAGOGASTRODUODENOSCOPY (EGD) WITH PROPOFOL (N/A) as a surgical intervention.  The patient's history has been reviewed, patient examined, no change in status, stable for surgery.  I have reviewed the patient's chart and labs.  Questions were answered to the patient's satisfaction.    Patient seen in the endoscopy pre-procedure area with his wife present.  Charlie Pitter III

## 2023-09-08 NOTE — Progress Notes (Signed)
 [facility] ROUNDING  NOTE Alexander Donaldson WGN:[id number]  DOB: 12/11/1933  DOA: 09/03/2023  PCP: [facility]  09/08/2023,9:29 AM  LOS: 4 days   Code Status: DNR from: Home current Dispo: Unclear   89 year old black male-at baseline needs help with all ADLs-unable to fix himself a plate at meals and needs assistance getting to the restroom Known atrial fibrillation CHADVASC >5 on Eliquis follows with Dr. Sharyn Lull Recent hospitalization 12/20 through through 12/24 2024 with small non-Q wave MI-on aspirin Prior CAD with silent MI status post PTCA 2003 Remote tobacco DM TY 2 History of underlying spinal stenosis and sciatica and left foot drop HTN HLD Dementia with sundowning  2/23 MCH from home with facial droop slurred speech bilateral weakness with shuffling gait and difficulty using right arm-wife called 911 CT head showed acute right pontine CVA While in ED documented dark tarry stool-hemoglobin dropped 4 points since several months from baseline of 12.8 Hemoglobin 8.1 WBC 5.5 platelet 211, sodium 137 potassium 4.1 BUN/creatinine 34/1.7 LFTs relatively normal BNP 174 2/24  GI consulted-note no prior GI workup-UGI contemplated 2/28 upper GI endoscopy showed normal esophagus with no evidences of bleeding  Plan  Acute pontine CVA Discussed with wife-she wants to think carefully about resumption of DOAC-patient was on 5 twice daily Eliquis and may be able to come down to 2.5 twice daily  Secondary prevention with atorvastatin 80  Would not add aspirin to DOAC given risk of bleed  Possible upper GI bleed-upper GI scope as above Upper GI scope negative-change Protonix to 40 twice daily probably lifelong Would not add aspirin to DOAC as above-will consider Eliquis 2.5 twice daily in the next several days depending on family decision making Monitor for bleeding Patient's wife does not want to go through with colonoscopy as it would be onerous on patient to cooperate with prep--I have  encouraged her to think a little bit more about this  Acute blood loss anemia Transfused 1 unit PRBC on arrival 2/2 bleeding-iron 41, saturation ratios 14--IV iron given 2/26  Chronic systolic diastolic HF Resumed amlodipine at dose 10, ---losartan 100 held this admission Seems euvolemic and was not on PTA Lasix Echo this admission EF 60-65% grade 1 diastolic dysfunction normal valves  Paroxysmal A-fib CHADVASC >4 Continue amiodarone 200 daily--DOAC decision pending  AKI superimposed on CKD 3B-baseline creatinine between 1.3 and 1.5 Meds being adjusted as above-follow labs periodically Would hold going forward losartan   DM TY 2 A1c 5.9-not on PTA meds CBGs ranging in the low 100 range and overall below 160 --stop SSI and CBG as irksome to patient  Dementia Namenda 10 twice daily and gently reorient-this has been going on since 2020 and wife wishes to take him home with her   DVT prophylaxis: SCD  Status is: Inpatient Remains inpatient appropriate because:   Requires scope      Subjective:  Back from endoscopy, a little bit sleepy Following commands but cannot get a full exam I had a brief discussion with the wife as dictated above and she wants to think about blood thinners   Objective + exam Vitals:   09/08/23 0823 09/08/23 0830 09/08/23 0845 09/08/23 0900  BP: (!) 92/44 (!) 110/48 (!) 113/56 (!) 124/58  Pulse: 68 69 77 72  Resp: 16 17 18 18   Temp: 98.4 F (36.9 C)   98.1 F (36.7 C)  TempSrc:    Oral  SpO2: 100% 100% 100% 100%  Weight:      Height:  Filed Weights   09/04/23 1615 09/05/23 0622 09/07/23 0500  Weight: 67.2 kg 68.9 kg 66 kg    Examination:  Slightly disoriented but pleasant  EOMI NCAT no focal deficits seems comfortable S1-S2 no murmur no rub no gallop Abdomen is soft no rebound Able to raise arms above head Able to raise lower extremities Rest of neuroexam has not significantly changed  Data Reviewed: reviewed   CBC     Component Value Date/Time   WBC 6.6 09/07/2023 0712   RBC 3.09 (L) 09/07/2023 0712   HGB 9.6 (L) 09/07/2023 0712   HCT 28.5 (L) 09/07/2023 0712   PLT 219 09/07/2023 0712   MCV 92.2 09/07/2023 0712   MCH 31.1 09/07/2023 0712   MCHC 33.7 09/07/2023 0712   RDW 13.4 09/07/2023 0712   LYMPHSABS 2.1 09/07/2023 0712   MONOABS 0.7 09/07/2023 0712   EOSABS 0.2 09/07/2023 0712   BASOSABS 0.0 09/07/2023 0712      Latest Ref Rng & Units 09/07/2023    7:12 AM 09/06/2023    6:25 AM 09/05/2023    5:49 AM  CMP  Glucose 70 - 99 mg/dL 284  132  440   BUN 8 - 23 mg/dL 23  22  18    Creatinine 0.61 - 1.24 mg/dL 1.02  7.25  3.66   Sodium 135 - 145 mmol/L 136  137  136   Potassium 3.5 - 5.1 mmol/L 4.3  3.8  4.0   Chloride 98 - 111 mmol/L 107  105  108   CO2 22 - 32 mmol/L 22  26  22    Calcium 8.9 - 10.3 mg/dL 8.6  8.5  8.0   Total Protein 6.5 - 8.1 g/dL  6.1    Total Bilirubin 0.0 - 1.2 mg/dL  1.0    Alkaline Phos 38 - 126 U/L  51    AST 15 - 41 U/L  17    ALT 0 - 44 U/L  14      Scheduled Meds:  amiodarone  200 mg Oral Daily   amLODipine  10 mg Oral Daily   atorvastatin  40 mg Oral Daily   memantine  10 mg Oral BID   multivitamin with minerals  1 tablet Oral Daily   pantoprazole (PROTONIX) IV  40 mg Intravenous Q12H   Continuous Infusions:  Time 36  Rhetta Mura, MD  [facility]

## 2023-09-08 NOTE — Progress Notes (Signed)
 Physical Therapy Treatment Patient Details Name: Alexander Donaldson MRN: [id number] DOB: 1933-09-27 Today's Date: 09/08/2023   History of Present Illness 89 y.o. male presents 09/04/23 with increasing difficulty walking and some right arm dysfunction. Admitted with suspected acute upper GIB complicated by acute blood loss anemia. MRI shows acute R pontine CVA. PMH atrial fibrillation on Eliquis, diabetes, hypertension, Alzheimer's, coronary disease    PT Comments  Pt resting in bed on arrival, sleeping but easily roused and agreeable to session with continued progress towards acute goals. Pt continues to be limited in safe mobility by baseline cognitive deficits, RLE weakness, impaired balance/postural reactions and decreased activity tolerance. Pt requiring cues for safe rollator use throughout session with poor carryover for locking brakes prior to standing or sitting, and poor ability to correct proximity to rollator during gait needing consistent cues and min A to correct. Pt without LOB throughout session however remains at risk for falls due to above. Pt spouse and daughter present throughout session and supportive. Pt continues to benefit from skilled PT services to progress toward functional mobility goals.     If plan is discharge home, recommend the following: A little help with walking and/or transfers;Direct supervision/assist for medications management;Direct supervision/assist for financial management;Supervision due to cognitive status;Help with stairs or ramp for entrance   Can travel by private vehicle        Equipment Recommendations  None recommended by PT    Recommendations for Other Services       Precautions / Restrictions Precautions Precautions: Fall Restrictions Weight Bearing Restrictions Per Provider Order: No     Mobility  Bed Mobility Overal bed mobility: Needs Assistance Bed Mobility: Supine to Sit, Sit to Supine     Supine to sit: Contact guard      General bed mobility comments: CGA for safety with increased cues for attention to task and sequencing    Transfers Overall transfer level: Needs assistance Equipment used: Rollator (4 wheels) Transfers: Sit to/from Stand Sit to Stand: Min assist, Contact guard assist           General transfer comment: light min A to boost to stand and steady on rise from EOB and standard chair x3, abel to complete x5 at end of session with CGA for safety    Ambulation/Gait Ambulation/Gait assistance: Min assist, Contact guard assist Gait Distance (Feet): 250 Feet Assistive device: Rollator (4 wheels) Gait Pattern/deviations: Step-through pattern, Decreased stride length, Shuffle Gait velocity: decr     General Gait Details: cues for closer proximity to rollator as pt with flexed posture, min A to steady with fatigue   Stairs             Wheelchair Mobility     Tilt Bed    Modified Rankin (Stroke Patients Only) Modified Rankin (Stroke Patients Only) Pre-Morbid Rankin Score: Moderately severe disability Modified Rankin: Moderately severe disability     Balance Overall balance assessment: Needs assistance Sitting-balance support: Feet supported, No upper extremity supported Sitting balance-Leahy Scale: Good Sitting balance - Comments: donning bilat socks with good control of balance   Standing balance support: Bilateral upper extremity supported, During functional activity Standing balance-Leahy Scale: Poor Standing balance comment: reliant on UE supprot                            Communication Communication Communication: Impaired Factors Affecting Communication: Difficulty expressing self;Reduced clarity of speech;Hearing impaired  Cognition Arousal: Alert Behavior During Therapy: [facility] for  tasks assessed/performed   PT - Cognitive impairments: History of cognitive impairments                       PT - Cognition Comments: pleasantly  confused Following commands: Impaired Following commands impaired: Follows one step commands inconsistently, Follows one step commands with increased time    Cueing Cueing Techniques: Verbal cues, Gestural cues, Visual cues  Exercises Other Exercises Other Exercises: seril sit<>stand x5    General Comments General comments (skin integrity, edema, etc.): wife and daughter present and supportive      Pertinent Vitals/Pain Pain Assessment Pain Assessment: No/denies pain Pain Intervention(s): Monitored during session    Home Living                          Prior Function            PT Goals (current goals can now be found in the care plan section) Acute Rehab PT Goals Patient Stated Goal: none stated PT Goal Formulation: With patient/family Time For Goal Achievement: 09/19/23 Progress towards PT goals: Progressing toward goals    Frequency    Min 1X/week      PT Plan      Co-evaluation              AM-PAC PT "6 Clicks" Mobility   Outcome Measure  Help needed turning from your back to your side while in a flat bed without using bedrails?: None Help needed moving from lying on your back to sitting on the side of a flat bed without using bedrails?: None Help needed moving to and from a bed to a chair (including a wheelchair)?: A Little Help needed standing up from a chair using your arms (e.g., wheelchair or bedside chair)?: A Little Help needed to walk in hospital room?: A Little Help needed climbing 3-5 steps with a railing? : A Lot 6 Click Score: 19    End of Session Equipment Utilized During Treatment: Gait belt Activity Tolerance: Patient tolerated treatment well Patient left: in bed;with call bell/phone within reach;with family/visitor present (seated up EOB) Nurse Communication: Mobility status PT Visit Diagnosis: Other abnormalities of gait and mobility (R26.89)     Time: 4098-1191 PT Time Calculation (min) (ACUTE ONLY): 28  min  Charges:    $Gait Training: 23-37 mins PT General Charges $$ ACUTE PT VISIT: 1 Visit                     Tobi Bastos R. PTA Acute Rehabilitation Services Office: [phone]   Catalina Antigua 09/08/2023, 4:22 PM

## 2023-09-08 NOTE — Progress Notes (Signed)
 STROKE TEAM PROGRESS NOTE   INTERIM HISTORY/SUBJECTIVE Friend is at the bedside.  Patient had EGD today, which is negative for upper GI bleeding work up. Per Dr. Mahala Menghini, had discussion with wife and wife wants more palliative approaches.   OBJECTIVE  CBC    Component Value Date/Time   WBC 5.8 09/08/2023 0958   RBC 2.87 (L) 09/08/2023 0958   HGB 8.9 (L) 09/08/2023 0958   HCT 26.4 (L) 09/08/2023 0958   PLT 226 09/08/2023 0958   MCV 92.0 09/08/2023 0958   MCH 31.0 09/08/2023 0958   MCHC 33.7 09/08/2023 0958   RDW 13.6 09/08/2023 0958   LYMPHSABS 1.6 09/08/2023 0958   MONOABS 0.5 09/08/2023 0958   EOSABS 0.1 09/08/2023 0958   BASOSABS 0.0 09/08/2023 0958    BMET    Component Value Date/Time   NA 139 09/08/2023 0958   NA 139 12/30/2020 0000   K 3.9 09/08/2023 0958   CL 108 09/08/2023 0958   CO2 23 09/08/2023 0958   GLUCOSE 128 (H) 09/08/2023 0958   BUN 30 (H) 09/08/2023 0958   BUN 19 12/30/2020 0000   CREATININE 1.97 (H) 09/08/2023 0958   CALCIUM 8.7 (L) 09/08/2023 0958   GFRNONAA 32 (L) 09/08/2023 0958    IMAGING past 24 hours No results found.   Vitals:   09/08/23 0845 09/08/23 0900 09/08/23 1042 09/08/23 1613  BP: (!) 113/56 (!) 124/58 (!) 137/55 138/71  Pulse: 77 72 72 72  Resp: 18 18 18 18   Temp:  98.1 F (36.7 C) 98.1 F (36.7 C) (!) 97.5 F (36.4 C)  TempSrc:  Oral Oral Oral  SpO2: 100% 100% 96% 99%  Weight:      Height:         PHYSICAL EXAM General:  Alert, well-nourished, well-developed patient in no acute distress Psych:  Mood and affect appropriate for situation CV: Regular rate and rhythm on monitor Respiratory:  Regular, unlabored respirations on room air GI: Abdomen soft and nontender   NEURO:  Pt sitting at the edge of bed, awake, alert, eyes open, hard of hearing, orientated to people only, not to time place or age. Seems to have fluent speech output but occasionally make sense, largely word salad and not sensible. No gaze palsy,  tracking bilaterally, blinking to visual threat bilaterally. No significant facial droop. Tongue midline. Bilateral UEs 4/5, no drift. Bilaterally LEs 4/5, no drift. Sensation not cooperative, coordination very slow bilaterally but no obvious ataxia. Walked with PT in the hallway, able to walk with walker slow but no fall tendency.   ASSESSMENT/PLAN  [name] is a 89 y.o. male with history of  atrial fibrillation on Eliquis, diabetes, hypertension, hyperlipidemia, heart failure, CKD, CAD, Alzheimer's who presents with increasing difficulty walking and some right arm dysfunction he was also noted to have a GI bleed NIH on Admission 7  Stroke: right pontine infarct, etiology: Likely small vessel disease CT head No acute abnormality. Small vessel disease. Atrophy.    MRI 16 mm acute infarct within the right aspect of the pons. Mild-to-moderate chronic small vessel ischemic change.  Chronic left thalamic infarct.  Moderate to advanced cerebral atrophy MRA unremarkable Carotid Doppler negative 2D Echo EF 60 to 65% LDL 82 HgbA1c 5.9 VTE prophylaxis -SCDs Eliquis and 81 mg aspirin prior to admission, now on No antithrombotic as wife wants more palliative approaches at this time. Therapy recommendations: Home health OT Disposition: Pending  Atrial fibrillation Home Meds: Eliquis, amiodarone 200 mg, now on  No antithrombotic as wife wants more palliative approaches at this time.  Hypertension Home meds: Amlodipine 2.5 mg, losartan 100 mg Stable on the high end On amlodipine 10 Long-term BP goal normotensive  Hyperlipidemia Home meds: Atorvastatin 20 mg LDL 82, goal < 70 Increase atorvastatin to 40 mg Continue statin at discharge  Dementia Continue home Namenda  ? upper GI bleed Acute blood loss anemia GI is being consulted EGD negative so far On iron IV  Other Stroke Risk Factors Advanced age Coronary artery disease History of stroke on imaging - Chronic left thalamic    Other Active Problems AKI on CKD, creatinine 1.27--1.55 - 1.68--1.97, hold off home losartan  Hospital day # 4  Neurology will sign off. Please call with questions. Thanks for the consult.   Marvel Plan, MD PhD Stroke Neurology 09/08/2023 5:26 PM    To contact Stroke Continuity provider, please refer to [email or web address]. After hours, contact General Neurology

## 2023-09-08 NOTE — Plan of Care (Signed)
 EGD done today, result was unremarkable. Possible DC tomorrow.   Problem: Nutritional: Goal: Maintenance of adequate nutrition will improve Outcome: Not Met (add Reason) Goal: Progress toward achieving an optimal weight will improve Outcome: Not Met (add Reason)   Problem: Fluid Volume: Goal: Ability to maintain a balanced intake and output will improve Outcome: Not Met (add Reason)   Problem: Skin Integrity: Goal: Risk for impaired skin integrity will decrease Outcome: Not Met (add Reason)   Problem: Tissue Perfusion: Goal: Adequacy of tissue perfusion will improve Outcome: Not Met (add Reason)   Problem: Elimination: Goal: Will not experience complications related to bowel motility Outcome: Not Met (add Reason) Goal: Will not experience complications related to urinary retention Outcome: Not Met (add Reason)   Problem: Pain Managment: Goal: General experience of comfort will improve and/or be controlled Outcome: Not Met (add Reason)

## 2023-09-08 NOTE — Anesthesia Postprocedure Evaluation (Signed)
 Anesthesia Post Note  Patient: Alexander Donaldson  Procedure(s) Performed: ESOPHAGOGASTRODUODENOSCOPY (EGD) WITH PROPOFOL     Patient location during evaluation: PACU Anesthesia Type: MAC Level of consciousness: awake and alert Pain management: pain level controlled Vital Signs Assessment: post-procedure vital signs reviewed and stable Respiratory status: spontaneous breathing Cardiovascular status: stable Anesthetic complications: no   There were no known notable events for this encounter.  Last Vitals:  Vitals:   09/08/23 0900 09/08/23 1042  BP: (!) 124/58 (!) 137/55  Pulse: 72 72  Resp: 18 18  Temp: 36.7 C 36.7 C  SpO2: 100% 96%    Last Pain:  Vitals:   09/08/23 1042  TempSrc: Oral  PainSc:                  Lewie Loron

## 2023-09-08 NOTE — TOC Progression Note (Signed)
 Transition of Care [facility]) - Progression Note    Patient Details  Name: Alexander Donaldson MRN: [id number] Date of Birth: May 29, 1934  [facility] CM/SW Contact  Kermit Balo, RN Phone Number: 09/08/2023, 1:34 PM  Clinical Narrative:     CM received information through [facility], [facility] that his wheelchair is ordered and will be sent to his home. Pt can not receive any increase caregiver hours as he is already receiving the max of 25 hours as they pay his spouse to provide care in the home. Wife has been updated on both.  Wife interested in palliative care at home. MD also in agreement. CM has sent the referral to [facility] per wife request. [facility] will contact her for the first home visit. Family will provide transportation home when medically ready.   Expected Discharge Plan: Home/Self Care Barriers to Discharge: Continued Medical Work up  Expected Discharge Plan and Services   Discharge Planning Services: CM Consult   Living arrangements for the past 2 months: Single Family Home                 DME Arranged: Wheelchair manual   Date DME Agency Contacted: 09/05/23   Representative spoke with at DME Agency: [facility]             Social Determinants of Health (SDOH) Interventions SDOH Screenings   Food Insecurity: Patient Unable To Answer (09/04/2023)  Housing: Patient Unable To Answer (09/04/2023)  Transportation Needs: Patient Unable To Answer (09/04/2023)  Utilities: Patient Unable To Answer (09/04/2023)  Alcohol Screen: Low Risk  (07/02/2023)  Depression (PHQ2-9): Low Risk  (07/02/2023)  Financial Resource Strain: Low Risk  (07/02/2023)  Physical Activity: Inactive (07/02/2023)  Social Connections: Unknown (09/04/2023)  Recent Concern: Social Connections - Socially Isolated (07/02/2023)  Stress: Patient Unable To Answer (07/02/2023)  Tobacco Use: Low Risk  (09/08/2023)  Health Literacy: Patient Unable To Answer (07/02/2023)    Readmission  Risk Interventions     No data to display

## 2023-09-08 NOTE — Op Note (Signed)
 [facility] Patient Name: Alexander Donaldson Procedure Date : 09/08/2023 MRN: [id number] Attending MD: Starr Lake. Myrtie Donaldson , MD, [id number] Date of Birth: 03-05-1934 CSN: [id number] Age: 89 Admit Type: Inpatient Procedure:                Upper GI endoscopy Indications:              Iron deficiency anemia secondary to chronic blood                            loss, Heme positive stool Providers:                Sherilyn Cooter L. Myrtie Neither, MD, Glory Rosebush, RN, Alan Ripper,                            Technician Referring MD:             [facility] Medicines:                Monitored Anesthesia Care Complications:            No immediate complications. Estimated Blood Loss:     Estimated blood loss: none. Procedure:                Pre-Anesthesia Assessment:                           - Prior to the procedure, a History and Physical                            was performed, and patient medications and                            allergies were reviewed. The patient's tolerance of                            previous anesthesia was also reviewed. The risks                            and benefits of the procedure and the sedation                            options and risks were discussed with the patient.                            All questions were answered, and informed consent                            was obtained. Prior Anticoagulants: The patient has                            taken Eliquis (apixaban), last dose was 4 days                            prior to procedure. ASA Grade Assessment: IV - A  patient with severe systemic disease that is a                            constant threat to life. After reviewing the risks                            and benefits, the patient was deemed in                            satisfactory condition to undergo the procedure.                           After obtaining informed consent, the endoscope was                             passed under direct vision. Throughout the                            procedure, the patient's blood pressure, pulse, and                            oxygen saturations were monitored continuously. The                            [id number]-[id number] ([id number] Olympus endoscope was introduced                            through the mouth, and advanced to the second part                            of duodenum. The upper GI endoscopy was                            accomplished without difficulty. The patient                            tolerated the procedure well. Scope In: Scope Out: Findings:      The esophagus was normal.      The stomach was normal.      The cardia and gastric fundus were normal on retroflexion.      The examined duodenum was normal. Impression:               - Normal esophagus.                           - Normal stomach.                           - Normal examined duodenum.                           - No specimens collected. Recommendation:           - Return patient to hospital ward for ongoing care.                           -  Resume regular diet.                           - Recommend that patient, neurologist and internal                            medical team discuss risks/benefits of resuming                            aspirin and/or OAC in this patient.                           Source of anemia unknown, and colonoscopy is not                            planned due to patient's condition (see prior notes                            regarding this).                           However, at least we now know there is not an upper                            GI finding such as an ulcer that would present a                            risk of brisk upper GI bleeding if aspirin and/or                            OAC are resumed.                           He will need oral iron and close follow up of his                            hemoglobin and iron levels with PCP after discharge.                            - The findings and recommendations were discussed                            with the patient's family (wife).                           - Inpatient GI service signing off. Procedure Code(s):        --- Professional ---                           [phone], Esophagogastroduodenoscopy, flexible,                            transoral; diagnostic, including collection of  specimen(s) by brushing or washing, when performed                            (separate procedure) Diagnosis Code(s):        --- Professional ---                           D50.0, Iron deficiency anemia secondary to blood                            loss (chronic)                           R19.5, Other fecal abnormalities CPT copyright 2022 American Medical Association. All rights reserved. The codes documented in this report are preliminary and upon coder review may  be revised to meet current compliance requirements. Alexander Trudel L. Myrtie Neither, MD 09/08/2023 8:24:14 AM This report has been signed electronically. Number of Addenda: 0

## 2023-09-08 NOTE — Transfer of Care (Signed)
 Immediate Anesthesia Transfer of Care Note  Patient: Alexander Donaldson  Procedure(s) Performed: ESOPHAGOGASTRODUODENOSCOPY (EGD) WITH PROPOFOL  Patient Location: PACU  Anesthesia Type:MAC  Level of Consciousness: awake, alert , and oriented  Airway & Oxygen Therapy: Patient Spontanous Breathing and Patient connected to nasal cannula oxygen  Post-op Assessment: Report given to RN and Post -op Vital signs reviewed and stable  Post vital signs: Reviewed and stable  Last Vitals:  Vitals Value Taken Time  BP 92/44 09/08/23 0823  Temp 36.9 C 09/08/23 0823  Pulse 70 09/08/23 0827  Resp 18 09/08/23 0827  SpO2 100 % 09/08/23 0827  Vitals shown include unfiled device data.  Last Pain:  Vitals:   09/08/23 0823  TempSrc:   PainSc: Asleep         Complications: No notable events documented.

## 2023-09-09 ENCOUNTER — Other Ambulatory Visit (HOSPITAL_COMMUNITY): Payer: Self-pay

## 2023-09-09 DIAGNOSIS — K922 Gastrointestinal hemorrhage, unspecified: Secondary | ICD-10-CM | POA: Diagnosis not present

## 2023-09-09 LAB — CBC WITH DIFFERENTIAL/PLATELET
Abs Immature Granulocytes: 0.01 10*3/uL (ref 0.00–0.07)
Basophils Absolute: 0 10*3/uL (ref 0.0–0.1)
Basophils Relative: 1 %
Eosinophils Absolute: 0.2 10*3/uL (ref 0.0–0.5)
Eosinophils Relative: 4 %
HCT: 25.7 % — ABNORMAL LOW (ref 39.0–52.0)
Hemoglobin: 8.7 g/dL — ABNORMAL LOW (ref 13.0–17.0)
Immature Granulocytes: 0 %
Lymphocytes Relative: 30 %
Lymphs Abs: 1.6 10*3/uL (ref 0.7–4.0)
MCH: 31.2 pg (ref 26.0–34.0)
MCHC: 33.9 g/dL (ref 30.0–36.0)
MCV: 92.1 fL (ref 80.0–100.0)
Monocytes Absolute: 0.5 10*3/uL (ref 0.1–1.0)
Monocytes Relative: 9 %
Neutro Abs: 3 10*3/uL (ref 1.7–7.7)
Neutrophils Relative %: 56 %
Platelets: 221 10*3/uL (ref 150–400)
RBC: 2.79 MIL/uL — ABNORMAL LOW (ref 4.22–5.81)
RDW: 13.5 % (ref 11.5–15.5)
WBC: 5.3 10*3/uL (ref 4.0–10.5)
nRBC: 0 % (ref 0.0–0.2)

## 2023-09-09 LAB — BASIC METABOLIC PANEL
Anion gap: 9 (ref 5–15)
BUN: 29 mg/dL — ABNORMAL HIGH (ref 8–23)
CO2: 25 mmol/L (ref 22–32)
Calcium: 8.6 mg/dL — ABNORMAL LOW (ref 8.9–10.3)
Chloride: 106 mmol/L (ref 98–111)
Creatinine, Ser: 1.67 mg/dL — ABNORMAL HIGH (ref 0.61–1.24)
GFR, Estimated: 39 mL/min — ABNORMAL LOW (ref >60)
Glucose, Bld: 118 mg/dL — ABNORMAL HIGH (ref 70–99)
Potassium: 3.7 mmol/L (ref 3.5–5.1)
Sodium: 140 mmol/L (ref 135–145)

## 2023-09-09 MED ORDER — PANTOPRAZOLE SODIUM 40 MG PO TBEC
40.0000 mg | DELAYED_RELEASE_TABLET | Freq: Two times a day (BID) | ORAL | 11 refills | Status: DC
  Filled 2023-09-09: qty 60, 30d supply, fill #0

## 2023-09-09 MED ORDER — AMLODIPINE BESYLATE 10 MG PO TABS
10.0000 mg | ORAL_TABLET | Freq: Every day | ORAL | 3 refills | Status: DC
  Filled 2023-09-09: qty 30, 30d supply, fill #0

## 2023-09-09 NOTE — Plan of Care (Signed)
 Pt alert and oriented x 3. Iv is patent and saline locked. Continent of bowel. Denies any pain. Wife at bedside. Discharge instructions completed with pt and family. All questions and concerns answered. Pt stable upon discharge.  Problem: Education: Goal: Ability to describe self-care measures that may prevent or decrease complications (Diabetes Survival Skills Education) will improve Outcome: Adequate for Discharge Goal: Individualized Educational Video(s) Outcome: Adequate for Discharge   Problem: Coping: Goal: Ability to adjust to condition or change in health will improve Outcome: Adequate for Discharge   Problem: Fluid Volume: Goal: Ability to maintain a balanced intake and output will improve Outcome: Adequate for Discharge   Problem: Health Behavior/Discharge Planning: Goal: Ability to identify and utilize available resources and services will improve Outcome: Adequate for Discharge Goal: Ability to manage health-related needs will improve Outcome: Adequate for Discharge   Problem: Metabolic: Goal: Ability to maintain appropriate glucose levels will improve Outcome: Adequate for Discharge   Problem: Nutritional: Goal: Maintenance of adequate nutrition will improve Outcome: Adequate for Discharge Goal: Progress toward achieving an optimal weight will improve Outcome: Adequate for Discharge   Problem: Skin Integrity: Goal: Risk for impaired skin integrity will decrease Outcome: Adequate for Discharge   Problem: Tissue Perfusion: Goal: Adequacy of tissue perfusion will improve Outcome: Adequate for Discharge   Problem: Education: Goal: Knowledge of General Education information will improve Description: Including pain rating scale, medication(s)/side effects and non-pharmacologic comfort measures Outcome: Adequate for Discharge   Problem: Health Behavior/Discharge Planning: Goal: Ability to manage health-related needs will improve Outcome: Adequate for Discharge    Problem: Clinical Measurements: Goal: Ability to maintain clinical measurements within normal limits will improve Outcome: Adequate for Discharge Goal: Will remain free from infection Outcome: Adequate for Discharge Goal: Diagnostic test results will improve Outcome: Adequate for Discharge Goal: Respiratory complications will improve Outcome: Adequate for Discharge Goal: Cardiovascular complication will be avoided Outcome: Adequate for Discharge   Problem: Activity: Goal: Risk for activity intolerance will decrease Outcome: Adequate for Discharge   Problem: Nutrition: Goal: Adequate nutrition will be maintained Outcome: Adequate for Discharge   Problem: Coping: Goal: Level of anxiety will decrease Outcome: Adequate for Discharge   Problem: Elimination: Goal: Will not experience complications related to bowel motility Outcome: Adequate for Discharge Goal: Will not experience complications related to urinary retention Outcome: Adequate for Discharge   Problem: Pain Managment: Goal: General experience of comfort will improve and/or be controlled Outcome: Adequate for Discharge   Problem: Safety: Goal: Ability to remain free from injury will improve Outcome: Adequate for Discharge   Problem: Skin Integrity: Goal: Risk for impaired skin integrity will decrease Outcome: Adequate for Discharge

## 2023-09-09 NOTE — Plan of Care (Signed)
 Calm and cooperative, comfortably sleeping throughout this time.   Problem: Education: Goal: Ability to describe self-care measures that may prevent or decrease complications (Diabetes Survival Skills Education) will improve Outcome: Progressing   Problem: Coping: Goal: Ability to adjust to condition or change in health will improve Outcome: Progressing   Problem: Nutritional: Goal: Maintenance of adequate nutrition will improve Outcome: Progressing   Problem: Skin Integrity: Goal: Risk for impaired skin integrity will decrease Outcome: Progressing   Problem: Clinical Measurements: Goal: Will remain free from infection Outcome: Progressing   Problem: Safety: Goal: Ability to remain free from injury will improve Outcome: Progressing

## 2023-09-09 NOTE — Discharge Summary (Signed)
 Physician Discharge Summary  Alexander Donaldson BJY:[id number] DOB: 09-21-1933 DOA: 09/03/2023  PCP: [facility]  Admit date: 09/03/2023 Discharge date: 09/09/2023  Time spent: 40 minutes  Recommendations for Outpatient Follow-up:  Acute stroke will need outpatient follow-up not a candidate for DOAC or antiplatelet given undefined bleed please check hemoglobin in a week Palliative care to check in on patient  Discharge Diagnoses:  MAIN problem for hospitalization   Acute stroke Questionable lower GI bleed requiring transfusion  Please see below for itemized issues addressed in HOpsital- refer to other progress notes for clarity if needed  Discharge Condition: Guarded  Diet recommendation: Regular  Filed Weights   09/05/23 0622 09/07/23 0500 09/09/23 0500  Weight: 68.9 kg 66 kg 66.4 kg    History of present illness:  89 year old black male-at baseline needs help with all ADLs-unable to fix himself a plate at meals and needs assistance getting to the restroom Known atrial fibrillation CHADVASC >5 on Eliquis follows with Dr. Sharyn Lull Recent hospitalization 12/20 through through 12/24 2024 with small non-Q wave MI-on aspirin Prior CAD with silent MI status post PTCA 2003 Remote tobacco DM TY 2 History of underlying spinal stenosis and sciatica and left foot drop HTN HLD Dementia with sundowning   2/23 [facility] from home with facial droop slurred speech bilateral weakness with shuffling gait and difficulty using right arm-wife called 911 CT head showed acute right pontine CVA While in ED documented dark tarry stool-hemoglobin dropped 4 points since several months from baseline of 12.8 Hemoglobin 8.1 WBC 5.5 platelet 211, sodium 137 potassium 4.1 BUN/creatinine 34/1.7 LFTs relatively normal BNP 174 2/24  GI consulted-note no prior GI workup-UGI contemplated 2/28 upper GI endoscopy showed normal esophagus with no evidences of bleeding   Plan   Acute pontine CVA Discussed  with wife in detail on 2/28 and she has elected to not go forward with DOAC or aspirin and prefers to hold off on further workup of bleeding issues as below--she understands clearly the risk of recurrent stroke and this happening again and has elected not to pursue further GI workup and understand the risks of the recurrences Secondary prevention with atorvastatin 80  Possible upper GI bleed-upper GI scope as above Upper GI scope negative-change Protonix to 40 2 times a day at discharge No further bleeding during hospital stay  Patient's wife does not want to go through with colonoscopy as it would be onerous on patient to cooperate with prep Patient has had no bleeding but we will not challenge with DOAC or aspirin going forward as above   Acute blood loss anemia Transfused 1 unit PRBC on arrival 2/2 bleeding-iron 41, saturation ratios 14--IV iron given 2/26 Need outpatient iron levels as well as hemoglobin levels   Chronic systolic diastolic HF Resumed amlodipine at dose 10, ---losartan 100 held this admission Seems euvolemic and was not on PTA Lasix Echo this admission EF 60-65% grade 1 diastolic dysfunction normal valves   Paroxysmal A-fib CHADVASC >4 Continue amiodarone 200 daily--see discussion regarding anticoagulation   AKI superimposed on CKD 3B-baseline creatinine between 1.3 and 1.5 Meds being adjusted as above-follow labs periodically Losartan discontinued this hospital stay   DM TY 2 A1c 5.9-not on PTA meds CBGs ranging in the low 100 range and overall below 160 --stop SSI and CBG as irksome to patient   Dementia Namenda 10 twice daily and gently reorient-this has been going on since 2020 and wife wishes to take him home with her  Discharge Exam: Vitals:   09/09/23 0353 09/09/23 1000  BP: (!) 140/62   Pulse: 65   Resp: 16   Temp: 97.6 F (36.4 C) 100.1 F (37.8 C)  SpO2: 97%     Subj on day of d/c   Remains pleasantly confused in no distress Sitting in  the bed did have some breakfast no chest pain no fever Moves upper extremities quite hard of hearing does not always follow commands  Discharge Instructions    Allergies as of 09/09/2023       Reactions   Lisinopril    Other reaction(s): Cough   Metformin Diarrhea        Medication List     STOP taking these medications    apixaban 5 MG Tabs tablet Commonly known as: ELIQUIS   aspirin 81 MG tablet   diclofenac sodium 1 % Gel Commonly known as: VOLTAREN   losartan 100 MG tablet Commonly known as: COZAAR       TAKE these medications    amiodarone 200 MG tablet Commonly known as: PACERONE Take 1 tablet (200 mg total) by mouth daily.   amLODipine 10 MG tablet Commonly known as: NORVASC Take 1 tablet (10 mg total) by mouth daily. Start taking on: September 10, 2023 What changed:  medication strength how much to take   Atorvastatin Calcium 20 MG/5ML Susp Take 10 mLs by mouth daily.   Memantine HCl 10 MG/5ML Soln Take 5 mLs by mouth 2 (two) times daily at 10 AM and 5 PM.   multivitamin tablet Take 1 tablet by mouth daily.   nitroGLYCERIN 0.4 MG SL tablet Commonly known as: NITROSTAT Place 1 tablet (0.4 mg total) under the tongue every 5 (five) minutes x 3 doses as needed for chest pain.   pantoprazole 40 MG tablet Commonly known as: PROTONIX Take 1 tablet (40 mg total) by mouth 2 (two) times daily.   polyethylene glycol 17 g packet Commonly known as: MIRALAX / GLYCOLAX Take 17 g by mouth daily as needed for mild constipation.               Durable Medical Equipment  (From admission, onward)           Start     Ordered   09/05/23 1632  For home use only DME lightweight manual wheelchair with seat cushion  Once       Comments: Patient suffers from weakness which impairs their ability to perform daily activities like bathing, dressing, and grooming in the home.  A walker will not resolve  issue with performing activities of daily living. A  wheelchair will allow patient to safely perform daily activities. Patient is not able to propel themselves in the home using a standard weight wheelchair due to endurance. Patient can self propel in the lightweight wheelchair. Length of need 6 months . Accessories: elevating leg rests (ELRs), wheel locks, extensions and anti-tippers.   09/05/23 1631           Allergies  Allergen Reactions   Lisinopril     Other reaction(s): Cough   Metformin Diarrhea    Follow-up Information     [facility] Follow up.   Specialty: PALLIATIVE CARE Why: Palliative care will contact you for the first home visit. Contact information: [address] [facility] [address] [phone]                 The results of significant diagnostics from this hospitalization (including imaging, microbiology,  ancillary and laboratory) are listed below for reference.    Significant Diagnostic Studies: MR ANGIO HEAD WO CONTRAST Result Date: 09/06/2023 CLINICAL DATA:  Follow-up examination for stroke. EXAM: MRA HEAD WITHOUT CONTRAST TECHNIQUE: Angiographic images of the Circle of Willis were acquired using MRA technique without intravenous contrast. COMPARISON:  MRI from 09/04/2023. FINDINGS: Anterior circulation: Examination moderately to severely degraded by motion artifact. Both internal carotid arteries are patent through the siphons without visible stenosis. There is question of a 2-3 mm outpouching extending superiorly from the left ICA terminus (series 5, image 132), which could reflect a small aneurysm. Difficult to be certain given motion degradation on this exam. A1 segments patent bilaterally. Grossly normal anterior communicating artery complex. Anterior cerebral arteries patent without stenosis. No M1 stenosis or occlusion. Distal MCA branches perfused and fairly symmetric. Posterior circulation: Both V4 segments patent without stenosis. Right vertebral artery dominant.  Both PICA patent at their origins. Basilar patent without stenosis. Superior cerebral arteries patent bilaterally. Both PCAs primarily supplied via the basilar. PCAs are patent to their distal aspects without visible stenosis. Anatomic variants: None significant. Other: None. IMPRESSION: 1. Motion degraded exam. 2. Negative intracranial MRA for large vessel occlusion. No hemodynamically significant or correctable stenosis. 3. Question 2-3 mm outpouching extending superiorly from the left ICA terminus, suspicious for a small aneurysm. Difficult to be certain of this finding given motion degradation on this exam. Attention at follow-up recommended. Electronically Signed   By: Rise Mu M.D.   On: 09/06/2023 18:46   VAS US CAROTID Result Date: 09/06/2023 Carotid Arterial Duplex Study Patient Name:  JAMAUL HEIST  Date of Exam:   09/06/2023 Medical Rec #: [id number]        Accession #:    [id number] Date of Birth: [birth year]         Patient Gender: M Patient Age:   34 years Exam Location:  [facility] Procedure:      VAS US CAROTID Referring Phys: Gevena Mart --------------------------------------------------------------------------------  Indications:       CVA. Risk Factors:      Hypertension, Diabetes, no history of smoking, prior MI,                    coronary artery disease. Other Factors:     Afib, CHF. Comparison Study:  No previous exams Performing Technologist: Jody Hill RVT, RDMS  Examination Guidelines: A complete evaluation includes B-mode imaging, spectral Doppler, color Doppler, and power Doppler as needed of all accessible portions of each vessel. Bilateral testing is considered an integral part of a complete examination. Limited examinations for reoccurring indications may be performed as noted.  Right Carotid Findings: +----------+--------+--------+--------+------------------+------------------+           PSV cm/sEDV cm/sStenosisPlaque DescriptionComments            +----------+--------+--------+--------+------------------+------------------+ CCA Prox  61      10              calcific          intimal thickening +----------+--------+--------+--------+------------------+------------------+ CCA Distal55      9               calcific          intimal thickening +----------+--------+--------+--------+------------------+------------------+ ICA Prox  96      17      1-39%   smooth            Shadowing          +----------+--------+--------+--------+------------------+------------------+ ICA Distal44  9                                                    +----------+--------+--------+--------+------------------+------------------+ ECA       208     2       >50%    calcific          shadowing          +----------+--------+--------+--------+------------------+------------------+ +----------+--------+-------+----------------+-------------------+           PSV cm/sEDV cmsDescribe        Arm Pressure (mmHG) +----------+--------+-------+----------------+-------------------+ [id number]             Multiphasic, WNL                    +----------+--------+-------+----------------+-------------------+ +---------+--------+--+--------+-+---------+ VertebralPSV cm/s49EDV cm/s9Antegrade +---------+--------+--+--------+-+---------+  Left Carotid Findings: +----------+-------+-------+--------+---------------------------------+--------+           PSV    EDV    StenosisPlaque Description               Comments           cm/s   cm/s                                                     +----------+-------+-------+--------+---------------------------------+--------+ CCA Prox  84     12             calcific                                  +----------+-------+-------+--------+---------------------------------+--------+ CCA Distal73     11                                                        +----------+-------+-------+--------+---------------------------------+--------+ ICA Prox  95     16     1-39%   irregular, heterogenous and                                               calcific                                  +----------+-------+-------+--------+---------------------------------+--------+ ICA Distal59     12                                                       +----------+-------+-------+--------+---------------------------------+--------+ ECA       130    7              calcific                                  +----------+-------+-------+--------+---------------------------------+--------+ +----------+--------+--------+---------+-------------------+  PSV cm/sEDV cm/sDescribe Arm Pressure (mmHG) +----------+--------+--------+---------+-------------------+ Subclavian213             Turbulent                    +----------+--------+--------+---------+-------------------+ +---------+--------+--+--------+-+---------+ VertebralPSV cm/s40EDV cm/s9Antegrade +---------+--------+--+--------+-+---------+   Summary: Right Carotid: Velocities in the right ICA are consistent with a 1-39% stenosis. Left Carotid: Velocities in the left ICA are consistent with a 1-39% stenosis.               Unstable plaque seen in proximal ICA. Vertebrals:  Bilateral vertebral arteries demonstrate antegrade flow. Subclavians: Left subclavian artery flow was disturbed. Normal flow hemodynamics              were seen in the right subclavian artery. *See table(s) above for measurements and observations.  Electronically signed by Coral Else MD on 09/06/2023 at 4:45:04 PM.    Final    ECHOCARDIOGRAM COMPLETE Result Date: 09/05/2023    ECHOCARDIOGRAM REPORT   Patient Name:   MARQUIZE SEIB Date of Exam: 09/05/2023 Medical Rec #:  [id number]       Height:       64.0 in Accession #:    [id number]      Weight:       151.9 lb Date of Birth:  1934-04-17        BSA:          1.740  m Patient Age:    89 years        BP:           175/66 mmHg Patient Gender: M               HR:           62 bpm. Exam Location:  Inpatient Procedure: 2D Echo, Cardiac Doppler and Color Doppler (Both Spectral and Color            Flow Doppler were utilized during procedure). Indications:    Stroke I63.9  History:        Patient has prior history of Echocardiogram examinations, most                 recent 07/02/2023. Previous Myocardial Infarction and CAD, TIA,                 Arrythmias:Atrial Fibrillation; Risk Factors:Diabetes.  Sonographer:    Lucendia Herrlich RCS Referring Phys: [id number] Kateri Mc LATIF [facility] IMPRESSIONS  1. Left ventricular ejection fraction, by estimation, is 60 to 65%. The left ventricle has normal function. The left ventricle has no regional wall motion abnormalities. There is mild concentric left ventricular hypertrophy of the basal-septal segment. Left ventricular diastolic parameters are consistent with Grade I diastolic dysfunction (impaired relaxation).  2. Right ventricular systolic function is normal. The right ventricular size is normal.  3. Left atrial size was mildly dilated.  4. The mitral valve is normal in structure. Trivial mitral valve regurgitation. No evidence of mitral stenosis.  5. The aortic valve is moderately calcified. The right and left coronary cusps are fused. The aortic valve is tricuspid. There is mild calcification of the aortic valve. Aortic valve regurgitation is trivial. Mild aortic valve stenosis. FINDINGS  Left Ventricle: Left ventricular ejection fraction, by estimation, is 60 to 65%. The left ventricle has normal function. The left ventricle has no regional wall motion abnormalities. Strain imaging was not performed. The left ventricular internal cavity  size was normal in size. There is mild concentric left ventricular hypertrophy  of the basal-septal segment. Left ventricular diastolic parameters are consistent with Grade I diastolic dysfunction (impaired  relaxation). Right Ventricle: The right ventricular size is normal. No increase in right ventricular wall thickness. Right ventricular systolic function is normal. Left Atrium: Left atrial size was mildly dilated. Right Atrium: Right atrial size was normal in size. Pericardium: There is no evidence of pericardial effusion. Mitral Valve: The mitral valve is normal in structure. Mild mitral annular calcification. Trivial mitral valve regurgitation. No evidence of mitral valve stenosis. Tricuspid Valve: The tricuspid valve is normal in structure. Tricuspid valve regurgitation is trivial. No evidence of tricuspid stenosis. Aortic Valve: The aortic valve is moderately calcified. The right and left coronary cusps are fused. The aortic valve is tricuspid. There is mild calcification of the aortic valve. Aortic valve regurgitation is trivial. Mild aortic stenosis is present. Aortic valve mean gradient measures 5.0 mmHg. Aortic valve peak gradient measures 10.0 mmHg. Aortic valve area, by VTI measures 1.50 cm. Pulmonic Valve: The pulmonic valve was normal in structure. Pulmonic valve regurgitation is trivial. No evidence of pulmonic stenosis. Aorta: The aortic root is normal in size and structure. Venous: The inferior vena cava was not well visualized. IAS/Shunts: No atrial level shunt detected by color flow Doppler. Additional Comments: 3D imaging was not performed.  LEFT VENTRICLE PLAX 2D LVIDd:         3.30 cm     Diastology LVIDs:         2.60 cm     LV e' medial:    4.57 cm/s LV PW:         0.90 cm     LV E/e' medial:  10.2 LV IVS:        1.20 cm     LV e' lateral:   5.98 cm/s LVOT diam:     2.10 cm     LV E/e' lateral: 7.8 LV SV:         50 LV SV Index:   29 LVOT Area:     3.46 cm  LV Volumes (MOD) LV vol d, MOD A2C: 80.8 ml LV vol d, MOD A4C: 64.8 ml LV vol s, MOD A2C: 41.9 ml LV vol s, MOD A4C: 35.9 ml LV SV MOD A2C:     38.9 ml LV SV MOD A4C:     64.8 ml LV SV MOD BP:      36.1 ml RIGHT VENTRICLE RV S prime:      14.30 cm/s TAPSE (M-mode): 1.0 cm LEFT ATRIUM             Index        RIGHT ATRIUM          Index LA diam:        3.90 cm 2.24 cm/m   RA Area:     8.47 cm LA Vol (A2C):   51.5 ml 29.59 ml/m  RA Volume:   12.70 ml 7.30 ml/m LA Vol (A4C):   48.4 ml 27.81 ml/m LA Biplane Vol: 49.2 ml 28.27 ml/m  AORTIC VALVE AV Area (Vmax):    1.63 cm AV Area (Vmean):   1.45 cm AV Area (VTI):     1.50 cm AV Vmax:           158.00 cm/s AV Vmean:          108.000 cm/s AV VTI:            0.333 m AV Peak Grad:      10.0 mmHg AV  Mean Grad:      5.0 mmHg LVOT Vmax:         74.15 cm/s LVOT Vmean:        45.200 cm/s LVOT VTI:          0.144 m LVOT/AV VTI ratio: 0.43  AORTA Ao Root diam: 3.60 cm Ao Asc diam:  3.50 cm MITRAL VALVE               TRICUSPID VALVE MV Area (PHT): 2.20 cm    TR Peak grad:   18.5 mmHg MV Decel Time: 345 msec    TR Vmax:        215.00 cm/s MV E velocity: 46.60 cm/s MV A velocity: 95.40 cm/s  SHUNTS MV E/A ratio:  0.49        Systemic VTI:  0.14 m                            Systemic Diam: 2.10 cm Arvilla Meres MD Electronically signed by Arvilla Meres MD Signature Date/Time: 09/05/2023/2:08:26 PM    Final    MR Brain Wo Contrast (neuro protocol) Result Date: 09/04/2023 CLINICAL DATA:  Provided history: Transient ischemic attack (TIA). Additional history provided: Facial droop, slurred speech, weakness, confusion, agitation. EXAM: MRI HEAD WITHOUT CONTRAST TECHNIQUE: Multiplanar, multiecho pulse sequences of the brain and surrounding structures were obtained without intravenous contrast. COMPARISON:  Head CT 09/03/2023.  Brain MRI 03/31/2020. FINDINGS: Significantly motion degraded examination, limiting evaluation. Most notably, the axial diffusion-weighted sequence is severely motion degraded, the axial SWI sequence is severely motion degraded and the axial FLAIR sequence is severely motion degraded. Within these limitations, findings are as follows. Brain: Moderate-to-advanced generalized cerebral  atrophy. Commensurate prominence of the ventricles sulci. Mild cerebellar atrophy. 16 mm acute infarct within the right aspect of the pons (for instance as seen on series 3, image 16) (series 4, image 17). Multifocal T2 FLAIR hyperintense signal abnormality within the cerebral white matter, nonspecific but compatible with mild-to-moderate chronic small vessel ischemic disease. Chronic lacunar infarct within the left thalamus. No evidence of an intracranial mass. No extra-axial fluid collection. No midline shift. Vascular: Maintained flow voids within the proximal large arterial vessels. Skull and upper cervical spine: No focal worrisome marrow lesion. Ligamentous hypertrophy about the dens. Incompletely assessed cervical spondylosis. Sinuses/Orbits: No mass or acute finding within the imaged orbits. Mild mucosal thickening within a posterior right ethmoid air cell. IMPRESSION: 1. Significantly motion degraded examination. Within this limitation, findings are as follows. 2. 16 mm acute infarct within the right aspect of the pons. 3. Mild-to-moderate chronic small vessel ischemic changes within the cerebral white matter. 4. Chronic left thalamic lacunar infarct. 5. Moderate-to-advanced generalized cerebral atrophy. 6. Mild cerebellar atrophy. 7. Minor right ethmoid sinus disease. Electronically Signed   By: Jackey Loge D.O.   On: 09/04/2023 10:21   CT HEAD WO CONTRAST ( ) Result Date: 09/03/2023 CLINICAL DATA:  Right lower extremity weakness EXAM: CT HEAD WITHOUT CONTRAST TECHNIQUE: Contiguous axial images were obtained from the base of the skull through the vertex without intravenous contrast. RADIATION DOSE REDUCTION: This exam was performed according to the departmental dose-optimization program which includes automated exposure control, adjustment of the mA and/or kV according to patient size and/or use of iterative reconstruction technique. COMPARISON:  07/02/2023 FINDINGS: Brain: There is no mass,  hemorrhage or extra-axial collection. There is generalized atrophy without lobar predilection. Hypodensity of the white matter is most commonly associated with chronic  microvascular disease. Vascular: No hyperdense vessel or unexpected vascular calcification. Skull: The visualized skull base, calvarium and extracranial soft tissues are normal. Sinuses/Orbits: No fluid levels or advanced mucosal thickening of the visualized paranasal sinuses. No mastoid or middle ear effusion. Normal orbits. Other: None. IMPRESSION: 1. No acute intracranial abnormality. 2. Generalized atrophy and findings of chronic microvascular disease. Electronically Signed   By: Deatra Robinson M.D.   On: 09/03/2023 20:08    Microbiology: Recent Results (from the past 240 hours)  Blood culture (routine x 2)     Status: None   Collection Time: 09/03/23  6:57 PM   Specimen: BLOOD LEFT HAND  Result Value Ref Range Status   Specimen Description BLOOD LEFT HAND  Final   Special Requests   Final    BOTTLES DRAWN AEROBIC AND ANAEROBIC Blood Culture results may not be optimal due to an inadequate volume of blood received in culture bottles   Culture   Final    NO GROWTH 5 DAYS Performed at [facility], [address], [place], [place] [address]    Report Status 09/08/2023 FINAL  Final  Blood culture (routine x 2)     Status: None   Collection Time: 09/03/23  7:02 PM   Specimen: BLOOD  Result Value Ref Range Status   Specimen Description BLOOD RIGHT ANTECUBITAL  Final   Special Requests   Final    BOTTLES DRAWN AEROBIC AND ANAEROBIC Blood Culture adequate volume   Culture   Final    NO GROWTH 5 DAYS Performed at [facility], [address] [place], [place] [address]    Report Status 09/08/2023 FINAL  Final     Labs: Basic Metabolic Panel: Recent Labs  Lab 09/04/23 1133 09/05/23 0549 09/06/23 0625 09/07/23 0712 09/08/23 0958 09/09/23 0652  NA 138 136 137 136 139 140  K 3.4* 4.0 3.8 4.3 3.9 3.7  CL 107  108 105 107 108 106  CO2 25 22 26 22 23 25   GLUCOSE 124* 109* 142* 124* 128* 118*  BUN 23 18 22 23  30* 29*  CREATININE 1.27* 1.09 1.55* 1.68* 1.97* 1.67*  CALCIUM 8.2* 8.0* 8.5* 8.6* 8.7* 8.6*  MG 1.9  --  2.1  --   --   --   PHOS 3.3  --  3.5  --   --   --    Liver Function Tests: Recent Labs  Lab 09/03/23 1848 09/04/23 1133 09/06/23 0625  AST 19 16 17   ALT 16 14 14   ALKPHOS 47 42 51  BILITOT 0.7 1.1 1.0  PROT 6.3* 5.3* 6.1*  ALBUMIN 3.4* 2.8* 3.2*   No results for input(s): "LIPASE", "AMYLASE" in the last 168 hours. No results for input(s): "AMMONIA" in the last 168 hours. CBC: Recent Labs  Lab 09/05/23 0549 09/06/23 0625 09/07/23 0712 09/08/23 0958 09/09/23 0652  WBC 6.0 6.4 6.6 5.8 5.3  NEUTROABS 3.5 3.6 3.6 3.5 3.0  HGB 9.5* 9.3* 9.6* 8.9* 8.7*  HCT 26.4* 27.1* 28.5* 26.4* 25.7*  MCV 90.1 90.6 92.2 92.0 92.1  PLT 194 200 219 226 221   Cardiac Enzymes: No results for input(s): "CKTOTAL", "CKMB", "CKMBINDEX", "TROPONINI" in the last 168 hours. BNP: BNP (last 3 results) Recent Labs    09/04/23 1133  BNP 174.6*    ProBNP (last 3 results) No results for input(s): "PROBNP" in the last 8760 hours.  CBG: Recent Labs  Lab 09/06/23 1154 09/06/23 1721 09/07/23 0000 09/07/23 1226 09/08/23 0845  GLUCAP  143* 173* 166* 132* 115*    Signed:  Rhetta Mura MD   [facility] 09/09/2023, 10:30 AM

## 2023-09-10 ENCOUNTER — Observation Stay (HOSPITAL_COMMUNITY)

## 2023-09-10 ENCOUNTER — Emergency Department (HOSPITAL_COMMUNITY)

## 2023-09-10 ENCOUNTER — Encounter (HOSPITAL_COMMUNITY): Payer: Self-pay | Admitting: Internal Medicine

## 2023-09-10 ENCOUNTER — Inpatient Hospital Stay (HOSPITAL_COMMUNITY)
Admission: EM | Admit: 2023-09-10 | Discharge: 2023-09-13 | DRG: 065 | Disposition: A | Discharge status: Skilled Nursing Facility | Attending: Internal Medicine | Admitting: Internal Medicine

## 2023-09-10 DIAGNOSIS — F0280 Dementia in other diseases classified elsewhere without behavioral disturbance: Secondary | ICD-10-CM | POA: Diagnosis present

## 2023-09-10 DIAGNOSIS — I130 Hypertensive heart and chronic kidney disease with heart failure and stage 1 through stage 4 chronic kidney disease, or unspecified chronic kidney disease: Secondary | ICD-10-CM | POA: Diagnosis present

## 2023-09-10 DIAGNOSIS — N1832 Chronic kidney disease, stage 3b: Secondary | ICD-10-CM | POA: Diagnosis present

## 2023-09-10 DIAGNOSIS — N179 Acute kidney failure, unspecified: Secondary | ICD-10-CM | POA: Diagnosis not present

## 2023-09-10 DIAGNOSIS — I639 Cerebral infarction, unspecified: Secondary | ICD-10-CM | POA: Diagnosis not present

## 2023-09-10 DIAGNOSIS — E1122 Type 2 diabetes mellitus with diabetic chronic kidney disease: Secondary | ICD-10-CM | POA: Diagnosis present

## 2023-09-10 DIAGNOSIS — Z515 Encounter for palliative care: Secondary | ICD-10-CM

## 2023-09-10 DIAGNOSIS — E876 Hypokalemia: Secondary | ICD-10-CM | POA: Diagnosis present

## 2023-09-10 DIAGNOSIS — R531 Weakness: Secondary | ICD-10-CM | POA: Diagnosis not present

## 2023-09-10 DIAGNOSIS — E8809 Other disorders of plasma-protein metabolism, not elsewhere classified: Secondary | ICD-10-CM | POA: Diagnosis present

## 2023-09-10 DIAGNOSIS — I252 Old myocardial infarction: Secondary | ICD-10-CM

## 2023-09-10 DIAGNOSIS — Z8673 Personal history of transient ischemic attack (TIA), and cerebral infarction without residual deficits: Secondary | ICD-10-CM

## 2023-09-10 DIAGNOSIS — R270 Ataxia, unspecified: Secondary | ICD-10-CM | POA: Diagnosis present

## 2023-09-10 DIAGNOSIS — I63512 Cerebral infarction due to unspecified occlusion or stenosis of left middle cerebral artery: Secondary | ICD-10-CM | POA: Diagnosis not present

## 2023-09-10 DIAGNOSIS — G309 Alzheimer's disease, unspecified: Secondary | ICD-10-CM | POA: Diagnosis present

## 2023-09-10 DIAGNOSIS — I63412 Cerebral infarction due to embolism of left middle cerebral artery: Secondary | ICD-10-CM

## 2023-09-10 DIAGNOSIS — I5032 Chronic diastolic (congestive) heart failure: Secondary | ICD-10-CM | POA: Diagnosis not present

## 2023-09-10 DIAGNOSIS — I480 Paroxysmal atrial fibrillation: Secondary | ICD-10-CM | POA: Diagnosis not present

## 2023-09-10 DIAGNOSIS — I2510 Atherosclerotic heart disease of native coronary artery without angina pectoris: Secondary | ICD-10-CM | POA: Diagnosis present

## 2023-09-10 DIAGNOSIS — Z66 Do not resuscitate: Secondary | ICD-10-CM

## 2023-09-10 DIAGNOSIS — F0390 Unspecified dementia without behavioral disturbance: Secondary | ICD-10-CM | POA: Diagnosis not present

## 2023-09-10 DIAGNOSIS — R29810 Facial weakness: Secondary | ICD-10-CM | POA: Diagnosis not present

## 2023-09-10 DIAGNOSIS — R739 Hyperglycemia, unspecified: Secondary | ICD-10-CM | POA: Diagnosis not present

## 2023-09-10 DIAGNOSIS — R29707 NIHSS score 7: Secondary | ICD-10-CM | POA: Diagnosis present

## 2023-09-10 DIAGNOSIS — I10 Essential (primary) hypertension: Secondary | ICD-10-CM | POA: Diagnosis present

## 2023-09-10 DIAGNOSIS — E872 Acidosis, unspecified: Secondary | ICD-10-CM | POA: Diagnosis present

## 2023-09-10 DIAGNOSIS — Z713 Dietary counseling and surveillance: Secondary | ICD-10-CM

## 2023-09-10 DIAGNOSIS — D500 Iron deficiency anemia secondary to blood loss (chronic): Secondary | ICD-10-CM | POA: Diagnosis present

## 2023-09-10 DIAGNOSIS — Z955 Presence of coronary angioplasty implant and graft: Secondary | ICD-10-CM

## 2023-09-10 DIAGNOSIS — Z888 Allergy status to other drugs, medicaments and biological substances status: Secondary | ICD-10-CM

## 2023-09-10 DIAGNOSIS — I6521 Occlusion and stenosis of right carotid artery: Secondary | ICD-10-CM | POA: Diagnosis present

## 2023-09-10 DIAGNOSIS — Z6825 Body mass index (BMI) 25.0-25.9, adult: Secondary | ICD-10-CM

## 2023-09-10 DIAGNOSIS — Z7189 Other specified counseling: Secondary | ICD-10-CM

## 2023-09-10 DIAGNOSIS — Z8719 Personal history of other diseases of the digestive system: Secondary | ICD-10-CM

## 2023-09-10 DIAGNOSIS — I708 Atherosclerosis of other arteries: Secondary | ICD-10-CM | POA: Diagnosis present

## 2023-09-10 DIAGNOSIS — G8191 Hemiplegia, unspecified affecting right dominant side: Secondary | ICD-10-CM | POA: Diagnosis present

## 2023-09-10 DIAGNOSIS — E663 Overweight: Secondary | ICD-10-CM | POA: Diagnosis present

## 2023-09-10 DIAGNOSIS — D509 Iron deficiency anemia, unspecified: Secondary | ICD-10-CM | POA: Diagnosis present

## 2023-09-10 DIAGNOSIS — Z7901 Long term (current) use of anticoagulants: Secondary | ICD-10-CM

## 2023-09-10 DIAGNOSIS — G4489 Other headache syndrome: Secondary | ICD-10-CM | POA: Diagnosis not present

## 2023-09-10 DIAGNOSIS — Z6826 Body mass index (BMI) 26.0-26.9, adult: Secondary | ICD-10-CM

## 2023-09-10 DIAGNOSIS — E119 Type 2 diabetes mellitus without complications: Secondary | ICD-10-CM

## 2023-09-10 DIAGNOSIS — I5042 Chronic combined systolic (congestive) and diastolic (congestive) heart failure: Secondary | ICD-10-CM | POA: Diagnosis present

## 2023-09-10 DIAGNOSIS — H9190 Unspecified hearing loss, unspecified ear: Secondary | ICD-10-CM | POA: Diagnosis present

## 2023-09-10 DIAGNOSIS — E785 Hyperlipidemia, unspecified: Secondary | ICD-10-CM | POA: Diagnosis present

## 2023-09-10 DIAGNOSIS — Z79899 Other long term (current) drug therapy: Secondary | ICD-10-CM

## 2023-09-10 DIAGNOSIS — G459 Transient cerebral ischemic attack, unspecified: Secondary | ICD-10-CM | POA: Diagnosis not present

## 2023-09-10 DIAGNOSIS — Z7984 Long term (current) use of oral hypoglycemic drugs: Secondary | ICD-10-CM

## 2023-09-10 LAB — COMPREHENSIVE METABOLIC PANEL
ALT: 13 U/L (ref 0–44)
AST: 20 U/L (ref 15–41)
Albumin: 3.3 g/dL — ABNORMAL LOW (ref 3.5–5.0)
Alkaline Phosphatase: 50 U/L (ref 38–126)
Anion gap: 12 (ref 5–15)
BUN: 27 mg/dL — ABNORMAL HIGH (ref 8–23)
CO2: 22 mmol/L (ref 22–32)
Calcium: 8.4 mg/dL — ABNORMAL LOW (ref 8.9–10.3)
Chloride: 103 mmol/L (ref 98–111)
Creatinine, Ser: 1.72 mg/dL — ABNORMAL HIGH (ref 0.61–1.24)
GFR, Estimated: 38 mL/min — ABNORMAL LOW (ref >60)
Glucose, Bld: 178 mg/dL — ABNORMAL HIGH (ref 70–99)
Potassium: 3.7 mmol/L (ref 3.5–5.1)
Sodium: 137 mmol/L (ref 135–145)
Total Bilirubin: 0.7 mg/dL (ref 0.0–1.2)
Total Protein: 6 g/dL — ABNORMAL LOW (ref 6.5–8.1)

## 2023-09-10 LAB — DIFFERENTIAL
Abs Immature Granulocytes: 0.02 10*3/uL (ref 0.00–0.07)
Basophils Absolute: 0 10*3/uL (ref 0.0–0.1)
Basophils Relative: 1 %
Eosinophils Absolute: 0.2 10*3/uL (ref 0.0–0.5)
Eosinophils Relative: 4 %
Immature Granulocytes: 0 %
Lymphocytes Relative: 25 %
Lymphs Abs: 1.1 10*3/uL (ref 0.7–4.0)
Monocytes Absolute: 0.4 10*3/uL (ref 0.1–1.0)
Monocytes Relative: 8 %
Neutro Abs: 2.9 10*3/uL (ref 1.7–7.7)
Neutrophils Relative %: 62 %

## 2023-09-10 LAB — I-STAT CHEM 8, ED
BUN: 29 mg/dL — ABNORMAL HIGH (ref 8–23)
Calcium, Ion: 1.1 mmol/L — ABNORMAL LOW (ref 1.15–1.40)
Chloride: 105 mmol/L (ref 98–111)
Creatinine, Ser: 2 mg/dL — ABNORMAL HIGH (ref 0.61–1.24)
Glucose, Bld: 170 mg/dL — ABNORMAL HIGH (ref 70–99)
HCT: 27 % — ABNORMAL LOW (ref 39.0–52.0)
Hemoglobin: 9.2 g/dL — ABNORMAL LOW (ref 13.0–17.0)
Potassium: 3.7 mmol/L (ref 3.5–5.1)
Sodium: 139 mmol/L (ref 135–145)
TCO2: 25 mmol/L (ref 22–32)

## 2023-09-10 LAB — PROTIME-INR
INR: 1.2 (ref 0.8–1.2)
Prothrombin Time: 14.9 s (ref 11.4–15.2)

## 2023-09-10 LAB — CBC
HCT: 27.6 % — ABNORMAL LOW (ref 39.0–52.0)
Hemoglobin: 9 g/dL — ABNORMAL LOW (ref 13.0–17.0)
MCH: 31 pg (ref 26.0–34.0)
MCHC: 32.6 g/dL (ref 30.0–36.0)
MCV: 95.2 fL (ref 80.0–100.0)
Platelets: 232 10*3/uL (ref 150–400)
RBC: 2.9 MIL/uL — ABNORMAL LOW (ref 4.22–5.81)
RDW: 13.6 % (ref 11.5–15.5)
WBC: 4.5 10*3/uL (ref 4.0–10.5)
nRBC: 0 % (ref 0.0–0.2)

## 2023-09-10 LAB — ETHANOL: Alcohol, Ethyl (B): <10 mg/dL (ref <10)

## 2023-09-10 LAB — APTT: aPTT: 40 s — ABNORMAL HIGH (ref 24–36)

## 2023-09-10 LAB — CBG MONITORING, ED: Glucose-Capillary: 169 mg/dL — ABNORMAL HIGH (ref 70–99)

## 2023-09-10 MED ORDER — MEMANTINE HCL 10 MG PO TABS
10.0000 mg | ORAL_TABLET | Freq: Two times a day (BID) | ORAL | Status: DC
Start: 2023-09-11 — End: 2023-09-13
  Administered 2023-09-11 – 2023-09-13 (×5): 10 mg via ORAL
  Filled 2023-09-10 (×5): qty 1

## 2023-09-10 MED ORDER — PANTOPRAZOLE SODIUM 40 MG PO TBEC
40.0000 mg | DELAYED_RELEASE_TABLET | Freq: Two times a day (BID) | ORAL | Status: DC
  Administered 2023-09-10 – 2023-09-11 (×2): 40 mg via ORAL
  Filled 2023-09-10 (×3): qty 1

## 2023-09-10 MED ORDER — ACETAMINOPHEN 650 MG RE SUPP: 650.0000 mg | RECTAL | Status: DC | PRN

## 2023-09-10 MED ORDER — ATORVASTATIN CALCIUM 40 MG PO TABS
40.0000 mg | ORAL_TABLET | Freq: Every day | ORAL | Status: DC
  Administered 2023-09-10 – 2023-09-13 (×4): 40 mg via ORAL
  Filled 2023-09-10 (×4): qty 1

## 2023-09-10 MED ORDER — ACETAMINOPHEN 160 MG/5ML PO SOLN: 650.0000 mg | ORAL | Status: DC | PRN

## 2023-09-10 MED ORDER — POLYETHYLENE GLYCOL 3350 17 G PO PACK: 17.0000 g | PACK | Freq: Every day | ORAL | Status: DC | PRN

## 2023-09-10 MED ORDER — SODIUM CHLORIDE 0.9% FLUSH
3.0000 mL | Freq: Two times a day (BID) | INTRAVENOUS | Status: DC
  Administered 2023-09-10 – 2023-09-12 (×5): 10 mL via INTRAVENOUS
  Administered 2023-09-13: 3 mL via INTRAVENOUS

## 2023-09-10 MED ORDER — LACTATED RINGERS IV SOLN: INTRAVENOUS | Status: AC

## 2023-09-10 MED ORDER — ASPIRIN 325 MG PO TABS
ORAL_TABLET | ORAL | Status: AC
  Filled 2023-09-10: qty 1

## 2023-09-10 MED ORDER — SODIUM CHLORIDE 0.9% FLUSH
3.0000 mL | Freq: Once | INTRAVENOUS | Status: AC
  Administered 2023-09-10: 3 mL via INTRAVENOUS

## 2023-09-10 MED ORDER — ACETAMINOPHEN 325 MG PO TABS: 650.0000 mg | ORAL_TABLET | ORAL | Status: DC | PRN

## 2023-09-10 MED ORDER — STROKE: EARLY STAGES OF RECOVERY BOOK: Freq: Once | Status: DC

## 2023-09-10 MED ORDER — IOHEXOL 350 MG/ML SOLN
75.0000 mL | Freq: Once | INTRAVENOUS | Status: AC | PRN
Start: 2023-09-10 — End: 2023-09-10
  Administered 2023-09-10: 75 mL via INTRAVENOUS

## 2023-09-10 MED ORDER — AMIODARONE HCL 200 MG PO TABS
200.0000 mg | ORAL_TABLET | Freq: Every day | ORAL | Status: DC
  Administered 2023-09-11 – 2023-09-13 (×3): 200 mg via ORAL
  Filled 2023-09-10 (×3): qty 1

## 2023-09-10 MED ORDER — ASPIRIN 81 MG PO CHEW: 324.0000 mg | CHEWABLE_TABLET | Freq: Once | ORAL | Status: DC

## 2023-09-10 MED ORDER — ADULT MULTIVITAMIN W/MINERALS CH
1.0000 | ORAL_TABLET | Freq: Every day | ORAL | Status: DC
  Administered 2023-09-11 – 2023-09-13 (×3): 1 via ORAL
  Filled 2023-09-10 (×3): qty 1

## 2023-09-10 MED ORDER — ASPIRIN 300 MG RE SUPP
300.0000 mg | Freq: Once | RECTAL | Status: DC
Start: 2023-09-10 — End: 2023-09-10

## 2023-09-10 MED ORDER — ASPIRIN 325 MG PO TABS
325.0000 mg | ORAL_TABLET | Freq: Once | ORAL | Status: AC
  Administered 2023-09-10: 325 mg via ORAL

## 2023-09-10 MED ORDER — SODIUM CHLORIDE 0.9% FLUSH: 3.0000 mL | INTRAVENOUS | Status: DC | PRN

## 2023-09-10 NOTE — Consult Note (Signed)
 Stroke Neurology Consultation Note  Consult Requested by: Dr. Eloise Harman  Reason for Consult: code stroke  Consult Date: 09/10/23   The history was obtained from the EMS and wife.  During history and examination, all items were able to obtain unless otherwise noted.  History of Present Illness:  Alexander Donaldson is a 89 y.o. African American male with PMH of atrial fibrillation off Eliquis, diabetes, hypertension, hyperlipidemia, heart failure, CKD, CAD, Alzheimer's who presents for code stroke. Per wife, he was last seen at baseline 4pm. Around 4:35pm, his children came by to see him and he told them he did not feel well. Wife noted around 5pm with right arm weakness and right leg weakness when getting him to bathroom with walker. She did not notice any facial droop. EMS called. Glucose 169 and BP 160/72 with EMS.  Pt was just discharged yesterday for right pontine stroke and UGIB. Had EGD showed no bleeding. Wife decided not pursue further GIB work up and not to continue eliquis or ASA. She would like more home hospice approach. Pt has baseline dementia, incoherent and slurry speech at baseline, but was able to walk with walker to bathroom.   LSN: 4pm TNK Given: No: recent stroke and UGIB IR: no, no LVO mRS = 3-4  Past Medical History:  Diagnosis Date   Acute encephalopathy 02/12/2018   Alzheimer's dementia (HCC)    CAD (coronary artery disease)    CAP (community acquired pneumonia) 02/11/2018   Carpal tunnel syndrome, bilateral 02/25/2020   Coronary artery disease    Diabetes mellitus    Diverticulitis    Hearing loss    Hypertension    Hypokalemia 02/12/2018   NSTEMI (non-ST elevated myocardial infarction) [facility])     Past Surgical History:  Procedure Laterality Date   ANGIOPLASTY     CORONARY STENT INTERVENTION N/A 02/02/2017   Procedure: Coronary Stent Intervention;  Surgeon: Rinaldo Cloud, MD;  Location: [facility] INVASIVE CV LAB;  Service: Cardiovascular;  Laterality: N/A;    CORONARY STENTS     LEFT HEART CATH AND CORONARY ANGIOGRAPHY N/A 02/02/2017   Procedure: Left Heart Cath and Coronary Angiography;  Surgeon: Rinaldo Cloud, MD;  Location: [facility] INVASIVE CV LAB;  Service: Cardiovascular;  Laterality: N/A;   LEFT HEART CATHETERIZATION WITH CORONARY ANGIOGRAM N/A 03/16/2012   Procedure: LEFT HEART CATHETERIZATION WITH CORONARY ANGIOGRAM;  Surgeon: Robynn Pane, MD;  Location: [facility] CATH LAB;  Service: Cardiovascular;  Laterality: N/A;   PERCUTANEOUS CORONARY STENT INTERVENTION (PCI-S)  03/16/2012   Procedure: PERCUTANEOUS CORONARY STENT INTERVENTION (PCI-S);  Surgeon: Robynn Pane, MD;  Location: [facility] CATH LAB;  Service: Cardiovascular;;   TONSILLECTOMY      No family history on file.  Social History:  reports that he has never smoked. He has never used smokeless tobacco. He reports that he does not drink alcohol and does not use drugs.  Allergies:  Allergies  Allergen Reactions   Lisinopril     Other reaction(s): Cough   Metformin Diarrhea    No current facility-administered medications on file prior to encounter.   Current Outpatient Medications on File Prior to Encounter  Medication Sig Dispense Refill   amiodarone (PACERONE) 200 MG tablet Take 1 tablet (200 mg total) by mouth daily. 30 tablet 3   amLODipine (NORVASC) 10 MG tablet Take 1 tablet (10 mg total) by mouth daily. 30 tablet 3   Atorvastatin Calcium 20 MG/5ML SUSP Take 10 mLs by mouth daily.     Memantine HCl 10 MG/5ML SOLN  Take 5 mLs by mouth 2 (two) times daily at 10 AM and 5 PM. 300 mL 3   Multiple Vitamin (MULTIVITAMIN) tablet Take 1 tablet by mouth daily.     nitroGLYCERIN (NITROSTAT) 0.4 MG SL tablet Place 1 tablet (0.4 mg total) under the tongue every 5 (five) minutes x 3 doses as needed for chest pain. 25 tablet 3   pantoprazole (PROTONIX) 40 MG tablet Take 1 tablet (40 mg total) by mouth 2 (two) times daily. 60 tablet 11   polyethylene glycol (MIRALAX / GLYCOLAX) 17 g packet Take 17 g by  mouth daily as needed for mild constipation.      Review of Systems: A full ROS was attempted today and was not able to be performed due to dementia and language deficit.  Physical Examination: Temp:  [98 F (36.7 C)] 98 F (36.7 C) (03/02 1901) Pulse Rate:  [100] 100 (03/02 1901) Resp:  [18] 18 (03/02 1901) BP: (161)/(71) 161/71 (03/02 1901) SpO2:  [100 %] 100 % (03/02 1901) Weight:  [70.8 kg] 70.8 kg (03/02 1818)  General - well nourished, well developed, in no apparent distress.    Ophthalmologic - fundi not visualized due to noncooperation.    Cardiovascular - regular rhythm and rate, not in afib  Neuro - awake, alert, eyes open, initially not able to answer orientation questions but later in CT suite he was able to tell me his first name, but still not orientated to age, place, time. Able to tell me his first name, intermittently speaking in short sentences, but mostly incoherent and random sentences and word salad, but later in CT he was able to name "nose" "ear" and able to repeat "today is a nice day" in moderately dysarthric voice. Intermittently following simple commands, with significant hearing loss. No gaze palsy, tracking bilaterally, inconsistently blinking to visual threat on the right. Slight right facial droop. Tongue protrusion not cooperative. LUE no drift but RUE 2-3/5. BLE 3/5 seems symmetrical. Sensation, coordination not cooperative but withdraw to pain on BLEs and gait not tested.   NIH Stroke Scale  Level Of Consciousness 0=Alert; keenly responsive 1=Arouse to minor stimulation 2=Requires repeated stimulation to arouse or movements to pain 3=postures or unresponsive 0  LOC Questions to Month and Age 89=Answers both questions correctly 1=Answers one question correctly or dysarthria/intubated/trauma/language barrier 2=Answers neither question correctly or aphasia 2  LOC Commands      -Open/Close eyes     -Open/close grip     -Pantomime commands if  communication barrier 0=Performs both tasks correctly 1=Performs one task correctly 2=Performs neighter task correctly 2  Best Gaze     -Only assess horizontal gaze 0=Normal 1=Partial gaze palsy 2=Forced deviation, or total gaze paresis 0  Visual 0=No visual loss 1=Partial hemianopia 2=Complete hemianopia 3=Bilateral hemianopia (blind including cortical blindness) 1  Facial Palsy     -Use grimace if obtunded 0=Normal symmetrical movement 1=Minor paralysis (asymmetry) 2=Partial paralysis (lower face) 3=Complete paralysis (upper and lower face) 1  Motor  0=No drift for 10/5 seconds 1=Drift, but does not hit bed 2=Some antigravity effort, hits  bed 3=No effort against gravity, limb falls 4=No movement 0=Amputation/joint fusion Right Arm 2     Leg 1    Left Arm 0     Leg 1  Limb Ataxia     - FNT/HTS 0=Absent or does not understand or paralyzed or amputation/joint fusion 1=Present in one limb 2=Present in two limbs 0  Sensory 0=Normal 1=Mild to moderate sensory loss 2=Severe to  total sensory loss or coma/unresponsive 0  Best Language 0=No aphasia, normal 1=Mild to moderate aphasia 2=Severe aphasia 3=Mute, global aphasia, or coma/unresponsive 1  Dysarthria 0=Normal 1=Mild to moderate 2=Severe, unintelligible or mute/anarthric 0=intubated/unable to test 1  Extinction/Neglect 0=No abnormality 1=visual/tactile/auditory/spatia/personal inattention/Extinction to bilateral simultaneous stimulation 2=Profound neglect/extinction more than 1 modality  2  Total   14     Data Reviewed: CT HEAD CODE STROKE WO CONTRAST Result Date: 09/10/2023 CLINICAL DATA:  Code stroke. Neuro deficit, acute, stroke suspected. Patient discharged yesterday following hospitalization for acute pontine infarct. EXAM: CT HEAD WITHOUT CONTRAST TECHNIQUE: Contiguous axial images were obtained from the base of the skull through the vertex without intravenous contrast. RADIATION DOSE REDUCTION: This exam was  performed according to the departmental dose-optimization program which includes automated exposure control, adjustment of the mA and/or kV according to patient size and/or use of iterative reconstruction technique. COMPARISON:  CT head without contrast 09/03/2023. MR head without contrast 09/04/2023. FINDINGS: Brain: The study is moderately degraded by patient motion. The right paramedian pontine infarct is noted. Moderate generalized atrophy and white matter disease is otherwise stable. The ventricles are proportionate to the degree of atrophy. Chronic encephalomalacia is again noted at the left temporal tip. The brainstem and cerebellum are otherwise within normal limits. Midline structures are within normal limits. Vascular: Atherosclerotic calcifications are present within the cavernous internal carotid arteries and at the dural margin of both vertebral arteries. No hyperdense vessel is present. Skull: Calvarium is intact. No focal lytic or blastic lesions are present. No significant extracranial soft tissue lesion is present. Sinuses/Orbits: The paranasal sinuses and mastoid air cells are clear. The globes and orbits are within normal limits. ASPECTS [facility] Stroke Program Early CT Score) - Ganglionic level infarction (caudate, lentiform nuclei, internal capsule, insula, M1-M3 cortex): 7/7 - Supraganglionic infarction (M4-M6 cortex): 3/3 Total score (0-10 with 10 being normal): 10/10 IMPRESSION: 1. Right paramedian pontine infarct. 2. Stable atrophy and white matter disease. This likely reflects the sequela of chronic microvascular ischemia. 3. Chronic encephalomalacia at the left temporal tip. 4. No acute intracranial abnormality or significant interval change. The above was relayed via text pager to Dr. Iver Nestle on 09/10/2023 at 18:40 . Electronically Signed   By: Marin Roberts M.D.   On: 09/10/2023 18:41   MR ANGIO HEAD WO CONTRAST Result Date: 09/06/2023 CLINICAL DATA:  Follow-up examination for  stroke. EXAM: MRA HEAD WITHOUT CONTRAST TECHNIQUE: Angiographic images of the Circle of Willis were acquired using MRA technique without intravenous contrast. COMPARISON:  MRI from 09/04/2023. FINDINGS: Anterior circulation: Examination moderately to severely degraded by motion artifact. Both internal carotid arteries are patent through the siphons without visible stenosis. There is question of a 2-3 mm outpouching extending superiorly from the left ICA terminus (series 5, image 132), which could reflect a small aneurysm. Difficult to be certain given motion degradation on this exam. A1 segments patent bilaterally. Grossly normal anterior communicating artery complex. Anterior cerebral arteries patent without stenosis. No M1 stenosis or occlusion. Distal MCA branches perfused and fairly symmetric. Posterior circulation: Both V4 segments patent without stenosis. Right vertebral artery dominant. Both PICA patent at their origins. Basilar patent without stenosis. Superior cerebral arteries patent bilaterally. Both PCAs primarily supplied via the basilar. PCAs are patent to their distal aspects without visible stenosis. Anatomic variants: None significant. Other: None. IMPRESSION: 1. Motion degraded exam. 2. Negative intracranial MRA for large vessel occlusion. No hemodynamically significant or correctable stenosis. 3. Question 2-3 mm outpouching extending superiorly from the left  ICA terminus, suspicious for a small aneurysm. Difficult to be certain of this finding given motion degradation on this exam. Attention at follow-up recommended. Electronically Signed   By: Rise Mu M.D.   On: 09/06/2023 18:46   VAS US CAROTID Result Date: 09/06/2023 Carotid Arterial Duplex Study Patient Name:  KENETH BORG  Date of Exam:   09/06/2023 Medical Rec #: [id number]        Accession #:    [id number] Date of Birth: 1934-06-13         Patient Gender: M Patient Age:   24 years Exam Location:  [facility]  Procedure:      VAS US CAROTID Referring Phys: Gevena Mart --------------------------------------------------------------------------------  Indications:       CVA. Risk Factors:      Hypertension, Diabetes, no history of smoking, prior MI,                    coronary artery disease. Other Factors:     Afib, CHF. Comparison Study:  No previous exams Performing Technologist: Jody Hill RVT, RDMS  Examination Guidelines: A complete evaluation includes B-mode imaging, spectral Doppler, color Doppler, and power Doppler as needed of all accessible portions of each vessel. Bilateral testing is considered an integral part of a complete examination. Limited examinations for reoccurring indications may be performed as noted.  Right Carotid Findings: +----------+--------+--------+--------+------------------+------------------+           PSV cm/sEDV cm/sStenosisPlaque DescriptionComments           +----------+--------+--------+--------+------------------+------------------+ CCA Prox  61      10              calcific          intimal thickening +----------+--------+--------+--------+------------------+------------------+ CCA Distal55      9               calcific          intimal thickening +----------+--------+--------+--------+------------------+------------------+ ICA Prox  96      17      1-39%   smooth            Shadowing          +----------+--------+--------+--------+------------------+------------------+ ICA Distal44      9                                                    +----------+--------+--------+--------+------------------+------------------+ ECA       208     2       >50%    calcific          shadowing          +----------+--------+--------+--------+------------------+------------------+ +----------+--------+-------+----------------+-------------------+           PSV cm/sEDV cmsDescribe        Arm Pressure (mmHG)  +----------+--------+-------+----------------+-------------------+ [id number]             Multiphasic, WNL                    +----------+--------+-------+----------------+-------------------+ +---------+--------+--+--------+-+---------+ VertebralPSV cm/s49EDV cm/s9Antegrade +---------+--------+--+--------+-+---------+  Left Carotid Findings: +----------+-------+-------+--------+---------------------------------+--------+           PSV    EDV    StenosisPlaque Description               Comments           cm/s   cm/s                                                     +----------+-------+-------+--------+---------------------------------+--------+  CCA Prox  84     12             calcific                                  +----------+-------+-------+--------+---------------------------------+--------+ CCA Distal73     11                                                       +----------+-------+-------+--------+---------------------------------+--------+ ICA Prox  95     16     1-39%   irregular, heterogenous and                                               calcific                                  +----------+-------+-------+--------+---------------------------------+--------+ ICA Distal59     12                                                       +----------+-------+-------+--------+---------------------------------+--------+ ECA       130    7              calcific                                  +----------+-------+-------+--------+---------------------------------+--------+ +----------+--------+--------+---------+-------------------+           PSV cm/sEDV cm/sDescribe Arm Pressure (mmHG) +----------+--------+--------+---------+-------------------+ Subclavian213             Turbulent                    +----------+--------+--------+---------+-------------------+ +---------+--------+--+--------+-+---------+ VertebralPSV  cm/s40EDV cm/s9Antegrade +---------+--------+--+--------+-+---------+   Summary: Right Carotid: Velocities in the right ICA are consistent with a 1-39% stenosis. Left Carotid: Velocities in the left ICA are consistent with a 1-39% stenosis.               Unstable plaque seen in proximal ICA. Vertebrals:  Bilateral vertebral arteries demonstrate antegrade flow. Subclavians: Left subclavian artery flow was disturbed. Normal flow hemodynamics              were seen in the right subclavian artery. *See table(s) above for measurements and observations.  Electronically signed by Coral Else MD on 09/06/2023 at 4:45:04 PM.    Final    ECHOCARDIOGRAM COMPLETE Result Date: 09/05/2023    ECHOCARDIOGRAM REPORT   Patient Name:   CUTBERTO WINFREE Date of Exam: 09/05/2023 Medical Rec #:  [id number]       Height:       64.0 in Accession #:    [id number]      Weight:       151.9 lb Date of Birth:  June 20, 1934        BSA:          1.740 m Patient Age:  89 years        BP:           175/66 mmHg Patient Gender: M               HR:           62 bpm. Exam Location:  Inpatient Procedure: 2D Echo, Cardiac Doppler and Color Doppler (Both Spectral and Color            Flow Doppler were utilized during procedure). Indications:    Stroke I63.9  History:        Patient has prior history of Echocardiogram examinations, most                 recent 07/02/2023. Previous Myocardial Infarction and CAD, TIA,                 Arrythmias:Atrial Fibrillation; Risk Factors:Diabetes.  Sonographer:    Lucendia Herrlich RCS Referring Phys: [id number] Kateri Mc LATIF [facility] IMPRESSIONS  1. Left ventricular ejection fraction, by estimation, is 60 to 65%. The left ventricle has normal function. The left ventricle has no regional wall motion abnormalities. There is mild concentric left ventricular hypertrophy of the basal-septal segment. Left ventricular diastolic parameters are consistent with Grade I diastolic dysfunction (impaired relaxation).  2. Right  ventricular systolic function is normal. The right ventricular size is normal.  3. Left atrial size was mildly dilated.  4. The mitral valve is normal in structure. Trivial mitral valve regurgitation. No evidence of mitral stenosis.  5. The aortic valve is moderately calcified. The right and left coronary cusps are fused. The aortic valve is tricuspid. There is mild calcification of the aortic valve. Aortic valve regurgitation is trivial. Mild aortic valve stenosis. FINDINGS  Left Ventricle: Left ventricular ejection fraction, by estimation, is 60 to 65%. The left ventricle has normal function. The left ventricle has no regional wall motion abnormalities. Strain imaging was not performed. The left ventricular internal cavity  size was normal in size. There is mild concentric left ventricular hypertrophy of the basal-septal segment. Left ventricular diastolic parameters are consistent with Grade I diastolic dysfunction (impaired relaxation). Right Ventricle: The right ventricular size is normal. No increase in right ventricular wall thickness. Right ventricular systolic function is normal. Left Atrium: Left atrial size was mildly dilated. Right Atrium: Right atrial size was normal in size. Pericardium: There is no evidence of pericardial effusion. Mitral Valve: The mitral valve is normal in structure. Mild mitral annular calcification. Trivial mitral valve regurgitation. No evidence of mitral valve stenosis. Tricuspid Valve: The tricuspid valve is normal in structure. Tricuspid valve regurgitation is trivial. No evidence of tricuspid stenosis. Aortic Valve: The aortic valve is moderately calcified. The right and left coronary cusps are fused. The aortic valve is tricuspid. There is mild calcification of the aortic valve. Aortic valve regurgitation is trivial. Mild aortic stenosis is present. Aortic valve mean gradient measures 5.0 mmHg. Aortic valve peak gradient measures 10.0 mmHg. Aortic valve area, by VTI measures  1.50 cm. Pulmonic Valve: The pulmonic valve was normal in structure. Pulmonic valve regurgitation is trivial. No evidence of pulmonic stenosis. Aorta: The aortic root is normal in size and structure. Venous: The inferior vena cava was not well visualized. IAS/Shunts: No atrial level shunt detected by color flow Doppler. Additional Comments: 3D imaging was not performed.  LEFT VENTRICLE PLAX 2D LVIDd:         3.30 cm     Diastology LVIDs:  2.60 cm     LV e' medial:    4.57 cm/s LV PW:         0.90 cm     LV E/e' medial:  10.2 LV IVS:        1.20 cm     LV e' lateral:   5.98 cm/s LVOT diam:     2.10 cm     LV E/e' lateral: 7.8 LV SV:         50 LV SV Index:   29 LVOT Area:     3.46 cm  LV Volumes (MOD) LV vol d, MOD A2C: 80.8 ml LV vol d, MOD A4C: 64.8 ml LV vol s, MOD A2C: 41.9 ml LV vol s, MOD A4C: 35.9 ml LV SV MOD A2C:     38.9 ml LV SV MOD A4C:     64.8 ml LV SV MOD BP:      36.1 ml RIGHT VENTRICLE RV S prime:     14.30 cm/s TAPSE (M-mode): 1.0 cm LEFT ATRIUM             Index        RIGHT ATRIUM          Index LA diam:        3.90 cm 2.24 cm/m   RA Area:     8.47 cm LA Vol (A2C):   51.5 ml 29.59 ml/m  RA Volume:   12.70 ml 7.30 ml/m LA Vol (A4C):   48.4 ml 27.81 ml/m LA Biplane Vol: 49.2 ml 28.27 ml/m  AORTIC VALVE AV Area (Vmax):    1.63 cm AV Area (Vmean):   1.45 cm AV Area (VTI):     1.50 cm AV Vmax:           158.00 cm/s AV Vmean:          108.000 cm/s AV VTI:            0.333 m AV Peak Grad:      10.0 mmHg AV Mean Grad:      5.0 mmHg LVOT Vmax:         74.15 cm/s LVOT Vmean:        45.200 cm/s LVOT VTI:          0.144 m LVOT/AV VTI ratio: 0.43  AORTA Ao Root diam: 3.60 cm Ao Asc diam:  3.50 cm MITRAL VALVE               TRICUSPID VALVE MV Area (PHT): 2.20 cm    TR Peak grad:   18.5 mmHg MV Decel Time: 345 msec    TR Vmax:        215.00 cm/s MV E velocity: 46.60 cm/s MV A velocity: 95.40 cm/s  SHUNTS MV E/A ratio:  0.49        Systemic VTI:  0.14 m                            Systemic Diam:  2.10 cm Arvilla Meres MD Electronically signed by Arvilla Meres MD Signature Date/Time: 09/05/2023/2:08:26 PM    Final    MR Brain Wo Contrast (neuro protocol) Result Date: 09/04/2023 CLINICAL DATA:  Provided history: Transient ischemic attack (TIA). Additional history provided: Facial droop, slurred speech, weakness, confusion, agitation. EXAM: MRI HEAD WITHOUT CONTRAST TECHNIQUE: Multiplanar, multiecho pulse sequences of the brain and surrounding structures were obtained without intravenous contrast. COMPARISON:  Head CT 09/03/2023.  Brain MRI 03/31/2020.  FINDINGS: Significantly motion degraded examination, limiting evaluation. Most notably, the axial diffusion-weighted sequence is severely motion degraded, the axial SWI sequence is severely motion degraded and the axial FLAIR sequence is severely motion degraded. Within these limitations, findings are as follows. Brain: Moderate-to-advanced generalized cerebral atrophy. Commensurate prominence of the ventricles sulci. Mild cerebellar atrophy. 16 mm acute infarct within the right aspect of the pons (for instance as seen on series 3, image 16) (series 4, image 17). Multifocal T2 FLAIR hyperintense signal abnormality within the cerebral white matter, nonspecific but compatible with mild-to-moderate chronic small vessel ischemic disease. Chronic lacunar infarct within the left thalamus. No evidence of an intracranial mass. No extra-axial fluid collection. No midline shift. Vascular: Maintained flow voids within the proximal large arterial vessels. Skull and upper cervical spine: No focal worrisome marrow lesion. Ligamentous hypertrophy about the dens. Incompletely assessed cervical spondylosis. Sinuses/Orbits: No mass or acute finding within the imaged orbits. Mild mucosal thickening within a posterior right ethmoid air cell. IMPRESSION: 1. Significantly motion degraded examination. Within this limitation, findings are as follows. 2. 16 mm acute infarct  within the right aspect of the pons. 3. Mild-to-moderate chronic small vessel ischemic changes within the cerebral white matter. 4. Chronic left thalamic lacunar infarct. 5. Moderate-to-advanced generalized cerebral atrophy. 6. Mild cerebellar atrophy. 7. Minor right ethmoid sinus disease. Electronically Signed   By: Jackey Loge D.O.   On: 09/04/2023 10:21   CT HEAD WO CONTRAST ( ) Result Date: 09/03/2023 CLINICAL DATA:  Right lower extremity weakness EXAM: CT HEAD WITHOUT CONTRAST TECHNIQUE: Contiguous axial images were obtained from the base of the skull through the vertex without intravenous contrast. RADIATION DOSE REDUCTION: This exam was performed according to the departmental dose-optimization program which includes automated exposure control, adjustment of the mA and/or kV according to patient size and/or use of iterative reconstruction technique. COMPARISON:  07/02/2023 FINDINGS: Brain: There is no mass, hemorrhage or extra-axial collection. There is generalized atrophy without lobar predilection. Hypodensity of the white matter is most commonly associated with chronic microvascular disease. Vascular: No hyperdense vessel or unexpected vascular calcification. Skull: The visualized skull base, calvarium and extracranial soft tissues are normal. Sinuses/Orbits: No fluid levels or advanced mucosal thickening of the visualized paranasal sinuses. No mastoid or middle ear effusion. Normal orbits. Other: None. IMPRESSION: 1. No acute intracranial abnormality. 2. Generalized atrophy and findings of chronic microvascular disease. Electronically Signed   By: Deatra Robinson M.D.   On: 09/03/2023 20:08    Assessment: 89 y.o. male with PMH of atrial fibrillation off Eliquis, diabetes, hypertension, hyperlipidemia, heart failure, CKD, CAD, Alzheimer's who presents for right arm weakness and right leg weakness. Last seen at baseline 4pm. Glucose 169 and BP 160/72 with EMS. CT no acute finding. CTA head and neck no  LVO but b/l carotid bulb and siphon asthero with severe stenosis at right ICA bulb. Not candidate for TNK given recent stroke and upper GI bleeding.  Not IR candidate given no LVO and high mRS.   Patient was discharged yesterday after right pontine stroke and upper GI bleeding.  EGD unremarkable.  However wife decided no anticoagulation or antiplatelet on discharge, more prefer home hospice approach. Etiology of her visual symptoms still concerning for left brain stroke with A-fib off Eliquis.  Recommend further stroke workup.  Will put on PR aspirin and needed PT and OT.  Stroke Risk Factors - atrial fibrillation, carotid stenosis, hyperlipidemia, and hypertension  Plan: Recommend medicine admit for continued stroke work up  Frequent neuro checks  Telemetry monitoring MRI brain  PT/OT/speech consult Permissive hypertension (only treat if BP > 220/120 unless a lower blood pressure is clinically necessary) for 24-48 hours post stroke onset GI and DVT prophylaxis  ASA PR 300 mg x 1 Stroke risk factor modification Discussed with Dr. Eloise Harman ED physician We will follow   Thank you for this consultation and allowing [place] to participate in the care of this patient.  Marvel Plan, MD PhD Stroke Neurology 09/10/2023 7:25 PM

## 2023-09-10 NOTE — H&P (Signed)
 History and Physical    Alexander Donaldson:[id number] DOB: 1934-04-10 DOA: 09/10/2023  [facility]P: [facility]   Patient coming from: Home   Chief Complaint:  Chief Complaint  Patient presents with   Code Stroke   ED TRIAGE note:  Pt BIB [facility] as a code stroke. Pt was just recently dc'ed from the hospital for stroke. Last seen normal 1700. Wife noticed pt started having R weakness and facial droop. VSS. Confused at baseline.       HPI:  Alexander Donaldson is a 89 y.o. male with medical history significant of acute ischemic CVA 2/24 discharged from the hospital 3/1,  GI bleed status post endoscopic negative currently on Protonix 40 mg twice daily, acute on chronic blood loss anemia status post 1 unit of blood transfusion 2/28, paroxysmal atrial fibrillation currently on amiodarone not on anticoagulation due to high risk of GI bleed, CKD stage IIIb, DM type II and dementia who has been discharged 09/09/2023 presented to emergency department by patient's wife that he is complaining about right-sided weakness and facial droop. Patient has history of dementia, incoherent and slightly speech at the baseline but he was able to ambulate with a walker to bathroom. Patient is pleasantly confused unable to follow any command.  He is alert and oriented. Denies any chest pain, palpitation, headache, blurry vision, nausea vomiting.  Family reported no recurrent episodes of black tarry stools since patient has been discharged from the hospital.  Patient was admitted 2/24 to 3/1 for acute CVA.  Anticoagulation, aspirin and starting antiplatelet therapy has been deferred due to GI bleed.  Not in candidate for antithrombolytic therapy as patient's wife approaching towards palliative care.  In the ED neurology has been consulted for concern for recurrent CVA.  Recommended medicine admission for stroke workup, continue neurocheck, telemonitoring, MRI of the brain, PT, OT/speech consult, permissive  hypertension for 24 to 48-hour, GI and DVT prophylaxis, aspirin 300 mg once and continue stroke risk factor modification.   At presentation to ED patient is hemodynamically stable. CBC unremarkable stable H&H 8.7 and 25. BMP showing stable creatinine 6.7 and GFR 39.  Patient had episodes of AKI during last hospital admission which has been recovering.  However repeat BMP showing creatinine has been trended up to 2  CT head IMPRESSION: 1. Right paramedian pontine infarct. 2. Stable atrophy and white matter disease. This likely reflects the sequela of chronic microvascular ischemia. 3. Chronic encephalomalacia at the left temporal tip. 4. No acute intracranial abnormality or significant interval change.  Pending CTA head and neck and MRI of the brain.  Per chart review previous MRI from 09/04/2023 showings infraction within the right aspect of the pons.  And present CT head showing same location of the of CVA without any new location.    Significant labs in the ED: Lab Orders         Protime-INR         APTT         CBC         Differential         Comprehensive metabolic panel         Ethanol         Basic metabolic panel         CBC         Lipid panel         Urinalysis, Routine w reflex microscopic -Urine, Clean Catch         Creatinine, urine,  random         Sodium, urine, random         I-stat chem 8, ED         CBG monitoring, ED       Review of Systems:  Review of Systems  Reason unable to perform ROS: Limited history from patient due toDementia.  Eyes:  Negative for blurred vision.  Respiratory:  Negative for cough and sputum production.   Cardiovascular:  Negative for chest pain and palpitations.  Gastrointestinal:  Negative for heartburn and nausea.  Musculoskeletal:        Right-sided upper and lower extremity weakness.  Neurological:  Positive for focal weakness. Negative for dizziness and headaches.  Psychiatric/Behavioral:  The patient is not  nervous/anxious.   All other systems reviewed and are negative.   Past Medical History:  Diagnosis Date   Acute encephalopathy 02/12/2018   Alzheimer's dementia (HCC)    CAD (coronary artery disease)    CAP (community acquired pneumonia) 02/11/2018   Carpal tunnel syndrome, bilateral 02/25/2020   Coronary artery disease    Diabetes mellitus    Diverticulitis    Hearing loss    Hypertension    Hypokalemia 02/12/2018   NSTEMI (non-ST elevated myocardial infarction) [facility])     Past Surgical History:  Procedure Laterality Date   ANGIOPLASTY     CORONARY STENT INTERVENTION N/A 02/02/2017   Procedure: Coronary Stent Intervention;  Surgeon: Rinaldo Cloud, MD;  Location: [facility] INVASIVE CV LAB;  Service: Cardiovascular;  Laterality: N/A;   CORONARY STENTS     LEFT HEART CATH AND CORONARY ANGIOGRAPHY N/A 02/02/2017   Procedure: Left Heart Cath and Coronary Angiography;  Surgeon: Rinaldo Cloud, MD;  Location: [facility] INVASIVE CV LAB;  Service: Cardiovascular;  Laterality: N/A;   LEFT HEART CATHETERIZATION WITH CORONARY ANGIOGRAM N/A 03/16/2012   Procedure: LEFT HEART CATHETERIZATION WITH CORONARY ANGIOGRAM;  Surgeon: Robynn Pane, MD;  Location: [facility] CATH LAB;  Service: Cardiovascular;  Laterality: N/A;   PERCUTANEOUS CORONARY STENT INTERVENTION (PCI-S)  03/16/2012   Procedure: PERCUTANEOUS CORONARY STENT INTERVENTION (PCI-S);  Surgeon: Robynn Pane, MD;  Location: [facility] CATH LAB;  Service: Cardiovascular;;   TONSILLECTOMY       reports that he has never smoked. He has never used smokeless tobacco. He reports that he does not drink alcohol and does not use drugs.  Allergies  Allergen Reactions   Lisinopril Cough   Metformin Diarrhea    History reviewed. No pertinent family history.  Prior to Admission medications   Medication Sig Start Date End Date Taking? Authorizing Provider  amiodarone (PACERONE) 200 MG tablet Take 1 tablet (200 mg total) by mouth daily. 07/05/23   Rinaldo Cloud, MD   amLODipine (NORVASC) 10 MG tablet Take 1 tablet (10 mg total) by mouth daily. 09/10/23   Rhetta Mura, MD  Atorvastatin Calcium 20 MG/5ML SUSP Take 10 mLs by mouth daily.    [provider]  Memantine HCl 10 MG/5ML SOLN Take 5 mLs by mouth 2 (two) times daily at 10 AM and 5 PM. 06/22/23   Micki Riley, MD  Multiple Vitamin (MULTIVITAMIN) tablet Take 1 tablet by mouth daily.    [provider]  nitroGLYCERIN (NITROSTAT) 0.4 MG SL tablet Place 1 tablet (0.4 mg total) under the tongue every 5 (five) minutes x 3 doses as needed for chest pain. 03/18/12   Rinaldo Cloud, MD  pantoprazole (PROTONIX) 40 MG tablet Take 1 tablet (40 mg total) by mouth 2 (two) times daily.  09/09/23   Rhetta Mura, MD  polyethylene glycol (MIRALAX / GLYCOLAX) 17 g packet Take 17 g by mouth daily as needed for mild constipation. 08/07/23   [provider]     Physical Exam: Vitals:   09/10/23 1818 09/10/23 1901 09/10/23 1915 09/10/23 1945  BP:  (!) 161/71 136/63 133/70  Pulse:  100 66 70  Resp:  18 17 (!) 23  Temp:  98 F (36.7 C)    TempSrc:  Temporal    SpO2:  100% 97% 99%  Weight: 70.8 kg       Physical Exam Vitals and nursing note reviewed.  Constitutional:      Comments: Limited conversation with the patient due to underlying dementia.  HENT:     Mouth/Throat:     Mouth: Mucous membranes are moist.  Eyes:     Pupils: Pupils are equal, round, and reactive to light.  Cardiovascular:     Rate and Rhythm: Normal rate and regular rhythm.     Pulses: Normal pulses.     Heart sounds: Normal heart sounds.  Pulmonary:     Effort: Pulmonary effort is normal.     Breath sounds: Normal breath sounds.  Abdominal:     Palpations: Abdomen is soft.  Musculoskeletal:        General: No swelling.     Cervical back: Neck supple.     Right lower leg: No edema.     Left lower leg: No edema.  Skin:    Capillary Refill: Capillary refill takes less than 2 seconds.   Neurological:     Mental Status: He is alert.     Motor: Weakness present.     Comments: Patient is pleasantly confused..  Not verbal at baseline.  Unable to follow commands appropriately. Right-sided upper and lower extremity muscle strength 1/5. Left-sided upper and lower extremity muscle strength 5/5.  Psychiatric:     Comments: Unable to assess due to baseline dementia      Labs on Admission: I have personally reviewed following labs and imaging studies  CBC: Recent Labs  Lab 09/06/23 0625 09/07/23 0712 09/08/23 0958 09/09/23 0652 09/10/23 1915 09/10/23 1926  WBC 6.4 6.6 5.8 5.3 4.5  --   NEUTROABS 3.6 3.6 3.5 3.0 2.9  --   HGB 9.3* 9.6* 8.9* 8.7* 9.0* 9.2*  HCT 27.1* 28.5* 26.4* 25.7* 27.6* 27.0*  MCV 90.6 92.2 92.0 92.1 95.2  --   PLT 200 219 226 221 232  --    Basic Metabolic Panel: Recent Labs  Lab 09/04/23 1133 09/05/23 0549 09/06/23 0625 09/07/23 0712 09/08/23 0958 09/09/23 0652 09/10/23 1915 09/10/23 1926  NA 138   < > 137 136 139 140 137 139  K 3.4*   < > 3.8 4.3 3.9 3.7 3.7 3.7  CL 107   < > 105 107 108 106 103 105  CO2 25   < > 26 22 23 25 22   --   GLUCOSE 124*   < > 142* 124* 128* 118* 178* 170*  BUN 23   < > 22 23 30* 29* 27* 29*  CREATININE 1.27*   < > 1.55* 1.68* 1.97* 1.67* 1.72* 2.00*  CALCIUM 8.2*   < > 8.5* 8.6* 8.7* 8.6* 8.4*  --   MG 1.9  --  2.1  --   --   --   --   --   PHOS 3.3  --  3.5  --   --   --   --   --    < > =  values in this interval not displayed.   GFR: Estimated Creatinine Clearance: 21.8 mL/min (A) (by C-G formula based on SCr of 2 mg/dL (H)). Liver Function Tests: Recent Labs  Lab 09/04/23 1133 09/06/23 0625 09/10/23 1915  AST 16 17 20   ALT 14 14 13   ALKPHOS 42 51 50  BILITOT 1.1 1.0 0.7  PROT 5.3* 6.1* 6.0*  ALBUMIN 2.8* 3.2* 3.3*   No results for input(s): "LIPASE", "AMYLASE" in the last 168 hours. No results for input(s): "AMMONIA" in the last 168 hours. Coagulation Profile: Recent Labs  Lab  09/10/23 1915  INR 1.2   Cardiac Enzymes: No results for input(s): "CKTOTAL", "CKMB", "CKMBINDEX", "TROPONINI", "TROPONINIHS" in the last 168 hours. BNP (last 3 results) Recent Labs    09/04/23 1133  BNP 174.6*   HbA1C: No results for input(s): "HGBA1C" in the last 72 hours. CBG: Recent Labs  Lab 09/06/23 1721 09/07/23 0000 09/07/23 1226 09/08/23 0845 09/10/23 1812  GLUCAP 173* 166* 132* 115* 169*   Lipid Profile: No results for input(s): "CHOL", "HDL", "LDLCALC", "TRIG", "CHOLHDL", "LDLDIRECT" in the last 72 hours. Thyroid Function Tests: No results for input(s): "TSH", "T4TOTAL", "FREET4", "T3FREE", "THYROIDAB" in the last 72 hours. Anemia Panel: No results for input(s): "VITAMINB12", "FOLATE", "FERRITIN", "TIBC", "IRON", "RETICCTPCT" in the last 72 hours. Urine analysis:    Component Value Date/Time   COLORURINE YELLOW 09/03/2023 2252   APPEARANCEUR CLEAR 09/03/2023 2252   LABSPEC 1.014 09/03/2023 2252   PHURINE 6.0 09/03/2023 2252   GLUCOSEU NEGATIVE 09/03/2023 2252   HGBUR MODERATE (A) 09/03/2023 2252   BILIRUBINUR NEGATIVE 09/03/2023 2252   BILIRUBINUR Negative 01/03/2019 0953   KETONESUR NEGATIVE 09/03/2023 2252   PROTEINUR NEGATIVE 09/03/2023 2252   UROBILINOGEN 0.2 01/03/2019 0953   NITRITE NEGATIVE 09/03/2023 2252   LEUKOCYTESUR NEGATIVE 09/03/2023 2252    Radiological Exams on Admission: I have personally reviewed images CT ANGIO HEAD NECK W WO CM (CODE STROKE) Result Date: 09/10/2023 CLINICAL DATA:  Neuro deficit. Stroke suspected. Subacute pontine infarct. New right-sided weakness and facial droop. EXAM: CT ANGIOGRAPHY HEAD AND NECK WITH AND WITHOUT CONTRAST TECHNIQUE: Multidetector CT imaging of the head and neck was performed using the standard protocol during bolus administration of intravenous contrast. Multiplanar CT image reconstructions and MIPs were obtained to evaluate the vascular anatomy. Carotid stenosis measurements (when applicable) are  obtained utilizing NASCET criteria, using the distal internal carotid diameter as the denominator. RADIATION DOSE REDUCTION: This exam was performed according to the departmental dose-optimization program which includes automated exposure control, adjustment of the mA and/or kV according to patient size and/or use of iterative reconstruction technique. CONTRAST:  75mL OMNIPAQUE IOHEXOL 350 MG/ML SOLN COMPARISON:  CT head without contrast 09/10/2023. MR head without contrast 09/04/2023. MR angio head 09/06/2023. FINDINGS: CTA NECK FINDINGS Aortic arch: Atherosclerotic calcifications are present at the aortic arch and great vessel origins. A 50% stenosis is present in the proximal left subclavian artery. Common origin of the left common carotid artery and innominate artery is present without a significant stenosis. No aneurysm is present. No aortic dissection is present. Right carotid system: The right common carotid artery is within normal limits. High-grade calcified stenosis is present at the right carotid bifurcation. The cervical right ICA is normal more distally. Left carotid system: The left common carotid artery to scratched at the left common carotid artery demonstrates some mural calcification without focal stenosis. Dense calcifications are present at the left carotid bifurcation the minimal luminal diameter is 2.5 mm. No significant stenosis is  present relative to the more distal vessel. Vertebral arteries: The right vertebral artery is slightly dominant. Both vertebral arteries originate from the subclavian arteries. Moderate stenosis is present at the right vertebral artery origin. No other significant stenoses are present in either vertebral artery in the neck. Skeleton: Multilevel degenerative changes are present throughout the cervical spine. Ankylosis present C5-6. Slight degenerative anterolisthesis present at C2-3 and C3-4. Marked endplate sclerotic changes present at C6-7. Osseous foraminal  narrowing present bilaterally at C4-5, C5-6 and on the right at C6-7. Other neck: The soft tissues of the neck are otherwise unremarkable. Salivary glands are within normal limits. Thyroid is normal. No significant adenopathy is present. No focal mucosal or submucosal lesions are present. Upper chest: The lung apices are clear. The thoracic inlet is within normal limits. Review of the MIP images confirms the above findings CTA HEAD FINDINGS Anterior circulation: Minimal calcifications are present within the cavernous internal carotid arteries bilaterally. No significant stenosis is present through the ICA termini. The A1 and M1 segments are normal. The anterior communicating artery is patent. The MCA bifurcations are normal bilaterally. The ACA and MCA branch vessels are normal bilaterally. Posterior circulation: Atherosclerotic calcifications are present at the dural margin of the left vertebral artery. PICA origins are visualized and normal. The vertebrobasilar junction and basilar artery are normal. Both posterior cerebral arteries originate from the scratched at the superior cerebellar arteries are patent bilaterally. Both posterior cerebral arteries originate from basilar tip. Mild narrowing is present in the mid left P2 segment. The PCA branch vessels are otherwise within normal limits. No aneurysm is present. Venous sinuses: The dural sinuses are patent. The straight sinus and deep cerebral veins are intact. Cortical veins are within normal limits. No significant vascular malformation is evident. Anatomic variants: None Review of the MIP images confirms the above findings IMPRESSION: 1. High-grade calcified stenosis of the right carotid bifurcation. 2. Dense calcifications at the left carotid bifurcation without significant stenosis relative to the more distal vessel. 3. Moderate stenosis at the dominant right vertebral artery origin. 4. 50% stenosis of the proximal left subclavian artery. 5. Mild narrowing  in the mid left P2 segment. 6. No other significant proximal stenosis, aneurysm, or branch vessel occlusion within the Circle of Willis. 7. Multilevel degenerative changes throughout the cervical spine. 8.  Aortic Atherosclerosis (ICD10-I70.0). Electronically Signed   By: Marin Roberts M.D.   On: 09/10/2023 19:32   CT HEAD CODE STROKE WO CONTRAST Result Date: 09/10/2023 CLINICAL DATA:  Code stroke. Neuro deficit, acute, stroke suspected. Patient discharged yesterday following hospitalization for acute pontine infarct. EXAM: CT HEAD WITHOUT CONTRAST TECHNIQUE: Contiguous axial images were obtained from the base of the skull through the vertex without intravenous contrast. RADIATION DOSE REDUCTION: This exam was performed according to the departmental dose-optimization program which includes automated exposure control, adjustment of the mA and/or kV according to patient size and/or use of iterative reconstruction technique. COMPARISON:  CT head without contrast 09/03/2023. MR head without contrast 09/04/2023. FINDINGS: Brain: The study is moderately degraded by patient motion. The right paramedian pontine infarct is noted. Moderate generalized atrophy and white matter disease is otherwise stable. The ventricles are proportionate to the degree of atrophy. Chronic encephalomalacia is again noted at the left temporal tip. The brainstem and cerebellum are otherwise within normal limits. Midline structures are within normal limits. Vascular: Atherosclerotic calcifications are present within the cavernous internal carotid arteries and at the dural margin of both vertebral arteries. No hyperdense vessel is present. Skull:  Calvarium is intact. No focal lytic or blastic lesions are present. No significant extracranial soft tissue lesion is present. Sinuses/Orbits: The paranasal sinuses and mastoid air cells are clear. The globes and orbits are within normal limits. ASPECTS [facility] Stroke Program Early CT Score) -  Ganglionic level infarction (caudate, lentiform nuclei, internal capsule, insula, M1-M3 cortex): 7/7 - Supraganglionic infarction (M4-M6 cortex): 3/3 Total score (0-10 with 10 being normal): 10/10 IMPRESSION: 1. Right paramedian pontine infarct. 2. Stable atrophy and white matter disease. This likely reflects the sequela of chronic microvascular ischemia. 3. Chronic encephalomalacia at the left temporal tip. 4. No acute intracranial abnormality or significant interval change. The above was relayed via text pager to Dr. Iver Nestle on 09/10/2023 at 18:40 . Electronically Signed   By: Marin Roberts M.D.   On: 09/10/2023 18:41     EKG: My personal interpretation of EKG shows: Normal sinus rhythm heart rate 87.  There is no ST-T wave abnormality.    Assessment/Plan: Principal Problem:   Acute CVA (cerebrovascular accident) (HCC) Active Problems:   Acute kidney injury superimposed on stage 3b chronic kidney disease (HCC)   Iron deficiency anemia due to chronic blood loss   CVA (cerebral vascular accident) (HCC)   History of GI bleed   Non-insulin dependent type 2 diabetes mellitus (HCC)   Essential hypertension   Dementia without behavioral disturbance (HCC)   Paroxysmal atrial fibrillation (HCC)   Chronic diastolic CHF (congestive heart failure) (HCC)   History of CVA 09/04/2023 (cerebrovascular accident)    Assessment and Plan: Acute ischemic CVA Recent CVA on 09/04/2023 > Patient was admitted on 2/24 through 3/1 for acute CVA.  Was not a good candidate for TNK, antiplatelet therapy given at the same time patient has a GI bleed underwent EGD and currently on Protonix.  Given high risk of GI bleeding patient was taken off from Eliquis.  Family wanted to pursue palliative care and home hospice. - Patient presenting to emergency department today as family noticed right-sided weakness and right-sided facial droop. -In the ED at presentation hemodynamically stable. - BMP showing evidence of AKI  on CKD CBC stable H&H 8.7 and 25. - Given patient has history of paroxysmal atrial fibrillation currently on Eliquis concern for redevelopment of recurrent stroke. -EKG showing normal sinus rhythm heart rate 81. - CT head showing right paramedian pontine infraction.  Previous MRI from 2/24 showed similar location of the ischemic stroke. -CTA neck showed high-grade calcified stenosis of the right carotid bifurcation.  Dense calcification of the left carotid bifurcation.  Moderate stenosis of the right vertebral origin.  50% stenosis of the left subclavian artery. - Neurology Dr.Xu evaluated patient in the emergency department given aspirin 300 mg per rectal once, recommended admit for stroke workup including MRI, PT, OT, speech consult, permissive hypertension for next 24 to 48 hours and a stroke factor modification. - In the ED patient has been given aspirin 300 mg once. - Continue Lipitor 10 mg liquid oral - Will do a stroke swallow screen. -Continue permissive hypertension for next 24 hours - Deferring echocardiogram as patient just have an echocardiogram on 2/25 which showed preserved EF with grade 2 diastolic heart failure.  There is no utilization repeat echocardiogram today - Pending MRI of the brain. - Pending CTA head and neck. -Continue cardiac monitoring. -Stroke order set has been utilized. Update, patient passed bedside swallow screen.  Change rectal aspirin to p.o. aspirin 325 mg one-time dose.  Will follow-up with neurology regarding long-term care about antiplatelet  therapy.  Recent GI bleed 09/04/2023 Chronic iron deficiency anemia - Patient has recent history of lower GI bleed include black tarry stool during last hospital admission.  Patient underwent EGD which did not show any evidence of bleeding.  GI recommended Protonix 40 mg twice daily.  Due to GI bleed anticoagulation and Nplate therapy has been deferred. - CBC showing stable H&H 9 and 27.6.  Continue to  monitor. -Continue Protonix 40 mg twice daily.  Paroxysmal atrial fibrillation -EKG showing sinus rhythm heart rate 87. - CHA2DS2-VASc score 7.  High risk of embolic CVA in the setting of A-fib. - EKG showing rate controlled and sinus rhythm now. - Continue amiodarone 200 mg daily. -Deferring Eliquis recent episodes of GI bleed.   AKI on CKD stage IIIb - Elevated creatinine around 2.  Baseline creatinine around 1.5-1.6.  Checking UA, urine creatinine urine sodium -Concern for development prerenal AKI in the setting of recent stroke and GI bleed.  Patient also received CT head with contrast which will put patient high risk of development of intrinsic renal AKI. - Starting maintenance fluid LR 75 cc/h. -Continue to monitor renal function, monitor urine output and avoid nephrotoxic agents.  Diastolic heart failure preserved EF 60 to 65% Essential hypertension -Holding amlodipine for permissive hypertension for next 24 to 48 hours in the event of acute CVA.  History of dementia -Continue memantine. -Continue delirium precaution.  Non-insulin-dependent DM type II -A1c 5.9. -Continue healthy carb modified diet continue check POC blood glucose.  Hyperlipidemia -Continue Lipitor  DVT prophylaxis:  SCDs.  Deferring pharmacological prophylaxis given patient has recent history of lower GI bleed. Code Status:  DNR/DNI(Do NOT Intubate).  Patient's wife and daughter at the bedside.  Discussed CODE STATUS with the family.  Reported that they had been signed DNR form last time while in the hospital but unable to find it on the chart.  Discussed CODE STATUS again and verified that patient is DNR and DNI but family want want to continue all other aggressive treatment care.  Patient is not hospice yes.  Family wants to talk with the palliative team tomorrow to transition to home hospice. Diet: Heart healthy carb modified Family Communication:   Family was present at bedside, at the time of interview.  Opportunity was given to ask question and all questions were answered satisfactorily.  Disposition Plan: Will follow-up with brain imaging.  Will follow-up with neurology for recommendation regarding antiplatelet therapy for long-term care. Consults: Neurology, PT and OT and speech therapy Admission status:   Inpatient, Telemetry bed  Severity of Illness: The appropriate patient status for this patient is INPATIENT. Inpatient status is judged to be reasonable and necessary in order to provide the required intensity of service to ensure the patient's safety. The patient's presenting symptoms, physical exam findings, and initial radiographic and laboratory data in the context of their chronic comorbidities is felt to place them at high risk for further clinical deterioration. Furthermore, it is not anticipated that the patient will be medically stable for discharge from the hospital within 2 midnights of admission.   * I certify that at the point of admission it is my clinical judgment that the patient will require inpatient hospital care spanning beyond 2 midnights from the point of admission due to high intensity of service, high risk for further deterioration and high frequency of surveillance required.Marland Kitchen    Tereasa Coop, MD [facility]  How to contact the [facility] Attending or Consulting provider 7A - 7P or covering provider  during after hours 7P -7A, for this patient.  Check the care team in [facility] and look for a) attending/consulting [facility] provider listed and b) the [facility] team listed Log into [email or web address] and use [facility] universal password to access. If you do not have the password, please contact the hospital operator. Locate the [facility] provider you are looking for under [facility] and page to a number that you can be directly reached. If you still have difficulty reaching the provider, please page the [facility] (Director on Call) for the Hospitalists listed on amion for  assistance.  09/10/2023, 8:12 PM

## 2023-09-10 NOTE — ED Triage Notes (Signed)
 Pt BIB [facility] as a code stroke. Pt was just recently dc'ed from the hospital for stroke. Last seen normal 1700. Wife noticed pt started having R weakness and facial droop. VSS. Confused at baseline.

## 2023-09-10 NOTE — ED Provider Notes (Signed)
 [place] EMERGENCY DEPARTMENT AT [facility] Provider Note   CSN: [id number] Arrival date & time: 09/10/23  1810  An emergency department physician performed an initial assessment on this suspected stroke patient at 1810.  History  Chief Complaint  Patient presents with   Code Stroke    Alexander Donaldson is a 89 y.o. male.  89 year old male with history of Alzheimer's dementia (needs help with all ADLs), pontine stroke, GI bleed, and CAD who presents emergency department with right sided weakness and difficulty speaking.  Patient was with family when at approximately 5 PM he started having difficulty speaking and weakness of his right upper extremity.  Was discharged on 09/09/2023 after being diagnosed with a right pontine CVA as well as a GI bleed.  He was taken off his Eliquis at that time.  There are some reports that he may have gone home with hospice as well.       Home Medications Prior to Admission medications   Medication Sig Start Date End Date Taking? Authorizing Provider  amiodarone (PACERONE) 200 MG tablet Take 1 tablet (200 mg total) by mouth daily. 07/05/23  Yes Rinaldo Cloud, MD  amLODipine (NORVASC) 10 MG tablet Take 1 tablet (10 mg total) by mouth daily. 09/10/23  Yes Rhetta Mura, MD  Atorvastatin Calcium 20 MG/5ML SUSP Take 40 mg by mouth every evening.   Yes [provider]  Memantine HCl 10 MG/5ML SOLN Take 5 mLs by mouth 2 (two) times daily at 10 AM and 5 PM. Patient taking differently: Take 10 mg by mouth in the morning and at bedtime. 06/22/23  Yes Micki Riley, MD  Multiple Vitamin (MULTIVITAMIN) tablet Take 1 tablet by mouth daily with breakfast.   Yes [provider]  nitroGLYCERIN (NITROSTAT) 0.4 MG SL tablet Place 1 tablet (0.4 mg total) under the tongue every 5 (five) minutes x 3 doses as needed for chest pain. 03/18/12  Yes Rinaldo Cloud, MD  pantoprazole (PROTONIX) 40 MG tablet Take 1 tablet (40 mg total) by mouth 2  (two) times daily. 09/09/23  Yes Rhetta Mura, MD  polyethylene glycol (MIRALAX / GLYCOLAX) 17 g packet Take 17 g by mouth in the morning. 08/07/23  Yes [provider]      Allergies    Lisinopril and Metformin    Review of Systems   Review of Systems  Physical Exam Updated Vital Signs BP (!) 151/71 (BP Location: Right Arm)   Pulse 68   Temp (!) 97.4 F (36.3 C) (Oral)   Resp 16   Wt 70.8 kg   SpO2 100%   BMI 26.79 kg/m  Physical Exam Vitals and nursing note reviewed.  Constitutional:      General: He is not in acute distress.    Appearance: He is well-developed.     Comments: Having difficulty following commands.  Unclear if this is due to his baseline mental status.  HENT:     Head: Normocephalic and atraumatic.     Right Ear: External ear normal.     Left Ear: External ear normal.     Nose: Nose normal.  Eyes:     Extraocular Movements: Extraocular movements intact.     Conjunctiva/sclera: Conjunctivae normal.     Pupils: Pupils are equal, round, and reactive to light.  Cardiovascular:     Rate and Rhythm: Normal rate and regular rhythm.     Heart sounds: Normal heart sounds.  Pulmonary:     Effort: Pulmonary effort is normal.  No respiratory distress.     Breath sounds: Normal breath sounds.  Musculoskeletal:     Cervical back: Normal range of motion and neck supple.     Right lower leg: No edema.     Left lower leg: No edema.  Skin:    General: Skin is warm and dry.  Neurological:     Mental Status: He is alert.     Comments: Having difficulty following commands for neuroexam.  Appears to have slight right-sided facial droop.  Cranial nerves otherwise grossly intact.  Will converse with examiners.  Drift in right upper extremity.  No drift in left upper extremity.  Symmetric drift in bilateral lower extremities.  Psychiatric:        Mood and Affect: Mood normal.        Behavior: Behavior normal.     ED Results / Procedures / Treatments    Labs (all labs ordered are listed, but only abnormal results are displayed) Labs Reviewed  APTT - Abnormal; Notable for the following components:      Result Value   aPTT 40 (*)    All other components within normal limits  CBC - Abnormal; Notable for the following components:   RBC 2.90 (*)    Hemoglobin 9.0 (*)    HCT 27.6 (*)    All other components within normal limits  COMPREHENSIVE METABOLIC PANEL - Abnormal; Notable for the following components:   Glucose, Bld 178 (*)    BUN 27 (*)    Creatinine, Ser 1.72 (*)    Calcium 8.4 (*)    Total Protein 6.0 (*)    Albumin 3.3 (*)    GFR, Estimated 38 (*)    All other components within normal limits  GLUCOSE, CAPILLARY - Abnormal; Notable for the following components:   Glucose-Capillary 157 (*)    All other components within normal limits  I-STAT CHEM 8, ED - Abnormal; Notable for the following components:   BUN 29 (*)    Creatinine, Ser 2.00 (*)    Glucose, Bld 170 (*)    Calcium, Ion 1.10 (*)    Hemoglobin 9.2 (*)    HCT 27.0 (*)    All other components within normal limits  CBG MONITORING, ED - Abnormal; Notable for the following components:   Glucose-Capillary 169 (*)    All other components within normal limits  PROTIME-INR  DIFFERENTIAL  ETHANOL  BASIC METABOLIC PANEL  CBC  LIPID PANEL  URINALYSIS, ROUTINE W REFLEX MICROSCOPIC  CREATININE, URINE, RANDOM  SODIUM, URINE, RANDOM    EKG None  Radiology CT ANGIO HEAD NECK W WO CM (CODE STROKE) Result Date: 09/10/2023 CLINICAL DATA:  Neuro deficit. Stroke suspected. Subacute pontine infarct. New right-sided weakness and facial droop. EXAM: CT ANGIOGRAPHY HEAD AND NECK WITH AND WITHOUT CONTRAST TECHNIQUE: Multidetector CT imaging of the head and neck was performed using the standard protocol during bolus administration of intravenous contrast. Multiplanar CT image reconstructions and MIPs were obtained to evaluate the vascular anatomy. Carotid stenosis measurements  (when applicable) are obtained utilizing NASCET criteria, using the distal internal carotid diameter as the denominator. RADIATION DOSE REDUCTION: This exam was performed according to the departmental dose-optimization program which includes automated exposure control, adjustment of the mA and/or kV according to patient size and/or use of iterative reconstruction technique. CONTRAST:  75mL OMNIPAQUE IOHEXOL 350 MG/ML SOLN COMPARISON:  CT head without contrast 09/10/2023. MR head without contrast 09/04/2023. MR angio head 09/06/2023. FINDINGS: CTA NECK FINDINGS Aortic arch: Atherosclerotic calcifications  are present at the aortic arch and great vessel origins. A 50% stenosis is present in the proximal left subclavian artery. Common origin of the left common carotid artery and innominate artery is present without a significant stenosis. No aneurysm is present. No aortic dissection is present. Right carotid system: The right common carotid artery is within normal limits. High-grade calcified stenosis is present at the right carotid bifurcation. The cervical right ICA is normal more distally. Left carotid system: The left common carotid artery to scratched at the left common carotid artery demonstrates some mural calcification without focal stenosis. Dense calcifications are present at the left carotid bifurcation the minimal luminal diameter is 2.5 mm. No significant stenosis is present relative to the more distal vessel. Vertebral arteries: The right vertebral artery is slightly dominant. Both vertebral arteries originate from the subclavian arteries. Moderate stenosis is present at the right vertebral artery origin. No other significant stenoses are present in either vertebral artery in the neck. Skeleton: Multilevel degenerative changes are present throughout the cervical spine. Ankylosis present C5-6. Slight degenerative anterolisthesis present at C2-3 and C3-4. Marked endplate sclerotic changes present at C6-7.  Osseous foraminal narrowing present bilaterally at C4-5, C5-6 and on the right at C6-7. Other neck: The soft tissues of the neck are otherwise unremarkable. Salivary glands are within normal limits. Thyroid is normal. No significant adenopathy is present. No focal mucosal or submucosal lesions are present. Upper chest: The lung apices are clear. The thoracic inlet is within normal limits. Review of the MIP images confirms the above findings CTA HEAD FINDINGS Anterior circulation: Minimal calcifications are present within the cavernous internal carotid arteries bilaterally. No significant stenosis is present through the ICA termini. The A1 and M1 segments are normal. The anterior communicating artery is patent. The MCA bifurcations are normal bilaterally. The ACA and MCA branch vessels are normal bilaterally. Posterior circulation: Atherosclerotic calcifications are present at the dural margin of the left vertebral artery. PICA origins are visualized and normal. The vertebrobasilar junction and basilar artery are normal. Both posterior cerebral arteries originate from the scratched at the superior cerebellar arteries are patent bilaterally. Both posterior cerebral arteries originate from basilar tip. Mild narrowing is present in the mid left P2 segment. The PCA branch vessels are otherwise within normal limits. No aneurysm is present. Venous sinuses: The dural sinuses are patent. The straight sinus and deep cerebral veins are intact. Cortical veins are within normal limits. No significant vascular malformation is evident. Anatomic variants: None Review of the MIP images confirms the above findings IMPRESSION: 1. High-grade calcified stenosis of the right carotid bifurcation. 2. Dense calcifications at the left carotid bifurcation without significant stenosis relative to the more distal vessel. 3. Moderate stenosis at the dominant right vertebral artery origin. 4. 50% stenosis of the proximal left subclavian artery.  5. Mild narrowing in the mid left P2 segment. 6. No other significant proximal stenosis, aneurysm, or branch vessel occlusion within the Circle of Willis. 7. Multilevel degenerative changes throughout the cervical spine. 8.  Aortic Atherosclerosis (ICD10-I70.0). Electronically Signed   By: Marin Roberts M.D.   On: 09/10/2023 19:32   CT HEAD CODE STROKE WO CONTRAST Result Date: 09/10/2023 CLINICAL DATA:  Code stroke. Neuro deficit, acute, stroke suspected. Patient discharged yesterday following hospitalization for acute pontine infarct. EXAM: CT HEAD WITHOUT CONTRAST TECHNIQUE: Contiguous axial images were obtained from the base of the skull through the vertex without intravenous contrast. RADIATION DOSE REDUCTION: This exam was performed according to the departmental dose-optimization program  which includes automated exposure control, adjustment of the mA and/or kV according to patient size and/or use of iterative reconstruction technique. COMPARISON:  CT head without contrast 09/03/2023. MR head without contrast 09/04/2023. FINDINGS: Brain: The study is moderately degraded by patient motion. The right paramedian pontine infarct is noted. Moderate generalized atrophy and white matter disease is otherwise stable. The ventricles are proportionate to the degree of atrophy. Chronic encephalomalacia is again noted at the left temporal tip. The brainstem and cerebellum are otherwise within normal limits. Midline structures are within normal limits. Vascular: Atherosclerotic calcifications are present within the cavernous internal carotid arteries and at the dural margin of both vertebral arteries. No hyperdense vessel is present. Skull: Calvarium is intact. No focal lytic or blastic lesions are present. No significant extracranial soft tissue lesion is present. Sinuses/Orbits: The paranasal sinuses and mastoid air cells are clear. The globes and orbits are within normal limits. ASPECTS [facility] Stroke Program  Early CT Score) - Ganglionic level infarction (caudate, lentiform nuclei, internal capsule, insula, M1-M3 cortex): 7/7 - Supraganglionic infarction (M4-M6 cortex): 3/3 Total score (0-10 with 10 being normal): 10/10 IMPRESSION: 1. Right paramedian pontine infarct. 2. Stable atrophy and white matter disease. This likely reflects the sequela of chronic microvascular ischemia. 3. Chronic encephalomalacia at the left temporal tip. 4. No acute intracranial abnormality or significant interval change. The above was relayed via text pager to Dr. Iver Nestle on 09/10/2023 at 18:40 . Electronically Signed   By: Marin Roberts M.D.   On: 09/10/2023 18:41    Procedures .Ultrasound ED Peripheral IV (Provider)  Date/Time: 09/10/2023 6:47 PM  Performed by: Rondel Baton, MD Authorized by: Rondel Baton, MD   Procedure details:    Indications: multiple failed IV attempts and poor IV access     Skin Prep: chlorhexidine gluconate     Location:  Left AC   Angiocath:  18 G   Bedside Ultrasound Guided: Yes     Images: not archived     Patient tolerated procedure without complications: Yes     Dressing applied: Yes        Medications Ordered in ED Medications   stroke: early stages of recovery book (has no administration in time range)  pantoprazole (PROTONIX) EC tablet 40 mg (40 mg Oral Given 09/10/23 2006)  amiodarone (PACERONE) tablet 200 mg (has no administration in time range)  atorvastatin (LIPITOR) tablet 40 mg (40 mg Oral Given 09/10/23 2259)  memantine (NAMENDA) tablet 10 mg (has no administration in time range)  polyethylene glycol (MIRALAX / GLYCOLAX) packet 17 g (has no administration in time range)  multivitamin with minerals tablet 1 tablet (has no administration in time range)  acetaminophen (TYLENOL) tablet 650 mg (has no administration in time range)    Or  acetaminophen (TYLENOL) 160 MG/5ML solution 650 mg (has no administration in time range)    Or  acetaminophen (TYLENOL)  suppository 650 mg (has no administration in time range)  sodium chloride flush (NS) 0.9 % injection 3-10 mL (10 mLs Intravenous Given 09/10/23 2321)  sodium chloride flush (NS) 0.9 % injection 3-10 mL (has no administration in time range)  lactated ringers infusion ( Intravenous New Bag/Given 09/10/23 2011)  sodium chloride flush (NS) 0.9 % injection 3 mL (3 mLs Intravenous Given 09/10/23 1959)  iohexol (OMNIPAQUE) 350 MG/ML injection 75 mL (75 mLs Intravenous Contrast Given 09/10/23 1907)  aspirin tablet 325 mg ( Oral Canceled Entry 09/10/23 2137)    ED Course/ Medical Decision Making/ A&P Clinical Course  as of 09/11/23 0118  Sun Sep 10, 2023  1853 Dr Roda Shutters from neurology at the bedside [RP]  2010 Dr Janalyn Shy from hospitalist to admit the patient [RP]    Clinical Course User Index [RP] Rondel Baton, MD                                 Medical Decision Making Amount and/or Complexity of Data Reviewed Labs: ordered. Radiology: ordered.  Risk Decision regarding hospitalization.   DENNISON MCDAID is a 89 y.o. male with comorbidities that complicate the patient evaluation including Alzheimer's dementia, pontine stroke, GI bleed, and CAD who presents to the emergency department with right-sided weakness and difficulty speaking  Initial Ddx:  Stroke, ICH, hypoglycemia, meningitis/infection  MDM:  Concerned about a stroke given the patient's symptoms.  Patient is protecting airway at this time.  Code stroke was activated in the field.  Unfortunately because of his recent stroke and GI bleed he is not a TNK candidate but will undergo workup for LVO.  Will check a CBG to ensure that the patient is not hypoglycemic and a CT of the head to evaluate for ICH.  No signs of infection currently on history or exam to suggest an infectious cause of their symptoms.  Plan:  CBG Labs Code stroke activation CT head  ED Summary/Re-evaluation:  Patient did not had an LVO.  Noncon head CT without ICH.   Suspected by neurology that he may have had another stroke.  Neurology recommended admission to hospitalist for further management.  This patient presents to the ED for concern of complaints listed in HPI, this involves an extensive number of treatment options, and is a complaint that carries with it a high risk of complications and morbidity. Disposition including potential need for admission considered.   Dispo: Admit to Floor  Additional history obtained from EMS Records reviewed Outpatient Clinic Notes The following labs were independently interpreted: Chemistry and show CKD I independently reviewed the following imaging with scope of interpretation limited to determining acute life threatening conditions related to emergency care: CT Head and agree with the radiologist interpretation with the following exceptions: none I personally reviewed and interpreted the pt's EKG: see above for interpretation  I have reviewed the patients home medications and made adjustments as needed Consults: Hospitalist and Neurology Social Determinants of health:  Elderly  CRITICAL CARE Performed by: Rondel Baton   Total critical care time: 30 minutes  Critical care time was exclusive of separately billable procedures and treating other patients.  Critical care was necessary to treat or prevent imminent or life-threatening deterioration.  Critical care was time spent personally by me on the following activities: development of treatment plan with patient and/or surrogate as well as nursing, discussions with consultants, evaluation of patient's response to treatment, examination of patient, obtaining history from patient or surrogate, ordering and performing treatments and interventions, ordering and review of laboratory studies, ordering and review of radiographic studies, pulse oximetry and re-evaluation of patient's condition.   Final Clinical Impression(s) / ED Diagnoses Final diagnoses:  Acute  ischemic stroke [facility])    Rx / DC Orders ED Discharge Orders     None         Rondel Baton, MD 09/11/23 [phone]

## 2023-09-11 ENCOUNTER — Observation Stay (HOSPITAL_COMMUNITY)

## 2023-09-11 ENCOUNTER — Other Ambulatory Visit: Payer: Self-pay

## 2023-09-11 ENCOUNTER — Encounter (HOSPITAL_COMMUNITY): Payer: Self-pay | Admitting: Gastroenterology

## 2023-09-11 DIAGNOSIS — I63412 Cerebral infarction due to embolism of left middle cerebral artery: Secondary | ICD-10-CM | POA: Diagnosis not present

## 2023-09-11 DIAGNOSIS — Z515 Encounter for palliative care: Secondary | ICD-10-CM

## 2023-09-11 DIAGNOSIS — F0390 Unspecified dementia without behavioral disturbance: Secondary | ICD-10-CM | POA: Diagnosis not present

## 2023-09-11 DIAGNOSIS — Z66 Do not resuscitate: Secondary | ICD-10-CM

## 2023-09-11 DIAGNOSIS — I639 Cerebral infarction, unspecified: Secondary | ICD-10-CM | POA: Diagnosis not present

## 2023-09-11 DIAGNOSIS — Z7189 Other specified counseling: Secondary | ICD-10-CM | POA: Diagnosis not present

## 2023-09-11 LAB — CBC
HCT: 27.4 % — ABNORMAL LOW (ref 39.0–52.0)
Hemoglobin: 9.3 g/dL — ABNORMAL LOW (ref 13.0–17.0)
MCH: 31.4 pg (ref 26.0–34.0)
MCHC: 33.9 g/dL (ref 30.0–36.0)
MCV: 92.6 fL (ref 80.0–100.0)
Platelets: 228 10*3/uL (ref 150–400)
RBC: 2.96 MIL/uL — ABNORMAL LOW (ref 4.22–5.81)
RDW: 13.8 % (ref 11.5–15.5)
WBC: 5.5 10*3/uL (ref 4.0–10.5)
nRBC: 0 % (ref 0.0–0.2)

## 2023-09-11 LAB — BASIC METABOLIC PANEL
Anion gap: 7 (ref 5–15)
BUN: 17 mg/dL (ref 8–23)
CO2: 25 mmol/L (ref 22–32)
Calcium: 8.5 mg/dL — ABNORMAL LOW (ref 8.9–10.3)
Chloride: 106 mmol/L (ref 98–111)
Creatinine, Ser: 1.2 mg/dL (ref 0.61–1.24)
GFR, Estimated: 59 mL/min — ABNORMAL LOW (ref >60)
Glucose, Bld: 117 mg/dL — ABNORMAL HIGH (ref 70–99)
Potassium: 3.5 mmol/L (ref 3.5–5.1)
Sodium: 138 mmol/L (ref 135–145)

## 2023-09-11 LAB — GLUCOSE, CAPILLARY
Glucose-Capillary: 114 mg/dL — ABNORMAL HIGH (ref 70–99)
Glucose-Capillary: 121 mg/dL — ABNORMAL HIGH (ref 70–99)
Glucose-Capillary: 137 mg/dL — ABNORMAL HIGH (ref 70–99)
Glucose-Capillary: 140 mg/dL — ABNORMAL HIGH (ref 70–99)
Glucose-Capillary: 157 mg/dL — ABNORMAL HIGH (ref 70–99)

## 2023-09-11 LAB — SODIUM, URINE, RANDOM: Sodium, Ur: 96 mmol/L

## 2023-09-11 LAB — URINALYSIS, ROUTINE W REFLEX MICROSCOPIC
Bilirubin Urine: NEGATIVE
Glucose, UA: 100 mg/dL — AB
Ketones, ur: NEGATIVE mg/dL
Leukocytes,Ua: NEGATIVE
Nitrite: NEGATIVE
Protein, ur: NEGATIVE mg/dL
Specific Gravity, Urine: 1.01 (ref 1.005–1.030)
pH: 6 (ref 5.0–8.0)

## 2023-09-11 LAB — LIPID PANEL
Cholesterol: 130 mg/dL (ref 0–200)
HDL: 50 mg/dL (ref >40)
LDL Cholesterol: 70 mg/dL (ref 0–99)
Total CHOL/HDL Ratio: 2.6 ratio
Triglycerides: 52 mg/dL (ref <150)
VLDL: 10 mg/dL (ref 0–40)

## 2023-09-11 LAB — URINALYSIS, MICROSCOPIC (REFLEX)
Bacteria, UA: NONE SEEN
Squamous Epithelial / HPF: NONE SEEN /HPF (ref 0–5)

## 2023-09-11 LAB — CREATININE, URINE, RANDOM: Creatinine, Urine: 84 mg/dL

## 2023-09-11 MED ORDER — LORAZEPAM 2 MG/ML IJ SOLN
1.0000 mg | Freq: Once | INTRAMUSCULAR | Status: AC
  Administered 2023-09-11: 1 mg via INTRAVENOUS
  Filled 2023-09-11: qty 1

## 2023-09-11 MED ORDER — PANTOPRAZOLE SODIUM 40 MG IV SOLR
40.0000 mg | INTRAVENOUS | Status: DC
  Administered 2023-09-11: 40 mg via INTRAVENOUS
  Filled 2023-09-11: qty 10

## 2023-09-11 NOTE — Evaluation (Addendum)
 Physical Therapy Evaluation Patient Details Name: WARDEN BUFFA MRN: [id number] DOB: [birth year] Today's Date: 09/11/2023  History of Present Illness  Pt is an 89 y.o. male admitted 3/2 with new onset R side weakness. CT negative. MRI pending. Recent hospitalization 2/24-3/1 for R pontine CVA. PMH: a fib, DM, HTN, Alzheimer's, CAD   Clinical Impression  Pt admitted with above diagnosis. PTA pt lived at home with wife, amb household distances with RW. Pt currently with functional limitations due to the deficits listed below (see PT Problem List). On eval, pt required min assist bed mobility, and mod assist sit to stand with RW. Unable to progress RLE for gait. Unable to grip RW with R hand. Pt will benefit from acute skilled PT to increase their independence and safety with mobility to allow discharge. Upon d/c, pt would benefit from further therapy in inpatient setting <3 hours/day.          If plan is discharge home, recommend the following: Two people to help with walking and/or transfers;Two people to help with bathing/dressing/bathroom   Can travel by private vehicle   No    Equipment Recommendations None recommended by PT  Recommendations for Other Services       Functional Status Assessment Patient has had a recent decline in their functional status and/or demonstrates limited ability to make significant improvements in function in a reasonable and predictable amount of time     Precautions / Restrictions Precautions Precautions: Fall Recall of Precautions/Restrictions: Impaired      Mobility  Bed Mobility Overal bed mobility: Needs Assistance Bed Mobility: Supine to Sit, Sit to Supine     Supine to sit: Min assist, HOB elevated, Used rails Sit to supine: Min assist, HOB elevated, Used rails   General bed mobility comments: increased time    Transfers Overall transfer level: Needs assistance Equipment used: Rolling walker (2 wheels) Transfers: Sit to/from  Stand Sit to Stand: Mod assist           General transfer comment: assist to power up and stabilize balance    Ambulation/Gait               General Gait Details: unable to progress RLE for gait/sidestepping  Stairs            Wheelchair Mobility     Tilt Bed    Modified Rankin (Stroke Patients Only) Modified Rankin (Stroke Patients Only) Pre-Morbid Rankin Score: Moderately severe disability Modified Rankin: Severe disability     Balance Overall balance assessment: Needs assistance Sitting-balance support: Feet supported, No upper extremity supported Sitting balance-Leahy Scale: Fair     Standing balance support: Bilateral upper extremity supported, During functional activity, Reliant on assistive device for balance Standing balance-Leahy Scale: Poor                               Pertinent Vitals/Pain Pain Assessment Pain Assessment: Faces Faces Pain Scale: No hurt    Home Living Family/patient expects to be discharged to:: Private residence Living Arrangements: Spouse/significant other Available Help at Discharge: Family;Available 24 hours/day Type of Home: House Home Access: Ramped entrance       Home Layout: One level Home Equipment: Rollator (4 wheels);BSC/3in1;Wheelchair - manual;Hand held shower head (wheelchair in disrepair; wife plans to address through [place])      Prior Function Prior Level of Function : Needs assist  Cognitive Assist : Mobility (cognitive);ADLs (cognitive) Mobility (Cognitive): Intermittent cues  Mobility Comments: walking independently without device inside home; wife pushes him in wheelchair or he walks pushing a cart in the store ADLs Comments: Wife assists with bathing and dressing, gives visual demonstration to help pt engage in ADLs. Can perform simple grooming IE washing his face     Extremity/Trunk Assessment   Upper Extremity Assessment Upper Extremity Assessment: Difficult to assess  due to impaired cognition;Right hand dominant;RUE deficits/detail;Generalized weakness RUE Deficits / Details: unable to grip RW    Lower Extremity Assessment Lower Extremity Assessment: Generalized weakness (R>L)    Cervical / Trunk Assessment Cervical / Trunk Assessment: Normal  Communication   Communication Communication: Impaired Factors Affecting Communication: Difficulty expressing self;Reduced clarity of speech;Hearing impaired    Cognition Arousal: Alert Behavior During Therapy: Flat affect   PT - Cognitive impairments: History of cognitive impairments                         Following commands: Impaired Following commands impaired: Follows one step commands inconsistently, Follows one step commands with increased time     Cueing Cueing Techniques: Verbal cues, Gestural cues, Visual cues     General Comments General comments (skin integrity, edema, etc.): Wife present. Reports she feels overwhelmed and exhausted by level of care pt is needing.    Exercises     Assessment/Plan    PT Assessment Patient needs continued PT services  PT Problem List Decreased strength;Decreased activity tolerance;Decreased balance;Decreased mobility;Decreased cognition;Decreased safety awareness       PT Treatment Interventions Gait training;Functional mobility training;Therapeutic activities;Therapeutic exercise;Balance training;Patient/family education    PT Goals (Current goals can be found in the Care Plan section)  Acute Rehab PT Goals Patient Stated Goal: rehab per wife PT Goal Formulation: With family Time For Goal Achievement: 09/25/23 Potential to Achieve Goals: Fair    Frequency Min 1X/week     Co-evaluation               AM-PAC PT "6 Clicks" Mobility  Outcome Measure Help needed turning from your back to your side while in a flat bed without using bedrails?: A Little Help needed moving from lying on your back to sitting on the side of a flat bed  without using bedrails?: A Little Help needed moving to and from a bed to a chair (including a wheelchair)?: A Lot Help needed standing up from a chair using your arms (e.g., wheelchair or bedside chair)?: A Lot Help needed to walk in hospital room?: Total Help needed climbing 3-5 steps with a railing? : Total 6 Click Score: 12    End of Session Equipment Utilized During Treatment: Gait belt Activity Tolerance: Patient tolerated treatment well Patient left: in bed;with call bell/phone within reach;with bed alarm set;with family/visitor present Nurse Communication: Mobility status PT Visit Diagnosis: Other abnormalities of gait and mobility (R26.89)    Time: 2595-6387 PT Time Calculation (min) (ACUTE ONLY): 12 min   Charges:   PT Evaluation $PT Eval Moderate Complexity: 1 Mod   PT General Charges $$ ACUTE PT VISIT: 1 Visit         Ferd Glassing., PT  Office # [phone]   Ilda Foil 09/11/2023, 10:34 AM

## 2023-09-11 NOTE — Care Management Obs Status (Signed)
 MEDICARE OBSERVATION STATUS NOTIFICATION   Patient Details  Name: Alexander Donaldson MRN: [id number] Date of Birth: [birth year]   Medicare Observation Status Notification Given:  Yes    Baldemar Lenis, LCSW 09/11/2023, 2:49 PM

## 2023-09-11 NOTE — Plan of Care (Addendum)
 Pending MRI, order for pre-medication received.   1500 Patient unable to lay still for MRI. Dispo SNF with palliative service.   Problem: Ischemic Stroke/TIA Tissue Perfusion: Goal: Complications of ischemic stroke/TIA will be minimized Outcome: Not Met (add Reason)   Problem: Coping: Goal: Will verbalize positive feelings about self Outcome: Not Met (add Reason) Goal: Will identify appropriate support needs Outcome: Not Met (add Reason)   Problem: Coping: Goal: Level of anxiety will decrease Outcome: Not Met (add Reason)   Problem: Elimination: Goal: Will not experience complications related to bowel motility Outcome: Not Met (add Reason) Goal: Will not experience complications related to urinary retention Outcome: Not Met (add Reason)   Problem: Pain Managment: Goal: General experience of comfort will improve and/or be controlled Outcome: Not Met (add Reason)   Problem: Safety: Goal: Ability to remain free from injury will improve Outcome: Not Met (add Reason)   Problem: Skin Integrity: Goal: Risk for impaired skin integrity will decrease Outcome: Not Met (add Reason)

## 2023-09-11 NOTE — Consult Note (Signed)
 Palliative Care Consult Note                                  Date: 09/11/2023   Patient Name: Alexander Donaldson  DOB: April 13, 1934  MRN: [id number]  Age / Sex: 89 y.o., male  [facility]P: [facility] Referring Physician: Willeen Niece, MD  Reason for Consultation: Establishing goals of care  HPI/Patient Profile: 89 y.o. male  with past medical history of dementia, atrial fibrillation on Eliquis, CAD with prior MI, diabetes type 2, spinal stenosis and sciatica, HTN, and HLD. He arrived via EMS from home 09/10/2023 as a code stroke with complaint of right-sided weakness and facial droop. [name] had previous admission at [facility] 09/03/2023- 09/09/2023 with acute right pontine CVA, acute blood loss anemia and suspected upper GI bleed. Decision was made at that time to discontinue Eliquis and ASA. Palliative care team was consulted during previous admission and assisted wife with goals of care including completion of DNR/DNI and MOST form.   Palliative care consulted for present admission to discuss further goals of care in the setting of acute CVA and hx of GIB, not a candidate for anticoagulation due to high risk for bleeding.   Past Medical History:  Diagnosis Date   Acute encephalopathy 02/12/2018   Alzheimer's dementia (HCC)    CAD (coronary artery disease)    CAP (community acquired pneumonia) 02/11/2018   Carpal tunnel syndrome, bilateral 02/25/2020   Coronary artery disease    Diabetes mellitus    Diverticulitis    Hearing loss    Hypertension    Hypokalemia 02/12/2018   NSTEMI (non-ST elevated myocardial infarction) [facility])     Subjective:   [name] is alert. He is pleasantly confused. He is in no distress.  I have reviewed medical records including EPIC notes, labs and imaging, received report from the team, and assessed the patient at bedside.   I met with the patient, Tymothy Cass (spouse) and Ferd Hibbs. (Son), to  discuss diagnosis, prognosis, GOC, EOL wishes, disposition, and options.  I introduced Palliative Medicine as specialized medical care for people living with serious illness. It focuses on providing relief from the symptoms and stress of a serious illness and serves as a support system for patient's and their families.    We discussed patient's current illness and what it means in the larger context of his/her ongoing co-morbidities. Current clinical status was reviewed. Natural disease trajectory of recurrent CVA's were discussed.  Created space and opportunity for patient and family to explore thoughts and feelings regarding current medical situation.  Values and goals of care important to patient and family were attempted to be elicited.  A discussion was had today regarding advanced directives to continue DNR code status, artifical feeding and hydration, continued IV antibiotics and rehospitalization. Clydie Braun states that she had misplaced previous MOST form. MOST was completed with Clydie Braun, scanned into [employer], originals placed into chart. A copy of both forms were provided to wife per her request.   Questions and concerns addressed. Patient/family encouraged to call with questions or concerns.     Functional Status: At baseline, at home, patient was ambulatory with walker. His wife assists with his basic ADLs with the exception that he feeds himself. Clydie Braun reports that Isiac eats well and has a good appetite. She is concerned that Eliseo now will require additional help with ambulating and she will not be able  to help him as she did before this admission. Patient receives personal care services through the [place]. Currently 11 hours per week. Even with this help, she feels that his change in status after this last stroke may be too much for her to handle alone.   Goals: Clydie Braun expresses that she wants her husband to work with physical therapy even if that means going to a SNF. She would like to see if  working with PT, he may regain some use of his right side with the intention of returning home with palliative services. Clydie Braun mentions wanting to see if he could possibly go to [facility] near their home. There was much discussion of palliative care services in the home or palliative support in a facility. She understands that this is a limited service, but in the future, he could transition from OP palliative to hospice care if needed.     Reexamined and completed a MOST form today due to previous document being lost. The family outlined their wishes for the following treatment decisions:  Cardiopulmonary Resuscitation: Do Not Attempt Resuscitation (DNR/No CPR)  Medical Interventions: Limited Additional Interventions: Use medical treatment, IV fluids and cardiac monitoring as indicated, DO NOT USE intubation or mechanical ventilation. May consider use of less invasive airway support such as BiPAP or CPAP. Also provide comfort measures. Transfer to the hospital if indicated. Avoid intensive care.   Antibiotics: Antibiotics if indicated  IV Fluids: IV fluids if indicated  Feeding Tube: No feeding tube       Review of Systems  Unable to perform ROS: Dementia    Objective:   Primary Diagnoses: Present on Admission:  Paroxysmal atrial fibrillation (HCC)  Iron deficiency anemia due to chronic blood loss  Essential hypertension  Acute kidney injury superimposed on stage 3b chronic kidney disease (HCC)  Dementia without behavioral disturbance (HCC)  Acute CVA (cerebrovascular accident) (HCC)  Chronic diastolic CHF (congestive heart failure) (HCC)   Physical Exam Vitals reviewed.  Constitutional:      General: He is not in acute distress.    Appearance: He is not ill-appearing.  Pulmonary:     Effort: Pulmonary effort is normal. No respiratory distress.  Skin:    General: Skin is warm and dry.  Neurological:     Mental Status: He is alert. Mental status is at baseline. He is  disoriented.     Vital Signs:  BP (!) 142/59 (BP Location: Right Arm)   Pulse 60   Temp 97.7 F (36.5 C) (Axillary)   Resp 18   Wt 70.8 kg   SpO2 100%   BMI 26.79 kg/m   Palliative Assessment/Data: 40-50%     Assessment & Plan:   SUMMARY OF RECOMMENDATIONS   Continue DNR/DNI Continue supportive interventions - treat the treatable TOC consult placed for SNF placement with palliative to follow  PMT will continue to follow  Primary Decision Maker: Clydie Braun- spouse  Code Status/Advance Care Planning: DNR  Prognosis:  Unable to determine  Discharge Planning:  To Be Determined   Discussed with: Dr. Carmelina Noun, [facility] and primary RN, Nicholes Calamity    Thank you for allowing [place] to participate in the care of NICK STULTS   Time Total: 55 minutes   Greater than 50%  of this time was spent counseling and coordinating care related to the above assessment and plan.     Alex Gardener, Juel Burrow [facility] Palliative Medicine Team  09/11/2023 12:15 PM  Office [phone]  Pager [phone]    For  individual providers, please see AMION

## 2023-09-11 NOTE — [place] FL2 (Signed)
  MEDICAID FL2 LEVEL OF CARE FORM     IDENTIFICATION  Patient Name: Alexander Donaldson Birthdate: [birth year] Sex: male Admission Date (Current Location): 09/10/2023  [facility] and [place] Number:  Producer, television/film/video and Address:  [facility] [place], [address] [place], [place] [address]      Provider Number: [id number]  Attending Physician Name and Address:  Willeen Niece, MD  Relative Name and Phone Number:       Current Level of Care: Hospital Recommended Level of Care: Skilled Nursing Facility Prior Approval Number:    Date Approved/Denied:   PASRR Number: [id number]  Discharge Plan: SNF    Current Diagnoses: Patient Active Problem List   Diagnosis Date Noted   CVA (cerebral vascular accident) (HCC) 09/10/2023   History of CVA 09/04/2023 (cerebrovascular accident) 09/10/2023   History of GI bleed 09/10/2023   Acute CVA (cerebrovascular accident) (HCC) 09/10/2023   Iron deficiency anemia due to chronic blood loss 09/07/2023   Heme positive stool 09/07/2023   TIA (transient ischemic attack) 09/04/2023   Acute upper GI bleed 09/04/2023   Acute blood loss anemia 09/04/2023   Facial droop 09/04/2023   Weakness of right upper extremity 09/04/2023   Acute kidney injury superimposed on stage 3b chronic kidney disease (HCC) 09/04/2023   Paroxysmal atrial fibrillation (HCC) 09/04/2023   Chronic diastolic CHF (congestive heart failure) (HCC) 09/04/2023   Atrial fibrillation with RVR (HCC) 07/02/2023   NSTEMI, initial episode of care (HCC) 07/02/2023   Exposure to COVID-19 virus 03/23/2023   Dysphagia, unspecified 02/01/2023   Encounter for screening for eye and ear disorders 02/01/2023   Arthralgia 07/05/2022   NSTEMI (non-ST elevated myocardial infarction) (HCC)    Accelerated junctional rhythm 01/30/2022   Dementia without behavioral disturbance (HCC) 01/30/2022   Carpal tunnel syndrome, bilateral 02/25/2020   Numbness 11/28/2019    Raynaud's phenomenon 10/30/2019   CAD (coronary artery disease) 02/12/2018   Left foot drop 02/12/2018   Non-insulin dependent type 2 diabetes mellitus (HCC) 02/12/2018   Essential hypertension 02/12/2018   Hypokalemia 02/12/2018    Orientation RESPIRATION BLADDER Height & Weight     Self  Normal Incontinent Weight: 156 lb 1.4 oz (70.8 kg) Height:     BEHAVIORAL SYMPTOMS/MOOD NEUROLOGICAL BOWEL NUTRITION STATUS      Continent, Incontinent Diet (heart healthy/carb modified)  AMBULATORY STATUS COMMUNICATION OF NEEDS Skin   Extensive Assist Verbally Normal                       Personal Care Assistance Level of Assistance  Bathing, Feeding, Dressing Bathing Assistance: Maximum assistance Feeding assistance: Limited assistance Dressing Assistance: Maximum assistance     Functional Limitations Info  Speech     Speech Info: Impaired    SPECIAL CARE FACTORS FREQUENCY  PT (By licensed PT), OT (By licensed OT)     PT Frequency: 5x/wk OT Frequency: 5x/wk            Contractures Contractures Info: Not present    Additional Factors Info  Code Status, Allergies Code Status Info: DNR Allergies Info: Lisinopril, Metformin           Current Medications (09/11/2023):  This is the current hospital active medication list Current Facility-Administered Medications  Medication Dose Route Frequency Provider Last Rate Last Admin    stroke: early stages of recovery book   Does not apply Once Sundil, Subrina, MD       acetaminophen (TYLENOL) tablet 650 mg  650 mg Oral Q4H PRN Tereasa Coop, MD       Or   acetaminophen (TYLENOL) 160 MG/5ML solution 650 mg  650 mg Per Tube Q4H PRN Janalyn Shy, Subrina, MD       Or   acetaminophen (TYLENOL) suppository 650 mg  650 mg Rectal Q4H PRN Janalyn Shy, Subrina, MD       amiodarone (PACERONE) tablet 200 mg  200 mg Oral Daily Sundil, Subrina, MD   200 mg at 09/11/23 0909   atorvastatin (LIPITOR) tablet 40 mg  40 mg Oral Daily Sundil, Subrina, MD    40 mg at 09/11/23 4098   lactated ringers infusion   Intravenous Continuous Sundil, Subrina, MD 75 mL/hr at 09/11/23 1220 New Bag at 09/11/23 1220   memantine (NAMENDA) tablet 10 mg  10 mg Oral BID Janalyn Shy, Subrina, MD   10 mg at 09/11/23 1191   multivitamin with minerals tablet 1 tablet  1 tablet Oral Daily Janalyn Shy, Subrina, MD   1 tablet at 09/11/23 0909   pantoprazole (PROTONIX) EC tablet 40 mg  40 mg Oral BID Janalyn Shy, Subrina, MD   40 mg at 09/11/23 4782   polyethylene glycol (MIRALAX / GLYCOLAX) packet 17 g  17 g Oral Daily PRN Janalyn Shy, Subrina, MD       sodium chloride flush (NS) 0.9 % injection 3-10 mL  3-10 mL Intravenous Q12H Sundil, Subrina, MD   10 mL at 09/11/23 0910   sodium chloride flush (NS) 0.9 % injection 3-10 mL  3-10 mL Intravenous PRN Tereasa Coop, MD         Discharge Medications: Please see discharge summary for a list of discharge medications.  Relevant Imaging Results:  Relevant Lab Results:   Additional Information SS#: [id number]  Baldemar Lenis, LCSW

## 2023-09-11 NOTE — Evaluation (Signed)
 Occupational Therapy Evaluation Patient Details Name: Alexander Donaldson MRN: [id number] DOB: 01/15/1934 Today's Date: 09/11/2023   History of Present Illness   Pt is an 89 y.o. male admitted 3/2 with new onset R side weakness. CT negative. MRI pending. Recent hospitalization 2/24-3/1 for R pontine CVA. PMH: a fib, DM, HTN, Alzheimer's, CAD     Clinical Impressions Prior to admission, pt was receiving assist with all adls due to cognitive decline.  He was able to be redirected and could assist with his mobility.  He currently has more RUE weakness and decreased ability with bed mobility and ambulation increasing caregiver burden. Spoke with wife and son about plan for Hospice to assist at home and both expressed concern about caring for him at this level.  Patient will benefit from continued inpatient follow up therapy, <3 hours/day as well as continued OT on acute.       If plan is discharge home, recommend the following:   A lot of help with walking and/or transfers;A lot of help with bathing/dressing/bathroom;Assistance with cooking/housework;Assistance with feeding;Direct supervision/assist for medications management;Direct supervision/assist for financial management;Assist for transportation;Help with stairs or ramp for entrance;Supervision due to cognitive status     Functional Status Assessment   Patient has had a recent decline in their functional status and/or demonstrates limited ability to make significant improvements in function in a reasonable and predictable amount of time     Equipment Recommendations   Hospital bed     Recommendations for Other Services         Precautions/Restrictions   Precautions Precautions: Fall Recall of Precautions/Restrictions: Impaired Restrictions Weight Bearing Restrictions Per Provider Order: No     Mobility Bed Mobility Overal bed mobility: Needs Assistance Bed Mobility: Supine to Sit, Sit to Supine     Supine to sit:  Mod assist Sit to supine: Mod assist        Transfers                          Balance Overall balance assessment: Needs assistance Sitting-balance support: Feet supported, No upper extremity supported Sitting balance-Leahy Scale: Fair                                     ADL either performed or assessed with clinical judgement   ADL Overall ADL's : Needs assistance/impaired Eating/Feeding: Sitting;Set up;Supervision/ safety   Grooming: Moderate assistance;Cueing for sequencing;Wash/dry hands;Wash/dry face;Oral care   Upper Body Bathing: Maximal assistance;Sitting;Standing   Lower Body Bathing: Maximal assistance;Sitting/lateral leans;+2 for safety/equipment   Upper Body Dressing : Cueing for sequencing;Sitting   Lower Body Dressing: Maximal assistance;+2 for safety/equipment   Toilet Transfer: +2 for safety/equipment;Moderate assistance   Toileting- Clothing Manipulation and Hygiene: Maximal assistance;+2 for physical assistance         General ADL Comments: Patient needs constant cueing to complete an activity.     Vision Baseline Vision/History: 1 Wears glasses       Perception         Praxis         Pertinent Vitals/Pain Pain Assessment Pain Assessment: Faces Faces Pain Scale: No hurt     Extremity/Trunk Assessment Upper Extremity Assessment Upper Extremity Assessment: RUE deficits/detail;Difficult to assess due to impaired cognition RUE Deficits / Details: Patient with some actve movement in the RUE, but unable to activate on command.  Grip is 2/5  Lower Extremity Assessment Lower Extremity Assessment: Defer to PT evaluation   Cervical / Trunk Assessment Cervical / Trunk Assessment: Normal   Communication Communication Communication: Impaired Factors Affecting Communication: Difficulty expressing self;Reduced clarity of speech;Hearing impaired   Cognition Arousal: Alert Behavior During Therapy: Restless                OT - Cognition Comments: Hx of alzheimers, pt also very HOH                 Following commands: Impaired Following commands impaired: Follows one step commands inconsistently, Follows one step commands with increased time     Cueing  General Comments   Cueing Techniques: Verbal cues;Gestural cues;Visual cues      Exercises     Shoulder Instructions      Home Living Family/patient expects to be discharged to:: Private residence Living Arrangements: Spouse/significant other Available Help at Discharge: Family;Available 24 hours/day Type of Home: House Home Access: Ramped entrance     Home Layout: One level     Bathroom Shower/Tub: Producer, television/film/video: Standard Bathroom Accessibility: Yes How Accessible: Accessible via walker Home Equipment: Rollator (4 wheels);BSC/3in1;Wheelchair - manual;Hand held shower head;Shower seat          Prior Functioning/Environment Prior Level of Function : Needs assist  Cognitive Assist : Mobility (cognitive);ADLs (cognitive) Mobility (Cognitive): Intermittent cues         Mobility Comments: walking independently without device inside home; wife pushes him in wheelchair or he walks pushing a cart in the store ADLs Comments: Wife assists with bathing and dressing, gives visual demonstration to help pt engage in ADLs. Can perform simple grooming IE washing his face    OT Problem List: Decreased range of motion;Decreased strength;Impaired balance (sitting and/or standing);Decreased cognition;Decreased safety awareness;Impaired UE functional use   OT Treatment/Interventions:        OT Goals(Current goals can be found in the care plan section)   Acute Rehab OT Goals Patient Stated Goal: Unable OT Goal Formulation: With family Time For Goal Achievement: 09/25/23   OT Frequency:  Min 1X/week    Co-evaluation              AM-PAC OT "6 Clicks" Daily Activity     Outcome Measure Help from  another person eating meals?: A Little Help from another person taking care of personal grooming?: A Lot Help from another person toileting, which includes using toliet, bedpan, or urinal?: A Lot Help from another person bathing (including washing, rinsing, drying)?: A Lot Help from another person to put on and taking off regular upper body clothing?: A Lot Help from another person to put on and taking off regular lower body clothing?: A Lot 6 Click Score: 13   End of Session Nurse Communication: Mobility status  Activity Tolerance: Patient tolerated treatment well Patient left: in bed;with call bell/phone within reach;with bed alarm set;with family/visitor present  OT Visit Diagnosis: Unsteadiness on feet (R26.81);Hemiplegia and hemiparesis;Other symptoms and signs involving cognitive function;Other abnormalities of gait and mobility (R26.89);Cognitive communication deficit (R41.841) Symptoms and signs involving cognitive functions: Cerebral infarction Hemiplegia - Right/Left: Right Hemiplegia - dominant/non-dominant: Dominant Hemiplegia - caused by: Cerebral infarction                Time: 1610-9604 OT Time Calculation (min): 33 min Charges:  OT General Charges $OT Visit: 1 Visit OT Evaluation $OT Eval Moderate Complexity: 1 Mod OT Treatments $Self Care/Home Management : 8-22 mins  Hal Neer OTR/L  Malachi Bonds 09/11/2023, 11:04 AM

## 2023-09-11 NOTE — TOC Initial Note (Signed)
 Transition of Care [facility]) - Initial/Assessment Note    Patient Details  Name: Alexander Donaldson MRN: [id number] Date of Birth: January 11, 1934  Transition of Care [facility]) CM/SW Contact:    Baldemar Lenis, LCSW Phone Number: 09/11/2023, 3:56 PM  Clinical Narrative:      CSW met with patient's spouse, Alexander Donaldson, at bedside to discuss recommendation for SNF. Alexander Donaldson in agreement, hopeful that patient will get stronger and bring him home again; she's been caring for him at home since his initial dementia diagnosis. Preference for [facility], as that is close to home. CSW contacted [facility] to discuss referral, they are able to accept. CSW updated spouse at bedside. CSW requested CMA to initiate insurance authorization request. CSW to follow.             Expected Discharge Plan: Skilled Nursing Facility Barriers to Discharge: Continued Medical Work up, English as a second language teacher   Patient Goals and CMS Choice Patient states their goals for this hospitalization and ongoing recovery are:: patient unable to participate in goal setting, not fully oriented CMS Medicare.gov Compare Post Acute Care list provided to:: Patient Represenative (must comment) Choice offered to / list presented to : Spouse [place] ownership interest in [facility].provided to:: Spouse    Expected Discharge Plan and Services     Post Acute Care Choice: Skilled Nursing Facility Living arrangements for the past 2 months: Single Family Home                                      Prior Living Arrangements/Services Living arrangements for the past 2 months: Single Family Home Lives with:: Spouse Patient language and need for interpreter reviewed:: No Do you feel safe going back to the place where you live?: Yes      Need for Family Participation in Patient Care: Yes (Comment) Care giver support system in place?: Yes (comment) Current home services: DME, Home OT, Home PT Criminal Activity/Legal Involvement  Pertinent to Current Situation/Hospitalization: No - Comment as needed  Activities of Daily Living      Permission Sought/Granted Permission sought to share information with : Facility Medical sales representative, Family Supports Permission granted to share information with : Yes, Verbal Permission Granted  Share Information with NAME: Alexander Donaldson  Permission granted to share info w AGENCY: SNF  Permission granted to share info w Relationship: Spouse     Emotional Assessment Appearance:: Appears stated age Attitude/Demeanor/Rapport: Unable to Assess Affect (typically observed): Unable to Assess Orientation: : Oriented to Self Alcohol / Substance Use: Not Applicable Psych Involvement: No (comment)  Admission diagnosis:  Acute CVA (cerebrovascular accident) [facility]) [I63.9] Patient Active Problem List   Diagnosis Date Noted   Palliative care by specialist 09/11/2023   DNR (do not resuscitate) 09/11/2023   CVA (cerebral vascular accident) (HCC) 09/10/2023   History of CVA 09/04/2023 (cerebrovascular accident) 09/10/2023   History of GI bleed 09/10/2023   Acute CVA (cerebrovascular accident) (HCC) 09/10/2023   Iron deficiency anemia due to chronic blood loss 09/07/2023   Heme positive stool 09/07/2023   TIA (transient ischemic attack) 09/04/2023   Acute upper GI bleed 09/04/2023   Acute blood loss anemia 09/04/2023   Facial droop 09/04/2023   Weakness of right upper extremity 09/04/2023   Acute kidney injury superimposed on stage 3b chronic kidney disease (HCC) 09/04/2023   Paroxysmal atrial fibrillation (HCC) 09/04/2023   Chronic diastolic CHF (congestive heart failure) (HCC)  09/04/2023   Atrial fibrillation with RVR (HCC) 07/02/2023   NSTEMI, initial episode of care [facility]) 07/02/2023   Exposure to COVID-19 virus 03/23/2023   Dysphagia, unspecified 02/01/2023   Goals of care, counseling/discussion 02/01/2023   Arthralgia 07/05/2022   NSTEMI (non-ST elevated myocardial infarction) [facility])     Accelerated junctional rhythm 01/30/2022   Dementia without behavioral disturbance (HCC) 01/30/2022   Carpal tunnel syndrome, bilateral 02/25/2020   Numbness 11/28/2019   Raynaud's phenomenon 10/30/2019   CAD (coronary artery disease) 02/12/2018   Left foot drop 02/12/2018   Non-insulin dependent type 2 diabetes mellitus (HCC) 02/12/2018   Essential hypertension 02/12/2018   Hypokalemia 02/12/2018   PCP:  [facility]:   [facility] [address] [place] [address] Phone: [phone] Fax: [phone]     Social Drivers of Health (SDOH) Social History: SDOH Screenings   Food Insecurity: Patient Unable To Answer (09/11/2023)  Housing: Patient Unable To Answer (09/11/2023)  Transportation Needs: Patient Unable To Answer (09/11/2023)  Utilities: Patient Unable To Answer (09/11/2023)  Alcohol Screen: Low Risk  (07/02/2023)  Depression (PHQ2-9): Low Risk  (07/02/2023)  Financial Resource Strain: Low Risk  (07/02/2023)  Physical Activity: Inactive (07/02/2023)  Social Connections: Unknown (09/11/2023)  Recent Concern: Social Connections - Socially Isolated (07/02/2023)  Stress: Patient Unable To Answer (07/02/2023)  Tobacco Use: Low Risk  (09/10/2023)  Health Literacy: Patient Unable To Answer (07/02/2023)   SDOH Interventions:     Readmission Risk Interventions     No data to display

## 2023-09-11 NOTE — Progress Notes (Signed)
 PROGRESS NOTE    DIONISIO ARAGONES  VHQ:[id number] DOB: 1934-02-08 DOA: 09/10/2023 PCP: [facility]   Brief Narrative:  This 89 yrs old male with PMH significant for dementia, CKD stage IIIb, DM type II , paroxysmal A.fibrillation who has been discharged on 09/09/2023 presented to emergency department by patient's wife with right-sided weakness and facial droop.  Patient was admitted for acute Ischemic CVA on 09/04/23 ,had GI bleed,  status post negative endoscopic evaluation, currently on Protonix 40 mg twice daily, Acute on chronic blood loss anemia status post 1 unit of blood transfusion 2/28, paroxysmal atrial fibrillation currently on amiodarone,  not on anticoagulation due to high risk of GI bleed. Patient was admitted for further evaluation, Neurology is consulted.  CT head shows right paramedian pontine infarct, MRI brain showing same location of CVA without any new location.  Assessment & Plan:   Principal Problem:   Acute CVA (cerebrovascular accident) (HCC) Active Problems:   Acute kidney injury superimposed on stage 3b chronic kidney disease (HCC)   Iron deficiency anemia due to chronic blood loss   CVA (cerebral vascular accident) (HCC)   History of GI bleed   Non-insulin dependent type 2 diabetes mellitus (HCC)   Essential hypertension   Dementia without behavioral disturbance (HCC)   Paroxysmal atrial fibrillation (HCC)   Chronic diastolic CHF (congestive heart failure) (HCC)   History of CVA 09/04/2023 (cerebrovascular accident)   Acute Ischemic CVA : Patient was admitted from 09/04/23 - 09/09/23 for acute CVA.   He was not a good candidate for TNK, Patient has developed GI bleed following antiplatelet therapy. He underwent EGD and currently on Protonix. Given high risk of GI bleeding patient was taken off from Eliquis.  Family wanted to pursue palliative care and home hospice. He presented to  ED with right sided weakness and right-sided facial droop. Patient does  have history of paroxysmal A-fib currently not on Eliquis raises the concern for development of recurrent stroke. CT Head showed right paramedian pontine infraction.   CTA neck showed high-grade calcified stenosis of the right carotid bifurcation.  Dense calcification of the left carotid bifurcation.  Moderate stenosis of the right vertebral origin.  50% stenosis of the left subclavian artery. Neurology consulted, Dr. Roda Shutters recommended admission for stroke workup , MRI ,  PT/ OT ,Speech evaluation. Patient has received aspirin 300 mg per rectum. Allow Permissive hypertension for 24 to 48 hours. Continue Lipitor. Recent echocardiogram shows LVEF 65 to 70%, grade 2 diastolic dysfunction. Neurology follow-up.   Recent GI bleed in the setting of chronic iron deficiency anemia: Patient has recent history of lower GI bleed include black tarry stool during last hospital admission.   Patient underwent EGD which did not show any evidence of bleeding.   GI recommended Protonix 40 mg twice daily.   Due to GI bleed anticoagulation and Nplate therapy has been deferred. H/H remains stable. Continue Protonix 40 mg twice daily.   Paroxysmal atrial fibrillation: EKG showing sinus rhythm heart rate 87. CHA2DS2-VASc score 7.  High risk of embolic CVA in the setting of A-fib. Continue amiodarone 200 mg daily. Deferring Eliquis recent episodes of GI bleed.   AKI on CKD stage IIIb: Serum creatinine found around 2.0,  baseline creatinine around 1.5-1.6.   Renal function improved with IV hydration.  Diastolic heart failure preserved EF 60 to 65% Essential hypertension Holding amlodipine for permissive hypertension for next 24 to 48 hours in the event of acute CVA.   History of dementia:  Continue memantine. Continue delirium precaution.   Non-insulin-dependent DM type II: HbA1c 5.9. Continue healthy carb modified diet continue check POC blood glucose.   Hyperlipidemia: Continue Lipitor   DVT  prophylaxis: SCDs Code Status:DNR Family Communication:  Spouse at bed side Disposition Plan:    Status is: Observation The patient remains OBS appropriate and will d/c before 2 midnights.   Readmitted for recurrence for weakness and right facial droop.   Consultants:  Neurology  Procedures:  CT head, MRI Brain. Antimicrobials:  Anti-infectives (From admission, onward)    None      Subjective: Patient was seen and examined at bedside.  Overnight events noted.   Patient reports feeling better,  Patient has right-sided facial droop.  He denies any other concerns.  Objective: Vitals:   09/10/23 2124 09/11/23 0015 09/11/23 0420 09/11/23 0836  BP: (!) 164/77 (!) 151/71 131/62 (!) 142/59  Pulse: 74 68 61 60  Resp: 16 16 18 18   Temp: 98.3 F (36.8 C) (!) 97.4 F (36.3 C) 97.8 F (36.6 C) 97.7 F (36.5 C)  TempSrc: Oral Oral Axillary Axillary  SpO2: 100% 100% 100% 100%  Weight:        Intake/Output Summary (Last 24 hours) at 09/11/2023 1259 Last data filed at 09/11/2023 0910 Gross per 24 hour  Intake 868.42 ml  Output 400 ml  Net 468.42 ml   Filed Weights   09/10/23 1818  Weight: 70.8 kg    Examination:  General exam: Appears calm and comfortable, not in any acute distress Respiratory system: Clear to auscultation. Respiratory effort normal.  RR 16 Cardiovascular system: S1 & S2 heard, RRR. No JVD, murmurs, rubs, gallops or clicks.  Gastrointestinal system: Abdomen is nondistended, soft and nontender.  Normal bowel sounds heard. Central nervous system: Alert and oriented X 3. Right sided  Facial droop, Extremities: No edema, No cyanosis, No clubbing Skin: No rashes, lesions or ulcers Psychiatry: Judgement and insight appear normal. Mood & affect appropriate.     Data Reviewed: I have personally reviewed following labs and imaging studies  CBC: Recent Labs  Lab 09/06/23 0625 09/07/23 0712 09/08/23 0958 09/09/23 0652 09/10/23 1915 09/10/23 1926  09/11/23 0655  WBC 6.4 6.6 5.8 5.3 4.5  --  5.5  NEUTROABS 3.6 3.6 3.5 3.0 2.9  --   --   HGB 9.3* 9.6* 8.9* 8.7* 9.0* 9.2* 9.3*  HCT 27.1* 28.5* 26.4* 25.7* 27.6* 27.0* 27.4*  MCV 90.6 92.2 92.0 92.1 95.2  --  92.6  PLT 200 219 226 221 232  --  228   Basic Metabolic Panel: Recent Labs  Lab 09/06/23 0625 09/07/23 0712 09/08/23 0958 09/09/23 0652 09/10/23 1915 09/10/23 1926 09/11/23 0655  NA 137 136 139 140 137 139 138  K 3.8 4.3 3.9 3.7 3.7 3.7 3.5  CL 105 107 108 106 103 105 106  CO2 26 22 23 25 22   --  25  GLUCOSE 142* 124* 128* 118* 178* 170* 117*  BUN 22 23 30* 29* 27* 29* 17  CREATININE 1.55* 1.68* 1.97* 1.67* 1.72* 2.00* 1.20  CALCIUM 8.5* 8.6* 8.7* 8.6* 8.4*  --  8.5*  MG 2.1  --   --   --   --   --   --   PHOS 3.5  --   --   --   --   --   --    GFR: Estimated Creatinine Clearance: 36.3 mL/min (by C-G formula based on SCr of 1.2 mg/dL). Liver Function Tests: Recent Labs  Lab 09/06/23 0625 09/10/23 1915  AST 17 20  ALT 14 13  ALKPHOS 51 50  BILITOT 1.0 0.7  PROT 6.1* 6.0*  ALBUMIN 3.2* 3.3*   No results for input(s): "LIPASE", "AMYLASE" in the last 168 hours. No results for input(s): "AMMONIA" in the last 168 hours. Coagulation Profile: Recent Labs  Lab 09/10/23 1915  INR 1.2   Cardiac Enzymes: No results for input(s): "CKTOTAL", "CKMB", "CKMBINDEX", "TROPONINI" in the last 168 hours. BNP (last 3 results) No results for input(s): "PROBNP" in the last 8760 hours. HbA1C: No results for input(s): "HGBA1C" in the last 72 hours. CBG: Recent Labs  Lab 09/08/23 0845 09/10/23 1812 09/11/23 0045 09/11/23 0606 09/11/23 1136  GLUCAP 115* 169* 157* 114* 137*   Lipid Profile: Recent Labs    09/11/23 0655  CHOL 130  HDL 50  LDLCALC 70  TRIG 52  CHOLHDL 2.6   Thyroid Function Tests: No results for input(s): "TSH", "T4TOTAL", "FREET4", "T3FREE", "THYROIDAB" in the last 72 hours. Anemia Panel: No results for input(s): "VITAMINB12", "FOLATE",  "FERRITIN", "TIBC", "IRON", "RETICCTPCT" in the last 72 hours. Sepsis Labs: No results for input(s): "PROCALCITON", "LATICACIDVEN" in the last 168 hours.  Recent Results (from the past 240 hours)  Blood culture (routine x 2)     Status: None   Collection Time: 09/03/23  6:57 PM   Specimen: BLOOD LEFT HAND  Result Value Ref Range Status   Specimen Description BLOOD LEFT HAND  Final   Special Requests   Final    BOTTLES DRAWN AEROBIC AND ANAEROBIC Blood Culture results may not be optimal due to an inadequate volume of blood received in culture bottles   Culture   Final    NO GROWTH 5 DAYS Performed at [facility], [address] [place], [place] [address]    Report Status 09/08/2023 FINAL  Final  Blood culture (routine x 2)     Status: None   Collection Time: 09/03/23  7:02 PM   Specimen: BLOOD  Result Value Ref Range Status   Specimen Description BLOOD RIGHT ANTECUBITAL  Final   Special Requests   Final    BOTTLES DRAWN AEROBIC AND ANAEROBIC Blood Culture adequate volume   Culture   Final    NO GROWTH 5 DAYS Performed at [facility], [address] [place], [place] [address]    Report Status 09/08/2023 FINAL  Final   Radiology Studies: CT ANGIO HEAD NECK W WO CM (CODE STROKE) Result Date: 09/10/2023 CLINICAL DATA:  Neuro deficit. Stroke suspected. Subacute pontine infarct. New right-sided weakness and facial droop. EXAM: CT ANGIOGRAPHY HEAD AND NECK WITH AND WITHOUT CONTRAST TECHNIQUE: Multidetector CT imaging of the head and neck was performed using the standard protocol during bolus administration of intravenous contrast. Multiplanar CT image reconstructions and MIPs were obtained to evaluate the vascular anatomy. Carotid stenosis measurements (when applicable) are obtained utilizing NASCET criteria, using the distal internal carotid diameter as the denominator. RADIATION DOSE REDUCTION: This exam was performed according to the departmental dose-optimization program  which includes automated exposure control, adjustment of the mA and/or kV according to patient size and/or use of iterative reconstruction technique. CONTRAST:  75mL OMNIPAQUE IOHEXOL 350 MG/ML SOLN COMPARISON:  CT head without contrast 09/10/2023. MR head without contrast 09/04/2023. MR angio head 09/06/2023. FINDINGS: CTA NECK FINDINGS Aortic arch: Atherosclerotic calcifications are present at the aortic arch and great vessel origins. A 50% stenosis is present in the proximal left subclavian artery. Common origin of the  left common carotid artery and innominate artery is present without a significant stenosis. No aneurysm is present. No aortic dissection is present. Right carotid system: The right common carotid artery is within normal limits. High-grade calcified stenosis is present at the right carotid bifurcation. The cervical right ICA is normal more distally. Left carotid system: The left common carotid artery to scratched at the left common carotid artery demonstrates some mural calcification without focal stenosis. Dense calcifications are present at the left carotid bifurcation the minimal luminal diameter is 2.5 mm. No significant stenosis is present relative to the more distal vessel. Vertebral arteries: The right vertebral artery is slightly dominant. Both vertebral arteries originate from the subclavian arteries. Moderate stenosis is present at the right vertebral artery origin. No other significant stenoses are present in either vertebral artery in the neck. Skeleton: Multilevel degenerative changes are present throughout the cervical spine. Ankylosis present C5-6. Slight degenerative anterolisthesis present at C2-3 and C3-4. Marked endplate sclerotic changes present at C6-7. Osseous foraminal narrowing present bilaterally at C4-5, C5-6 and on the right at C6-7. Other neck: The soft tissues of the neck are otherwise unremarkable. Salivary glands are within normal limits. Thyroid is normal. No  significant adenopathy is present. No focal mucosal or submucosal lesions are present. Upper chest: The lung apices are clear. The thoracic inlet is within normal limits. Review of the MIP images confirms the above findings CTA HEAD FINDINGS Anterior circulation: Minimal calcifications are present within the cavernous internal carotid arteries bilaterally. No significant stenosis is present through the ICA termini. The A1 and M1 segments are normal. The anterior communicating artery is patent. The MCA bifurcations are normal bilaterally. The ACA and MCA branch vessels are normal bilaterally. Posterior circulation: Atherosclerotic calcifications are present at the dural margin of the left vertebral artery. PICA origins are visualized and normal. The vertebrobasilar junction and basilar artery are normal. Both posterior cerebral arteries originate from the scratched at the superior cerebellar arteries are patent bilaterally. Both posterior cerebral arteries originate from basilar tip. Mild narrowing is present in the mid left P2 segment. The PCA branch vessels are otherwise within normal limits. No aneurysm is present. Venous sinuses: The dural sinuses are patent. The straight sinus and deep cerebral veins are intact. Cortical veins are within normal limits. No significant vascular malformation is evident. Anatomic variants: None Review of the MIP images confirms the above findings IMPRESSION: 1. High-grade calcified stenosis of the right carotid bifurcation. 2. Dense calcifications at the left carotid bifurcation without significant stenosis relative to the more distal vessel. 3. Moderate stenosis at the dominant right vertebral artery origin. 4. 50% stenosis of the proximal left subclavian artery. 5. Mild narrowing in the mid left P2 segment. 6. No other significant proximal stenosis, aneurysm, or branch vessel occlusion within the Circle of Willis. 7. Multilevel degenerative changes throughout the cervical spine.  8.  Aortic Atherosclerosis (ICD10-I70.0). Electronically Signed   By: Marin Roberts M.D.   On: 09/10/2023 19:32   CT HEAD CODE STROKE WO CONTRAST Result Date: 09/10/2023 CLINICAL DATA:  Code stroke. Neuro deficit, acute, stroke suspected. Patient discharged yesterday following hospitalization for acute pontine infarct. EXAM: CT HEAD WITHOUT CONTRAST TECHNIQUE: Contiguous axial images were obtained from the base of the skull through the vertex without intravenous contrast. RADIATION DOSE REDUCTION: This exam was performed according to the departmental dose-optimization program which includes automated exposure control, adjustment of the mA and/or kV according to patient size and/or use of iterative reconstruction technique. COMPARISON:  CT head  without contrast 09/03/2023. MR head without contrast 09/04/2023. FINDINGS: Brain: The study is moderately degraded by patient motion. The right paramedian pontine infarct is noted. Moderate generalized atrophy and white matter disease is otherwise stable. The ventricles are proportionate to the degree of atrophy. Chronic encephalomalacia is again noted at the left temporal tip. The brainstem and cerebellum are otherwise within normal limits. Midline structures are within normal limits. Vascular: Atherosclerotic calcifications are present within the cavernous internal carotid arteries and at the dural margin of both vertebral arteries. No hyperdense vessel is present. Skull: Calvarium is intact. No focal lytic or blastic lesions are present. No significant extracranial soft tissue lesion is present. Sinuses/Orbits: The paranasal sinuses and mastoid air cells are clear. The globes and orbits are within normal limits. ASPECTS [facility] Stroke Program Early CT Score) - Ganglionic level infarction (caudate, lentiform nuclei, internal capsule, insula, M1-M3 cortex): 7/7 - Supraganglionic infarction (M4-M6 cortex): 3/3 Total score (0-10 with 10 being normal): 10/10  IMPRESSION: 1. Right paramedian pontine infarct. 2. Stable atrophy and white matter disease. This likely reflects the sequela of chronic microvascular ischemia. 3. Chronic encephalomalacia at the left temporal tip. 4. No acute intracranial abnormality or significant interval change. The above was relayed via text pager to Dr. Iver Nestle on 09/10/2023 at 18:40 . Electronically Signed   By: Marin Roberts M.D.   On: 09/10/2023 18:41    Scheduled Meds:   stroke: early stages of recovery book   Does not apply Once   amiodarone  200 mg Oral Daily   atorvastatin  40 mg Oral Daily   LORazepam  1 mg Intravenous Once   memantine  10 mg Oral BID   multivitamin with minerals  1 tablet Oral Daily   pantoprazole  40 mg Oral BID   sodium chloride flush  3-10 mL Intravenous Q12H   Continuous Infusions:  lactated ringers 75 mL/hr at 09/11/23 1220     LOS: 0 days    Time spent: 50 mins.    Willeen Niece, MD [facility]   If 7PM-7AM, please contact night-coverage

## 2023-09-11 NOTE — Progress Notes (Signed)
 STROKE TEAM PROGRESS NOTE   INTERIM HISTORY/SUBJECTIVE Wife and friend are at the bedside.  Patient was readmitted yesterday for right arm weakness, not TNK candidate, no LVO on CTA but severe stenosis R ICA bulb, asymptomatic. Today neuro stable still has right arm weakness, not able to tolerate MRI will repeat CT.  OBJECTIVE  CBC    Component Value Date/Time   WBC 5.5 09/11/2023 0655   RBC 2.96 (L) 09/11/2023 0655   HGB 9.3 (L) 09/11/2023 0655   HCT 27.4 (L) 09/11/2023 0655   PLT 228 09/11/2023 0655   MCV 92.6 09/11/2023 0655   MCH 31.4 09/11/2023 0655   MCHC 33.9 09/11/2023 0655   RDW 13.8 09/11/2023 0655   LYMPHSABS 1.1 09/10/2023 1915   MONOABS 0.4 09/10/2023 1915   EOSABS 0.2 09/10/2023 1915   BASOSABS 0.0 09/10/2023 1915    BMET    Component Value Date/Time   NA 138 09/11/2023 0655   NA 139 12/30/2020 0000   K 3.5 09/11/2023 0655   CL 106 09/11/2023 0655   CO2 25 09/11/2023 0655   GLUCOSE 117 (H) 09/11/2023 0655   BUN 17 09/11/2023 0655   BUN 19 12/30/2020 0000   CREATININE 1.20 09/11/2023 0655   CALCIUM 8.5 (L) 09/11/2023 0655   GFRNONAA 59 (L) 09/11/2023 0655    IMAGING past 24 hours CT ANGIO HEAD NECK W WO CM (CODE STROKE) Result Date: 09/10/2023 CLINICAL DATA:  Neuro deficit. Stroke suspected. Subacute pontine infarct. New right-sided weakness and facial droop. EXAM: CT ANGIOGRAPHY HEAD AND NECK WITH AND WITHOUT CONTRAST TECHNIQUE: Multidetector CT imaging of the head and neck was performed using the standard protocol during bolus administration of intravenous contrast. Multiplanar CT image reconstructions and MIPs were obtained to evaluate the vascular anatomy. Carotid stenosis measurements (when applicable) are obtained utilizing NASCET criteria, using the distal internal carotid diameter as the denominator. RADIATION DOSE REDUCTION: This exam was performed according to the departmental dose-optimization program which includes automated exposure control,  adjustment of the mA and/or kV according to patient size and/or use of iterative reconstruction technique. CONTRAST:  75mL OMNIPAQUE IOHEXOL 350 MG/ML SOLN COMPARISON:  CT head without contrast 09/10/2023. MR head without contrast 09/04/2023. MR angio head 09/06/2023. FINDINGS: CTA NECK FINDINGS Aortic arch: Atherosclerotic calcifications are present at the aortic arch and great vessel origins. A 50% stenosis is present in the proximal left subclavian artery. Common origin of the left common carotid artery and innominate artery is present without a significant stenosis. No aneurysm is present. No aortic dissection is present. Right carotid system: The right common carotid artery is within normal limits. High-grade calcified stenosis is present at the right carotid bifurcation. The cervical right ICA is normal more distally. Left carotid system: The left common carotid artery to scratched at the left common carotid artery demonstrates some mural calcification without focal stenosis. Dense calcifications are present at the left carotid bifurcation the minimal luminal diameter is 2.5 mm. No significant stenosis is present relative to the more distal vessel. Vertebral arteries: The right vertebral artery is slightly dominant. Both vertebral arteries originate from the subclavian arteries. Moderate stenosis is present at the right vertebral artery origin. No other significant stenoses are present in either vertebral artery in the neck. Skeleton: Multilevel degenerative changes are present throughout the cervical spine. Ankylosis present C5-6. Slight degenerative anterolisthesis present at C2-3 and C3-4. Marked endplate sclerotic changes present at C6-7. Osseous foraminal narrowing present bilaterally at C4-5, C5-6 and on the right at C6-7. Other neck:  The soft tissues of the neck are otherwise unremarkable. Salivary glands are within normal limits. Thyroid is normal. No significant adenopathy is present. No focal mucosal  or submucosal lesions are present. Upper chest: The lung apices are clear. The thoracic inlet is within normal limits. Review of the MIP images confirms the above findings CTA HEAD FINDINGS Anterior circulation: Minimal calcifications are present within the cavernous internal carotid arteries bilaterally. No significant stenosis is present through the ICA termini. The A1 and M1 segments are normal. The anterior communicating artery is patent. The MCA bifurcations are normal bilaterally. The ACA and MCA branch vessels are normal bilaterally. Posterior circulation: Atherosclerotic calcifications are present at the dural margin of the left vertebral artery. PICA origins are visualized and normal. The vertebrobasilar junction and basilar artery are normal. Both posterior cerebral arteries originate from the scratched at the superior cerebellar arteries are patent bilaterally. Both posterior cerebral arteries originate from basilar tip. Mild narrowing is present in the mid left P2 segment. The PCA branch vessels are otherwise within normal limits. No aneurysm is present. Venous sinuses: The dural sinuses are patent. The straight sinus and deep cerebral veins are intact. Cortical veins are within normal limits. No significant vascular malformation is evident. Anatomic variants: None Review of the MIP images confirms the above findings IMPRESSION: 1. High-grade calcified stenosis of the right carotid bifurcation. 2. Dense calcifications at the left carotid bifurcation without significant stenosis relative to the more distal vessel. 3. Moderate stenosis at the dominant right vertebral artery origin. 4. 50% stenosis of the proximal left subclavian artery. 5. Mild narrowing in the mid left P2 segment. 6. No other significant proximal stenosis, aneurysm, or branch vessel occlusion within the Circle of Willis. 7. Multilevel degenerative changes throughout the cervical spine. 8.  Aortic Atherosclerosis (ICD10-I70.0).  Electronically Signed   By: Marin Roberts M.D.   On: 09/10/2023 19:32   CT HEAD CODE STROKE WO CONTRAST Result Date: 09/10/2023 CLINICAL DATA:  Code stroke. Neuro deficit, acute, stroke suspected. Patient discharged yesterday following hospitalization for acute pontine infarct. EXAM: CT HEAD WITHOUT CONTRAST TECHNIQUE: Contiguous axial images were obtained from the base of the skull through the vertex without intravenous contrast. RADIATION DOSE REDUCTION: This exam was performed according to the departmental dose-optimization program which includes automated exposure control, adjustment of the mA and/or kV according to patient size and/or use of iterative reconstruction technique. COMPARISON:  CT head without contrast 09/03/2023. MR head without contrast 09/04/2023. FINDINGS: Brain: The study is moderately degraded by patient motion. The right paramedian pontine infarct is noted. Moderate generalized atrophy and white matter disease is otherwise stable. The ventricles are proportionate to the degree of atrophy. Chronic encephalomalacia is again noted at the left temporal tip. The brainstem and cerebellum are otherwise within normal limits. Midline structures are within normal limits. Vascular: Atherosclerotic calcifications are present within the cavernous internal carotid arteries and at the dural margin of both vertebral arteries. No hyperdense vessel is present. Skull: Calvarium is intact. No focal lytic or blastic lesions are present. No significant extracranial soft tissue lesion is present. Sinuses/Orbits: The paranasal sinuses and mastoid air cells are clear. The globes and orbits are within normal limits. ASPECTS [facility] Stroke Program Early CT Score) - Ganglionic level infarction (caudate, lentiform nuclei, internal capsule, insula, M1-M3 cortex): 7/7 - Supraganglionic infarction (M4-M6 cortex): 3/3 Total score (0-10 with 10 being normal): 10/10 IMPRESSION: 1. Right paramedian pontine infarct. 2.  Stable atrophy and white matter disease. This likely reflects the sequela of  chronic microvascular ischemia. 3. Chronic encephalomalacia at the left temporal tip. 4. No acute intracranial abnormality or significant interval change. The above was relayed via text pager to Dr. Iver Nestle on 09/10/2023 at 18:40 . Electronically Signed   By: Marin Roberts M.D.   On: 09/10/2023 18:41     Vitals:   09/11/23 0015 09/11/23 0420 09/11/23 0836 09/11/23 1554  BP: (!) 151/71 131/62 (!) 142/59 (!) 171/72  Pulse: 68 61 60 64  Resp: 16 18 18 20   Temp: (!) 97.4 F (36.3 C) 97.8 F (36.6 C) 97.7 F (36.5 C) 98.3 F (36.8 C)  TempSrc: Oral Axillary Axillary Oral  SpO2: 100% 100% 100% 100%  Weight:         PHYSICAL EXAM General:  Alert, well-nourished, well-developed patient in no acute distress Psych:  Mood and affect appropriate for situation CV: Regular rate and rhythm on monitor Respiratory:  Regular, unlabored respirations on room air GI: Abdomen soft and nontender   NEURO:  Pt lying in bed, awake, alert, eyes open, hard of hearing, orientated to self only, not to time place or age. Seems to have fluent speech output but only occasionally make sense, largely word salad and not sensible. No gaze palsy, tracking bilaterally, blinking to visual threat bilaterally. Mild right facial droop. Tongue midline. LUE 4/5 and RUE 3-/5 proximal and 2-/5 hand grip. BLEs 3/5 proximal. Sensation and coordination not cooperative, gait not tested.   ASSESSMENT/PLAN  Alexander Donaldson is a 89 y.o. male with history of  atrial fibrillation on Eliquis, diabetes, hypertension, hyperlipidemia, heart failure, CKD, CAD, Alzheimer's who presents with increasing difficulty walking and some right arm dysfunction he was also noted to have a GI bleed NIH on Admission 7  Stroke -likely left MCA stroke, due to A-fib not on [facility] CT head No acute abnormality. Small vessel disease. Atrophy.    MRI not able to tolerate at this  time Now repeat CT pending CT head and neck high-grade stenosis right carotid bulb.  Left ICA dense calcification without significant stenosis.  Moderate stenosis right VA origin, 50% stenosis left subclavian artery. VTE prophylaxis -SCDs No antithrombotics prior to admission, plan to restart Eliquis once CT repeat showed no bleeding Therapy recommendations: SNF Disposition: Pending  History of stroke 08/2023 admitted for right pontine infarct.  CT no acute abnormality.  MRI shows 16 mm acute right pontine infarct.  MRA, carotid Doppler unremarkable.  EF 60 to 65%.  LDL 82, A1c 5.9.  Also had upper GI bleeding, EGD negative.  Wife decided no antithrombotics on discharge and wanted more palliative approach.  Atrial fibrillation Home Meds: Eliquis, amiodarone 200 mg On amiodarone Plan to restart Eliquis once CT repeat showed no bleeding  Hypertension Home meds: Amlodipine 2.5 mg, losartan 100 mg Stable now Long-term BP goal normotensive  Hyperlipidemia Home meds: Atorvastatin 20 mg LDL 82, goal < 70 Recently increased atorvastatin to 40 mg Continue statin at discharge  Dementia Continue home Namenda  Recent upper GI bleed Had EGD last admission negative so far Status post iron IV CBC monitoring  Other Stroke Risk Factors Advanced age Coronary artery disease  Other Active Problems AKI, creatinine 1.27--1.55 - 1.68--1.97--1.72--2.00--1.20   Hospital day # 0    Marvel Plan, MD PhD Stroke Neurology 09/11/2023 4:49 PM    To contact Stroke Continuity provider, please refer to [email or web address]. After hours, contact General Neurology

## 2023-09-12 DIAGNOSIS — Z66 Do not resuscitate: Secondary | ICD-10-CM | POA: Diagnosis present

## 2023-09-12 DIAGNOSIS — Z7901 Long term (current) use of anticoagulants: Secondary | ICD-10-CM | POA: Diagnosis not present

## 2023-09-12 DIAGNOSIS — E8809 Other disorders of plasma-protein metabolism, not elsewhere classified: Secondary | ICD-10-CM | POA: Diagnosis present

## 2023-09-12 DIAGNOSIS — I130 Hypertensive heart and chronic kidney disease with heart failure and stage 1 through stage 4 chronic kidney disease, or unspecified chronic kidney disease: Secondary | ICD-10-CM | POA: Diagnosis present

## 2023-09-12 DIAGNOSIS — M6259 Muscle wasting and atrophy, not elsewhere classified, multiple sites: Secondary | ICD-10-CM | POA: Diagnosis not present

## 2023-09-12 DIAGNOSIS — Z8719 Personal history of other diseases of the digestive system: Secondary | ICD-10-CM | POA: Diagnosis not present

## 2023-09-12 DIAGNOSIS — I6521 Occlusion and stenosis of right carotid artery: Secondary | ICD-10-CM | POA: Diagnosis not present

## 2023-09-12 DIAGNOSIS — G309 Alzheimer's disease, unspecified: Secondary | ICD-10-CM | POA: Diagnosis not present

## 2023-09-12 DIAGNOSIS — M6281 Muscle weakness (generalized): Secondary | ICD-10-CM | POA: Diagnosis not present

## 2023-09-12 DIAGNOSIS — Z515 Encounter for palliative care: Secondary | ICD-10-CM | POA: Diagnosis not present

## 2023-09-12 DIAGNOSIS — Z7401 Bed confinement status: Secondary | ICD-10-CM | POA: Diagnosis not present

## 2023-09-12 DIAGNOSIS — G459 Transient cerebral ischemic attack, unspecified: Secondary | ICD-10-CM | POA: Diagnosis not present

## 2023-09-12 DIAGNOSIS — Z955 Presence of coronary angioplasty implant and graft: Secondary | ICD-10-CM | POA: Diagnosis not present

## 2023-09-12 DIAGNOSIS — E1122 Type 2 diabetes mellitus with diabetic chronic kidney disease: Secondary | ICD-10-CM | POA: Diagnosis present

## 2023-09-12 DIAGNOSIS — R278 Other lack of coordination: Secondary | ICD-10-CM | POA: Diagnosis not present

## 2023-09-12 DIAGNOSIS — G8191 Hemiplegia, unspecified affecting right dominant side: Secondary | ICD-10-CM | POA: Diagnosis present

## 2023-09-12 DIAGNOSIS — E785 Hyperlipidemia, unspecified: Secondary | ICD-10-CM

## 2023-09-12 DIAGNOSIS — E876 Hypokalemia: Secondary | ICD-10-CM | POA: Diagnosis present

## 2023-09-12 DIAGNOSIS — I5042 Chronic combined systolic (congestive) and diastolic (congestive) heart failure: Secondary | ICD-10-CM | POA: Diagnosis present

## 2023-09-12 DIAGNOSIS — I63219 Cerebral infarction due to unspecified occlusion or stenosis of unspecified vertebral arteries: Secondary | ICD-10-CM | POA: Diagnosis not present

## 2023-09-12 DIAGNOSIS — D509 Iron deficiency anemia, unspecified: Secondary | ICD-10-CM | POA: Diagnosis present

## 2023-09-12 DIAGNOSIS — I6980 Unspecified sequelae of other cerebrovascular disease: Secondary | ICD-10-CM | POA: Diagnosis not present

## 2023-09-12 DIAGNOSIS — I2510 Atherosclerotic heart disease of native coronary artery without angina pectoris: Secondary | ICD-10-CM | POA: Diagnosis present

## 2023-09-12 DIAGNOSIS — Z79899 Other long term (current) drug therapy: Secondary | ICD-10-CM | POA: Diagnosis not present

## 2023-09-12 DIAGNOSIS — I639 Cerebral infarction, unspecified: Secondary | ICD-10-CM | POA: Diagnosis not present

## 2023-09-12 DIAGNOSIS — I708 Atherosclerosis of other arteries: Secondary | ICD-10-CM | POA: Diagnosis present

## 2023-09-12 DIAGNOSIS — Z7984 Long term (current) use of oral hypoglycemic drugs: Secondary | ICD-10-CM | POA: Diagnosis not present

## 2023-09-12 DIAGNOSIS — R2681 Unsteadiness on feet: Secondary | ICD-10-CM | POA: Diagnosis not present

## 2023-09-12 DIAGNOSIS — N1832 Chronic kidney disease, stage 3b: Secondary | ICD-10-CM | POA: Diagnosis not present

## 2023-09-12 DIAGNOSIS — I63512 Cerebral infarction due to unspecified occlusion or stenosis of left middle cerebral artery: Principal | ICD-10-CM

## 2023-09-12 DIAGNOSIS — E872 Acidosis, unspecified: Secondary | ICD-10-CM | POA: Diagnosis present

## 2023-09-12 DIAGNOSIS — E119 Type 2 diabetes mellitus without complications: Secondary | ICD-10-CM | POA: Diagnosis not present

## 2023-09-12 DIAGNOSIS — F0280 Dementia in other diseases classified elsewhere without behavioral disturbance: Secondary | ICD-10-CM | POA: Diagnosis present

## 2023-09-12 DIAGNOSIS — N179 Acute kidney failure, unspecified: Secondary | ICD-10-CM | POA: Diagnosis not present

## 2023-09-12 DIAGNOSIS — I10 Essential (primary) hypertension: Secondary | ICD-10-CM | POA: Diagnosis not present

## 2023-09-12 DIAGNOSIS — I5032 Chronic diastolic (congestive) heart failure: Secondary | ICD-10-CM | POA: Diagnosis not present

## 2023-09-12 DIAGNOSIS — F0390 Unspecified dementia without behavioral disturbance: Secondary | ICD-10-CM | POA: Diagnosis not present

## 2023-09-12 DIAGNOSIS — I480 Paroxysmal atrial fibrillation: Secondary | ICD-10-CM | POA: Diagnosis not present

## 2023-09-12 LAB — GLUCOSE, CAPILLARY
Glucose-Capillary: 115 mg/dL — ABNORMAL HIGH (ref 70–99)
Glucose-Capillary: 142 mg/dL — ABNORMAL HIGH (ref 70–99)
Glucose-Capillary: 137 mg/dL — ABNORMAL HIGH (ref 70–99)
Glucose-Capillary: 172 mg/dL — ABNORMAL HIGH (ref 70–99)

## 2023-09-12 MED ORDER — APIXABAN 5 MG PO TABS
5.0000 mg | ORAL_TABLET | Freq: Two times a day (BID) | ORAL | Status: DC
  Administered 2023-09-12 – 2023-09-13 (×2): 5 mg via ORAL
  Filled 2023-09-12 (×2): qty 1

## 2023-09-12 MED ORDER — PANTOPRAZOLE SODIUM 40 MG IV SOLR
40.0000 mg | Freq: Two times a day (BID) | INTRAVENOUS | Status: DC
  Administered 2023-09-12 – 2023-09-13 (×3): 40 mg via INTRAVENOUS
  Filled 2023-09-12 (×3): qty 10

## 2023-09-12 NOTE — Progress Notes (Signed)
 Patient ID: ECHO ALLSBROOK, male   DOB: 1933/07/15, 89 y.o.   MRN: [id number]    Progress Note from the Palliative Medicine Team at [facility]   Patient Name: Alexander Donaldson        Date: 09/12/2023 DOB: August 10, 1933  Age: 89 y.o. MRN#: [id number] Attending Physician: Willeen Niece, MD Primary Care Physician: [facility] Admit Date: 09/10/2023   Reason for Consultation/Follow-up   Establishing Goals of Care   HPI/ Brief Hospital Review 89 y.o. male  with past medical history of dementia, atrial fibrillation on Eliquis, CAD with prior MI, diabetes type 2, spinal stenosis and sciatica, HTN, and HLD. He arrived via EMS from home 09/10/2023 as a code stroke with complaint of right-sided weakness and facial droop. Mr. Prashad had previous admission at [facility] 09/03/2023- 09/09/2023 with acute right pontine CVA, acute blood loss anemia and suspected upper GI bleed.     Palliative care consulted for present admission to discuss further goals of care in the setting of acute CVA and hx of GIB, not a candidate for anticoagulation due to high risk for bleeding.    Patient does not have decision-making capacity, wife faces treatment option decisions, advanced directive decisions and anticipatory care needs.  Subjective  Extensive chart review has been completed prior to meeting with patient/family  including labs, vital signs, imaging, progress/consult notes, orders, medications and available advance directive documents.    This NP assessed patient at the bedside this morning  as a follow up to  yesterday's GOCs meeting, assessing for palliative medicine needs and emotional support. Wife and daughter at bedside  Patient currently resting comfortably, I did not stimulate him.  Wife tells me he just got a bath and situated.   Plan is for discharge to SNF today for short-term rehab  Education offered to wife today regarding  the importance of continued conversation with her  family and the   medical providers regarding overall plan of care and treatment options,  ensuring decisions are within the context of the patients values and GOCs.  Education offered on patient's  high risk for decompensation secondary to multiple comorbidities  Questions and concerns addressed   Discussed with primary team and nursing staff   Time: 25  minutes  Detailed review of medical records ( labs, imaging, vital signs), medically appropriate exam ( MS, skin, cardiac,  resp)   discussed with treatment team, counseling and education to patient, family, staff, documenting clinical information, medication management, coordination of care    Lorinda Creed NP  Palliative Medicine Team Team Phone # [phone] Pager [phone]

## 2023-09-12 NOTE — TOC Progression Note (Signed)
 Transition of Care [facility]) - Progression Note    Patient Details  Name: Alexander Donaldson MRN: [id number] Date of Birth: May 05, 1934  Transition of [facility] CM/SW Contact  Baldemar Lenis, [place] Phone Number: 09/12/2023, 3:09 PM  Clinical Narrative:   Patient has received insurance approval for [facility], but not medically stable today. CSW met with spouse at bedside to update. CSW to follow.    Expected Discharge Plan: Skilled Nursing Facility Barriers to Discharge: Continued Medical Work up  Expected Discharge Plan and Services     Post Acute Care Choice: Skilled Nursing Facility Living arrangements for the past 2 months: Single Family Home                                       Social Determinants of Health (SDOH) Interventions SDOH Screenings   Food Insecurity: No Food Insecurity (09/11/2023)  Housing: Low Risk  (09/11/2023)  Transportation Needs: No Transportation Needs (09/11/2023)  Utilities: Patient Unable To Answer (09/11/2023)  Alcohol Screen: Low Risk  (07/02/2023)  Depression (PHQ2-9): Low Risk  (07/02/2023)  Financial Resource Strain: Low Risk  (07/02/2023)  Physical Activity: Inactive (07/02/2023)  Social Connections: Unknown (09/11/2023)  Recent Concern: Social Connections - Socially Isolated (07/02/2023)  Stress: Patient Unable To Answer (07/02/2023)  Tobacco Use: Low Risk  (09/10/2023)  Health Literacy: Patient Unable To Answer (07/02/2023)    Readmission Risk Interventions     No data to display

## 2023-09-12 NOTE — Progress Notes (Signed)
 PROGRESS NOTE    QUINTEL MCCALLA  BJY:[id number] DOB: 03-Jan-1934 DOA: 09/10/2023 PCP: [facility]   Brief Narrative:  This 89 yrs old male with PMH significant for dementia, CKD stage IIIb, DM type II , paroxysmal A.fibrillation who has been discharged on 09/09/2023 presented to emergency department by patient's wife with right-sided weakness and facial droop.  Patient was admitted for acute Ischemic CVA on 09/04/23 , had GI bleed,  status post negative endoscopic evaluation, currently on Protonix 40 mg twice daily, Acute on chronic blood loss anemia status post 1 unit of blood transfusion 2/28, paroxysmal atrial fibrillation currently on amiodarone,  not on anticoagulation due to high risk of GI bleed. Patient was admitted for further evaluation, Neurology is consulted.  CT head shows right paramedian pontine infarct, MRI brain showing same location of CVA without any new location.  Assessment & Plan:   Principal Problem:   Acute CVA (cerebrovascular accident) (HCC) Active Problems:   Acute kidney injury superimposed on stage 3b chronic kidney disease (HCC)   Iron deficiency anemia due to chronic blood loss   CVA (cerebral vascular accident) (HCC)   History of GI bleed   Non-insulin dependent type 2 diabetes mellitus (HCC)   Essential hypertension   Dementia without behavioral disturbance (HCC)   Goals of care, counseling/discussion   Paroxysmal atrial fibrillation (HCC)   Chronic diastolic CHF (congestive heart failure) (HCC)   History of CVA 09/04/2023 (cerebrovascular accident)   Palliative care by specialist   DNR (do not resuscitate)   Acute Ischemic CVA : Patient was admitted from 09/04/23 - 09/09/23 for acute CVA.   He was not a good candidate for TNK, Patient has developed GI bleed following antiplatelet therapy. He underwent EGD and currently on Protonix. Given high risk of GI bleeding patient was taken off from Eliquis.  Family wanted to pursue palliative care and home  hospice. He presented this time to ED with right sided weakness and right-sided facial droop. Patient does have history of paroxysmal A-fib currently not on Eliquis raises the concern for development of recurrent stroke. CT Head showed right paramedian pontine infraction.   CTA neck showed high-grade calcified stenosis of the right carotid bifurcation.  Dense calcification of the left carotid bifurcation.  Moderate stenosis of the right vertebral origin.  50% stenosis of the left subclavian artery. Neurology consulted, Dr. Roda Shutters recommended admission for stroke workup , MRI ,  PT/ OT ,Speech evaluation. Patient has received aspirin 300 mg per rectum. Allow Permissive hypertension for 24 to 48 hours. Continue Lipitor. Recent echocardiogram shows LVEF 65 to 70%, grade 2 diastolic dysfunction. Neurology follow-up.  Patient could not tolerate MRI. Plan is to repeat CT head. Resume Eliquis if no bleeding on repeat CT head.   Recent GI bleed in the setting of chronic iron deficiency anemia: Patient has recent history of lower GI bleed include black tarry stool during last hospital admission.   Patient underwent EGD which did not show any evidence of bleeding.   GI recommended Protonix 40 mg twice daily.   Due to GI bleed anticoagulation and Nplate therapy has been deferred. H/H remains stable. Continue Protonix 40 mg twice daily.   Paroxysmal atrial fibrillation: EKG showing sinus rhythm heart rate 87. CHA2DS2-VASc score 7.  High risk of embolic CVA in the setting of A-fib. Continue amiodarone 200 mg daily. Deferring Eliquis recent episodes of GI bleed. Follow up Neuro recommendation.   AKI on CKD stage IIIb: Serum creatinine found around 2.0,  baseline creatinine around 1.5-1.6.   Renal function improved with IV hydration.  Diastolic heart failure preserved EF 60 to 65% Essential hypertension Holding amlodipine for permissive hypertension for next 24 to 48 hours in the event of acute CVA.    History of dementia: Continue memantine. Continue delirium precaution.   Non-insulin-dependent DM type II: HbA1c 5.9. Continue healthy carb modified diet continue check POC blood glucose.   Hyperlipidemia: Continue Lipitor   DVT prophylaxis: SCDs Code Status:DNR Family Communication:  Spouse at bed side Disposition Plan:    Status is: Observation The patient remains OBS appropriate and will d/c before 2 midnights.     Readmitted for recurrence for weakness and right facial droop.   Consultants:  Neurology  Procedures:  CT head, MRI Brain. Antimicrobials:  Anti-infectives (From admission, onward)    None      Subjective: Patient was seen and examined at bedside. Overnight events noted.   Patient reports feeling much improved.  Patient has right-sided facial droop, denies any other concerns.  Objective: Vitals:   09/12/23 0006 09/12/23 0348 09/12/23 0733 09/12/23 1107  BP: 134/65 (!) 155/60 134/65 (!) 147/62  Pulse: 63 (!) 56 60 (!) 56  Resp: 16 16 17 17   Temp: 98.3 F (36.8 C) 98.3 F (36.8 C) 98.2 F (36.8 C) 98.3 F (36.8 C)  TempSrc: Oral Oral Oral Oral  SpO2: 98% 100% 100% 97%  Weight:      Height:        Intake/Output Summary (Last 24 hours) at 09/12/2023 1413 Last data filed at 09/11/2023 2232 Gross per 24 hour  Intake 1110.97 ml  Output 1350 ml  Net -239.03 ml   Filed Weights   09/10/23 1818 09/11/23 1826  Weight: 70.8 kg 67.2 kg    Examination:  General exam: Appears calm and comfortable, not in any acute distress Respiratory system: CTA Bilaterally . Respiratory effort normal.  RR 14 Cardiovascular system: S1 & S2 heard, RRR. No JVD, murmurs, rubs, gallops or clicks.  Gastrointestinal system: Abdomen is nondistended, soft and nontender.  Normal bowel sounds heard. Central nervous system: Alert and oriented X 3. Right sided  Facial droop, Extremities: No edema, No cyanosis, No clubbing Skin: No rashes, lesions or ulcers Psychiatry:  Judgement and insight appear normal. Mood & affect appropriate.     Data Reviewed: I have personally reviewed following labs and imaging studies  CBC: Recent Labs  Lab 09/06/23 0625 09/07/23 0712 09/08/23 0958 09/09/23 0652 09/10/23 1915 09/10/23 1926 09/11/23 0655  WBC 6.4 6.6 5.8 5.3 4.5  --  5.5  NEUTROABS 3.6 3.6 3.5 3.0 2.9  --   --   HGB 9.3* 9.6* 8.9* 8.7* 9.0* 9.2* 9.3*  HCT 27.1* 28.5* 26.4* 25.7* 27.6* 27.0* 27.4*  MCV 90.6 92.2 92.0 92.1 95.2  --  92.6  PLT 200 219 226 221 232  --  228   Basic Metabolic Panel: Recent Labs  Lab 09/06/23 0625 09/07/23 0712 09/08/23 0958 09/09/23 0652 09/10/23 1915 09/10/23 1926 09/11/23 0655  NA 137 136 139 140 137 139 138  K 3.8 4.3 3.9 3.7 3.7 3.7 3.5  CL 105 107 108 106 103 105 106  CO2 26 22 23 25 22   --  25  GLUCOSE 142* 124* 128* 118* 178* 170* 117*  BUN 22 23 30* 29* 27* 29* 17  CREATININE 1.55* 1.68* 1.97* 1.67* 1.72* 2.00* 1.20  CALCIUM 8.5* 8.6* 8.7* 8.6* 8.4*  --  8.5*  MG 2.1  --   --   --   --   --   --  PHOS 3.5  --   --   --   --   --   --    GFR: Estimated Creatinine Clearance: 36.3 mL/min (by C-G formula based on SCr of 1.2 mg/dL). Liver Function Tests: Recent Labs  Lab 09/06/23 0625 09/10/23 1915  AST 17 20  ALT 14 13  ALKPHOS 51 50  BILITOT 1.0 0.7  PROT 6.1* 6.0*  ALBUMIN 3.2* 3.3*   No results for input(s): "LIPASE", "AMYLASE" in the last 168 hours. No results for input(s): "AMMONIA" in the last 168 hours. Coagulation Profile: Recent Labs  Lab 09/10/23 1915  INR 1.2   Cardiac Enzymes: No results for input(s): "CKTOTAL", "CKMB", "CKMBINDEX", "TROPONINI" in the last 168 hours. BNP (last 3 results) No results for input(s): "PROBNP" in the last 8760 hours. HbA1C: No results for input(s): "HGBA1C" in the last 72 hours. CBG: Recent Labs  Lab 09/11/23 1136 09/11/23 1650 09/11/23 2125 09/12/23 0620 09/12/23 1109  GLUCAP 137* 121* 140* 115* 142*   Lipid Profile: Recent Labs     09/11/23 0655  CHOL 130  HDL 50  LDLCALC 70  TRIG 52  CHOLHDL 2.6   Thyroid Function Tests: No results for input(s): "TSH", "T4TOTAL", "FREET4", "T3FREE", "THYROIDAB" in the last 72 hours. Anemia Panel: No results for input(s): "VITAMINB12", "FOLATE", "FERRITIN", "TIBC", "IRON", "RETICCTPCT" in the last 72 hours. Sepsis Labs: No results for input(s): "PROCALCITON", "LATICACIDVEN" in the last 168 hours.  Recent Results (from the past 240 hours)  Blood culture (routine x 2)     Status: None   Collection Time: 09/03/23  6:57 PM   Specimen: BLOOD LEFT HAND  Result Value Ref Range Status   Specimen Description BLOOD LEFT HAND  Final   Special Requests   Final    BOTTLES DRAWN AEROBIC AND ANAEROBIC Blood Culture results may not be optimal due to an inadequate volume of blood received in culture bottles   Culture   Final    NO GROWTH 5 DAYS Performed at [facility], [address], [place], [place] [address]    Report Status 09/08/2023 FINAL  Final  Blood culture (routine x 2)     Status: None   Collection Time: 09/03/23  7:02 PM   Specimen: BLOOD  Result Value Ref Range Status   Specimen Description BLOOD RIGHT ANTECUBITAL  Final   Special Requests   Final    BOTTLES DRAWN AEROBIC AND ANAEROBIC Blood Culture adequate volume   Culture   Final    NO GROWTH 5 DAYS Performed at [facility], [address], [place], [place] [address]    Report Status 09/08/2023 FINAL  Final   Radiology Studies: CT HEAD WO CONTRAST ( ) Result Date: 09/12/2023 CLINICAL DATA:  Stroke, follow up EXAM: CT HEAD WITHOUT CONTRAST TECHNIQUE: Contiguous axial images were obtained from the base of the skull through the vertex without intravenous contrast. RADIATION DOSE REDUCTION: This exam was performed according to the departmental dose-optimization program which includes automated exposure control, adjustment of the mA and/or kV according to patient size and/or use of iterative reconstruction  technique. COMPARISON:  CT head September 10, 2023. FINDINGS: Brain: No evidence of acute infarction, hemorrhage, hydrocephalus, extra-axial collection or mass lesion/mass effect. Similar encephalomalacia in the left temporal lobe. Cerebral atrophy. Known recent pontine infarct not well characterized by CT. Vascular: No hyperdense vessel. Skull: No acute fracture. Sinuses/Orbits: Clear sinuses.  No acute orbital findings. Other: No mastoid effusions. IMPRESSION: 1. No evidence of acute intracranial abnormality.  2. Known recent pontine infarct not well characterized by CT. Electronically Signed   By: Feliberto Harts M.D.   On: 09/12/2023 02:37   CT ANGIO HEAD NECK W WO CM (CODE STROKE) Result Date: 09/10/2023 CLINICAL DATA:  Neuro deficit. Stroke suspected. Subacute pontine infarct. New right-sided weakness and facial droop. EXAM: CT ANGIOGRAPHY HEAD AND NECK WITH AND WITHOUT CONTRAST TECHNIQUE: Multidetector CT imaging of the head and neck was performed using the standard protocol during bolus administration of intravenous contrast. Multiplanar CT image reconstructions and MIPs were obtained to evaluate the vascular anatomy. Carotid stenosis measurements (when applicable) are obtained utilizing NASCET criteria, using the distal internal carotid diameter as the denominator. RADIATION DOSE REDUCTION: This exam was performed according to the departmental dose-optimization program which includes automated exposure control, adjustment of the mA and/or kV according to patient size and/or use of iterative reconstruction technique. CONTRAST:  75mL OMNIPAQUE IOHEXOL 350 MG/ML SOLN COMPARISON:  CT head without contrast 09/10/2023. MR head without contrast 09/04/2023. MR angio head 09/06/2023. FINDINGS: CTA NECK FINDINGS Aortic arch: Atherosclerotic calcifications are present at the aortic arch and great vessel origins. A 50% stenosis is present in the proximal left subclavian artery. Common origin of the left common carotid  artery and innominate artery is present without a significant stenosis. No aneurysm is present. No aortic dissection is present. Right carotid system: The right common carotid artery is within normal limits. High-grade calcified stenosis is present at the right carotid bifurcation. The cervical right ICA is normal more distally. Left carotid system: The left common carotid artery to scratched at the left common carotid artery demonstrates some mural calcification without focal stenosis. Dense calcifications are present at the left carotid bifurcation the minimal luminal diameter is 2.5 mm. No significant stenosis is present relative to the more distal vessel. Vertebral arteries: The right vertebral artery is slightly dominant. Both vertebral arteries originate from the subclavian arteries. Moderate stenosis is present at the right vertebral artery origin. No other significant stenoses are present in either vertebral artery in the neck. Skeleton: Multilevel degenerative changes are present throughout the cervical spine. Ankylosis present C5-6. Slight degenerative anterolisthesis present at C2-3 and C3-4. Marked endplate sclerotic changes present at C6-7. Osseous foraminal narrowing present bilaterally at C4-5, C5-6 and on the right at C6-7. Other neck: The soft tissues of the neck are otherwise unremarkable. Salivary glands are within normal limits. Thyroid is normal. No significant adenopathy is present. No focal mucosal or submucosal lesions are present. Upper chest: The lung apices are clear. The thoracic inlet is within normal limits. Review of the MIP images confirms the above findings CTA HEAD FINDINGS Anterior circulation: Minimal calcifications are present within the cavernous internal carotid arteries bilaterally. No significant stenosis is present through the ICA termini. The A1 and M1 segments are normal. The anterior communicating artery is patent. The MCA bifurcations are normal bilaterally. The ACA and  MCA branch vessels are normal bilaterally. Posterior circulation: Atherosclerotic calcifications are present at the dural margin of the left vertebral artery. PICA origins are visualized and normal. The vertebrobasilar junction and basilar artery are normal. Both posterior cerebral arteries originate from the scratched at the superior cerebellar arteries are patent bilaterally. Both posterior cerebral arteries originate from basilar tip. Mild narrowing is present in the mid left P2 segment. The PCA branch vessels are otherwise within normal limits. No aneurysm is present. Venous sinuses: The dural sinuses are patent. The straight sinus and deep cerebral veins are intact. Cortical veins are within normal  limits. No significant vascular malformation is evident. Anatomic variants: None Review of the MIP images confirms the above findings IMPRESSION: 1. High-grade calcified stenosis of the right carotid bifurcation. 2. Dense calcifications at the left carotid bifurcation without significant stenosis relative to the more distal vessel. 3. Moderate stenosis at the dominant right vertebral artery origin. 4. 50% stenosis of the proximal left subclavian artery. 5. Mild narrowing in the mid left P2 segment. 6. No other significant proximal stenosis, aneurysm, or branch vessel occlusion within the Circle of Willis. 7. Multilevel degenerative changes throughout the cervical spine. 8.  Aortic Atherosclerosis (ICD10-I70.0). Electronically Signed   By: Marin Roberts M.D.   On: 09/10/2023 19:32   CT HEAD CODE STROKE WO CONTRAST Result Date: 09/10/2023 CLINICAL DATA:  Code stroke. Neuro deficit, acute, stroke suspected. Patient discharged yesterday following hospitalization for acute pontine infarct. EXAM: CT HEAD WITHOUT CONTRAST TECHNIQUE: Contiguous axial images were obtained from the base of the skull through the vertex without intravenous contrast. RADIATION DOSE REDUCTION: This exam was performed according to the  departmental dose-optimization program which includes automated exposure control, adjustment of the mA and/or kV according to patient size and/or use of iterative reconstruction technique. COMPARISON:  CT head without contrast 09/03/2023. MR head without contrast 09/04/2023. FINDINGS: Brain: The study is moderately degraded by patient motion. The right paramedian pontine infarct is noted. Moderate generalized atrophy and white matter disease is otherwise stable. The ventricles are proportionate to the degree of atrophy. Chronic encephalomalacia is again noted at the left temporal tip. The brainstem and cerebellum are otherwise within normal limits. Midline structures are within normal limits. Vascular: Atherosclerotic calcifications are present within the cavernous internal carotid arteries and at the dural margin of both vertebral arteries. No hyperdense vessel is present. Skull: Calvarium is intact. No focal lytic or blastic lesions are present. No significant extracranial soft tissue lesion is present. Sinuses/Orbits: The paranasal sinuses and mastoid air cells are clear. The globes and orbits are within normal limits. ASPECTS [facility] Stroke Program Early CT Score) - Ganglionic level infarction (caudate, lentiform nuclei, internal capsule, insula, M1-M3 cortex): 7/7 - Supraganglionic infarction (M4-M6 cortex): 3/3 Total score (0-10 with 10 being normal): 10/10 IMPRESSION: 1. Right paramedian pontine infarct. 2. Stable atrophy and white matter disease. This likely reflects the sequela of chronic microvascular ischemia. 3. Chronic encephalomalacia at the left temporal tip. 4. No acute intracranial abnormality or significant interval change. The above was relayed via text pager to Dr. Iver Nestle on 09/10/2023 at 18:40 . Electronically Signed   By: Marin Roberts M.D.   On: 09/10/2023 18:41    Scheduled Meds:   stroke: early stages of recovery book   Does not apply Once   amiodarone  200 mg Oral Daily    atorvastatin  40 mg Oral Daily   memantine  10 mg Oral BID   multivitamin with minerals  1 tablet Oral Daily   pantoprazole (PROTONIX) IV  40 mg Intravenous Q12H   sodium chloride flush  3-10 mL Intravenous Q12H   Continuous Infusions:     LOS: 0 days    Time spent: 35 mins.    Willeen Niece, MD [facility]   If 7PM-7AM, please contact night-coverage

## 2023-09-12 NOTE — Progress Notes (Signed)
 STROKE TEAM PROGRESS NOTE   INTERIM HISTORY/SUBJECTIVE No family at bedside.  Patient reclining in bed, mildly agitated, trying to get out of bed.  However directable.  Not able to get MRI yesterday, repeat CT showed no acute abnormality.  Likely to have a small left MCA infarct not seen on CT.  Right upper extremity strength improved some.  Still has mild right facial droop.  Will start Eliquis tonight.  OBJECTIVE  CBC    Component Value Date/Time   WBC 5.5 09/11/2023 0655   RBC 2.96 (L) 09/11/2023 0655   HGB 9.3 (L) 09/11/2023 0655   HCT 27.4 (L) 09/11/2023 0655   PLT 228 09/11/2023 0655   MCV 92.6 09/11/2023 0655   MCH 31.4 09/11/2023 0655   MCHC 33.9 09/11/2023 0655   RDW 13.8 09/11/2023 0655   LYMPHSABS 1.1 09/10/2023 1915   MONOABS 0.4 09/10/2023 1915   EOSABS 0.2 09/10/2023 1915   BASOSABS 0.0 09/10/2023 1915    BMET    Component Value Date/Time   NA 138 09/11/2023 0655   NA 139 12/30/2020 0000   K 3.5 09/11/2023 0655   CL 106 09/11/2023 0655   CO2 25 09/11/2023 0655   GLUCOSE 117 (H) 09/11/2023 0655   BUN 17 09/11/2023 0655   BUN 19 12/30/2020 0000   CREATININE 1.20 09/11/2023 0655   CALCIUM 8.5 (L) 09/11/2023 0655   GFRNONAA 59 (L) 09/11/2023 0655    IMAGING past 24 hours CT HEAD WO CONTRAST ( ) Result Date: 09/12/2023 CLINICAL DATA:  Stroke, follow up EXAM: CT HEAD WITHOUT CONTRAST TECHNIQUE: Contiguous axial images were obtained from the base of the skull through the vertex without intravenous contrast. RADIATION DOSE REDUCTION: This exam was performed according to the departmental dose-optimization program which includes automated exposure control, adjustment of the mA and/or kV according to patient size and/or use of iterative reconstruction technique. COMPARISON:  CT head September 10, 2023. FINDINGS: Brain: No evidence of acute infarction, hemorrhage, hydrocephalus, extra-axial collection or mass lesion/mass effect. Similar encephalomalacia in the left temporal  lobe. Cerebral atrophy. Known recent pontine infarct not well characterized by CT. Vascular: No hyperdense vessel. Skull: No acute fracture. Sinuses/Orbits: Clear sinuses.  No acute orbital findings. Other: No mastoid effusions. IMPRESSION: 1. No evidence of acute intracranial abnormality. 2. Known recent pontine infarct not well characterized by CT. Electronically Signed   By: Feliberto Harts M.D.   On: 09/12/2023 02:37     Vitals:   09/12/23 0348 09/12/23 0733 09/12/23 1107 09/12/23 1602  BP: (!) 155/60 134/65 (!) 147/62 (!) 169/63  Pulse: (!) 56 60 (!) 56 68  Resp: 16 17 17 17   Temp: 98.3 F (36.8 C) 98.2 F (36.8 C) 98.3 F (36.8 C) 98 F (36.7 C)  TempSrc: Oral Oral Oral Oral  SpO2: 100% 100% 97% 98%  Weight:      Height:         PHYSICAL EXAM General:  Alert, well-nourished, well-developed patient in no acute distress Psych:  Mood and affect appropriate for situation CV: Regular rate and rhythm on monitor Respiratory:  Regular, unlabored respirations on room air GI: Abdomen soft and nontender   NEURO:  Pt reclining in bed, awake, alert, eyes open, hard of hearing, mildly agitated, trying to get out of bed.  Orientated to self only, not to time place or age. Seems to have fluent speech output but only occasionally make sense, largely word salad and not sensible. No gaze palsy, tracking bilaterally, blinking to visual threat bilaterally. Mild  right facial droop. Tongue midline. LUE 4/5 and RUE 3-/5 proximal and 2-/5 hand grip. BLEs 3/5 proximal. Sensation and coordination not cooperative, but RUE apparently ataxic.  Gait not tested.   ASSESSMENT/Donaldson  [name] is a 89 y.o. male with history of  atrial fibrillation on Eliquis, diabetes, hypertension, hyperlipidemia, heart failure, CKD, CAD, Alzheimer's who presents with increasing difficulty walking and some right arm dysfunction he was also noted to have a GI bleed [facility] on Admission 7  Stroke - likely small left  MCA stroke, due to A-fib not on [facility] CT head No acute abnormality. Small vessel disease. Atrophy.    MRI not able to tolerate at this time Repeat CT no acute abnormality CT head and neck high-grade stenosis right carotid bulb.  Left ICA dense calcification without significant stenosis.  Moderate stenosis right VA origin, 50% stenosis left subclavian artery. VTE prophylaxis -SCDs No antithrombotics prior to admission, restart Eliquis tonight.  Continue on discharge Therapy recommendations: SNF Disposition: Pending  History of stroke 08/2023 admitted for right pontine infarct.  CT no acute abnormality.  MRI shows 16 mm acute right pontine infarct.  MRA, carotid Doppler unremarkable.  EF 60 to 65%.  LDL 82, A1c 5.9.  Also had upper GI bleeding, EGD negative.  Wife decided no antithrombotics on discharge and wanted more palliative approach.  Atrial fibrillation Home Meds: Eliquis, amiodarone 200 mg On amiodarone Recent adequate tonight, continue on discharge.  Hypertension Home meds: Amlodipine 2.5 mg, losartan 100 mg Stable now Long-term BP goal normotensive  Hyperlipidemia Home meds: Atorvastatin 20 mg LDL 82, goal < 70 Recently increased atorvastatin to 40 mg Continue statin at discharge  Dementia Continue home Namenda  Recent upper GI bleed Had EGD last admission negative so far Status post iron IV CBC monitoring Resume Eliquis tonight  Other Stroke Risk Factors Advanced age Coronary artery disease  Other Active Problems AKI, creatinine 1.27--1.55 - 1.68--1.97--1.72--2.00--1.20   Hospital day # 0  Neurology will sign off. Please call with questions. Pt will follow up with stroke clinic Dr. Pearlean Donaldson at [facility] in about 4-8 weeks. Thanks for the consult.   Alexander Plan, MD PhD Stroke Neurology 09/12/2023 6:20 PM    To contact Stroke Continuity provider, please refer to [email or web address]. After hours, contact General Neurology

## 2023-09-12 NOTE — Progress Notes (Signed)
 PHARMACY - ANTICOAGULATION CONSULT NOTE  Pharmacy Consult for Apixaban Indication: atrial fibrillation with MCA stroke   Allergies  Allergen Reactions   Lisinopril Cough   Metformin Diarrhea    Patient Measurements: Height: 5\' 4"  (162.6 cm) Weight: 67.2 kg (148 lb 2.4 oz) IBW/kg (Calculated) : 59.2  Vital Signs: Temp: 98 F (36.7 C) (03/04 1602) Temp Source: Oral (03/04 1602) BP: 169/63 (03/04 1602) Pulse Rate: 68 (03/04 1602)  Labs: Recent Labs    09/10/23 1915 09/10/23 1926 09/11/23 0655  HGB 9.0* 9.2* 9.3*  HCT 27.6* 27.0* 27.4*  PLT 232  --  228  APTT 40*  --   --   LABPROT 14.9  --   --   INR 1.2  --   --   CREATININE 1.72* 2.00* 1.20    Estimated Creatinine Clearance: 36.3 mL/min (by C-G formula based on SCr of 1.2 mg/dL).   Medical History: Past Medical History:  Diagnosis Date   Acute encephalopathy 02/12/2018   Alzheimer's dementia (HCC)    CAD (coronary artery disease)    CAP (community acquired pneumonia) 02/11/2018   Carpal tunnel syndrome, bilateral 02/25/2020   Coronary artery disease    Diabetes mellitus    Diverticulitis    Hearing loss    Hypertension    Hypokalemia 02/12/2018   NSTEMI (non-ST elevated myocardial infarction) (HCC)     Medications:  Medications Prior to Admission  Medication Sig Dispense Refill Last Dose/Taking   amiodarone (PACERONE) 200 MG tablet Take 1 tablet (200 mg total) by mouth daily. 30 tablet 3 09/10/2023 at 10:30 AM   amLODipine (NORVASC) 10 MG tablet Take 1 tablet (10 mg total) by mouth daily. 30 tablet 3 09/10/2023   Atorvastatin Calcium 20 MG/5ML SUSP Take 40 mg by mouth every evening.   09/09/2023 Evening   Memantine HCl 10 MG/5ML SOLN Take 5 mLs by mouth 2 (two) times daily at 10 AM and 5 PM. (Patient taking differently: Take 10 mg by mouth in the morning and at bedtime.) 300 mL 3 09/10/2023 Morning   Multiple Vitamin (MULTIVITAMIN) tablet Take 1 tablet by mouth daily with breakfast.   09/10/2023   nitroGLYCERIN  (NITROSTAT) 0.4 MG SL tablet Place 1 tablet (0.4 mg total) under the tongue every 5 (five) minutes x 3 doses as needed for chest pain. 25 tablet 3 Taking As Needed   pantoprazole (PROTONIX) 40 MG tablet Take 1 tablet (40 mg total) by mouth 2 (two) times daily. 60 tablet 11 09/10/2023 Morning   polyethylene glycol (MIRALAX / GLYCOLAX) 17 g packet Take 17 g by mouth in the morning.   09/10/2023 Morning   Scheduled:    stroke: early stages of recovery book   Does not apply Once   amiodarone  200 mg Oral Daily   apixaban  5 mg Oral BID   atorvastatin  40 mg Oral Daily   memantine  10 mg Oral BID   multivitamin with minerals  1 tablet Oral Daily   pantoprazole (PROTONIX) IV  40 mg Intravenous Q12H   sodium chloride flush  3-10 mL Intravenous Q12H   Infusions:   Assessment: Patient with Afib not on anticoagulation and presented with likely small L MCA stroke. Has filled Eliquis 5 mg BID in the recent past (12/24 30 day supply).    Plan:  Start Eliquis 5 mg BID tonight Pharmacy to sign off, please reconsult as needed   Cedric Fishman 09/12/2023,6:29 PM

## 2023-09-13 ENCOUNTER — Ambulatory Visit (INDEPENDENT_AMBULATORY_CARE_PROVIDER_SITE_OTHER): Payer: Non-veteran care | Admitting: Podiatry

## 2023-09-13 ENCOUNTER — Other Ambulatory Visit (HOSPITAL_COMMUNITY): Payer: Self-pay

## 2023-09-13 DIAGNOSIS — I5042 Chronic combined systolic (congestive) and diastolic (congestive) heart failure: Secondary | ICD-10-CM | POA: Diagnosis not present

## 2023-09-13 DIAGNOSIS — R278 Other lack of coordination: Secondary | ICD-10-CM | POA: Diagnosis not present

## 2023-09-13 DIAGNOSIS — I5032 Chronic diastolic (congestive) heart failure: Secondary | ICD-10-CM | POA: Diagnosis not present

## 2023-09-13 DIAGNOSIS — I639 Cerebral infarction, unspecified: Secondary | ICD-10-CM | POA: Diagnosis not present

## 2023-09-13 DIAGNOSIS — R2681 Unsteadiness on feet: Secondary | ICD-10-CM | POA: Diagnosis not present

## 2023-09-13 DIAGNOSIS — I63219 Cerebral infarction due to unspecified occlusion or stenosis of unspecified vertebral arteries: Secondary | ICD-10-CM | POA: Diagnosis not present

## 2023-09-13 DIAGNOSIS — I6980 Unspecified sequelae of other cerebrovascular disease: Secondary | ICD-10-CM | POA: Diagnosis not present

## 2023-09-13 DIAGNOSIS — I480 Paroxysmal atrial fibrillation: Secondary | ICD-10-CM | POA: Diagnosis not present

## 2023-09-13 DIAGNOSIS — N179 Acute kidney failure, unspecified: Secondary | ICD-10-CM | POA: Diagnosis not present

## 2023-09-13 DIAGNOSIS — J309 Allergic rhinitis, unspecified: Secondary | ICD-10-CM | POA: Diagnosis not present

## 2023-09-13 DIAGNOSIS — F0390 Unspecified dementia without behavioral disturbance: Secondary | ICD-10-CM | POA: Diagnosis not present

## 2023-09-13 DIAGNOSIS — G309 Alzheimer's disease, unspecified: Secondary | ICD-10-CM | POA: Diagnosis not present

## 2023-09-13 DIAGNOSIS — E1122 Type 2 diabetes mellitus with diabetic chronic kidney disease: Secondary | ICD-10-CM | POA: Diagnosis not present

## 2023-09-13 DIAGNOSIS — Z7401 Bed confinement status: Secondary | ICD-10-CM | POA: Diagnosis not present

## 2023-09-13 DIAGNOSIS — Z8719 Personal history of other diseases of the digestive system: Secondary | ICD-10-CM

## 2023-09-13 DIAGNOSIS — N1832 Chronic kidney disease, stage 3b: Secondary | ICD-10-CM | POA: Diagnosis not present

## 2023-09-13 DIAGNOSIS — M6259 Muscle wasting and atrophy, not elsewhere classified, multiple sites: Secondary | ICD-10-CM | POA: Diagnosis not present

## 2023-09-13 DIAGNOSIS — I130 Hypertensive heart and chronic kidney disease with heart failure and stage 1 through stage 4 chronic kidney disease, or unspecified chronic kidney disease: Secondary | ICD-10-CM | POA: Diagnosis not present

## 2023-09-13 DIAGNOSIS — Z91198 Patient's noncompliance with other medical treatment and regimen for other reason: Secondary | ICD-10-CM

## 2023-09-13 DIAGNOSIS — M6281 Muscle weakness (generalized): Secondary | ICD-10-CM | POA: Diagnosis not present

## 2023-09-13 DIAGNOSIS — G459 Transient cerebral ischemic attack, unspecified: Secondary | ICD-10-CM | POA: Diagnosis not present

## 2023-09-13 DIAGNOSIS — E119 Type 2 diabetes mellitus without complications: Secondary | ICD-10-CM

## 2023-09-13 DIAGNOSIS — I10 Essential (primary) hypertension: Secondary | ICD-10-CM

## 2023-09-13 DIAGNOSIS — K922 Gastrointestinal hemorrhage, unspecified: Secondary | ICD-10-CM | POA: Diagnosis not present

## 2023-09-13 DIAGNOSIS — E876 Hypokalemia: Secondary | ICD-10-CM | POA: Diagnosis not present

## 2023-09-13 DIAGNOSIS — N189 Chronic kidney disease, unspecified: Secondary | ICD-10-CM | POA: Diagnosis not present

## 2023-09-13 LAB — CBC WITH DIFFERENTIAL/PLATELET
Abs Immature Granulocytes: 0.02 10*3/uL (ref 0.00–0.07)
Basophils Absolute: 0 10*3/uL (ref 0.0–0.1)
Basophils Relative: 1 %
Eosinophils Absolute: 0.2 10*3/uL (ref 0.0–0.5)
Eosinophils Relative: 3 %
HCT: 30.1 % — ABNORMAL LOW (ref 39.0–52.0)
Hemoglobin: 10.2 g/dL — ABNORMAL LOW (ref 13.0–17.0)
Immature Granulocytes: 0 %
Lymphocytes Relative: 23 %
Lymphs Abs: 1.4 10*3/uL (ref 0.7–4.0)
MCH: 31.1 pg (ref 26.0–34.0)
MCHC: 33.9 g/dL (ref 30.0–36.0)
MCV: 91.8 fL (ref 80.0–100.0)
Monocytes Absolute: 0.4 10*3/uL (ref 0.1–1.0)
Monocytes Relative: 6 %
Neutro Abs: 4.2 10*3/uL (ref 1.7–7.7)
Neutrophils Relative %: 67 %
Platelets: 227 10*3/uL (ref 150–400)
RBC: 3.28 MIL/uL — ABNORMAL LOW (ref 4.22–5.81)
RDW: 13.4 % (ref 11.5–15.5)
WBC: 6.2 10*3/uL (ref 4.0–10.5)
nRBC: 0 % (ref 0.0–0.2)

## 2023-09-13 LAB — COMPREHENSIVE METABOLIC PANEL
ALT: 17 U/L (ref 0–44)
AST: 21 U/L (ref 15–41)
Albumin: 3.2 g/dL — ABNORMAL LOW (ref 3.5–5.0)
Alkaline Phosphatase: 52 U/L (ref 38–126)
Anion gap: 12 (ref 5–15)
BUN: 14 mg/dL (ref 8–23)
CO2: 21 mmol/L — ABNORMAL LOW (ref 22–32)
Calcium: 8.3 mg/dL — ABNORMAL LOW (ref 8.9–10.3)
Chloride: 102 mmol/L (ref 98–111)
Creatinine, Ser: 1.13 mg/dL (ref 0.61–1.24)
GFR, Estimated: >60 mL/min (ref >60)
Glucose, Bld: 162 mg/dL — ABNORMAL HIGH (ref 70–99)
Potassium: 3.4 mmol/L — ABNORMAL LOW (ref 3.5–5.1)
Sodium: 135 mmol/L (ref 135–145)
Total Bilirubin: 0.8 mg/dL (ref 0.0–1.2)
Total Protein: 6.1 g/dL — ABNORMAL LOW (ref 6.5–8.1)

## 2023-09-13 LAB — PHOSPHORUS: Phosphorus: 2.5 mg/dL (ref 2.5–4.6)

## 2023-09-13 LAB — MAGNESIUM: Magnesium: 1.8 mg/dL (ref 1.7–2.4)

## 2023-09-13 LAB — GLUCOSE, CAPILLARY
Glucose-Capillary: 107 mg/dL — ABNORMAL HIGH (ref 70–99)
Glucose-Capillary: 192 mg/dL — ABNORMAL HIGH (ref 70–99)

## 2023-09-13 MED ORDER — APIXABAN 5 MG PO TABS: 5.0000 mg | ORAL_TABLET | Freq: Two times a day (BID) | ORAL | Status: AC

## 2023-09-13 MED ORDER — POTASSIUM CHLORIDE CRYS ER 20 MEQ PO TBCR
40.0000 meq | EXTENDED_RELEASE_TABLET | Freq: Two times a day (BID) | ORAL | Status: DC
  Administered 2023-09-13: 40 meq via ORAL
  Filled 2023-09-13: qty 2

## 2023-09-13 MED ORDER — ACETAMINOPHEN 325 MG PO TABS
650.0000 mg | ORAL_TABLET | ORAL | 0 refills | Status: DC | PRN
  Filled 2023-09-13: qty 20, 2d supply, fill #0

## 2023-09-13 MED ORDER — ATORVASTATIN CALCIUM 40 MG PO TABS: 40.0000 mg | ORAL_TABLET | Freq: Every day | ORAL | Status: DC

## 2023-09-13 NOTE — TOC Transition Note (Signed)
 Transition of Care [facility]) - Discharge Note   Patient Details  Name: Alexander Donaldson MRN: [id number] Date of Birth: [birth year]  [facility]) CM/SW Contact:  Eldin Bonsell Felipa Emory, Student-Social Work Phone Number: 09/13/2023, 1:41 PM   Clinical Narrative:  MSWStudent received insurance approval for patient to admit to [facility]. MSW Student confirmed with MD that patient is stable for discharge. MSW Student notified spouse Clydie Braun and they are in agreement with discharge. MSW Student confirmed bed at [facility]. Transport arranged with [facility] for next available.   Number to call Report: [phone] RM: [id number] A     Final next level of care: Skilled Nursing Facility Barriers to Discharge: Barriers Resolved   Patient Goals and CMS Choice Patient states their goals for this hospitalization and ongoing recovery are:: Patient unable to particpiate in goal setting, not full oriented CMS Medicare.gov Compare Post Acute Care list provided to:: Patient Represenative (must comment) Choice offered to / list presented to : Spouse [place] ownership interest in [facility].provided to:: Spouse    Discharge Placement              Patient chooses bed at: [facility] Patient to be transferred to facility by: [facility] Name of family member notified: Clydie Braun Patient and family notified of of transfer: 09/13/23  Discharge Plan and Services Additional resources added to the After Visit Summary for       Post Acute Care Choice: Skilled Nursing Facility                               Social Drivers of Health (SDOH) Interventions SDOH Screenings   Food Insecurity: No Food Insecurity (09/11/2023)  Housing: Low Risk  (09/11/2023)  Transportation Needs: No Transportation Needs (09/11/2023)  Utilities: Patient Unable To Answer (09/11/2023)  Alcohol Screen: Low Risk  (07/02/2023)  Depression (PHQ2-9): Low Risk  (07/02/2023)  Financial Resource Strain: Low Risk  (07/02/2023)   Physical Activity: Inactive (07/02/2023)  Social Connections: Unknown (09/11/2023)  Recent Concern: Social Connections - Socially Isolated (07/02/2023)  Stress: Patient Unable To Answer (07/02/2023)  Tobacco Use: Low Risk  (09/10/2023)  Health Literacy: Patient Unable To Answer (07/02/2023)     Readmission Risk Interventions     No data to display

## 2023-09-13 NOTE — Hospital Course (Addendum)
 89 year old African-American male with past medical history significant for Mannam to dementia, CKD stage IIIb, diabetes mellitus type 2, proximal atrial fibrillation and other comorbidities who was discharged on 09/09/2023 and then presented back because of right-sided weakness and facial droop.  Last admission is admitted for an acute ischemic CVA and had a GI bleed with negative endoscopic evaluation and was placed on twice daily Protonix.  He had acute on chronic blood loss at that time and was transfused 1 unit PRBCs.  His anticoagulant was held at that time due to his high risk of bleed and given that he had an acute CVA now again neurology was consulted and he underwent stroke workup again.  Recommended resuming his anticoagulation and he is now stable for discharge.  Has no further bleeding and will need continued goals of care conversation with palliative in outpatient setting.  He is medically stable to be discharged to SNF at this time  Assessment and Plan:  Acute New CVA likely Left MCA stroke due to Afib  -Had been off of [facility] and repeat head CT showed no acute Abnormality. Unable to tolerate MRI. Repeat Head CT done and showed no acute Abnormality. CTA Head and Neck showed high-grade stenosis in the R Carotid Bulb and Left ICA dense calcification w/o Significant Stenosis. PT/OT Recommending SNF. Neuro recommends resuming AC.  Continue with palliative care discussions outpatient setting.  Recent Right [place] CVA likely from small vessel disease rather than embolus: Symptoms included right facial droop, slurred speech and inability to ambulate and presented and had a head CT which showed no acute intracranial abnormality and generalized atrophy with chronic microvascular disease findings -MRI of the brain done last admission was significantly motion degraded but did show a 16 mm acute infarct in the right aspect of the pons along with mild to moderate chronic small vessel disease changes within the  cerebral white matter and a chronic left thalamic lacunar infarct.  As well as generalized cerebral and cerebellar atrophy -Neurology recommends recommend favoring anticoagulation -ECHO showed LVEF of 60-65% with no RWMA, mild concentric LVH of the basal-septal segment and LVDP c/w G1DD. Normal RVSF   Recent GI Bleeding and Heme Positive Stools Noromocytic Anemia: Last Admission he had a history of GI bleed (requiring pRBC) including black tarry stools and underwent EGD which did not show any evidence of bleeding.  GI recommended PPI 40 mg of Protonix daily.  Due to his GI bleeding antibiotics and plate therapy had been referred but now hemoglobin/hematocrit remained stable.  Anemia panel last admission showed an iron level 41, UIBC 263, TIBC of 304, saturation ratios of 14%, ferritin level of 9. Hgb/Hct improved and is now 10.2/30.1. CTM for S/Sx of Bleeding; Repeat CBC w/in 1 week now that [facility] has been resumed  Hypokalemia: K+ is now 3.4. Replete with po Kcl 40 mEQ BID x2. CTM and Trend and Repeat CMP in 1 week   Chronic Systolic and Diastolic CHF -Strict I's and O's and Daily Weights; is + 19.4 mL since Admit. CTM for S/Sx of volume Overload   Paroxysmal Atrial Fibrillation: Initially AC given concern for GI Bleed; CHA2DS2-VASc of 5. Now [facility] is resumed per Neuro   Essential Hypertension: Given his acute CVA will allow for permissive hypertension as well. SLP now recommending Diet. CTM BP per Protocol. Last BP reading was 134/64   AKI on CKD Stage 3b/Metabolic Acidosis: Improved. BUN is now 17/1.20 (Admission was 29/2.00).  Has a slight MA with a CO2 of 21,  AG of 12, Chloride Level of 102. Avoid Nephrotoxic Medications, Contrast Dyes, Hypotension and Dehydration to Ensure Adequate Renal Perfusion and will need to Renally Adjust Meds. CTM and Trend Renal Function carefully and repeat CMP in the AM    Diabetes Mellitus Type 2: HbA1c is 5.9. CTM CBGs per protocol with very sensitive NovoLog/scale  insulin AC. CBGs ranging from 107-172   Hypokalemia: K+ Improved to 3.5 on last check. CTM and Trend and repeat CMP in the AM    Hypoalbuminemia: Patient's Albumin was 3.2 on last check. CTM and Trend and repeat CMP in the AM   Overweight: Complicates overall prognosis and care.Estimated body mass index is 25.43 kg/m as calculated from the following:   Height as of this encounter: 5\' 4"  (1.626 m).   Weight as of this encounter: 67.2 kg.. Weight Loss and Dietary Counseling given

## 2023-09-13 NOTE — Discharge Instructions (Signed)

## 2023-09-13 NOTE — Discharge Summary (Signed)
 Physician Discharge Summary   Patient: Alexander Donaldson MRN: [id number] DOB: 1933-09-16  Admit date:     09/10/2023  Discharge date: 09/13/23  Discharge Physician: Marguerita Merles, DO   [facility]P: [facility]   Recommendations at discharge:   With PCP within 1 to 2 weeks and repeat CBC, CMP, mag, Phos within 1 week Follow-up with Dr. Pearlean Brownie at the stroke clinic in about 4 to 8 weeks Follow-up with gastroenterology in outpatient setting  Discharge Diagnoses: Principal Problem:   Acute CVA (cerebrovascular accident) [facility]) Active Problems:   Acute kidney injury superimposed on stage 3b chronic kidney disease (HCC)   Iron deficiency anemia due to chronic blood loss   CVA (cerebral vascular accident) (HCC)   History of GI bleed   Non-insulin dependent type 2 diabetes mellitus (HCC)   Essential hypertension   Dementia without behavioral disturbance (HCC)   Goals of care, counseling/discussion   Paroxysmal atrial fibrillation (HCC)   Chronic diastolic CHF (congestive heart failure) (HCC)   History of CVA 09/04/2023 (cerebrovascular accident)   Palliative care by specialist   DNR (do not resuscitate)  Resolved Problems:   * No resolved hospital problems. *  Hospital Course: 89 year old African-American male with past medical history significant for Mannam to dementia, CKD stage IIIb, diabetes mellitus type 2, proximal atrial fibrillation and other comorbidities who was discharged on 09/09/2023 and then presented back because of right-sided weakness and facial droop.  Last admission is admitted for an acute ischemic CVA and had a GI bleed with negative endoscopic evaluation and was placed on twice daily Protonix.  He had acute on chronic blood loss at that time and was transfused 1 unit PRBCs.  His anticoagulant was held at that time due to his high risk of bleed and given that he had an acute CVA now again neurology was consulted and he underwent stroke workup again.  Recommended resuming  his anticoagulation and he is now stable for discharge.  Has no further bleeding and will need continued goals of care conversation with palliative in outpatient setting.  He is medically stable to be discharged to SNF at this time  Assessment and Plan:  Acute New CVA likely Left MCA stroke due to Afib  -Had been off of [facility] and repeat head CT showed no acute Abnormality. Unable to tolerate MRI. Repeat Head CT done and showed no acute Abnormality. CTA Head and Neck showed high-grade stenosis in the R Carotid Bulb and Left ICA dense calcification w/o Significant Stenosis. PT/OT Recommending SNF. Neuro recommends resuming AC.  Continue with palliative care discussions outpatient setting.  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  As well as generalized cerebral and cerebellar atrophy -Neurology recommends recommend favoring anticoagulation -ECHO showed LVEF of 60-65% with no RWMA, mild concentric LVH of the basal-septal segment and LVDP c/w G1DD. Normal RVSF   Recent GI Bleeding and Heme Positive Stools Noromocytic Anemia: Last Admission he had a history of GI bleed (requiring pRBC) including black tarry stools and  underwent EGD which did not show any evidence of bleeding.  GI recommended PPI 40 mg of Protonix daily.  Due to his GI bleeding antibiotics and plate therapy had been referred but now hemoglobin/hematocrit remained stable.  Anemia panel last admission showed an  iron level 41, UIBC 263, TIBC of 304, saturation ratios of 14%, ferritin level of 9. Hgb/Hct improved and is now 10.2/30.1. CTM for S/Sx of Bleeding; Repeat CBC w/in 1 week now that [facility] has been resumed  Hypokalemia: K+ is now 3.4. Replete with po Kcl 40 mEQ BID x2. CTM and Trend and Repeat CMP in 1 week   Chronic Systolic and Diastolic CHF -Strict I's and O's and Daily Weights; is + 19.4 mL since Admit. CTM for S/Sx of volume Overload   Paroxysmal Atrial Fibrillation: Initially AC given concern for GI Bleed; CHA2DS2-VASc of 5. Now [facility] is resumed per Neuro   Essential Hypertension: Given his acute CVA will allow for permissive hypertension as well. SLP now recommending Diet. CTM BP per Protocol. Last BP reading was 134/64   AKI on CKD Stage 3b/Metabolic Acidosis: Improved. BUN is now 17/1.20 (Admission was 29/2.00).  Has a slight MA with a CO2 of 21, AG of 12, Chloride Level of 102. Avoid Nephrotoxic Medications, Contrast Dyes, Hypotension and Dehydration to Ensure Adequate Renal Perfusion and will need to Renally Adjust Meds. CTM and Trend Renal Function carefully and repeat CMP in the AM    Diabetes Mellitus Type 2: HbA1c is 5.9. CTM CBGs per protocol with very sensitive NovoLog/scale insulin AC. CBGs ranging from 107-172   Hypokalemia: K+ Improved to 3.5 on last check. CTM and Trend and repeat CMP in the AM    Hypoalbuminemia: Patient's Albumin was 3.2 on last check. CTM and Trend and repeat CMP in the AM   Overweight: Complicates overall prognosis and care.Estimated body mass index is 25.43 kg/m as calculated from the following:   Height as of this encounter: 5\' 4"  (1.626 m).   Weight as of this encounter: 67.2 kg.. Weight Loss and Dietary Counseling given   Consultants: Neurology Procedures performed: As delineated as above Disposition: Skilled nursing facility Diet recommendation:  Discharge Diet Orders (From admission, onward)     Start     Ordered   09/13/23 0000  Diet - low  sodium heart healthy        09/13/23 1200   09/13/23 0000  Diet Carb Modified        09/13/23 1200           Cardiac and Carb modified diet DISCHARGE MEDICATION: Allergies as of 09/13/2023       Reactions   Lisinopril Cough   Metformin Diarrhea        Medication List     STOP taking these medications    amLODipine 10 MG tablet Commonly known as: NORVASC   Atorvastatin Calcium 20 MG/5ML Susp Replaced by: atorvastatin 40 MG tablet       TAKE these medications    acetaminophen 325 MG tablet Commonly known as: TYLENOL Take 2 tablets (650 mg total) by mouth every 4 (four) hours as needed for mild pain (pain score 1-3) (or temp > 37.5 C (99.5 F)).   amiodarone 200 MG tablet Commonly known as: PACERONE Take 1 tablet (200 mg total) by mouth daily.   apixaban 5 MG Tabs tablet Commonly known as: ELIQUIS Take 1 tablet (5 mg total) by mouth 2 (two) times daily.   atorvastatin 40 MG tablet Commonly known as:  LIPITOR Take 1 tablet (40 mg total) by mouth daily. Start taking on: September 14, 2023 Replaces: Atorvastatin Calcium 20 MG/5ML Susp   Memantine HCl 10 MG/5ML Soln Take 5 mLs by mouth 2 (two) times daily at 10 AM and 5 PM. What changed:  how much to take when to take this   multivitamin tablet Take 1 tablet by mouth daily with breakfast.   nitroGLYCERIN 0.4 MG SL tablet Commonly known as: NITROSTAT Place 1 tablet (0.4 mg total) under the tongue every 5 (five) minutes x 3 doses as needed for chest pain.   pantoprazole 40 MG tablet Commonly known as: PROTONIX Take 1 tablet (40 mg total) by mouth 2 (two) times daily.   polyethylene glycol 17 g packet Commonly known as: MIRALAX / GLYCOLAX Take 17 g by mouth in the morning.        Follow-up Information     Micki Riley, MD. Schedule an appointment as soon as possible for a visit in 1 month(s).   Specialties: Neurology, Radiology Why: stroke clinic Contact information: [address] [place] [address] [phone]                Discharge Exam: Ceasar Mons Weights   09/10/23 1818 09/11/23 1826  Weight: 70.8 kg 67.2 kg   Vitals:   09/13/23 0745 09/13/23 1110  BP: (!) 148/80 134/64  Pulse: 72 64  Resp: 18 17  Temp: 98.5 F (36.9 C) 97.6 F (36.4 C)  SpO2: 100% 100%   Examination: Physical Exam:  Constitutional: Elderly AAM in NAD appears calm Respiratory: Diminished to auscultation bilaterally, no wheezing, rales, rhonchi or crackles. Normal respiratory effort and patient is not tachypenic. No accessory muscle use. Unlabored breathing Cardiovascular: RRR, no murmurs / rubs / gallops. S1 and S2 auscultated. No extremity edema. Abdomen: Soft, non-tender, Mildly distended Bowel sounds positive.  GU: Deferred. Musculoskeletal: No clubbing / cyanosis of digits/nails. No joint deformity upper and lower extremities. Skin: No rashes, lesions, ulcers on limited skin evaluation Neurologic: Has decreased strength on the right side compared to his left Psychiatric: He is awake and alert  Condition at discharge: stable  The results of significant diagnostics from this hospitalization (including imaging, microbiology, ancillary and laboratory) are listed below for reference.   Imaging Studies: CT HEAD WO CONTRAST ( ) Result Date: 09/12/2023 CLINICAL DATA:  Stroke, follow up EXAM: CT HEAD WITHOUT CONTRAST TECHNIQUE: Contiguous axial images were obtained from the base of the skull through the vertex without intravenous contrast. RADIATION DOSE REDUCTION: This exam was performed according to the departmental dose-optimization program which includes automated exposure control, adjustment of the mA and/or kV according to patient size and/or use of iterative reconstruction technique. COMPARISON:  CT head September 10, 2023. FINDINGS: Brain: No evidence of acute infarction, hemorrhage, hydrocephalus, extra-axial collection or mass lesion/mass effect. Similar encephalomalacia  in the left temporal lobe. Cerebral atrophy. Known recent pontine infarct not well characterized by CT. Vascular: No hyperdense vessel. Skull: No acute fracture. Sinuses/Orbits: Clear sinuses.  No acute orbital findings. Other: No mastoid effusions. IMPRESSION: 1. No evidence of acute intracranial abnormality. 2. Known recent pontine infarct not well characterized by CT. Electronically Signed   By: Feliberto Harts M.D.   On: 09/12/2023 02:37   CT ANGIO HEAD NECK W WO CM (CODE STROKE) Result Date: 09/10/2023 CLINICAL DATA:  Neuro deficit. Stroke suspected. Subacute pontine infarct. New right-sided weakness and facial droop. EXAM: CT ANGIOGRAPHY HEAD AND NECK WITH AND WITHOUT CONTRAST TECHNIQUE: Multidetector CT  imaging of the head and neck was performed using the standard protocol during bolus administration of intravenous contrast. Multiplanar CT image reconstructions and MIPs were obtained to evaluate the vascular anatomy. Carotid stenosis measurements (when applicable) are obtained utilizing NASCET criteria, using the distal internal carotid diameter as the denominator. RADIATION DOSE REDUCTION: This exam was performed according to the departmental dose-optimization program which includes automated exposure control, adjustment of the mA and/or kV according to patient size and/or use of iterative reconstruction technique. CONTRAST:  75mL OMNIPAQUE IOHEXOL 350 MG/ML SOLN COMPARISON:  CT head without contrast 09/10/2023. MR head without contrast 09/04/2023. MR angio head 09/06/2023. FINDINGS: CTA NECK FINDINGS Aortic arch: Atherosclerotic calcifications are present at the aortic arch and great vessel origins. A 50% stenosis is present in the proximal left subclavian artery. Common origin of the left common carotid artery and innominate artery is present without a significant stenosis. No aneurysm is present. No aortic dissection is present. Right carotid system: The right common carotid artery is within normal  limits. High-grade calcified stenosis is present at the right carotid bifurcation. The cervical right ICA is normal more distally. Left carotid system: The left common carotid artery to scratched at the left common carotid artery demonstrates some mural calcification without focal stenosis. Dense calcifications are present at the left carotid bifurcation the minimal luminal diameter is 2.5 mm. No significant stenosis is present relative to the more distal vessel. Vertebral arteries: The right vertebral artery is slightly dominant. Both vertebral arteries originate from the subclavian arteries. Moderate stenosis is present at the right vertebral artery origin. No other significant stenoses are present in either vertebral artery in the neck. Skeleton: Multilevel degenerative changes are present throughout the cervical spine. Ankylosis present C5-6. Slight degenerative anterolisthesis present at C2-3 and C3-4. Marked endplate sclerotic changes present at C6-7. Osseous foraminal narrowing present bilaterally at C4-5, C5-6 and on the right at C6-7. Other neck: The soft tissues of the neck are otherwise unremarkable. Salivary glands are within normal limits. Thyroid is normal. No significant adenopathy is present. No focal mucosal or submucosal lesions are present. Upper chest: The lung apices are clear. The thoracic inlet is within normal limits. Review of the MIP images confirms the above findings CTA HEAD FINDINGS Anterior circulation: Minimal calcifications are present within the cavernous internal carotid arteries bilaterally. No significant stenosis is present through the ICA termini. The A1 and M1 segments are normal. The anterior communicating artery is patent. The MCA bifurcations are normal bilaterally. The ACA and MCA branch vessels are normal bilaterally. Posterior circulation: Atherosclerotic calcifications are present at the dural margin of the left vertebral artery. PICA origins are visualized and normal.  The vertebrobasilar junction and basilar artery are normal. Both posterior cerebral arteries originate from the scratched at the superior cerebellar arteries are patent bilaterally. Both posterior cerebral arteries originate from basilar tip. Mild narrowing is present in the mid left P2 segment. The PCA branch vessels are otherwise within normal limits. No aneurysm is present. Venous sinuses: The dural sinuses are patent. The straight sinus and deep cerebral veins are intact. Cortical veins are within normal limits. No significant vascular malformation is evident. Anatomic variants: None Review of the MIP images confirms the above findings IMPRESSION: 1. High-grade calcified stenosis of the right carotid bifurcation. 2. Dense calcifications at the left carotid bifurcation without significant stenosis relative to the more distal vessel. 3. Moderate stenosis at the dominant right vertebral artery origin. 4. 50% stenosis of the proximal left subclavian artery. 5. Mild  narrowing in the mid left P2 segment. 6. No other significant proximal stenosis, aneurysm, or branch vessel occlusion within the Circle of Willis. 7. Multilevel degenerative changes throughout the cervical spine. 8.  Aortic Atherosclerosis (ICD10-I70.0). Electronically Signed   By: Marin Roberts M.D.   On: 09/10/2023 19:32   CT HEAD CODE STROKE WO CONTRAST Result Date: 09/10/2023 CLINICAL DATA:  Code stroke. Neuro deficit, acute, stroke suspected. Patient discharged yesterday following hospitalization for acute pontine infarct. EXAM: CT HEAD WITHOUT CONTRAST TECHNIQUE: Contiguous axial images were obtained from the base of the skull through the vertex without intravenous contrast. RADIATION DOSE REDUCTION: This exam was performed according to the departmental dose-optimization program which includes automated exposure control, adjustment of the mA and/or kV according to patient size and/or use of iterative reconstruction technique. COMPARISON:   CT head without contrast 09/03/2023. MR head without contrast 09/04/2023. FINDINGS: Brain: The study is moderately degraded by patient motion. The right paramedian pontine infarct is noted. Moderate generalized atrophy and white matter disease is otherwise stable. The ventricles are proportionate to the degree of atrophy. Chronic encephalomalacia is again noted at the left temporal tip. The brainstem and cerebellum are otherwise within normal limits. Midline structures are within normal limits. Vascular: Atherosclerotic calcifications are present within the cavernous internal carotid arteries and at the dural margin of both vertebral arteries. No hyperdense vessel is present. Skull: Calvarium is intact. No focal lytic or blastic lesions are present. No significant extracranial soft tissue lesion is present. Sinuses/Orbits: The paranasal sinuses and mastoid air cells are clear. The globes and orbits are within normal limits. ASPECTS [facility] Stroke Program Early CT Score) - Ganglionic level infarction (caudate, lentiform nuclei, internal capsule, insula, M1-M3 cortex): 7/7 - Supraganglionic infarction (M4-M6 cortex): 3/3 Total score (0-10 with 10 being normal): 10/10 IMPRESSION: 1. Right paramedian pontine infarct. 2. Stable atrophy and white matter disease. This likely reflects the sequela of chronic microvascular ischemia. 3. Chronic encephalomalacia at the left temporal tip. 4. No acute intracranial abnormality or significant interval change. The above was relayed via text pager to Dr. Iver Nestle on 09/10/2023 at 18:40 . Electronically Signed   By: Marin Roberts M.D.   On: 09/10/2023 18:41   MR ANGIO HEAD WO CONTRAST Result Date: 09/06/2023 CLINICAL DATA:  Follow-up examination for stroke. EXAM: MRA HEAD WITHOUT CONTRAST TECHNIQUE: Angiographic images of the Circle of Willis were acquired using MRA technique without intravenous contrast. COMPARISON:  MRI from 09/04/2023. FINDINGS: Anterior circulation:  Examination moderately to severely degraded by motion artifact. Both internal carotid arteries are patent through the siphons without visible stenosis. There is question of a 2-3 mm outpouching extending superiorly from the left ICA terminus (series 5, image 132), which could reflect a small aneurysm. Difficult to be certain given motion degradation on this exam. A1 segments patent bilaterally. Grossly normal anterior communicating artery complex. Anterior cerebral arteries patent without stenosis. No M1 stenosis or occlusion. Distal MCA branches perfused and fairly symmetric. Posterior circulation: Both V4 segments patent without stenosis. Right vertebral artery dominant. Both PICA patent at their origins. Basilar patent without stenosis. Superior cerebral arteries patent bilaterally. Both PCAs primarily supplied via the basilar. PCAs are patent to their distal aspects without visible stenosis. Anatomic variants: None significant. Other: None. IMPRESSION: 1. Motion degraded exam. 2. Negative intracranial MRA for large vessel occlusion. No hemodynamically significant or correctable stenosis. 3. Question 2-3 mm outpouching extending superiorly from the left ICA terminus, suspicious for a small aneurysm. Difficult to be certain of this finding given  motion degradation on this exam. Attention at follow-up recommended. Electronically Signed   By: Rise Mu M.D.   On: 09/06/2023 18:46   VAS US CAROTID Result Date: 09/06/2023 Carotid Arterial Duplex Study Patient Name:  Alexander Donaldson  Date of Exam:   09/06/2023 Medical Rec #: [id number]        Accession #:    [id number] Date of Birth: [birth year]         Patient Gender: M Patient Age:   42 years Exam Location:  [facility] Procedure:      VAS US CAROTID Referring Phys: Gevena Mart --------------------------------------------------------------------------------  Indications:       CVA. Risk Factors:      Hypertension, Diabetes, no history of smoking,  prior MI,                    coronary artery disease. Other Factors:     Afib, CHF. Comparison Study:  No previous exams Performing Technologist: Jody Hill RVT, RDMS  Examination Guidelines: A complete evaluation includes B-mode imaging, spectral Doppler, color Doppler, and power Doppler as needed of all accessible portions of each vessel. Bilateral testing is considered an integral part of a complete examination. Limited examinations for reoccurring indications may be performed as noted.  Right Carotid Findings: +----------+--------+--------+--------+------------------+------------------+           PSV cm/sEDV cm/sStenosisPlaque DescriptionComments           +----------+--------+--------+--------+------------------+------------------+ CCA Prox  61      10              calcific          intimal thickening +----------+--------+--------+--------+------------------+------------------+ CCA Distal55      9               calcific          intimal thickening +----------+--------+--------+--------+------------------+------------------+ ICA Prox  96      17      1-39%   smooth            Shadowing          +----------+--------+--------+--------+------------------+------------------+ ICA Distal44      9                                                    +----------+--------+--------+--------+------------------+------------------+ ECA       208     2       >50%    calcific          shadowing          +----------+--------+--------+--------+------------------+------------------+ +----------+--------+-------+----------------+-------------------+           PSV cm/sEDV cmsDescribe        Arm Pressure (mmHG) +----------+--------+-------+----------------+-------------------+ [id number]             Multiphasic, WNL                    +----------+--------+-------+----------------+-------------------+ +---------+--------+--+--------+-+---------+ VertebralPSV cm/s49EDV  cm/s9Antegrade +---------+--------+--+--------+-+---------+  Left Carotid Findings: +----------+-------+-------+--------+---------------------------------+--------+           PSV    EDV    StenosisPlaque Description               Comments           cm/s   cm/s                                                     +----------+-------+-------+--------+---------------------------------+--------+  CCA Prox  84     12             calcific                                  +----------+-------+-------+--------+---------------------------------+--------+ CCA Distal73     11                                                       +----------+-------+-------+--------+---------------------------------+--------+ ICA Prox  95     16     1-39%   irregular, heterogenous and                                               calcific                                  +----------+-------+-------+--------+---------------------------------+--------+ ICA Distal59     12                                                       +----------+-------+-------+--------+---------------------------------+--------+ ECA       130    7              calcific                                  +----------+-------+-------+--------+---------------------------------+--------+ +----------+--------+--------+---------+-------------------+           PSV cm/sEDV cm/sDescribe Arm Pressure (mmHG) +----------+--------+--------+---------+-------------------+ Subclavian213             Turbulent                    +----------+--------+--------+---------+-------------------+ +---------+--------+--+--------+-+---------+ VertebralPSV cm/s40EDV cm/s9Antegrade +---------+--------+--+--------+-+---------+   Summary: Right Carotid: Velocities in the right ICA are consistent with a 1-39% stenosis. Left Carotid: Velocities in the left ICA are consistent with a 1-39% stenosis.               Unstable plaque seen  in proximal ICA. Vertebrals:  Bilateral vertebral arteries demonstrate antegrade flow. Subclavians: Left subclavian artery flow was disturbed. Normal flow hemodynamics              were seen in the right subclavian artery. *See table(s) above for measurements and observations.  Electronically signed by Coral Else MD on 09/06/2023 at 4:45:04 PM.    Final    ECHOCARDIOGRAM COMPLETE Result Date: 09/05/2023    ECHOCARDIOGRAM REPORT   Patient Name:   Alexander Donaldson Date of Exam: 09/05/2023 Medical Rec #:  [id number]       Height:       64.0 in Accession #:    [id number]      Weight:       151.9 lb Date of Birth:  05-Jun-1934        BSA:          1.740 m Patient Age:  89 years        BP:           175/66 mmHg Patient Gender: M               HR:           62 bpm. Exam Location:  Inpatient Procedure: 2D Echo, Cardiac Doppler and Color Doppler (Both Spectral and Color            Flow Doppler were utilized during procedure). Indications:    Stroke I63.9  History:        Patient has prior history of Echocardiogram examinations, most                 recent 07/02/2023. Previous Myocardial Infarction and CAD, TIA,                 Arrythmias:Atrial Fibrillation; Risk Factors:Diabetes.  Sonographer:    Lucendia Herrlich RCS Referring Phys: [id number] Kateri Mc LATIF [facility] IMPRESSIONS  1. Left ventricular ejection fraction, by estimation, is 60 to 65%. The left ventricle has normal function. The left ventricle has no regional wall motion abnormalities. There is mild concentric left ventricular hypertrophy of the basal-septal segment. Left ventricular diastolic parameters are consistent with Grade I diastolic dysfunction (impaired relaxation).  2. Right ventricular systolic function is normal. The right ventricular size is normal.  3. Left atrial size was mildly dilated.  4. The mitral valve is normal in structure. Trivial mitral valve regurgitation. No evidence of mitral stenosis.  5. The aortic valve is moderately calcified. The right  and left coronary cusps are fused. The aortic valve is tricuspid. There is mild calcification of the aortic valve. Aortic valve regurgitation is trivial. Mild aortic valve stenosis. FINDINGS  Left Ventricle: Left ventricular ejection fraction, by estimation, is 60 to 65%. The left ventricle has normal function. The left ventricle has no regional wall motion abnormalities. Strain imaging was not performed. The left ventricular internal cavity  size was normal in size. There is mild concentric left ventricular hypertrophy of the basal-septal segment. Left ventricular diastolic parameters are consistent with Grade I diastolic dysfunction (impaired relaxation). Right Ventricle: The right ventricular size is normal. No increase in right ventricular wall thickness. Right ventricular systolic function is normal. Left Atrium: Left atrial size was mildly dilated. Right Atrium: Right atrial size was normal in size. Pericardium: There is no evidence of pericardial effusion. Mitral Valve: The mitral valve is normal in structure. Mild mitral annular calcification. Trivial mitral valve regurgitation. No evidence of mitral valve stenosis. Tricuspid Valve: The tricuspid valve is normal in structure. Tricuspid valve regurgitation is trivial. No evidence of tricuspid stenosis. Aortic Valve: The aortic valve is moderately calcified. The right and left coronary cusps are fused. The aortic valve is tricuspid. There is mild calcification of the aortic valve. Aortic valve regurgitation is trivial. Mild aortic stenosis is present. Aortic valve mean gradient measures 5.0 mmHg. Aortic valve peak gradient measures 10.0 mmHg. Aortic valve area, by VTI measures 1.50 cm. Pulmonic Valve: The pulmonic valve was normal in structure. Pulmonic valve regurgitation is trivial. No evidence of pulmonic stenosis. Aorta: The aortic root is normal in size and structure. Venous: The inferior vena cava was not well visualized. IAS/Shunts: No atrial level  shunt detected by color flow Doppler. Additional Comments: 3D imaging was not performed.  LEFT VENTRICLE PLAX 2D LVIDd:         3.30 cm     Diastology LVIDs:  2.60 cm     LV e' medial:    4.57 cm/s LV PW:         0.90 cm     LV E/e' medial:  10.2 LV IVS:        1.20 cm     LV e' lateral:   5.98 cm/s LVOT diam:     2.10 cm     LV E/e' lateral: 7.8 LV SV:         50 LV SV Index:   29 LVOT Area:     3.46 cm  LV Volumes (MOD) LV vol d, MOD A2C: 80.8 ml LV vol d, MOD A4C: 64.8 ml LV vol s, MOD A2C: 41.9 ml LV vol s, MOD A4C: 35.9 ml LV SV MOD A2C:     38.9 ml LV SV MOD A4C:     64.8 ml LV SV MOD BP:      36.1 ml RIGHT VENTRICLE RV S prime:     14.30 cm/s TAPSE (M-mode): 1.0 cm LEFT ATRIUM             Index        RIGHT ATRIUM          Index LA diam:        3.90 cm 2.24 cm/m   RA Area:     8.47 cm LA Vol (A2C):   51.5 ml 29.59 ml/m  RA Volume:   12.70 ml 7.30 ml/m LA Vol (A4C):   48.4 ml 27.81 ml/m LA Biplane Vol: 49.2 ml 28.27 ml/m  AORTIC VALVE AV Area (Vmax):    1.63 cm AV Area (Vmean):   1.45 cm AV Area (VTI):     1.50 cm AV Vmax:           158.00 cm/s AV Vmean:          108.000 cm/s AV VTI:            0.333 m AV Peak Grad:      10.0 mmHg AV Mean Grad:      5.0 mmHg LVOT Vmax:         74.15 cm/s LVOT Vmean:        45.200 cm/s LVOT VTI:          0.144 m LVOT/AV VTI ratio: 0.43  AORTA Ao Root diam: 3.60 cm Ao Asc diam:  3.50 cm MITRAL VALVE               TRICUSPID VALVE MV Area (PHT): 2.20 cm    TR Peak grad:   18.5 mmHg MV Decel Time: 345 msec    TR Vmax:        215.00 cm/s MV E velocity: 46.60 cm/s MV A velocity: 95.40 cm/s  SHUNTS MV E/A ratio:  0.49        Systemic VTI:  0.14 m                            Systemic Diam: 2.10 cm Arvilla Meres MD Electronically signed by Arvilla Meres MD Signature Date/Time: 09/05/2023/2:08:26 PM    Final    MR Brain Wo Contrast (neuro protocol) Result Date: 09/04/2023 CLINICAL DATA:  Provided history: Transient ischemic attack (TIA). Additional history  provided: Facial droop, slurred speech, weakness, confusion, agitation. EXAM: MRI HEAD WITHOUT CONTRAST TECHNIQUE: Multiplanar, multiecho pulse sequences of the brain and surrounding structures were obtained without intravenous contrast. COMPARISON:  Head CT 09/03/2023.  Brain MRI 03/31/2020.  FINDINGS: Significantly motion degraded examination, limiting evaluation. Most notably, the axial diffusion-weighted sequence is severely motion degraded, the axial SWI sequence is severely motion degraded and the axial FLAIR sequence is severely motion degraded. Within these limitations, findings are as follows. Brain: Moderate-to-advanced generalized cerebral atrophy. Commensurate prominence of the ventricles sulci. Mild cerebellar atrophy. 16 mm acute infarct within the right aspect of the pons (for instance as seen on series 3, image 16) (series 4, image 17). Multifocal T2 FLAIR hyperintense signal abnormality within the cerebral white matter, nonspecific but compatible with mild-to-moderate chronic small vessel ischemic disease. Chronic lacunar infarct within the left thalamus. No evidence of an intracranial mass. No extra-axial fluid collection. No midline shift. Vascular: Maintained flow voids within the proximal large arterial vessels. Skull and upper cervical spine: No focal worrisome marrow lesion. Ligamentous hypertrophy about the dens. Incompletely assessed cervical spondylosis. Sinuses/Orbits: No mass or acute finding within the imaged orbits. Mild mucosal thickening within a posterior right ethmoid air cell. IMPRESSION: 1. Significantly motion degraded examination. Within this limitation, findings are as follows. 2. 16 mm acute infarct within the right aspect of the pons. 3. Mild-to-moderate chronic small vessel ischemic changes within the cerebral white matter. 4. Chronic left thalamic lacunar infarct. 5. Moderate-to-advanced generalized cerebral atrophy. 6. Mild cerebellar atrophy. 7. Minor right ethmoid sinus  disease. Electronically Signed   By: Jackey Loge D.O.   On: 09/04/2023 10:21   CT HEAD WO CONTRAST ( ) Result Date: 09/03/2023 CLINICAL DATA:  Right lower extremity weakness EXAM: CT HEAD WITHOUT CONTRAST TECHNIQUE: Contiguous axial images were obtained from the base of the skull through the vertex without intravenous contrast. RADIATION DOSE REDUCTION: This exam was performed according to the departmental dose-optimization program which includes automated exposure control, adjustment of the mA and/or kV according to patient size and/or use of iterative reconstruction technique. COMPARISON:  07/02/2023 FINDINGS: Brain: There is no mass, hemorrhage or extra-axial collection. There is generalized atrophy without lobar predilection. Hypodensity of the white matter is most commonly associated with chronic microvascular disease. Vascular: No hyperdense vessel or unexpected vascular calcification. Skull: The visualized skull base, calvarium and extracranial soft tissues are normal. Sinuses/Orbits: No fluid levels or advanced mucosal thickening of the visualized paranasal sinuses. No mastoid or middle ear effusion. Normal orbits. Other: None. IMPRESSION: 1. No acute intracranial abnormality. 2. Generalized atrophy and findings of chronic microvascular disease. Electronically Signed   By: Deatra Robinson M.D.   On: 09/03/2023 20:08   Microbiology: Results for orders placed or performed during the hospital encounter of 09/03/23  Blood culture (routine x 2)     Status: None   Collection Time: 09/03/23  6:57 PM   Specimen: BLOOD LEFT HAND  Result Value Ref Range Status   Specimen Description BLOOD LEFT HAND  Final   Special Requests   Final    BOTTLES DRAWN AEROBIC AND ANAEROBIC Blood Culture results may not be optimal due to an inadequate volume of blood received in culture bottles   Culture   Final    NO GROWTH 5 DAYS Performed at [facility], [address] [place], [place] [address]    Report  Status 09/08/2023 FINAL  Final  Blood culture (routine x 2)     Status: None   Collection Time: 09/03/23  7:02 PM   Specimen: BLOOD  Result Value Ref Range Status   Specimen Description BLOOD RIGHT ANTECUBITAL  Final   Special Requests   Final    BOTTLES DRAWN AEROBIC AND ANAEROBIC  Blood Culture adequate volume   Culture   Final    NO GROWTH 5 DAYS Performed at [facility], [address] [place], [place] [address]    Report Status 09/08/2023 FINAL  Final   Labs: CBC: Recent Labs  Lab 09/07/23 [phone] 09/08/23 0958 09/09/23 0652 09/10/23 1915 09/10/23 1926 09/11/23 0655 09/13/23 0940  WBC 6.6 5.8 5.3 4.5  --  5.5 6.2  NEUTROABS 3.6 3.5 3.0 2.9  --   --  4.2  HGB 9.6* 8.9* 8.7* 9.0* 9.2* 9.3* 10.2*  HCT 28.5* 26.4* 25.7* 27.6* 27.0* 27.4* 30.1*  MCV 92.2 92.0 92.1 95.2  --  92.6 91.8  PLT 219 226 221 232  --  228 227   Basic Metabolic Panel: Recent Labs  Lab 09/08/23 0958 09/09/23 0652 09/10/23 1915 09/10/23 1926 09/11/23 0655 09/13/23 0940  NA 139 140 137 139 138 135  K 3.9 3.7 3.7 3.7 3.5 3.4*  CL 108 106 103 105 106 102  CO2 23 25 22   --  25 21*  GLUCOSE 128* 118* 178* 170* 117* 162*  BUN 30* 29* 27* 29* 17 14  CREATININE 1.97* 1.67* 1.72* 2.00* 1.20 1.13  CALCIUM 8.7* 8.6* 8.4*  --  8.5* 8.3*  MG  --   --   --   --   --  1.8  PHOS  --   --   --   --   --  2.5   Liver Function Tests: Recent Labs  Lab 09/10/23 1915 09/13/23 0940  AST 20 21  ALT 13 17  ALKPHOS 50 52  BILITOT 0.7 0.8  PROT 6.0* 6.1*  ALBUMIN 3.3* 3.2*   CBG: Recent Labs  Lab 09/12/23 1109 09/12/23 1622 09/12/23 2115 09/13/23 0628 09/13/23 1112  GLUCAP 142* 137* 172* 107* 192*   Discharge time spent: greater than 30 minutes.  Signed: Marguerita Merles, DO [facility] 09/13/2023

## 2023-09-13 NOTE — Progress Notes (Signed)
 SLP Cancellation Note  Patient Details Name: Alexander Donaldson MRN: [id number] DOB: [birth year]   Cancelled treatment:       Reason Eval/Treat Not Completed: Other (comment) Order received for swallow eval but note that pt passed the [facility] swallow screen and has been on a PO diet since 3/2. RN not aware of any difficulties swallowing and says that he has not had any overt difficulties when she has observed him with thin liquids and meds crushed in puree. SLP will therefore defer swallow eval.   RN also reports that [facility] is en route for pt to d/c to SNF. Recommend SLP cognitive-linguistic assessment as indicated at that level of care.     Mahala Menghini., M.A. CCC-SLP Acute Rehabilitation Services Office [phone]  Secure chat preferred  09/13/2023, 3:44 PM

## 2023-09-13 NOTE — Consult Note (Signed)
 [facility] Liaison Consult Note   09/13/2023  Alexander Donaldson [birth year] [id number]  Insurance: [employer]  Primary Care Provider: Center, [facility] Dr[facility] Richrd Sox per wife this provider is listed for the transition of care follow up appointments.     [facility] Liaison met patient at bedside at [facility].  Patient admitted with Acute CVA    The patient was screened for less than  7 day readmission hospitalization with noted high risk score for unplanned readmission risk 3 hospital admissions in 6 months.  The patient was assessed for potential [facility] Coordination service needs for post hospital transition for care coordination. Review of patient's electronic medical record reveals patient is for SNF rehab. Wife at bedside and states PCP is [place] in [place], made her aware of appointment with [facility] in August 14. 2025 for AWV.  She endorses [facility]. [facility] with [facility].  Plan: [facility] Liaison will continue to follow progress and disposition to asess for post hospital community care coordination/management needs.  Referral request for community care coordination: No referral needed.  Will alert [facility] RN PAC of findings and transition to [facility].   [facility] Community Care, Population Health does not replace or interfere with any arrangements made by the Inpatient Transition of Care team.   For questions contact:   Charlesetta Shanks, RN, BSN, CCM [place]  [facility] Liaison Direct Dial: [phone] or secure chat Email: [email or web address]

## 2023-09-13 NOTE — Progress Notes (Signed)
 1. Failure to attend appointment with reason given    In hospital.

## 2023-09-14 DIAGNOSIS — E1122 Type 2 diabetes mellitus with diabetic chronic kidney disease: Secondary | ICD-10-CM | POA: Diagnosis not present

## 2023-09-14 DIAGNOSIS — I480 Paroxysmal atrial fibrillation: Secondary | ICD-10-CM | POA: Diagnosis not present

## 2023-09-14 DIAGNOSIS — N189 Chronic kidney disease, unspecified: Secondary | ICD-10-CM | POA: Diagnosis not present

## 2023-09-14 DIAGNOSIS — I130 Hypertensive heart and chronic kidney disease with heart failure and stage 1 through stage 4 chronic kidney disease, or unspecified chronic kidney disease: Secondary | ICD-10-CM | POA: Diagnosis not present

## 2023-09-14 DIAGNOSIS — I639 Cerebral infarction, unspecified: Secondary | ICD-10-CM | POA: Diagnosis not present

## 2023-09-14 DIAGNOSIS — K922 Gastrointestinal hemorrhage, unspecified: Secondary | ICD-10-CM | POA: Diagnosis not present

## 2023-09-14 DIAGNOSIS — I5042 Chronic combined systolic (congestive) and diastolic (congestive) heart failure: Secondary | ICD-10-CM | POA: Diagnosis not present

## 2023-09-14 DIAGNOSIS — E876 Hypokalemia: Secondary | ICD-10-CM | POA: Diagnosis not present

## 2023-09-15 DIAGNOSIS — R2681 Unsteadiness on feet: Secondary | ICD-10-CM | POA: Diagnosis not present

## 2023-09-15 DIAGNOSIS — I6980 Unspecified sequelae of other cerebrovascular disease: Secondary | ICD-10-CM | POA: Diagnosis not present

## 2023-09-15 DIAGNOSIS — R278 Other lack of coordination: Secondary | ICD-10-CM | POA: Diagnosis not present

## 2023-09-15 DIAGNOSIS — G309 Alzheimer's disease, unspecified: Secondary | ICD-10-CM | POA: Diagnosis not present

## 2023-09-18 DIAGNOSIS — I480 Paroxysmal atrial fibrillation: Secondary | ICD-10-CM | POA: Diagnosis not present

## 2023-09-18 DIAGNOSIS — J309 Allergic rhinitis, unspecified: Secondary | ICD-10-CM | POA: Diagnosis not present

## 2023-09-18 DIAGNOSIS — K922 Gastrointestinal hemorrhage, unspecified: Secondary | ICD-10-CM | POA: Diagnosis not present

## 2023-09-18 DIAGNOSIS — I5042 Chronic combined systolic (congestive) and diastolic (congestive) heart failure: Secondary | ICD-10-CM | POA: Diagnosis not present

## 2023-09-18 DIAGNOSIS — I639 Cerebral infarction, unspecified: Secondary | ICD-10-CM | POA: Diagnosis not present

## 2023-09-18 DIAGNOSIS — E1122 Type 2 diabetes mellitus with diabetic chronic kidney disease: Secondary | ICD-10-CM | POA: Diagnosis not present

## 2023-09-18 DIAGNOSIS — I130 Hypertensive heart and chronic kidney disease with heart failure and stage 1 through stage 4 chronic kidney disease, or unspecified chronic kidney disease: Secondary | ICD-10-CM | POA: Diagnosis not present

## 2023-09-18 DIAGNOSIS — E876 Hypokalemia: Secondary | ICD-10-CM | POA: Diagnosis not present

## 2023-09-18 DIAGNOSIS — N189 Chronic kidney disease, unspecified: Secondary | ICD-10-CM | POA: Diagnosis not present

## 2023-09-19 DIAGNOSIS — E876 Hypokalemia: Secondary | ICD-10-CM | POA: Diagnosis not present

## 2023-09-19 DIAGNOSIS — I5042 Chronic combined systolic (congestive) and diastolic (congestive) heart failure: Secondary | ICD-10-CM | POA: Diagnosis not present

## 2023-09-19 DIAGNOSIS — I480 Paroxysmal atrial fibrillation: Secondary | ICD-10-CM | POA: Diagnosis not present

## 2023-09-19 DIAGNOSIS — I6980 Unspecified sequelae of other cerebrovascular disease: Secondary | ICD-10-CM | POA: Diagnosis not present

## 2023-09-19 DIAGNOSIS — R2681 Unsteadiness on feet: Secondary | ICD-10-CM | POA: Diagnosis not present

## 2023-09-19 DIAGNOSIS — I130 Hypertensive heart and chronic kidney disease with heart failure and stage 1 through stage 4 chronic kidney disease, or unspecified chronic kidney disease: Secondary | ICD-10-CM | POA: Diagnosis not present

## 2023-09-19 DIAGNOSIS — K922 Gastrointestinal hemorrhage, unspecified: Secondary | ICD-10-CM | POA: Diagnosis not present

## 2023-09-19 DIAGNOSIS — N189 Chronic kidney disease, unspecified: Secondary | ICD-10-CM | POA: Diagnosis not present

## 2023-09-19 DIAGNOSIS — R278 Other lack of coordination: Secondary | ICD-10-CM | POA: Diagnosis not present

## 2023-09-19 DIAGNOSIS — G309 Alzheimer's disease, unspecified: Secondary | ICD-10-CM | POA: Diagnosis not present

## 2023-09-19 DIAGNOSIS — I639 Cerebral infarction, unspecified: Secondary | ICD-10-CM | POA: Diagnosis not present

## 2023-09-19 DIAGNOSIS — E1122 Type 2 diabetes mellitus with diabetic chronic kidney disease: Secondary | ICD-10-CM | POA: Diagnosis not present

## 2023-09-20 DIAGNOSIS — N189 Chronic kidney disease, unspecified: Secondary | ICD-10-CM | POA: Diagnosis not present

## 2023-09-20 DIAGNOSIS — I5042 Chronic combined systolic (congestive) and diastolic (congestive) heart failure: Secondary | ICD-10-CM | POA: Diagnosis not present

## 2023-09-20 DIAGNOSIS — I130 Hypertensive heart and chronic kidney disease with heart failure and stage 1 through stage 4 chronic kidney disease, or unspecified chronic kidney disease: Secondary | ICD-10-CM | POA: Diagnosis not present

## 2023-09-20 DIAGNOSIS — K922 Gastrointestinal hemorrhage, unspecified: Secondary | ICD-10-CM | POA: Diagnosis not present

## 2023-09-20 DIAGNOSIS — E876 Hypokalemia: Secondary | ICD-10-CM | POA: Diagnosis not present

## 2023-09-20 DIAGNOSIS — I639 Cerebral infarction, unspecified: Secondary | ICD-10-CM | POA: Diagnosis not present

## 2023-09-20 DIAGNOSIS — J309 Allergic rhinitis, unspecified: Secondary | ICD-10-CM | POA: Diagnosis not present

## 2023-09-20 DIAGNOSIS — I480 Paroxysmal atrial fibrillation: Secondary | ICD-10-CM | POA: Diagnosis not present

## 2023-09-20 DIAGNOSIS — E1122 Type 2 diabetes mellitus with diabetic chronic kidney disease: Secondary | ICD-10-CM | POA: Diagnosis not present

## 2023-09-22 DIAGNOSIS — K922 Gastrointestinal hemorrhage, unspecified: Secondary | ICD-10-CM | POA: Diagnosis not present

## 2023-09-22 DIAGNOSIS — I480 Paroxysmal atrial fibrillation: Secondary | ICD-10-CM | POA: Diagnosis not present

## 2023-09-22 DIAGNOSIS — I130 Hypertensive heart and chronic kidney disease with heart failure and stage 1 through stage 4 chronic kidney disease, or unspecified chronic kidney disease: Secondary | ICD-10-CM | POA: Diagnosis not present

## 2023-09-22 DIAGNOSIS — I5042 Chronic combined systolic (congestive) and diastolic (congestive) heart failure: Secondary | ICD-10-CM | POA: Diagnosis not present

## 2023-09-22 DIAGNOSIS — I6980 Unspecified sequelae of other cerebrovascular disease: Secondary | ICD-10-CM | POA: Diagnosis not present

## 2023-09-22 DIAGNOSIS — E1122 Type 2 diabetes mellitus with diabetic chronic kidney disease: Secondary | ICD-10-CM | POA: Diagnosis not present

## 2023-09-22 DIAGNOSIS — R2681 Unsteadiness on feet: Secondary | ICD-10-CM | POA: Diagnosis not present

## 2023-09-22 DIAGNOSIS — G309 Alzheimer's disease, unspecified: Secondary | ICD-10-CM | POA: Diagnosis not present

## 2023-09-22 DIAGNOSIS — E876 Hypokalemia: Secondary | ICD-10-CM | POA: Diagnosis not present

## 2023-09-22 DIAGNOSIS — R278 Other lack of coordination: Secondary | ICD-10-CM | POA: Diagnosis not present

## 2023-09-22 DIAGNOSIS — N189 Chronic kidney disease, unspecified: Secondary | ICD-10-CM | POA: Diagnosis not present

## 2023-09-22 DIAGNOSIS — I639 Cerebral infarction, unspecified: Secondary | ICD-10-CM | POA: Diagnosis not present

## 2023-09-25 DIAGNOSIS — I639 Cerebral infarction, unspecified: Secondary | ICD-10-CM | POA: Diagnosis not present

## 2023-09-25 DIAGNOSIS — E1122 Type 2 diabetes mellitus with diabetic chronic kidney disease: Secondary | ICD-10-CM | POA: Diagnosis not present

## 2023-09-25 DIAGNOSIS — I5042 Chronic combined systolic (congestive) and diastolic (congestive) heart failure: Secondary | ICD-10-CM | POA: Diagnosis not present

## 2023-09-25 DIAGNOSIS — I480 Paroxysmal atrial fibrillation: Secondary | ICD-10-CM | POA: Diagnosis not present

## 2023-09-25 DIAGNOSIS — N189 Chronic kidney disease, unspecified: Secondary | ICD-10-CM | POA: Diagnosis not present

## 2023-09-25 DIAGNOSIS — E876 Hypokalemia: Secondary | ICD-10-CM | POA: Diagnosis not present

## 2023-09-25 DIAGNOSIS — I130 Hypertensive heart and chronic kidney disease with heart failure and stage 1 through stage 4 chronic kidney disease, or unspecified chronic kidney disease: Secondary | ICD-10-CM | POA: Diagnosis not present

## 2023-09-25 DIAGNOSIS — K922 Gastrointestinal hemorrhage, unspecified: Secondary | ICD-10-CM | POA: Diagnosis not present

## 2023-09-28 DIAGNOSIS — I5042 Chronic combined systolic (congestive) and diastolic (congestive) heart failure: Secondary | ICD-10-CM | POA: Diagnosis not present

## 2023-09-28 DIAGNOSIS — K922 Gastrointestinal hemorrhage, unspecified: Secondary | ICD-10-CM | POA: Diagnosis not present

## 2023-09-28 DIAGNOSIS — J309 Allergic rhinitis, unspecified: Secondary | ICD-10-CM | POA: Diagnosis not present

## 2023-09-28 DIAGNOSIS — I639 Cerebral infarction, unspecified: Secondary | ICD-10-CM | POA: Diagnosis not present

## 2023-09-28 DIAGNOSIS — N189 Chronic kidney disease, unspecified: Secondary | ICD-10-CM | POA: Diagnosis not present

## 2023-09-28 DIAGNOSIS — E876 Hypokalemia: Secondary | ICD-10-CM | POA: Diagnosis not present

## 2023-09-28 DIAGNOSIS — I130 Hypertensive heart and chronic kidney disease with heart failure and stage 1 through stage 4 chronic kidney disease, or unspecified chronic kidney disease: Secondary | ICD-10-CM | POA: Diagnosis not present

## 2023-09-28 DIAGNOSIS — E1122 Type 2 diabetes mellitus with diabetic chronic kidney disease: Secondary | ICD-10-CM | POA: Diagnosis not present

## 2023-09-28 DIAGNOSIS — I480 Paroxysmal atrial fibrillation: Secondary | ICD-10-CM | POA: Diagnosis not present

## 2023-09-29 DIAGNOSIS — I5042 Chronic combined systolic (congestive) and diastolic (congestive) heart failure: Secondary | ICD-10-CM | POA: Diagnosis not present

## 2023-09-29 DIAGNOSIS — E876 Hypokalemia: Secondary | ICD-10-CM | POA: Diagnosis not present

## 2023-09-29 DIAGNOSIS — I130 Hypertensive heart and chronic kidney disease with heart failure and stage 1 through stage 4 chronic kidney disease, or unspecified chronic kidney disease: Secondary | ICD-10-CM | POA: Diagnosis not present

## 2023-09-29 DIAGNOSIS — E1122 Type 2 diabetes mellitus with diabetic chronic kidney disease: Secondary | ICD-10-CM | POA: Diagnosis not present

## 2023-09-29 DIAGNOSIS — N189 Chronic kidney disease, unspecified: Secondary | ICD-10-CM | POA: Diagnosis not present

## 2023-09-29 DIAGNOSIS — K922 Gastrointestinal hemorrhage, unspecified: Secondary | ICD-10-CM | POA: Diagnosis not present

## 2023-09-29 DIAGNOSIS — I639 Cerebral infarction, unspecified: Secondary | ICD-10-CM | POA: Diagnosis not present

## 2023-09-29 DIAGNOSIS — I480 Paroxysmal atrial fibrillation: Secondary | ICD-10-CM | POA: Diagnosis not present

## 2023-11-02 DIAGNOSIS — I255 Ischemic cardiomyopathy: Secondary | ICD-10-CM | POA: Diagnosis not present

## 2023-11-02 DIAGNOSIS — I4891 Unspecified atrial fibrillation: Secondary | ICD-10-CM | POA: Diagnosis not present

## 2023-11-02 DIAGNOSIS — I639 Cerebral infarction, unspecified: Secondary | ICD-10-CM | POA: Diagnosis not present

## 2023-11-02 DIAGNOSIS — I2510 Atherosclerotic heart disease of native coronary artery without angina pectoris: Secondary | ICD-10-CM | POA: Diagnosis not present

## 2023-11-06 ENCOUNTER — Inpatient Hospital Stay: Admitting: Adult Health

## 2023-11-13 ENCOUNTER — Encounter: Payer: Self-pay | Admitting: Podiatry

## 2023-11-13 ENCOUNTER — Ambulatory Visit (INDEPENDENT_AMBULATORY_CARE_PROVIDER_SITE_OTHER): Admitting: Podiatry

## 2023-11-13 DIAGNOSIS — M79675 Pain in left toe(s): Secondary | ICD-10-CM | POA: Diagnosis not present

## 2023-11-13 DIAGNOSIS — M2041 Other hammer toe(s) (acquired), right foot: Secondary | ICD-10-CM

## 2023-11-13 DIAGNOSIS — E119 Type 2 diabetes mellitus without complications: Secondary | ICD-10-CM

## 2023-11-13 DIAGNOSIS — B351 Tinea unguium: Secondary | ICD-10-CM | POA: Diagnosis not present

## 2023-11-13 DIAGNOSIS — M79674 Pain in right toe(s): Secondary | ICD-10-CM

## 2023-11-13 DIAGNOSIS — E1159 Type 2 diabetes mellitus with other circulatory complications: Secondary | ICD-10-CM

## 2023-11-13 DIAGNOSIS — M21372 Foot drop, left foot: Secondary | ICD-10-CM | POA: Diagnosis not present

## 2023-11-13 DIAGNOSIS — M2042 Other hammer toe(s) (acquired), left foot: Secondary | ICD-10-CM | POA: Diagnosis not present

## 2023-11-18 NOTE — Progress Notes (Signed)
 ANNUAL DIABETIC FOOT EXAM  Subjective: Alexander Donaldson presents today for annual diabetic foot exam. He is accompanied by his wife on today's visit. Wife states he has had two strokes since his last visit here. Chief Complaint  Patient presents with   Diabetes    "Trim his toenails."  Dr. Creasie Doctor at [facility] - 10/11/2023; A1c - 6.3   Patient confirms h/o diabetes.  Patient denies any h/o foot wounds.  Center[facility] is patient's PCP.  Past Medical History:  Diagnosis Date   Acute encephalopathy 02/12/2018   Alzheimer's dementia (HCC)    CAD (coronary artery disease)    CAP (community acquired pneumonia) 02/11/2018   Carpal tunnel syndrome, bilateral 02/25/2020   Coronary artery disease    Diabetes mellitus    Diverticulitis    Hearing loss    Hypertension    Hypokalemia 02/12/2018   NSTEMI (non-ST elevated myocardial infarction) [facility])    Patient Active Problem List   Diagnosis Date Noted   Palliative care by specialist 09/11/2023   DNR (do not resuscitate) 09/11/2023   CVA (cerebral vascular accident) (HCC) 09/10/2023   History of CVA 09/04/2023 (cerebrovascular accident) 09/10/2023   History of GI bleed 09/10/2023   Acute CVA (cerebrovascular accident) (HCC) 09/10/2023   Iron  deficiency anemia due to chronic blood loss 09/07/2023   Heme positive stool 09/07/2023   TIA (transient ischemic attack) 09/04/2023   Acute upper GI bleed 09/04/2023   Acute blood loss anemia 09/04/2023   Facial droop 09/04/2023   Weakness of right upper extremity 09/04/2023   Acute kidney injury superimposed on stage 3b chronic kidney disease (HCC) 09/04/2023   Paroxysmal atrial fibrillation (HCC) 09/04/2023   Chronic diastolic CHF (congestive heart failure) (HCC) 09/04/2023   Atrial fibrillation with RVR (HCC) 07/02/2023   NSTEMI, initial episode of care (HCC) 07/02/2023   Exposure to COVID-19 virus 03/23/2023   Dysphagia, unspecified 02/01/2023   Goals of care,  counseling/discussion 02/01/2023   Arthralgia 07/05/2022   NSTEMI (non-ST elevated myocardial infarction) (HCC)    Accelerated junctional rhythm 01/30/2022   Dementia without behavioral disturbance (HCC) 01/30/2022   Carpal tunnel syndrome, bilateral 02/25/2020   Numbness 11/28/2019   Raynaud's phenomenon 10/30/2019   CAD (coronary artery disease) 02/12/2018   Left foot drop 02/12/2018   Non-insulin  dependent type 2 diabetes mellitus (HCC) 02/12/2018   Essential hypertension 02/12/2018   Hypokalemia 02/12/2018   Past Surgical History:  Procedure Laterality Date   ANGIOPLASTY     CORONARY STENT INTERVENTION N/A 02/02/2017   Procedure: Coronary Stent Intervention;  Surgeon: Chapman Commodore, MD;  [facility]: [facility] INVASIVE CV LAB;  Service: Cardiovascular;  Laterality: N/A;   CORONARY STENTS     ESOPHAGOGASTRODUODENOSCOPY (EGD) WITH PROPOFOL  N/A 09/08/2023   Procedure: ESOPHAGOGASTRODUODENOSCOPY (EGD) WITH PROPOFOL ;  Surgeon: Albertina Hugger, MD;  Location: [facility] ENDOSCOPY;  Service: Gastroenterology;  Laterality: N/A;   LEFT HEART CATH AND CORONARY ANGIOGRAPHY N/A 02/02/2017   Procedure: Left Heart Cath and Coronary Angiography;  Surgeon: Chapman Commodore, MD;  Location: [facility] INVASIVE CV LAB;  Service: Cardiovascular;  Laterality: N/A;   LEFT HEART CATHETERIZATION WITH CORONARY ANGIOGRAM N/A 03/16/2012   Procedure: LEFT HEART CATHETERIZATION WITH CORONARY ANGIOGRAM;  Surgeon: Sharene Dauer, MD;  Location: [facility] CATH LAB;  Service: Cardiovascular;  Laterality: N/A;   PERCUTANEOUS CORONARY STENT INTERVENTION (PCI-S)  03/16/2012   Procedure: PERCUTANEOUS CORONARY STENT INTERVENTION (PCI-S);  Surgeon: Sharene Dauer, MD;  Location: [facility] CATH LAB;  Service: Cardiovascular;;   TONSILLECTOMY  Current Outpatient Medications on File Prior to Visit  Medication Sig Dispense Refill   acetaminophen  (TYLENOL ) 325 MG tablet Take 2 tablets (650 mg total) by mouth every 4 (four) hours as needed for mild pain (pain  score 1-3) (or temp > 37.5 C (99.5 F)). 20 tablet 0   amiodarone  (PACERONE ) 200 MG tablet Take 1 tablet (200 mg total) by mouth daily. 30 tablet 3   apixaban  (ELIQUIS ) 5 MG TABS tablet Take 1 tablet (5 mg total) by mouth 2 (two) times daily.     atorvastatin  (LIPITOR ) 40 MG tablet Take 1 tablet (40 mg total) by mouth daily.     Memantine  HCl 10 MG/5ML SOLN Take 5 mLs by mouth 2 (two) times daily at 10 AM and 5 PM. (Patient taking differently: Take 10 mg by mouth in the morning and at bedtime.) 300 mL 3   Multiple Vitamin (MULTIVITAMIN) tablet Take 1 tablet by mouth daily with breakfast.     nitroGLYCERIN  (NITROSTAT ) 0.4 MG SL tablet Place 1 tablet (0.4 mg total) under the tongue every 5 (five) minutes x 3 doses as needed for chest pain. 25 tablet 3   polyethylene glycol (MIRALAX  / GLYCOLAX ) 17 g packet Take 17 g by mouth in the morning.     pantoprazole  (PROTONIX ) 40 MG tablet Take 1 tablet (40 mg total) by mouth 2 (two) times daily. 60 tablet 11   No current facility-administered medications on file prior to visit.    Allergies  Allergen Reactions   Lisinopril Cough   Metformin  Diarrhea   Social History   Occupational History   Not on file  Tobacco Use   Smoking status: Never   Smokeless tobacco: Never  Vaping Use   Vaping status: Never Used  Substance and Sexual Activity   Alcohol use: No    Comment: social   Drug use: No   Sexual activity: Yes    Birth control/protection: None   History reviewed. No pertinent family history. Immunization History  Administered Date(s) Administered   Influenza, Seasonal, Injecte, Preservative Fre 05/11/2011, 04/27/2012, 05/20/2013, 07/22/2014   Influenza,inj,Quad PF,6+ Mos 04/18/2019, 06/17/2020   Influenza,trivalent, recombinat, inj, PF 05/08/2017, 05/11/2018   Influenza-Unspecified 06/03/2015, 05/11/2016, 04/11/2019   PFIZER Comirnaty(Gray Top)Covid-19 Tri-Sucrose Vaccine 01/12/2021   PFIZER(Purple Top)SARS-COV-2 Vaccination 08/17/2019,  09/11/2019, 04/25/2020, 10/20/2021   Pfizer Covid-19 Vaccine Bivalent Booster 107yrs & up 10/20/2021   Pfizer(Comirnaty)Fall Seasonal Vaccine 12 years and older 08/18/2022, 05/23/2023   Pneumococcal Conjugate-13 02/20/2014   Pneumococcal Polysaccharide-23 04/27/2012   Tdap 11/23/2011   Zoster Recombinant(Shingrix) 12/23/2019, 05/25/2020     Review of Systems: Negative except as noted in the HPI.   Objective: There were no vitals filed for this visit.  Alexander Donaldson is a pleasant 89 y.o. male in NAD. AAO X 3.  Diabetic foot exam was performed with the following findings:   Vascular Examination: CFT <3 seconds b/l. DP pulses faintly palpable b/l. PT pulses nonpalpable b/l. Digital hair absent. Skin temperature gradient warm to warm b/l. No pain with calf compression. No ischemia or gangrene. No cyanosis or clubbing noted b/l. No edema noted b/l LE.   Neurological Examination:Pt has subjective symptoms of neuropathy. Patient unable to follow commands of LE neurological examination due to cognitive deficits. Patient does respond to external noxious stimuli.  Dermatological Examination: Pedal skin warm and supple b/l. No open wounds b/l. No interdigital macerations. Toenails 1-5 b/l thick, discolored, elongated with subungual debris and pain on dorsal palpation.  No corns, calluses nor porokeratotic lesions  noted.  Musculoskeletal Examination: Dropfoot left lower extremity. Muscle strength 5/5 to all lower extremity muscle groups bilaterally. Hammertoe deformity noted 2-5 b/l.  Radiographs: None     Lab Results  Component Value Date   HGBA1C 5.9 (H) 09/04/2023   ADA Risk Categorization: High Risk  Patient has one or more of the following: Loss of protective sensation Absent pedal pulses Severe Foot deformity History of foot ulcer  Assessment: 1. Pain due to onychomycosis of toenails of both feet   2. Type 2 diabetes mellitus with vascular disease (HCC)   3. Left foot drop    4. Acquired hammertoes of both feet   5. Encounter for diabetic foot exam (HCC)     Plan: Diabetic foot examination performed today. All patient's and/or POA's questions/concerns addressed on today's visit. Toenails 1-5 debrided in length and girth without incident. Continue foot and shoe inspections daily. Monitor blood glucose per PCP/Endocrinologist's recommendations. Continue soft, supportive shoe gear daily. Report any pedal injuries to medical professional. Call office if there are any questions/concerns. -Patient/POA to call should there be question/concern in the interim. Return in about 3 months (around 02/13/2024).  Luella Sager, DPM      [facility] LOCATION: [address], [place] [address]                   Office [phone]   [facility] LOCATION: [address] [place], [place] [address] Office [phone]

## 2023-12-07 ENCOUNTER — Ambulatory Visit: Admitting: Neurology

## 2023-12-07 ENCOUNTER — Encounter: Payer: Self-pay | Admitting: Neurology

## 2023-12-07 VITALS — BP 145/72 | HR 59 | Ht 64.0 in | Wt 149.0 lb

## 2023-12-07 DIAGNOSIS — I6300 Cerebral infarction due to thrombosis of unspecified precerebral artery: Secondary | ICD-10-CM

## 2023-12-07 DIAGNOSIS — R29898 Other symptoms and signs involving the musculoskeletal system: Secondary | ICD-10-CM

## 2023-12-07 DIAGNOSIS — F01C18 Vascular dementia, severe, with other behavioral disturbance: Secondary | ICD-10-CM | POA: Diagnosis not present

## 2023-12-07 NOTE — Progress Notes (Signed)
 [facility] [address] [place]. [place] [address] [phone]       OFFICE FOLLOW UP VISIT NOTE  Alexander Donaldson Date of Birth:  21-Jan-1934 Medical Record Number:  [id number]   Referring MD: Jess Morita, NP Reason for Referral: Hand numbness HPI: Initial visit 01/20/2020: Alexander Donaldson is a pleasant 89 year old African-American male with past medical history of diabetes, hypertension, diverticulitis, coronary artery disease who has been complaining of bilateral hand numbness for the last 6 months.  Reports it is intermittently but most prominently at night or early in the morning when he wakes up with his right thumb and index finger feeling stiff and numb.  He feels his strength is good and he can still hold objects very well but his fingers feel different.  This is often severe at night and he often wakes up with this.  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  He has not been wearing any wrist splints.  Denies any neck pain, radicular pain, gait or balance problems.  Review of electronic medical records show that he had lab work on 11/28/2019 which include vitamin B12 which was 284 mg percent and hemoglobin A1c was elevated at 8.5.  Comprehensive metabolic panel labs were normal.  TSH was 1.91 on 01/22/2019.  She has not had any x-rays of his C-spine, MRI or EMG nerve conduction studies done. Update 03/31/2020: He returns for follow-up after last visit 2 months ago.  He continues to have weakness in his hand but states that the numbness is only in the index and middle fingertips and is not as bothersome.  EMG nerve conduction study done by Dr. Tilda Fogo on 02/25/2020 confirmed severe end-stage carpal tunnel bilaterally right more than left with wasting and no activity in the right abductor pollicis on stimulation.  There is also chronic stable overlying C5 radiculopathy.  Patient's wife reports  today that for the last year or so she is noted progressive cognitive decline.  Recent short-term memory difficulties.  Is also had some behavioral changes and gets irritable and agitated easily.  Is quite forgetful.  Will not follow multistage commands and often gets distracted easily.  Also cannot have a proper conversation at times gets sidetracked.  She has been doing and more things for him.  Patient has no family history of Alzheimer's.  He has no prior history of strokes, TIA, seizures or head injury with loss of consciousness.  He has not had any recent brain imaging done. Update 09/16/2020: He returns for follow-up after last visit 6 months ago.  He is accompanied by his wife.  Patient had lab work at last visit for reversible causes of memory loss which was all normal.  Patient refused to undergo EEG and MRI which were ordered.  He did try the Namenda  but was not able to tolerate 5 mg twice daily and developed significant sleepiness and hence it was stopped after a few weeks.  He continues to have significant memory and cognitive difficulties.  He requires constant supervision and his wife is around him most of the time.  Patient did see Dr. Cabbell for his severe carpal tunnel with he refused to have surgery.  Continues to have numbness and tingling in his hands intermittently but still has good strength.  Patient of likely had an MRI done at the [place] in August 2020 and his wife  states she is trying to get me the report of the disc to review. Update 05/24/2021 : He returns for follow-up after last visit 8 months ago.  Is accompanied by his wife.  Patient continues to have cognitive decline.  He now cannot be left alone.  Patient has benefits through the [place] and now has 20 hours a week of sitter at home with wife gets some relief.  Patient was not able to tolerate Aricept  due to side effects and it was discontinued.  Patient medications are supervised by the wife.  He is confused at baseline and disoriented.   He can follow directions and can be redirected.  He ambulates independently.  There is no delusions hallucinations, unsafe behavior or violent behavior noted.  He had EEG done on 11/12/2020 which was normal.  He was seen briefly in the ER on 01/06/2021 for transient confusion.  He has no new complaints today.  He still has not been complaining of numbness or pain in his hand.  His carpal tunnel symptoms seem to be stable and not bothersome at the moment. Update 09/29/2021 : He returns for follow-up after last visit 4 months ago.  He is accompanied by his wife.  He seems to be tolerating Namenda  better than he did Aricept .  Wife has noticed slight improvement in his cognition and times he speaks better and makes more sense.  Short-term memory also at times seems improved.  Patient continues to talk a lot about his past and has trouble making new information or retaining information.  He is mostly calm though occasionally gets irritated.  He denies any delirium, hallucinations or violent behavior.  He now has a caregiver support at home for 20 hours/week through the [place] system.  His children also help out.  His primary care physician recently started new diabetic medication.  He had lab work done at the [facility] on 08/19/2021 and LDL cholesterol was 103 mg percent and hemoglobin A1c was 7.6. Update 07/05/2022 : He returns for follow-up after last visit 9 months ago.  He is accompanied by his wife who provides most of the history.  Patient continues to have significant cognitive impairment and needs 24-hour supervision.  He is now beginning to forget people's names or he can recognize her faces.  He often forgets his wife's name.  He remains on Namenda  which is tolerating well without side effects.  Patient can get up by himself and go to the restroom is never left alone at home.  Will get respite care few days a week at home.  Patient's wife is the sole caregiver but gets some help from her children.  Patient is  confused off-and-on mostly at night with sundowning.  He gets vivid dreams.  Does not have delusions, hallucinations, agitation.  He has not exhibited violent or unsafe behavior.  Can ambulate safely with good balance and no falls or injuries.  He is complaining of mild posterior neck pain particularly when he turns his neck to the left.  He has mild tenderness of the spine on the left.  He denies any radicular pain and fall injury to the neck.  He was admitted to the hospital on 01/29/2022 for 3 days for NSTEMI and treated medically and his chest pain resolved. Update 06/22/2023 : He returns for follow-up after last visit a year ago.  He is accompanied by his wife.  Continues to have significant cognitive impairment and dementia and requires 24-hour supervision.  He is tolerating Namenda  but has  not switched to the liquid form which is easier for him.  He can ambulate independently with a walker.  He is never left alone.  The wife is getting some help at home and is trying to apply to get more help.  Patient has not had any agitated behavior, delusions or hallucinations.  He is easy to redirect.  Behavior is mostly pleasant and cooperative.  We attempted to do Mini-Mental status exam today but he scored 0.  Unable to name any animals or draw a clock. Update 12/07/2023 : He returns for follow-up after last visit with me 5 months ago.  He is accompanied by his wife.  Patient was admitted o in February 2025 with right pontine infarct.  CT head showed no acute abnormality and MRI showed 16 mm right pontine infarct.  MRA and carotid ultrasound were unremarkable.  Echo showed ejection fraction 60 to 65%.  LDL cholesterol 82 mg percent and hemoglobin A1c was 5.9.  He also had upper GI bleeding but endoscopy was negative.  Patient's wife decided not to start him on any antithrombotics on discharge and wanted more of a palliative care approach.  Patient returned with slurred speech and increased weakness on 09/10/2023 and  bilateral weakness and gait difficulty and trouble using his right arm.  CT head showed no acute abnormality but patient was unable to tolerate an MRI due to agitation but was thought to have a left subcortical infarct since he had some slurred speech and right hand weakness.  CT angiogram showed high-grade stenosis right carotid bulb with dense calcification.  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  Since he had been off apixaban  due to recent GI bleed and may have had another stroke wife this time agreed to put him back on it.  Patient is currently finishing home physical and Occupational Therapy.  He is able to ambulate with a walker but needs to close one-person assist.  He continues to have intermittent mild agitation but can be redirected.  He has good days and bad days.  At times he cannot recognize his wife or family members.  He does have occasional hallucinations.  He sleeps quite well when he takes Namenda  which is tolerating well without side effects.  He has had no further episodes of bleeding or bruising since restarting Eliquis . ROS:   14 system review of systems is positive for memory loss, confusion hand numbness, pain, decreased hearing only and all other systems negative  PMH:  Past Medical History:  Diagnosis Date   Acute encephalopathy 02/12/2018   Alzheimer's dementia (HCC)    CAD (coronary artery disease)    CAP (community acquired pneumonia) 02/11/2018   Carpal tunnel syndrome, bilateral 02/25/2020   Coronary artery disease    Diabetes mellitus    Diverticulitis    Hearing loss    Hypertension    Hypokalemia 02/12/2018   NSTEMI (non-ST elevated myocardial infarction) (HCC)     Social History:  Social History   Socioeconomic History   Marital status: Married    Spouse name: Mariah Shines   Number of children: Not on file   Years of education: Not on file   Highest education level: Not on file  Occupational History   Not on file  Tobacco Use   Smoking status: Never    Smokeless tobacco: Never  Vaping Use   Vaping status: Never Used  Substance and Sexual Activity   Alcohol use: No    Comment: social   Drug use: No   Sexual  activity: Yes    Birth control/protection: None  Other Topics Concern   Not on file  Social History Narrative   Lives with wife   Right Handed   Drinks 1-2 cups caffeine daily   Social Drivers of Health   Financial Resource Strain: Low Risk  (07/02/2023)   Overall Financial Resource Strain (CARDIA)    Difficulty of Paying Living Expenses: Not very hard  Food Insecurity: No Food Insecurity (09/11/2023)   Hunger Vital Sign    Worried About Running Out of Food in the Last Year: Never true    Ran Out of Food in the Last Year: Never true  Transportation Needs: No Transportation Needs (09/11/2023)   PRAPARE - Administrator, Civil Service (Medical): No    Lack of Transportation (Non-Medical): No  Physical Activity: Inactive (07/02/2023)   Exercise Vital Sign    Days of Exercise per Week: 0 days    Minutes of Exercise per Session: 0 min  Stress: Patient Unable To Answer (07/02/2023)   [employer] of Occupational Health - Occupational Stress Questionnaire    Feeling of Stress : Patient unable to answer  Social Connections: Unknown (09/11/2023)   Social Connection and Isolation Panel [NHANES]    Frequency of Communication with Friends and Family: Patient unable to answer    Frequency of Social Gatherings with Friends and Family: Patient unable to answer    Attends Religious Services: Patient unable to answer    Active Member of Clubs or Organizations: Patient unable to answer    Attends Banker Meetings: Patient unable to answer    Marital Status: Married  Recent Concern: Social Connections - Socially Isolated (07/02/2023)   Social Connection and Isolation Panel [NHANES]    Frequency of Communication with Friends and Family: Never    Frequency of Social Gatherings with Friends and Family: Never     Attends Religious Services: Never    Database administrator or Organizations: No    Attends Banker Meetings: Never    Marital Status: Married  Catering manager Violence: Unknown (09/11/2023)   Humiliation, Afraid, Rape, and Kick questionnaire    Fear of Current or Ex-Partner: No    Emotionally Abused: Patient unable to answer    Physically Abused: Patient unable to answer    Sexually Abused: Patient unable to answer    Medications:   Current Outpatient Medications on File Prior to Visit  Medication Sig Dispense Refill   acetaminophen  (TYLENOL ) 325 MG tablet Take 2 tablets (650 mg total) by mouth every 4 (four) hours as needed for mild pain (pain score 1-3) (or temp > 37.5 C (99.5 F)). 20 tablet 0   amiodarone  (PACERONE ) 200 MG tablet Take 1 tablet (200 mg total) by mouth daily. 30 tablet 3   amLODIPine  Benzoate 1 MG/ML SUSP Take 10 mg by mouth 2 (two) times daily.     apixaban  (ELIQUIS ) 5 MG TABS tablet Take 1 tablet (5 mg total) by mouth 2 (two) times daily.     Atorvastatin  Calcium  20 MG/5ML SUSP Take 20 mg by mouth daily.     Memantine  HCl 10 MG/5ML SOLN Take 5 mLs by mouth 2 (two) times daily at 10 AM and 5 PM. (Patient taking differently: Take 10 mg by mouth in the morning and at bedtime.) 300 mL 3   Multiple Vitamin (MULTIVITAMIN) tablet Take 1 tablet by mouth daily with breakfast.     nitroGLYCERIN  (NITROSTAT ) 0.4 MG SL tablet Place 1 tablet (0.4  mg total) under the tongue every 5 (five) minutes x 3 doses as needed for chest pain. 25 tablet 3   polyethylene glycol (MIRALAX  / GLYCOLAX ) 17 g packet Take 17 g by mouth in the morning.     No current facility-administered medications on file prior to visit.    Allergies:   Allergies  Allergen Reactions   Lisinopril Cough   Metformin  Diarrhea    Physical Exam General: well developed, well nourished elderly African-American male, seated, in no evident distress Head: head normocephalic and atraumatic.   Neck: supple  with no carotid or supraclavicular bruits Cardiovascular: regular rate and rhythm, no murmurs Musculoskeletal: no deformity Skin:  no rash/petichiae Vascular:  Normal pulses all extremities  Neurologic Exam Mental Status: Awake and fully alert. Oriented to place and time. Recent and remote memory diminished. Attention span, concentration and fund of knowledge poor. Mood and affect appropriate.  Mini-Mental status exam not completed today. (Last visit 13/30 with deficits in orientation, attention, calculation, recall and following three-step commands.)  Diminished recall 0/3.  Able to name only 0 animals which can walk on 4 legs.  Not cooperative for doing clock drawing or copying descending pentagons. Cranial Nerves: Fundoscopic exam not done pupils equal, briskly reactive to light. Extraocular movements full without nystagmus. Visual fields full to confrontation. Hearing diminished bilaterally facial sensation intact. Face, tongue, palate moves normally and symmetrically.  Motor: Normal bulk and tone. Normal strength in all tested extremity muscles.  Except wasting of both thenar eminences.  Mild weakness of abductor pollicis and extensor pollicis in the right hand. Sensory.:  diminished touch pinprick sensation in the right thumb and index finger only.  Positive weak Tinel sign on both wrists right greater than left.  Diminished vibration sensations over ankles bilaterally.  Romberg sign is weakly positive. Coordination: Rapid alternating movements normal in all extremities. Finger-to-nose and heel-to-shin performed accurately bilaterally. Gait and Station: Arises from chair without difficulty. Stance is normal. Gait demonstrates normal stride length and balance . Able to heel, toe and tandem walk with great difficulty.  Reflexes: 1+ and symmetric in upper extremities and both knee and ankle jerks are absent.. Toes downgoing.        06/22/2023    2:49 PM 03/31/2020    1:27 PM  MMSE - Mini Mental  State Exam  Orientation to time 0 2  Orientation to Place 0 1  Registration 0 3  Attention/ Calculation 0 0  Recall 0 0  Language- name 2 objects 0 2  Language- repeat 0 1  Language- follow 3 step command 0 2  Language- read & follow direction 0 0  Write a sentence 0 1  Copy design 0 1  Copy design-comments  3 animals  Total score 0 13      ASSESSMENT: 89 year old African-American male with bilateral hand right greater than left weakness secondary to severe end-stage carpal tunnel syndrome.  Cervical radiculopathy and underlying diabtic neuropathy felt to be less likely.  Progressive subacute cognitive and memory decline likely due to advanced Alzheimer's .  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  Recent episode of worsening gait and right sided weakness likely due to small left subcortical infarct not visualized on CT in March 2025 and unable to obtain MRI due to his agitation.  He seems to be recovering well with ongoing therapy.    PLAN: I had a long discussion with the patient and  his wife regarding his recent small left subcortical infarct not visualized on CT from which she is doing well with reasonable improvement in his right-sided strength.  I recommend continue Eliquis  for stroke prevention for his A-fib and maintain aggressive risk factor modification strict control of hypertension with blood pressure goal below 140/90 and lipids with LDL cholesterol goal below 70 mg percent.  Continue Namenda  and the current dose of 10 mg twice daily for his severe dementia which appears to have quite progressed.  Continue 24-hour supportive care.  Recommend outpatient physical and Occupational Therapy and to use a walker at all times under close supervision and to avoid falls and injuries.  Return for follow-up in the future only as needed.Aaron Aas  Aaron AasGreater than 50% time during this 35-minute   visit was spent on  counseling and coordination of care about his hand numbness and carpal tunnel as well as memory loss and diagnosis of Alzheimer's and answering questions.   Ardella Beaver, MD  [facility] [address] [place], [place] [address]  Phone [phone] Fax [phone] Note: This document was prepared with digital dictation and possible smart phrase technology. Any transcriptional errors that result from this process are unintentional.

## 2023-12-07 NOTE — Patient Instructions (Signed)
 I had a long discussion with the patient and his wife regarding his recent small left subcortical infarct not visualized on CT from which she is doing well with reasonable improvement in his right-sided strength.  I recommend continue Eliquis  for stroke prevention for his A-fib and maintain aggressive risk factor modification strict control of hypertension with blood pressure goal below 140/90 and lipids with LDL cholesterol goal below 70 mg percent.  Continue Namenda  and the current dose of 10 mg twice daily for his severe dementia which appears to have quite progressed.  Continue 24-hour supportive care.  Recommend outpatient physical and Occupational Therapy and to use a walker at all times under close supervision and to avoid falls and injuries.  Return for follow-up in the future only as needed.

## 2024-01-04 ENCOUNTER — Telehealth: Payer: Self-pay | Admitting: Neurology

## 2024-01-04 NOTE — Telephone Encounter (Signed)
 Patient's Alexander Donaldson called due to have not heard from [facility] to schedule appointments for physical therapy and occupational therapy.  Phone: [phone]

## 2024-01-10 ENCOUNTER — Ambulatory Visit: Attending: Neurology | Admitting: Occupational Therapy

## 2024-01-10 ENCOUNTER — Ambulatory Visit: Admitting: Physical Therapy

## 2024-01-10 ENCOUNTER — Encounter: Payer: Self-pay | Admitting: Physical Therapy

## 2024-01-10 DIAGNOSIS — M6281 Muscle weakness (generalized): Secondary | ICD-10-CM

## 2024-01-10 DIAGNOSIS — R2689 Other abnormalities of gait and mobility: Secondary | ICD-10-CM | POA: Insufficient documentation

## 2024-01-10 DIAGNOSIS — R262 Difficulty in walking, not elsewhere classified: Secondary | ICD-10-CM | POA: Diagnosis not present

## 2024-01-10 DIAGNOSIS — R29898 Other symptoms and signs involving the musculoskeletal system: Secondary | ICD-10-CM | POA: Diagnosis not present

## 2024-01-10 DIAGNOSIS — I6300 Cerebral infarction due to thrombosis of unspecified precerebral artery: Secondary | ICD-10-CM | POA: Diagnosis not present

## 2024-01-10 NOTE — Therapy (Addendum)
 OUTPATIENT OCCUPATIONAL THERAPY NEURO EVALUATION  Patient Name: Alexander Donaldson MRN: [id number] DOB:[birth year], 89 y.o., male Today's Date: 01/10/2024  PCP: [facility] REFERRING PROVIDER: Rosemarie Eather RAMAN, MD  END OF SESSION:  OT End of Session - 01/10/24 1750     Visit Number 1    Number of Visits 24    Date for OT Re-Evaluation 04/03/24    OT Start Time 1400    OT Stop Time 1445    OT Time Calculation (min) 45 min    Activity Tolerance Patient tolerated treatment well    Behavior During Therapy [facility] for tasks assessed/performed          Past Medical History:  Diagnosis Date   Acute encephalopathy 02/12/2018   Alzheimer's dementia (HCC)    CAD (coronary artery disease)    CAP (community acquired pneumonia) 02/11/2018   Carpal tunnel syndrome, bilateral 02/25/2020   Coronary artery disease    Diabetes mellitus    Diverticulitis    Hearing loss    Hypertension    Hypokalemia 02/12/2018   NSTEMI (non-ST elevated myocardial infarction) [facility])    Past Surgical History:  Procedure Laterality Date   ANGIOPLASTY     CORONARY STENT INTERVENTION N/A 02/02/2017   Procedure: Coronary Stent Intervention;  Surgeon: Levern Hutching, MD;  Location: [facility] INVASIVE CV LAB;  Service: Cardiovascular;  Laterality: N/A;   CORONARY STENTS     ESOPHAGOGASTRODUODENOSCOPY (EGD) WITH PROPOFOL  N/A 09/08/2023   Procedure: ESOPHAGOGASTRODUODENOSCOPY (EGD) WITH PROPOFOL ;  Surgeon: Legrand Victory LITTIE DOUGLAS, MD;  Location: [facility] ENDOSCOPY;  Service: Gastroenterology;  Laterality: N/A;   LEFT HEART CATH AND CORONARY ANGIOGRAPHY N/A 02/02/2017   Procedure: Left Heart Cath and Coronary Angiography;  Surgeon: Levern Hutching, MD;  Location: [facility] INVASIVE CV LAB;  Service: Cardiovascular;  Laterality: N/A;   LEFT HEART CATHETERIZATION WITH CORONARY ANGIOGRAM N/A 03/16/2012   Procedure: LEFT HEART CATHETERIZATION WITH CORONARY ANGIOGRAM;  Surgeon: Hutching LOISE Levern, MD;  Location: [facility] CATH LAB;  Service:  Cardiovascular;  Laterality: N/A;   PERCUTANEOUS CORONARY STENT INTERVENTION (PCI-S)  03/16/2012   Procedure: PERCUTANEOUS CORONARY STENT INTERVENTION (PCI-S);  Surgeon: Hutching LOISE Levern, MD;  Location: [facility] CATH LAB;  Service: Cardiovascular;;   TONSILLECTOMY     Patient Active Problem List   Diagnosis Date Noted   Palliative care by specialist 09/11/2023   DNR (do not resuscitate) 09/11/2023   CVA (cerebral vascular accident) (HCC) 09/10/2023   History of CVA 09/04/2023 (cerebrovascular accident) 09/10/2023   History of GI bleed 09/10/2023   Acute CVA (cerebrovascular accident) (HCC) 09/10/2023   Iron  deficiency anemia due to chronic blood loss 09/07/2023   Heme positive stool 09/07/2023   TIA (transient ischemic attack) 09/04/2023   Acute upper GI bleed 09/04/2023   Acute blood loss anemia 09/04/2023   Facial droop 09/04/2023   Weakness of right upper extremity 09/04/2023   Acute kidney injury superimposed on stage 3b chronic kidney disease (HCC) 09/04/2023   Paroxysmal atrial fibrillation (HCC) 09/04/2023   Chronic diastolic CHF (congestive heart failure) (HCC) 09/04/2023   Atrial fibrillation with RVR (HCC) 07/02/2023   NSTEMI, initial episode of care (HCC) 07/02/2023   Exposure to COVID-19 virus 03/23/2023   Dysphagia, unspecified 02/01/2023   Goals of care, counseling/discussion 02/01/2023   Arthralgia 07/05/2022   NSTEMI (non-ST elevated myocardial infarction) (HCC)    Accelerated junctional rhythm 01/30/2022   Dementia without behavioral disturbance (HCC) 01/30/2022   Carpal tunnel syndrome, bilateral 02/25/2020   Numbness 11/28/2019   Raynaud's phenomenon  10/30/2019   CAD (coronary artery disease) 02/12/2018   Left foot drop 02/12/2018   Non-insulin  dependent type 2 diabetes mellitus (HCC) 02/12/2018   Essential hypertension 02/12/2018   Hypokalemia 02/12/2018    ONSET DATE: 09/10/2023  REFERRING DIAG: LCVA MCA  THERAPY DIAG:  Muscle weakness  (generalized)  Rationale for Evaluation and Treatment: Rehabilitation  SUBJECTIVE:   SUBJECTIVE STATEMENT:  Pt. Caregiver reports that Pt. Does not initiate tasks, and often needs cuing to complete a task. Pt accompanied by: self and significant other  PERTINENT HISTORY: Pt. Had recent onset of L CVA MCA due to Afib. Pt. PMHx includes HTN, CKD IIIb, heart failure, dementia, and an Ischemic Stroke.   PRECAUTIONS: None  WEIGHT BEARING RESTRICTIONS: No  PAIN:  Are you having pain? No  FALLS: Has patient fallen in last 6 months? No  LIVING ENVIRONMENT: Lives with: lives with their family and lives with their spouse Lives in: House/apartment, 1 level Stairs:  Yes, but uses ramp. Has following equipment at home: Vannie, rollator, w/c to go out into community, shower chair, grab bars, raised toilet seat  PLOF: Independent  PATIENT GOALS: Strengthen RUE  OBJECTIVE:  Note: Objective measures were completed at Evaluation unless otherwise noted.  HAND DOMINANCE: Right  ADLs: Overall ADLs: Pt. Is unable to initiate ADL tasks.  Transfers/ambulation related to ADLs: uses w/c when wife knows they will be out long.  Eating: Requires Set Up assistance in the initiation of feeding, and requires assistance from caregivers to feed him. Able to feed himself, however, R hand pronates more due to weakness.  Grooming: Wife takes dentures and wife cleans them. Does not initiate task.  UB Dressing: Requires assistance to complete ADL tasks.  LB Dressing: Is able to put shoes on, does not typically initiate task. Uses slip on sneakers, and is able to push foot through.  Toileting: requires 2+ assistance to complete toileting tasks.  Bathing: attempts to wash his face and body Tub Shower transfers: Uses walk in shower and shower chair.  Equipment: Shower seat with back, Grab bars, and bed side commode Work: used to work as Curator Hobbies: singing IADLs: Shopping: Wife typically does the  shopping Light housekeeping: Wife typically does this task. Meal Prep: Public house manager mobility:  Medication management: Wife takes care of Meds Financial management:  Handwriting: TBD  MOBILITY STATUS: Pt. Caregiver states that if they go out for a long outing, Pt. Will use w/c to get around stores.   POSTURE COMMENTS:  No Significant postural limitations Sitting balance: Good  ACTIVITY TOLERANCE: Activity tolerance:   FUNCTIONAL OUTCOME MEASURES:   UPPER EXTREMITY ROM:    Active ROM Right eval Left eval  Shoulder flexion 92(105) 90 (130)  Shoulder abduction 78(110) 80(94)  Shoulder adduction    Shoulder extension    Shoulder internal rotation    Shoulder external rotation    Elbow flexion 132(142) 132(140)  Elbow extension -25(-16) -10 (-5)  Wrist flexion 40 (58) 10(48)  Wrist extension 30(38) 12(38)  Wrist ulnar deviation    Wrist radial deviation    Wrist pronation    Wrist supination    (Blank rows = not tested)  UPPER EXTREMITY MMT:   TBD  MMT Right eval Left eval  Shoulder flexion    Shoulder abduction    Shoulder adduction    Shoulder extension    Shoulder internal rotation    Shoulder external rotation    Middle trapezius    Lower trapezius    Elbow flexion  Elbow extension    Wrist flexion    Wrist extension    Wrist ulnar deviation    Wrist radial deviation    Wrist pronation    Wrist supination    (Blank rows = not tested)  HAND FUNCTION: TBD   COORDINATION: TBD   SENSATION:   EDEMA:   MUSCLE TONE:   COGNITION: Overall cognitive status: Impaired  VISION: Subjective report: Pt. Caregiver reports changes in vision since recent L MCA CVA. Baseline vision: Wears glasses all the time Visual history:   VISION ASSESSMENT: Not tested  Patient has difficulty with following activities due to following visual impairments:     PRAXIS: Impaired: Initiation, motor planning                                                                                                                                TREATMENT DATE: 01/10/2024    Evaluation completed and education provided as indicated below.   PATIENT EDUCATION: Education details: POC, ROM, grip/pinch strength, and  Person educated: Patient and Caregiver [name] Education method: Explanation, Demonstration, Tactile cues, and Verbal cues Education comprehension: verbalized understanding, returned demonstration, verbal cues required, tactile cues required, and needs further education  HOME EXERCISE PROGRAM:    GOALS: Goals reviewed with patient? Yes  SHORT TERM GOALS: Target date: 02/21/2024     Pt. Will perform HEPs with  Mod A and Mod cuing. Baseline: Eval: no current HEP Goal status: INITIAL   LONG TERM GOALS: Target date: 04/03/2024    Pt. Will complete shaving using his R hand with Supervision with Min cuing using an Neurosurgeon. Baseline:  Eval: Max A and Max cues to complete shaving. Goal status: INITIAL  2.  Pt. Will use his R hand to wash his face with Supervision with Min cuing to initiate the task. Baseline:  Eval: Max A and Max cues to wash face. Goal status: INITIAL  3.  Pt. Will initiate using his R hand to flip through a magazine independently with Min cues.  Baseline: Eval: Max A and Max cues to initiate using his R hand.  Goal status: INITIAL  4.  Pt. Will increase BUE shoulder ROM by 10 degrees to improve functional reaching tasks to elevated surfaces.  Baseline: Eval: Shoulder Felxion: R: 92(105), L: 90(130), Abduction: R: 78(110), L: 80(94) Goal status: INITIAL  5.  Pt. Will improve R hand [place] skills to be able to independently manipulate small objects during ADLs/IADLs. Baseline: Eval: Pt. Has difficulty using the R hand to manipulate small objects during daily care tasks. Goal status: INITIAL    ASSESSMENT:  CLINICAL IMPRESSION:  Patient is a 89 y.o. male who was seen today for occupational  therapy evaluation for L CVA MCA. Pt. Presents with  a history of cognitive impairments, limited BUE shoulder ROM, impaired [facility] skills, and impaired initiation with the RUE which hinders his ability to perform ADL/IADL tasks.  Pt. has difficulty with initiating  ADL/IADL tasks, such as washing and shaving his face, requiring Max A and Max cues. Pt. has difficulty with initiating ADLs/IADLs using his R hand. Pt. requires Max vc, visual demonstration, and tactile cues to perform ROM. Pt. will benefit from OT services to work on initiating desired occupations, increasing [facility] skills and BUE shoulder ROM while performing ADL/IADL tasks.   PERFORMANCE DEFICITS: in functional skills including ADLs, IADLs, coordination, dexterity, ROM, strength, pain, Fine motor control, Gross motor control, vision, and UE functional use, cognitive skills including attention, problem solving, safety awareness, sequencing, thought, and understand, and psychosocial skills including environmental adaptation, habits, interpersonal interactions, and routines and behaviors.   IMPAIRMENTS: are limiting patient from ADLs, IADLs, leisure, and social participation.   CO-MORBIDITIES: may have co-morbidities  that affects occupational performance. Patient will benefit from skilled OT to address above impairments and improve overall function.  MODIFICATION OR ASSISTANCE TO COMPLETE EVALUATION: Min-Moderate modification of tasks or assist with assess necessary to complete an evaluation.  OT OCCUPATIONAL PROFILE AND HISTORY: Detailed assessment: Review of records and additional review of physical, cognitive, psychosocial history related to current functional performance.  CLINICAL DECISION MAKING: Moderate - several treatment options, min-mod task modification necessary  REHAB POTENTIAL: Good  EVALUATION COMPLEXITY: Moderate    PLAN:  OT FREQUENCY: 2x/week  OT DURATION: 12 weeks  PLANNED INTERVENTIONS: 97168 OT Re-evaluation,  97535 self care/ADL training, 02889 therapeutic exercise, 97530 therapeutic activity, 97112 neuromuscular re-education, 97140 manual therapy, passive range of motion, visual/perceptual remediation/compensation, patient/family education, and DME and/or AE instructions  RECOMMENDED OTHER SERVICES: ST & PT  CONSULTED AND AGREED WITH PLAN OF CARE: Patient and family member/caregiver  PLAN FOR NEXT SESSION: Treatment  Elaine Jagentenfl, MS, OTR/L   This entire session was performed under direct supervision and direction of a licensed therapist/therapist assistant . I have personally read, edited and approve of the note as written.  Damien Nap, Student-OT 01/10/2024, 5:52 PM

## 2024-01-10 NOTE — Therapy (Signed)
 OUTPATIENT PHYSICAL THERAPY NEURO EVALUATION   Patient Name: Alexander Donaldson MRN: [id number] DOB:Jan 23, 1934, 89 y.o., male Today's Date: 01/10/2024   PCP: [facility]  REFERRING PROVIDER: Eather Popp, MD  END OF SESSION:  PT End of Session - 01/10/24 1355     Visit Number 1    Number of Visits 24    Date for PT Re-Evaluation 04/03/24    PT Start Time 1445    PT Stop Time 1530    PT Time Calculation (min) 45 min    Equipment Utilized During Treatment Gait belt    Activity Tolerance Patient tolerated treatment well    Behavior During Therapy [facility] for tasks assessed/performed          Past Medical History:  Diagnosis Date   Acute encephalopathy 02/12/2018   Alzheimer's dementia (HCC)    CAD (coronary artery disease)    CAP (community acquired pneumonia) 02/11/2018   Carpal tunnel syndrome, bilateral 02/25/2020   Coronary artery disease    Diabetes mellitus    Diverticulitis    Hearing loss    Hypertension    Hypokalemia 02/12/2018   NSTEMI (non-ST elevated myocardial infarction) [facility])    Past Surgical History:  Procedure Laterality Date   ANGIOPLASTY     CORONARY STENT INTERVENTION N/A 02/02/2017   Procedure: Coronary Stent Intervention;  Surgeon: Levern Hutching, MD;  Location: [facility] INVASIVE CV LAB;  Service: Cardiovascular;  Laterality: N/A;   CORONARY STENTS     ESOPHAGOGASTRODUODENOSCOPY (EGD) WITH PROPOFOL  N/A 09/08/2023   Procedure: ESOPHAGOGASTRODUODENOSCOPY (EGD) WITH PROPOFOL ;  Surgeon: Legrand Victory LITTIE DOUGLAS, MD;  Location: [facility] ENDOSCOPY;  Service: Gastroenterology;  Laterality: N/A;   LEFT HEART CATH AND CORONARY ANGIOGRAPHY N/A 02/02/2017   Procedure: Left Heart Cath and Coronary Angiography;  Surgeon: Levern Hutching, MD;  Location: [facility] INVASIVE CV LAB;  Service: Cardiovascular;  Laterality: N/A;   LEFT HEART CATHETERIZATION WITH CORONARY ANGIOGRAM N/A 03/16/2012   Procedure: LEFT HEART CATHETERIZATION WITH CORONARY ANGIOGRAM;  Surgeon: Hutching LOISE Levern, MD;   Location: [facility] CATH LAB;  Service: Cardiovascular;  Laterality: N/A;   PERCUTANEOUS CORONARY STENT INTERVENTION (PCI-S)  03/16/2012   Procedure: PERCUTANEOUS CORONARY STENT INTERVENTION (PCI-S);  Surgeon: Hutching LOISE Levern, MD;  Location: [facility] CATH LAB;  Service: Cardiovascular;;   TONSILLECTOMY     Patient Active Problem List   Diagnosis Date Noted   Palliative care by specialist 09/11/2023   DNR (do not resuscitate) 09/11/2023   CVA (cerebral vascular accident) (HCC) 09/10/2023   History of CVA 09/04/2023 (cerebrovascular accident) 09/10/2023   History of GI bleed 09/10/2023   Acute CVA (cerebrovascular accident) (HCC) 09/10/2023   Iron  deficiency anemia due to chronic blood loss 09/07/2023   Heme positive stool 09/07/2023   TIA (transient ischemic attack) 09/04/2023   Acute upper GI bleed 09/04/2023   Acute blood loss anemia 09/04/2023   Facial droop 09/04/2023   Weakness of right upper extremity 09/04/2023   Acute kidney injury superimposed on stage 3b chronic kidney disease (HCC) 09/04/2023   Paroxysmal atrial fibrillation (HCC) 09/04/2023   Chronic diastolic CHF (congestive heart failure) (HCC) 09/04/2023   Atrial fibrillation with RVR (HCC) 07/02/2023   NSTEMI, initial episode of care (HCC) 07/02/2023   Exposure to COVID-19 virus 03/23/2023   Dysphagia, unspecified 02/01/2023   Goals of care, counseling/discussion 02/01/2023   Arthralgia 07/05/2022   NSTEMI (non-ST elevated myocardial infarction) (HCC)    Accelerated junctional rhythm 01/30/2022   Dementia without behavioral disturbance (HCC) 01/30/2022   Carpal tunnel  syndrome, bilateral 02/25/2020   Numbness 11/28/2019   Raynaud's phenomenon 10/30/2019   CAD (coronary artery disease) 02/12/2018   Left foot drop 02/12/2018   Non-insulin  dependent type 2 diabetes mellitus (HCC) 02/12/2018   Essential hypertension 02/12/2018   Hypokalemia 02/12/2018    ONSET DATE: 2020   REFERRING DIAG:  I63.00 (ICD-10-CM) - Cerebrovascular  accident (CVA) due to thrombosis of precerebral artery (HCC)  R29.898 (ICD-10-CM) - Right hand weakness    THERAPY DIAG:  Other abnormalities of gait and mobility  Difficulty in walking, not elsewhere classified  Muscle weakness (generalized)  Impaired ambulation  Rationale for Evaluation and Treatment: Rehabilitation  SUBJECTIVE:                                                                                                                                                                                             SUBJECTIVE STATEMENT: Pt presents to clinic in transport chair accompanied by his wife. They say that this decline started in 2020 after a visit to the [place] where he was diagnosed with vascular dementia. Wife reports that pt was independent with ADL for four years until 2024, when he started needing assitance for most activities of daily living d/t cognitive decline. His stroke 3 months ago further exacerbated his B UE numbness and LE weakness, although his wife reports that his LE is now not as weak. Pt cognition is significantly impaired, needing constant redirection and simple instructions. Pt previously used SPC at home until 2024, and has now transitioned to a 2WW. Wife reports she has cameras in her home to monitor patient in case of falls. Wife reported severe musculature atrophy of thenar eminence.  Pt accompanied by: significant other  PERTINENT HISTORY: Cerebral infarction, unspecified (HCC)  Hypertensive heart and chronic kidney disease with heart failure and stage 1 through stage 4 chronic kidney disease, or unspecified chronic kidney disease (HCC)  Gastrointestinal hemorrhage, unspecified  Chronic kidney disease, unspecified  Paroxysmal atrial fibrillation (HCC)  Hypokalemia  Chronic combined systolic (congestive) and diastolic (congestive) heart failure (HCC)  Type 2 diabetes mellitus with diabetic chronic kidney disease (HCC)  PAIN:  Are you having pain?  No  PRECAUTIONS: Fall  RED FLAGS: None   WEIGHT BEARING RESTRICTIONS: No  FALLS: Has patient fallen in last 6 months? No  LIVING ENVIRONMENT: Lives with: lives with their family and lives with their spouse Lives in: House/apartment Stairs: ramp Has following equipment at home: Vannie - 2 wheeled  PLOF: Independent  PATIENT GOALS: Walking around the house stronger   OBJECTIVE:  Note: Objective measures were completed at Evaluation unless otherwise noted.  DIAGNOSTIC FINDINGS:  Per Ct Head code stroke without contrast on 09/10/23:   The study is moderately degraded by patient motion. The right paramedian pontine infarct is noted.   Moderate generalized atrophy and white matter disease is otherwise stable. The ventricles are proportionate to the degree of atrophy. Chronic encephalomalacia is again noted at the left temporal tip.   The brainstem and cerebellum are otherwise within normal limits. Midline structures are within normal limits.  Vascular: Atherosclerotic calcifications are present within the cavernous internal carotid arteries and at the dural margin of both vertebral arteries. No hyperdense vessel is present.   Skull: Calvarium is intact. No focal lytic or blastic lesions are present. No significant extracranial soft tissue lesion is present.   Sinuses/Orbits: The paranasal sinuses and mastoid air cells are clear. The globes and orbits are within normal limits.  COGNITION: Overall cognitive status: Impaired     POSTURE: rounded shoulders, forward head, increased thoracic kyphosis, anterior pelvic tilt, and flexed trunk   LOWER EXTREMITY ROM:     WFL   LOWER EXTREMITY MMT:    B hip flexors and knee extensors were observed to move within functional range. Formal MMTs deferred d/t patient not understanding test parameters.    GAIT: Findings: Gait Characteristics: decreased hip/knee flexion- Right, decreased hip/knee flexion- Left, shuffling,  trunk flexed, poor foot clearance- Right, and poor foot clearance- Left, Distance walked: 60ft, Assistive device utilized:Walker - 2 wheeled, Level of assistance: CGA, and Comments: CGA-MinA  FUNCTIONAL TESTS:  5 times sit to stand: 40 seconds - Heavy  B UE use  Timed up and go (TUG): 70 seconds  10 meter walk test: 40s  PATIENT SURVEYS:   SIS16 - 29                                                                                                                               TREATMENT DATE: 01/10/24  Physical therapy treatment session today consisted of administration of testing and subjective history taking as demonstrated and documented in flow sheet, treatment, and goals section of this note. Addition treatments may be found below.      10 Meter Walk Test: Patient instructed to walk 10 meters (32.8 ft) as quickly and as safely as possible at their normal speed Results: 0.25 m/s (40 seconds )  Cut off scores:   Household Ambulator  < 0.4 m/s  Limited Community Ambulator  0.4 - 0.8 m/s  [employer]  > 0.8 m/s  Increased fall risk  < 1.70m/s  Crossing a Street  >1.36m/s  MCID 0.05 m/s (small), 0.13 m/s (moderate), 0.06 m/s (significant)  (ANPTA Core Set of Outcome Measures for Adults with Neurologic Conditions, 2018)    Five times Sit to Stand Test (FTSS)  TIME: 40 sec  Cut off scores indicative of increased fall risk: >12 sec CVA, >16 sec PD, >13 sec vestibular (ANPTA Core Set of Outcome Measures for Adults with Neurologic Conditions, 2018)   PT  instructed pt in TUG: 70 sec ( >13.5 sec indicates increased fall risk)    01/10/24   SELF CARE Patient instructed in plan of care, findings for evaluation, and ways of physical therapy may improve their function and quality of life.     PATIENT EDUCATION: Education details: POC Person educated: Patient Education method: Explanation Education comprehension: verbalized understanding   HOME EXERCISE  PROGRAM: Establish visit 2    GOALS: Goals reviewed with patient? Yes  SHORT TERM GOALS: Target date: 04/03/2024    Pt will show proficiency with HEP to maintain progress made in PT and increase overall QoL Baseline: Not yet given Goal status: INITIAL    LONG TERM GOALS: Target date: 04/03/2024    Pt will increase his SIS-16 score by 10 points to increase overall QoL as it relates to his CVA. Baseline: 29 Goal status: INITIAL  2.  Pt will decrease TUG time 30 sec or less without deviating from TUG pathway  to indicate reduced fall risk.  Baseline: 70 seconds  Goal status: INITIAL  3.  Pt will decrease 5xSTS time by 15 seconds to improve strength and power and increase overall QoL.  Baseline: 40 seconds  Goal status: INITIAL  4.  Pt will increase his speed on the by 0.25 m/s to decrease his risk of falls and improve his QoL as a limited community Ambulator.  Baseline: 0.25 m/s  Goal status: INITIAL   ASSESSMENT:  CLINICAL IMPRESSION:  Patient is a 89 y.o. Male who was seen today for physical therapy evaluation and treatment for CVA and B UE numbness. Pt presented to clinic in transport chair accompanied by his wife. Pt is pleasantly confused and requires extra time for all tasks. His dementia is the main limiting factor in his care, as he does not seem to understand or carryover much information given to him. Pt will benefit from concise instructions and direct, but friendly instructions. Pt's scores on his functional testing were all significantly affected by his dementia, as his ability to understand what he needs to do led to many pauses and breaks during testing, however, based on testing and presentation pt is at high risk of falls, particularly with transfers and unassisted gait. Pt is ambulates slowly and has difficulty with turns and changing directions when ambulating. Pt will continue to benefit from skilled physical therapy intervention to address impairments,  improve QOL, and attain therapy goals.    OBJECTIVE IMPAIRMENTS: Abnormal gait, decreased activity tolerance, decreased cognition, decreased endurance, decreased mobility, difficulty walking, decreased strength, impaired UE functional use, and improper body mechanics.   ACTIVITY LIMITATIONS: carrying, lifting, squatting, stairs, transfers, and bed mobility  PARTICIPATION LIMITATIONS: meal prep, cleaning, laundry, medication management, personal finances, interpersonal relationship, driving, shopping, community activity, and occupation  PERSONAL FACTORS: Age are also affecting patient's functional outcome.   REHAB POTENTIAL: Good  CLINICAL DECISION MAKING: Evolving/moderate complexity  EVALUATION COMPLEXITY: Moderate  PLAN:  PT FREQUENCY: 2x/week  PT DURATION: 12 weeks  PLANNED INTERVENTIONS: 97110-Therapeutic exercises, 97530- Therapeutic activity, 97112- Neuromuscular re-education, 97535- Self Care, and [id number]- Manual therapy  PLAN FOR NEXT SESSION: , initiate HEP, strength and gait training.    Lonni KATHEE Gainer, PT 01/10/2024, 4:32 PM

## 2024-01-16 ENCOUNTER — Ambulatory Visit

## 2024-01-18 ENCOUNTER — Ambulatory Visit: Admitting: Physical Therapy

## 2024-01-18 DIAGNOSIS — I6300 Cerebral infarction due to thrombosis of unspecified precerebral artery: Secondary | ICD-10-CM | POA: Diagnosis not present

## 2024-01-18 DIAGNOSIS — R262 Difficulty in walking, not elsewhere classified: Secondary | ICD-10-CM | POA: Diagnosis not present

## 2024-01-18 DIAGNOSIS — M6281 Muscle weakness (generalized): Secondary | ICD-10-CM | POA: Diagnosis not present

## 2024-01-18 DIAGNOSIS — R2689 Other abnormalities of gait and mobility: Secondary | ICD-10-CM

## 2024-01-18 DIAGNOSIS — R29898 Other symptoms and signs involving the musculoskeletal system: Secondary | ICD-10-CM | POA: Diagnosis not present

## 2024-01-18 NOTE — Therapy (Signed)
 OUTPATIENT PHYSICAL THERAPY NEURO TREATMENT   Patient Name: Alexander Donaldson MRN: [id number] DOB:1933-12-06, 89 y.o., male Today's Date: 01/18/2024   PCP: [facility]  REFERRING PROVIDER: Eather Popp, MD  END OF SESSION:  PT End of Session - 01/18/24 1609     Visit Number 2    Number of Visits 24    Date for PT Re-Evaluation 04/03/24    Progress Note Due on Visit 10    PT Start Time 1617    PT Stop Time 1649    PT Time Calculation (min) 32 min    Equipment Utilized During Treatment Gait belt    Activity Tolerance Patient tolerated treatment well    Behavior During Therapy [facility] for tasks assessed/performed           Past Medical History:  Diagnosis Date   Acute encephalopathy 02/12/2018   Alzheimer's dementia (HCC)    CAD (coronary artery disease)    CAP (community acquired pneumonia) 02/11/2018   Carpal tunnel syndrome, bilateral 02/25/2020   Coronary artery disease    Diabetes mellitus    Diverticulitis    Hearing loss    Hypertension    Hypokalemia 02/12/2018   NSTEMI (non-ST elevated myocardial infarction) [facility])    Past Surgical History:  Procedure Laterality Date   ANGIOPLASTY     CORONARY STENT INTERVENTION N/A 02/02/2017   Procedure: Coronary Stent Intervention;  Surgeon: Levern Hutching, MD;  Location: [facility] INVASIVE CV LAB;  Service: Cardiovascular;  Laterality: N/A;   CORONARY STENTS     ESOPHAGOGASTRODUODENOSCOPY (EGD) WITH PROPOFOL  N/A 09/08/2023   Procedure: ESOPHAGOGASTRODUODENOSCOPY (EGD) WITH PROPOFOL ;  Surgeon: Legrand Victory LITTIE DOUGLAS, MD;  Location: [facility] ENDOSCOPY;  Service: Gastroenterology;  Laterality: N/A;   LEFT HEART CATH AND CORONARY ANGIOGRAPHY N/A 02/02/2017   Procedure: Left Heart Cath and Coronary Angiography;  Surgeon: Levern Hutching, MD;  Location: [facility] INVASIVE CV LAB;  Service: Cardiovascular;  Laterality: N/A;   LEFT HEART CATHETERIZATION WITH CORONARY ANGIOGRAM N/A 03/16/2012   Procedure: LEFT HEART CATHETERIZATION WITH CORONARY  ANGIOGRAM;  Surgeon: Hutching LOISE Levern, MD;  Location: [facility] CATH LAB;  Service: Cardiovascular;  Laterality: N/A;   PERCUTANEOUS CORONARY STENT INTERVENTION (PCI-S)  03/16/2012   Procedure: PERCUTANEOUS CORONARY STENT INTERVENTION (PCI-S);  Surgeon: Hutching LOISE Levern, MD;  Location: [facility] CATH LAB;  Service: Cardiovascular;;   TONSILLECTOMY     Patient Active Problem List   Diagnosis Date Noted   Palliative care by specialist 09/11/2023   DNR (do not resuscitate) 09/11/2023   CVA (cerebral vascular accident) (HCC) 09/10/2023   History of CVA 09/04/2023 (cerebrovascular accident) 09/10/2023   History of GI bleed 09/10/2023   Acute CVA (cerebrovascular accident) (HCC) 09/10/2023   Iron  deficiency anemia due to chronic blood loss 09/07/2023   Heme positive stool 09/07/2023   TIA (transient ischemic attack) 09/04/2023   Acute upper GI bleed 09/04/2023   Acute blood loss anemia 09/04/2023   Facial droop 09/04/2023   Weakness of right upper extremity 09/04/2023   Acute kidney injury superimposed on stage 3b chronic kidney disease (HCC) 09/04/2023   Paroxysmal atrial fibrillation (HCC) 09/04/2023   Chronic diastolic CHF (congestive heart failure) (HCC) 09/04/2023   Atrial fibrillation with RVR (HCC) 07/02/2023   NSTEMI, initial episode of care (HCC) 07/02/2023   Exposure to COVID-19 virus 03/23/2023   Dysphagia, unspecified 02/01/2023   Goals of care, counseling/discussion 02/01/2023   Arthralgia 07/05/2022   NSTEMI (non-ST elevated myocardial infarction) (HCC)    Accelerated junctional rhythm 01/30/2022  Dementia without behavioral disturbance (HCC) 01/30/2022   Carpal tunnel syndrome, bilateral 02/25/2020   Numbness 11/28/2019   Raynaud's phenomenon 10/30/2019   CAD (coronary artery disease) 02/12/2018   Left foot drop 02/12/2018   Non-insulin  dependent type 2 diabetes mellitus (HCC) 02/12/2018   Essential hypertension 02/12/2018   Hypokalemia 02/12/2018    ONSET DATE: 2020   REFERRING  DIAG:  I63.00 (ICD-10-CM) - Cerebrovascular accident (CVA) due to thrombosis of precerebral artery (HCC)  R29.898 (ICD-10-CM) - Right hand weakness    THERAPY DIAG:  Other abnormalities of gait and mobility  Difficulty in walking, not elsewhere classified  Impaired ambulation  Rationale for Evaluation and Treatment: Rehabilitation  SUBJECTIVE:                                                                                                                                                                                             SUBJECTIVE STATEMENT: 01/18/24: Pt reports no changes since last session. Pt still slow getting up from a chair.    From Eval: Pt presents to clinic in transport chair accompanied by his wife. They say that this decline started in 2020 after a visit to the [place] where he was diagnosed with vascular dementia. Wife reports that pt was independent with ADL for four years until 2024, when he started needing assitance for most activities of daily living d/t cognitive decline. His stroke 3 months ago further exacerbated his B UE numbness and LE weakness, although his wife reports that his LE is now not as weak. Pt cognition is significantly impaired, needing constant redirection and simple instructions. Pt previously used SPC at home until 2024, and has now transitioned to a 2WW. Wife reports she has cameras in her home to monitor patient in case of falls. Wife reported severe musculature atrophy of thenar eminence.  Pt accompanied by: significant other  PERTINENT HISTORY: Cerebral infarction, unspecified (HCC)  Hypertensive heart and chronic kidney disease with heart failure and stage 1 through stage 4 chronic kidney disease, or unspecified chronic kidney disease (HCC)  Gastrointestinal hemorrhage, unspecified  Chronic kidney disease, unspecified  Paroxysmal atrial fibrillation (HCC)  Hypokalemia  Chronic combined systolic (congestive) and diastolic (congestive)  heart failure (HCC)  Type 2 diabetes mellitus with diabetic chronic kidney disease (HCC)  PAIN:  Are you having pain? No  PRECAUTIONS: Fall  RED FLAGS: None   WEIGHT BEARING RESTRICTIONS: No  FALLS: Has patient fallen in last 6 months? No  LIVING ENVIRONMENT: Lives with: lives with their family and lives with their spouse Lives in: House/apartment Stairs: ramp Has following equipment at home: Vannie - 2 wheeled  PLOF:  Independent  PATIENT GOALS: Walking around the house stronger   OBJECTIVE:  Note: Objective measures were completed at Evaluation unless otherwise noted.  DIAGNOSTIC FINDINGS:   Per Ct Head code stroke without contrast on 09/10/23:   The study is moderately degraded by patient motion. The right paramedian pontine infarct is noted.   Moderate generalized atrophy and white matter disease is otherwise stable. The ventricles are proportionate to the degree of atrophy. Chronic encephalomalacia is again noted at the left temporal tip.   The brainstem and cerebellum are otherwise within normal limits. Midline structures are within normal limits.  Vascular: Atherosclerotic calcifications are present within the cavernous internal carotid arteries and at the dural margin of both vertebral arteries. No hyperdense vessel is present.   Skull: Calvarium is intact. No focal lytic or blastic lesions are present. No significant extracranial soft tissue lesion is present.   Sinuses/Orbits: The paranasal sinuses and mastoid air cells are clear. The globes and orbits are within normal limits.  COGNITION: Overall cognitive status: Impaired     POSTURE: rounded shoulders, forward head, increased thoracic kyphosis, anterior pelvic tilt, and flexed trunk   LOWER EXTREMITY ROM:     WFL   LOWER EXTREMITY MMT:    B hip flexors and knee extensors were observed to move within functional range. Formal MMTs deferred d/t patient not understanding test parameters.     GAIT: Findings: Gait Characteristics: decreased hip/knee flexion- Right, decreased hip/knee flexion- Left, shuffling, trunk flexed, poor foot clearance- Right, and poor foot clearance- Left, Distance walked: 51ft, Assistive device utilized:Walker - 2 wheeled, Level of assistance: CGA, and Comments: CGA-MinA  FUNCTIONAL TESTS:  5 times sit to stand: 40 seconds - Heavy  B UE use  Timed up and go (TUG): 70 seconds  10 meter walk test: 40s  PATIENT SURVEYS:   SIS16 - 29                                                                                                                               TREATMENT DATE: 01/18/24  PHYSICAL PERFORMANCE  6 Min Walk Test:   Instructed patient to ambulate as quickly and as safely as possible for 6 minutes using LRAD. Patient was allowed to take standing rest breaks without stopping the test, but if the patient required a sitting rest break the clock would be stopped and the test would be over.  Results: 479 feet using a RW with SBA, pt ambulates with walker too far ant throughout which impacts step length and gait speed. Results indicate that the patient has reduced endurance with ambulation compared to age matched norms.  Age Matched Norms (in meters): 68-69 yo M: 83 F: 75, 45-79 yo M: 67 F: 471, 23-89 yo M: 417 F: 392 MDC: 58.21 meters (190.98 feet) or 50 meters (ANPTA Core Set of Outcome Measures for Adults with Neurologic Conditions, 2018)   TE/ self care- To improve strength, endurance, mobility, and function of  specific targeted muscle groups or improve joint range of motion or improve muscle flexibility   STS x 5 reps on 5 th rep pt refused to complete, started mumbling incomprehensible excuses regarding his hands and pointing to various parts of the clinic requesting the PT to  go work with them and  saying  why do I need to do this . Pt mostly mumbling about hands but PT and caregiver present unsure regarding the meaning  Brought out  basketball hoop and foam balls and pt again refused to participate. Stating  the balls are in this basket I don't want to throw them anywhere Various demonstrations and encouragement was not effective.    Self care Had discussion with patient's caregiver regarding why treatment was not tolerated the session.  She does report that he has consistent sundowning syndrome late in the afternoon and that is likely why he refused to participate during session.  Physical therapist and caregiver discussed not having appointments to go after 4:00 this was relayed to our administrative staff in order to adjust his schedule as appropriate.  Also instructed patient and caregiver that if patient consistently refuses to participate in physical therapy services that we would not be able to continue with services at the outpatient setting.  Patient caregiver verbalizes understanding.  PATIENT EDUCATION: Education details: POC Person educated: Patient Education method: Explanation Education comprehension: verbalized understanding   HOME EXERCISE PROGRAM: Establish visit 2    GOALS: Goals reviewed with patient? Yes  SHORT TERM GOALS: Target date: 04/03/2024    Pt will show proficiency with HEP to maintain progress made in PT and increase overall QoL Baseline: Not yet given Goal status: INITIAL    LONG TERM GOALS: Target date: 04/03/2024    Pt will increase his SIS-16 score by 10 points to increase overall QoL as it relates to his CVA. Baseline: 29 Goal status: INITIAL  2.  Pt will decrease TUG time 30 sec or less without deviating from TUG pathway  to indicate reduced fall risk.  Baseline: 70 seconds  Goal status: INITIAL  3.  Pt will decrease 5xSTS time by 15 seconds to improve strength and power and increase overall QoL.  Baseline: 40 seconds  Goal status: INITIAL  4.  Pt will increase his speed on the by 0.25 m/s to decrease his risk of falls and improve his QoL as a limited  community Ambulator.  Baseline: 0.25 m/s  Goal status: INITIAL   ASSESSMENT:  CLINICAL IMPRESSION:  Patient presents to physical therapy this date showing signs of sundowning at this time.  Patient initially dissipates in 6-minute walk test but only ambulates at a very slow gait speed.  Instructed to perform basic exercise such as sit to stand as well as more fun and mild exercise such as a ball toss or a basketball shot patient again refuses to participate despite various forms of encouragement from both physical therapist and caregiver.  Physical therapist has instructed for patient's schedule to be adjusted to prevent late afternoon appointments in hopes that this will improve patient's willingness to participate during interventions.  This is not effective no other effective option is found instructed caregiver that would not be able to continue in the outpatient setting without adequate participation from the patient.  Patient's caregiver verbalizes understanding.  Patient would benefit from skilled physical therapy if he is able to participate as he is still at high risk of falls and ambulating at very low level.  However, his stage of  dementia may limit his ability to participate.  OBJECTIVE IMPAIRMENTS: Abnormal gait, decreased activity tolerance, decreased cognition, decreased endurance, decreased mobility, difficulty walking, decreased strength, impaired UE functional use, and improper body mechanics.   ACTIVITY LIMITATIONS: carrying, lifting, squatting, stairs, transfers, and bed mobility  PARTICIPATION LIMITATIONS: meal prep, cleaning, laundry, medication management, personal finances, interpersonal relationship, driving, shopping, community activity, and occupation  PERSONAL FACTORS: Age are also affecting patient's functional outcome.   REHAB POTENTIAL: Good  CLINICAL DECISION MAKING: Evolving/moderate complexity  EVALUATION COMPLEXITY: Moderate  PLAN:  PT FREQUENCY:  2x/week  PT DURATION: 12 weeks  PLANNED INTERVENTIONS: 97110-Therapeutic exercises, 97530- Therapeutic activity, [id number]- Neuromuscular re-education, 97535- Self Care, and [id number]- Manual therapy  PLAN FOR NEXT SESSION: HEP, LE strength, balance    Lonni KATHEE Gainer, PT 01/18/2024, 5:15 PM

## 2024-01-24 ENCOUNTER — Ambulatory Visit: Admitting: Occupational Therapy

## 2024-01-24 ENCOUNTER — Ambulatory Visit: Admitting: Physical Therapy

## 2024-01-24 DIAGNOSIS — R262 Difficulty in walking, not elsewhere classified: Secondary | ICD-10-CM

## 2024-01-24 DIAGNOSIS — I6300 Cerebral infarction due to thrombosis of unspecified precerebral artery: Secondary | ICD-10-CM | POA: Diagnosis not present

## 2024-01-24 DIAGNOSIS — M6281 Muscle weakness (generalized): Secondary | ICD-10-CM

## 2024-01-24 DIAGNOSIS — R29898 Other symptoms and signs involving the musculoskeletal system: Secondary | ICD-10-CM | POA: Diagnosis not present

## 2024-01-24 DIAGNOSIS — R2689 Other abnormalities of gait and mobility: Secondary | ICD-10-CM

## 2024-01-24 NOTE — Therapy (Signed)
 OUTPATIENT PHYSICAL THERAPY NEURO TREATMENT   Patient Name: Alexander Donaldson MRN: [id number] DOB:11/16/1933, 89 y.o., male Today's Date: 01/24/2024   PCP: [facility]  REFERRING PROVIDER: Eather Popp, MD  END OF SESSION:   PT End of Session - 01/24/24 1533     Visit Number 3    Number of Visits 24    Date for PT Re-Evaluation 04/03/24    Progress Note Due on Visit 10    PT Start Time 1530    PT Stop Time 1614    PT Time Calculation (min) 44 min    Equipment Utilized During Treatment Gait belt    Activity Tolerance Patient tolerated treatment well    Behavior During Therapy [facility] for tasks assessed/performed            Past Medical History:  Diagnosis Date   Acute encephalopathy 02/12/2018   Alzheimer's dementia (HCC)    CAD (coronary artery disease)    CAP (community acquired pneumonia) 02/11/2018   Carpal tunnel syndrome, bilateral 02/25/2020   Coronary artery disease    Diabetes mellitus    Diverticulitis    Hearing loss    Hypertension    Hypokalemia 02/12/2018   NSTEMI (non-ST elevated myocardial infarction) [facility])    Past Surgical History:  Procedure Laterality Date   ANGIOPLASTY     CORONARY STENT INTERVENTION N/A 02/02/2017   Procedure: Coronary Stent Intervention;  Surgeon: Levern Hutching, MD;  Location: [facility] INVASIVE CV LAB;  Service: Cardiovascular;  Laterality: N/A;   CORONARY STENTS     ESOPHAGOGASTRODUODENOSCOPY (EGD) WITH PROPOFOL  N/A 09/08/2023   Procedure: ESOPHAGOGASTRODUODENOSCOPY (EGD) WITH PROPOFOL ;  Surgeon: Legrand Victory LITTIE DOUGLAS, MD;  Location: [facility] ENDOSCOPY;  Service: Gastroenterology;  Laterality: N/A;   LEFT HEART CATH AND CORONARY ANGIOGRAPHY N/A 02/02/2017   Procedure: Left Heart Cath and Coronary Angiography;  Surgeon: Levern Hutching, MD;  Location: [facility] INVASIVE CV LAB;  Service: Cardiovascular;  Laterality: N/A;   LEFT HEART CATHETERIZATION WITH CORONARY ANGIOGRAM N/A 03/16/2012   Procedure: LEFT HEART CATHETERIZATION WITH CORONARY  ANGIOGRAM;  Surgeon: Hutching LOISE Levern, MD;  Location: [facility] CATH LAB;  Service: Cardiovascular;  Laterality: N/A;   PERCUTANEOUS CORONARY STENT INTERVENTION (PCI-S)  03/16/2012   Procedure: PERCUTANEOUS CORONARY STENT INTERVENTION (PCI-S);  Surgeon: Hutching LOISE Levern, MD;  Location: [facility] CATH LAB;  Service: Cardiovascular;;   TONSILLECTOMY     Patient Active Problem List   Diagnosis Date Noted   Palliative care by specialist 09/11/2023   DNR (do not resuscitate) 09/11/2023   CVA (cerebral vascular accident) (HCC) 09/10/2023   History of CVA 09/04/2023 (cerebrovascular accident) 09/10/2023   History of GI bleed 09/10/2023   Acute CVA (cerebrovascular accident) (HCC) 09/10/2023   Iron  deficiency anemia due to chronic blood loss 09/07/2023   Heme positive stool 09/07/2023   TIA (transient ischemic attack) 09/04/2023   Acute upper GI bleed 09/04/2023   Acute blood loss anemia 09/04/2023   Facial droop 09/04/2023   Weakness of right upper extremity 09/04/2023   Acute kidney injury superimposed on stage 3b chronic kidney disease (HCC) 09/04/2023   Paroxysmal atrial fibrillation (HCC) 09/04/2023   Chronic diastolic CHF (congestive heart failure) (HCC) 09/04/2023   Atrial fibrillation with RVR (HCC) 07/02/2023   NSTEMI, initial episode of care (HCC) 07/02/2023   Exposure to COVID-19 virus 03/23/2023   Dysphagia, unspecified 02/01/2023   Goals of care, counseling/discussion 02/01/2023   Arthralgia 07/05/2022   NSTEMI (non-ST elevated myocardial infarction) (HCC)    Accelerated junctional rhythm 01/30/2022  Dementia without behavioral disturbance (HCC) 01/30/2022   Carpal tunnel syndrome, bilateral 02/25/2020   Numbness 11/28/2019   Raynaud's phenomenon 10/30/2019   CAD (coronary artery disease) 02/12/2018   Left foot drop 02/12/2018   Non-insulin  dependent type 2 diabetes mellitus (HCC) 02/12/2018   Essential hypertension 02/12/2018   Hypokalemia 02/12/2018    ONSET DATE: 2020   REFERRING  DIAG:  I63.00 (ICD-10-CM) - Cerebrovascular accident (CVA) due to thrombosis of precerebral artery (HCC)  R29.898 (ICD-10-CM) - Right hand weakness    THERAPY DIAG:  Other abnormalities of gait and mobility  Difficulty in walking, not elsewhere classified  Impaired ambulation  Muscle weakness (generalized)  Rationale for Evaluation and Treatment: Rehabilitation  SUBJECTIVE:                                                                                                                                                                                             SUBJECTIVE STATEMENT: 01/24/24: Wife, [name], with patient today. Reports pt is hard of hearing, hears better in left ear. Wife reports her main concern is patient's balance. She states pt is able to get up, but walks slow. She states she is uncomfortable with him walking by himself. She reports she has been encouraging him to use the RW, but he doesn't always want to/like to use it. Patient continues to be sundowning some during today's session, but with wife's presence and encouragement, he is able to participate more today. Wife reports she is working with scheduling to move his appointments closer to the 1:15pm and 2:00pm slots.    From Eval: Pt presents to clinic in transport chair accompanied by his wife. They say that this decline started in 2020 after a visit to the [place] where he was diagnosed with vascular dementia. Wife reports that pt was independent with ADL for four years until 2024, when he started needing assitance for most activities of daily living d/t cognitive decline. His stroke 3 months ago further exacerbated his B UE numbness and LE weakness, although his wife reports that his LE is now not as weak. Pt cognition is significantly impaired, needing constant redirection and simple instructions. Pt previously used SPC at home until 2024, and has now transitioned to a 2WW. Wife reports she has cameras in her home to monitor  patient in case of falls. Wife reported severe musculature atrophy of thenar eminence.  Pt accompanied by: significant other  PERTINENT HISTORY: Cerebral infarction, unspecified (HCC)  Hypertensive heart and chronic kidney disease with heart failure and stage 1 through stage 4 chronic kidney disease, or unspecified chronic kidney disease (HCC)  Gastrointestinal hemorrhage, unspecified  Chronic kidney disease, unspecified  Paroxysmal atrial fibrillation (HCC)  Hypokalemia  Chronic combined systolic (congestive) and diastolic (congestive) heart failure (HCC)  Type 2 diabetes mellitus with diabetic chronic kidney disease (HCC)  PAIN:  Are you having pain? No  PRECAUTIONS: Fall  RED FLAGS: None   WEIGHT BEARING RESTRICTIONS: No  FALLS: Has patient fallen in last 6 months? No  LIVING ENVIRONMENT: Lives with: lives with their family and lives with their spouse Lives in: House/apartment Stairs: ramp Has following equipment at home: Vannie - 2 wheeled  PLOF: Independent  PATIENT GOALS: Walking around the house stronger   OBJECTIVE:  Note: Objective measures were completed at Evaluation unless otherwise noted.  DIAGNOSTIC FINDINGS:   Per Ct Head code stroke without contrast on 09/10/23:   The study is moderately degraded by patient motion. The right paramedian pontine infarct is noted.   Moderate generalized atrophy and white matter disease is otherwise stable. The ventricles are proportionate to the degree of atrophy. Chronic encephalomalacia is again noted at the left temporal tip.   The brainstem and cerebellum are otherwise within normal limits. Midline structures are within normal limits.  Vascular: Atherosclerotic calcifications are present within the cavernous internal carotid arteries and at the dural margin of both vertebral arteries. No hyperdense vessel is present.   Skull: Calvarium is intact. No focal lytic or blastic lesions are present. No  significant extracranial soft tissue lesion is present.   Sinuses/Orbits: The paranasal sinuses and mastoid air cells are clear. The globes and orbits are within normal limits.  COGNITION: Overall cognitive status: Impaired     POSTURE: rounded shoulders, forward head, increased thoracic kyphosis, anterior pelvic tilt, and flexed trunk   LOWER EXTREMITY ROM:     WFL   LOWER EXTREMITY MMT:    B hip flexors and knee extensors were observed to move within functional range. Formal MMTs deferred d/t patient not understanding test parameters.    GAIT: Findings: Gait Characteristics: decreased hip/knee flexion- Right, decreased hip/knee flexion- Left, shuffling, trunk flexed, poor foot clearance- Right, and poor foot clearance- Left, Distance walked: 50ft, Assistive device utilized:Walker - 2 wheeled, Level of assistance: CGA, and Comments: CGA-MinA  FUNCTIONAL TESTS:  5 times sit to stand: 40 seconds - Heavy  B UE use  Timed up and go (TUG): 70 seconds  10 meter walk test: 40s  PATIENT SURVEYS:   SIS16 - 29                                                                                                                               TREATMENT DATE: 01/24/24  Unless otherwise stated, CGA/light min A was provided and gait belt donned in order to ensure pt safety throughout session.  L stand pivot transport chair>green chair with encouragement to use RW, pt tries to push RW away but with gentle encouragement and redirection uses it for R UE support.  Therapist places RW in front of pt to  come to stand from green chair and he states he doesn't want to use RW and pushes it away from him, but with encouragement from his wife he is agreeable.   Gait training 365ft using RW with skilled light min assist for steadying and improved AD management. Pt demonstrating the following gait deviations with therapist providing the described cuing and facilitation for improvement:  Pt reluctant to  walk away from his wife, [name], so she ambulates beside him with therapist guarding on R side due to R side being impaired from prior CVA Noticed pt with likely R visual field cut with decreased ability to attend to cues and instructions from therapist on that side (pt also HOH in R ear) Pt with excessive forward trunk flexion pushing AD too far forward with downward gaze Short step lengths bilaterally with decreased foot clearance (R more impaired than L) Wife reports pt enjoys listening to music when they exercise at home so she played one of his favorite songs; however, this made pt become tearful/emotional so discontinued music   Dynamic standing balance and B LE functional strengthening at balance bar, including:  Standing with B UE support on balance bar during R LE foot taps to 2 Blaze Pods on white step transitioned to alternating foot taps - 77min30sec  Requires mod cuing for continued participation Skilled light min A/CGA for steadying/balance Lateral side stepping over PVC pipe with Blaze Pod on either side, set on sequence for pt to tap with his foot - using B UE support on balance bar Requires skilled min A for balance and therapist having to move PVC frequently for him to step over successfully Decreased ability to tap R foot to get pod light to turn off compared to L foot Pt's R hand fell off balance bar 3x with pt having decreased attention/awareness to that arm Performed for 2 min 30sec achieving 10 hits  Pt intermittently tearful and emotional throughout session with wife describing it as pt becoming sentimental and he benefits from the comfort of his wife.  Gait training additional 368ft using RW with skilled min assist for balance and AD management - tends to have AD veer towards L - pt dose demo improving upright posture and upright gaze during this gait compared to one at beginning of session; however, not consistent.   PATIENT EDUCATION: Education details: POC Person  educated: Patient Education method: Explanation Education comprehension: verbalized understanding   HOME EXERCISE PROGRAM: Establish visit 2    GOALS: Goals reviewed with patient? Yes  SHORT TERM GOALS: Target date: 04/03/2024    Pt will show proficiency with HEP to maintain progress made in PT and increase overall QoL Baseline: Not yet given Goal status: INITIAL    LONG TERM GOALS: Target date: 04/03/2024    Pt will increase his SIS-16 score by 10 points to increase overall QoL as it relates to his CVA. Baseline: 29 Goal status: INITIAL  2.  Pt will decrease TUG time 30 sec or less without deviating from TUG pathway  to indicate reduced fall risk.  Baseline: 70 seconds  Goal status: INITIAL  3.  Pt will decrease 5xSTS time by 15 seconds to improve strength and power and increase overall QoL.  Baseline: 40 seconds  Goal status: INITIAL  4.  Pt will increase his speed on the by 0.25 m/s to decrease his risk of falls and improve his QoL as a limited community Ambulator.  Baseline: 0.25 m/s  Goal status: INITIAL   ASSESSMENT:  CLINICAL IMPRESSION: Patient again presents with symptoms of sundowning during today's session; however, is able to increase participation in therapy session with his wife's encouragement. Patient participated in 369ft gait training using RW x2 reps with pt demonstrating poor AD management pushing it excessively forward and veering towards L as well as decreased step lengths and foot clearance (R more impaired than L). Wife expresses she has been working toward encouraging patient to use RW due to her concerns regarding his balance. Wife expresses another goal of OPPT is to improve the strength of pt's R LE. Pt able to participate in standing balance and B LE strengthening task of foot taps on a step to Blaze Pods then progress to side stepping over PVC pipe to tap [employer]. Patient benefits from external target to tap, but does require heavy  mod/max cuing to sustain attention to task and execute correctly. Patient's wife is working with schedulers to move his appointment times to earlier in afternoon to avoid when he starts to sundown. The pt will benefit from further skilled PT to address balance deficits and R hemiparesis in order to increase QOL, decrease fall risk, and ease/safety with ADLs.   OBJECTIVE IMPAIRMENTS: Abnormal gait, decreased activity tolerance, decreased cognition, decreased endurance, decreased mobility, difficulty walking, decreased strength, impaired UE functional use, and improper body mechanics.   ACTIVITY LIMITATIONS: carrying, lifting, squatting, stairs, transfers, and bed mobility  PARTICIPATION LIMITATIONS: meal prep, cleaning, laundry, medication management, personal finances, interpersonal relationship, driving, shopping, community activity, and occupation  PERSONAL FACTORS: Age are also affecting patient's functional outcome.   REHAB POTENTIAL: Good  CLINICAL DECISION MAKING: Evolving/moderate complexity  EVALUATION COMPLEXITY: Moderate  PLAN:  PT FREQUENCY: 2x/week  PT DURATION: 12 weeks  PLANNED INTERVENTIONS: 97110-Therapeutic exercises, 97530- Therapeutic activity, 97112- Neuromuscular re-education, 97535- Self Care, and [id number]- Manual therapy  PLAN FOR NEXT SESSION:  - initiate HEP if appropriate, wife reports she has exercises they already do, so maybe just review those and advice on whether to change or continue them - R LE strength - standing balance  - RW management during gait      Aleenah Homen, PT, DPT, NCS, CSRS Physical Therapist - [place]  [facility]  4:18 PM 01/24/24

## 2024-01-24 NOTE — Therapy (Unsigned)
 OUTPATIENT OCCUPATIONAL THERAPY TREATMENT NOTE  Patient Name: Alexander Donaldson MRN: [id number] DOB:May 15, 1934, 89 y.o., male Today's Date: 01/25/2024  PCP: [facility] [place] [facility] REFERRING PROVIDER: Rosemarie Eather RAMAN, MD  END OF SESSION:  OT End of Session - 01/25/24 1218     Visit Number 2    Number of Visits 24    Date for OT Re-Evaluation 04/03/24    OT Start Time 1445    OT Stop Time 1530    OT Time Calculation (min) 45 min    Activity Tolerance Patient tolerated treatment well    Behavior During Therapy [facility] for tasks assessed/performed           Past Medical History:  Diagnosis Date   Acute encephalopathy 02/12/2018   Alzheimer's dementia (HCC)    CAD (coronary artery disease)    CAP (community acquired pneumonia) 02/11/2018   Carpal tunnel syndrome, bilateral 02/25/2020   Coronary artery disease    Diabetes mellitus    Diverticulitis    Hearing loss    Hypertension    Hypokalemia 02/12/2018   NSTEMI (non-ST elevated myocardial infarction) [facility])    Past Surgical History:  Procedure Laterality Date   ANGIOPLASTY     CORONARY STENT INTERVENTION N/A 02/02/2017   Procedure: Coronary Stent Intervention;  Surgeon: Levern Hutching, MD;  Location: [facility] INVASIVE CV LAB;  Service: Cardiovascular;  Laterality: N/A;   CORONARY STENTS     ESOPHAGOGASTRODUODENOSCOPY (EGD) WITH PROPOFOL  N/A 09/08/2023   Procedure: ESOPHAGOGASTRODUODENOSCOPY (EGD) WITH PROPOFOL ;  Surgeon: Legrand Victory LITTIE DOUGLAS, MD;  Location: [facility] ENDOSCOPY;  Service: Gastroenterology;  Laterality: N/A;   LEFT HEART CATH AND CORONARY ANGIOGRAPHY N/A 02/02/2017   Procedure: Left Heart Cath and Coronary Angiography;  Surgeon: Levern Hutching, MD;  Location: [facility] INVASIVE CV LAB;  Service: Cardiovascular;  Laterality: N/A;   LEFT HEART CATHETERIZATION WITH CORONARY ANGIOGRAM N/A 03/16/2012   Procedure: LEFT HEART CATHETERIZATION WITH CORONARY ANGIOGRAM;  Surgeon: Hutching LOISE Levern, MD;  Location: [facility] CATH LAB;  Service:  Cardiovascular;  Laterality: N/A;   PERCUTANEOUS CORONARY STENT INTERVENTION (PCI-S)  03/16/2012   Procedure: PERCUTANEOUS CORONARY STENT INTERVENTION (PCI-S);  Surgeon: Hutching LOISE Levern, MD;  Location: [facility] CATH LAB;  Service: Cardiovascular;;   TONSILLECTOMY     Patient Active Problem List   Diagnosis Date Noted   Palliative care by specialist 09/11/2023   DNR (do not resuscitate) 09/11/2023   CVA (cerebral vascular accident) (HCC) 09/10/2023   History of CVA 09/04/2023 (cerebrovascular accident) 09/10/2023   History of GI bleed 09/10/2023   Acute CVA (cerebrovascular accident) (HCC) 09/10/2023   Iron  deficiency anemia due to chronic blood loss 09/07/2023   Heme positive stool 09/07/2023   TIA (transient ischemic attack) 09/04/2023   Acute upper GI bleed 09/04/2023   Acute blood loss anemia 09/04/2023   Facial droop 09/04/2023   Weakness of right upper extremity 09/04/2023   Acute kidney injury superimposed on stage 3b chronic kidney disease (HCC) 09/04/2023   Paroxysmal atrial fibrillation (HCC) 09/04/2023   Chronic diastolic CHF (congestive heart failure) (HCC) 09/04/2023   Atrial fibrillation with RVR (HCC) 07/02/2023   NSTEMI, initial episode of care (HCC) 07/02/2023   Exposure to COVID-19 virus 03/23/2023   Dysphagia, unspecified 02/01/2023   Goals of care, counseling/discussion 02/01/2023   Arthralgia 07/05/2022   NSTEMI (non-ST elevated myocardial infarction) (HCC)    Accelerated junctional rhythm 01/30/2022   Dementia without behavioral disturbance (HCC) 01/30/2022   Carpal tunnel syndrome, bilateral 02/25/2020   Numbness 11/28/2019   Raynaud's  phenomenon 10/30/2019   CAD (coronary artery disease) 02/12/2018   Left foot drop 02/12/2018   Non-insulin  dependent type 2 diabetes mellitus (HCC) 02/12/2018   Essential hypertension 02/12/2018   Hypokalemia 02/12/2018    ONSET DATE: 09/10/2023  REFERRING DIAG: LCVA MCA  THERAPY DIAG:  Muscle weakness  (generalized)  Rationale for Evaluation and Treatment: Rehabilitation  SUBJECTIVE:   SUBJECTIVE STATEMENT:  Pt. Caregiver reports that he has some good days and some bad days with initiating and participating in conversations.  Pt accompanied by: self and significant other  PERTINENT HISTORY: Pt. Had recent onset of L CVA MCA due to Afib. Pt. PMHx includes HTN, CKD IIIb, heart failure, dementia, and an Ischemic Stroke.   PRECAUTIONS: None  WEIGHT BEARING RESTRICTIONS: No  PAIN:  Are you having pain? No  FALLS: Has patient fallen in last 6 months? No  LIVING ENVIRONMENT: Lives with: lives with their family and lives with their spouse Lives in: House/apartment, 1 level Stairs:  Yes, but uses ramp. Has following equipment at home: Vannie, rollator, w/c to go out into community, shower chair, grab bars, raised toilet seat  PLOF: Independent  PATIENT GOALS: Strengthen RUE  OBJECTIVE:  Note: Objective measures were completed at Evaluation unless otherwise noted.  HAND DOMINANCE: Right  ADLs: Overall ADLs: Pt. Is unable to initiate ADL tasks.  Transfers/ambulation related to ADLs: uses w/c when wife knows they will be out long.  Eating: Requires Set Up assistance in the initiation of feeding, and requires assistance from caregivers to feed him. Able to feed himself, however, R hand pronates more due to weakness.  Grooming: Wife takes dentures and wife cleans them. Does not initiate task.  UB Dressing: Requires assistance to complete ADL tasks.  LB Dressing: Is able to put shoes on, does not typically initiate task. Uses slip on sneakers, and is able to push foot through.  Toileting: requires 2+ assistance to complete toileting tasks.  Bathing: attempts to wash his face and body Tub Shower transfers: Uses walk in shower and shower chair.  Equipment: Shower seat with back, Grab bars, and bed side commode Work: used to work as Curator Hobbies: singing IADLs: Shopping: Wife  typically does the shopping Light housekeeping: Wife typically does this task. Meal Prep: Public house manager mobility:  Medication management: Wife takes care of Meds Financial management:  Handwriting: TBD  MOBILITY STATUS: Pt. Caregiver states that if they go out for a long outing, Pt. Will use w/c to get around stores.   POSTURE COMMENTS:  No Significant postural limitations Sitting balance: Good  ACTIVITY TOLERANCE: Activity tolerance:   FUNCTIONAL OUTCOME MEASURES:   UPPER EXTREMITY ROM:    Active ROM Right eval Left eval  Shoulder flexion 92(105) 90 (130)  Shoulder abduction 78(110) 80(94)  Shoulder adduction    Shoulder extension    Shoulder internal rotation    Shoulder external rotation    Elbow flexion 132(142) 132(140)  Elbow extension -25(-16) -10 (-5)  Wrist flexion 40 (58) 10(48)  Wrist extension 30(38) 12(38)  Wrist ulnar deviation    Wrist radial deviation    Wrist pronation    Wrist supination    (Blank rows = not tested)  UPPER EXTREMITY MMT:   TBD  MMT Right eval Left eval  Shoulder flexion    Shoulder abduction    Shoulder adduction    Shoulder extension    Shoulder internal rotation    Shoulder external rotation    Middle trapezius    Lower trapezius  Elbow flexion    Elbow extension    Wrist flexion    Wrist extension    Wrist ulnar deviation    Wrist radial deviation    Wrist pronation    Wrist supination    (Blank rows = not tested)  HAND FUNCTION: TBD   COORDINATION: TBD   SENSATION:   EDEMA:   MUSCLE TONE:   COGNITION: Overall cognitive status: Impaired  VISION: Subjective report: Pt. Caregiver reports changes in vision since recent L MCA CVA. Baseline vision: Wears glasses all the time Visual history:   VISION ASSESSMENT: Not tested  Patient has difficulty with following activities due to following visual impairments:     PRAXIS: Impaired: Initiation, motor planning                                                                                                                             TREATMENT DATE: 01/24/2024   Neuromuscular re-education:  -Facilitated FMC grasp for 1 resistive cubes, grasping resistive cubes with R hand from the therapist's hand, followed by transferring cubes from R hand to L hand.  -Pt. Performed [facility] skills transferring cube from R hand to L hand at midline, in preparation for placing resistive cubes onto board using the L hand.   Therapeutic Activities:  -Pt. Worked on initiating grasping [place] rings with the right hand. -Pt. worked on reaching for [employer], and moving them through 3 levels of horizontal dowels to place rings at progressively higher heights.  -Pt. Worked on stabilizing the  board with the L hand, to promote use of R hand to grasp saebo rings and move rings through progressively higher horizontal dowels.  PATIENT EDUCATION: Education details: ROM, grip/pinch strength, and functional reaching Person educated: Patient and Caregiver [name] Education method: Explanation, Demonstration, Tactile cues, and Verbal cues Education comprehension: verbalized understanding, returned demonstration, verbal cues required, tactile cues required, and needs further education  HOME EXERCISE PROGRAM:    GOALS: Goals reviewed with patient? Yes  SHORT TERM GOALS: Target date: 02/21/2024     Pt. Will perform HEPs with  Mod A and Mod cuing. Baseline: Eval: no current HEP Goal status: INITIAL   LONG TERM GOALS: Target date: 04/03/2024    Pt. Will complete shaving using his R hand with Supervision with Min cuing using an Neurosurgeon. Baseline:  Eval: Max A and Max cues to complete shaving. Goal status: INITIAL  2.  Pt. Will use his R hand to wash his face with Supervision with Min cuing to initiate the task. Baseline:  Eval: Max A and Max cues to wash face. Goal status: INITIAL  3.  Pt. Will initiate using his R hand to flip  through a magazine independently with Min cues.  Baseline: Eval: Max A and Max cues to initiate using his R hand.  Goal status: INITIAL  4.  Pt. Will increase BUE shoulder ROM by 10 degrees to improve functional reaching tasks to elevated surfaces.  Baseline: Eval: Shoulder Felxion: R: 92(105), L: 90(130), Abduction: R: 78(110), L: 80(94) Goal status: INITIAL  5.  Pt. Will improve R hand [facility] skills to be able to independently manipulate small objects during ADLs/IADLs. Baseline: Eval: Pt. Has difficulty using the R hand to manipulate small objects during daily care tasks. Goal status: INITIAL    ASSESSMENT:  CLINICAL IMPRESSION:   Pt. Arrived with wife to treatment session today. Pt. Requires consistent Max multimodial cues and redirection to participate in functional reaching, task initiation with each rep, and [facility] tasks. Pt. Presents with limited R shoulder ROM, and decreased [facility] skills. Pt. was able to demonstrate AROM shoulder flexion to approximately 100 degrees. Pt. Was able to transfer 1 resistive cubes from L hand to R hand at midline, isolating the index finger and thumb to grasp cubes with a lateral grasp. Pt. Continues to have difficulty initiating tasks with the R hand, and requires Max multimodal cues to initiate using/incorporating the R hand in tasks.  Pt. has difficulty with initiating ADLs/IADLs using his R hand. Pt. requires Max vc, visual demonstration, and tactile cues to perform ROM. Pt. will benefit from OT services to work on initiating desired occupations, increasing [facility] skills and BUE shoulder ROM while performing ADL/IADL tasks.   PERFORMANCE DEFICITS: in functional skills including ADLs, IADLs, coordination, dexterity, ROM, strength, pain, Fine motor control, Gross motor control, vision, and UE functional use, cognitive skills including attention, problem solving, safety awareness, sequencing, thought, and understand, and psychosocial skills including environmental  adaptation, habits, interpersonal interactions, and routines and behaviors.   IMPAIRMENTS: are limiting patient from ADLs, IADLs, leisure, and social participation.   CO-MORBIDITIES: may have co-morbidities  that affects occupational performance. Patient will benefit from skilled OT to address above impairments and improve overall function.  MODIFICATION OR ASSISTANCE TO COMPLETE EVALUATION: Min-Moderate modification of tasks or assist with assess necessary to complete an evaluation.  OT OCCUPATIONAL PROFILE AND HISTORY: Detailed assessment: Review of records and additional review of physical, cognitive, psychosocial history related to current functional performance.  CLINICAL DECISION MAKING: Moderate - several treatment options, min-mod task modification necessary  REHAB POTENTIAL: Good  EVALUATION COMPLEXITY: Moderate    PLAN:  OT FREQUENCY: 2x/week  OT DURATION: 12 weeks  PLANNED INTERVENTIONS: 97168 OT Re-evaluation, 97535 self care/ADL training, 02889 therapeutic exercise, 97530 therapeutic activity, 97112 neuromuscular re-education, 97140 manual therapy, passive range of motion, visual/perceptual remediation/compensation, patient/family education, and DME and/or AE instructions  RECOMMENDED OTHER SERVICES: ST & PT  CONSULTED AND AGREED WITH PLAN OF CARE: Patient and family member/caregiver  PLAN FOR NEXT SESSION: Treatment  Elaine Jagentenfl, MS, OTR/L   This entire session was performed under direct supervision and direction of a licensed therapist/therapist assistant . I have personally read, edited and approve of the note as written.  Damien Nap, Student-OT 01/25/2024, 12:20 PM

## 2024-01-31 ENCOUNTER — Ambulatory Visit: Admitting: Occupational Therapy

## 2024-01-31 DIAGNOSIS — R29898 Other symptoms and signs involving the musculoskeletal system: Secondary | ICD-10-CM | POA: Diagnosis not present

## 2024-01-31 DIAGNOSIS — M6281 Muscle weakness (generalized): Secondary | ICD-10-CM | POA: Diagnosis not present

## 2024-01-31 DIAGNOSIS — I6300 Cerebral infarction due to thrombosis of unspecified precerebral artery: Secondary | ICD-10-CM | POA: Diagnosis not present

## 2024-01-31 DIAGNOSIS — R262 Difficulty in walking, not elsewhere classified: Secondary | ICD-10-CM | POA: Diagnosis not present

## 2024-01-31 DIAGNOSIS — R2689 Other abnormalities of gait and mobility: Secondary | ICD-10-CM | POA: Diagnosis not present

## 2024-01-31 NOTE — Therapy (Unsigned)
 OUTPATIENT OCCUPATIONAL THERAPY TREATMENT NOTE  Patient Name: Alexander Donaldson MRN: [id number] DOB:October 15, 1933, 89 y.o., male Today's Date: 02/01/2024  PCP: [facility] REFERRING PROVIDER: Rosemarie Eather RAMAN, MD  END OF SESSION:  OT End of Session - 02/01/24 0908     Visit Number 3    Number of Visits 24    Date for OT Re-Evaluation 04/03/24    OT Start Time 1400    OT Stop Time 1445    OT Time Calculation (min) 45 min    Activity Tolerance Patient tolerated treatment well    Behavior During Therapy [facility] for tasks assessed/performed            Past Medical History:  Diagnosis Date   Acute encephalopathy 02/12/2018   Alzheimer's dementia (HCC)    CAD (coronary artery disease)    CAP (community acquired pneumonia) 02/11/2018   Carpal tunnel syndrome, bilateral 02/25/2020   Coronary artery disease    Diabetes mellitus    Diverticulitis    Hearing loss    Hypertension    Hypokalemia 02/12/2018   NSTEMI (non-ST elevated myocardial infarction) [facility])    Past Surgical History:  Procedure Laterality Date   ANGIOPLASTY     CORONARY STENT INTERVENTION N/A 02/02/2017   Procedure: Coronary Stent Intervention;  Surgeon: Levern Hutching, MD;  Location: [facility] INVASIVE CV LAB;  Service: Cardiovascular;  Laterality: N/A;   CORONARY STENTS     ESOPHAGOGASTRODUODENOSCOPY (EGD) WITH PROPOFOL  N/A 09/08/2023   Procedure: ESOPHAGOGASTRODUODENOSCOPY (EGD) WITH PROPOFOL ;  Surgeon: Legrand Victory LITTIE DOUGLAS, MD;  Location: [facility] ENDOSCOPY;  Service: Gastroenterology;  Laterality: N/A;   LEFT HEART CATH AND CORONARY ANGIOGRAPHY N/A 02/02/2017   Procedure: Left Heart Cath and Coronary Angiography;  Surgeon: Levern Hutching, MD;  Location: [facility] INVASIVE CV LAB;  Service: Cardiovascular;  Laterality: N/A;   LEFT HEART CATHETERIZATION WITH CORONARY ANGIOGRAM N/A 03/16/2012   Procedure: LEFT HEART CATHETERIZATION WITH CORONARY ANGIOGRAM;  Surgeon: Hutching LOISE Levern, MD;  Location: [facility] CATH LAB;  Service:  Cardiovascular;  Laterality: N/A;   PERCUTANEOUS CORONARY STENT INTERVENTION (PCI-S)  03/16/2012   Procedure: PERCUTANEOUS CORONARY STENT INTERVENTION (PCI-S);  Surgeon: Hutching LOISE Levern, MD;  Location: [facility] CATH LAB;  Service: Cardiovascular;;   TONSILLECTOMY     Patient Active Problem List   Diagnosis Date Noted   Palliative care by specialist 09/11/2023   DNR (do not resuscitate) 09/11/2023   CVA (cerebral vascular accident) (HCC) 09/10/2023   History of CVA 09/04/2023 (cerebrovascular accident) 09/10/2023   History of GI bleed 09/10/2023   Acute CVA (cerebrovascular accident) (HCC) 09/10/2023   Iron  deficiency anemia due to chronic blood loss 09/07/2023   Heme positive stool 09/07/2023   TIA (transient ischemic attack) 09/04/2023   Acute upper GI bleed 09/04/2023   Acute blood loss anemia 09/04/2023   Facial droop 09/04/2023   Weakness of right upper extremity 09/04/2023   Acute kidney injury superimposed on stage 3b chronic kidney disease (HCC) 09/04/2023   Paroxysmal atrial fibrillation (HCC) 09/04/2023   Chronic diastolic CHF (congestive heart failure) (HCC) 09/04/2023   Atrial fibrillation with RVR (HCC) 07/02/2023   NSTEMI, initial episode of care (HCC) 07/02/2023   Exposure to COVID-19 virus 03/23/2023   Dysphagia, unspecified 02/01/2023   Goals of care, counseling/discussion 02/01/2023   Arthralgia 07/05/2022   NSTEMI (non-ST elevated myocardial infarction) (HCC)    Accelerated junctional rhythm 01/30/2022   Dementia without behavioral disturbance (HCC) 01/30/2022   Carpal tunnel syndrome, bilateral 02/25/2020   Numbness 11/28/2019  Raynaud's phenomenon 10/30/2019   CAD (coronary artery disease) 02/12/2018   Left foot drop 02/12/2018   Non-insulin  dependent type 2 diabetes mellitus (HCC) 02/12/2018   Essential hypertension 02/12/2018   Hypokalemia 02/12/2018    ONSET DATE: 09/10/2023  REFERRING DIAG: LCVA MCA  THERAPY DIAG:  Muscle weakness  (generalized)  Rationale for Evaluation and Treatment: Rehabilitation  SUBJECTIVE:   SUBJECTIVE STATEMENT:  Pt. Caregiver reports that Pt. Has been working on [facility] tasks at home with family members, such as piecing together puzzles.  Pt accompanied by: self and significant other  PERTINENT HISTORY: Pt. Had recent onset of L CVA MCA due to Afib. Pt. PMHx includes HTN, CKD IIIb, heart failure, dementia, and an Ischemic Stroke.   PRECAUTIONS: None  WEIGHT BEARING RESTRICTIONS: No  PAIN:  Are you having pain? No  FALLS: Has patient fallen in last 6 months? No  LIVING ENVIRONMENT: Lives with: lives with their family and lives with their spouse Lives in: House/apartment, 1 level Stairs:  Yes, but uses ramp. Has following equipment at home: Vannie, rollator, w/c to go out into community, shower chair, grab bars, raised toilet seat  PLOF: Independent  PATIENT GOALS: Strengthen RUE  OBJECTIVE:  Note: Objective measures were completed at Evaluation unless otherwise noted.  HAND DOMINANCE: Right  ADLs: Overall ADLs: Pt. Is unable to initiate ADL tasks.  Transfers/ambulation related to ADLs: uses w/c when wife knows they will be out long.  Eating: Requires Set Up assistance in the initiation of feeding, and requires assistance from caregivers to feed him. Able to feed himself, however, R hand pronates more due to weakness.  Grooming: Wife takes dentures and wife cleans them. Does not initiate task.  UB Dressing: Requires assistance to complete ADL tasks.  LB Dressing: Is able to put shoes on, does not typically initiate task. Uses slip on sneakers, and is able to push foot through.  Toileting: requires 2+ assistance to complete toileting tasks.  Bathing: attempts to wash his face and body Tub Shower transfers: Uses walk in shower and shower chair.  Equipment: Shower seat with back, Grab bars, and bed side commode Work: used to work as Curator Hobbies: singing IADLs: Shopping:  Wife typically does the shopping Light housekeeping: Wife typically does this task. Meal Prep: Public house manager mobility:  Medication management: Wife takes care of Meds Financial management:  Handwriting: TBD  MOBILITY STATUS: Pt. Caregiver states that if they go out for a long outing, Pt. Will use w/c to get around stores.   POSTURE COMMENTS:  No Significant postural limitations Sitting balance: Good  ACTIVITY TOLERANCE: Activity tolerance:   FUNCTIONAL OUTCOME MEASURES:   UPPER EXTREMITY ROM:    Active ROM Right eval Left eval  Shoulder flexion 92(105) 90 (130)  Shoulder abduction 78(110) 80(94)  Shoulder adduction    Shoulder extension    Shoulder internal rotation    Shoulder external rotation    Elbow flexion 132(142) 132(140)  Elbow extension -25(-16) -10 (-5)  Wrist flexion 40 (58) 10(48)  Wrist extension 30(38) 12(38)  Wrist ulnar deviation    Wrist radial deviation    Wrist pronation    Wrist supination    (Blank rows = not tested)  UPPER EXTREMITY MMT:   TBD  MMT Right eval Left eval  Shoulder flexion    Shoulder abduction    Shoulder adduction    Shoulder extension    Shoulder internal rotation    Shoulder external rotation    Middle trapezius  Lower trapezius    Elbow flexion    Elbow extension    Wrist flexion    Wrist extension    Wrist ulnar deviation    Wrist radial deviation    Wrist pronation    Wrist supination    (Blank rows = not tested)  HAND FUNCTION: TBD   COORDINATION: TBD   SENSATION:   EDEMA:   MUSCLE TONE:   COGNITION: Overall cognitive status: Impaired  VISION: Subjective report: Pt. Caregiver reports changes in vision since recent L MCA CVA. Baseline vision: Wears glasses all the time Visual history:   VISION ASSESSMENT: Not tested  Patient has difficulty with following activities due to following visual impairments:     PRAXIS: Impaired: Initiation, motor planning                                                                                                                             TREATMENT DATE: 02/01/2024   Therapeutic Activities:   -Pt. Worked on tasks focusing on promoting task initiation, and functional hand use with the right hand. -Pt. Worked on BUE [facility] skills and functional reaching using 1.5 pegs into [employer] Pegboard to transfer pegs from R hand to L hand, in preparation to place pegs into pegboard using the L hand.  -To further challenge the task, Pt. Removed pegs from one slotted target into another, using the R hand.  -Pt. Placed 1.5 pegs into jumbo Judy Pegboard, placed at a vertical angle, using the L hand to grasp and place pegs. -Pt. Worked on initiating to grasp jumbo pegs from pegboard using R hand.  -Pt. Removed Pegs from jumbo pegboard, using the L hand to transfer to R hand, in preparation to placing pegs into container.   Neuromuscular re-education:   -Facilitated [facility] skills while flipping through a magazine, using bilateral hand coordination to manipulate and separate pages.   Self-care:   -facilitated initiation of hand to mouth patterns using the right hand with moderate cuing, hand-over hand assist, and increased time.    PATIENT EDUCATION: Education details: ROM, grip/pinch strength, and functional reaching Person educated: Patient and Caregiver [name] Education method: Explanation, Demonstration, Tactile cues, and Verbal cues Education comprehension: verbalized understanding, returned demonstration, verbal cues required, tactile cues required, and needs further education  HOME EXERCISE PROGRAM:    GOALS: Goals reviewed with patient? Yes  SHORT TERM GOALS: Target date: 02/21/2024     Pt. Will perform HEPs with  Mod A and Mod cuing. Baseline: Eval: no current HEP Goal status: INITIAL   LONG TERM GOALS: Target date: 04/03/2024    Pt. Will complete shaving using his R hand with Supervision with Min cuing  using an Neurosurgeon. Baseline:  Eval: Max A and Max cues to complete shaving. Goal status: INITIAL  2.  Pt. Will use his R hand to wash his face with Supervision with Min cuing to initiate the task. Baseline:  Eval: Max A and Max cues  to wash face. Goal status: INITIAL  3.  Pt. Will initiate using his R hand to flip through a magazine independently with Min cues.  Baseline: Eval: Max A and Max cues to initiate using his R hand.  Goal status: INITIAL  4.  Pt. Will increase BUE shoulder ROM by 10 degrees to improve functional reaching tasks to elevated surfaces.  Baseline: Eval: Shoulder Felxion: R: 92(105), L: 90(130), Abduction: R: 78(110), L: 80(94) Goal status: INITIAL  5.  Pt. Will improve R hand [facility] skills to be able to independently manipulate small objects during ADLs/IADLs. Baseline: Eval: Pt. Has difficulty using the R hand to manipulate small objects during daily care tasks. Goal status: INITIAL    ASSESSMENT:  CLINICAL IMPRESSION:  Pt. arrived with wife to treatment session today. Pt. requires consistent  heavy vc, tactile cues and visual demonstration to initiate use of R hand during [facility] and functional reaching tasks. Pt. Does require  re-direction during the treatment session for functional reaching and [facility] tasks. Pt. Did require periodic redirection onto a different task as Pt. cognitively fatigues of a task in order to continue keeping him engaged. Pt. required support at the R wrist and elbow during functional reaching tasks. [facility] tasks were modified for the Pt. Pt. grasped pegs from the therapist's hands using his R hand, positioned in a vertical position in preparation for placing pegs into pegboard at a vertical angle. Pt. required tactile cues at the dorsal aspect of the R hand, to initiate use of R hand to place pegs into pegboard. Pt. will benefit from OT services to work on initiating desired occupations, increasing [facility] skills and BUE shoulder ROM while performing  ADL/IADL tasks.   PERFORMANCE DEFICITS: in functional skills including ADLs, IADLs, coordination, dexterity, ROM, strength, pain, Fine motor control, Gross motor control, vision, and UE functional use, cognitive skills including attention, problem solving, safety awareness, sequencing, thought, and understand, and psychosocial skills including environmental adaptation, habits, interpersonal interactions, and routines and behaviors.   IMPAIRMENTS: are limiting patient from ADLs, IADLs, leisure, and social participation.   CO-MORBIDITIES: may have co-morbidities  that affects occupational performance. Patient will benefit from skilled OT to address above impairments and improve overall function.  MODIFICATION OR ASSISTANCE TO COMPLETE EVALUATION: Min-Moderate modification of tasks or assist with assess necessary to complete an evaluation.  OT OCCUPATIONAL PROFILE AND HISTORY: Detailed assessment: Review of records and additional review of physical, cognitive, psychosocial history related to current functional performance.  CLINICAL DECISION MAKING: Moderate - several treatment options, min-mod task modification necessary  REHAB POTENTIAL: Good  EVALUATION COMPLEXITY: Moderate    PLAN:  OT FREQUENCY: 2x/week  OT DURATION: 12 weeks  PLANNED INTERVENTIONS: 97168 OT Re-evaluation, 97535 self care/ADL training, 02889 therapeutic exercise, 97530 therapeutic activity, 97112 neuromuscular re-education, 97140 manual therapy, passive range of motion, visual/perceptual remediation/compensation, patient/family education, and DME and/or AE instructions  RECOMMENDED OTHER SERVICES: ST & PT  CONSULTED AND AGREED WITH PLAN OF CARE: Patient and family member/caregiver  PLAN FOR NEXT SESSION: Treatment  Richardson Otter, MS, OTR/L  Damien Nap, Student-OT  This entire session was performed under direct supervision and direction of a licensed therapist/therapist assistant . I have personally  read, edited and approve of the note as written.  Richardson Otter, MS, OTR/L  02/01/2024, 9:11 AM

## 2024-02-01 DIAGNOSIS — I2510 Atherosclerotic heart disease of native coronary artery without angina pectoris: Secondary | ICD-10-CM | POA: Diagnosis not present

## 2024-02-01 DIAGNOSIS — I639 Cerebral infarction, unspecified: Secondary | ICD-10-CM | POA: Diagnosis not present

## 2024-02-01 DIAGNOSIS — Z8679 Personal history of other diseases of the circulatory system: Secondary | ICD-10-CM | POA: Diagnosis not present

## 2024-02-01 DIAGNOSIS — I10 Essential (primary) hypertension: Secondary | ICD-10-CM | POA: Diagnosis not present

## 2024-02-07 ENCOUNTER — Ambulatory Visit: Admitting: Physical Therapy

## 2024-02-07 ENCOUNTER — Ambulatory Visit: Admitting: Occupational Therapy

## 2024-02-07 NOTE — Therapy (Deleted)
 OUTPATIENT PHYSICAL THERAPY NEURO TREATMENT   Patient Name: Alexander Donaldson MRN: [id number] DOB:1933/10/04, 89 y.o., male Today's Date: 02/07/2024   PCP: [facility]  REFERRING PROVIDER: Eather Popp, MD  END OF SESSION:       Past Medical History:  Diagnosis Date   Acute encephalopathy 02/12/2018   Alzheimer's dementia (HCC)    CAD (coronary artery disease)    CAP (community acquired pneumonia) 02/11/2018   Carpal tunnel syndrome, bilateral 02/25/2020   Coronary artery disease    Diabetes mellitus    Diverticulitis    Hearing loss    Hypertension    Hypokalemia 02/12/2018   NSTEMI (non-ST elevated myocardial infarction) [facility])    Past Surgical History:  Procedure Laterality Date   ANGIOPLASTY     CORONARY STENT INTERVENTION N/A 02/02/2017   Procedure: Coronary Stent Intervention;  Surgeon: Levern Hutching, MD;  Location: [facility] INVASIVE CV LAB;  Service: Cardiovascular;  Laterality: N/A;   CORONARY STENTS     ESOPHAGOGASTRODUODENOSCOPY (EGD) WITH PROPOFOL  N/A 09/08/2023   Procedure: ESOPHAGOGASTRODUODENOSCOPY (EGD) WITH PROPOFOL ;  Surgeon: Legrand Victory LITTIE DOUGLAS, MD;  Location: [facility] ENDOSCOPY;  Service: Gastroenterology;  Laterality: N/A;   LEFT HEART CATH AND CORONARY ANGIOGRAPHY N/A 02/02/2017   Procedure: Left Heart Cath and Coronary Angiography;  Surgeon: Levern Hutching, MD;  Location: [facility] INVASIVE CV LAB;  Service: Cardiovascular;  Laterality: N/A;   LEFT HEART CATHETERIZATION WITH CORONARY ANGIOGRAM N/A 03/16/2012   Procedure: LEFT HEART CATHETERIZATION WITH CORONARY ANGIOGRAM;  Surgeon: Hutching LOISE Levern, MD;  Location: [facility] CATH LAB;  Service: Cardiovascular;  Laterality: N/A;   PERCUTANEOUS CORONARY STENT INTERVENTION (PCI-S)  03/16/2012   Procedure: PERCUTANEOUS CORONARY STENT INTERVENTION (PCI-S);  Surgeon: Hutching LOISE Levern, MD;  Location: [facility] CATH LAB;  Service: Cardiovascular;;   TONSILLECTOMY     Patient Active Problem List   Diagnosis Date Noted   Palliative care  by specialist 09/11/2023   DNR (do not resuscitate) 09/11/2023   CVA (cerebral vascular accident) (HCC) 09/10/2023   History of CVA 09/04/2023 (cerebrovascular accident) 09/10/2023   History of GI bleed 09/10/2023   Acute CVA (cerebrovascular accident) (HCC) 09/10/2023   Iron  deficiency anemia due to chronic blood loss 09/07/2023   Heme positive stool 09/07/2023   TIA (transient ischemic attack) 09/04/2023   Acute upper GI bleed 09/04/2023   Acute blood loss anemia 09/04/2023   Facial droop 09/04/2023   Weakness of right upper extremity 09/04/2023   Acute kidney injury superimposed on stage 3b chronic kidney disease (HCC) 09/04/2023   Paroxysmal atrial fibrillation (HCC) 09/04/2023   Chronic diastolic CHF (congestive heart failure) (HCC) 09/04/2023   Atrial fibrillation with RVR (HCC) 07/02/2023   NSTEMI, initial episode of care (HCC) 07/02/2023   Exposure to COVID-19 virus 03/23/2023   Dysphagia, unspecified 02/01/2023   Goals of care, counseling/discussion 02/01/2023   Arthralgia 07/05/2022   NSTEMI (non-ST elevated myocardial infarction) (HCC)    Accelerated junctional rhythm 01/30/2022   Dementia without behavioral disturbance (HCC) 01/30/2022   Carpal tunnel syndrome, bilateral 02/25/2020   Numbness 11/28/2019   Raynaud's phenomenon 10/30/2019   CAD (coronary artery disease) 02/12/2018   Left foot drop 02/12/2018   Non-insulin  dependent type 2 diabetes mellitus (HCC) 02/12/2018   Essential hypertension 02/12/2018   Hypokalemia 02/12/2018    ONSET DATE: 2020   REFERRING DIAG:  I63.00 (ICD-10-CM) - Cerebrovascular accident (CVA) due to thrombosis of precerebral artery (HCC)  R29.898 (ICD-10-CM) - Right hand weakness    THERAPY DIAG:  Muscle weakness (generalized)  Other abnormalities of gait and mobility  Difficulty in walking, not elsewhere classified  Impaired ambulation  Rationale for Evaluation and Treatment: Rehabilitation  SUBJECTIVE:                                                                                                                                                                                              SUBJECTIVE STATEMENT: 02/07/24: Wife, Alexander Donaldson, with patient today. Reports pt is hard of hearing, hears better in left ear. Wife reports her main concern is patient's balance. She states pt is able to get up, but walks slow. She states she is uncomfortable with him walking by himself. She reports she has been encouraging him to use the RW, but he doesn't always want to/like to use it. Patient continues to be sundowning some during today's session, but with wife's presence and encouragement, he is able to participate more today. Wife reports she is working with scheduling to move his appointments closer to the 1:15pm and 2:00pm slots.    From Eval: Pt presents to clinic in transport chair accompanied by his wife. They say that this decline started in 2020 after a visit to the [place] where he was diagnosed with vascular dementia. Wife reports that pt was independent with ADL for four years until 2024, when he started needing assitance for most activities of daily living d/t cognitive decline. His stroke 3 months ago further exacerbated his B UE numbness and LE weakness, although his wife reports that his LE is now not as weak. Pt cognition is significantly impaired, needing constant redirection and simple instructions. Pt previously used SPC at home until 2024, and has now transitioned to a 2WW. Wife reports she has cameras in her home to monitor patient in case of falls. Wife reported severe musculature atrophy of thenar eminence.  Pt accompanied by: significant other  PERTINENT HISTORY: Cerebral infarction, unspecified (HCC)  Hypertensive heart and chronic kidney disease with heart failure and stage 1 through stage 4 chronic kidney disease, or unspecified chronic kidney disease (HCC)  Gastrointestinal hemorrhage, unspecified  Chronic kidney  disease, unspecified  Paroxysmal atrial fibrillation (HCC)  Hypokalemia  Chronic combined systolic (congestive) and diastolic (congestive) heart failure (HCC)  Type 2 diabetes mellitus with diabetic chronic kidney disease (HCC)  PAIN:  Are you having pain? No  PRECAUTIONS: Fall  RED FLAGS: None   WEIGHT BEARING RESTRICTIONS: No  FALLS: Has patient fallen in last 6 months? No  LIVING ENVIRONMENT: Lives with: lives with their family and lives with their spouse Lives in: House/apartment Stairs: ramp Has following equipment at home: Vannie - 2 wheeled  PLOF: Independent  PATIENT GOALS: Walking around the house stronger   OBJECTIVE:  Note: Objective measures were completed at Evaluation unless otherwise noted.  DIAGNOSTIC FINDINGS:   Per Ct Head code stroke without contrast on 09/10/23:   The study is moderately degraded by patient motion. The right paramedian pontine infarct is noted.   Moderate generalized atrophy and white matter disease is otherwise stable. The ventricles are proportionate to the degree of atrophy. Chronic encephalomalacia is again noted at the left temporal tip.   The brainstem and cerebellum are otherwise within normal limits. Midline structures are within normal limits.  Vascular: Atherosclerotic calcifications are present within the cavernous internal carotid arteries and at the dural margin of both vertebral arteries. No hyperdense vessel is present.   Skull: Calvarium is intact. No focal lytic or blastic lesions are present. No significant extracranial soft tissue lesion is present.   Sinuses/Orbits: The paranasal sinuses and mastoid air cells are clear. The globes and orbits are within normal limits.  COGNITION: Overall cognitive status: Impaired     POSTURE: rounded shoulders, forward head, increased thoracic kyphosis, anterior pelvic tilt, and flexed trunk   LOWER EXTREMITY ROM:     WFL   LOWER EXTREMITY MMT:    B hip  flexors and knee extensors were observed to move within functional range. Formal MMTs deferred d/t patient not understanding test parameters.    GAIT: Findings: Gait Characteristics: decreased hip/knee flexion- Right, decreased hip/knee flexion- Left, shuffling, trunk flexed, poor foot clearance- Right, and poor foot clearance- Left, Distance walked: 82ft, Assistive device utilized:Walker - 2 wheeled, Level of assistance: CGA, and Comments: CGA-MinA  FUNCTIONAL TESTS:  5 times sit to stand: 40 seconds - Heavy  B UE use  Timed up and go (TUG): 70 seconds  10 meter walk test: 40s  PATIENT SURVEYS:   SIS16 - 29                                                                                                                               TREATMENT DATE: 02/07/24  Unless otherwise stated, CGA/light min A was provided and gait belt donned in order to ensure pt safety throughout session.  L stand pivot transport chair>green chair with encouragement to use RW, pt tries to push RW away but with gentle encouragement and redirection uses it for R UE support.  Therapist places RW in front of pt to come to stand from green chair and he states he doesn't want to use RW and pushes it away from him, but with encouragement from his wife he is agreeable.   Gait training 342ft using RW with skilled light min assist for steadying and improved AD management. Pt demonstrating the following gait deviations with therapist providing the described cuing and facilitation for improvement:  Pt reluctant to walk away from his wife, Alexander Donaldson, so she ambulates beside him with therapist guarding on R side due to R side being impaired from prior CVA Noticed pt  with likely R visual field cut with decreased ability to attend to cues and instructions from therapist on that side (pt also HOH in R ear) Pt with excessive forward trunk flexion pushing AD too far forward with downward gaze Short step lengths bilaterally with  decreased foot clearance (R more impaired than L) Wife reports pt enjoys listening to music when they exercise at home so she played one of his favorite songs; however, this made pt become tearful/emotional so discontinued music   Dynamic standing balance and B LE functional strengthening at balance bar, including:  Standing with B UE support on balance bar during R LE foot taps to 2 Blaze Pods on white step transitioned to alternating foot taps - 83min30sec  Requires mod cuing for continued participation Skilled light min A/CGA for steadying/balance Lateral side stepping over PVC pipe with Blaze Pod on either side, set on sequence for pt to tap with his foot - using B UE support on balance bar Requires skilled min A for balance and therapist having to move PVC frequently for him to step over successfully Decreased ability to tap R foot to get pod light to turn off compared to L foot Pt's R hand fell off balance bar 3x with pt having decreased attention/awareness to that arm Performed for 2 min 30sec achieving 10 hits  Pt intermittently tearful and emotional throughout session with wife describing it as pt becoming sentimental and he benefits from the comfort of his wife.  Gait training additional 380ft using RW with skilled min assist for balance and AD management - tends to have AD veer towards L - pt dose demo improving upright posture and upright gaze during this gait compared to one at beginning of session; however, not consistent.   PATIENT EDUCATION: Education details: POC Person educated: Patient Education method: Explanation Education comprehension: verbalized understanding   HOME EXERCISE PROGRAM: Establish visit 2    GOALS: Goals reviewed with patient? Yes  SHORT TERM GOALS: Target date: 04/03/2024    Pt will show proficiency with HEP to maintain progress made in PT and increase overall QoL Baseline: Not yet given Goal status: INITIAL    LONG TERM GOALS: Target  date: 04/03/2024    Pt will increase his SIS-16 score by 10 points to increase overall QoL as it relates to his CVA. Baseline: 29 Goal status: INITIAL  2.  Pt will decrease TUG time 30 sec or less without deviating from TUG pathway  to indicate reduced fall risk.  Baseline: 70 seconds  Goal status: INITIAL  3.  Pt will decrease 5xSTS time by 15 seconds to improve strength and power and increase overall QoL.  Baseline: 40 seconds  Goal status: INITIAL  4.  Pt will increase his speed on the by 0.25 m/s to decrease his risk of falls and improve his QoL as a limited community Ambulator.  Baseline: 0.25 m/s  Goal status: INITIAL   ASSESSMENT:  CLINICAL IMPRESSION: Patient again presents with symptoms of sundowning during today's session; however, is able to increase participation in therapy session with his wife's encouragement. Patient participated in 357ft gait training using RW x2 reps with pt demonstrating poor AD management pushing it excessively forward and veering towards L as well as decreased step lengths and foot clearance (R more impaired than L). Wife expresses she has been working toward encouraging patient to use RW due to her concerns regarding his balance. Wife expresses another goal of OPPT is to improve the strength of pt's R  LE. Pt able to participate in standing balance and B LE strengthening task of foot taps on a step to Blaze Pods then progress to side stepping over PVC pipe to tap [employer]. Patient benefits from external target to tap, but does require heavy mod/max cuing to sustain attention to task and execute correctly. Patient's wife is working with schedulers to move his appointment times to earlier in afternoon to avoid when he starts to sundown. The pt will benefit from further skilled PT to address balance deficits and R hemiparesis in order to increase QOL, decrease fall risk, and ease/safety with ADLs.   OBJECTIVE IMPAIRMENTS: Abnormal gait, decreased  activity tolerance, decreased cognition, decreased endurance, decreased mobility, difficulty walking, decreased strength, impaired UE functional use, and improper body mechanics.   ACTIVITY LIMITATIONS: carrying, lifting, squatting, stairs, transfers, and bed mobility  PARTICIPATION LIMITATIONS: meal prep, cleaning, laundry, medication management, personal finances, interpersonal relationship, driving, shopping, community activity, and occupation  PERSONAL FACTORS: Age are also affecting patient's functional outcome.   REHAB POTENTIAL: Good  CLINICAL DECISION MAKING: Evolving/moderate complexity  EVALUATION COMPLEXITY: Moderate  PLAN:  PT FREQUENCY: 2x/week  PT DURATION: 12 weeks  PLANNED INTERVENTIONS: 97110-Therapeutic exercises, 97530- Therapeutic activity, 97112- Neuromuscular re-education, 97535- Self Care, and 02859- Manual therapy  PLAN FOR NEXT SESSION:  - initiate HEP if appropriate, wife reports she has exercises they already do, so maybe just review those and advice on whether to change or continue them - R LE strength - standing balance  - RW management during gait      Note: Portions of this document were prepared using Dragon voice recognition software and although reviewed may contain unintentional dictation errors in syntax, grammar, or spelling.  Lonni KATHEE Gainer PT ,DPT Physical Therapist- [place]  [facility]   12:57 PM 02/07/24

## 2024-02-12 ENCOUNTER — Ambulatory Visit: Admitting: Podiatry

## 2024-02-12 ENCOUNTER — Encounter: Payer: Self-pay | Admitting: Podiatry

## 2024-02-12 DIAGNOSIS — B351 Tinea unguium: Secondary | ICD-10-CM | POA: Diagnosis not present

## 2024-02-12 DIAGNOSIS — M79674 Pain in right toe(s): Secondary | ICD-10-CM

## 2024-02-12 DIAGNOSIS — E1159 Type 2 diabetes mellitus with other circulatory complications: Secondary | ICD-10-CM

## 2024-02-12 DIAGNOSIS — M79675 Pain in left toe(s): Secondary | ICD-10-CM

## 2024-02-12 NOTE — Progress Notes (Signed)
  Subjective:  Patient ID: Alexander Donaldson, male    DOB: 11-24-1933,  MRN: [id number]  89 y.o. male presents to clinic today with at risk footcare. Patient has h/o diabetes, neuropathy and PAD and is seen for  and painful mycotic toenails of both feet that are difficult to trim. Pain interferes with daily activities and wearing enclosed shoe gear comfortably. Chief Complaint  Patient presents with   rfc    Pt is here to have nails trimmed  Dr. Norleen Alexander at [facility]  No changes since last visit    Patient has h/o dementia and is accompanied by his wife on today's visit.  New pedal problem(s): None   PCP is Alexander Norleen, MD .  Allergies  Allergen Reactions   Lisinopril Cough   Metformin  Diarrhea    Review of Systems: Negative except as noted in the HPI.   Objective:  Alexander Donaldson is a pleasant 89 y.o. male WD, WN in NAD. Alert, awake. Oriented x 2.  Vascular Examination: CFT <3 seconds b/l LE. Faintly palpable DP pulses b/l LE. Nonpalpable PT pulse(s) b/l LE. Pedal hair absent. No pain with calf compression b/l. Lower extremity skin temperature gradient within normal limits. No cyanosis or clubbing noted b/l LE.  Neurological Examination: Patient unable to complete sensory examination due to cognition but does respond to external noxious stimuli.   Pt has subjective symptoms of neuropathy.  Dermatological Examination: Pedal skin with normal turgor, texture and tone b/l. Toenails 1-5 b/l thick, discolored, elongated with subungual debris and pain on dorsal palpation. No hyperkeratotic lesions noted b/l.   Musculoskeletal Examination: Muscle strength 5/5 to b/l LE. Hammertoe deformity noted 2-5 b/l. Utilizes wheelchair for mobility assistance.  Radiographs: None  Last A1c:      Latest Ref Rng & Units 09/04/2023   11:33 AM  Hemoglobin A1C  Hemoglobin-A1c 4.8 - 5.6 % 5.9     Assessment:   1. Pain due to onychomycosis of toenails of both feet   2. Type 2 diabetes mellitus  with vascular disease (HCC)    Plan:  Patient was evaluated and treated. All patient's and/or POA's questions/concerns addressed on today's visit. Mycotic toenails 1-5 debrided in length and girth without incident.  Continue daily foot inspections and monitor blood glucose per PCP/Endocrinologist's recommendations.Continue soft, supportive shoe gear daily. Report any pedal injuries to medical professional. Call office if there are any quesitons/concerns. -Patient/POA to call should there be question/concern in the interim.  Return in about 3 months (around 05/14/2024).  Delon LITTIE Merlin, DPM      [place] LOCATION: [address], [place] [address]                   Office [phone]   [facility] LOCATION: [address] [place], [place] [address] Office [phone]

## 2024-02-14 ENCOUNTER — Ambulatory Visit: Admitting: Physical Therapy

## 2024-02-14 ENCOUNTER — Ambulatory Visit: Attending: Neurology

## 2024-02-14 DIAGNOSIS — M6281 Muscle weakness (generalized): Secondary | ICD-10-CM | POA: Diagnosis not present

## 2024-02-14 DIAGNOSIS — R278 Other lack of coordination: Secondary | ICD-10-CM | POA: Diagnosis not present

## 2024-02-14 DIAGNOSIS — R2689 Other abnormalities of gait and mobility: Secondary | ICD-10-CM

## 2024-02-14 DIAGNOSIS — R262 Difficulty in walking, not elsewhere classified: Secondary | ICD-10-CM

## 2024-02-14 NOTE — Therapy (Signed)
 OUTPATIENT PHYSICAL THERAPY NEURO TREATMENT   Patient Name: Alexander Donaldson MRN: [id number] DOB:04-14-1934, 89 y.o., male Today's Date: 02/14/2024   PCP: [facility]  REFERRING PROVIDER: Eather Popp, MD  END OF SESSION:   PT End of Session - 02/14/24 1401     Visit Number 4    Number of Visits 24    Date for PT Re-Evaluation 04/03/24    Progress Note Due on Visit 10    PT Start Time 1401    PT Stop Time 1430    PT Time Calculation (min) 29 min    Equipment Utilized During Treatment Gait belt    Activity Tolerance Patient tolerated treatment well    Behavior During Therapy WFL for tasks assessed/performed             Past Medical History:  Diagnosis Date   Acute encephalopathy 02/12/2018   Alzheimer's dementia (HCC)    CAD (coronary artery disease)    CAP (community acquired pneumonia) 02/11/2018   Carpal tunnel syndrome, bilateral 02/25/2020   Coronary artery disease    Diabetes mellitus    Diverticulitis    Hearing loss    Hypertension    Hypokalemia 02/12/2018   NSTEMI (non-ST elevated myocardial infarction) [facility])    Past Surgical History:  Procedure Laterality Date   ANGIOPLASTY     CORONARY STENT INTERVENTION N/A 02/02/2017   Procedure: Coronary Stent Intervention;  Surgeon: Levern Hutching, MD;  Location: [facility] INVASIVE CV LAB;  Service: Cardiovascular;  Laterality: N/A;   CORONARY STENTS     ESOPHAGOGASTRODUODENOSCOPY (EGD) WITH PROPOFOL  N/A 09/08/2023   Procedure: ESOPHAGOGASTRODUODENOSCOPY (EGD) WITH PROPOFOL ;  Surgeon: Legrand Victory LITTIE DOUGLAS, MD;  Location: [facility] ENDOSCOPY;  Service: Gastroenterology;  Laterality: N/A;   LEFT HEART CATH AND CORONARY ANGIOGRAPHY N/A 02/02/2017   Procedure: Left Heart Cath and Coronary Angiography;  Surgeon: Levern Hutching, MD;  Location: [facility] INVASIVE CV LAB;  Service: Cardiovascular;  Laterality: N/A;   LEFT HEART CATHETERIZATION WITH CORONARY ANGIOGRAM N/A 03/16/2012   Procedure: LEFT HEART CATHETERIZATION WITH CORONARY  ANGIOGRAM;  Surgeon: Hutching LOISE Levern, MD;  Location: [facility] CATH LAB;  Service: Cardiovascular;  Laterality: N/A;   PERCUTANEOUS CORONARY STENT INTERVENTION (PCI-S)  03/16/2012   Procedure: PERCUTANEOUS CORONARY STENT INTERVENTION (PCI-S);  Surgeon: Hutching LOISE Levern, MD;  Location: [facility] CATH LAB;  Service: Cardiovascular;;   TONSILLECTOMY     Patient Active Problem List   Diagnosis Date Noted   Palliative care by specialist 09/11/2023   DNR (do not resuscitate) 09/11/2023   CVA (cerebral vascular accident) (HCC) 09/10/2023   History of CVA 09/04/2023 (cerebrovascular accident) 09/10/2023   History of GI bleed 09/10/2023   Acute CVA (cerebrovascular accident) (HCC) 09/10/2023   Iron  deficiency anemia due to chronic blood loss 09/07/2023   Heme positive stool 09/07/2023   TIA (transient ischemic attack) 09/04/2023   Acute upper GI bleed 09/04/2023   Acute blood loss anemia 09/04/2023   Facial droop 09/04/2023   Weakness of right upper extremity 09/04/2023   Acute kidney injury superimposed on stage 3b chronic kidney disease (HCC) 09/04/2023   Paroxysmal atrial fibrillation (HCC) 09/04/2023   Chronic diastolic CHF (congestive heart failure) (HCC) 09/04/2023   Atrial fibrillation with RVR (HCC) 07/02/2023   NSTEMI, initial episode of care (HCC) 07/02/2023   Exposure to COVID-19 virus 03/23/2023   Dysphagia, unspecified 02/01/2023   Goals of care, counseling/discussion 02/01/2023   Arthralgia 07/05/2022   NSTEMI (non-ST elevated myocardial infarction) (HCC)    Accelerated junctional rhythm  01/30/2022   Dementia without behavioral disturbance (HCC) 01/30/2022   Carpal tunnel syndrome, bilateral 02/25/2020   Numbness 11/28/2019   Raynaud's phenomenon 10/30/2019   CAD (coronary artery disease) 02/12/2018   Left foot drop 02/12/2018   Non-insulin  dependent type 2 diabetes mellitus (HCC) 02/12/2018   Essential hypertension 02/12/2018   Hypokalemia 02/12/2018    ONSET DATE: 2020   REFERRING  DIAG:  I63.00 (ICD-10-CM) - Cerebrovascular accident (CVA) due to thrombosis of precerebral artery (HCC)  R29.898 (ICD-10-CM) - Right hand weakness    THERAPY DIAG:  Muscle weakness (generalized)  Other abnormalities of gait and mobility  Difficulty in walking, not elsewhere classified  Impaired ambulation  Rationale for Evaluation and Treatment: Rehabilitation  SUBJECTIVE:                                                                                                                                                                                             SUBJECTIVE STATEMENT: 02/14/24: Wife, Alexander Donaldson, with patient today. Alexander Donaldson states at home she often provides L HHA for patient to ambulate. Alexander Donaldson states pt's walking is slower now and more imbalanced. Discussed plan of attempting more gait training without an AD today given wife previously reporting pt is resistant to use of RW. Patient continues to present with confusion during session today, but not as significant sundowning, which allowed patient increased participation in session without as much encouragement required from his wife.   Of note from prior sessions:  Pt is hard of hearing, hears better in left ear.   From Eval: Pt presents to clinic in transport chair accompanied by his wife. They say that this decline started in 2020 after a visit to the [place] where he was diagnosed with vascular dementia. Wife reports that pt was independent with ADL for four years until 2024, when he started needing assitance for most activities of daily living d/t cognitive decline. His stroke 3 months ago further exacerbated his B UE numbness and LE weakness, although his wife reports that his LE is now not as weak. Pt cognition is significantly impaired, needing constant redirection and simple instructions. Pt previously used SPC at home until 2024, and has now transitioned to a 2WW. Wife reports she has cameras in her home to monitor patient in case of  falls. Wife reported severe musculature atrophy of thenar eminence.  Pt accompanied by: significant other  PERTINENT HISTORY: Cerebral infarction, unspecified (HCC)  Hypertensive heart and chronic kidney disease with heart failure and stage 1 through stage 4 chronic kidney disease, or unspecified chronic kidney disease (HCC)  Gastrointestinal hemorrhage, unspecified  Chronic kidney disease, unspecified  Paroxysmal  atrial fibrillation (HCC)  Hypokalemia  Chronic combined systolic (congestive) and diastolic (congestive) heart failure (HCC)  Type 2 diabetes mellitus with diabetic chronic kidney disease (HCC)  PAIN:  Are you having pain? No  PRECAUTIONS: Fall  RED FLAGS: None   WEIGHT BEARING RESTRICTIONS: No  FALLS: Has patient fallen in last 6 months? No  LIVING ENVIRONMENT: Lives with: lives with their family and lives with their spouse Lives in: House/apartment Stairs: ramp Has following equipment at home: Vannie - 2 wheeled  PLOF: Independent  PATIENT GOALS: Walking around the house stronger   OBJECTIVE:  Note: Objective measures were completed at Evaluation unless otherwise noted.  DIAGNOSTIC FINDINGS:   Per Ct Head code stroke without contrast on 09/10/23:   The study is moderately degraded by patient motion. The right paramedian pontine infarct is noted.   Moderate generalized atrophy and white matter disease is otherwise stable. The ventricles are proportionate to the degree of atrophy. Chronic encephalomalacia is again noted at the left temporal tip.   The brainstem and cerebellum are otherwise within normal limits. Midline structures are within normal limits.  Vascular: Atherosclerotic calcifications are present within the cavernous internal carotid arteries and at the dural margin of both vertebral arteries. No hyperdense vessel is present.   Skull: Calvarium is intact. No focal lytic or blastic lesions are present. No significant extracranial  soft tissue lesion is present.   Sinuses/Orbits: The paranasal sinuses and mastoid air cells are clear. The globes and orbits are within normal limits.  COGNITION: Overall cognitive status: Impaired     POSTURE: rounded shoulders, forward head, increased thoracic kyphosis, anterior pelvic tilt, and flexed trunk   LOWER EXTREMITY ROM:     WFL   LOWER EXTREMITY MMT:    B hip flexors and knee extensors were observed to move within functional range. Formal MMTs deferred d/t patient not understanding test parameters.    GAIT: Findings: Gait Characteristics: decreased hip/knee flexion- Right, decreased hip/knee flexion- Left, shuffling, trunk flexed, poor foot clearance- Right, and poor foot clearance- Left, Distance walked: 57ft, Assistive device utilized:Walker - 2 wheeled, Level of assistance: CGA, and Comments: CGA-MinA  FUNCTIONAL TESTS:  5 times sit to stand: 40 seconds - Heavy  B UE use  Timed up and go (TUG): 70 seconds  10 meter walk test: 40s  PATIENT SURVEYS:   SIS16 - 29                                                                                                                               TREATMENT DATE: 02/14/24  Unless otherwise stated, CGA/light min A was provided and gait belt donned in order to ensure pt safety throughout session.  Gait training with L HHA transitioned to R HHA 2x150 ft (seated break between) with patient demonstrating minor R lean/LOB bias, impaired R LE gait mechanics due to either weakness and/or pain with lack of stability during R stance (no concern for buckling), slow  gait speed and decreased step lengths although consistent min reciprocal stepping pattern, and minor postural instability.  Attempted to trial leg press machine; patient states that he won't be able to bring BLE's onto surface to complete activity and is resistant to therapist's attempts at cuing him; therefore, discontinued this task.  Stair navigation training  ascending/descending 8 steps (6 height) using B HRs with skilled light min assist for balance - pt able to complete reciprocal pattern in both directions, although decreased safety on descent with pt performing partial sideways technique.   B UE and B LE reciprocal movement pattern on Nustep for cardiovascular training and B UE and LE functional strengthening against the following levels:  Level 1 resistance for total of 6 minutes and 75 steps Initially attempted only B LEs, but pt unable to understand how to perform reciprocal pattern; therefore, had to include B UEs for pt to be successful - requires intermittent min A to sustain the reciprocal movement patterns as he fatigues Goal steps per minute (SPM): N/A. Patient encouraged to reach total target steps at interval of 50 and 70 *pt also continues to demo R inattention with difficulty visually scanning to locate step goal on R side of Nustep screen  Gait training ~44ft back to transport chair using R HHA with light min A.   Therapist educated wife on recommendation to provide him R HHA due to him having R lateral lean/LOB bias as well as to encourage patient having increased R attention.   Pt with only a few brief moments of becoming tearful and emotional throughout session and is more easily redirected to the task at hand today.    PATIENT EDUCATION: Education details: POC Person educated: Patient Education method: Explanation Education comprehension: verbalized understanding   HOME EXERCISE PROGRAM: Patient and wife perform exercises at home already - need to review   GOALS: Goals reviewed with patient? Yes  SHORT TERM GOALS: Target date: 04/03/2024    Pt will show proficiency with HEP to maintain progress made in PT and increase overall QoL Baseline: Not yet given Goal status: INITIAL    LONG TERM GOALS: Target date: 04/03/2024    Pt will increase his SIS-16 score by 10 points to increase overall QoL as it relates to  his CVA. Baseline: 29 Goal status: INITIAL  2.  Pt will decrease TUG time 30 sec or less without deviating from TUG pathway  to indicate reduced fall risk.  Baseline: 70 seconds  Goal status: INITIAL  3.  Pt will decrease 5xSTS time by 15 seconds to improve strength and power and increase overall QoL.  Baseline: 40 seconds  Goal status: INITIAL  4.  Pt will increase his speed on the by 0.25 m/s to decrease his risk of falls and improve his QoL as a limited community Ambulator.  Baseline: 0.25 m/s  Goal status: INITIAL   ASSESSMENT:  CLINICAL IMPRESSION:  Patient continues to present with confusion; however, not exhibiting true sundowning symptoms today, which allows him to have increased participation in session without heavy support/encouragement from his wife. Therapy session focused on gait training with use of HHA as pt's wife states patient is still resistant and inconsistent with use of RW. Patient demos R latera lean/LOB bias during gait; therefore, educated wife on providing R HHA rather than L HHA. This will also be beneficial because patient has R inattention and this will encourage his attention to that side. Patient enjoyed participating in functional strengthening and cardiovascular endurance training on Nustep  today because this is a familiar task to him; however, he does become fatigued from this. Patient continues to demo R LE weakness during gait and will benefit from continued interventions to address this with goal of improving his balance. The pt will benefit from further skilled PT to address balance deficits and R hemiparesis in order to increase QOL, decrease fall risk, and ease/safety with ADLs.   OBJECTIVE IMPAIRMENTS: Abnormal gait, decreased activity tolerance, decreased cognition, decreased endurance, decreased mobility, difficulty walking, decreased strength, impaired UE functional use, and improper body mechanics.   ACTIVITY LIMITATIONS: carrying, lifting,  squatting, stairs, transfers, and bed mobility  PARTICIPATION LIMITATIONS: meal prep, cleaning, laundry, medication management, personal finances, interpersonal relationship, driving, shopping, community activity, and occupation  PERSONAL FACTORS: Age are also affecting patient's functional outcome.   REHAB POTENTIAL: Good  CLINICAL DECISION MAKING: Evolving/moderate complexity  EVALUATION COMPLEXITY: Moderate  PLAN:  PT FREQUENCY: 2x/week  PT DURATION: 12 weeks  PLANNED INTERVENTIONS: 97110-Therapeutic exercises, 97530- Therapeutic activity, 97112- Neuromuscular re-education, 97535- Self Care, and [id number]- Manual therapy  PLAN FOR NEXT SESSION:  - educate wife on [facility] for resources to support her in caring for patient - [email or web address]  - practice hands-on training with wife and education on simple, direct cuing to improve patient's ability to follow her commands and ensure his safety  - initiate HEP if appropriate, wife reports she has exercises they already do, so maybe just review those and advice on whether to change or continue them - R LE strength - Nustep  - standing balance  - R HHA during gait      Catlyn Shipton, PT, DPT, NCS, CSRS Physical Therapist - [place]  [facility]  2:36 PM 02/14/24

## 2024-02-17 NOTE — Therapy (Signed)
 OUTPATIENT OCCUPATIONAL THERAPY TREATMENT NOTE  Patient Name: Alexander Donaldson MRN: [id number] DOB:[birth year], 89 y.o., male Today's Date: 02/17/2024  [facility]P: [facility] REFERRING PROVIDER: Rosemarie Eather RAMAN, MD  END OF SESSION:  OT End of Session - 02/17/24 1915     Visit Number 4    Number of Visits 24    Date for OT Re-Evaluation 04/03/24    OT Start Time 1315    OT Stop Time 1355    OT Time Calculation (min) 40 min    Activity Tolerance Other (comment)   intermittent agitation   Behavior During Therapy [facility] for tasks assessed/performed         Past Medical History:  Diagnosis Date   Acute encephalopathy 02/12/2018   Alzheimer's dementia (HCC)    CAD (coronary artery disease)    CAP (community acquired pneumonia) 02/11/2018   Carpal tunnel syndrome, bilateral 02/25/2020   Coronary artery disease    Diabetes mellitus    Diverticulitis    Hearing loss    Hypertension    Hypokalemia 02/12/2018   NSTEMI (non-ST elevated myocardial infarction) [facility]    Past Surgical History:  Procedure Laterality Date   ANGIOPLASTY     CORONARY STENT INTERVENTION N/A 02/02/2017   Procedure: Coronary Stent Intervention;  Surgeon: Levern Hutching, MD;  Location: [facility] INVASIVE CV LAB;  Service: Cardiovascular;  Laterality: N/A;   CORONARY STENTS     ESOPHAGOGASTRODUODENOSCOPY (EGD) WITH PROPOFOL  N/A 09/08/2023   Procedure: ESOPHAGOGASTRODUODENOSCOPY (EGD) WITH PROPOFOL ;  Surgeon: Legrand Victory LITTIE DOUGLAS, MD;  Location: [facility] ENDOSCOPY;  Service: Gastroenterology;  Laterality: N/A;   LEFT HEART CATH AND CORONARY ANGIOGRAPHY N/A 02/02/2017   Procedure: Left Heart Cath and Coronary Angiography;  Surgeon: Levern Hutching, MD;  Location: [facility] INVASIVE CV LAB;  Service: Cardiovascular;  Laterality: N/A;   LEFT HEART CATHETERIZATION WITH CORONARY ANGIOGRAM N/A 03/16/2012   Procedure: LEFT HEART CATHETERIZATION WITH CORONARY ANGIOGRAM;  Surgeon: Hutching LOISE Levern, MD;  Location: [facility] CATH LAB;  Service:  Cardiovascular;  Laterality: N/A;   PERCUTANEOUS CORONARY STENT INTERVENTION (PCI-S)  03/16/2012   Procedure: PERCUTANEOUS CORONARY STENT INTERVENTION (PCI-S);  Surgeon: Hutching LOISE Levern, MD;  Location: [facility] CATH LAB;  Service: Cardiovascular;;   TONSILLECTOMY     Patient Active Problem List   Diagnosis Date Noted   Palliative care by specialist 09/11/2023   DNR (do not resuscitate) 09/11/2023   CVA (cerebral vascular accident) (HCC) 09/10/2023   History of CVA 09/04/2023 (cerebrovascular accident) 09/10/2023   History of GI bleed 09/10/2023   Acute CVA (cerebrovascular accident) (HCC) 09/10/2023   Iron  deficiency anemia due to chronic blood loss 09/07/2023   Heme positive stool 09/07/2023   TIA (transient ischemic attack) 09/04/2023   Acute upper GI bleed 09/04/2023   Acute blood loss anemia 09/04/2023   Facial droop 09/04/2023   Weakness of right upper extremity 09/04/2023   Acute kidney injury superimposed on stage 3b chronic kidney disease (HCC) 09/04/2023   Paroxysmal atrial fibrillation (HCC) 09/04/2023   Chronic diastolic CHF (congestive heart failure) (HCC) 09/04/2023   Atrial fibrillation with RVR (HCC) 07/02/2023   NSTEMI, initial episode of care (HCC) 07/02/2023   Exposure to COVID-19 virus 03/23/2023   Dysphagia, unspecified 02/01/2023   Goals of care, counseling/discussion 02/01/2023   Arthralgia 07/05/2022   NSTEMI (non-ST elevated myocardial infarction) (HCC)    Accelerated junctional rhythm 01/30/2022   Dementia without behavioral disturbance (HCC) 01/30/2022   Carpal tunnel syndrome, bilateral 02/25/2020   Numbness 11/28/2019   Raynaud's phenomenon  10/30/2019   CAD (coronary artery disease) 02/12/2018   Left foot drop 02/12/2018   Non-insulin  dependent type 2 diabetes mellitus (HCC) 02/12/2018   Essential hypertension 02/12/2018   Hypokalemia 02/12/2018   ONSET DATE: 09/10/2023  REFERRING DIAG: LCVA MCA  THERAPY DIAG:  Muscle weakness (generalized)  Other  lack of coordination  Rationale for Evaluation and Treatment: Rehabilitation  SUBJECTIVE:  SUBJECTIVE STATEMENT: Spouse reports that she sees pt using his R hand more when he eats.  Pt accompanied by: self and significant other  PERTINENT HISTORY: Pt. Had recent onset of L CVA MCA due to Afib. Pt. PMHx includes HTN, CKD IIIb, heart failure, dementia, and an Ischemic Stroke.   PRECAUTIONS: None  WEIGHT BEARING RESTRICTIONS: No  PAIN:  Are you having pain? No  FALLS: Has patient fallen in last 6 months? No  LIVING ENVIRONMENT: Lives with: lives with their family and lives with their spouse Lives in: House/apartment, 1 level Stairs:  Yes, but uses ramp. Has following equipment at home: Vannie, rollator, w/c to go out into community, shower chair, grab bars, raised toilet seat  PLOF: Independent  PATIENT GOALS: Strengthen RUE  OBJECTIVE:  Note: Objective measures were completed at Evaluation unless otherwise noted.  HAND DOMINANCE: Right  ADLs: Overall ADLs: Pt. Is unable to initiate ADL tasks.  Transfers/ambulation related to ADLs: uses w/c when wife knows they will be out long.  Eating: Requires Set Up assistance in the initiation of feeding, and requires assistance from caregivers to feed him. Able to feed himself, however, R hand pronates more due to weakness.  Grooming: Wife takes dentures and wife cleans them. Does not initiate task.  UB Dressing: Requires assistance to complete ADL tasks.  LB Dressing: Is able to put shoes on, does not typically initiate task. Uses slip on sneakers, and is able to push foot through.  Toileting: requires 2+ assistance to complete toileting tasks.  Bathing: attempts to wash his face and body Tub Shower transfers: Uses walk in shower and shower chair.  Equipment: Shower seat with back, Grab bars, and bed side commode Work: used to work as Curator Hobbies: singing IADLs: Shopping: Wife typically does the shopping Light housekeeping:  Wife typically does this task. Meal Prep: Public house manager mobility:  Medication management: Wife takes care of Meds Financial management:  Handwriting: TBD  MOBILITY STATUS: Pt. Caregiver states that if they go out for a long outing, Pt. Will use w/c to get around stores.   POSTURE COMMENTS:  No Significant postural limitations Sitting balance: Good  ACTIVITY TOLERANCE: Activity tolerance:   FUNCTIONAL OUTCOME MEASURES:   UPPER EXTREMITY ROM:    Active ROM Right eval Left eval  Shoulder flexion 92(105) 90 (130)  Shoulder abduction 78(110) 80(94)  Shoulder adduction    Shoulder extension    Shoulder internal rotation    Shoulder external rotation    Elbow flexion 132(142) 132(140)  Elbow extension -25(-16) -10 (-5)  Wrist flexion 40 (58) 10(48)  Wrist extension 30(38) 12(38)  Wrist ulnar deviation    Wrist radial deviation    Wrist pronation    Wrist supination    (Blank rows = not tested)  UPPER EXTREMITY MMT:   TBD  MMT Right eval Left eval  Shoulder flexion    Shoulder abduction    Shoulder adduction    Shoulder extension    Shoulder internal rotation    Shoulder external rotation    Middle trapezius    Lower trapezius    Elbow flexion  Elbow extension    Wrist flexion    Wrist extension    Wrist ulnar deviation    Wrist radial deviation    Wrist pronation    Wrist supination    (Blank rows = not tested)  HAND FUNCTION: TBD  COORDINATION: TBD  SENSATION:  EDEMA:   MUSCLE TONE:   COGNITION: Overall cognitive status: Impaired  VISION: Subjective report: Pt. Caregiver reports changes in vision since recent L MCA CVA. Baseline vision: Wears glasses all the time Visual history:   VISION ASSESSMENT: Not tested  Patient has difficulty with following activities due to following visual impairments:   PRAXIS: Impaired: Initiation, motor planning                                                                                                                             TREATMENT DATE: 02/01/2024   Therapeutic Activities:  Participation in tasks focusing on functional use with the right hand: -Facilitated [facility] skills while flipping through a magazine, using R hand to turn pages, and bilateral hand coordination to manipulate and separate pages.  -R hand grasp/release against light resistance of velcro board to move 1 blocks on/off board; assist to anchor L hand to avoid compensation and initiate task with the R -RUE forward and lateral reaching patterns and grasp/release, moving Saebo rings between levels 1-2 of Saebo tower; min A -Dowel climb to facilitate BUE coordination and R shoulder active assisted flexion x4 reps, min A to engage the RUE and to maximize end ROM.  PATIENT EDUCATION: Education details: caregiver assist to reduce compensation (caregiver held pt's L hand to help promote use of the R hand) Person educated: Patient and Caregiver [name] Education method: Explanation, Demonstration, Tactile cues, and Verbal cues Education comprehension: verbalized understanding, returned demonstration, verbal cues required, tactile cues required, and needs further education  HOME EXERCISE PROGRAM: Engaging R hand into daily tasks  GOALS: Goals reviewed with patient? Yes  SHORT TERM GOALS: Target date: 02/21/2024     Pt. Will perform HEPs with  Mod A and Mod cuing. Baseline: Eval: no current HEP Goal status: INITIAL   LONG TERM GOALS: Target date: 04/03/2024    Pt. Will complete shaving using his R hand with Supervision with Min cuing using an Neurosurgeon. Baseline:  Eval: Max A and Max cues to complete shaving. Goal status: INITIAL  2.  Pt. Will use his R hand to wash his face with Supervision with Min cuing to initiate the task. Baseline:  Eval: Max A and Max cues to wash face. Goal status: INITIAL  3.  Pt. Will initiate using his R hand to flip through a magazine independently with Min cues.   Baseline: Eval: Max A and Max cues to initiate using his R hand.  Goal status: INITIAL  4.  Pt. Will increase BUE shoulder ROM by 10 degrees to improve functional reaching tasks to elevated surfaces.  Baseline: Eval: Shoulder Felxion: R: 92(105), L: 90(130), Abduction:  R: 78(110), L: 80(94) Goal status: INITIAL  5.  Pt. Will improve R hand [facility] skills to be able to independently manipulate small objects during ADLs/IADLs. Baseline: Eval: Pt. Has difficulty using the R hand to manipulate small objects during daily care tasks. Goal status: INITIAL    ASSESSMENT:  CLINICAL IMPRESSION: Fair tolerance to tx activities noted above.  Pt did experience intermittent agitation with repeated attempts from therapist and spouse for pt to engage the R hand instead of the L.  Pt tolerated having his L hand held by OT or spouse for only brief periods, and required frequent task changes to reduce agitation.  Spouse does report that pt continues to enjoy turning pages of his magazines at home, and can tolerate this activity for about an hour every day.  This activity was chosen today, but pt did not respond well to prompts to engage the R hand while turning pages.  Spouse reports that he does use the R hand some at home to turn pages, and she's observing pt using his R hand more while self feeding.  Pt. will continue to benefit from OT services to work on initiating desired occupations, increasing [facility] skills and BUE shoulder ROM while performing ADL/IADL tasks.   PERFORMANCE DEFICITS: in functional skills including ADLs, IADLs, coordination, dexterity, ROM, strength, pain, Fine motor control, Gross motor control, vision, and UE functional use, cognitive skills including attention, problem solving, safety awareness, sequencing, thought, and understand, and psychosocial skills including environmental adaptation, habits, interpersonal interactions, and routines and behaviors.   IMPAIRMENTS: are limiting patient from  ADLs, IADLs, leisure, and social participation.   CO-MORBIDITIES: may have co-morbidities  that affects occupational performance. Patient will benefit from skilled OT to address above impairments and improve overall function.  MODIFICATION OR ASSISTANCE TO COMPLETE EVALUATION: Min-Moderate modification of tasks or assist with assess necessary to complete an evaluation.  OT OCCUPATIONAL PROFILE AND HISTORY: Detailed assessment: Review of records and additional review of physical, cognitive, psychosocial history related to current functional performance.  CLINICAL DECISION MAKING: Moderate - several treatment options, min-mod task modification necessary  REHAB POTENTIAL: Good  EVALUATION COMPLEXITY: Moderate    PLAN:  OT FREQUENCY: 2x/week  OT DURATION: 12 weeks  PLANNED INTERVENTIONS: 97168 OT Re-evaluation, 97535 self care/ADL training, 02889 therapeutic exercise, 97530 therapeutic activity, 97112 neuromuscular re-education, 97140 manual therapy, passive range of motion, visual/perceptual remediation/compensation, patient/family education, and DME and/or AE instructions  RECOMMENDED OTHER SERVICES: ST & PT  CONSULTED AND AGREED WITH PLAN OF CARE: Patient and family member/caregiver  PLAN FOR NEXT SESSION: Treatment  Inocente Blazing, MS, OTR/L  02/17/2024, 7:16 PM

## 2024-02-21 ENCOUNTER — Ambulatory Visit: Admitting: Physical Therapy

## 2024-02-21 ENCOUNTER — Ambulatory Visit

## 2024-02-21 DIAGNOSIS — R2689 Other abnormalities of gait and mobility: Secondary | ICD-10-CM | POA: Diagnosis not present

## 2024-02-21 DIAGNOSIS — M6281 Muscle weakness (generalized): Secondary | ICD-10-CM

## 2024-02-21 DIAGNOSIS — R278 Other lack of coordination: Secondary | ICD-10-CM

## 2024-02-21 DIAGNOSIS — R262 Difficulty in walking, not elsewhere classified: Secondary | ICD-10-CM

## 2024-02-21 NOTE — Therapy (Signed)
 OUTPATIENT PHYSICAL THERAPY NEURO TREATMENT   Patient Name: Alexander Donaldson MRN: [id number] DOB:11-Nov-1933, 89 y.o., male Today's Date: 02/21/2024   PCP: [facility]  REFERRING PROVIDER: Eather Popp, MD  END OF SESSION:   PT End of Session - 02/21/24 1406     Visit Number 5    Number of Visits 24    Date for PT Re-Evaluation 04/03/24    Progress Note Due on Visit 10    PT Start Time 1406    PT Stop Time 1446    PT Time Calculation (min) 40 min    Equipment Utilized During Treatment Gait belt    Activity Tolerance Patient tolerated treatment well    Behavior During Therapy [facility] for tasks assessed/performed           Past Medical History:  Diagnosis Date   Acute encephalopathy 02/12/2018   Alzheimer's dementia (HCC)    CAD (coronary artery disease)    CAP (community acquired pneumonia) 02/11/2018   Carpal tunnel syndrome, bilateral 02/25/2020   Coronary artery disease    Diabetes mellitus    Diverticulitis    Hearing loss    Hypertension    Hypokalemia 02/12/2018   NSTEMI (non-ST elevated myocardial infarction) [facility])    Past Surgical History:  Procedure Laterality Date   ANGIOPLASTY     CORONARY STENT INTERVENTION N/A 02/02/2017   Procedure: Coronary Stent Intervention;  Surgeon: Levern Hutching, MD;  Location: [facility] INVASIVE CV LAB;  Service: Cardiovascular;  Laterality: N/A;   CORONARY STENTS     ESOPHAGOGASTRODUODENOSCOPY (EGD) WITH PROPOFOL  N/A 09/08/2023   Procedure: ESOPHAGOGASTRODUODENOSCOPY (EGD) WITH PROPOFOL ;  Surgeon: Legrand Victory LITTIE DOUGLAS, MD;  Location: [facility] ENDOSCOPY;  Service: Gastroenterology;  Laterality: N/A;   LEFT HEART CATH AND CORONARY ANGIOGRAPHY N/A 02/02/2017   Procedure: Left Heart Cath and Coronary Angiography;  Surgeon: Levern Hutching, MD;  Location: [facility] INVASIVE CV LAB;  Service: Cardiovascular;  Laterality: N/A;   LEFT HEART CATHETERIZATION WITH CORONARY ANGIOGRAM N/A 03/16/2012   Procedure: LEFT HEART CATHETERIZATION WITH CORONARY  ANGIOGRAM;  Surgeon: Hutching LOISE Levern, MD;  Location: [facility] CATH LAB;  Service: Cardiovascular;  Laterality: N/A;   PERCUTANEOUS CORONARY STENT INTERVENTION (PCI-S)  03/16/2012   Procedure: PERCUTANEOUS CORONARY STENT INTERVENTION (PCI-S);  Surgeon: Hutching LOISE Levern, MD;  Location: [facility] CATH LAB;  Service: Cardiovascular;;   TONSILLECTOMY     Patient Active Problem List   Diagnosis Date Noted   Palliative care by specialist 09/11/2023   DNR (do not resuscitate) 09/11/2023   CVA (cerebral vascular accident) (HCC) 09/10/2023   History of CVA 09/04/2023 (cerebrovascular accident) 09/10/2023   History of GI bleed 09/10/2023   Acute CVA (cerebrovascular accident) (HCC) 09/10/2023   Iron  deficiency anemia due to chronic blood loss 09/07/2023   Heme positive stool 09/07/2023   TIA (transient ischemic attack) 09/04/2023   Acute upper GI bleed 09/04/2023   Acute blood loss anemia 09/04/2023   Facial droop 09/04/2023   Weakness of right upper extremity 09/04/2023   Acute kidney injury superimposed on stage 3b chronic kidney disease (HCC) 09/04/2023   Paroxysmal atrial fibrillation (HCC) 09/04/2023   Chronic diastolic CHF (congestive heart failure) (HCC) 09/04/2023   Atrial fibrillation with RVR (HCC) 07/02/2023   NSTEMI, initial episode of care (HCC) 07/02/2023   Exposure to COVID-19 virus 03/23/2023   Dysphagia, unspecified 02/01/2023   Goals of care, counseling/discussion 02/01/2023   Arthralgia 07/05/2022   NSTEMI (non-ST elevated myocardial infarction) (HCC)    Accelerated junctional rhythm 01/30/2022  Dementia without behavioral disturbance (HCC) 01/30/2022   Carpal tunnel syndrome, bilateral 02/25/2020   Numbness 11/28/2019   Raynaud's phenomenon 10/30/2019   CAD (coronary artery disease) 02/12/2018   Left foot drop 02/12/2018   Non-insulin  dependent type 2 diabetes mellitus (HCC) 02/12/2018   Essential hypertension 02/12/2018   Hypokalemia 02/12/2018    ONSET DATE: 2020   REFERRING  DIAG:  I63.00 (ICD-10-CM) - Cerebrovascular accident (CVA) due to thrombosis of precerebral artery (HCC)  R29.898 (ICD-10-CM) - Right hand weakness    THERAPY DIAG:  Muscle weakness (generalized)  Other abnormalities of gait and mobility  Difficulty in walking, not elsewhere classified  Impaired ambulation  Other lack of coordination  Rationale for Evaluation and Treatment: Rehabilitation  SUBJECTIVE:                                                                                                                                                                                             SUBJECTIVE STATEMENT: 02/21/24: Wife, [name], with patient today. [name] stating that she has been trying to sit on his right side throughout the day, especially during meals. Patient continues to present with confusion during session today, but not as significant sundowning, which allowed patient increased participation in session without as much encouragement required from his wife.   Of note from prior sessions:  Pt is hard of hearing, hears better in left ear.   From Eval: Pt presents to clinic in transport chair accompanied by his wife. They say that this decline started in 2020 after a visit to the [place] where he was diagnosed with vascular dementia. Wife reports that pt was independent with ADL for four years until 2024, when he started needing assitance for most activities of daily living d/t cognitive decline. His stroke 3 months ago further exacerbated his B UE numbness and LE weakness, although his wife reports that his LE is now not as weak. Pt cognition is significantly impaired, needing constant redirection and simple instructions. Pt previously used SPC at home until 2024, and has now transitioned to a 2WW. Wife reports she has cameras in her home to monitor patient in case of falls. Wife reported severe musculature atrophy of thenar eminence.  Pt accompanied by: significant other  PERTINENT  HISTORY: Cerebral infarction, unspecified (HCC)  Hypertensive heart and chronic kidney disease with heart failure and stage 1 through stage 4 chronic kidney disease, or unspecified chronic kidney disease (HCC)  Gastrointestinal hemorrhage, unspecified  Chronic kidney disease, unspecified  Paroxysmal atrial fibrillation (HCC)  Hypokalemia  Chronic combined systolic (congestive) and diastolic (congestive) heart failure (HCC)  Type 2 diabetes mellitus with diabetic chronic  kidney disease (HCC)  PAIN:  Are you having pain? No  PRECAUTIONS: Fall  RED FLAGS: None   WEIGHT BEARING RESTRICTIONS: No  FALLS: Has patient fallen in last 6 months? No  LIVING ENVIRONMENT: Lives with: lives with their family and lives with their spouse Lives in: House/apartment Stairs: ramp Has following equipment at home: Vannie - 2 wheeled  PLOF: Independent  PATIENT GOALS: Walking around the house stronger   OBJECTIVE:  Note: Objective measures were completed at Evaluation unless otherwise noted.  DIAGNOSTIC FINDINGS:   Per Ct Head code stroke without contrast on 09/10/23:   The study is moderately degraded by patient motion. The right paramedian pontine infarct is noted.   Moderate generalized atrophy and white matter disease is otherwise stable. The ventricles are proportionate to the degree of atrophy. Chronic encephalomalacia is again noted at the left temporal tip.   The brainstem and cerebellum are otherwise within normal limits. Midline structures are within normal limits.  Vascular: Atherosclerotic calcifications are present within the cavernous internal carotid arteries and at the dural margin of both vertebral arteries. No hyperdense vessel is present.   Skull: Calvarium is intact. No focal lytic or blastic lesions are present. No significant extracranial soft tissue lesion is present.   Sinuses/Orbits: The paranasal sinuses and mastoid air cells are clear. The globes and  orbits are within normal limits.  COGNITION: Overall cognitive status: Impaired     POSTURE: rounded shoulders, forward head, increased thoracic kyphosis, anterior pelvic tilt, and flexed trunk   LOWER EXTREMITY ROM:     WFL   LOWER EXTREMITY MMT:    B hip flexors and knee extensors were observed to move within functional range. Formal MMTs deferred d/t patient not understanding test parameters.    GAIT: Findings: Gait Characteristics: decreased hip/knee flexion- Right, decreased hip/knee flexion- Left, shuffling, trunk flexed, poor foot clearance- Right, and poor foot clearance- Left, Distance walked: 68ft, Assistive device utilized:Walker - 2 wheeled, Level of assistance: CGA, and Comments: CGA-MinA  FUNCTIONAL TESTS:  5 times sit to stand: 40 seconds - Heavy  B UE use  Timed up and go (TUG): 70 seconds  10 meter walk test: 40s  PATIENT SURVEYS:   SIS16 - 29                                                                                                                               TREATMENT DATE: 02/21/24  Provided education to wife on:  Dementia Collaborative for resources to support her in caring for patient - [employer] [facility] [place] Pt's wife reports she has found other local resources to support her in caring for her husband  Gait training ~572ft with HHA on R side; promoting scanning to R side of environment & completing R turns as well as targeting gait endurance Patient reporting back pain after ~480ft; wife reporting that he often experiences LBP with long walks   Attempted dynamic standing balance task  of removing post-it notes from various heights on a mirror; patient resistant to trial  Completed 2x repetitions, requesting to terminate activity  Dynamic standing balance task of alternating foot taps to 2 Blaze pods on brown step. Set on sequence with 1.5 sec delay to allow pt time to place foot back on ground before  other light goes off Tolerated with max encouragement from both therapist and his wife to sustain attention to task Pt primarily tapping targets with R foot, but with max cuing would tolerate alternating foot taps for ~5 reps in a row Light min A for balance during and using B UE support on mat table   PATIENT EDUCATION: Education details: POC Person educated: Patient Education method: Explanation Education comprehension: verbalized understanding   HOME EXERCISE PROGRAM: Patient and wife perform exercises at home already - need to review   GOALS: Goals reviewed with patient? Yes  SHORT TERM GOALS: Target date: 04/03/2024    Pt will show proficiency with HEP to maintain progress made in PT and increase overall QoL Baseline: Not yet given Goal status: INITIAL    LONG TERM GOALS: Target date: 04/03/2024    Pt will increase his SIS-16 score by 10 points to increase overall QoL as it relates to his CVA. Baseline: 29 Goal status: INITIAL  2.  Pt will decrease TUG time 30 sec or less without deviating from TUG pathway  to indicate reduced fall risk.  Baseline: 70 seconds  Goal status: INITIAL  3.  Pt will decrease 5xSTS time by 15 seconds to improve strength and power and increase overall QoL.  Baseline: 40 seconds  Goal status: INITIAL  4.  Pt will increase his speed on the by 0.25 m/s to decrease his risk of falls and improve his QoL as a limited community Ambulator.  Baseline: 0.25 m/s  Goal status: INITIAL   ASSESSMENT:  CLINICAL IMPRESSION:  Patient continues to present with confusion; however, not exhibiting true sundowning symptoms today, which allows him to have increased participation in session without heavy support/encouragement from his wife until he becomes fatigued. Therapy session focused on gait training with use of R HHA while targeting gait endurance as well as R inattention to promote visual scanning and turning towards his R (requires max  cuing to attend to that side). Patient with significant fatigue after longer gait distance and would not recommend repeating that at next session. Due to fatigue following long gait endurance, pt slightly more resistant to more cognitively involved interventions. Patient was tolerant to participating in dynamic foot tapping task to promote single leg stance and functional strengthening, but required max encouragement from his wife to continue the task. Provided patient's wife information on resources for caregiver support when caring for someone with dementia. Pt's wife reports feeling she has adequate local support. The pt will benefit from further skilled PT to address balance deficits and R hemiparesis in order to increase QOL, decrease fall risk, and ease/safety with ADLs.   OBJECTIVE IMPAIRMENTS: Abnormal gait, decreased activity tolerance, decreased cognition, decreased endurance, decreased mobility, difficulty walking, decreased strength, impaired UE functional use, and improper body mechanics.   ACTIVITY LIMITATIONS: carrying, lifting, squatting, stairs, transfers, and bed mobility  PARTICIPATION LIMITATIONS: meal prep, cleaning, laundry, medication management, personal finances, interpersonal relationship, driving, shopping, community activity, and occupation  PERSONAL FACTORS: Age are also affecting patient's functional outcome.   REHAB POTENTIAL: Good  CLINICAL DECISION MAKING: Evolving/moderate complexity  EVALUATION COMPLEXITY: Moderate  PLAN:  PT FREQUENCY: 2x/week  PT DURATION: 12 weeks  PLANNED INTERVENTIONS: 97110-Therapeutic exercises, 97530- Therapeutic activity, 97112- Neuromuscular re-education, 97535- Self Care, and [id number]- Manual therapy  PLAN FOR NEXT SESSION:  - practice hands-on training with wife and education on simple, direct cuing to improve patient's ability to follow her commands and ensure his safety  - initiate HEP if appropriate, wife reports she has  exercises they already do, so maybe just review those and advice on whether to change or continue them - R LE strength - Nustep  - standing balance  - R HHA during gait  - stair navigation for functional strength - add AW resistance for strengthening when appropriate     Caitlain Tweed, PT, DPT, NCS, CSRS Physical Therapist - [place]  [facility]  2:50 PM 02/21/24

## 2024-02-22 ENCOUNTER — Ambulatory Visit (INDEPENDENT_AMBULATORY_CARE_PROVIDER_SITE_OTHER): Payer: No Typology Code available for payment source

## 2024-02-22 VITALS — Ht 64.0 in | Wt 149.0 lb

## 2024-02-22 DIAGNOSIS — Z0000 Encounter for general adult medical examination without abnormal findings: Secondary | ICD-10-CM

## 2024-02-22 NOTE — Patient Instructions (Signed)
 [name] , Thank you for taking time out of your busy schedule to complete your Annual Wellness Visit with me. I enjoyed our conversation and look forward to speaking with you again next year. I, as well as your care team,  appreciate your ongoing commitment to your health goals. Please review the following plan we discussed and let me know if I can assist you in the future. Your Game plan/ To Do List    Referrals: If you haven't heard from the office you've been referred to, please reach out to them at the phone provided.   Follow up Visits: We will see or speak with you next year for your Next Medicare AWV with our clinical staff-02/14/25 @ 2:20pm Have you seen your provider in the last 6 months (3 months if uncontrolled diabetes)? Yes  Clinician Recommendations:  Aim for 30 minutes of exercise or brisk walking, 6-8 glasses of water, and 5 servings of fruits and vegetables each day.       This is a list of the screenings recommended for you:  Health Maintenance  Topic Date Due   DTaP/Tdap/Td vaccine (2 - Td or Tdap) 11/22/2021   Eye exam for diabetics  09/21/2023   COVID-19 Vaccine (9 - 2024-25 season) 11/20/2023   Flu Shot  02/09/2024   Hemoglobin A1C  03/03/2024   Complete foot exam   11/12/2024   Pneumococcal Vaccine for age over 50  Completed   Zoster (Shingles) Vaccine  Completed   HPV Vaccine  Aged Out   Meningitis B Vaccine  Aged Out   Pneumococcal Vaccine  Discontinued    Advanced directives: (In Chart) A copy of your advanced directives are scanned into your chart should your provider ever need it. Advance Care Planning is important because it:  [x]  Makes sure you receive the medical care that is consistent with your values, goals, and preferences  [x]  It provides guidance to your family and loved ones and reduces their decisional burden about whether or not they are making the right decisions based on your wishes.  Follow the link provided in your after visit summary  or read over the paperwork we have mailed to you to help you started getting your Advance Directives in place. If you need assistance in completing these, please reach out to us  so that we can help you!

## 2024-02-22 NOTE — Progress Notes (Signed)
 Subjective:   Alexander Donaldson is a 89 y.o. who presents for a Medicare Wellness preventive visit.  As a reminder, Annual Wellness Visits don't include a physical exam, and some assessments may be limited, especially if this visit is performed virtually. We may recommend an in-person follow-up visit with your provider if needed.  Visit Complete: Virtual I connected with  Alexander Donaldson Search and wife, [name] on 02/22/24 by a audio enabled telemedicine application and verified that I am speaking with the correct person using two identifiers.  Patient Location: Home  Provider Location: Home Office  I discussed the limitations of evaluation and management by telemedicine. The patient expressed understanding and agreed to proceed.  Vital Signs: Because this visit was a virtual/telehealth visit, some criteria may be missing or patient reported. Any vitals not documented were not able to be obtained and vitals that have been documented are patient reported.  VideoDeclined- This patient declined Librarian, academic. Therefore the visit was completed with audio only.  Persons Participating in Visit: Patient.and wife , Copy  AWV Questionnaire: No: Patient Medicare AWV questionnaire was not completed prior to this visit.  Cardiac Risk Factors include: advanced age (>11men, >54 women);diabetes mellitus;dyslipidemia;hypertension;male gender;sedentary lifestyle     Objective:    Today's Vitals   02/22/24 1415  Weight: 149 lb (67.6 kg)  Height: 5' 4 (1.626 m)   Body mass index is 25.58 kg/m.     02/22/2024    2:27 PM 01/10/2024    2:04 PM 09/11/2023    6:18 PM 09/08/2023    7:27 AM 09/04/2023    1:48 PM 07/02/2023    4:14 PM 07/02/2023    9:53 AM  Advanced Directives  Does Patient Have a Medical Advance Directive? Yes Yes Yes Yes No No Unable to assess, patient is non-responsive or altered mental status  Type of Public librarian Power of  [place];Living will  Healthcare Power of [place];Living will Healthcare Power of [place];Living will     Does patient want to make changes to medical advance directive?   No - Guardian declined      Copy of Healthcare Power of Attorney in Chart? Yes - validated most recent copy scanned in chart (See row information)  Yes - validated most recent copy scanned in chart (See row information) Yes - validated most recent copy scanned in chart (See row information)     Would patient like information on creating a medical advance directive?   No - Guardian declined  No - Patient declined No - Guardian declined     Current Medications (verified) Outpatient Encounter Medications as of 02/22/2024  Medication Sig   acetaminophen  (TYLENOL ) 325 MG tablet Take 2 tablets (650 mg total) by mouth every 4 (four) hours as needed for mild pain (pain score 1-3) (or temp > 37.5 C (99.5 F)).   amiodarone  (PACERONE ) 200 MG tablet Take 1 tablet (200 mg total) by mouth daily.   amLODIPine  Benzoate 1 MG/ML SUSP Take 10 mg by mouth 2 (two) times daily.   apixaban  (ELIQUIS ) 5 MG TABS tablet Take 1 tablet (5 mg total) by mouth 2 (two) times daily.   Atorvastatin  Calcium  20 MG/5ML SUSP Take 20 mg by mouth daily.   Memantine  HCl 10 MG/5ML SOLN Take 5 mLs by mouth 2 (two) times daily at 10 AM and 5 PM.   Multiple Vitamin (MULTIVITAMIN) tablet Take 1 tablet by mouth daily with breakfast.   nitroGLYCERIN  (NITROSTAT ) 0.4 MG SL tablet Place 1  tablet (0.4 mg total) under the tongue every 5 (five) minutes x 3 doses as needed for chest pain.   polyethylene glycol (MIRALAX  / GLYCOLAX ) 17 g packet Take 17 g by mouth in the morning.   No facility-administered encounter medications on file as of 02/22/2024.    Allergies (verified) Lisinopril and Metformin    History: Past Medical History:  Diagnosis Date   Acute encephalopathy 02/12/2018   Alzheimer's dementia (HCC)    CAD (coronary artery disease)    CAP (community acquired  pneumonia) 02/11/2018   Carpal tunnel syndrome, bilateral 02/25/2020   Coronary artery disease    Diabetes mellitus    Diverticulitis    Hearing loss    Hypertension    Hypokalemia 02/12/2018   NSTEMI (non-ST elevated myocardial infarction) [facility])    Past Surgical History:  Procedure Laterality Date   ANGIOPLASTY     CORONARY STENT INTERVENTION N/A 02/02/2017   Procedure: Coronary Stent Intervention;  Surgeon: Levern Hutching, MD;  Location: [facility] INVASIVE CV LAB;  Service: Cardiovascular;  Laterality: N/A;   CORONARY STENTS     ESOPHAGOGASTRODUODENOSCOPY (EGD) WITH PROPOFOL  N/A 09/08/2023   Procedure: ESOPHAGOGASTRODUODENOSCOPY (EGD) WITH PROPOFOL ;  Surgeon: Legrand Victory Donaldson DOUGLAS, MD;  Location: [facility] ENDOSCOPY;  Service: Gastroenterology;  Laterality: N/A;   LEFT HEART CATH AND CORONARY ANGIOGRAPHY N/A 02/02/2017   Procedure: Left Heart Cath and Coronary Angiography;  Surgeon: Levern Hutching, MD;  Location: [facility] INVASIVE CV LAB;  Service: Cardiovascular;  Laterality: N/A;   LEFT HEART CATHETERIZATION WITH CORONARY ANGIOGRAM N/A 03/16/2012   Procedure: LEFT HEART CATHETERIZATION WITH CORONARY ANGIOGRAM;  Surgeon: Hutching LOISE Levern, MD;  Location: [facility] CATH LAB;  Service: Cardiovascular;  Laterality: N/A;   PERCUTANEOUS CORONARY STENT INTERVENTION (PCI-S)  03/16/2012   Procedure: PERCUTANEOUS CORONARY STENT INTERVENTION (PCI-S);  Surgeon: Hutching LOISE Levern, MD;  Location: [facility] CATH LAB;  Service: Cardiovascular;;   TONSILLECTOMY     History reviewed. No pertinent family history. Social History   Socioeconomic History   Marital status: Married    Spouse name: [name]   Number of children: Not on file   Years of education: Not on file   Highest education level: Not on file  Occupational History   Not on file  Tobacco Use   Smoking status: Never   Smokeless tobacco: Never  Vaping Use   Vaping status: Never Used  Substance and Sexual Activity   Alcohol use: No    Comment: social   Drug use: No   Sexual  activity: Yes    Birth control/protection: None  Other Topics Concern   Not on file  Social History Narrative   Lives with wife   Right Handed   Drinks 1-2 cups caffeine daily   Social Drivers of Health   Financial Resource Strain: Low Risk  (02/22/2024)   Overall Financial Resource Strain (CARDIA)    Difficulty of Paying Living Expenses: Not hard at all  Food Insecurity: No Food Insecurity (02/22/2024)   Hunger Vital Sign    Worried About Running Out of Food in the Last Year: Never true    Ran Out of Food in the Last Year: Never true  Transportation Needs: No Transportation Needs (02/22/2024)   PRAPARE - Administrator, Civil Service (Medical): No    Lack of Transportation (Non-Medical): No  Physical Activity: Insufficiently Active (02/22/2024)   Exercise Vital Sign    Days of Exercise per Week: 1 day    Minutes of Exercise per Session: 20 min  Stress:  No Stress Concern Present (02/22/2024)   [employer] of Occupational Health - Occupational Stress Questionnaire    Feeling of Stress: Not at all  Social Connections: Socially Isolated (02/22/2024)   Social Connection and Isolation Panel    Frequency of Communication with Friends and Family: Once a week    Frequency of Social Gatherings with Friends and Family: Never    Attends Religious Services: Never    Diplomatic Services operational officer: No    Attends Engineer, structural: Never    Marital Status: Married    Tobacco Counseling Counseling given: Not Answered  Clinical Intake:  Pre-visit preparation completed: Yes  Pain : No/denies pain    BMI - recorded: 25.58 Nutritional Status: BMI 25 -29 Overweight Nutritional Risks: None Diabetes: Yes CBG done?: Yes (runs 130) CBG resulted in Enter/ Edit results?: No Did pt. bring in CBG monitor from home?: No  Lab Results  Component Value Date   HGBA1C 5.9 (H) 09/04/2023   HGBA1C 7.2 (H) 01/30/2022   HGBA1C 6.6 12/30/2020     How often do  you need to have someone help you when you read instructions, pamphlets, or other written materials from your doctor or pharmacy?: 1 - Never  Interpreter Needed?: No  Comments: lives with wife Information entered by :: B.Kemiyah Tarazon,LPN   Activities of Daily Living     02/22/2024    2:27 PM 09/11/2023    6:18 PM  In your present state of health, do you have any difficulty performing the following activities:  Hearing? 0 1  Comment  has hearing aids  Vision? 0 1  Comment  has glasses  Difficulty concentrating or making decisions? 1 1  Walking or climbing stairs? 1   Dressing or bathing? 1   Doing errands, shopping? 1   Preparing Food and eating ? Y   Using the Toilet? Y   In the past six months, have you accidently leaked urine? Y   Do you have problems with loss of bowel control? Y   Managing your Medications? Y   Managing your Finances? Y   Housekeeping or managing your Housekeeping? Y     Patient Care Team: Jonda Rush, MD as PCP - [facility] (Ophthalmology) Gaynel Delon CROME, DPM as Consulting Physician (Podiatry) Jonda Rush, MD as Referring Physician [facility]  I have updated your Care Teams any recent Medical Services you may have received from other providers in the past year.     Assessment:   This is a routine wellness examination for [facility].  Hearing/Vision screen Hearing Screening - Comments:: Pt says his hearing is little in lft ear;none in rt side;pulls hearing aids out Vision Screening - Comments:: Pt wife says his vision is alright w/glasses [facility]   Goals Addressed             This Visit's Progress    Patient Stated   Not on track    02/22/24-To feel better and get stronger       Depression Screen     02/22/2024    2:25 PM 07/02/2023    8:29 PM 02/20/2023   11:15 AM 12/30/2021    3:24 PM 03/15/2017    1:03 PM 04/23/2012    9:12 AM  PHQ 2/9 Scores  PHQ - 2 Score 0 0  0 0 0   Exception Documentation   Medical reason         Data saved with a previous flowsheet row definition  Fall Risk     02/22/2024    2:20 PM 02/28/2023   12:33 PM 02/20/2023   11:12 AM 12/30/2021    3:23 PM 03/07/2017    8:50 AM  Fall Risk   Falls in the past year? 0 1 1 1  No   Comment    tripped over door jam   Number falls in past yr: 0 1 1 1    Injury with Fall? 0 1 1 0   Risk for fall due to : No Fall Risks Impaired balance/gait;Impaired mobility Impaired balance/gait;Orthopedic patient;Impaired mobility Impaired balance/gait;Impaired mobility;Medication side effect Impaired mobility;Mental status change;History of fall(s)   Follow up Education provided;Falls prevention discussed Falls prevention discussed Education provided;Falls prevention discussed;Falls evaluation completed Falls evaluation completed;Education provided;Falls prevention discussed       Data saved with a previous flowsheet row definition    MEDICARE RISK AT HOME:  Medicare Risk at Home Any stairs in or around the home?: Yes (ramp) If so, are there any without handrails?: Yes Home free of loose throw rugs in walkways, pet beds, electrical cords, etc?: Yes Adequate lighting in your home to reduce risk of falls?: Yes Life alert?: No Use of a cane, walker or w/c?: Yes Grab bars in the bathroom?: Yes Shower chair or bench in shower?: Yes Elevated toilet seat or a handicapped toilet?: Yes  TIMED UP AND GO:  Was the test performed?  No  Cognitive Function: Impaired: Patient has current diagnosis of cognitive impairment. Pt cannot answer questions     02/22/2024    2:28 PM 06/22/2023    2:49 PM 03/31/2020    1:27 PM  MMSE - Mini Mental State Exam  Not completed: Unable to complete    Orientation to time  0 2  Orientation to Place  0 1  Registration  0 3  Attention/ Calculation  0 0  Recall  0 0  Language- name 2 objects  0 2  Language- repeat  0 1  Language- follow 3 step command  0 2  Language- read & follow direction  0 0  Write a sentence  0 1   Copy design  0 1  Copy design-comments   3 animals  Total score  0 13        02/20/2023   11:13 AM  6CIT Screen  What Year? 4 points  What month? 3 points  What time? 3 points  Count back from 20 4 points  Months in reverse 4 points  Repeat phrase 10 points  Total Score 28 points    Immunizations Immunization History  Administered Date(s) Administered   Influenza, Seasonal, Injecte, Preservative Fre 05/11/2011, 04/27/2012, 05/20/2013, 07/22/2014   Influenza,inj,Quad PF,6+ Mos 04/18/2019, 06/17/2020   Influenza,trivalent, recombinat, inj, PF 05/08/2017, 05/11/2018   Influenza-Unspecified 06/03/2015, 05/11/2016, 04/11/2019   PFIZER Comirnaty(Gray Top)Covid-19 Tri-Sucrose Vaccine 01/12/2021   PFIZER(Purple Top)SARS-COV-2 Vaccination 08/17/2019, 09/11/2019, 04/25/2020, 10/20/2021   Pfizer Covid-19 Vaccine Bivalent Booster 57yrs & up 10/20/2021   Pfizer(Comirnaty)Fall Seasonal Vaccine 12 years and older 08/18/2022, 05/23/2023   Pneumococcal Conjugate-13 02/20/2014   Pneumococcal Polysaccharide-23 04/27/2012   Tdap 11/23/2011   Zoster Recombinant(Shingrix) 12/23/2019, 05/25/2020    Screening Tests Health Maintenance  Topic Date Due   DTaP/Tdap/Td (2 - Td or Tdap) 11/22/2021   OPHTHALMOLOGY EXAM  09/21/2023   COVID-19 Vaccine (9 - 2024-25 season) 11/20/2023   INFLUENZA VACCINE  02/09/2024   HEMOGLOBIN A1C  03/03/2024   FOOT EXAM  11/12/2024   Pneumococcal Vaccine: 50+ Years  Completed  Zoster Vaccines- Shingrix  Completed   HPV VACCINES  Aged Out   Meningococcal B Vaccine  Aged Out   Pneumococcal Vaccine  Discontinued    Health Maintenance  Health Maintenance Due  Topic Date Due   DTaP/Tdap/Td (2 - Td or Tdap) 11/22/2021   OPHTHALMOLOGY EXAM  09/21/2023   COVID-19 Vaccine (9 - 2024-25 season) 11/20/2023   INFLUENZA VACCINE  02/09/2024   Health Maintenance Items Addressed: None at this time  Additional Screening:  Vision Screening: Recommended annual  ophthalmology exams for early detection of glaucoma and other disorders of the eye. Would you like a referral to an eye doctor? No    Dental Screening: Recommended annual dental exams for proper oral hygiene  Community Resource Referral / Chronic Care Management: CRR required this visit?  No   CCM required this visit?  No Pt receives all his routine care at [facility]   Plan:    I have personally reviewed and noted the following in the patient's chart:   Medical and social history Use of alcohol, tobacco or illicit drugs  Current medications and supplements including opioid prescriptions. Patient is not currently taking opioid prescriptions. Functional ability and status Nutritional status Physical activity Advanced directives List of other physicians Hospitalizations, surgeries, and ER visits in previous 12 months Vitals Screenings to include cognitive, depression, and falls Referrals and appointments  In addition, I have reviewed and discussed with patient certain preventive protocols, quality metrics, and best practice recommendations. A written personalized care plan for preventive services as well as general preventive health recommendations were provided to patient.   Erminio Donaldson Saris, LPN   [id number]   After Visit Summary: (MyChart) Due to this being a telephonic visit, the after visit summary with patients personalized plan was offered to patient via MyChart   Notes: Nothing significant to report at this time.

## 2024-02-24 NOTE — Therapy (Signed)
 OUTPATIENT OCCUPATIONAL THERAPY TREATMENT NOTE  Patient Name: Alexander Donaldson MRN: [id number] DOB:Feb 13, 1934, 89 y.o., male Today's Date: 02/24/2024  PCP: [facility] REFERRING PROVIDER: Rosemarie Eather RAMAN, MD  END OF SESSION:  OT End of Session - 02/24/24 2058     Visit Number 5    Number of Visits 24    Date for OT Re-Evaluation 04/03/24    OT Start Time 1445    OT Stop Time 1530    OT Time Calculation (min) 45 min    Activity Tolerance Patient tolerated treatment well    Behavior During Therapy [facility] for tasks assessed/performed         Past Medical History:  Diagnosis Date   Acute encephalopathy 02/12/2018   Alzheimer's dementia (HCC)    CAD (coronary artery disease)    CAP (community acquired pneumonia) 02/11/2018   Carpal tunnel syndrome, bilateral 02/25/2020   Coronary artery disease    Diabetes mellitus    Diverticulitis    Hearing loss    Hypertension    Hypokalemia 02/12/2018   NSTEMI (non-ST elevated myocardial infarction) [facility])    Past Surgical History:  Procedure Laterality Date   ANGIOPLASTY     CORONARY STENT INTERVENTION N/A 02/02/2017   Procedure: Coronary Stent Intervention;  Surgeon: Levern Hutching, MD;  Location: [facility] INVASIVE CV LAB;  Service: Cardiovascular;  Laterality: N/A;   CORONARY STENTS     ESOPHAGOGASTRODUODENOSCOPY (EGD) WITH PROPOFOL  N/A 09/08/2023   Procedure: ESOPHAGOGASTRODUODENOSCOPY (EGD) WITH PROPOFOL ;  Surgeon: Legrand Victory LITTIE DOUGLAS, MD;  Location: [facility] ENDOSCOPY;  Service: Gastroenterology;  Laterality: N/A;   LEFT HEART CATH AND CORONARY ANGIOGRAPHY N/A 02/02/2017   Procedure: Left Heart Cath and Coronary Angiography;  Surgeon: Levern Hutching, MD;  Location: [facility] INVASIVE CV LAB;  Service: Cardiovascular;  Laterality: N/A;   LEFT HEART CATHETERIZATION WITH CORONARY ANGIOGRAM N/A 03/16/2012   Procedure: LEFT HEART CATHETERIZATION WITH CORONARY ANGIOGRAM;  Surgeon: Hutching LOISE Levern, MD;  Location: [facility] CATH LAB;  Service: Cardiovascular;   Laterality: N/A;   PERCUTANEOUS CORONARY STENT INTERVENTION (PCI-S)  03/16/2012   Procedure: PERCUTANEOUS CORONARY STENT INTERVENTION (PCI-S);  Surgeon: Hutching LOISE Levern, MD;  Location: [facility] CATH LAB;  Service: Cardiovascular;;   TONSILLECTOMY     Patient Active Problem List   Diagnosis Date Noted   Palliative care by specialist 09/11/2023   DNR (do not resuscitate) 09/11/2023   CVA (cerebral vascular accident) (HCC) 09/10/2023   History of CVA 09/04/2023 (cerebrovascular accident) 09/10/2023   History of GI bleed 09/10/2023   Acute CVA (cerebrovascular accident) (HCC) 09/10/2023   Iron  deficiency anemia due to chronic blood loss 09/07/2023   Heme positive stool 09/07/2023   TIA (transient ischemic attack) 09/04/2023   Acute upper GI bleed 09/04/2023   Acute blood loss anemia 09/04/2023   Facial droop 09/04/2023   Weakness of right upper extremity 09/04/2023   Acute kidney injury superimposed on stage 3b chronic kidney disease (HCC) 09/04/2023   Paroxysmal atrial fibrillation (HCC) 09/04/2023   Chronic diastolic CHF (congestive heart failure) (HCC) 09/04/2023   Atrial fibrillation with RVR (HCC) 07/02/2023   NSTEMI, initial episode of care (HCC) 07/02/2023   Exposure to COVID-19 virus 03/23/2023   Dysphagia, unspecified 02/01/2023   Goals of care, counseling/discussion 02/01/2023   Arthralgia 07/05/2022   NSTEMI (non-ST elevated myocardial infarction) (HCC)    Accelerated junctional rhythm 01/30/2022   Dementia without behavioral disturbance (HCC) 01/30/2022   Carpal tunnel syndrome, bilateral 02/25/2020   Numbness 11/28/2019   Raynaud's phenomenon 10/30/2019  CAD (coronary artery disease) 02/12/2018   Left foot drop 02/12/2018   Non-insulin  dependent type 2 diabetes mellitus (HCC) 02/12/2018   Essential hypertension 02/12/2018   Hypokalemia 02/12/2018   ONSET DATE: 09/10/2023  REFERRING DIAG: LCVA MCA  THERAPY DIAG:  Muscle weakness (generalized)  Other lack of  coordination  Rationale for Evaluation and Treatment: Rehabilitation  SUBJECTIVE:  SUBJECTIVE STATEMENT: Spouse reports that she continues to help guide pt to use his R hand as able with ADLs.  Pt accompanied by: self and significant other  PERTINENT HISTORY: Pt. Had recent onset of L CVA MCA due to Afib. Pt. PMHx includes HTN, CKD IIIb, heart failure, dementia, and an Ischemic Stroke.   PRECAUTIONS: None  WEIGHT BEARING RESTRICTIONS: No  PAIN:  Are you having pain? No  FALLS: Has patient fallen in last 6 months? No  LIVING ENVIRONMENT: Lives with: lives with their family and lives with their spouse Lives in: House/apartment, 1 level Stairs:  Yes, but uses ramp. Has following equipment at home: Vannie, rollator, w/c to go out into community, shower chair, grab bars, raised toilet seat  PLOF: Independent  PATIENT GOALS: Strengthen RUE  OBJECTIVE:  Note: Objective measures were completed at Evaluation unless otherwise noted.  HAND DOMINANCE: Right  ADLs: Overall ADLs: Pt. Is unable to initiate ADL tasks.  Transfers/ambulation related to ADLs: uses w/c when wife knows they will be out long.  Eating: Requires Set Up assistance in the initiation of feeding, and requires assistance from caregivers to feed him. Able to feed himself, however, R hand pronates more due to weakness.  Grooming: Wife takes dentures and wife cleans them. Does not initiate task.  UB Dressing: Requires assistance to complete ADL tasks.  LB Dressing: Is able to put shoes on, does not typically initiate task. Uses slip on sneakers, and is able to push foot through.  Toileting: requires 2+ assistance to complete toileting tasks.  Bathing: attempts to wash his face and body Tub Shower transfers: Uses walk in shower and shower chair.  Equipment: Shower seat with back, Grab bars, and bed side commode Work: used to work as Curator Hobbies: singing IADLs: Shopping: Wife typically does the shopping Light  housekeeping: Wife typically does this task. Meal Prep: Public house manager mobility:  Medication management: Wife takes care of Meds Financial management:  Handwriting: TBD  MOBILITY STATUS: Pt. Caregiver states that if they go out for a long outing, Pt. Will use w/c to get around stores.   POSTURE COMMENTS:  No Significant postural limitations Sitting balance: Good  ACTIVITY TOLERANCE: Activity tolerance:   FUNCTIONAL OUTCOME MEASURES:   UPPER EXTREMITY ROM:    Active ROM Right eval Left eval  Shoulder flexion 92(105) 90 (130)  Shoulder abduction 78(110) 80(94)  Shoulder adduction    Shoulder extension    Shoulder internal rotation    Shoulder external rotation    Elbow flexion 132(142) 132(140)  Elbow extension -25(-16) -10 (-5)  Wrist flexion 40 (58) 10(48)  Wrist extension 30(38) 12(38)  Wrist ulnar deviation    Wrist radial deviation    Wrist pronation    Wrist supination    (Blank rows = not tested)  UPPER EXTREMITY MMT:   TBD  MMT Right eval Left eval  Shoulder flexion    Shoulder abduction    Shoulder adduction    Shoulder extension    Shoulder internal rotation    Shoulder external rotation    Middle trapezius    Lower trapezius    Elbow  flexion    Elbow extension    Wrist flexion    Wrist extension    Wrist ulnar deviation    Wrist radial deviation    Wrist pronation    Wrist supination    (Blank rows = not tested)  HAND FUNCTION: TBD  COORDINATION: TBD  SENSATION:  EDEMA:   MUSCLE TONE:   COGNITION: Overall cognitive status: Impaired  VISION: Subjective report: Pt. Caregiver reports changes in vision since recent L MCA CVA. Baseline vision: Wears glasses all the time Visual history:   VISION ASSESSMENT: Not tested  Patient has difficulty with following activities due to following visual impairments:   PRAXIS: Impaired: Initiation, motor planning                                                                                                                             TREATMENT DATE: 02/21/2024   Therapeutic Activities:  Participation in tasks focusing on functional use with the right hand and bilat hand coordination: -Promoted R and bilat hand coordination skills using R hand to pick up washers and place over a vertical and horizontal dowel stabilized with the L hand.  OT downgraded task to have pt pick up washers from rubber non-skid mat rather than magnetic dish d/t inability to grasp washers from magnetic dish after several repeat trials -Pt participated in bimanual [facility] skills working to thread/unthread nuts and bolts (medium and large only)  PATIENT EDUCATION: Education details: bimanual FMC tasks to further promote RUE use Person educated: Patient and Caregiver Karen Education method: Explanation, Demonstration, Tactile cues, and Verbal cues Education comprehension: verbalized understanding, returned demonstration, verbal cues required, tactile cues required, and needs further education  HOME EXERCISE PROGRAM: Engaging R hand into daily tasks, bimanual coordination tasks  GOALS: Goals reviewed with patient? Yes  SHORT TERM GOALS: Target date: 02/21/2024     Pt. Will perform HEPs with  Mod A and Mod cuing. Baseline: Eval: no current HEP Goal status: INITIAL   LONG TERM GOALS: Target date: 04/03/2024    Pt. Will complete shaving using his R hand with Supervision with Min cuing using an Neurosurgeon. Baseline:  Eval: Max A and Max cues to complete shaving. Goal status: INITIAL  2.  Pt. Will use his R hand to wash his face with Supervision with Min cuing to initiate the task. Baseline:  Eval: Max A and Max cues to wash face. Goal status: INITIAL  3.  Pt. Will initiate using his R hand to flip through a magazine independently with Min cues.  Baseline: Eval: Max A and Max cues to initiate using his R hand.  Goal status: INITIAL  4.  Pt. Will increase BUE shoulder ROM by 10  degrees to improve functional reaching tasks to elevated surfaces.  Baseline: Eval: Shoulder Felxion: R: 92(105), L: 90(130), Abduction: R: 78(110), L: 80(94) Goal status: INITIAL  5.  Pt. Will improve R hand [facility] skills to be able to independently manipulate  small objects during ADLs/IADLs. Baseline: Eval: Pt. Has difficulty using the R hand to manipulate small objects during daily care tasks. Goal status: INITIAL    ASSESSMENT: CLINICAL IMPRESSION: Improved tolerance to tx this date as compared to last session.  Pt had become intermittently agitated last session with repeat tactile and vc from both OT and spouse for pt to engage his R hand and limit use of the L.  Improved response to tx with refocus on bilat hand coordination tasks to ensure engagement of the R hand.  OT reviewed additional bimanual FMC tasks to also try in the home.  Spouse continues to report improvements in pt's ability to use the R hand during ADLs, and states that pt is now using a spoon in his R hand, but not yet managing a fork.  Pt. will continue to benefit from OT services to work on initiating desired occupations, increasing [facility] skills and BUE shoulder ROM while performing ADL/IADL tasks.   PERFORMANCE DEFICITS: in functional skills including ADLs, IADLs, coordination, dexterity, ROM, strength, pain, Fine motor control, Gross motor control, vision, and UE functional use, cognitive skills including attention, problem solving, safety awareness, sequencing, thought, and understand, and psychosocial skills including environmental adaptation, habits, interpersonal interactions, and routines and behaviors.   IMPAIRMENTS: are limiting patient from ADLs, IADLs, leisure, and social participation.   CO-MORBIDITIES: may have co-morbidities  that affects occupational performance. Patient will benefit from skilled OT to address above impairments and improve overall function.  MODIFICATION OR ASSISTANCE TO COMPLETE EVALUATION:  Min-Moderate modification of tasks or assist with assess necessary to complete an evaluation.  OT OCCUPATIONAL PROFILE AND HISTORY: Detailed assessment: Review of records and additional review of physical, cognitive, psychosocial history related to current functional performance.  CLINICAL DECISION MAKING: Moderate - several treatment options, min-mod task modification necessary  REHAB POTENTIAL: Good  EVALUATION COMPLEXITY: Moderate    PLAN:  OT FREQUENCY: 2x/week  OT DURATION: 12 weeks  PLANNED INTERVENTIONS: 97168 OT Re-evaluation, 97535 self care/ADL training, 02889 therapeutic exercise, 97530 therapeutic activity, 97112 neuromuscular re-education, 97140 manual therapy, passive range of motion, visual/perceptual remediation/compensation, patient/family education, and DME and/or AE instructions  RECOMMENDED OTHER SERVICES: ST & PT  CONSULTED AND AGREED WITH PLAN OF CARE: Patient and family member/caregiver  PLAN FOR NEXT SESSION: Treatment  Inocente Blazing, MS, OTR/L  02/24/2024, 8:59 PM

## 2024-02-28 ENCOUNTER — Ambulatory Visit

## 2024-02-28 ENCOUNTER — Ambulatory Visit: Admitting: Physical Therapy

## 2024-02-28 DIAGNOSIS — R278 Other lack of coordination: Secondary | ICD-10-CM

## 2024-02-28 DIAGNOSIS — R262 Difficulty in walking, not elsewhere classified: Secondary | ICD-10-CM | POA: Diagnosis not present

## 2024-02-28 DIAGNOSIS — R2689 Other abnormalities of gait and mobility: Secondary | ICD-10-CM | POA: Diagnosis not present

## 2024-02-28 DIAGNOSIS — M6281 Muscle weakness (generalized): Secondary | ICD-10-CM | POA: Diagnosis not present

## 2024-02-28 NOTE — Therapy (Signed)
 OUTPATIENT PHYSICAL THERAPY NEURO TREATMENT   Patient Name: Alexander Donaldson MRN: [id number] DOB:[birth year], 89 y.o., male Today's Date: 02/28/2024   PCP: [facility]  REFERRING PROVIDER: Eather Popp, MD  END OF SESSION:   PT End of Session - 02/28/24 1320     Visit Number 6    Number of Visits 24    Date for PT Re-Evaluation 04/03/24    Progress Note Due on Visit 10    PT Start Time 1320    PT Stop Time 1400    PT Time Calculation (min) 40 min    Equipment Utilized During Treatment Gait belt    Activity Tolerance Patient tolerated treatment well    Behavior During Therapy [facility] for tasks assessed/performed           Past Medical History:  Diagnosis Date   Acute encephalopathy 02/12/2018   Alzheimer's dementia (HCC)    CAD (coronary artery disease)    CAP (community acquired pneumonia) 02/11/2018   Carpal tunnel syndrome, bilateral 02/25/2020   Coronary artery disease    Diabetes mellitus    Diverticulitis    Hearing loss    Hypertension    Hypokalemia 02/12/2018   NSTEMI (non-ST elevated myocardial infarction) [facility])    Past Surgical History:  Procedure Laterality Date   ANGIOPLASTY     CORONARY STENT INTERVENTION N/A 02/02/2017   Procedure: Coronary Stent Intervention;  Surgeon: Levern Hutching, MD;  Location: [facility] INVASIVE CV LAB;  Service: Cardiovascular;  Laterality: N/A;   CORONARY STENTS     ESOPHAGOGASTRODUODENOSCOPY (EGD) WITH PROPOFOL  N/A 09/08/2023   Procedure: ESOPHAGOGASTRODUODENOSCOPY (EGD) WITH PROPOFOL ;  Surgeon: Legrand Victory LITTIE DOUGLAS, MD;  Location: [facility] ENDOSCOPY;  Service: Gastroenterology;  Laterality: N/A;   LEFT HEART CATH AND CORONARY ANGIOGRAPHY N/A 02/02/2017   Procedure: Left Heart Cath and Coronary Angiography;  Surgeon: Levern Hutching, MD;  Location: [facility] INVASIVE CV LAB;  Service: Cardiovascular;  Laterality: N/A;   LEFT HEART CATHETERIZATION WITH CORONARY ANGIOGRAM N/A 03/16/2012   Procedure: LEFT HEART CATHETERIZATION WITH CORONARY  ANGIOGRAM;  Surgeon: Hutching LOISE Levern, MD;  Location: [facility] CATH LAB;  Service: Cardiovascular;  Laterality: N/A;   PERCUTANEOUS CORONARY STENT INTERVENTION (PCI-S)  03/16/2012   Procedure: PERCUTANEOUS CORONARY STENT INTERVENTION (PCI-S);  Surgeon: Hutching LOISE Levern, MD;  Location: [facility] CATH LAB;  Service: Cardiovascular;;   TONSILLECTOMY     Patient Active Problem List   Diagnosis Date Noted   Palliative care by specialist 09/11/2023   DNR (do not resuscitate) 09/11/2023   CVA (cerebral vascular accident) (HCC) 09/10/2023   History of CVA 09/04/2023 (cerebrovascular accident) 09/10/2023   History of GI bleed 09/10/2023   Acute CVA (cerebrovascular accident) (HCC) 09/10/2023   Iron  deficiency anemia due to chronic blood loss 09/07/2023   Heme positive stool 09/07/2023   TIA (transient ischemic attack) 09/04/2023   Acute upper GI bleed 09/04/2023   Acute blood loss anemia 09/04/2023   Facial droop 09/04/2023   Weakness of right upper extremity 09/04/2023   Acute kidney injury superimposed on stage 3b chronic kidney disease (HCC) 09/04/2023   Paroxysmal atrial fibrillation (HCC) 09/04/2023   Chronic diastolic CHF (congestive heart failure) (HCC) 09/04/2023   Atrial fibrillation with RVR (HCC) 07/02/2023   NSTEMI, initial episode of care (HCC) 07/02/2023   Exposure to COVID-19 virus 03/23/2023   Dysphagia, unspecified 02/01/2023   Goals of care, counseling/discussion 02/01/2023   Arthralgia 07/05/2022   NSTEMI (non-ST elevated myocardial infarction) (HCC)    Accelerated junctional rhythm 01/30/2022  Dementia without behavioral disturbance (HCC) 01/30/2022   Carpal tunnel syndrome, bilateral 02/25/2020   Numbness 11/28/2019   Raynaud's phenomenon 10/30/2019   CAD (coronary artery disease) 02/12/2018   Left foot drop 02/12/2018   Non-insulin  dependent type 2 diabetes mellitus (HCC) 02/12/2018   Essential hypertension 02/12/2018   Hypokalemia 02/12/2018    ONSET DATE: 2020   REFERRING  DIAG:  I63.00 (ICD-10-CM) - Cerebrovascular accident (CVA) due to thrombosis of precerebral artery (HCC)  R29.898 (ICD-10-CM) - Right hand weakness    THERAPY DIAG:  Muscle weakness (generalized)  Other abnormalities of gait and mobility  Difficulty in walking, not elsewhere classified  Impaired ambulation  Other lack of coordination  Rationale for Evaluation and Treatment: Rehabilitation  SUBJECTIVE:                                                                                                                                                                                             SUBJECTIVE STATEMENT: 02/28/24: Wife, [name], with patient today. Wife states that he had a nap this AM, so he is doing well.   Pt reports that he is not in any pain today.  [name] stating that  his legs were bothering him  this morning, but now has no pain since taking tylenol  this AM. Patient continues to present with confusion during session today, but not as significant sundowning, which allowed patient increased participation in session without as much encouragement required from his wife.     Of note from prior sessions:  Pt is hard of hearing, hears better in left ear.   From Eval: Pt presents to clinic in transport chair accompanied by his wife. They say that this decline started in 2020 after a visit to the [place] where he was diagnosed with vascular dementia. Wife reports that pt was independent with ADL for four years until 2024, when he started needing assitance for most activities of daily living d/t cognitive decline. His stroke 3 months ago further exacerbated his B UE numbness and LE weakness, although his wife reports that his LE is now not as weak. Pt cognition is significantly impaired, needing constant redirection and simple instructions. Pt previously used SPC at home until 2024, and has now transitioned to a 2WW. Wife reports she has cameras in her home to monitor patient in case of falls.  Wife reported severe musculature atrophy of thenar eminence.  Pt accompanied by: significant other  PERTINENT HISTORY: Cerebral infarction, unspecified (HCC)  Hypertensive heart and chronic kidney disease with heart failure and stage 1 through stage 4 chronic kidney disease, or unspecified chronic kidney disease (HCC)  Gastrointestinal hemorrhage,  unspecified  Chronic kidney disease, unspecified  Paroxysmal atrial fibrillation (HCC)  Hypokalemia  Chronic combined systolic (congestive) and diastolic (congestive) heart failure (HCC)  Type 2 diabetes mellitus with diabetic chronic kidney disease (HCC)  PAIN:  Are you having pain? No  PRECAUTIONS: Fall  RED FLAGS: None   WEIGHT BEARING RESTRICTIONS: No  FALLS: Has patient fallen in last 6 months? No  LIVING ENVIRONMENT: Lives with: lives with their family and lives with their spouse Lives in: House/apartment Stairs: ramp Has following equipment at home: Vannie - 2 wheeled  PLOF: Independent  PATIENT GOALS: Walking around the house stronger   OBJECTIVE:  Note: Objective measures were completed at Evaluation unless otherwise noted.  DIAGNOSTIC FINDINGS:   Per Ct Head code stroke without contrast on 09/10/23:   The study is moderately degraded by patient motion. The right paramedian pontine infarct is noted.   Moderate generalized atrophy and white matter disease is otherwise stable. The ventricles are proportionate to the degree of atrophy. Chronic encephalomalacia is again noted at the left temporal tip.   The brainstem and cerebellum are otherwise within normal limits. Midline structures are within normal limits.  Vascular: Atherosclerotic calcifications are present within the cavernous internal carotid arteries and at the dural margin of both vertebral arteries. No hyperdense vessel is present.   Skull: Calvarium is intact. No focal lytic or blastic lesions are present. No significant extracranial soft  tissue lesion is present.   Sinuses/Orbits: The paranasal sinuses and mastoid air cells are clear. The globes and orbits are within normal limits.  COGNITION: Overall cognitive status: Impaired     POSTURE: rounded shoulders, forward head, increased thoracic kyphosis, anterior pelvic tilt, and flexed trunk   LOWER EXTREMITY ROM:     WFL   LOWER EXTREMITY MMT:    B hip flexors and knee extensors were observed to move within functional range. Formal MMTs deferred d/t patient not understanding test parameters.    GAIT: Findings: Gait Characteristics: decreased hip/knee flexion- Right, decreased hip/knee flexion- Left, shuffling, trunk flexed, poor foot clearance- Right, and poor foot clearance- Left, Distance walked: 39ft, Assistive device utilized:Walker - 2 wheeled, Level of assistance: CGA, and Comments: CGA-MinA  FUNCTIONAL TESTS:  5 times sit to stand: 40 seconds - Heavy  B UE use  Timed up and go (TUG): 70 seconds  10 meter walk test: 40s  PATIENT SURVEYS:   SIS16 - 29                                                                                                                               TREATMENT DATE: 02/28/24    Gait 151ft with HHA on R side; promoting scanning to R side of environment & completing R turns as well as targeting gait endurance additional gait through rehab  gym with HHA 2 x 59ft and 22ft through obstacles including up/down 6inch step, and over half bolster x 2 each. Difficulty with stepping over bolster  on first attempt.   No pain in the low back on this day.   Stair ascent/descent with BUE support on rails; able to ascend with step through pattern and step to pattern with descent.   Seated fine/gross motor task to fold towel in sitting. Able to complete with min-mod cues for sequencing.   Standing tolerance while folding 3 towels. CGA-Min assist for safety and mod-max cues for improved attention to task able to tolerate 6-7 min in standing  prior to requesting seated rest break. Pt intermittently perseverating on precise placement of corners, which allowed increased time in standing.   Nustep level 2 x 8 min with BUE/BLE. Min-mod instruction for continued participation in activity tolerance training.       PATIENT EDUCATION: Education details: POC Person educated: Patient Education method: Explanation Education comprehension: verbalized understanding   HOME EXERCISE PROGRAM: Patient and wife perform exercises at home already - need to review   GOALS: Goals reviewed with patient? Yes  SHORT TERM GOALS: Target date: 04/03/2024    Pt will show proficiency with HEP to maintain progress made in PT and increase overall QoL Baseline: Not yet given Goal status: INITIAL    LONG TERM GOALS: Target date: 04/03/2024    Pt will increase his SIS-16 score by 10 points to increase overall QoL as it relates to his CVA. Baseline: 29 Goal status: INITIAL  2.  Pt will decrease TUG time 30 sec or less without deviating from TUG pathway  to indicate reduced fall risk.  Baseline: 70 seconds  Goal status: INITIAL  3.  Pt will decrease 5xSTS time by 15 seconds to improve strength and power and increase overall QoL.  Baseline: 40 seconds  Goal status: INITIAL  4.  Pt will increase his speed on the by 0.25 m/s to decrease his risk of falls and improve his QoL as a limited community Ambulator.  Baseline: 0.25 m/s  Goal status: INITIAL   ASSESSMENT:  CLINICAL IMPRESSION:  Patient continues to present with confusion; however, not exhibiting true sundowning symptoms today, which allows him to have increased participation in session without heavy support/encouragement from his wife until he becomes fatigued. Continued to focus on improved activity tolerance training with functional task of gait and standing while folding towels. Pt able to fold 3 towels in standing with max encouragement and instruction for sequencing and  attention to task. Mild perseveration on exact alignment of corners, but allowed pt to increase time in standing while engaged in cognitive task. The pt will benefit from further skilled PT to address balance deficits and R hemiparesis in order to increase QOL, decrease fall risk, and ease/safety with ADLs.   OBJECTIVE IMPAIRMENTS: Abnormal gait, decreased activity tolerance, decreased cognition, decreased endurance, decreased mobility, difficulty walking, decreased strength, impaired UE functional use, and improper body mechanics.   ACTIVITY LIMITATIONS: carrying, lifting, squatting, stairs, transfers, and bed mobility  PARTICIPATION LIMITATIONS: meal prep, cleaning, laundry, medication management, personal finances, interpersonal relationship, driving, shopping, community activity, and occupation  PERSONAL FACTORS: Age are also affecting patient's functional outcome.   REHAB POTENTIAL: Good  CLINICAL DECISION MAKING: Evolving/moderate complexity  EVALUATION COMPLEXITY: Moderate  PLAN:  PT FREQUENCY: 2x/week  PT DURATION: 12 weeks  PLANNED INTERVENTIONS: 97110-Therapeutic exercises, 97530- Therapeutic activity, [id number]- Neuromuscular re-education, 97535- Self Care, and [id number]- Manual therapy  PLAN FOR NEXT SESSION:  - practice hands-on training with wife and education on simple, direct cuing to improve patient's ability to follow her commands and ensure his safety  -  initiate HEP if appropriate, wife reports she has exercises they already do, so maybe just review those and advice on whether to change or continue them - R LE strength - Nustep  - standing balance  - R HHA during gait  - stair navigation for functional strength - add AW resistance for strengthening when appropriate     Massie Dollar PT, DPT  Physical Therapist - [facility]  2:15 PM 02/28/24

## 2024-02-28 NOTE — Therapy (Signed)
 OUTPATIENT OCCUPATIONAL THERAPY TREATMENT NOTE  Patient Name: Alexander Donaldson MRN: [id number] DOB:09/02/1933, 89 y.o., male Today's Date: 03/02/2024  [facility]P: [facility] REFERRING PROVIDER: Rosemarie Eather RAMAN, MD  END OF SESSION:  OT End of Session - 03/02/24 1739     Visit Number 6    Number of Visits 24    Date for OT Re-Evaluation 04/03/24    OT Start Time 1400    OT Stop Time 1440    OT Time Calculation (min) 40 min    Activity Tolerance Patient tolerated treatment well    Behavior During Therapy [facility] for tasks assessed/performed          Past Medical History:  Diagnosis Date   Acute encephalopathy 02/12/2018   Alzheimer's dementia (HCC)    CAD (coronary artery disease)    CAP (community acquired pneumonia) 02/11/2018   Carpal tunnel syndrome, bilateral 02/25/2020   Coronary artery disease    Diabetes mellitus    Diverticulitis    Hearing loss    Hypertension    Hypokalemia 02/12/2018   NSTEMI (non-ST elevated myocardial infarction) [facility])    Past Surgical History:  Procedure Laterality Date   ANGIOPLASTY     CORONARY STENT INTERVENTION N/A 02/02/2017   Procedure: Coronary Stent Intervention;  Surgeon: Levern Hutching, MD;  Location: [facility] INVASIVE CV LAB;  Service: Cardiovascular;  Laterality: N/A;   CORONARY STENTS     ESOPHAGOGASTRODUODENOSCOPY (EGD) WITH PROPOFOL  N/A 09/08/2023   Procedure: ESOPHAGOGASTRODUODENOSCOPY (EGD) WITH PROPOFOL ;  Surgeon: Legrand Victory LITTIE DOUGLAS, MD;  Location: [facility] ENDOSCOPY;  Service: Gastroenterology;  Laterality: N/A;   LEFT HEART CATH AND CORONARY ANGIOGRAPHY N/A 02/02/2017   Procedure: Left Heart Cath and Coronary Angiography;  Surgeon: Levern Hutching, MD;  Location: [facility] INVASIVE CV LAB;  Service: Cardiovascular;  Laterality: N/A;   LEFT HEART CATHETERIZATION WITH CORONARY ANGIOGRAM N/A 03/16/2012   Procedure: LEFT HEART CATHETERIZATION WITH CORONARY ANGIOGRAM;  Surgeon: Hutching LOISE Levern, MD;  Location: [facility] CATH LAB;  Service:  Cardiovascular;  Laterality: N/A;   PERCUTANEOUS CORONARY STENT INTERVENTION (PCI-S)  03/16/2012   Procedure: PERCUTANEOUS CORONARY STENT INTERVENTION (PCI-S);  Surgeon: Hutching LOISE Levern, MD;  Location: [facility] CATH LAB;  Service: Cardiovascular;;   TONSILLECTOMY     Patient Active Problem List   Diagnosis Date Noted   Palliative care by specialist 09/11/2023   DNR (do not resuscitate) 09/11/2023   CVA (cerebral vascular accident) (HCC) 09/10/2023   History of CVA 09/04/2023 (cerebrovascular accident) 09/10/2023   History of GI bleed 09/10/2023   Acute CVA (cerebrovascular accident) (HCC) 09/10/2023   Iron  deficiency anemia due to chronic blood loss 09/07/2023   Heme positive stool 09/07/2023   TIA (transient ischemic attack) 09/04/2023   Acute upper GI bleed 09/04/2023   Acute blood loss anemia 09/04/2023   Facial droop 09/04/2023   Weakness of right upper extremity 09/04/2023   Acute kidney injury superimposed on stage 3b chronic kidney disease (HCC) 09/04/2023   Paroxysmal atrial fibrillation (HCC) 09/04/2023   Chronic diastolic CHF (congestive heart failure) (HCC) 09/04/2023   Atrial fibrillation with RVR (HCC) 07/02/2023   NSTEMI, initial episode of care (HCC) 07/02/2023   Exposure to COVID-19 virus 03/23/2023   Dysphagia, unspecified 02/01/2023   Goals of care, counseling/discussion 02/01/2023   Arthralgia 07/05/2022   NSTEMI (non-ST elevated myocardial infarction) (HCC)    Accelerated junctional rhythm 01/30/2022   Dementia without behavioral disturbance (HCC) 01/30/2022   Carpal tunnel syndrome, bilateral 02/25/2020   Numbness 11/28/2019   Raynaud's phenomenon  10/30/2019   CAD (coronary artery disease) 02/12/2018   Left foot drop 02/12/2018   Non-insulin  dependent type 2 diabetes mellitus (HCC) 02/12/2018   Essential hypertension 02/12/2018   Hypokalemia 02/12/2018   ONSET DATE: 09/10/2023  REFERRING DIAG: LCVA MCA  THERAPY DIAG:  Muscle weakness (generalized)  Other  lack of coordination  Rationale for Evaluation and Treatment: Rehabilitation  SUBJECTIVE:  SUBJECTIVE STATEMENT: Spouse reports that pt continues to use spoon in the L hand, but is using his fork consistently in the R hand at home.  Pt accompanied by: self and significant other  PERTINENT HISTORY: Pt. Had recent onset of L CVA MCA due to Afib. Pt. PMHx includes HTN, CKD IIIb, heart failure, dementia, and an Ischemic Stroke.   PRECAUTIONS: None  WEIGHT BEARING RESTRICTIONS: No  PAIN:  Are you having pain? No  FALLS: Has patient fallen in last 6 months? No  LIVING ENVIRONMENT: Lives with: lives with their family and lives with their spouse Lives in: House/apartment, 1 level Stairs:  Yes, but uses ramp. Has following equipment at home: Vannie, rollator, w/c to go out into community, shower chair, grab bars, raised toilet seat  PLOF: Independent  PATIENT GOALS: Strengthen RUE  OBJECTIVE:  Note: Objective measures were completed at Evaluation unless otherwise noted.  HAND DOMINANCE: Right  ADLs: Overall ADLs: Pt. Is unable to initiate ADL tasks.  Transfers/ambulation related to ADLs: uses w/c when wife knows they will be out long.  Eating: Requires Set Up assistance in the initiation of feeding, and requires assistance from caregivers to feed him. Able to feed himself, however, R hand pronates more due to weakness.  Grooming: Wife takes dentures and wife cleans them. Does not initiate task.  UB Dressing: Requires assistance to complete ADL tasks.  LB Dressing: Is able to put shoes on, does not typically initiate task. Uses slip on sneakers, and is able to push foot through.  Toileting: requires 2+ assistance to complete toileting tasks.  Bathing: attempts to wash his face and body Tub Shower transfers: Uses walk in shower and shower chair.  Equipment: Shower seat with back, Grab bars, and bed side commode Work: used to work as Curator Hobbies: singing IADLs: Shopping:  Wife typically does the shopping Light housekeeping: Wife typically does this task. Meal Prep: Public house manager mobility:  Medication management: Wife takes care of Meds Financial management:  Handwriting: TBD  MOBILITY STATUS: Pt. Caregiver states that if they go out for a long outing, Pt. Will use w/c to get around stores.   POSTURE COMMENTS:  No Significant postural limitations Sitting balance: Good  ACTIVITY TOLERANCE: Activity tolerance:   UPPER EXTREMITY ROM:    Active ROM Right eval Left eval  Shoulder flexion 92(105) 90 (130)  Shoulder abduction 78(110) 80(94)  Shoulder adduction    Shoulder extension    Shoulder internal rotation    Shoulder external rotation    Elbow flexion 132(142) 132(140)  Elbow extension -25(-16) -10 (-5)  Wrist flexion 40 (58) 10(48)  Wrist extension 30(38) 12(38)  Wrist ulnar deviation    Wrist radial deviation    Wrist pronation    Wrist supination    (Blank rows = not tested)  UPPER EXTREMITY MMT:   TBD  MMT Right eval Left eval  Shoulder flexion    Shoulder abduction    Shoulder adduction    Shoulder extension    Shoulder internal rotation    Shoulder external rotation    Middle trapezius    Lower trapezius  Elbow flexion    Elbow extension    Wrist flexion    Wrist extension    Wrist ulnar deviation    Wrist radial deviation    Wrist pronation    Wrist supination    (Blank rows = not tested)  HAND FUNCTION: TBD  COORDINATION: TBD  SENSATION:  EDEMA:   MUSCLE TONE:   COGNITION: Overall cognitive status: Impaired  VISION: Subjective report: Pt. Caregiver reports changes in vision since recent L MCA CVA. Baseline vision: Wears glasses all the time Visual history:   VISION ASSESSMENT: Not tested  Patient has difficulty with following activities due to following visual impairments:   PRAXIS: Impaired: Initiation, motor planning                                                                                                                             TREATMENT DATE: 02/28/2024  Therapeutic Activities:  Participation in functional use of the right hand and bilat hand coordination: -small pvc pipe construction and participation in breakdown of construction, requiring bimanual pinching/pulling/twisting to separate parts. -Threading/unthreading nuts/bolts -Practice reps of picking up dice and poker chips from table top using R hand and placing into jar, L hand guided to stabilize jar, but pt frequently switched hands.  Pt required pieces to be placed on towel for easier pick up from table top, or otherwise slid chips off edge of table.   Self Care: -Condition management education with spouse: Self feeding strategies reviewed.  Encouraged spouse to allow pt to continue to use spoon in L hand if eating with the L was efficient without much mess, rather than cause potential agitation with frequent prompts to switch to his R hand, especially if pt has been observed to use his R hand well with a fork (pt using both hands well with utensils).  Spouse verbalized understanding. -Issued fabric apron with various manipulatives (buttons, velcro, straps, zipper), designed for cognitive and FM stimulation for pt's with dementia.  OT advised on appropriate ways to cue pt, and benefits of the apron to provide cognitive and sensory stimulation, and FM activity; issued for home use and encouraged use as part of HEP. -Review of bimanual task practice ideas to engage RUE; ie threading nuts/bolts  PATIENT EDUCATION: Education details: bimanual FMC tasks to further promote RUE use Person educated: Patient and Caregiver [name] Education method: Explanation, Demonstration, Tactile cues, and Verbal cues Education comprehension: verbalized understanding, returned demonstration, verbal cues required, tactile cues required, and needs further education  HOME EXERCISE PROGRAM: Engaging R hand into daily  tasks, bimanual coordination tasks  GOALS: Goals reviewed with patient? Yes  SHORT TERM GOALS: Target date: 02/21/2024     Pt. Will perform HEPs with  Mod A and Mod cuing. Baseline: Eval: no current HEP Goal status: INITIAL   LONG TERM GOALS: Target date: 04/03/2024    Pt. Will complete shaving using his R hand with Supervision with Min cuing using an Neurosurgeon. Baseline:  Eval: Max A and Max cues to complete shaving. Goal status: INITIAL  2.  Pt. Will use his R hand to wash his face with Supervision with Min cuing to initiate the task. Baseline:  Eval: Max A and Max cues to wash face. Goal status: INITIAL  3.  Pt. Will initiate using his R hand to flip through a magazine independently with Min cues.  Baseline: Eval: Max A and Max cues to initiate using his R hand.  Goal status: INITIAL  4.  Pt. Will increase BUE shoulder ROM by 10 degrees to improve functional reaching tasks to elevated surfaces.  Baseline: Eval: Shoulder Felxion: R: 92(105), L: 90(130), Abduction: R: 78(110), L: 80(94) Goal status: INITIAL  5.  Pt. Will improve R hand [facility] skills to be able to independently manipulate small objects during ADLs/IADLs. Baseline: Eval: Pt. Has difficulty using the R hand to manipulate small objects during daily care tasks. Goal status: INITIAL    ASSESSMENT: CLINICAL IMPRESSION: Fairly good tolerance to tx this date with pt showing good engagement of the R hand with specific activities that required bimanual task practice, as pt continues to respond minimally, and often with agitation, to a caregiver or therapist holding L hand in attempts to engage the R instead of the L hand.  Spouse provided frequent prompts to pt to discourage pt from switching between L and R hands with activities noted above.  OT advised spouse on benefits of using R hand as a stabilizer when working with dice/chip/jar activity noted above, as R hand was still engaged, as to avoid pt agitation when  spouse prompted pt to use the R hand for item pick up.  OT reviewed benefits of bimanual task practice to continue to promote engagement of the RUE in a less frustrating manner for pt.  Pt was issued a fidget apron to add to bimanual task practice activity and cognitive stimulation as part of HEP.   Pt. will continue to benefit from OT services to work on initiating desired occupations, increasing [facility] skills and BUE shoulder ROM while performing ADL/IADL tasks.   PERFORMANCE DEFICITS: in functional skills including ADLs, IADLs, coordination, dexterity, ROM, strength, pain, Fine motor control, Gross motor control, vision, and UE functional use, cognitive skills including attention, problem solving, safety awareness, sequencing, thought, and understand, and psychosocial skills including environmental adaptation, habits, interpersonal interactions, and routines and behaviors.   IMPAIRMENTS: are limiting patient from ADLs, IADLs, leisure, and social participation.   CO-MORBIDITIES: may have co-morbidities  that affects occupational performance. Patient will benefit from skilled OT to address above impairments and improve overall function.  MODIFICATION OR ASSISTANCE TO COMPLETE EVALUATION: Min-Moderate modification of tasks or assist with assess necessary to complete an evaluation.  OT OCCUPATIONAL PROFILE AND HISTORY: Detailed assessment: Review of records and additional review of physical, cognitive, psychosocial history related to current functional performance.  CLINICAL DECISION MAKING: Moderate - several treatment options, min-mod task modification necessary  REHAB POTENTIAL: Good  EVALUATION COMPLEXITY: Moderate    PLAN:  OT FREQUENCY: 2x/week  OT DURATION: 12 weeks  PLANNED INTERVENTIONS: 97168 OT Re-evaluation, 97535 self care/ADL training, 02889 therapeutic exercise, 97530 therapeutic activity, 97112 neuromuscular re-education, 97140 manual therapy, passive range of motion,  visual/perceptual remediation/compensation, patient/family education, and DME and/or AE instructions  RECOMMENDED OTHER SERVICES: ST & PT  CONSULTED AND AGREED WITH PLAN OF CARE: Patient and family member/caregiver  PLAN FOR NEXT SESSION: Treatment  Inocente Blazing, MS, OTR/L  03/02/2024, 5:40 PM

## 2024-03-06 ENCOUNTER — Ambulatory Visit: Admitting: Occupational Therapy

## 2024-03-06 ENCOUNTER — Ambulatory Visit: Admitting: Physical Therapy

## 2024-03-06 DIAGNOSIS — M6281 Muscle weakness (generalized): Secondary | ICD-10-CM

## 2024-03-06 DIAGNOSIS — R278 Other lack of coordination: Secondary | ICD-10-CM | POA: Diagnosis not present

## 2024-03-06 DIAGNOSIS — R262 Difficulty in walking, not elsewhere classified: Secondary | ICD-10-CM | POA: Diagnosis not present

## 2024-03-06 DIAGNOSIS — R2689 Other abnormalities of gait and mobility: Secondary | ICD-10-CM | POA: Diagnosis not present

## 2024-03-06 NOTE — Therapy (Signed)
 OUTPATIENT PHYSICAL THERAPY NEURO TREATMENT   Patient Name: Alexander Donaldson MRN: [id number] DOB:[birth year], 89 y.o., male Today's Date: 03/06/2024   PCP: [facility]  REFERRING PROVIDER: Eather Popp, MD  END OF SESSION:   PT End of Session - 03/06/24 1407     Visit Number 7    Number of Visits 24    Date for PT Re-Evaluation 04/03/24    Progress Note Due on Visit 10    PT Start Time 1406    PT Stop Time 1448    PT Time Calculation (min) 42 min    Equipment Utilized During Treatment Gait belt    Activity Tolerance Patient tolerated treatment well    Behavior During Therapy [facility] for tasks assessed/performed            Past Medical History:  Diagnosis Date   Acute encephalopathy 02/12/2018   Alzheimer's dementia (HCC)    CAD (coronary artery disease)    CAP (community acquired pneumonia) 02/11/2018   Carpal tunnel syndrome, bilateral 02/25/2020   Coronary artery disease    Diabetes mellitus    Diverticulitis    Hearing loss    Hypertension    Hypokalemia 02/12/2018   NSTEMI (non-ST elevated myocardial infarction) [facility])    Past Surgical History:  Procedure Laterality Date   ANGIOPLASTY     CORONARY STENT INTERVENTION N/A 02/02/2017   Procedure: Coronary Stent Intervention;  Surgeon: Levern Hutching, MD;  Location: [facility] INVASIVE CV LAB;  Service: Cardiovascular;  Laterality: N/A;   CORONARY STENTS     ESOPHAGOGASTRODUODENOSCOPY (EGD) WITH PROPOFOL  N/A 09/08/2023   Procedure: ESOPHAGOGASTRODUODENOSCOPY (EGD) WITH PROPOFOL ;  Surgeon: Legrand Victory LITTIE DOUGLAS, MD;  Location: [facility] ENDOSCOPY;  Service: Gastroenterology;  Laterality: N/A;   LEFT HEART CATH AND CORONARY ANGIOGRAPHY N/A 02/02/2017   Procedure: Left Heart Cath and Coronary Angiography;  Surgeon: Levern Hutching, MD;  Location: [facility] INVASIVE CV LAB;  Service: Cardiovascular;  Laterality: N/A;   LEFT HEART CATHETERIZATION WITH CORONARY ANGIOGRAM N/A 03/16/2012   Procedure: LEFT HEART CATHETERIZATION WITH CORONARY  ANGIOGRAM;  Surgeon: Hutching LOISE Levern, MD;  Location: [facility] CATH LAB;  Service: Cardiovascular;  Laterality: N/A;   PERCUTANEOUS CORONARY STENT INTERVENTION (PCI-S)  03/16/2012   Procedure: PERCUTANEOUS CORONARY STENT INTERVENTION (PCI-S);  Surgeon: Hutching LOISE Levern, MD;  Location: [facility] CATH LAB;  Service: Cardiovascular;;   TONSILLECTOMY     Patient Active Problem List   Diagnosis Date Noted   Palliative care by specialist 09/11/2023   DNR (do not resuscitate) 09/11/2023   CVA (cerebral vascular accident) (HCC) 09/10/2023   History of CVA 09/04/2023 (cerebrovascular accident) 09/10/2023   History of GI bleed 09/10/2023   Acute CVA (cerebrovascular accident) (HCC) 09/10/2023   Iron  deficiency anemia due to chronic blood loss 09/07/2023   Heme positive stool 09/07/2023   TIA (transient ischemic attack) 09/04/2023   Acute upper GI bleed 09/04/2023   Acute blood loss anemia 09/04/2023   Facial droop 09/04/2023   Weakness of right upper extremity 09/04/2023   Acute kidney injury superimposed on stage 3b chronic kidney disease (HCC) 09/04/2023   Paroxysmal atrial fibrillation (HCC) 09/04/2023   Chronic diastolic CHF (congestive heart failure) (HCC) 09/04/2023   Atrial fibrillation with RVR (HCC) 07/02/2023   NSTEMI, initial episode of care (HCC) 07/02/2023   Exposure to COVID-19 virus 03/23/2023   Dysphagia, unspecified 02/01/2023   Goals of care, counseling/discussion 02/01/2023   Arthralgia 07/05/2022   NSTEMI (non-ST elevated myocardial infarction) (HCC)    Accelerated junctional rhythm 01/30/2022  Dementia without behavioral disturbance (HCC) 01/30/2022   Carpal tunnel syndrome, bilateral 02/25/2020   Numbness 11/28/2019   Raynaud's phenomenon 10/30/2019   CAD (coronary artery disease) 02/12/2018   Left foot drop 02/12/2018   Non-insulin  dependent type 2 diabetes mellitus (HCC) 02/12/2018   Essential hypertension 02/12/2018   Hypokalemia 02/12/2018    ONSET DATE: 2020   REFERRING  DIAG:  I63.00 (ICD-10-CM) - Cerebrovascular accident (CVA) due to thrombosis of precerebral artery (HCC)  R29.898 (ICD-10-CM) - Right hand weakness    THERAPY DIAG:  Muscle weakness (generalized)  Other lack of coordination  Other abnormalities of gait and mobility  Difficulty in walking, not elsewhere classified  Impaired ambulation  Rationale for Evaluation and Treatment: Rehabilitation  SUBJECTIVE:                                                                                                                                                                                             SUBJECTIVE STATEMENT: 03/06/24: Wife, Alexander Donaldson, with patient today. Wife states that patient always helps fold towels at home. Wife continues to report she does chair exercises with patient at home. Wife continues to report patient enjoys upbeat music during his exercise at home. Patient continues to present with confusion during session today, but not as significant sundowning, which allowed patient increased participation in session; although still requires min encouragement from his wife.   Of note from prior sessions:  Pt is hard of hearing, hears better in left ear.   From Eval: Pt presents to clinic in transport chair accompanied by his wife. They say that this decline started in 2020 after a visit to the [place] where he was diagnosed with vascular dementia. Wife reports that pt was independent with ADL for four years until 2024, when he started needing assitance for most activities of daily living d/t cognitive decline. His stroke 3 months ago further exacerbated his B UE numbness and LE weakness, although his wife reports that his LE is now not as weak. Pt cognition is significantly impaired, needing constant redirection and simple instructions. Pt previously used SPC at home until 2024, and has now transitioned to a 2WW. Wife reports she has cameras in her home to monitor patient in case of falls. Wife  reported severe musculature atrophy of thenar eminence.  Pt accompanied by: significant other  PERTINENT HISTORY: Cerebral infarction, unspecified (HCC)  Hypertensive heart and chronic kidney disease with heart failure and stage 1 through stage 4 chronic kidney disease, or unspecified chronic kidney disease (HCC)  Gastrointestinal hemorrhage, unspecified  Chronic kidney disease, unspecified  Paroxysmal atrial fibrillation (HCC)  Hypokalemia  Chronic combined  systolic (congestive) and diastolic (congestive) heart failure (HCC)  Type 2 diabetes mellitus with diabetic chronic kidney disease (HCC)  PAIN:  Are you having pain? No  PRECAUTIONS: Fall  RED FLAGS: None   WEIGHT BEARING RESTRICTIONS: No  FALLS: Has patient fallen in last 6 months? No  LIVING ENVIRONMENT: Lives with: lives with their family and lives with their spouse Lives in: House/apartment Stairs: ramp Has following equipment at home: Vannie - 2 wheeled  PLOF: Independent  PATIENT GOALS: Walking around the house stronger   OBJECTIVE:  Note: Objective measures were completed at Evaluation unless otherwise noted.  DIAGNOSTIC FINDINGS:   Per Ct Head code stroke without contrast on 09/10/23:   The study is moderately degraded by patient motion. The right paramedian pontine infarct is noted.   Moderate generalized atrophy and white matter disease is otherwise stable. The ventricles are proportionate to the degree of atrophy. Chronic encephalomalacia is again noted at the left temporal tip.   The brainstem and cerebellum are otherwise within normal limits. Midline structures are within normal limits.  Vascular: Atherosclerotic calcifications are present within the cavernous internal carotid arteries and at the dural margin of both vertebral arteries. No hyperdense vessel is present.   Skull: Calvarium is intact. No focal lytic or blastic lesions are present. No significant extracranial soft tissue  lesion is present.   Sinuses/Orbits: The paranasal sinuses and mastoid air cells are clear. The globes and orbits are within normal limits.  COGNITION: Overall cognitive status: Impaired     POSTURE: rounded shoulders, forward head, increased thoracic kyphosis, anterior pelvic tilt, and flexed trunk   LOWER EXTREMITY ROM:     WFL   LOWER EXTREMITY MMT:    B hip flexors and knee extensors were observed to move within functional range. Formal MMTs deferred d/t patient not understanding test parameters.    GAIT: Findings: Gait Characteristics: decreased hip/knee flexion- Right, decreased hip/knee flexion- Left, shuffling, trunk flexed, poor foot clearance- Right, and poor foot clearance- Left, Distance walked: 39ft, Assistive device utilized:Walker - 2 wheeled, Level of assistance: CGA, and Comments: CGA-MinA  FUNCTIONAL TESTS:  5 times sit to stand: 40 seconds - Heavy  B UE use  Timed up and go (TUG): 70 seconds  10 meter walk test: 40s  PATIENT SURVEYS:   SIS16 - 29                                                                                                                               TREATMENT DATE: 03/06/24  Unless otherwise stated, CGA/light min A was provided and gait belt donned in order to ensure pt safety throughout session.  Patient continues to benefit from encouragement from his wife to increase participation in session because he prefers to know where she is at all times.  Gait training 141ft with R HHA providing light min A for balance while having 2nd person draw patient's attention to promote scanning R side of environment  due to R inattention. Due to cognitive impairments and likely aphasia, pt unable to identify letters on post-it notes in hallway.   Stair navigation training ascending/descending 4 steps (6 height) using B HRs with CGA/close SBA for safety - starts with step-to pattern leading with R LE progressed to reciprocal pattern on ascent and  then performs reciprocal pattern throughout descent. Attempted to have patient perform additional 4 stairs to promote functional B LE strengthening; however, he was not agreeable at this time  Patient reports minor pain in the low back following gait and stair navigation, but otherwise no other complaints of pain during session  Educated wife on goals of therapy to increase patient's endurance, B LE functional strength (R>L), and dynamic balance; however, with his cognitive impairments, the ultimate goal is to educate her on ways to carryover patient having increased participation in daily functional tasks and exercise routine that he enjoys (such as walking outside) at home. Therapist discussed how patient has previously not been interested in using a RW; however, now appears to not feel confident enough in his gait to ambulate without HHA; therefore, requiring her assistance for functional mobility. Therefore, focused next activity on dynamic balance and moving in a small space without UE support.  Attempted to engage patient in dynamic balance Blaze Pod task at a counter; however, patient had really challenging time understanding task when given too many targets (5) to attend to. Decreased to only 2 blaze pod targets to tap with his feel while performing side stepping next to elevated mat table for UE support as needed - patient has significant difficulty tapping target and not just wanting to put pressure on the target, with inability to follow visual demonstration nor verbal cuing of how to properly tap target. Requires light min A for balance throughout and pt using UE support on mat >90% of the time.   B UE and B LE reciprocal movement pattern on Nustep for cardiovascular training and B LE functional strengthening against the following levels:  Level 1 resistance for 5 minutes Totaling 107 steps Goal steps per minute (SPM): goal was to increase speed and sustain it over time Patient continues to  enjoy this task and enjoys looking at how many steps he is achieving; however, does require 3x therapist initiating movement of the machine to improve his speed because he slows down over time    PATIENT EDUCATION: Education details: POC Person educated: Patient Education method: Explanation Education comprehension: verbalized understanding   HOME EXERCISE PROGRAM: Patient and wife perform exercises at home already - need to review   GOALS: Goals reviewed with patient? Yes  SHORT TERM GOALS: Target date: 04/03/2024    Pt will show proficiency with HEP to maintain progress made in PT and increase overall QoL Baseline: Not yet given Goal status: INITIAL    LONG TERM GOALS: Target date: 04/03/2024    Pt will increase his SIS-16 score by 10 points to increase overall QoL as it relates to his CVA. Baseline: 29 Goal status: INITIAL  2.  Pt will decrease TUG time 30 sec or less without deviating from TUG pathway  to indicate reduced fall risk.  Baseline: 70 seconds  Goal status: INITIAL  3.  Pt will decrease 5xSTS time by 15 seconds to improve strength and power and increase overall QoL.  Baseline: 40 seconds  Goal status: INITIAL  4.  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  Baseline: 0.25 m/s  Goal status: INITIAL   ASSESSMENT:  CLINICAL IMPRESSION:  Patient continues to present with confusion; however, not exhibiting true sundowning symptoms today, which allows him to have increased participation in session with min support/encouragement from his wife. Continued to focus on improved activity tolerance and dynamic balance during functional tasks of gait training and reaching/side stepping activity at a mat table. Patient has significant difficulty following commands to complete a novice task using Blaze Pods today; however, he did appear to enjoy it. Patient continues to  demonstrate R inattention throughout session. Therapist educated patient's wife on goals to improve patients activity tolerance, dynamic balance, and B LE (R>L) functional strength; however, the main goal is to provide her the education and tools to help patient remain as engaged as possible with functional mobility and daily activities at home due to his dementia. The pt will benefit from further skilled PT to address balance deficits and R hemiparesis in order to increase QOL, decrease fall risk, and ease/safety with ADLs.    OBJECTIVE IMPAIRMENTS: Abnormal gait, decreased activity tolerance, decreased cognition, decreased endurance, decreased mobility, difficulty walking, decreased strength, impaired UE functional use, and improper body mechanics.   ACTIVITY LIMITATIONS: carrying, lifting, squatting, stairs, transfers, and bed mobility  PARTICIPATION LIMITATIONS: meal prep, cleaning, laundry, medication management, personal finances, interpersonal relationship, driving, shopping, community activity, and occupation  PERSONAL FACTORS: Age are also affecting patient's functional outcome.   REHAB POTENTIAL: Good  CLINICAL DECISION MAKING: Evolving/moderate complexity  EVALUATION COMPLEXITY: Moderate  PLAN:  PT FREQUENCY: 2x/week  PT DURATION: 12 weeks  PLANNED INTERVENTIONS: 97110-Therapeutic exercises, 97530- Therapeutic activity, 97112- Neuromuscular re-education, 97535- Self Care, and [id number]- Manual therapy  PLAN FOR NEXT SESSION:   - practice hands-on training with wife and education on simple, direct cuing to improve patient's ability to follow her commands and ensure his safety  - Nustep  - outside gait training (patient enjoys walking outside at home) - R HHA during gait  - R LE strength - add AW resistance for strengthening when appropriate - dynamic standing balance/reaching  - focus on R attention - initiate HEP if appropriate, wife reports she has exercises they already  do, so maybe just review those and advice on whether to change or continue them - stair navigation for functional strength     Yousuf Ager, PT, DPT, NCS, CSRS Physical Therapist - [place]  [facility]  2:53 PM 03/06/24

## 2024-03-07 NOTE — Therapy (Signed)
 OUTPATIENT OCCUPATIONAL THERAPY TREATMENT/DISCHARGE NOTE  Patient Name: Alexander Donaldson MRN: [id number] DOB:01-Feb-1934, 89 y.o., male Today's Date: 03/07/2024  PCP: [facility] [place] [facility] REFERRING PROVIDER: Rosemarie Eather RAMAN, MD  END OF SESSION:  OT End of Session - 03/07/24 0818     Visit Number 7    Number of Visits 24    Date for OT Re-Evaluation 04/03/24    OT Start Time 1400    OT Stop Time 1445    OT Time Calculation (min) 45 min    Activity Tolerance Patient tolerated treatment well    Behavior During Therapy [facility] for tasks assessed/performed          Past Medical History:  Diagnosis Date   Acute encephalopathy 02/12/2018   Alzheimer's dementia (HCC)    CAD (coronary artery disease)    CAP (community acquired pneumonia) 02/11/2018   Carpal tunnel syndrome, bilateral 02/25/2020   Coronary artery disease    Diabetes mellitus    Diverticulitis    Hearing loss    Hypertension    Hypokalemia 02/12/2018   NSTEMI (non-ST elevated myocardial infarction) [facility])    Past Surgical History:  Procedure Laterality Date   ANGIOPLASTY     CORONARY STENT INTERVENTION N/A 02/02/2017   Procedure: Coronary Stent Intervention;  Surgeon: Levern Hutching, MD;  Location: [facility] INVASIVE CV LAB;  Service: Cardiovascular;  Laterality: N/A;   CORONARY STENTS     ESOPHAGOGASTRODUODENOSCOPY (EGD) WITH PROPOFOL  N/A 09/08/2023   Procedure: ESOPHAGOGASTRODUODENOSCOPY (EGD) WITH PROPOFOL ;  Surgeon: Legrand Victory LITTIE DOUGLAS, MD;  Location: [facility] ENDOSCOPY;  Service: Gastroenterology;  Laterality: N/A;   LEFT HEART CATH AND CORONARY ANGIOGRAPHY N/A 02/02/2017   Procedure: Left Heart Cath and Coronary Angiography;  Surgeon: Levern Hutching, MD;  Location: [facility] INVASIVE CV LAB;  Service: Cardiovascular;  Laterality: N/A;   LEFT HEART CATHETERIZATION WITH CORONARY ANGIOGRAM N/A 03/16/2012   Procedure: LEFT HEART CATHETERIZATION WITH CORONARY ANGIOGRAM;  Surgeon: Hutching LOISE Levern, MD;  Location: [facility] CATH LAB;  Service:  Cardiovascular;  Laterality: N/A;   PERCUTANEOUS CORONARY STENT INTERVENTION (PCI-S)  03/16/2012   Procedure: PERCUTANEOUS CORONARY STENT INTERVENTION (PCI-S);  Surgeon: Hutching LOISE Levern, MD;  Location: [facility] CATH LAB;  Service: Cardiovascular;;   TONSILLECTOMY     Patient Active Problem List   Diagnosis Date Noted   Palliative care by specialist 09/11/2023   DNR (do not resuscitate) 09/11/2023   CVA (cerebral vascular accident) (HCC) 09/10/2023   History of CVA 09/04/2023 (cerebrovascular accident) 09/10/2023   History of GI bleed 09/10/2023   Acute CVA (cerebrovascular accident) (HCC) 09/10/2023   Iron  deficiency anemia due to chronic blood loss 09/07/2023   Heme positive stool 09/07/2023   TIA (transient ischemic attack) 09/04/2023   Acute upper GI bleed 09/04/2023   Acute blood loss anemia 09/04/2023   Facial droop 09/04/2023   Weakness of right upper extremity 09/04/2023   Acute kidney injury superimposed on stage 3b chronic kidney disease (HCC) 09/04/2023   Paroxysmal atrial fibrillation (HCC) 09/04/2023   Chronic diastolic CHF (congestive heart failure) (HCC) 09/04/2023   Atrial fibrillation with RVR (HCC) 07/02/2023   NSTEMI, initial episode of care (HCC) 07/02/2023   Exposure to COVID-19 virus 03/23/2023   Dysphagia, unspecified 02/01/2023   Goals of care, counseling/discussion 02/01/2023   Arthralgia 07/05/2022   NSTEMI (non-ST elevated myocardial infarction) (HCC)    Accelerated junctional rhythm 01/30/2022   Dementia without behavioral disturbance (HCC) 01/30/2022   Carpal tunnel syndrome, bilateral 02/25/2020   Numbness 11/28/2019   Raynaud's phenomenon  10/30/2019   CAD (coronary artery disease) 02/12/2018   Left foot drop 02/12/2018   Non-insulin  dependent type 2 diabetes mellitus (HCC) 02/12/2018   Essential hypertension 02/12/2018   Hypokalemia 02/12/2018   ONSET DATE: 09/10/2023  REFERRING DIAG: LCVA MCA  THERAPY DIAG:  Muscle weakness  (generalized)  Rationale for Evaluation and Treatment: Rehabilitation  SUBJECTIVE:  SUBJECTIVE STATEMENT: Spouse reports that pt continues to use spoon in the L hand, but is using his fork consistently in the R hand at home.  Pt accompanied by: self and significant other  PERTINENT HISTORY: Pt. Had recent onset of L CVA MCA due to Afib. Pt. PMHx includes HTN, CKD IIIb, heart failure, dementia, and an Ischemic Stroke.   PRECAUTIONS: None  WEIGHT BEARING RESTRICTIONS: No  PAIN:  Are you having pain? No  FALLS: Has patient fallen in last 6 months? No  LIVING ENVIRONMENT: Lives with: lives with their family and lives with their spouse Lives in: House/apartment, 1 level Stairs:  Yes, but uses ramp. Has following equipment at home: Vannie, rollator, w/c to go out into community, shower chair, grab bars, raised toilet seat  PLOF: Independent  PATIENT GOALS: Strengthen RUE  OBJECTIVE:  Note: Objective measures were completed at Evaluation unless otherwise noted.  HAND DOMINANCE: Right  ADLs: Overall ADLs: Pt. Is unable to initiate ADL tasks.  Transfers/ambulation related to ADLs: uses w/c when wife knows they will be out long.  Eating: Requires Set Up assistance in the initiation of feeding, and requires assistance from caregivers to feed him. Able to feed himself, however, R hand pronates more due to weakness.  Grooming: Wife takes dentures and wife cleans them. Does not initiate task.  UB Dressing: Requires assistance to complete ADL tasks.  LB Dressing: Is able to put shoes on, does not typically initiate task. Uses slip on sneakers, and is able to push foot through.  Toileting: requires 2+ assistance to complete toileting tasks.  Bathing: attempts to wash his face and body Tub Shower transfers: Uses walk in shower and shower chair.  Equipment: Shower seat with back, Grab bars, and bed side commode Work: used to work as Curator Hobbies: singing IADLs: Shopping: Wife  typically does the shopping Light housekeeping: Wife typically does this task. Meal Prep: Public house manager mobility:  Medication management: Wife takes care of Meds Financial management:  Handwriting: TBD  MOBILITY STATUS: Pt. Caregiver states that if they go out for a long outing, Pt. Will use w/c to get around stores.   POSTURE COMMENTS:  No Significant postural limitations Sitting balance: Good  ACTIVITY TOLERANCE: Activity tolerance:   UPPER EXTREMITY ROM:    Active ROM Right eval Left eval  Shoulder flexion 92(105) 90 (130)  Shoulder abduction 78(110) 80(94)  Shoulder adduction    Shoulder extension    Shoulder internal rotation    Shoulder external rotation    Elbow flexion 132(142) 132(140)  Elbow extension -25(-16) -10 (-5)  Wrist flexion 40 (58) 10(48)  Wrist extension 30(38) 12(38)  Wrist ulnar deviation    Wrist radial deviation    Wrist pronation    Wrist supination    (Blank rows = not tested)  UPPER EXTREMITY MMT:   TBD  MMT Right eval Left eval  Shoulder flexion    Shoulder abduction    Shoulder adduction    Shoulder extension    Shoulder internal rotation    Shoulder external rotation    Middle trapezius    Lower trapezius    Elbow flexion  Elbow extension    Wrist flexion    Wrist extension    Wrist ulnar deviation    Wrist radial deviation    Wrist pronation    Wrist supination    (Blank rows = not tested)  HAND FUNCTION: TBD  COORDINATION: TBD  SENSATION:  EDEMA:   MUSCLE TONE:   COGNITION: Overall cognitive status: Impaired  VISION: Subjective report: Pt. Caregiver reports changes in vision since recent L MCA CVA. Baseline vision: Wears glasses all the time Visual history:   VISION ASSESSMENT: Not tested  Patient has difficulty with following activities due to following visual impairments:   PRAXIS: Impaired: Initiation, motor planning                                                                                                                             TREATMENT DATE: 03/06/2024   Therapeutic Activities:   -Facilitated functional reaching with the RUE through multiple elevated planes with hand-over-hand manual assist provided.  Self Care:  -Condition management education provided with spouse about cognitive compensatory for continued engagement in ADL, and IADL tasks at home.   There.Ex:  -Pt. Caregiver education was provided about ROM to the RUE through all joint ranges of motion.   PATIENT EDUCATION: Education details:  D/c follow-up needs Person educated: Patient and Caregiver Karen Education method: Explanation, Demonstration, Tactile cues, and Verbal cues Education comprehension: verbalized understanding, returned demonstration, verbal cues required, tactile cues required, and needs further education  HOME EXERCISE PROGRAM: Engaging R hand into daily tasks, bimanual coordination tasks  GOALS: Goals reviewed with patient? Yes  SHORT TERM GOALS: Target date: 02/21/2024     Pt. Will perform HEPs with  Mod A and Mod cuing. Baseline: Eval: no current HEP Goal status: INITIAL   LONG TERM GOALS: Target date: 04/03/2024    Pt. Will complete shaving using his R hand with Supervision with Min cuing using an Neurosurgeon. Baseline:  Eval: Max A and Max cues to complete shaving. Goal status: INITIAL  2.  Pt. Will use his R hand to wash his face with Supervision with Min cuing to initiate the task. Baseline:  Eval: Max A and Max cues to wash face. Goal status: INITIAL  3.  Pt. Will initiate using his R hand to flip through a magazine independently with Min cues.  Baseline: Eval: Max A and Max cues to initiate using his R hand.  Goal status: INITIAL  4.  Pt. Will increase BUE shoulder ROM by 10 degrees to improve functional reaching tasks to elevated surfaces.  Baseline: Eval: Shoulder Felxion: R: 92(105), L: 90(130), Abduction: R: 78(110), L: 80(94) Goal  status: INITIAL  5.  Pt. Will improve R hand [facility] skills to be able to independently manipulate small objects during ADLs/IADLs. Baseline: Eval: Pt. Has difficulty using the R hand to manipulate small objects during daily care tasks. Goal status: INITIAL    ASSESSMENT: CLINICAL IMPRESSION:   Pt. continues to require  consistent cues, and assist for ROM, functional reaching, and to increase engagement of the RUE in ADLs, and IADL tasks. Pt./caregiver education was provided about continuing ongoing opportunities for increasing engagement of the RUE during ADLs, and IADL tasks. Pt. is now appropriate for discharge from OT services. Pt. Caregiver is in agreement, and has support , and home aides to assist as needed.   PERFORMANCE DEFICITS: in functional skills including ADLs, IADLs, coordination, dexterity, ROM, strength, pain, Fine motor control, Gross motor control, vision, and UE functional use, cognitive skills including attention, problem solving, safety awareness, sequencing, thought, and understand, and psychosocial skills including environmental adaptation, habits, interpersonal interactions, and routines and behaviors.   IMPAIRMENTS: are limiting patient from ADLs, IADLs, leisure, and social participation.   CO-MORBIDITIES: may have co-morbidities  that affects occupational performance. Patient will benefit from skilled OT to address above impairments and improve overall function.  MODIFICATION OR ASSISTANCE TO COMPLETE EVALUATION: Min-Moderate modification of tasks or assist with assess necessary to complete an evaluation.  OT OCCUPATIONAL PROFILE AND HISTORY: Detailed assessment: Review of records and additional review of physical, cognitive, psychosocial history related to current functional performance.  CLINICAL DECISION MAKING: Moderate - several treatment options, min-mod task modification necessary  REHAB POTENTIAL: Good  EVALUATION COMPLEXITY: Moderate    PLAN:  OT  FREQUENCY: 2x/week  OT DURATION: 12 weeks  PLANNED INTERVENTIONS: 97168 OT Re-evaluation, 97535 self care/ADL training, 02889 therapeutic exercise, 97530 therapeutic activity, 97112 neuromuscular re-education, 97140 manual therapy, passive range of motion, visual/perceptual remediation/compensation, patient/family education, and DME and/or AE instructions  RECOMMENDED OTHER SERVICES: ST & PT  CONSULTED AND AGREED WITH PLAN OF CARE: Patient and family member/caregiver  PLAN FOR NEXT SESSION: Treatment  Richardson Otter, MS, OTR/L   03/07/2024, 8:20 AM

## 2024-03-13 ENCOUNTER — Ambulatory Visit: Attending: Neurology | Admitting: Physical Therapy

## 2024-03-13 ENCOUNTER — Ambulatory Visit

## 2024-03-13 DIAGNOSIS — R262 Difficulty in walking, not elsewhere classified: Secondary | ICD-10-CM | POA: Insufficient documentation

## 2024-03-13 DIAGNOSIS — R2689 Other abnormalities of gait and mobility: Secondary | ICD-10-CM | POA: Diagnosis not present

## 2024-03-13 DIAGNOSIS — R278 Other lack of coordination: Secondary | ICD-10-CM | POA: Diagnosis not present

## 2024-03-13 DIAGNOSIS — M6281 Muscle weakness (generalized): Secondary | ICD-10-CM | POA: Insufficient documentation

## 2024-03-13 NOTE — Therapy (Signed)
 OUTPATIENT PHYSICAL THERAPY NEURO TREATMENT and DISCHARGE    Patient Name: Alexander Donaldson MRN: [id number] DOB:1933/08/22, 89 y.o., male Today's Date: 03/13/2024   PCP: [facility]  REFERRING PROVIDER: Eather Popp, MD  END OF SESSION:   PT End of Session - 03/13/24 1359     Visit Number 8    Number of Visits 24    Date for PT Re-Evaluation 04/03/24    Progress Note Due on Visit 10    PT Start Time 1401    PT Stop Time 1448    PT Time Calculation (min) 47 min    Equipment Utilized During Treatment Gait belt    Activity Tolerance Patient tolerated treatment well    Behavior During Therapy [facility] for tasks assessed/performed             Past Medical History:  Diagnosis Date   Acute encephalopathy 02/12/2018   Alzheimer's dementia (HCC)    CAD (coronary artery disease)    CAP (community acquired pneumonia) 02/11/2018   Carpal tunnel syndrome, bilateral 02/25/2020   Coronary artery disease    Diabetes mellitus    Diverticulitis    Hearing loss    Hypertension    Hypokalemia 02/12/2018   NSTEMI (non-ST elevated myocardial infarction) [facility])    Past Surgical History:  Procedure Laterality Date   ANGIOPLASTY     CORONARY STENT INTERVENTION N/A 02/02/2017   Procedure: Coronary Stent Intervention;  Surgeon: Alexander Hutching, MD;  Location: [facility] INVASIVE CV LAB;  Service: Cardiovascular;  Laterality: N/A;   CORONARY STENTS     ESOPHAGOGASTRODUODENOSCOPY (EGD) WITH PROPOFOL  N/A 09/08/2023   Procedure: ESOPHAGOGASTRODUODENOSCOPY (EGD) WITH PROPOFOL ;  Surgeon: Alexander Victory LITTIE DOUGLAS, MD;  Location: [facility];  Service: Gastroenterology;  Laterality: N/A;   LEFT HEART CATH AND CORONARY ANGIOGRAPHY N/A 02/02/2017   Procedure: Left Heart Cath and Coronary Angiography;  Surgeon: Alexander Hutching, MD;  Location: [facility] INVASIVE CV LAB;  Service: Cardiovascular;  Laterality: N/A;   LEFT HEART CATHETERIZATION WITH CORONARY ANGIOGRAM N/A 03/16/2012   Procedure: LEFT HEART CATHETERIZATION  WITH CORONARY ANGIOGRAM;  Surgeon: Donaldson LOISE Levern, MD;  Location: [facility] CATH LAB;  Service: Cardiovascular;  Laterality: N/A;   PERCUTANEOUS CORONARY STENT INTERVENTION (PCI-S)  03/16/2012   Procedure: PERCUTANEOUS CORONARY STENT INTERVENTION (PCI-S);  Surgeon: Donaldson LOISE Levern, MD;  Location: [facility] CATH LAB;  Service: Cardiovascular;;   TONSILLECTOMY     Patient Active Problem List   Diagnosis Date Noted   Palliative care by specialist 09/11/2023   DNR (do not resuscitate) 09/11/2023   CVA (cerebral vascular accident) (HCC) 09/10/2023   History of CVA 09/04/2023 (cerebrovascular accident) 09/10/2023   History of GI bleed 09/10/2023   Acute CVA (cerebrovascular accident) (HCC) 09/10/2023   Iron  deficiency anemia due to chronic blood loss 09/07/2023   Heme positive stool 09/07/2023   TIA (transient ischemic attack) 09/04/2023   Acute upper GI bleed 09/04/2023   Acute blood loss anemia 09/04/2023   Facial droop 09/04/2023   Weakness of right upper extremity 09/04/2023   Acute kidney injury superimposed on stage 3b chronic kidney disease (HCC) 09/04/2023   Paroxysmal atrial fibrillation (HCC) 09/04/2023   Chronic diastolic CHF (congestive heart failure) (HCC) 09/04/2023   Atrial fibrillation with RVR (HCC) 07/02/2023   NSTEMI, initial episode of care (HCC) 07/02/2023   Exposure to COVID-19 virus 03/23/2023   Dysphagia, unspecified 02/01/2023   Goals of care, counseling/discussion 02/01/2023   Arthralgia 07/05/2022   NSTEMI (non-ST elevated myocardial infarction) (HCC)  Accelerated junctional rhythm 01/30/2022   Dementia without behavioral disturbance (HCC) 01/30/2022   Carpal tunnel syndrome, bilateral 02/25/2020   Numbness 11/28/2019   Raynaud's phenomenon 10/30/2019   CAD (coronary artery disease) 02/12/2018   Left foot drop 02/12/2018   Non-insulin  dependent type 2 diabetes mellitus (HCC) 02/12/2018   Essential hypertension 02/12/2018   Hypokalemia 02/12/2018    ONSET DATE: 2020    REFERRING DIAG:  I63.00 (ICD-10-CM) - Cerebrovascular accident (CVA) due to thrombosis of precerebral artery (HCC)  R29.898 (ICD-10-CM) - Right hand weakness    THERAPY DIAG:  Muscle weakness (generalized)  Other lack of coordination  Other abnormalities of gait and mobility  Difficulty in walking, not elsewhere classified  Impaired ambulation  Rationale for Evaluation and Treatment: Rehabilitation  SUBJECTIVE:                                                                                                                                                                                             SUBJECTIVE STATEMENT: 03/13/24: Wife, [name], with patient today. Wife reports she can tell patient is using his R hand more since starting therapy. Wife states she is always on his R side now to promote attention to that side. Wife reports patient has a young caregiver that supports him 4hrs/day and has a lot of energy to engage him in exercises. Patient continues to present with confusion during session today due to dementia and benefits from min encouragement from his wife to increase participation.   Of note from prior sessions:  Pt is hard of hearing, hears better in left ear.   From Eval: Pt presents to clinic in transport chair accompanied by his wife. They say that this decline started in 2020 after a visit to the [place] where he was diagnosed with vascular dementia. Wife reports that pt was independent with ADL for four years until 2024, when he started needing assitance for most activities of daily living d/t cognitive decline. His stroke 3 months ago further exacerbated his B UE numbness and LE weakness, although his wife reports that his LE is now not as weak. Pt cognition is significantly impaired, needing constant redirection and simple instructions. Pt previously used SPC at home until 2024, and has now transitioned to a 2WW. Wife reports she has cameras in her home to monitor patient  in case of falls. Wife reported severe musculature atrophy of thenar eminence.  Pt accompanied by: significant other  PERTINENT HISTORY: Cerebral infarction, unspecified (HCC)  Hypertensive heart and chronic kidney disease with heart failure and stage 1 through stage 4 chronic kidney disease, or unspecified chronic kidney disease (HCC)  Gastrointestinal hemorrhage, unspecified  Chronic kidney disease, unspecified  Paroxysmal atrial fibrillation (HCC)  Hypokalemia  Chronic combined systolic (congestive) and diastolic (congestive) heart failure (HCC)  Type 2 diabetes mellitus with diabetic chronic kidney disease (HCC)  PAIN:  Are you having pain? No  PRECAUTIONS: Fall  RED FLAGS: None   WEIGHT BEARING RESTRICTIONS: No  FALLS: Has patient fallen in last 6 months? No  LIVING ENVIRONMENT: Lives with: lives with their family and lives with their spouse Lives in: House/apartment Stairs: ramp Has following equipment at home: Vannie - 2 wheeled  PLOF: Independent  PATIENT GOALS: Walking around the house stronger   OBJECTIVE:  Note: Objective measures were completed at Evaluation unless otherwise noted.  DIAGNOSTIC FINDINGS:   Per Ct Head code stroke without contrast on 09/10/23:   The study is moderately degraded by patient motion. The right paramedian pontine infarct is noted.   Moderate generalized atrophy and white matter disease is otherwise stable. The ventricles are proportionate to the degree of atrophy. Chronic encephalomalacia is again noted at the left temporal tip.   The brainstem and cerebellum are otherwise within normal limits. Midline structures are within normal limits.  Vascular: Atherosclerotic calcifications are present within the cavernous internal carotid arteries and at the dural margin of both vertebral arteries. No hyperdense vessel is present.   Skull: Calvarium is intact. No focal lytic or blastic lesions are present. No significant  extracranial soft tissue lesion is present.   Sinuses/Orbits: The paranasal sinuses and mastoid air cells are clear. The globes and orbits are within normal limits.  COGNITION: Overall cognitive status: Impaired     POSTURE: rounded shoulders, forward head, increased thoracic kyphosis, anterior pelvic tilt, and flexed trunk   LOWER EXTREMITY ROM:     WFL   LOWER EXTREMITY MMT:    B hip flexors and knee extensors were observed to move within functional range. Formal MMTs deferred d/t patient not understanding test parameters.    GAIT: Findings: Gait Characteristics: decreased hip/knee flexion- Right, decreased hip/knee flexion- Left, shuffling, trunk flexed, poor foot clearance- Right, and poor foot clearance- Left, Distance walked: 84ft, Assistive device utilized:Walker - 2 wheeled, Level of assistance: CGA, and Comments: CGA-MinA  FUNCTIONAL TESTS:  5 times sit to stand: 40 seconds - Heavy  B UE use  Timed up and go (TUG): 70 seconds  10 meter walk test: 40s  PATIENT SURVEYS:   SIS16 - 29                                                                                                                               TREATMENT DATE: 03/13/24  Therapy session focused on educating wife, [name], on ways to encourage patient to continue to stay active and maintain his mobility level, including: [employer] to use Nustep Gait up/down their ramp at home Sit<>stand exercise  Focus on functional strengthening exercises in weight bearing position as opposed to seated/open chain exercises Educated on encouraging patient  to participate in exercises standing at kitchen counter with her support for safety, including: Marches Side stepping  Heel raises Hip abduction Forward/backwards gait  Provided pt's wife HEP printout as described below. Wife reports she feels confident in her ability to carryover what she has learned during physical therapy to assist patient in maintaining his mobility at  home.  Unless otherwise stated, CGA/light min A was provided and gait belt donned in order to ensure pt safety throughout session.   Stroke Impact Scale 16 -  completed by patient's wife (Copyrighted instrument, [employer])  In the past 2 weeks, how difficult was it to...  Rating Scale 5 = Not difficult at all 4 = A little difficult 3 = Somewhat difficult 2 = Very difficult 1 = Could not do at all  a. Dress the top part of your body? 3  b. Bathe yourself? 1  c. Get to the toilet on time? 2  d. Control your bladder (not have an accident)? 2  e. Control your bowels (not have an accident)? 2  f. Stand without losing balance? 3  g. Go shopping? 2  h. Do heavy household chores (e.g. vacuum, laundry or  yard work)? 1  i. Stay sitting without losing your balance? 5  j. Walk without losing your balance? 3  k. Move from a bed to a chair? 5  l. Walk fast? 3  m. Climb one flight of stairs? 3  n. Walk one block? 3  o. Get in and out of a car? 5  p. Carry heavy objects (e.g. bag of groceries) with your  affected hand? 1  Sum:  44/80  MDC (Minimal Detectable Change) is >/=8    Participated in Timed Up and Go (TUG): 1st trial: 20.42 seconds 2nd trial: 21.39 seconds  Average: 20.91 seconds using R HHA, min A Patient demonstrates high fall risk as indicated by requiring >13.5seconds to complete the TUG.   Discussed AD options with wife because patient is resistant to using RW; however, due to patient's cognition, he is unsafe to use rollator and wife in agreement. Wife states patient is agreeable to using RW first thing in the morning, but otherwise he uses her HHA for gait. Therapist educated wife that at this time patient will require HHA because he is unsafe to ambulate without assistance, especially given not using an AD.  Five times Sit to Stand Test (FTSS) "Stand up and sit down as quickly as possible 5 times, keeping your arms folded across your chest."     TIME: 24.43 seconds BUE support at chair armrests - benefits from therapist and wife also doing the test with him, in order to motivate him to continue with the transfers  Times > 13.6 seconds is associated with increased disability and morbidity (Guralnik, 2000) Times > 15 seconds is predictive of recurrent falls in healthy individuals aged 58 and older (Buatois, et al., 2008) Normal performance values in community dwelling individuals aged 63 and older (Bohannon, 2006): 60-69 years: 11.4 seconds 70-79 years: 12.6 seconds 80-89 years: 14.8 seconds  MCID: >= 2.3 seconds for Vestibular Disorders (Meretta, 2006)   10 Meter Walk Test: Patient instructed to walk 10 meters (32.8 ft) as quickly and as safely as possible at their normal speed x2 and at a fast speed x2. Time measured from 2 meter mark to 8 meter mark to accommodate ramp-up and ramp-down.  Normal speed 1: 0.53 m/s (18.85 seconds) Normal speed 2: 0.43 m/s (23.45 seconds) Average  Normal speed: 0.48 m/s using R HHA providing light min A for balance Cut off scores: <0.4 m/s = household Ambulator, 0.4-0.8 m/s = limited community Ambulator, >0.8 m/s = community Ambulator, >1.2 m/s = crossing a street, <1.0 = increased fall risk MCID 0.05 m/s (small), 0.13 m/s (moderate), 0.06 m/s (significant)  (ANPTA Core Set of Outcome Measures for Adults with Neurologic Conditions, 2018)   B UE and B LE reciprocal movement pattern on Nustep for cardiovascular training and B LE functional strengthening against the following levels:  Level 1 resistance for 6 minutes Totaling 160 steps Goal steps per minute (SPM): goal was to increase speed and sustain it over time to achieve a minimum of 150steps Patient continues to enjoy this task and enjoys looking at how many steps he is achieving; however, does require 3x therapist initiating movement of the machine to improve his speed because he slows down over time  Patient continues to benefit from  encouragement from his wife to increase participation in session because he prefers to know where she is at all times.  Patient's wife in agreement with plan to discharge from therapy at this time. Wife states she feels confident carrying over what was learned during therapy sessions to promote patient maintaining his improvements in his functional mobility.  PATIENT EDUCATION: Education details: POC Person educated: Patient Education method: Explanation Education comprehension: verbalized understanding   HOME EXERCISE PROGRAM: Access Code: [email or web address] URL: [email or web address] Date: 03/13/2024 Prepared by: Connell Kiss  Exercises - Sit to Stand with Counter Support  - 1 x daily - 3-5 x weekly - 2-3 sets - 10 reps - Standing Hip Abduction with Counter Support  - 1 x daily - 3-5 x weekly - 2-3 sets - 10 reps - Heel Raises with Counter Support  - 1 x daily - 3-5 x weekly - 2-3 sets - 10 reps - Mini Squat with Counter Support  - 1 x daily - 3-5 x weekly - 2-3 sets - 10 reps - Side Stepping with Counter Support  - 1 x daily - 3-5 x weekly - 2-3 sets - 10 reps - Backward Walking with Counter Support  - 1 x daily - 3-5 x weekly - 2-3 sets - 10 reps   GOALS: Goals reviewed with patient? Yes  SHORT TERM GOALS: Target date: 04/03/2024    Pt will show proficiency with HEP to maintain progress made in PT and increase overall QoL Baseline: 03/13/2024: provided printout on 03/13/2024 with pt's wife reporting patient does well with the exercises provided Goal status: MET     LONG TERM GOALS: Target date: 04/03/2024    Pt will increase his SIS-16 score by 10 points to increase overall QoL as it relates to his CVA. Baseline: 29 03/13/2024: 44/80 Goal status: MET  2.  Pt will decrease TUG time 30 sec or less without deviating from TUG pathway to indicate reduced fall risk.  Baseline: 70 seconds  03/13/2024:  20.91 seconds using R HHA, min A Goal status: MET  3.  Pt will decrease  5xSTS time by 15 seconds to improve strength and power and increase overall QoL.  Baseline: 40 seconds  03/13/2024: 24.43 seconds BUE support at chair armrests Goal status: MET  4.  Pt will increase his speed on the by 0.25 m/s to decrease his risk of falls and improve his QoL as a limited community Ambulator.  Baseline: 0.25 m/s  03/13/2024:  0.48 m/s using R HHA providing light min A for balance Goal  status: PARTIALLY MET   ASSESSMENT:  CLINICAL IMPRESSION:  Following discussion with patient's wife regarding goals of therapy to provide her education and resources to help carryover patient's improvements in functional mobility, activity tolerance, strength, and balance at home; focus of therapy session transitioned to discharge. Provided HEP printout with education to wife on goal to engage patient in as many standing, weightbearing exercises as possible as opposed to focusing primarily on seated exercises. Patient repeated functional outcome measures and shows significant improvement on all items indicating increased functional strength and gait speed; however, pt does still rely in R HHA for balance support. Patient's wife also reports significant improvement in patient's functional mobility and ADLs as noted on SIS-16. Patient has reached maximum benefit from therapy at this time and wife in agreement with plan to discharge at this time.    OBJECTIVE IMPAIRMENTS: Abnormal gait, decreased activity tolerance, decreased cognition, decreased endurance, decreased mobility, difficulty walking, decreased strength, impaired UE functional use, and improper body mechanics.   ACTIVITY LIMITATIONS: carrying, lifting, squatting, stairs, transfers, and bed mobility  PARTICIPATION LIMITATIONS: meal prep, cleaning, laundry, medication management, personal finances, interpersonal relationship, driving, shopping, community activity, and occupation  PERSONAL FACTORS: Age are also affecting patient's  functional outcome.   REHAB POTENTIAL: Good  CLINICAL DECISION MAKING: Evolving/moderate complexity  EVALUATION COMPLEXITY: Moderate  PLAN:  PT FREQUENCY: 2x/week  PT DURATION: 12 weeks  PLANNED INTERVENTIONS: 97110-Therapeutic exercises, 97530- Therapeutic activity, [id number]- Neuromuscular re-education, 97535- Self Care, and [id number]- Manual therapy  PLAN FOR NEXT SESSION:  Discharge   Connell Kiss, PT, DPT, NCS, CSRS Physical Therapist - [place]  [facility]  2:57 PM 03/13/24

## 2024-03-20 ENCOUNTER — Encounter (HOSPITAL_COMMUNITY): Payer: Self-pay | Admitting: Emergency Medicine

## 2024-03-20 ENCOUNTER — Emergency Department (HOSPITAL_COMMUNITY)

## 2024-03-20 ENCOUNTER — Ambulatory Visit: Admitting: Physical Therapy

## 2024-03-20 ENCOUNTER — Emergency Department (HOSPITAL_COMMUNITY)
Admission: EM | Admit: 2024-03-20 | Discharge: 2024-03-20 | Disposition: A | Discharge status: Home / Self Care | Attending: Emergency Medicine | Admitting: Emergency Medicine

## 2024-03-20 ENCOUNTER — Encounter

## 2024-03-20 ENCOUNTER — Other Ambulatory Visit: Payer: Self-pay

## 2024-03-20 DIAGNOSIS — I2510 Atherosclerotic heart disease of native coronary artery without angina pectoris: Secondary | ICD-10-CM | POA: Insufficient documentation

## 2024-03-20 DIAGNOSIS — F0390 Unspecified dementia without behavioral disturbance: Secondary | ICD-10-CM | POA: Diagnosis not present

## 2024-03-20 DIAGNOSIS — R079 Chest pain, unspecified: Secondary | ICD-10-CM | POA: Insufficient documentation

## 2024-03-20 DIAGNOSIS — Z7901 Long term (current) use of anticoagulants: Secondary | ICD-10-CM | POA: Insufficient documentation

## 2024-03-20 DIAGNOSIS — R55 Syncope and collapse: Secondary | ICD-10-CM | POA: Diagnosis present

## 2024-03-20 LAB — COMPREHENSIVE METABOLIC PANEL WITH GFR
ALT: 21 U/L (ref 0–44)
AST: 28 U/L (ref 15–41)
Albumin: 3.4 g/dL — ABNORMAL LOW (ref 3.5–5.0)
Alkaline Phosphatase: 64 U/L (ref 38–126)
Anion gap: 11 (ref 5–15)
BUN: 19 mg/dL (ref 8–23)
CO2: 27 mmol/L (ref 22–32)
Calcium: 8.4 mg/dL — ABNORMAL LOW (ref 8.9–10.3)
Chloride: 101 mmol/L (ref 98–111)
Creatinine, Ser: 1.22 mg/dL (ref 0.61–1.24)
GFR, Estimated: 57 mL/min — ABNORMAL LOW (ref >60)
Glucose, Bld: 138 mg/dL — ABNORMAL HIGH (ref 70–99)
Potassium: 3.6 mmol/L (ref 3.5–5.1)
Sodium: 139 mmol/L (ref 135–145)
Total Bilirubin: 1.4 mg/dL — ABNORMAL HIGH (ref 0.0–1.2)
Total Protein: 6.2 g/dL — ABNORMAL LOW (ref 6.5–8.1)

## 2024-03-20 LAB — CBC
HCT: 30.9 % — ABNORMAL LOW (ref 39.0–52.0)
Hemoglobin: 10.5 g/dL — ABNORMAL LOW (ref 13.0–17.0)
MCH: 30.1 pg (ref 26.0–34.0)
MCHC: 34 g/dL (ref 30.0–36.0)
MCV: 88.5 fL (ref 80.0–100.0)
Platelets: 190 K/uL (ref 150–400)
RBC: 3.49 MIL/uL — ABNORMAL LOW (ref 4.22–5.81)
RDW: 14.2 % (ref 11.5–15.5)
WBC: 5.6 K/uL (ref 4.0–10.5)
nRBC: 0 % (ref 0.0–0.2)

## 2024-03-20 LAB — TROPONIN I (HIGH SENSITIVITY)
Troponin I (High Sensitivity): 17 ng/L (ref <18)
Troponin I (High Sensitivity): 17 ng/L (ref <18)

## 2024-03-20 NOTE — ED Provider Notes (Signed)
 [place] EMERGENCY DEPARTMENT AT [facility] Provider Note   CSN: [id number] Arrival date & time: 03/20/24  0543     Patient presents with: Chest Pain and Near Syncope   Alexander Donaldson is a 89 y.o. male.   HPI Nursing triage note reviewed Patient with history of dementia Patient was brought to the ED by [facility] with reports that he had a near syncopal episode when he was coming back from the bathroom this morning.  Wife reports that the patient clutched his chest during this time. Patient has reported cardiac history.  Patient denying pain on initial evaluation Prehospital report states twelve-lead unremarkable, blood pressure 132/78, normal oxygen  saturations, CBG 153, patient received aspirin  and nitroglycerin  prehospital per report unclear why patient received nitroglycerin     Wife is at bedside.  She states that this morning around 430 the patient got up to go to the bathroom.  She helped him to the bathroom and back to bed as per usual.  She states that he sat back down in the bed he became diaphoretic.  He grabbed at his chest but then also had his head.  She states that she eased him back on the bed and gave him a peppermint.  She called 911.  While on the phone with 911 she gave him nitro as per their instructions.  She states by the time EMS got there he was returning back to normal.  She states that he did not completely lose consciousness.  She did not note any seizure activity.  He was back to baseline quickly.  Prior to Admission medications   Medication Sig Start Date End Date Taking? Authorizing Provider  acetaminophen  (TYLENOL ) 325 MG tablet Take 2 tablets (650 mg total) by mouth every 4 (four) hours as needed for mild pain (pain score 1-3) (or temp > 37.5 C (99.5 F)). 09/13/23   Sherrill Cable Latif, DO  amiodarone  (PACERONE ) 200 MG tablet Take 1 tablet (200 mg total) by mouth daily. 07/05/23   Levern Hutching, MD  amLODIPine  Benzoate 1 MG/ML SUSP  Take 10 mg by mouth 2 (two) times daily. 10/11/23   [provider]  apixaban  (ELIQUIS ) 5 MG TABS tablet Take 1 tablet (5 mg total) by mouth 2 (two) times daily. 09/13/23   Sherrill Cable Latif, DO  Atorvastatin  Calcium  20 MG/5ML SUSP Take 20 mg by mouth daily.    [provider]  Memantine  HCl 10 MG/5ML SOLN Take 5 mLs by mouth 2 (two) times daily at 10 AM and 5 PM. 06/22/23   Sethi, Pramod S, MD  Multiple Vitamin (MULTIVITAMIN) tablet Take 1 tablet by mouth daily with breakfast.    [provider]  nitroGLYCERIN  (NITROSTAT ) 0.4 MG SL tablet Place 1 tablet (0.4 mg total) under the tongue every 5 (five) minutes x 3 doses as needed for chest pain. 03/18/12   Levern Hutching, MD  polyethylene glycol (MIRALAX  / GLYCOLAX ) 17 g packet Take 17 g by mouth in the morning. 08/07/23   [provider]    Allergies: Lisinopril and Metformin     Review of Systems  Updated Vital Signs BP 106/71   Pulse 64   Temp 98 F (36.7 C) (Oral)   Resp 18   Wt 68 kg   SpO2 99%   BMI 25.73 kg/m   Physical Exam Vitals and nursing note reviewed.  Constitutional:      General: He is not in acute distress.    Appearance: He is well-developed.  HENT:     Head: Normocephalic.  Eyes:     Pupils: Pupils are equal, round, and reactive to light.  Cardiovascular:     Rate and Rhythm: Normal rate and regular rhythm.     Heart sounds: Normal heart sounds.  Pulmonary:     Effort: Pulmonary effort is normal.     Breath sounds: Normal breath sounds.  Abdominal:     Palpations: Abdomen is soft.  Musculoskeletal:        General: Normal range of motion.     Cervical back: Normal range of motion.     Right lower leg: No tenderness. No edema.     Left lower leg: No tenderness. No edema.  Skin:    General: Skin is warm and dry.     Capillary Refill: Capillary refill takes less than 2 seconds.  Neurological:     General: No focal deficit present.     Mental Status: He is alert.      (all labs ordered are listed, but only abnormal results are displayed) Labs Reviewed  CBC - Abnormal; Notable for the following components:      Result Value   RBC 3.49 (*)    Hemoglobin 10.5 (*)    HCT 30.9 (*)    All other components within normal limits  COMPREHENSIVE METABOLIC PANEL WITH GFR - Abnormal; Notable for the following components:   Glucose, Bld 138 (*)    Calcium  8.4 (*)    Total Protein 6.2 (*)    Albumin 3.4 (*)    Total Bilirubin 1.4 (*)    GFR, Estimated 57 (*)    All other components within normal limits  TROPONIN I (HIGH SENSITIVITY)  TROPONIN I (HIGH SENSITIVITY)    EKG: EKG Interpretation Date/Time:  Wednesday March 20 2024 05:53:49 EDT Ventricular Rate:  63 PR Interval:  144 QRS Duration:  90 QT Interval:  442 QTC Calculation: 452 R Axis:   -21  Text Interpretation: Normal sinus rhythm Possible Anterior infarct , age undetermined Abnormal ECG When compared with ECG of 10-Sep-2023 19:00, PREVIOUS ECG IS PRESENT Confirmed by Levander Houston [phone]) on 03/20/2024 8:08:23 AM  Radiology: CT Head Wo Contrast Result Date: 03/20/2024 CLINICAL DATA:  Provided history: Headache, new onset. Additional history provided: Syncope, history of dementia. EXAM: CT HEAD WITHOUT CONTRAST TECHNIQUE: Contiguous axial images were obtained from the base of the skull through the vertex without intravenous contrast. RADIATION DOSE REDUCTION: This exam was performed according to the departmental dose-optimization program which includes automated exposure control, adjustment of the mA and/or kV according to patient size and/or use of iterative reconstruction technique. COMPARISON:  Head CT 09/11/2023.  Brain MRI 09/04/2023. FINDINGS: Brain: Advanced cerebral atrophy. Comparatively mild cerebellar atrophy. Commensurate prominence of the ventricles and sulci. Unchanged small chronic infarcts within the bilateral frontal lobes. Background moderate patchy and ill-defined  hypoattenuation within the cerebral white matter, nonspecific but compatible chronic small vessel ischemic disease. Asymmetrically prominent atrophy versus chronic encephalomalacia affecting the anterior left temporal lobe, unchanged. A known chronic infarct within the right aspect of the pons is not well appreciated on today's study. There is no acute intracranial hemorrhage. No acute demarcated cortical infarct. No extra-axial fluid collection. No evidence of an intracranial mass. No midline shift. Vascular: No hyperdense vessel.  Atherosclerotic calcifications. Skull: No calvarial fracture or aggressive osseous lesion. Sinuses/Orbits: No mass or acute finding within the imaged orbits. Minor bilateral ethmoid sinus disease. IMPRESSION: 1. No evidence of an acute intracranial abnormality. 2. Parenchymal atrophy,  chronic small vessel ischemic disease and unchanged chronic infarcts, as described. 3. Asymmetrically prominent atrophy versus chronic encephalomalacia affecting the anterior left temporal lobe, unchanged. Electronically Signed   By: Rockey Childs D.O.   On: 03/20/2024 09:08   DG Chest 2 View Result Date: 03/20/2024 EXAM: 2 VIEW(S) XRAY OF THE CHEST 03/20/2024 06:12:00 AM COMPARISON: 07/02/2023 CLINICAL HISTORY: Chest pain. Encounter for chest pain. FINDINGS: LUNGS AND PLEURA: No focal pulmonary opacity. No pulmonary edema. No pleural effusion. No pneumothorax. Low lung volumes. HEART AND MEDIASTINUM: No acute abnormality of the cardiac and mediastinal silhouettes. Calcified aorta. BONES AND SOFT TISSUES: No acute osseous abnormality. Thoracic degenerative changes. IMPRESSION: 1. No acute findings. 2. Low lung volumes. Electronically signed by: Waddell Calk MD 03/20/2024 06:32 AM EDT RP Workstation: [id number]     Procedures   Medications Ordered in the ED - No data to display  Clinical Course as of 03/20/24 0919  Wed Mar 20, 2024  0809 CBC reviewed interpreted anemia noted which is stable from  first prior with hemoglobin of 10.5 [DR]  0809 Complete metabolic panel is reviewed interpreted seen for mild hyperglycemia glucose 138, calcium  8.4, total protein albumin reduced with total bilirubin elevated at 1.4.  All of the above are stable with the exception of bilirubin being elevated which was not seen on prior labs [DR]  0810 First troponin obtained is normal at 17 [DR]  0810 Chest x-Mordechai Matuszak was reviewed and interpreted no evidence of acute abnormality noted radiologist interpretation concurs [DR]  0914 Troponin is stable at 17 [DR]  0915 CT head reviewed and interpreted with no acute abnormalities noted and radiologist interpretation concurs [DR]    Clinical Course User Index [DR] Levander Houston, MD                                 Medical Decision Making Amount and/or Complexity of Data Reviewed Labs: ordered. Radiology: ordered.   89 year old man history of coronary disease presents today with episode of near syncope. Differential diagnosis includes but is not limited to arrhythmia, seizure, hypotension, anemia, electrolyte abnormalities  patient's wife was present.  Patient is very poor historian and not very communicative due to his advanced dementia.  Patient reportedly put his hands to his chest and to his head.  Had an episode of not responding as much as usual.  He was sitting on the bed when this occurred.  His wife lowered him back.  He came back to baseline without interventions.  He is unable to give any further history regarding this episode.  He currently does not appear to be complaining of anything.  His workup here shows normal troponins and no acute changes on EKG.  This does not appear to represent an MI.  His remainder of the labs show stable anemia.  His wife had suspected that he may have had an episode of hypoglycemia.  There was no recorded hypoglycemia but she did give him candy prior to any blood sugar being checked.  His blood sugar has remained above 100 here. He  has an elevated total bilirubin at 1.4 but normal transaminases and does not appear to have any abdominal pain and no report that he has had any change in his appetite. Because of the clutching of his head, CT was obtained.  No acute abnormalities are noted. Patient appears stable for discharge. Wife advised of return precautions and need for follow-up     Final  diagnoses:  Near syncope    ED Discharge Orders     None          Levander Houston, MD 03/20/24 [phone]

## 2024-03-20 NOTE — Discharge Instructions (Addendum)
 Alexander Donaldson was evaluated today for weakness and passing out.  Patient was evaluated with heart tracing and heart enzymes that are within normal limits.  Head CT was obtained due to clutching his head and being on blood thinners.  There is no evidence of bleeding or new abnormalities in his brain. He was evaluated with blood count which shows a stable chronic anemia He is evaluated with electrolytes and blood sugars which are within normal limits His bilirubin is slightly elevated but is of unclear and significance.  If he begins to have belly pain, nausea, or vomiting he should be reevaluated for this. Please follow-up with his doctor as soon as possible. Return if he is having any new or worsening symptoms

## 2024-03-20 NOTE — ED Provider Triage Note (Signed)
 Emergency Medicine Provider Triage Evaluation Note  Alexander Donaldson , a 89 y.o. male  was evaluated in triage.  Pt presenting via EMS for evaluation of near syncope. Wife reported that patient was walking back from the bathroom and grabbing his chest. Patient currently denies pain and SOB. Unable to contribute further to hx 2/2 dementia.  Hx of CAD, ICM, HTN, DM. Echo in Feb 2025 w/LVEF 60-65%, grade I diastolic dysfunction, mild AS  Review of Systems  Positive: As above Negative: As above  Physical Exam  BP 106/71   Pulse 64   Temp 98 F (36.7 C) (Oral)   Resp 18   Wt 68 kg   SpO2 99%   BMI 25.73 kg/m  Gen:   Awake, no distress   Resp:  Normal effort  MSK:   Moves extremities without difficulty  Other:  Lungs CTAB. Heart RRR.  Medical Decision Making  Medically screening exam initiated at 6:15 AM.  Appropriate orders placed.  Kayla LITTIE Search was informed that the remainder of the evaluation will be completed by another provider, this initial triage assessment does not replace that evaluation, and the importance of remaining in the ED until their evaluation is complete.  Work up initiated. VSS.   Keith Sor, PA-C 03/20/24 [phone]

## 2024-03-20 NOTE — ED Triage Notes (Signed)
 Patient BIB [facility] from home c/o near syncopal episode while coming back from the restroom this morning.  Patient's wife reports that patient was grabbing his chest during this event.  Patient has cardiac history and also has dementia, and is at baseline. Patient denies pain.  12 lead unremarkable 132/78 98% CBG 153 423 asa .4 mg nitroglycerin  18 R FA

## 2024-03-27 ENCOUNTER — Encounter

## 2024-03-27 ENCOUNTER — Ambulatory Visit: Admitting: Physical Therapy

## 2024-04-03 ENCOUNTER — Encounter: Admitting: Occupational Therapy

## 2024-04-03 ENCOUNTER — Ambulatory Visit: Admitting: Physical Therapy

## 2024-04-10 ENCOUNTER — Encounter

## 2024-04-10 ENCOUNTER — Ambulatory Visit: Admitting: Physical Therapy

## 2024-04-17 ENCOUNTER — Encounter: Admitting: Occupational Therapy

## 2024-04-17 ENCOUNTER — Ambulatory Visit: Admitting: Physical Therapy

## 2024-04-24 ENCOUNTER — Ambulatory Visit: Admitting: Physical Therapy

## 2024-04-24 ENCOUNTER — Encounter

## 2024-04-30 ENCOUNTER — Other Ambulatory Visit (HOSPITAL_COMMUNITY)
Admission: RE | Admit: 2024-04-30 | Discharge: 2024-04-30 | Disposition: A | Discharge status: Home / Self Care | Source: Ambulatory Visit | Attending: Internal Medicine | Admitting: Internal Medicine

## 2024-04-30 DIAGNOSIS — E782 Mixed hyperlipidemia: Secondary | ICD-10-CM | POA: Insufficient documentation

## 2024-04-30 DIAGNOSIS — I129 Hypertensive chronic kidney disease with stage 1 through stage 4 chronic kidney disease, or unspecified chronic kidney disease: Secondary | ICD-10-CM | POA: Diagnosis not present

## 2024-04-30 DIAGNOSIS — E1122 Type 2 diabetes mellitus with diabetic chronic kidney disease: Secondary | ICD-10-CM | POA: Insufficient documentation

## 2024-04-30 DIAGNOSIS — N189 Chronic kidney disease, unspecified: Secondary | ICD-10-CM | POA: Diagnosis not present

## 2024-04-30 LAB — BASIC METABOLIC PANEL WITH GFR
Anion gap: 9 (ref 5–15)
BUN: 22 mg/dL (ref 8–23)
CO2: 25 mmol/L (ref 22–32)
Calcium: 8.6 mg/dL — ABNORMAL LOW (ref 8.9–10.3)
Chloride: 108 mmol/L (ref 98–111)
Creatinine, Ser: 1.38 mg/dL — ABNORMAL HIGH (ref 0.61–1.24)
GFR, Estimated: 49 mL/min — ABNORMAL LOW (ref >60)
Glucose, Bld: 157 mg/dL — ABNORMAL HIGH (ref 70–99)
Potassium: 3.1 mmol/L — ABNORMAL LOW (ref 3.5–5.1)
Sodium: 142 mmol/L (ref 135–145)

## 2024-04-30 LAB — LIPID PANEL
Cholesterol: 137 mg/dL (ref 0–200)
HDL: 42 mg/dL (ref >40)
LDL Cholesterol: 82 mg/dL (ref 0–99)
Total CHOL/HDL Ratio: 3.3 ratio
Triglycerides: 63 mg/dL (ref <150)
VLDL: 13 mg/dL (ref 0–40)

## 2024-04-30 LAB — HEPATIC FUNCTION PANEL
ALT: 23 U/L (ref 0–44)
AST: 21 U/L (ref 15–41)
Albumin: 3.1 g/dL — ABNORMAL LOW (ref 3.5–5.0)
Alkaline Phosphatase: 73 U/L (ref 38–126)
Bilirubin, Direct: 0.1 mg/dL (ref 0.0–0.2)
Indirect Bilirubin: 0.7 mg/dL (ref 0.3–0.9)
Total Bilirubin: 0.8 mg/dL (ref 0.0–1.2)
Total Protein: 6.7 g/dL (ref 6.5–8.1)

## 2024-04-30 LAB — HEMOGLOBIN A1C
Hgb A1c MFr Bld: 7.5 % — ABNORMAL HIGH (ref 4.8–5.6)
Mean Plasma Glucose: 168.55 mg/dL

## 2024-05-01 ENCOUNTER — Ambulatory Visit: Admitting: Physical Therapy

## 2024-05-01 ENCOUNTER — Encounter: Admitting: Occupational Therapy

## 2024-05-08 ENCOUNTER — Encounter: Admitting: Occupational Therapy

## 2024-05-08 ENCOUNTER — Ambulatory Visit: Admitting: Physical Therapy

## 2024-05-13 ENCOUNTER — Telehealth: Payer: Self-pay | Admitting: Neurology

## 2024-05-13 ENCOUNTER — Ambulatory Visit: Admitting: Podiatry

## 2024-05-13 NOTE — Telephone Encounter (Signed)
 LVM and sent mychart msg informing pt of need to reschedule 07/01/24 appt - MD out

## 2024-05-15 ENCOUNTER — Encounter

## 2024-05-15 ENCOUNTER — Ambulatory Visit: Admitting: Physical Therapy

## 2024-05-22 ENCOUNTER — Encounter

## 2024-05-22 ENCOUNTER — Ambulatory Visit: Admitting: Physical Therapy

## 2024-05-27 ENCOUNTER — Ambulatory Visit: Admitting: Podiatry

## 2024-05-27 DIAGNOSIS — M79675 Pain in left toe(s): Secondary | ICD-10-CM | POA: Diagnosis not present

## 2024-05-27 DIAGNOSIS — M79674 Pain in right toe(s): Secondary | ICD-10-CM | POA: Diagnosis not present

## 2024-05-27 DIAGNOSIS — E1159 Type 2 diabetes mellitus with other circulatory complications: Secondary | ICD-10-CM | POA: Diagnosis not present

## 2024-05-27 DIAGNOSIS — B351 Tinea unguium: Secondary | ICD-10-CM

## 2024-05-29 ENCOUNTER — Ambulatory Visit: Admitting: Physical Therapy

## 2024-05-29 ENCOUNTER — Encounter

## 2024-06-02 ENCOUNTER — Encounter: Payer: Self-pay | Admitting: Podiatry

## 2024-06-02 NOTE — Progress Notes (Signed)
  Subjective:  Patient ID: Alexander Donaldson, male    DOB: 1933/09/02,  MRN: [id number]  Alexander Donaldson presents to clinic today for at risk foot care. Pt has h/o NIDDM with PAD and painful mycotic toenails x 10 which interfere with daily activities. Pain is relieved with periodic professional debridement.  Chief Complaint  Patient presents with   Diabetes    A1c 7.5 Dr. Jonda is his PCP   New problem(s): None.   PCP is Jonda Rush, MD.  Allergies  Allergen Reactions   Lisinopril Cough   Metformin  Diarrhea    Review of Systems: Negative except as noted in the HPI.  Objective: No changes noted in today's physical examination. There were no vitals filed for this visit. Alexander Donaldson is a pleasant 89 y.o. male in NAD. AAO x 3.  Vascular Examination: CFT <3 seconds b/l LE. Faintly palpable DP pulses b/l LE. Nonpalpable PT pulse(s) b/l LE. Pedal hair absent. No pain with calf compression b/l. Lower extremity skin temperature gradient within normal limits. No cyanosis or clubbing noted b/l LE.  Neurological Examination: Patient unable to complete sensory examination due to cognition but does respond to external noxious stimuli.   Pt has subjective symptoms of neuropathy.  Dermatological Examination: Pedal skin with normal turgor, texture and tone b/l. Toenails 1-5 b/l thick, discolored, elongated with subungual debris and pain on dorsal palpation. No hyperkeratotic lesions noted b/l.   Musculoskeletal Examination: Muscle strength 5/5 to b/l LE. Hammertoe deformity noted 2-5 b/l. Utilizes wheelchair for mobility assistance.  Radiographs: None  Assessment/Plan: 1. Pain due to onychomycosis of toenails of both feet   2. Type 2 diabetes mellitus with vascular disease [facility])     Consent given for treatment. Patient examined. All patient's and/or POA's questions/concerns addressed on today's visit. Toenails 1-5 b/l debrided in length and girth without incident. Continue foot and shoe  inspections daily. Monitor blood glucose per PCP/Endocrinologist's recommendations. Continue soft, supportive shoe gear daily. Report any pedal injuries to medical professional. Call office if there are any questions/concerns. -Patient/POA to call should there be question/concern in the interim.   Return in about 3 months (around 08/27/2024).  Delon LITTIE Merlin, DPM      [facility] LOCATION: [address] [place] [address]                   Office [phone]   [facility] LOCATION: [address] [place], [place] [address] Office [phone]

## 2024-06-05 ENCOUNTER — Ambulatory Visit: Admitting: Physical Therapy

## 2024-06-05 ENCOUNTER — Encounter

## 2024-06-12 ENCOUNTER — Ambulatory Visit: Admitting: Physical Therapy

## 2024-06-12 ENCOUNTER — Encounter

## 2024-06-19 ENCOUNTER — Ambulatory Visit: Admitting: Physical Therapy

## 2024-06-19 ENCOUNTER — Encounter

## 2024-06-26 ENCOUNTER — Encounter

## 2024-06-26 ENCOUNTER — Ambulatory Visit: Admitting: Physical Therapy

## 2024-07-01 ENCOUNTER — Ambulatory Visit: Payer: Non-veteran care | Admitting: Neurology

## 2024-07-03 ENCOUNTER — Encounter

## 2024-07-10 ENCOUNTER — Encounter

## 2024-07-10 ENCOUNTER — Ambulatory Visit: Admitting: Physical Therapy

## 2024-07-17 ENCOUNTER — Encounter

## 2024-07-17 ENCOUNTER — Ambulatory Visit: Admitting: Physical Therapy

## 2024-07-24 ENCOUNTER — Encounter

## 2024-07-24 ENCOUNTER — Ambulatory Visit: Admitting: Physical Therapy

## 2024-07-30 ENCOUNTER — Telehealth: Payer: Self-pay

## 2024-07-30 NOTE — Transitions of Care (Post Inpatient/ED Visit) (Unsigned)
" ° °  07/30/2024  Name: Alexander Donaldson MRN: [id number] DOB: 1934/04/18  Today's TOC FU Call Status: Today's TOC FU Call Status:: Unsuccessful Call (1st Attempt) Unsuccessful Call (1st Attempt) Date: 07/30/24  Attempted to reach the patient regarding the most recent Inpatient/ED visit.  Follow Up Plan: Additional outreach attempts will be made to reach the patient to complete the Transitions of Care (Post Inpatient/ED visit) call.   Signature Julian Lemmings, LPN [facility] Nurse Health Advisor Direct Dial [phone]  "

## 2024-07-31 NOTE — Transitions of Care (Post Inpatient/ED Visit) (Signed)
 "  07/31/2024  Name: Alexander Donaldson MRN: [id number] DOB: July 28, 1933  Today's TOC FU Call Status: Today's TOC FU Call Status:: Successful TOC FU Call Completed Unsuccessful Call (1st Attempt) Date: 07/30/24 [facility] FU Call Complete Date: 07/31/24  Patient's Name and Date of Birth confirmed. Name, DOB  Transition Care Management Follow-up Telephone Call Date of Discharge: 07/29/24 Discharge Facility: Other [facility]) Name of Other (Non[facility]) Discharge Facility: [facility] Type of Discharge: Inpatient Admission Primary Inpatient Discharge Diagnosis:: dyshgia How have you been since you were released from the hospital?: Better Any questions or concerns?: No  Items Reviewed: Did you receive and understand the discharge instructions provided?: Yes Medications obtained,verified, and reconciled?: Yes (Medications Reviewed) Any new allergies since your discharge?: No Dietary orders reviewed?: Yes Do you have support at home?: Yes People in Home [RPT]: spouse  Medications Reviewed Today: Medications Reviewed Today     Reviewed by Emmitt Pan, LPN (Licensed Practical Nurse) on 07/31/24 at 1050  Med List Status: <None>   Medication Order Taking? Sig Documenting Provider Last Dose Status Informant  acetaminophen  (TYLENOL ) 325 MG tablet [id number] Yes Take 2 tablets (650 mg total) by mouth every 4 (four) hours as needed for mild pain (pain score 1-3) (or temp > 37.5 C (99.5 F)). Sheikh, [place], [place]  Active   amiodarone  (PACERONE ) 200 MG tablet [id number] Yes Take 1 tablet (200 mg total) by mouth daily. Levern Hutching, MD  Active Spouse/Significant Other  amLODIPine  Benzoate 1 MG/ML SUSP [id number] Yes Take 10 mg by mouth 2 (two) times daily. [provider]  Active   apixaban  (ELIQUIS ) 5 MG TABS tablet [id number] Yes Take 1 tablet (5 mg total) by mouth 2 (two) times daily. Sherrill Cable [place], [place]  Active   Atorvastatin  Calcium  20 MG/5ML SUSP [id number] Yes Take 20 mg by mouth  daily. [provider]  Active   Memantine  HCl 10 MG/5ML SOLN [id number] Yes Take 5 mLs by mouth 2 (two) times daily at 10 AM and 5 PM. Sethi, Pramod S, MD  Active Spouse/Significant Other  Multiple Vitamin (MULTIVITAMIN) tablet [id number] Yes Take 1 tablet by mouth daily with breakfast. [provider]  Active Spouse/Significant Other  nitroGLYCERIN  (NITROSTAT ) 0.4 MG SL tablet [id number] Yes Place 1 tablet (0.4 mg total) under the tongue every 5 (five) minutes x 3 doses as needed for chest pain. Levern Hutching, MD  Active Spouse/Significant Other           Med Note MARISA, NATHANEL SAILOR   Sun Sep 10, 2023  7:50 PM)    polyethylene glycol (MIRALAX  / GLYCOLAX ) 17 g packet [id number] Yes Take 17 g by mouth in the morning. [provider]  Active Spouse/Significant Other           Med Note (LEE, NICOLE   Mon Sep 04, 2023  1:44 AM)              Home Care and Equipment/Supplies: Were Home Health Services Ordered?: NA Any new equipment or medical supplies ordered?: NA  Functional Questionnaire: Do you need assistance with bathing/showering or dressing?: Yes Do you need assistance with meal preparation?: Yes Do you need assistance with eating?: No Do you have difficulty maintaining continence: No Do you need assistance with getting out of bed/getting out of a chair/moving?: No Do you have difficulty managing or taking your medications?: No  Follow up appointments reviewed: PCP Follow-up appointment confirmed?: No (respite care) MD Provider Line Number:[phone] Given: No Specialist Hospital Follow-up appointment confirmed?: NA Do  you need transportation to your follow-up appointment?: No Do you understand care options if your condition(s) worsen?: Yes-patient verbalized understanding    SIGNATURE Julian Lemmings, LPN [facility] Nurse Health Advisor Direct Dial [phone]  "

## 2024-08-01 NOTE — Telephone Encounter (Signed)
 Message sent to wrong office; tried routing to pcp on epic but does not show up. Not sure what to do with message now.

## 2024-08-01 NOTE — Telephone Encounter (Signed)
 Copied from CRM #[id number]. Topic: Referral - Request for Referral >> Aug 01, 2024  2:26 PM Harlene ORN wrote: [facility] - Dr. Carlos, Cardiology called. needs a cardiology referral for the patient.  Phone: [phone] Fax: [phone]

## 2024-08-07 ENCOUNTER — Telehealth (INDEPENDENT_AMBULATORY_CARE_PROVIDER_SITE_OTHER): Admitting: Primary Care

## 2024-08-07 VITALS — Temp 98.2°F

## 2024-08-07 DIAGNOSIS — J069 Acute upper respiratory infection, unspecified: Secondary | ICD-10-CM

## 2024-08-07 NOTE — Assessment & Plan Note (Signed)
 HPI and exam today representative viral etiology. Fortunately, he appears well and is not feeling sickly  We discussed conservative treatment such as continuing Delsym , lemon tea. We also discussed initiation of Mucinex  water.  His wife will update if his symptoms have not improved or progressed in a few days.

## 2024-08-07 NOTE — Patient Instructions (Signed)
 Continue Delsym  and lemon tea. Start regular mucinex  for congestion.  Please update me Friday if symptoms are worse.  It was a pleasure to see you today!

## 2024-08-07 NOTE — Progress Notes (Signed)
 "   Patient ID: Alexander Donaldson, male    DOB: 10/09/1933, 89 y.o.   MRN: [id number]  Virtual visit completed through caregility, a video enabled telemedicine application. Due to national recommendations of social distancing due to COVID-19, a virtual visit is felt to be most appropriate for this patient at this time. Reviewed limitations, risks, security and privacy concerns of performing a virtual visit and the availability of in person appointments. I also reviewed that there may be a patient responsible charge related to this service. The patient agreed to proceed.   Patient location: home Provider location: [place] [facility], office Persons participating in this virtual visit: patient, provider   If any vitals were documented, they were collected by patient at home unless specified below.    Temp 98.2 F (36.8 C) Comment: per patient   CC: acute cough Subjective:   HPI: Alexander Donaldson is a 89 y.o. male with a history of CAD, hypertension, atrial fibrillation, CHF, dementia, CVApresenting on 08/07/2024 for Cough (Pt complains of cough that started last Thursday. Pt states of having difficulty getting a cough out. States of wheezing. Has taken delysm )  Symptom onset 6 days ago with rhinorrhea. He then developed a hacking cough for which is intermittent. His wife has noticed that he has difficulty coughing up sputum and mild wheezing. Overall he feels fine, not sick. He has a good appetite and there has been no changes in mental status.   He's taken Delsym  every 12 hours and lemon tea which helps. His wife had the same symptoms last week but is feeling much better.       Relevant past medical, surgical, family and social history reviewed and updated as indicated. Interim medical history since our last visit reviewed. Allergies and medications reviewed and updated. Outpatient Medications Prior to Visit  Medication Sig Dispense Refill   acetaminophen  (TYLENOL ) 325 MG tablet Take 2  tablets (650 mg total) by mouth every 4 (four) hours as needed for mild pain (pain score 1-3) (or temp > 37.5 C (99.5 F)). 20 tablet 0   amiodarone  (PACERONE ) 200 MG tablet Take 1 tablet (200 mg total) by mouth daily. 30 tablet 3   amLODIPine  Benzoate 1 MG/ML SUSP Take 10 mg by mouth 2 (two) times daily.     apixaban  (ELIQUIS ) 5 MG TABS tablet Take 1 tablet (5 mg total) by mouth 2 (two) times daily.     Atorvastatin  Calcium  20 MG/5ML SUSP Take 20 mg by mouth daily.     Memantine  HCl 10 MG/5ML SOLN Take 5 mLs by mouth 2 (two) times daily at 10 AM and 5 PM. 300 mL 3   Multiple Vitamin (MULTIVITAMIN) tablet Take 1 tablet by mouth daily with breakfast.     nitroGLYCERIN  (NITROSTAT ) 0.4 MG SL tablet Place 1 tablet (0.4 mg total) under the tongue every 5 (five) minutes x 3 doses as needed for chest pain. 25 tablet 3   polyethylene glycol (MIRALAX  / GLYCOLAX ) 17 g packet Take 17 g by mouth in the morning.     No facility-administered medications prior to visit.     Per HPI unless specifically indicated in ROS section below Review of Systems  Constitutional:  Negative for chills and fever.  HENT:  Positive for congestion and rhinorrhea. Negative for sore throat.   Respiratory:  Positive for cough.    Objective:  Temp 98.2 F (36.8 C) Comment: per patient  Wt Readings from Last 3 Encounters:  03/20/24 149 lb  14.6 oz (68 kg)  02/22/24 149 lb (67.6 kg)  12/07/23 149 lb (67.6 kg)       Physical exam: General: Alert, no distress, does not appear sickly  Pulmonary:No increased work of breathing, no cough during visit.  Psychiatric: Normal mood,.     Results for orders placed or performed during the hospital encounter of 04/30/24  Lipid panel   Collection Time: 04/30/24 10:57 AM  Result Value Ref Range   Cholesterol 137 0 - 200 mg/dL   Triglycerides 63 <849 mg/dL   HDL 42 >59 mg/dL   Total CHOL/HDL Ratio 3.3 RATIO   VLDL 13 0 - 40 mg/dL   LDL Cholesterol 82 0 - 99 mg/dL  Hepatic  function panel   Collection Time: 04/30/24 10:57 AM  Result Value Ref Range   Total Protein 6.7 6.5 - 8.1 g/dL   Albumin 3.1 (L) 3.5 - 5.0 g/dL   AST 21 15 - 41 U/L   ALT 23 0 - 44 U/L   Alkaline Phosphatase 73 38 - 126 U/L   Total Bilirubin 0.8 0.0 - 1.2 mg/dL   Bilirubin, Direct 0.1 0.0 - 0.2 mg/dL   Indirect Bilirubin 0.7 0.3 - 0.9 mg/dL  Basic metabolic panel   Collection Time: 04/30/24 10:57 AM  Result Value Ref Range   Sodium 142 135 - 145 mmol/L   Potassium 3.1 (L) 3.5 - 5.1 mmol/L   Chloride 108 98 - 111 mmol/L   CO2 25 22 - 32 mmol/L   Glucose, Bld 157 (H) 70 - 99 mg/dL   BUN 22 8 - 23 mg/dL   Creatinine, Ser 8.61 (H) 0.61 - 1.24 mg/dL   Calcium  8.6 (L) 8.9 - 10.3 mg/dL   GFR, Estimated 49 (L) >60 mL/min   Anion gap 9 5 - 15  Hemoglobin A1c   Collection Time: 04/30/24 10:58 AM  Result Value Ref Range   Hgb A1c MFr Bld 7.5 (H) 4.8 - 5.6 %   Mean Plasma Glucose 168.55 mg/dL   Assessment & Plan:   Problem List Items Addressed This Visit       Respiratory   Viral URI with cough - Primary   HPI and exam today representative viral etiology. Fortunately, he appears well and is not feeling sickly  We discussed conservative treatment such as continuing Delsym , lemon tea. We also discussed initiation of Mucinex  water.  His wife will update if his symptoms have not improved or progressed in a few days.        No orders of the defined types were placed in this encounter.  No orders of the defined types were placed in this encounter.   I discussed the assessment and treatment plan with the patient. The patient was provided an opportunity to ask questions and all were answered. The patient agreed with the plan and demonstrated an understanding of the instructions. The patient was advised to call back or seek an in-person evaluation if the symptoms worsen or if the condition fails to improve as anticipated.  Follow up plan:  Continue Delsym  and lemon tea. Start  regular mucinex  for congestion.  Please update me Friday if symptoms are worse.  Latia Mataya K Jaelin Fackler, NP   "

## 2024-08-08 ENCOUNTER — Ambulatory Visit: Admitting: Primary Care

## 2024-08-16 ENCOUNTER — Emergency Department (HOSPITAL_COMMUNITY)

## 2024-08-16 ENCOUNTER — Ambulatory Visit: Payer: Self-pay

## 2024-08-16 ENCOUNTER — Emergency Department (HOSPITAL_COMMUNITY)
Admission: EM | Admit: 2024-08-16 | Discharge: 2024-08-16 | Disposition: A | Discharge status: Home / Self Care | Source: Home / Self Care | Attending: Emergency Medicine | Admitting: Emergency Medicine

## 2024-08-16 DIAGNOSIS — J189 Pneumonia, unspecified organism: Secondary | ICD-10-CM

## 2024-08-16 LAB — RESP PANEL BY RT-PCR (RSV, FLU A&B, COVID)  RVPGX2
Influenza A by PCR: NEGATIVE
Influenza B by PCR: NEGATIVE
Resp Syncytial Virus by PCR: NEGATIVE
SARS Coronavirus 2 by RT PCR: NEGATIVE

## 2024-08-16 MED ORDER — BENZONATATE 100 MG PO CAPS: 100.0000 mg | ORAL_CAPSULE | Freq: Three times a day (TID) | ORAL | 0 refills | Status: AC

## 2024-08-16 MED ORDER — ACETAMINOPHEN 500 MG PO TABS
1000.0000 mg | ORAL_TABLET | Freq: Once | ORAL | Status: AC
  Administered 2024-08-16: 1000 mg via ORAL
  Filled 2024-08-16: qty 2

## 2024-08-16 MED ORDER — AZITHROMYCIN 250 MG PO TABS
500.0000 mg | ORAL_TABLET | Freq: Once | ORAL | Status: AC
  Administered 2024-08-16: 500 mg via ORAL
  Filled 2024-08-16: qty 2

## 2024-08-16 MED ORDER — ACETAMINOPHEN 500 MG PO TABS: 500.0000 mg | ORAL_TABLET | Freq: Four times a day (QID) | ORAL | 0 refills | Status: AC | PRN

## 2024-08-16 MED ORDER — AZITHROMYCIN 250 MG PO TABS: ORAL_TABLET | ORAL | 0 refills | Status: AC

## 2024-08-16 MED ORDER — DOXYCYCLINE HYCLATE 100 MG PO TABS: 100.0000 mg | ORAL_TABLET | Freq: Once | ORAL | Status: DC

## 2024-08-16 MED ORDER — AMOXICILLIN 500 MG PO CAPS: 500.0000 mg | ORAL_CAPSULE | Freq: Once | ORAL | Status: DC

## 2024-08-16 NOTE — ED Notes (Signed)
 Called lab to check on Resp panel, they had to re run it. Will be about 20 minutes till resulted.

## 2024-08-16 NOTE — Telephone Encounter (Addendum)
 Spoke with pt's wife, [name] (on dpr), relaying Kate's message. She verbalizes understanding and states pt has had low-grade fever- max 99.7, cough and SOB. Denies any appetite changes but does report pt seems occasionally confused and rambles sometimes. [name] says she's taking pt to [facility] ED.

## 2024-08-16 NOTE — Telephone Encounter (Signed)
 Noted.

## 2024-08-16 NOTE — ED Provider Notes (Signed)
 " [place] EMERGENCY DEPARTMENT AT [facility] Provider Note   CSN: [id number] Arrival date & time: 08/16/24  1652     Patient presents with: URI   Alexander Donaldson is a 89 y.o. male.   The history is provided by the spouse. No language interpreter was used.  URI    89 year old male with history of dementia, diabetes, CAD, A-fib presents to the ER accompanied by family member with concerns of cold symptoms.  Per wife who is at bedside, for more than a week patient has had cough, congestion, runny nose,.  PCP did prescribe him Mucinex .  Today he is coughing more and now running a fever.  No report of vomiting or diarrhea.  He has dementia.  He is on Eliquis .  Prior to Admission medications  Medication Sig Start Date End Date Taking? Authorizing Provider  acetaminophen  (TYLENOL ) 325 MG tablet Take 2 tablets (650 mg total) by mouth every 4 (four) hours as needed for mild pain (pain score 1-3) (or temp > 37.5 C (99.5 F)). 09/13/23   Sherrill Cable Latif, DO  amiodarone  (PACERONE ) 200 MG tablet Take 1 tablet (200 mg total) by mouth daily. 07/05/23   Levern Hutching, MD  amLODIPine  Benzoate 1 MG/ML SUSP Take 10 mg by mouth 2 (two) times daily. 10/11/23   [provider]  apixaban  (ELIQUIS ) 5 MG TABS tablet Take 1 tablet (5 mg total) by mouth 2 (two) times daily. 09/13/23   Sherrill Cable Latif, DO  Atorvastatin  Calcium  20 MG/5ML SUSP Take 20 mg by mouth daily.    [provider]  Memantine  HCl 10 MG/5ML SOLN Take 5 mLs by mouth 2 (two) times daily at 10 AM and 5 PM. 06/22/23   Sethi, Pramod S, MD  Multiple Vitamin (MULTIVITAMIN) tablet Take 1 tablet by mouth daily with breakfast.    [provider]  nitroGLYCERIN  (NITROSTAT ) 0.4 MG SL tablet Place 1 tablet (0.4 mg total) under the tongue every 5 (five) minutes x 3 doses as needed for chest pain. 03/18/12   Levern Hutching, MD  polyethylene glycol (MIRALAX  / GLYCOLAX ) 17 g packet Take 17 g by mouth in the morning.  08/07/23   [provider]    Allergies: Lisinopril and Metformin     Review of Systems  Unable to perform ROS: Dementia    Updated Vital Signs BP (!) 151/79 (BP Location: Right Arm)   Pulse 84   Temp (!) 100.7 F (38.2 C) (Oral)   Resp 18   SpO2 94%   Physical Exam Constitutional:      General: He is not in acute distress.    Appearance: He is well-developed.  HENT:     Head: Atraumatic.  Eyes:     Conjunctiva/sclera: Conjunctivae normal.  Cardiovascular:     Rate and Rhythm: Normal rate and regular rhythm.     Pulses: Normal pulses.     Heart sounds: Normal heart sounds.  Pulmonary:     Breath sounds: No wheezing, rhonchi or rales.  Abdominal:     Palpations: Abdomen is soft.  Musculoskeletal:     Cervical back: Normal range of motion and neck supple.  Skin:    Findings: No rash.  Neurological:     Mental Status: He is alert. Mental status is at baseline.     (all labs ordered are listed, but only abnormal results are displayed) Labs Reviewed  RESP PANEL BY RT-PCR (RSV, FLU A&B, COVID)  [id number]    EKG: None  Radiology:  DG Chest 2 View Result Date: 08/16/2024 EXAM: 2 VIEW(S) XRAY OF THE CHEST 08/16/2024 06:24:00 PM COMPARISON: 03/20/2024 CLINICAL HISTORY: Cough. FINDINGS: LUNGS AND PLEURA: Low lung volumes. Patchy airspace opacity in left base, possibly representing atelectasis or superimposed infection. No pleural effusion. No pneumothorax. HEART AND MEDIASTINUM: Calcified aorta. No acute abnormality of the cardiac and mediastinal silhouettes. BONES AND SOFT TISSUES: Thoracic degenerative changes. No acute osseous abnormality. IMPRESSION: 1. Patchy airspace opacity in the left base, possibly representing atelectasis or superimposed infection. Electronically signed by: Franky Crease MD 08/16/2024 06:30 PM EST RP Workstation: [id number]     Procedures   Medications Ordered in the ED  acetaminophen  (TYLENOL ) tablet 1,000 mg (has no administration in time  range)  azithromycin  (ZITHROMAX ) tablet 500 mg (has no administration in time range)                                    Medical Decision Making Amount and/or Complexity of Data Reviewed Radiology: ordered.  Risk OTC drugs. Prescription drug management.   BP (!) 151/79 (BP Location: Right Arm)   Pulse 84   Temp (!) 100.7 F (38.2 C) (Oral)   Resp 18   SpO2 94%   13:60 PM  89 year old male with history of dementia, diabetes, CAD, A-fib presents to the ER accompanied by family member with concerns of cold symptoms.  Per wife who is at bedside, for more than a week patient has had cough, congestion, runny nose,.  PCP did prescribe him Mucinex .  Today he is coughing more and now running a fever.  No report of vomiting or diarrhea.  He has dementia.  He is on Eliquis .  On exam patient is sitting in bed, sleeping but arousable.  In no acute discomfort.  Slightly diminished lung sounds with no wheezes rales or rhonchi heard.  Abdomen is soft nontender.  Vitals are notable for fever of 100.7.  Tylenol  given.  No hypoxia.  No hypotension.  Chest x-ray obtained which shows possible pneumonia in the left lung base versus atelectasis.  Agree with radiology interpretation.  Suspect patient initially had a viral illness that leads to superimposed bacterial infection.  Since patient is allergic to penicillin, will treat with Z-Pak, Tylenol , and Tessalon .  Hospital admission considered but patient is not hypoxic and due to his dementia he is likely will develop sundowning when staying in an unfamiliar environment.      Final diagnoses:  Pneumonia of left lower lobe due to infectious organism    ED Discharge Orders          Ordered    azithromycin  (ZITHROMAX  Z-PAK) 250 MG tablet        08/16/24 2012    benzonatate  (TESSALON ) 100 MG capsule  Every 8 hours        08/16/24 2012    acetaminophen  (TYLENOL ) 500 MG tablet  Every 6 hours PRN        08/16/24 2012               Nivia Colon,  PA-C 08/16/24 2016    Kingsley, Victoria K, DO 08/16/24 2322  "

## 2024-08-16 NOTE — Telephone Encounter (Signed)
 FYI Only or Action Required?: Action required by provider: request for appointment.  Patient was last seen in primary care on 08/07/2024 by Gretta Comer POUR, NP.  Called Nurse Triage reporting Cough and Fever.  Symptoms began today.  Interventions attempted: OTC medications: tylenol .  Symptoms are: unchanged.  Triage Disposition: See Physician Within 24 Hours  Patient/caregiver understands and will follow disposition?: No, wishes to speak with PCP   Message from [facility] sent at 08/16/2024  2:07 PM EST  Reason for Triage: Cough persisting w/ sob; fever, headaches     Reason for Disposition  Fever present > 3 days (72 hours)  [1] Continuous (nonstop) coughing interferes with work or school AND [2] no improvement using cough treatment per Care Advice  Answer Assessment - Initial Assessment Questions No available appts today.  Advised UC today or ED/911 if symptoms occur/worsen: severe diff breathing, chest pain > 5 min, faint. Patient's wife verbalized understanding.   Patient's wife requesting appt, concerns of pneumonia and requests chest xray.  Patient's wife reports and calls on behalf of patient; hx dementia 1. ONSET: When did the cough begin?      Week ago 2. SEVERITY: How bad is the cough today?      Ongoing cough, low grade fever, HA 3. SPUTUM: Describe the color of your sputum (e.g., none, dry cough; clear, white, yellow, green)    Not able to spit out 4. HEMOPTYSIS: Are you coughing up any blood? If Yes, ask: How much? (e.g., flecks, streaks, tablespoons, etc.)     no 5. DIFFICULTY BREATHING: Are you having difficulty breathing? If Yes, ask: How bad is it? (e.g., mild, moderate, severe)      denies 6. FEVER: Do you have a fever? If Yes, ask: What is your temperature, how was it measured, and when did it start?     Low grade, n/v/d 7. CARDIAC HISTORY: Do you have any history of heart disease? (e.g., heart attack, congestive heart failure)       no 8. LUNG HISTORY: Do you have any history of lung disease?  (e.g., pulmonary embolus, asthma, emphysema)     no 9. PE RISK FACTORS: Do you have a history of blood clots? (or: recent major surgery, recent prolonged travel, bedridden)     no 10. OTHER SYMPTOMS: Do you have any other symptoms? (e.g., runny nose, wheezing, chest pain)    Runny nose, clear; denies diff breathing, sob, chest pain, faint, weakness, chills, n/v  Answer Assessment - Initial Assessment Questions 1. TEMPERATURE: What is the most recent temperature?  How was it measured?      99.4 2. ONSET: When did the fever start?      today 3. CHILLS: Do you have chills? If yes: How bad are they?  (e.g., none, mild, moderate, severe)     denies 4. OTHER SYMPTOMS: Do you have any other symptoms besides the fever?  (e.g., abdomen pain, cough, diarrhea, earache, headache, sore throat, urination pain)     denies 5. CAUSE: If there are no symptoms, ask: What do you think is causing the fever?    cough      7. TREATMENT: What have you done so far to treat this fever? (e.g., OTC fever medicines)     tylenol  8. IMMUNOCOMPROMISE: Do you have any of the following: diabetes, HIV positive, splenectomy, cancer chemotherapy, chronic steroid treatment, transplant patient, etc.?   exposures)       denies  Protocols used: Cough - Acute Productive-A-AH, Fever-A-AH

## 2024-08-16 NOTE — ED Triage Notes (Signed)
 Pt demented , s/o at bedside reports cough, congestion, fever, runny nose x 2 weeks, seen by PCP who gave mucinex  pt was not tested for covid/flu/rsv wife reports.

## 2024-08-16 NOTE — Telephone Encounter (Signed)
 Please advise you have seen patient but you are not listed as pcp in chart.

## 2024-08-16 NOTE — Telephone Encounter (Signed)
 Please call patient's wife:  Per our discussion earlier this week she mentioned that Alexander Donaldson was improving.   What are his current symptoms? Any changes in appetite? How about mental status? Confusion?

## 2024-08-16 NOTE — Discharge Instructions (Addendum)
 You have symptoms consistent with pneumonia.  Please take antibiotic as prescribed.  Take Tessalon  as needed for cough.  Take Tylenol  for fever.  Follow-up closely with your primary care doctor for further care.  Return if you have any concern.

## 2024-09-02 ENCOUNTER — Ambulatory Visit: Admitting: Podiatry

## 2025-02-24 ENCOUNTER — Ambulatory Visit
